# Patient Record
Sex: Male | Born: 1970 | Race: Black or African American | Hispanic: No | Smoking: Current every day smoker
Health system: Southern US, Community
[De-identification: ages and names within clinical notes are randomized; demographics above are authoritative.]

## PROBLEM LIST (undated history)

## (undated) DIAGNOSIS — M542 Cervicalgia: Secondary | ICD-10-CM

## (undated) DIAGNOSIS — I509 Heart failure, unspecified: Secondary | ICD-10-CM

## (undated) DIAGNOSIS — J449 Chronic obstructive pulmonary disease, unspecified: Secondary | ICD-10-CM

## (undated) DIAGNOSIS — F1010 Alcohol abuse, uncomplicated: Secondary | ICD-10-CM

## (undated) DIAGNOSIS — I10 Essential (primary) hypertension: Secondary | ICD-10-CM

## (undated) DIAGNOSIS — E785 Hyperlipidemia, unspecified: Secondary | ICD-10-CM

## (undated) DIAGNOSIS — R569 Unspecified convulsions: Secondary | ICD-10-CM

## (undated) HISTORY — PX: CHOLECYSTECTOMY: SHX55

## (undated) HISTORY — DX: Essential (primary) hypertension: I10

## (undated) HISTORY — PX: TONSILLECTOMY: SUR1361

## (undated) HISTORY — DX: Cervicalgia: M54.2

## (undated) HISTORY — DX: Alcohol abuse, uncomplicated: F10.10

## (undated) HISTORY — DX: Heart failure, unspecified: I50.9

## (undated) HISTORY — DX: Hyperlipidemia, unspecified: E78.5

## (undated) SURGERY — ECHOCARDIOGRAM, TRANSESOPHAGEAL
Anesthesia: Moderate Sedation

---

## 2004-12-05 ENCOUNTER — Emergency Department: Payer: Self-pay | Admitting: Unknown Physician Specialty

## 2006-10-04 ENCOUNTER — Emergency Department: Payer: Self-pay | Admitting: Emergency Medicine

## 2008-08-13 ENCOUNTER — Emergency Department: Payer: Self-pay | Admitting: Emergency Medicine

## 2010-01-22 ENCOUNTER — Emergency Department: Payer: Self-pay | Admitting: Emergency Medicine

## 2010-04-05 ENCOUNTER — Emergency Department: Payer: Self-pay | Admitting: Emergency Medicine

## 2010-06-25 ENCOUNTER — Ambulatory Visit: Payer: Self-pay | Admitting: Neurology

## 2011-05-15 ENCOUNTER — Emergency Department: Payer: Self-pay | Admitting: Unknown Physician Specialty

## 2013-04-08 ENCOUNTER — Ambulatory Visit: Payer: Self-pay

## 2013-04-22 DIAGNOSIS — M542 Cervicalgia: Secondary | ICD-10-CM | POA: Insufficient documentation

## 2015-04-30 DIAGNOSIS — G40909 Epilepsy, unspecified, not intractable, without status epilepticus: Secondary | ICD-10-CM

## 2015-04-30 DIAGNOSIS — E785 Hyperlipidemia, unspecified: Secondary | ICD-10-CM

## 2015-04-30 DIAGNOSIS — M545 Low back pain, unspecified: Secondary | ICD-10-CM | POA: Insufficient documentation

## 2015-04-30 DIAGNOSIS — F1010 Alcohol abuse, uncomplicated: Secondary | ICD-10-CM | POA: Insufficient documentation

## 2015-04-30 DIAGNOSIS — I10 Essential (primary) hypertension: Secondary | ICD-10-CM

## 2015-04-30 DIAGNOSIS — F17200 Nicotine dependence, unspecified, uncomplicated: Secondary | ICD-10-CM

## 2015-04-30 DIAGNOSIS — J449 Chronic obstructive pulmonary disease, unspecified: Secondary | ICD-10-CM | POA: Insufficient documentation

## 2015-04-30 DIAGNOSIS — R569 Unspecified convulsions: Secondary | ICD-10-CM | POA: Insufficient documentation

## 2015-04-30 DIAGNOSIS — G8929 Other chronic pain: Secondary | ICD-10-CM | POA: Insufficient documentation

## 2015-04-30 DIAGNOSIS — E7800 Pure hypercholesterolemia, unspecified: Secondary | ICD-10-CM | POA: Insufficient documentation

## 2015-04-30 DIAGNOSIS — J45909 Unspecified asthma, uncomplicated: Secondary | ICD-10-CM | POA: Insufficient documentation

## 2015-05-05 ENCOUNTER — Ambulatory Visit: Payer: Self-pay | Admitting: Unknown Physician Specialty

## 2015-07-07 ENCOUNTER — Other Ambulatory Visit: Payer: Self-pay | Admitting: Unknown Physician Specialty

## 2015-07-07 ENCOUNTER — Encounter: Payer: Self-pay | Admitting: Unknown Physician Specialty

## 2015-07-07 ENCOUNTER — Ambulatory Visit (INDEPENDENT_AMBULATORY_CARE_PROVIDER_SITE_OTHER): Payer: Managed Care, Other (non HMO) | Admitting: Unknown Physician Specialty

## 2015-07-07 VITALS — BP 137/84 | HR 91 | Temp 98.9°F | Ht 70.2 in | Wt 222.8 lb

## 2015-07-07 DIAGNOSIS — F17200 Nicotine dependence, unspecified, uncomplicated: Secondary | ICD-10-CM | POA: Diagnosis not present

## 2015-07-07 DIAGNOSIS — E785 Hyperlipidemia, unspecified: Secondary | ICD-10-CM

## 2015-07-07 DIAGNOSIS — I10 Essential (primary) hypertension: Secondary | ICD-10-CM | POA: Diagnosis not present

## 2015-07-07 LAB — LIPID PANEL PICCOLO, WAIVED
Chol/HDL Ratio Piccolo,Waive: 4.1 mg/dL
Cholesterol Piccolo, Waived: 233 mg/dL — ABNORMAL HIGH (ref <200)
HDL Chol Piccolo, Waived: 58 mg/dL (ref >59)
LDL Chol Calc Piccolo Waived: 103 mg/dL — ABNORMAL HIGH (ref <100)
Triglycerides Piccolo,Waived: 361 mg/dL — ABNORMAL HIGH (ref <150)
VLDL Chol Calc Piccolo,Waive: 72 mg/dL — ABNORMAL HIGH (ref <30)

## 2015-07-07 MED ORDER — LISINOPRIL-HYDROCHLOROTHIAZIDE 20-12.5 MG PO TABS: 1.0000 | ORAL_TABLET | Freq: Every day | ORAL | Status: DC

## 2015-07-07 MED ORDER — SIMVASTATIN 20 MG PO TABS: 20.0000 mg | ORAL_TABLET | Freq: Every day | ORAL | Status: DC

## 2015-07-07 NOTE — Progress Notes (Signed)
   BP 137/84 mmHg  Pulse 91  Temp(Src) 98.9 F (37.2 C)  Ht 5' 10.2" (1.783 m)  Wt 222 lb 12.8 oz (101.061 kg)  BMI 31.79 kg/m2  SpO2 98%   Subjective:    Patient ID: Jose Dike., male    DOB: 1971/04/15, 44 y.o.   MRN: [id number]  HPI: Jose Seubert. is a 44 y.o. male  Chief Complaint  Patient presents with  . Hyperlipidemia  . Hypertension   Hypertension/Hyperlipidemia: He was started on lisionpril/HCTZ20- 12.5mg  in May after experiencing an episode of dizziness and not being able to have dental work due to high blood pressure. He is compliant with medications. He states he is going to stop smoking today but may need some help. Cholesterol is improved today however he did not start a previous prescription for simvastatin due to no insurance. Will restart today.  Relevant past medical, surgical, family and social history reviewed and updated as indicated. Interim medical history since our last visit reviewed. Allergies and medications reviewed and updated.  Review of Systems  Constitutional: Negative.  Negative for fever, chills and fatigue.  HENT: Negative.   Respiratory: Negative.  Negative for cough, chest tightness, shortness of breath, wheezing and stridor.   Cardiovascular: Negative.  Negative for chest pain, palpitations and leg swelling.  Skin: Negative.  Negative for color change, pallor, rash and wound.  Psychiatric/Behavioral: Negative.  Negative for confusion, sleep disturbance and agitation. The patient is not nervous/anxious.     Per HPI unless specifically indicated above     Objective:    BP 137/84 mmHg  Pulse 91  Temp(Src) 98.9 F (37.2 C)  Ht 5' 10.2" (1.783 m)  Wt 222 lb 12.8 oz (101.061 kg)  BMI 31.79 kg/m2  SpO2 98%  Wt Readings from Last 3 Encounters:  07/07/15 222 lb 12.8 oz (101.061 kg)  04/07/15 222 lb (100.699 kg)    Physical Exam  Constitutional: He is oriented to person, place, and time. He appears well-developed and well-nourished. No  distress.  HENT:  Head: Normocephalic.  Cardiovascular: Normal rate and normal heart sounds.  Exam reveals no gallop and no friction rub.   No murmur heard. Pulmonary/Chest: Breath sounds normal. No respiratory distress. He has no wheezes. He has no rales. He exhibits no tenderness.  Neurological: He is alert and oriented to person, place, and time.  Skin: Skin is warm and dry. No rash noted. He is not diaphoretic. No erythema. No pallor.  Psychiatric: He has a normal mood and affect. His behavior is normal. Judgment and thought content normal.    No results found for this or any previous visit.    Assessment & Plan:   Problem List Items Addressed This Visit      Unprioritized   Hypertension    Controlled on Lisinopril/HCTZ -  20-12.5mg . Medication refill sent to pharmacy.      Relevant Medications   lisinopril-hydrochlorothiazide (PRINZIDE,ZESTORETIC) 20-12.5 MG per tablet   simvastatin (ZOCOR) 20 MG tablet   Tobacco dependence    Pt education on smoking cessation, benefits and resources.        Hyperlipidemia - Primary    Restarted on simvastatin 20mg  per ASCVD guidelines.      Relevant Medications   lisinopril-hydrochlorothiazide (PRINZIDE,ZESTORETIC) 20-12.5 MG per tablet   simvastatin (ZOCOR) 20 MG tablet   Other Relevant Orders   Lipid Panel Piccolo, Waived       Follow up plan: 6 months

## 2015-07-07 NOTE — Assessment & Plan Note (Signed)
Restarted on simvastatin 20mg  per ASCVD guidelines.

## 2015-07-07 NOTE — Assessment & Plan Note (Signed)
Pt education on smoking cessation, benefits and resources.

## 2015-07-07 NOTE — Assessment & Plan Note (Signed)
Controlled on Lisinopril/HCTZ -  20-12.5mg . Medication refill sent to pharmacy.

## 2015-07-08 ENCOUNTER — Other Ambulatory Visit: Payer: Self-pay | Admitting: Unknown Physician Specialty

## 2015-07-08 NOTE — Telephone Encounter (Signed)
Pt called stated he needs refill on Epitol stated the refill was supposed to be sent yesterday. Pharm is Pa[facility] nurse on [address]. Thanks.

## 2015-07-08 NOTE — Telephone Encounter (Signed)
Routing to provider  

## 2015-08-07 ENCOUNTER — Other Ambulatory Visit: Payer: Self-pay | Admitting: Unknown Physician Specialty

## 2016-06-24 ENCOUNTER — Ambulatory Visit: Payer: Managed Care, Other (non HMO) | Admitting: Unknown Physician Specialty

## 2016-06-28 ENCOUNTER — Ambulatory Visit (INDEPENDENT_AMBULATORY_CARE_PROVIDER_SITE_OTHER): Payer: Managed Care, Other (non HMO) | Admitting: Unknown Physician Specialty

## 2016-06-28 ENCOUNTER — Encounter: Payer: Self-pay | Admitting: Unknown Physician Specialty

## 2016-06-28 ENCOUNTER — Encounter: Payer: Self-pay | Admitting: Gastroenterology

## 2016-06-28 VITALS — BP 179/116 | HR 86 | Temp 98.7°F | Ht 70.5 in | Wt 228.2 lb

## 2016-06-28 DIAGNOSIS — K625 Hemorrhage of anus and rectum: Secondary | ICD-10-CM

## 2016-06-28 DIAGNOSIS — E785 Hyperlipidemia, unspecified: Secondary | ICD-10-CM

## 2016-06-28 DIAGNOSIS — I10 Essential (primary) hypertension: Secondary | ICD-10-CM | POA: Diagnosis not present

## 2016-06-28 DIAGNOSIS — Z800 Family history of malignant neoplasm of digestive organs: Secondary | ICD-10-CM | POA: Diagnosis not present

## 2016-06-28 DIAGNOSIS — E7800 Pure hypercholesterolemia, unspecified: Secondary | ICD-10-CM

## 2016-06-28 DIAGNOSIS — G40909 Epilepsy, unspecified, not intractable, without status epilepticus: Secondary | ICD-10-CM | POA: Diagnosis not present

## 2016-06-28 LAB — CBC WITH DIFFERENTIAL/PLATELET
Hematocrit: 35 % — ABNORMAL LOW (ref 37.5–51.0)
Hemoglobin: 11.6 g/dL — ABNORMAL LOW (ref 12.6–17.7)
Lymphocytes Absolute: 2.2 10*3/uL (ref 0.7–3.1)
Lymphs: 33 %
MCH: 27.1 pg (ref 26.6–33.0)
MCHC: 33.1 g/dL (ref 31.5–35.7)
MCV: 82 fL (ref 79–97)
MID (Absolute): 1 10*3/uL (ref 0.1–1.6)
MID: 15 %
Neutrophils Absolute: 3.7 10*3/uL (ref 1.4–7.0)
Neutrophils: 52 %
Platelets: 225 10*3/uL (ref 150–379)
RBC: 4.28 x10E6/uL (ref 4.14–5.80)
RDW: 19.2 % — ABNORMAL HIGH (ref 12.3–15.4)
WBC: 6.9 10*3/uL (ref 3.4–10.8)

## 2016-06-28 LAB — MICROALBUMIN, URINE WAIVED
Creatinine, Urine Waived: 300 mg/dL (ref 10–300)
Microalb, Ur Waived: 150 mg/L — ABNORMAL HIGH (ref 0–19)

## 2016-06-28 MED ORDER — SIMVASTATIN 20 MG PO TABS: 20.0000 mg | ORAL_TABLET | Freq: Every day | ORAL | 1 refills | Status: DC

## 2016-06-28 MED ORDER — LISINOPRIL-HYDROCHLOROTHIAZIDE 20-12.5 MG PO TABS: 1.0000 | ORAL_TABLET | Freq: Every day | ORAL | 1 refills | Status: DC

## 2016-06-28 MED ORDER — CARBAMAZEPINE 200 MG PO TABS: 200.0000 mg | ORAL_TABLET | Freq: Two times a day (BID) | ORAL | 3 refills | Status: DC

## 2016-06-28 NOTE — Assessment & Plan Note (Signed)
Severe at times.  Strong family history of colon cancer.  Refer to GI for further evaluation.  H/H is stable.

## 2016-06-28 NOTE — Assessment & Plan Note (Signed)
Restart medication

## 2016-06-28 NOTE — Assessment & Plan Note (Signed)
Restart Epitol

## 2016-06-28 NOTE — Progress Notes (Signed)
BP (!) 179/116 (BP Location: Left Arm, Cuff Size: Large)   Pulse 86   Temp 98.7 F (37.1 C)   Ht 5' 10.5" (1.791 m)   Wt 228 lb 3.2 oz (103.5 kg)   SpO2 96%   BMI 32.28 kg/m    Subjective:    Patient ID: Jose [name]., male    DOB: [birth year], 45 y.o.   MRN: [id number]  HPI: Revis Botz. is a 45 y.o. male  Chief Complaint  Patient presents with  . Hypertension  . Hyperlipidemia   Hypertension No medication for 2 1/2 weeks.   Average home BPs Not checking   No problems or lightheadedness other than once with bright sunlight No chest pain with exertion or shortness of breath No Edema   Hyperlipidemia Out of medications    No Muscle aches  Diet compliance: Exercise:  Rectal bleeding States he had heavy rectal bleeding 2 1/2 weeks ago.  States he has noticed some blood in stomach.  His mother was diagnosed with colon cancer at 70 and brother and great uncle died of it.  Several cousins also diagnosed.  Seizure disorder  Out of medication for over a year.  No seizures in the last year.  Last one 2 years ago.  Started when 16  Relevant past medical, surgical, family and social history reviewed and updated as indicated. Interim medical history since our last visit reviewed. Allergies and medications reviewed and updated.  Review of Systems  Per HPI unless specifically indicated above     Objective:    BP (!) 179/116 (BP Location: Left Arm, Cuff Size: Large)   Pulse 86   Temp 98.7 F (37.1 C)   Ht 5' 10.5" (1.791 m)   Wt 228 lb 3.2 oz (103.5 kg)   SpO2 96%   BMI 32.28 kg/m   Wt Readings from Last 3 Encounters:  06/28/16 228 lb 3.2 oz (103.5 kg)  07/07/15 222 lb 12.8 oz (101.1 kg)  04/07/15 222 lb (100.7 kg)    Physical Exam  Constitutional: He is oriented to person, place, and time. He appears well-developed and well-nourished. No distress.  HENT:  Head: Normocephalic and atraumatic.  Eyes: Conjunctivae and lids are normal. Right eye exhibits no  discharge. Left eye exhibits no discharge. No scleral icterus.  Neck: Normal range of motion. Neck supple. No JVD present. Carotid bruit is not present.  Cardiovascular: Normal rate, regular rhythm and normal heart sounds.   Pulmonary/Chest: Effort normal and breath sounds normal. No respiratory distress.  Abdominal: Normal appearance. There is no splenomegaly or hepatomegaly.  Musculoskeletal: Normal range of motion.  Neurological: He is alert and oriented to person, place, and time.  Skin: Skin is warm, dry and intact. No rash noted. No pallor.  Psychiatric: He has a normal mood and affect. His behavior is normal. Judgment and thought content normal.    Results for orders placed or performed in visit on 07/07/15  Lipid Panel Piccolo, [employer]  Result Value Ref Range   Cholesterol Piccolo, Waived 233 (H) <200 mg/dL   HDL Chol Piccolo, Waived 58 >59 mg/dL   Triglycerides Piccolo,Waived 361 (H) <150 mg/dL   Chol/HDL Ratio Piccolo,Waive 4.1 mg/dL   LDL Chol Calc Piccolo Waived 103 (H) <100 mg/dL   VLDL Chol Calc Piccolo,Waive 72 (H) <30 mg/dL      Assessment & Plan:   Problem List Items Addressed This Visit      Unprioritized   Epilepsy ([place])    Restart Epitol  Family history of colon cancer   Relevant Orders   Ambulatory referral to Gastroenterology   Hypercholesterolemia    Restart medication      Relevant Medications   lisinopril-hydrochlorothiazide (PRINZIDE,ZESTORETIC) 20-12.5 MG tablet   simvastatin (ZOCOR) 20 MG tablet   Hyperlipidemia    Very poor control today.  Restart medication.        Relevant Medications   lisinopril-hydrochlorothiazide (PRINZIDE,ZESTORETIC) 20-12.5 MG tablet   simvastatin (ZOCOR) 20 MG tablet   Other Relevant Orders   Lipid Panel w/o Chol/HDL Ratio   Microalbumin, Urine Waived   Uric acid   Hypertension - Primary   Relevant Medications   lisinopril-hydrochlorothiazide (PRINZIDE,ZESTORETIC) 20-12.5 MG tablet   simvastatin (ZOCOR)  20 MG tablet   Other Relevant Orders   Comprehensive metabolic panel   Rectal bleeding    Severe at times.  Strong family history of colon cancer.  Refer to GI for further evaluation.  H/H is stable.        Relevant Orders   CBC With Differential/Platelet   Ambulatory referral to Gastroenterology    Other Visit Diagnoses   None.      Follow up plan: Return in about 4 weeks (around 07/26/2016).

## 2016-06-28 NOTE — Assessment & Plan Note (Signed)
Very poor control today.  Restart medication.

## 2016-06-29 ENCOUNTER — Encounter: Payer: Self-pay | Admitting: Unknown Physician Specialty

## 2016-06-29 ENCOUNTER — Telehealth: Payer: Self-pay | Admitting: Gastroenterology

## 2016-06-29 LAB — COMPREHENSIVE METABOLIC PANEL
ALT: 11 IU/L (ref 0–44)
AST: 18 IU/L (ref 0–40)
Albumin/Globulin Ratio: 1.3 (ref 1.2–2.2)
Albumin: 3.9 g/dL (ref 3.5–5.5)
Alkaline Phosphatase: 88 IU/L (ref 39–117)
BUN/Creatinine Ratio: 14 (ref 9–20)
BUN: 11 mg/dL (ref 6–24)
Bilirubin Total: <0.2 mg/dL (ref 0.0–1.2)
CO2: 23 mmol/L (ref 18–29)
Calcium: 8.9 mg/dL (ref 8.7–10.2)
Chloride: 104 mmol/L (ref 96–106)
Creatinine, Ser: 0.8 mg/dL (ref 0.76–1.27)
GFR calc Af Amer: 124 mL/min/{1.73_m2} (ref >59)
GFR calc non Af Amer: 107 mL/min/{1.73_m2} (ref >59)
Globulin, Total: 3.1 g/dL (ref 1.5–4.5)
Glucose: 92 mg/dL (ref 65–99)
Potassium: 3.7 mmol/L (ref 3.5–5.2)
Sodium: 144 mmol/L (ref 134–144)
Total Protein: 7 g/dL (ref 6.0–8.5)

## 2016-06-29 LAB — LIPID PANEL W/O CHOL/HDL RATIO
Cholesterol, Total: 211 mg/dL — ABNORMAL HIGH (ref 100–199)
HDL: 47 mg/dL (ref >39)
LDL Calculated: 139 mg/dL — ABNORMAL HIGH (ref 0–99)
Triglycerides: 124 mg/dL (ref 0–149)
VLDL Cholesterol Cal: 25 mg/dL (ref 5–40)

## 2016-06-29 LAB — URIC ACID: Uric Acid: 7.2 mg/dL (ref 3.7–8.6)

## 2016-07-01 NOTE — Telephone Encounter (Signed)
The pt has been moved to Dr Carlean Purl for 07/18/16, Kristin Bruins will notify the pt of the appt change.

## 2016-07-01 NOTE — Telephone Encounter (Signed)
The pt has a strong family history of colon cancer.  H/H is stable.  Hgb is slightly low at 11.6 he has an appt for 09/05/16 with Dr Ardis Hughs.  [facility] has not other labs to compare to.  He has some rectal bleeding and has a follow up in 2 weeks with PCP for repeat labs.  The pt has been added to the wait list and future labs will be sent to our office for upcoming appt.  Please advise if pt is ok to wait until appt scheduled with Dr Ardis Hughs.

## 2016-07-01 NOTE — Telephone Encounter (Signed)
Please see if there are any sooner available appts with any MD provider before 10/30 and offer to the patient.

## 2016-07-06 ENCOUNTER — Inpatient Hospital Stay: Payer: Managed Care, Other (non HMO) | Admitting: Family Medicine

## 2016-07-13 ENCOUNTER — Ambulatory Visit (INDEPENDENT_AMBULATORY_CARE_PROVIDER_SITE_OTHER): Payer: Managed Care, Other (non HMO) | Admitting: Family Medicine

## 2016-07-13 ENCOUNTER — Encounter: Payer: Self-pay | Admitting: Family Medicine

## 2016-07-13 VITALS — BP 144/92 | HR 82 | Temp 98.8°F | Wt 222.0 lb

## 2016-07-13 DIAGNOSIS — I10 Essential (primary) hypertension: Secondary | ICD-10-CM | POA: Diagnosis not present

## 2016-07-13 DIAGNOSIS — F17200 Nicotine dependence, unspecified, uncomplicated: Secondary | ICD-10-CM

## 2016-07-13 DIAGNOSIS — F1010 Alcohol abuse, uncomplicated: Secondary | ICD-10-CM | POA: Diagnosis not present

## 2016-07-13 DIAGNOSIS — S0990XD Unspecified injury of head, subsequent encounter: Secondary | ICD-10-CM | POA: Diagnosis not present

## 2016-07-13 MED ORDER — AMLODIPINE BESYLATE 5 MG PO TABS: 5.0000 mg | ORAL_TABLET | Freq: Every day | ORAL | 1 refills | Status: DC

## 2016-07-13 NOTE — Assessment & Plan Note (Signed)
Continue lisinopril HCTZ, add amlodipine. Has appt scheduled for a few weeks from now already, can recheck BP at this time.

## 2016-07-13 NOTE — Assessment & Plan Note (Signed)
Working on quitting with help from his personal support system. He knows to let us know if he needs any additional support in this process

## 2016-07-13 NOTE — Patient Instructions (Addendum)
Follow up as scheduled.  

## 2016-07-13 NOTE — Assessment & Plan Note (Signed)
Is planning to try to quit smoking in the near future. Has a quit hotline number that he wants to use for support.

## 2016-07-13 NOTE — Progress Notes (Signed)
BP (!) 144/92   Pulse 82   Temp 98.8 F (37.1 C)   Wt 222 lb (100.7 kg)   SpO2 97%   BMI 31.40 kg/m    Subjective:    Patient ID: Jose Dike., male    DOB: 02/21/1971, 45 y.o.   MRN: [id number]  HPI: Jose Cord. is a 45 y.o. male  Chief Complaint  Patient presents with  . Hospitalization Follow-up    s/p fall down stairs, had a head laceration. eval for staple removal.  . Alcohol Problem   Patient presents for hospital follow up regarding a fall down a flight of stairs with head laceration. He had staples placed on the largest laceration. Wounds are healing well, dressing them with bactroban. Denies dizziness, memory issues, N/V, or visual issues. Still having hearing loss on right side but was told that should come back over time.  BPs have been running high lately, notably in the hospital. Taking lisinopril HCTZ faithfully and without side effects.  Long-standing issue with alcohol and cigarette abuse, patient is at a place now where he is ready for a life change and seeking support from his personal network to get better.   Relevant past medical, surgical, family and social history reviewed and updated as indicated. Interim medical history since our last visit reviewed. Allergies and medications reviewed and updated.  Review of Systems  Constitutional: Negative.   HENT: Positive for hearing loss.   Respiratory: Negative.   Cardiovascular: Negative.   Gastrointestinal: Negative.   Genitourinary: Negative.   Musculoskeletal: Positive for arthralgias and myalgias.  Skin: Positive for wound.  Neurological: Negative.   Psychiatric/Behavioral: Negative.     Per HPI unless specifically indicated above     Objective:    BP (!) 144/92   Pulse 82   Temp 98.8 F (37.1 C)   Wt 222 lb (100.7 kg)   SpO2 97%   BMI 31.40 kg/m   Wt Readings from Last 3 Encounters:  07/13/16 222 lb (100.7 kg)  06/28/16 228 lb 3.2 oz (103.5 kg)  07/07/15 222 lb 12.8 oz (101.1 kg)      Physical Exam  Constitutional: He is oriented to person, place, and time. He appears well-developed and well-nourished. No distress.  HENT:  Head: Atraumatic.  Hearing diminished on right  Eyes: Conjunctivae are normal. No scleral icterus.  Neck: Normal range of motion. Neck supple.  Cardiovascular: Normal rate.   Pulmonary/Chest: Effort normal. No respiratory distress.  Musculoskeletal: Normal range of motion.  Neurological: He is alert and oriented to person, place, and time. No cranial nerve deficit. He exhibits normal muscle tone.  Skin: Skin is warm and dry.  Wounds on head healing well with no purulent drainage or erythema.  Staples on scalp intact, laceration healing well  Psychiatric: He has a normal mood and affect. His behavior is normal.  Nursing note and vitals reviewed.     Assessment & Plan:   Problem List Items Addressed This Visit      Cardiovascular and Mediastinum   Hypertension    Continue lisinopril HCTZ, add amlodipine. Has appt scheduled for a few weeks from now already, can recheck BP at this time.       Relevant Medications   amLODipine (NORVASC) 5 MG tablet     Other   ETOH abuse    Working on quitting with help from his personal support system. He knows to let us know if he needs any additional support in this process  Tobacco dependence    Is planning to try to quit smoking in the near future. Has a quit hotline number that he wants to use for support.        Other Visit Diagnoses    Head injury, subsequent encounter    -  Primary   Recovering well, continue bactroban on wounds. Staples can be removed at [facility] f/u Monday.        Follow up plan: Return for as scheduled, BP check.

## 2016-07-18 ENCOUNTER — Ambulatory Visit: Payer: Self-pay | Admitting: Internal Medicine

## 2016-07-21 ENCOUNTER — Ambulatory Visit: Payer: Managed Care, Other (non HMO) | Admitting: Family Medicine

## 2016-07-21 ENCOUNTER — Encounter: Payer: Self-pay | Admitting: Family Medicine

## 2016-07-21 ENCOUNTER — Ambulatory Visit (INDEPENDENT_AMBULATORY_CARE_PROVIDER_SITE_OTHER): Payer: Managed Care, Other (non HMO) | Admitting: Family Medicine

## 2016-07-21 VITALS — BP 145/96 | HR 88 | Temp 98.6°F | Ht 69.5 in | Wt 222.8 lb

## 2016-07-21 DIAGNOSIS — Z4802 Encounter for removal of sutures: Secondary | ICD-10-CM | POA: Diagnosis not present

## 2016-07-21 DIAGNOSIS — I10 Essential (primary) hypertension: Secondary | ICD-10-CM

## 2016-07-21 NOTE — Progress Notes (Signed)
BP (!) 145/96 (BP Location: Left Arm, Cuff Size: Large)   Pulse 88   Temp 98.6 F (37 C)   Ht 5' 9.5" (1.765 m)   Wt 222 lb 12.8 oz (101.1 kg)   SpO2 96%   BMI 32.43 kg/m    Subjective:    Patient ID: Jose Dike., male    DOB: August 12, 1971, 45 y.o.   MRN: [id number]  HPI: Jose Wildy. is a 45 y.o. male  Chief Complaint  Patient presents with  . Suture / Staple Removal   Patient presents s/p hospitalization for fall down stairs for staple removal, right scalp. Staples placed 8/27 in ER. Patient has been performing appropriate wound care and has not had any issues with the area. No HAs, dizziness, or N/V since accident.   Elevated BP discussed at previous visit, and amlodipine was ordered. He forgot to pick the medicine up and also forgot to take his other BP medication this morning and has been rushing around.   Relevant past medical, surgical, family and social history reviewed and updated as indicated. Interim medical history since our last visit reviewed. Allergies and medications reviewed and updated.  Review of Systems  Constitutional: Negative.   HENT: Positive for hearing loss.   Respiratory: Negative.   Cardiovascular: Negative.   Gastrointestinal: Negative.   Genitourinary: Negative.   Musculoskeletal: Negative.   Skin: Positive for wound.  Neurological: Negative.   Psychiatric/Behavioral: Negative.     Per HPI unless specifically indicated above     Objective:    BP (!) 145/96 (BP Location: Left Arm, Cuff Size: Large)   Pulse 88   Temp 98.6 F (37 C)   Ht 5' 9.5" (1.765 m)   Wt 222 lb 12.8 oz (101.1 kg)   SpO2 96%   BMI 32.43 kg/m   Wt Readings from Last 3 Encounters:  07/21/16 222 lb 12.8 oz (101.1 kg)  07/13/16 222 lb (100.7 kg)  06/28/16 228 lb 3.2 oz (103.5 kg)    Physical Exam  Constitutional: He is oriented to person, place, and time. He appears well-developed and well-nourished.  HENT:  Right Ear: External ear normal.  Left Ear: External  ear normal.  Eyes: Conjunctivae are normal. Pupils are equal, round, and reactive to light. No scleral icterus.  Neck: Normal range of motion. Neck supple.  Cardiovascular: Normal rate and normal heart sounds.   Pulmonary/Chest: Effort normal. No respiratory distress.  Musculoskeletal: Normal range of motion.  Neurological: He is alert and oriented to person, place, and time.  Skin: Skin is warm and dry.  Scalp lac well healed, 5 staples intact  No evidence of cellulitis  Psychiatric: He has a normal mood and affect. His behavior is normal.  Nursing note and vitals reviewed.       Assessment & Plan:   Problem List Items Addressed This Visit      Cardiovascular and Mediastinum   Hypertension    Discussed importance of picking up the amlodipine right away so we have an accurate picture of his response by his follow up in a few weeks. Continue lisinopril HCTZ.        Other Visit Diagnoses    Encounter for staple removal    -  Primary   Staples removed from right scalp with no immediate complications. Procedure was well tolerated. Area was dressed with neosporin and bandage.        Follow up plan: Return for as scheduled for BP recheck.

## 2016-07-21 NOTE — Patient Instructions (Signed)
Follow up as scheduled.  

## 2016-07-21 NOTE — Assessment & Plan Note (Signed)
Discussed importance of picking up the amlodipine right away so we have an accurate picture of his response by his follow up in a few weeks. Continue lisinopril HCTZ.

## 2016-08-01 ENCOUNTER — Ambulatory Visit: Payer: Managed Care, Other (non HMO) | Admitting: Unknown Physician Specialty

## 2016-09-05 ENCOUNTER — Ambulatory Visit: Payer: Self-pay | Admitting: Gastroenterology

## 2016-09-27 ENCOUNTER — Ambulatory Visit: Payer: Self-pay | Admitting: Internal Medicine

## 2016-12-18 ENCOUNTER — Other Ambulatory Visit: Payer: Self-pay | Admitting: Unknown Physician Specialty

## 2016-12-19 ENCOUNTER — Other Ambulatory Visit: Payer: Self-pay | Admitting: Unknown Physician Specialty

## 2016-12-28 ENCOUNTER — Other Ambulatory Visit: Payer: Self-pay | Admitting: Unknown Physician Specialty

## 2016-12-29 NOTE — Telephone Encounter (Signed)
Needs seen

## 2016-12-29 NOTE — Telephone Encounter (Signed)
Letter generated and sent to patient.  

## 2017-01-09 ENCOUNTER — Encounter: Payer: Self-pay | Admitting: Unknown Physician Specialty

## 2017-01-09 ENCOUNTER — Ambulatory Visit (INDEPENDENT_AMBULATORY_CARE_PROVIDER_SITE_OTHER): Payer: Managed Care, Other (non HMO) | Admitting: Unknown Physician Specialty

## 2017-01-09 DIAGNOSIS — E785 Hyperlipidemia, unspecified: Secondary | ICD-10-CM | POA: Diagnosis not present

## 2017-01-09 DIAGNOSIS — I10 Essential (primary) hypertension: Secondary | ICD-10-CM | POA: Diagnosis not present

## 2017-01-09 DIAGNOSIS — F17200 Nicotine dependence, unspecified, uncomplicated: Secondary | ICD-10-CM | POA: Diagnosis not present

## 2017-01-09 MED ORDER — AMLODIPINE BESYLATE 5 MG PO TABS: 5.0000 mg | ORAL_TABLET | Freq: Every day | ORAL | 1 refills | Status: DC

## 2017-01-09 MED ORDER — CARBAMAZEPINE 200 MG PO TABS: 200.0000 mg | ORAL_TABLET | Freq: Two times a day (BID) | ORAL | 1 refills | Status: DC

## 2017-01-09 MED ORDER — SIMVASTATIN 20 MG PO TABS: 20.0000 mg | ORAL_TABLET | Freq: Every day | ORAL | 1 refills | Status: DC

## 2017-01-09 MED ORDER — LISINOPRIL-HYDROCHLOROTHIAZIDE 20-12.5 MG PO TABS: 1.0000 | ORAL_TABLET | Freq: Every day | ORAL | 1 refills | Status: DC

## 2017-01-09 NOTE — Patient Instructions (Signed)
Quit smoking class  [phone] 

## 2017-01-09 NOTE — Progress Notes (Signed)
BP (!) 161/98 (BP Location: Left Arm, Cuff Size: Large)   Pulse 90   Temp 99.2 F (37.3 C)   Ht 5' 10.2" (1.783 m) Comment: pt had boots on  Wt 233 lb 11.2 oz (106 kg) Comment: pt had boots on  SpO2 94%   BMI 33.34 kg/m    Subjective:    Patient ID: Jose Dike., male    DOB: Oct 24, 1971, 46 y.o.   MRN: [id number]  HPI: Jose Machin. is a 46 y.o. male  Chief Complaint  Patient presents with  . Hyperlipidemia  . Hypertension  . Medication Refill    pt states he needs all medications refilled, states he has been out of all of them for 2 days    Hypertension Out of medications for 3 days Average home BPs Not checking   No problems or lightheadedness No chest pain with exertion or shortness of breath No Edema   Hyperlipidemia Using medications without problems No Muscle aches  Diet compliance: lately eating more fruit.  Eats once a day Exercise: Constantly active at work  Smoking New objective is to quit.  Cut back to less than 1/2 a pack a day.   Relevant past medical, surgical, family and social history reviewed and updated as indicated. Interim medical history since our last visit reviewed. Allergies and medications reviewed and updated.  Review of Systems  Per HPI unless specifically indicated above     Objective:    BP (!) 161/98 (BP Location: Left Arm, Cuff Size: Large)   Pulse 90   Temp 99.2 F (37.3 C)   Ht 5' 10.2" (1.783 m) Comment: pt had boots on  Wt 233 lb 11.2 oz (106 kg) Comment: pt had boots on  SpO2 94%   BMI 33.34 kg/m   Wt Readings from Last 3 Encounters:  01/09/17 233 lb 11.2 oz (106 kg)  07/21/16 222 lb 12.8 oz (101.1 kg)  07/13/16 222 lb (100.7 kg)    Physical Exam  Constitutional: He is oriented to person, place, and time. He appears well-developed and well-nourished. No distress.  HENT:  Head: Normocephalic and atraumatic.  Eyes: Conjunctivae and lids are normal. Right eye exhibits no discharge. Left eye exhibits no discharge. No  scleral icterus.  Neck: Normal range of motion. Neck supple. No JVD present. Carotid bruit is not present.  Cardiovascular: Normal rate, regular rhythm and normal heart sounds.   Pulmonary/Chest: Effort normal and breath sounds normal. No respiratory distress.  Abdominal: Normal appearance. There is no splenomegaly or hepatomegaly.  Musculoskeletal: Normal range of motion.  Neurological: He is alert and oriented to person, place, and time.  Skin: Skin is warm, dry and intact. No rash noted. No pallor.  Psychiatric: He has a normal mood and affect. His behavior is normal. Judgment and thought content normal.    Results for orders placed or performed in visit on 06/28/16  Comprehensive metabolic panel  Result Value Ref Range   Glucose 92 65 - 99 mg/dL   BUN 11 6 - 24 mg/dL   Creatinine, Ser 0.80 0.76 - 1.27 mg/dL   GFR calc non Af Amer 107 >59 mL/min/1.73   GFR calc Af Amer 124 >59 mL/min/1.73   BUN/Creatinine Ratio 14 9 - 20   Sodium 144 134 - 144 mmol/L   Potassium 3.7 3.5 - 5.2 mmol/L   Chloride 104 96 - 106 mmol/L   CO2 23 18 - 29 mmol/L   Calcium 8.9 8.7 - 10.2 mg/dL  Total Protein 7.0 6.0 - 8.5 g/dL   Albumin 3.9 3.5 - 5.5 g/dL   Globulin, Total 3.1 1.5 - 4.5 g/dL   Albumin/Globulin Ratio 1.3 1.2 - 2.2   Bilirubin Total <0.2 0.0 - 1.2 mg/dL   Alkaline Phosphatase 88 39 - 117 IU/L   AST 18 0 - 40 IU/L   ALT 11 0 - 44 IU/L  Lipid Panel w/o Chol/HDL Ratio  Result Value Ref Range   Cholesterol, Total 211 (H) 100 - 199 mg/dL   Triglycerides 124 0 - 149 mg/dL   HDL 47 >39 mg/dL   VLDL Cholesterol Cal 25 5 - 40 mg/dL   LDL Calculated 139 (H) 0 - 99 mg/dL  Microalbumin, Urine Waived  Result Value Ref Range   Microalb, Ur Waived 150 (H) 0 - 19 mg/L   Creatinine, Urine Waived 300 10 - 300 mg/dL   Microalb/Creat Ratio 30-300 (H) <30 mg/g  Uric acid  Result Value Ref Range   Uric Acid 7.2 3.7 - 8.6 mg/dL  CBC With Differential/Platelet  Result Value Ref Range   WBC 6.9 3.4  - 10.8 x10E3/uL   RBC 4.28 4.14 - 5.80 x10E6/uL   Hemoglobin 11.6 (L) 12.6 - 17.7 g/dL   Hematocrit 35.0 (L) 37.5 - 51.0 %   MCV 82 79 - 97 fL   MCH 27.1 26.6 - 33.0 pg   MCHC 33.1 31.5 - 35.7 g/dL   RDW 19.2 (H) 12.3 - 15.4 %   Platelets 225 150 - 379 x10E3/uL   Neutrophils 52 %   Lymphs 33 %   MID 15 %   Neutrophils Absolute 3.7 1.4 - 7.0 x10E3/uL   Lymphocytes Absolute 2.2 0.7 - 3.1 x10E3/uL   MID (Absolute) 1.0 0.1 - 1.6 X10E3/uL      Assessment & Plan:   Problem List Items Addressed This Visit      Unprioritized   Hyperlipidemia    Restart meds.  Recheck 1 month      Relevant Medications   amLODipine (NORVASC) 5 MG tablet   simvastatin (ZOCOR) 20 MG tablet   lisinopril-hydrochlorothiazide (PRINZIDE,ZESTORETIC) 20-12.5 MG tablet   Hypertension    Restart meds.  Recheck in 1 month       Relevant Medications   amLODipine (NORVASC) 5 MG tablet   simvastatin (ZOCOR) 20 MG tablet   lisinopril-hydrochlorothiazide (PRINZIDE,ZESTORETIC) 20-12.5 MG tablet   Tobacco dependence    Working on quitting.  Quit smoking class number given.            Follow up plan: Return in about 4 weeks (around 02/06/2017).

## 2017-01-09 NOTE — Assessment & Plan Note (Signed)
Working on quitting.  Quit smoking class number given.

## 2017-01-09 NOTE — Assessment & Plan Note (Addendum)
Restart meds.  Recheck in 1 month

## 2017-01-09 NOTE — Assessment & Plan Note (Signed)
Restart meds.  Recheck 1 month

## 2017-02-07 ENCOUNTER — Ambulatory Visit: Payer: Managed Care, Other (non HMO) | Admitting: Unknown Physician Specialty

## 2017-03-20 ENCOUNTER — Other Ambulatory Visit: Payer: Self-pay | Admitting: Unknown Physician Specialty

## 2017-03-20 MED ORDER — CARBAMAZEPINE 200 MG PO TABS: 200.0000 mg | ORAL_TABLET | Freq: Two times a day (BID) | ORAL | 1 refills | Status: DC

## 2017-03-20 MED ORDER — LISINOPRIL-HYDROCHLOROTHIAZIDE 20-12.5 MG PO TABS: 1.0000 | ORAL_TABLET | Freq: Every day | ORAL | 1 refills | Status: DC

## 2017-03-20 NOTE — Telephone Encounter (Signed)
Patient called in regards to requesting a medication refill for lisinopril and epitol. Patient wanted to know if he needs to make an appointment or if he can just have his medications refilled.   Please Advise.  Thank you.

## 2017-03-20 NOTE — Telephone Encounter (Signed)
Routing to provider  

## 2017-03-20 NOTE — Telephone Encounter (Signed)
According to my note, he was supposed to have a 4 week f/u?Marland Kitchen  But here are his refills

## 2017-03-21 NOTE — Telephone Encounter (Signed)
Letter generated and sent to patient about follow up appointment.

## 2017-06-09 ENCOUNTER — Other Ambulatory Visit: Payer: Self-pay | Admitting: Unknown Physician Specialty

## 2017-06-09 MED ORDER — CARBAMAZEPINE 200 MG PO TABS: 200.0000 mg | ORAL_TABLET | Freq: Two times a day (BID) | ORAL | 1 refills | Status: DC

## 2017-06-09 MED ORDER — LISINOPRIL-HYDROCHLOROTHIAZIDE 20-12.5 MG PO TABS: 1.0000 | ORAL_TABLET | Freq: Every day | ORAL | 1 refills | Status: DC

## 2017-06-09 NOTE — Telephone Encounter (Signed)
Routing to provider. Patient last seen 01/09/17.

## 2017-06-09 NOTE — Telephone Encounter (Signed)
Pt would like a refill for epitol and lisinopril sent to [facility] [place].

## 2017-07-07 ENCOUNTER — Telehealth: Payer: Self-pay | Admitting: Unknown Physician Specialty

## 2017-07-07 NOTE — Telephone Encounter (Signed)
Called pharmacy because according to chart, patient should still have a refill on both of these medications. Pharmacy stated that they do each have a refill and that they will get them ready for the patient. Will call and let patient know.

## 2017-07-07 NOTE — Telephone Encounter (Signed)
Called and let patient know that he still had a refill on his medications and that they were getting them ready for him.

## 2017-12-21 ENCOUNTER — Emergency Department
Admission: EM | Admit: 2017-12-21 | Discharge: 2017-12-21 | Disposition: A | Discharge status: Home / Self Care | Payer: Self-pay | Attending: Emergency Medicine | Admitting: Emergency Medicine

## 2017-12-21 ENCOUNTER — Other Ambulatory Visit: Payer: Self-pay

## 2017-12-21 ENCOUNTER — Encounter: Payer: Self-pay | Admitting: Emergency Medicine

## 2017-12-21 DIAGNOSIS — I10 Essential (primary) hypertension: Secondary | ICD-10-CM | POA: Insufficient documentation

## 2017-12-21 DIAGNOSIS — Z79899 Other long term (current) drug therapy: Secondary | ICD-10-CM | POA: Insufficient documentation

## 2017-12-21 DIAGNOSIS — J45909 Unspecified asthma, uncomplicated: Secondary | ICD-10-CM | POA: Insufficient documentation

## 2017-12-21 DIAGNOSIS — F17210 Nicotine dependence, cigarettes, uncomplicated: Secondary | ICD-10-CM | POA: Insufficient documentation

## 2017-12-21 DIAGNOSIS — K922 Gastrointestinal hemorrhage, unspecified: Secondary | ICD-10-CM | POA: Insufficient documentation

## 2017-12-21 DIAGNOSIS — F10920 Alcohol use, unspecified with intoxication, uncomplicated: Secondary | ICD-10-CM | POA: Insufficient documentation

## 2017-12-21 HISTORY — DX: Unspecified convulsions: R56.9

## 2017-12-21 LAB — CBC
HCT: 32.6 % — ABNORMAL LOW (ref 40.0–52.0)
Hemoglobin: 9.9 g/dL — ABNORMAL LOW (ref 13.0–18.0)
MCH: 20.8 pg — ABNORMAL LOW (ref 26.0–34.0)
MCHC: 30.3 g/dL — ABNORMAL LOW (ref 32.0–36.0)
MCV: 68.8 fL — ABNORMAL LOW (ref 80.0–100.0)
Platelets: 303 10*3/uL (ref 150–440)
RBC: 4.74 MIL/uL (ref 4.40–5.90)
RDW: 22.5 % — ABNORMAL HIGH (ref 11.5–14.5)
WBC: 4.3 10*3/uL (ref 3.8–10.6)

## 2017-12-21 LAB — BASIC METABOLIC PANEL
Anion gap: 14 (ref 5–15)
BUN: 11 mg/dL (ref 6–20)
CO2: 23 mmol/L (ref 22–32)
Calcium: 9.4 mg/dL (ref 8.9–10.3)
Chloride: 104 mmol/L (ref 101–111)
Creatinine, Ser: 0.75 mg/dL (ref 0.61–1.24)
GFR calc Af Amer: >60 mL/min (ref >60)
GFR calc non Af Amer: >60 mL/min (ref >60)
Glucose, Bld: 101 mg/dL — ABNORMAL HIGH (ref 65–99)
Potassium: 3.2 mmol/L — ABNORMAL LOW (ref 3.5–5.1)
Sodium: 141 mmol/L (ref 135–145)

## 2017-12-21 LAB — HEMOGLOBIN AND HEMATOCRIT, BLOOD
HCT: 31.8 % — ABNORMAL LOW (ref 40.0–52.0)
Hemoglobin: 9.8 g/dL — ABNORMAL LOW (ref 13.0–18.0)

## 2017-12-21 LAB — TROPONIN I: Troponin I: <0.03 ng/mL (ref <0.03)

## 2017-12-21 LAB — ETHANOL: Alcohol, Ethyl (B): 118 mg/dL — ABNORMAL HIGH (ref <10)

## 2017-12-21 MED ORDER — AMLODIPINE BESYLATE 5 MG PO TABS: 5.0000 mg | ORAL_TABLET | Freq: Every day | ORAL | 1 refills | Status: DC

## 2017-12-21 MED ORDER — CARBAMAZEPINE 200 MG PO TABS
200.0000 mg | ORAL_TABLET | Freq: Once | ORAL | Status: AC
  Administered 2017-12-21: 200 mg via ORAL
  Filled 2017-12-21: qty 1

## 2017-12-21 MED ORDER — HYDROCHLOROTHIAZIDE 12.5 MG PO CAPS
12.5000 mg | ORAL_CAPSULE | Freq: Every day | ORAL | Status: DC
  Administered 2017-12-21: 12.5 mg via ORAL
  Filled 2017-12-21: qty 1

## 2017-12-21 MED ORDER — HYDROCHLOROTHIAZIDE 12.5 MG PO TABS: 12.5000 mg | ORAL_TABLET | Freq: Every day | ORAL | 1 refills | Status: DC

## 2017-12-21 MED ORDER — SODIUM CHLORIDE 0.9 % IV BOLUS (SEPSIS)
1000.0000 mL | Freq: Once | INTRAVENOUS | Status: AC
  Administered 2017-12-21: 1000 mL via INTRAVENOUS

## 2017-12-21 MED ORDER — LISINOPRIL 10 MG PO TABS
20.0000 mg | ORAL_TABLET | Freq: Once | ORAL | Status: AC
Start: 2017-12-21 — End: 2017-12-21
  Administered 2017-12-21: 20 mg via ORAL
  Filled 2017-12-21: qty 2

## 2017-12-21 MED ORDER — AMLODIPINE BESYLATE 5 MG PO TABS
5.0000 mg | ORAL_TABLET | Freq: Once | ORAL | Status: AC
  Administered 2017-12-21: 5 mg via ORAL
  Filled 2017-12-21: qty 1

## 2017-12-21 MED ORDER — LISINOPRIL 10 MG PO TABS: 20.0000 mg | ORAL_TABLET | Freq: Every day | ORAL | 11 refills | Status: DC

## 2017-12-21 MED ORDER — CARBAMAZEPINE ER 200 MG PO TB12: 200.0000 mg | ORAL_TABLET | Freq: Two times a day (BID) | ORAL | 1 refills | Status: DC

## 2017-12-21 MED ORDER — AMLODIPINE BESYLATE 5 MG PO TABS
ORAL_TABLET | ORAL | Status: AC
  Administered 2017-12-21: 5 mg via ORAL
  Filled 2017-12-21: qty 1

## 2017-12-21 NOTE — Discharge Instructions (Signed)
Return to the emergency room if you develop black stools or bloody stools, if you feel dizzy, you develop chest pain, or if you develop bloody vomit. Otherwise follow-up with your primary care doctor in 1-2 days for reevaluation. Make sure to take your medications as prescribed.

## 2017-12-21 NOTE — ED Triage Notes (Signed)
Pt to ED via EMS from work, pt was found in car unresponsive, per EMS pt was A&Ox4 upon arrival. Pt hx of seizures, HTN. Pt noncompliant with meds x65month. Pt ETOH use q daily. MD at bedside. Pt A&Ox4, VS stable, speech clear

## 2017-12-21 NOTE — ED Provider Notes (Signed)
[facility] Emergency Department Provider Note  ____________________________________________  Time seen: Approximately 8:36 AM  I have reviewed the triage vital signs and the nursing notes.   HISTORY  Chief Complaint Loss of Consciousness  Level 5 caveat:  Portions of the history and physical were unable to be obtained due to confusion   HPI Jose [name]. is a 47 y.o. male with a history of alcohol abuse, hypertension, hyperlipidemia, and seizure disorder who presents after being found unresponsive in his car. Patient reports that he drinks every night and and drank last night. He hasn't been taking his medications for 2 months. Patient lost his wife this past December to sickle cell. His been very sad. He denies suicidal or homicidal ideation or history of depression. He reports that he ran out of his medications 2 months ago and due to a recent change of insurance and his job he still does not have his card and has been unable to afford his medications. This morning he was found in his car. Unclear if patient had lost consciousness or if he was sleeping. Patient was alert and oriented when he started to respond to EMS knocking on his window. Patient denies headache, nausea, vomiting, diarrhea going to display ago of shortness of breath, abdominal pain, fever, chills. Patient remembers driving to work but does not remember why he was in the car.  Past Medical History:  Diagnosis Date  . Alcohol abuse   . Cervicalgia   . Hyperlipidemia   . Hypertension   . Seizures [facility])     Patient Active Problem List   Diagnosis Date Noted  . Family history of colon cancer 06/28/2016  . Rectal bleeding 06/28/2016  . Seizures ([place]) 04/30/2015  . Hypertension 04/30/2015  . ETOH abuse 04/30/2015  . Epilepsy ([place]) 04/30/2015  . Tobacco dependence 04/30/2015  . COPD (chronic obstructive pulmonary disease) ([place]) 04/30/2015  . Asthma 04/30/2015  . Hyperlipidemia 04/30/2015   . Lumbago 04/30/2015  . Hypercholesterolemia 04/30/2015  . Chronic pain 04/30/2015    History reviewed. No pertinent surgical history.  Prior to Admission medications   Medication Sig Start Date End Date Taking? Authorizing Provider  amLODipine (NORVASC) 5 MG tablet Take 1 tablet (5 mg total) by mouth daily. 12/21/17 12/21/18  Rudene Re, MD  carbamazepine (TEGRETOL-XR) 200 MG 12 hr tablet Take 1 tablet (200 mg total) by mouth 2 (two) times daily. 12/21/17 12/21/18  Rudene Re, MD  hydrochlorothiazide (HYDRODIURIL) 12.5 MG tablet Take 1 tablet (12.5 mg total) by mouth daily. 12/21/17   Rudene Re, MD  lisinopril (PRINIVIL,ZESTRIL) 10 MG tablet Take 2 tablets (20 mg total) by mouth daily. 12/21/17 12/21/18  Rudene Re, MD  simvastatin (ZOCOR) 20 MG tablet Take 1 tablet (20 mg total) by mouth daily. Patient not taking: Reported on 12/21/2017 01/09/17   Kathrine Haddock, NP    Allergies Patient has no known allergies.  Family History  Problem Relation Age of Onset  . Diabetes Mother   . Hypertension Father   . Multiple sclerosis Father     Social History Social History   Tobacco Use  . Smoking status: Current Every Day Smoker    Packs/day: 0.50  . Smokeless tobacco: Former Network engineer Use Topics  . Alcohol use: Yes    Alcohol/week: 12.6 oz    Types: 14 Cans of beer, 7 Shots of liquor per week  . Drug use: Yes    Types: Marijuana    Review of Systems  Constitutional:  Negative for fever. + confusion Eyes: Negative for visual changes. ENT: Negative for sore throat. Neck: No neck pain  Cardiovascular: Negative for chest pain. Respiratory: Negative for shortness of breath. Gastrointestinal: Negative for abdominal pain, vomiting or diarrhea. Genitourinary: Negative for dysuria. Musculoskeletal: Negative for back pain. Skin: Negative for rash. Neurological: Negative for headaches, weakness or numbness. Psych: No SI or  HI  ____________________________________________   PHYSICAL EXAM:  VITAL SIGNS: ED Triage Vitals [12/21/17 0820]  Enc Vitals Group     BP (!) 181/110     Pulse Rate 100     Resp 20     Temp      Temp src      SpO2 98 %     Weight      Height      Head Circumference      Peak Flow      Pain Score 0     Pain Loc      Pain Edu?      Excl. in [place]?     Constitutional: Alert and oriented x 3. Well appearing and in no apparent distress. Patient smells of alcohol HEENT:      Head: Normocephalic and atraumatic.         Eyes: Conjunctivae are normal. Sclera is non-icteric.       Mouth/Throat: Mucous membranes are moist.       Neck: Supple with no signs of meningismus. Cardiovascular: Regular rate and rhythm. No murmurs, gallops, or rubs. 2+ symmetrical distal pulses are present in all extremities. No JVD. Respiratory: Normal respiratory effort. Lungs are clear to auscultation bilaterally. No wheezes, crackles, or rhonchi.  Gastrointestinal: Soft, non tender, and non distended with positive bowel sounds. No rebound or guarding. Musculoskeletal: Nontender with normal range of motion in all extremities. No edema, cyanosis, or erythema of extremities. Neurologic: Normal speech and language. Face is symmetric. Moving all extremities. No gross focal neurologic deficits are appreciated. Skin: Skin is warm, dry and intact. No rash noted. Psychiatric: Mood and affect are decreased, patient is teary. Speech and behavior are normal.  ____________________________________________   LABS (all labs ordered are listed, but only abnormal results are displayed)  Labs Reviewed  CBC - Abnormal; Notable for the following components:      Result Value   Hemoglobin 9.9 (*)    HCT 32.6 (*)    MCV 68.8 (*)    MCH 20.8 (*)    MCHC 30.3 (*)    RDW 22.5 (*)    All other components within normal limits  BASIC METABOLIC PANEL - Abnormal; Notable for the following components:   Potassium 3.2 (*)     Glucose, Bld 101 (*)    All other components within normal limits  ETHANOL - Abnormal; Notable for the following components:   Alcohol, Ethyl (B) 118 (*)    All other components within normal limits  HEMOGLOBIN AND HEMATOCRIT, BLOOD - Abnormal; Notable for the following components:   Hemoglobin 9.8 (*)    HCT 31.8 (*)    All other components within normal limits  TROPONIN I  URINE DRUG SCREEN, QUALITATIVE ([facility] ONLY)  URINALYSIS, COMPLETE ([facility]) WITH MICROSCOPIC   ____________________________________________  EKG  ED ECG REPORT I, Rudene Re, the attending physician, personally viewed and interpreted this ECG.  Normal sinus rhythm, rate of 95, normal intervals, normal axis, slight ST depressions on inferior and lateral leads which are present on prior EKG. No ST elevation. ____________________________________________  RADIOLOGY  none  ____________________________________________  PROCEDURES  Procedure(s) performed: None Procedures Critical Care performed:  None ____________________________________________   INITIAL IMPRESSION / ASSESSMENT AND PLAN / ED COURSE   47 y.o. male with a history of alcohol abuse, hypertension, hyperlipidemia, and seizure disorder who presents after being found unresponsive in his car. Patient is A&O x 3, smells of alcohol. Patient has been having hard time coping with recent loss of his wife. No SI or HI. Neuro intact. No signs of trauma. Ddx alcohol intoxication vs seizure. Labs pending. Will restart patient antihypertensive and antiseizure medications.  Clinical Course as of Dec 21 1341  Thu Dec 21, 2017  1307 Patient's hemoglobin is low at 9.9. Patient endorses black stool for the last week with the last episode being this morning. Patient is not on any blood thinners. Rectal exam done here shows  light brown stool guaiac positive. I recommended admission to the hospital for further management however patient is concerned that his  insurance might not be active and is concerned about the cost of admission. He says he will see his primary care doctor tomorrow for GI evaluation as an outpatient. He does have a history of colon cancer in his mother. At this time there is no evidence of melena or upper GI bleeding. I will repeat her H&H since he has been 4 hours from initial to make sure it is not dropping. No BM in the ED, no hematemesis.  [CV]    Clinical Course User Index [CV] Alfred Levins, [place], MD   _________________________ 1:42 PM on 12/21/2017 -----------------------------------------  Hemoglobin remained stable. Patient continues to prefer to go home at this time. Discussed return precautions for melena, hematemesis, coffee-ground emesis, or signs or symptoms of acute blood loss anemia. Recommend close follow-up with primary care doctor. Discussed the patient needs a colonoscopy especially with his family history of colon cancer. Patient can be discharged with his mother who is at the bedside.  As part of my medical decision making, I reviewed the following data within the [facility] notes reviewed and incorporated, Labs reviewed , EKG interpreted , Notes from prior ED visits and [place] Controlled Substance Database    Pertinent labs & imaging results that were available during my care of the patient were reviewed by me and considered in my medical decision making (see chart for details).    ____________________________________________   FINAL CLINICAL IMPRESSION(S) / ED DIAGNOSES  Final diagnoses:  Alcoholic intoxication without complication (HCC)  Lower GI bleed      NEW MEDICATIONS STARTED DURING THIS VISIT:  ED Discharge Orders        Ordered    amLODipine (NORVASC) 5 MG tablet  Daily     12/21/17 1339    carbamazepine (TEGRETOL-XR) 200 MG 12 hr tablet  2 times daily     12/21/17 1339    lisinopril (PRINIVIL,ZESTRIL) 10 MG tablet  Daily     12/21/17 1339     hydrochlorothiazide (HYDRODIURIL) 12.5 MG tablet  Daily     12/21/17 1339       Note:  This document was prepared using Dragon voice recognition software and may include unintentional dictation errors.    Alfred Levins, [place], MD 12/21/17 [phone]

## 2017-12-25 ENCOUNTER — Ambulatory Visit (INDEPENDENT_AMBULATORY_CARE_PROVIDER_SITE_OTHER): Payer: 59 | Admitting: Unknown Physician Specialty

## 2017-12-25 ENCOUNTER — Encounter: Payer: Self-pay | Admitting: Unknown Physician Specialty

## 2017-12-25 ENCOUNTER — Other Ambulatory Visit: Payer: Self-pay | Admitting: Unknown Physician Specialty

## 2017-12-25 VITALS — BP 121/72 | HR 101 | Temp 99.5°F | Ht 69.0 in | Wt 214.0 lb

## 2017-12-25 DIAGNOSIS — R569 Unspecified convulsions: Secondary | ICD-10-CM

## 2017-12-25 DIAGNOSIS — K625 Hemorrhage of anus and rectum: Secondary | ICD-10-CM | POA: Diagnosis not present

## 2017-12-25 DIAGNOSIS — F1010 Alcohol abuse, uncomplicated: Secondary | ICD-10-CM

## 2017-12-25 DIAGNOSIS — I10 Essential (primary) hypertension: Secondary | ICD-10-CM | POA: Diagnosis not present

## 2017-12-25 NOTE — Assessment & Plan Note (Signed)
Planning to start [place] meeting.  Not quite a commitment yet.  Recheck in one month

## 2017-12-25 NOTE — Progress Notes (Signed)
BP 121/72 (BP Location: Left Arm, Cuff Size: Large)   Pulse (!) 101   Temp 99.5 F (37.5 C) (Oral)   Ht 5\' 9"  (1.753 m)   Wt 214 lb (97.1 kg)   SpO2 95%   BMI 31.60 kg/m    Subjective:    Patient ID: Jose Dike., male    DOB: [birth year], 47 y.o.   MRN: [id number]  HPI: Jose Nuttall. is a 47 y.o. male  Chief Complaint  Patient presents with  . Hypertension  . Seizures   Pt is here with his mother who gives some of the history:   Pt presented to the ER on 2/14 after being found in his care intoxicated.  At the time, Jose [name] was found to have a Hgb 9.9 with guiac positive stools.  His mother says she notices bright red bleeding on his clothes.  Jose [name] states this last episode before the ER was clots of black stool  Hypertension High BP in the ER.  Restarted his medication. Not SOB.  No chest pain  Alcohol Pt states Jose [name] drinks one beer and three shots/day.  Discussed this last episode found in the car intoxicated.    Seizure disorder Has been years since last seizure, even when coming off of medication  Relevant past medical, surgical, family and social history reviewed and updated as indicated. Interim medical history since our last visit reviewed. Allergies and medications reviewed and updated.  Review of Systems  Per HPI unless specifically indicated above     Objective:    BP 121/72 (BP Location: Left Arm, Cuff Size: Large)   Pulse (!) 101   Temp 99.5 F (37.5 C) (Oral)   Ht 5\' 9"  (1.753 m)   Wt 214 lb (97.1 kg)   SpO2 95%   BMI 31.60 kg/m   Wt Readings from Last 3 Encounters:  12/25/17 214 lb (97.1 kg)  01/09/17 233 lb 11.2 oz (106 kg)  07/21/16 222 lb 12.8 oz (101.1 kg)    Physical Exam  Constitutional: Jose [name] is oriented to person, place, and time. Jose [name] appears well-developed and well-nourished. No distress.  HENT:  Head: Normocephalic and atraumatic.  Eyes: Conjunctivae and lids are normal. Right eye exhibits no discharge. Left eye exhibits no discharge. No scleral  icterus.  Neck: Normal range of motion. Neck supple. No JVD present. Carotid bruit is not present.  Cardiovascular: Normal rate, regular rhythm and normal heart sounds.  Pulmonary/Chest: Effort normal and breath sounds normal. No respiratory distress.  Abdominal: Soft. Normal appearance. Jose [name] exhibits no distension and no mass. There is no splenomegaly or hepatomegaly. There is tenderness. There is no rebound and no guarding.  Musculoskeletal: Normal range of motion.  Neurological: Jose [name] is alert and oriented to person, place, and time.  Skin: Skin is warm, dry and intact. No rash noted. No pallor.  Psychiatric: Jose [name] has a normal mood and affect. His behavior is normal. Judgment and thought content normal.    Results for orders placed or performed during the hospital encounter of 12/21/17  CBC  Result Value Ref Range   WBC 4.3 3.8 - 10.6 K/uL   RBC 4.74 4.40 - 5.90 MIL/uL   Hemoglobin 9.9 (L) 13.0 - 18.0 g/dL   HCT 32.6 (L) 40.0 - 52.0 %   MCV 68.8 (L) 80.0 - 100.0 fL   MCH 20.8 (L) 26.0 - 34.0 pg   MCHC 30.3 (L) 32.0 - 36.0 g/dL   RDW 22.5 (H) 11.5 - 14.5 %  Platelets 303 150 - 440 K/uL  Basic metabolic panel  Result Value Ref Range   Sodium 141 135 - 145 mmol/L   Potassium 3.2 (L) 3.5 - 5.1 mmol/L   Chloride 104 101 - 111 mmol/L   CO2 23 22 - 32 mmol/L   Glucose, Bld 101 (H) 65 - 99 mg/dL   BUN 11 6 - 20 mg/dL   Creatinine, Ser 0.75 0.61 - 1.24 mg/dL   Calcium 9.4 8.9 - 10.3 mg/dL   GFR calc non Af Amer >60 >60 mL/min   GFR calc Af Amer >60 >60 mL/min   Anion gap 14 5 - 15  Troponin I  Result Value Ref Range   Troponin I <0.03 <0.03 ng/mL  Ethanol  Result Value Ref Range   Alcohol, Ethyl (B) 118 (H) <10 mg/dL  Hemoglobin and hematocrit, blood  Result Value Ref Range   Hemoglobin 9.8 (L) 13.0 - 18.0 g/dL   HCT 31.8 (L) 40.0 - 52.0 %      Assessment & Plan:   Problem List Items Addressed This Visit      Unprioritized   ETOH abuse - Primary    Planning to start AA  meeting.  Not quite a commitment yet.  Recheck in one month      Relevant Orders   Comprehensive metabolic panel   Hypertension    Stable, continue present medications.        Relevant Orders   Comprehensive metabolic panel   Rectal bleeding   Relevant Orders   CBC With Differential/Platelet   Ambulatory referral to Gastroenterology   Seizures [facility])    Currently controlled with no seizures for years          Follow up plan: Return in about 4 weeks (around 01/22/2018).

## 2017-12-25 NOTE — Assessment & Plan Note (Signed)
Stable, continue present medications.   

## 2017-12-25 NOTE — Assessment & Plan Note (Signed)
Currently controlled with no seizures for years

## 2017-12-26 ENCOUNTER — Encounter: Payer: Self-pay | Admitting: Unknown Physician Specialty

## 2017-12-26 LAB — COMPREHENSIVE METABOLIC PANEL
ALT: 26 IU/L (ref 0–44)
AST: 38 IU/L (ref 0–40)
Albumin/Globulin Ratio: 1.4 (ref 1.2–2.2)
Albumin: 4.4 g/dL (ref 3.5–5.5)
Alkaline Phosphatase: 96 IU/L (ref 39–117)
BUN/Creatinine Ratio: 20 (ref 9–20)
BUN: 17 mg/dL (ref 6–24)
Bilirubin Total: 0.3 mg/dL (ref 0.0–1.2)
CO2: 25 mmol/L (ref 20–29)
Calcium: 9.8 mg/dL (ref 8.7–10.2)
Chloride: 95 mmol/L — ABNORMAL LOW (ref 96–106)
Creatinine, Ser: 0.85 mg/dL (ref 0.76–1.27)
GFR calc Af Amer: 119 mL/min/{1.73_m2} (ref >59)
GFR calc non Af Amer: 103 mL/min/{1.73_m2} (ref >59)
Globulin, Total: 3.1 g/dL (ref 1.5–4.5)
Glucose: 93 mg/dL (ref 65–99)
Potassium: 3.5 mmol/L (ref 3.5–5.2)
Sodium: 139 mmol/L (ref 134–144)
Total Protein: 7.5 g/dL (ref 6.0–8.5)

## 2017-12-26 LAB — CBC WITH DIFFERENTIAL/PLATELET
Hematocrit: 34.3 % — ABNORMAL LOW (ref 37.5–51.0)
Hemoglobin: 10.4 g/dL — ABNORMAL LOW (ref 13.0–17.7)
Lymphocytes Absolute: 2 10*3/uL (ref 0.7–3.1)
Lymphs: 23 %
MCH: 21.9 pg — ABNORMAL LOW (ref 26.6–33.0)
MCHC: 30.3 g/dL — ABNORMAL LOW (ref 31.5–35.7)
MCV: 72 fL — ABNORMAL LOW (ref 79–97)
MID (Absolute): 1 10*3/uL (ref 0.1–1.6)
MID: 11 %
Neutrophils Absolute: 5.9 10*3/uL (ref 1.4–7.0)
Neutrophils: 67 %
Platelets: 288 10*3/uL (ref 150–379)
RBC: 4.75 x10E6/uL (ref 4.14–5.80)
RDW: 22.8 % — ABNORMAL HIGH (ref 12.3–15.4)
WBC: 8.9 10*3/uL (ref 3.4–10.8)

## 2017-12-27 ENCOUNTER — Telehealth: Payer: Self-pay | Admitting: Unknown Physician Specialty

## 2017-12-27 NOTE — Telephone Encounter (Signed)
Pt called in to request his lab results.   CB: [phone]

## 2017-12-27 NOTE — Telephone Encounter (Signed)
Letter was sent to patient yesterday. Called and let patient know that he was getting a letter with results and that they were normal per letter.

## 2018-01-02 ENCOUNTER — Ambulatory Visit: Payer: Self-pay

## 2018-01-02 NOTE — Telephone Encounter (Signed)
Pt. Reports swelling started yesterday. Denies difficulty swallowing or breathing. Upper lip "double in size". Pt. Instructed to go to ED. Reason for Disposition . Taking an ACE Inhibitor medication  (e.g., benazepril/LOTENSIN, captopril/CAPOTEN, enalapril/VASOTEC, lisinopril/ZESTRIL)  Answer Assessment - Initial Assessment Questions 1. ONSET: "When did the swelling start?" (e.g., minutes, hours, days)     Started yesterday 2. LOCATION: "What part of the face is swollen?"     Upper lip 3. SEVERITY: "How swollen is it?"     Double in size 4. ITCHING: "Is there any itching?" If so, ask: "How much?"   (Scale 1-10; mild, moderate or severe)     No 5. PAIN: "Is the swelling painful to touch?" If so, ask: "How painful is it?"   (Scale 1-10; mild, moderate or severe)     No 6. FEVER: "Do you have a fever?" If so, ask: "What is it, how was it measured, and when did it start?"      No 7. CAUSE: "What do you think is causing the face swelling?"     Unsure 8. RECURRENT SYMPTOM: "Have you had face swelling before?" If so, ask: "When was the last time?" "What happened that time?"     Yes 9. OTHER SYMPTOMS: "Do you have any other symptoms?" (e.g., toothache, leg swelling)     No 10. PREGNANCY: "Is there any chance you are pregnant?" "When was your last menstrual period?"       No  Protocols used: [facility]

## 2018-01-05 ENCOUNTER — Ambulatory Visit: Payer: Self-pay | Admitting: Gastroenterology

## 2018-01-12 ENCOUNTER — Other Ambulatory Visit
Admission: RE | Admit: 2018-01-12 | Discharge: 2018-01-12 | Disposition: A | Discharge status: Home / Self Care | Payer: 59 | Source: Ambulatory Visit | Attending: Gastroenterology | Admitting: Gastroenterology

## 2018-01-12 ENCOUNTER — Ambulatory Visit (INDEPENDENT_AMBULATORY_CARE_PROVIDER_SITE_OTHER): Payer: 59 | Admitting: Gastroenterology

## 2018-01-12 ENCOUNTER — Encounter: Payer: Self-pay | Admitting: Gastroenterology

## 2018-01-12 ENCOUNTER — Other Ambulatory Visit: Payer: Self-pay

## 2018-01-12 VITALS — BP 190/111 | HR 80 | Ht 69.0 in | Wt 219.4 lb

## 2018-01-12 DIAGNOSIS — R634 Abnormal weight loss: Secondary | ICD-10-CM

## 2018-01-12 DIAGNOSIS — D509 Iron deficiency anemia, unspecified: Secondary | ICD-10-CM | POA: Insufficient documentation

## 2018-01-12 DIAGNOSIS — K625 Hemorrhage of anus and rectum: Secondary | ICD-10-CM | POA: Insufficient documentation

## 2018-01-12 DIAGNOSIS — W108XXA Fall (on) (from) other stairs and steps, initial encounter: Secondary | ICD-10-CM | POA: Insufficient documentation

## 2018-01-12 LAB — CBC
HCT: 30.2 % — ABNORMAL LOW (ref 40.0–52.0)
Hemoglobin: 9.4 g/dL — ABNORMAL LOW (ref 13.0–18.0)
MCH: 21.7 pg — ABNORMAL LOW (ref 26.0–34.0)
MCHC: 31 g/dL — ABNORMAL LOW (ref 32.0–36.0)
MCV: 70.1 fL — ABNORMAL LOW (ref 80.0–100.0)
Platelets: 199 10*3/uL (ref 150–440)
RBC: 4.31 MIL/uL — ABNORMAL LOW (ref 4.40–5.90)
RDW: 23.2 % — ABNORMAL HIGH (ref 11.5–14.5)
WBC: 6 10*3/uL (ref 3.8–10.6)

## 2018-01-12 LAB — URINALYSIS, ROUTINE W REFLEX MICROSCOPIC
Bilirubin Urine: NEGATIVE
Glucose, UA: NEGATIVE mg/dL
Hgb urine dipstick: NEGATIVE
Ketones, ur: NEGATIVE mg/dL
Leukocytes, UA: NEGATIVE
Nitrite: NEGATIVE
Protein, ur: NEGATIVE mg/dL
Specific Gravity, Urine: 1.015 (ref 1.005–1.030)
pH: 6 (ref 5.0–8.0)

## 2018-01-12 LAB — IRON AND TIBC
Iron: 16 ug/dL — ABNORMAL LOW (ref 45–182)
Saturation Ratios: 4 % — ABNORMAL LOW (ref 17.9–39.5)
TIBC: 429 ug/dL (ref 250–450)
UIBC: 413 ug/dL

## 2018-01-12 LAB — FOLATE: Folate: 9.6 ng/mL (ref >5.9)

## 2018-01-12 LAB — FERRITIN: Ferritin: 8 ng/mL — ABNORMAL LOW (ref 24–336)

## 2018-01-12 LAB — VITAMIN B12: Vitamin B-12: 372 pg/mL (ref 180–914)
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
## 2018-01-16 ENCOUNTER — Ambulatory Visit: Payer: 59 | Admitting: Anesthesiology

## 2018-01-16 ENCOUNTER — Ambulatory Visit
Admission: RE | Admit: 2018-01-16 | Discharge: 2018-01-16 | Disposition: A | Discharge status: Home / Self Care | Payer: 59 | Source: Ambulatory Visit | Attending: Gastroenterology | Admitting: Gastroenterology

## 2018-01-16 ENCOUNTER — Encounter
Admission: RE | Disposition: A | Discharge status: Home / Self Care | Payer: Self-pay | Source: Ambulatory Visit | Attending: Gastroenterology

## 2018-01-16 ENCOUNTER — Encounter: Payer: Self-pay | Admitting: *Deleted

## 2018-01-16 DIAGNOSIS — K625 Hemorrhage of anus and rectum: Secondary | ICD-10-CM | POA: Diagnosis present

## 2018-01-16 DIAGNOSIS — Z800 Family history of malignant neoplasm of digestive organs: Secondary | ICD-10-CM | POA: Diagnosis not present

## 2018-01-16 DIAGNOSIS — Z79899 Other long term (current) drug therapy: Secondary | ICD-10-CM | POA: Insufficient documentation

## 2018-01-16 DIAGNOSIS — D509 Iron deficiency anemia, unspecified: Secondary | ICD-10-CM | POA: Diagnosis present

## 2018-01-16 DIAGNOSIS — R569 Unspecified convulsions: Secondary | ICD-10-CM | POA: Diagnosis not present

## 2018-01-16 DIAGNOSIS — K3189 Other diseases of stomach and duodenum: Secondary | ICD-10-CM | POA: Insufficient documentation

## 2018-01-16 DIAGNOSIS — K2950 Unspecified chronic gastritis without bleeding: Secondary | ICD-10-CM | POA: Diagnosis not present

## 2018-01-16 DIAGNOSIS — F17210 Nicotine dependence, cigarettes, uncomplicated: Secondary | ICD-10-CM | POA: Insufficient documentation

## 2018-01-16 DIAGNOSIS — K641 Second degree hemorrhoids: Secondary | ICD-10-CM | POA: Insufficient documentation

## 2018-01-16 DIAGNOSIS — K644 Residual hemorrhoidal skin tags: Secondary | ICD-10-CM | POA: Diagnosis not present

## 2018-01-16 DIAGNOSIS — K228 Other specified diseases of esophagus: Secondary | ICD-10-CM | POA: Diagnosis not present

## 2018-01-16 DIAGNOSIS — D124 Benign neoplasm of descending colon: Secondary | ICD-10-CM | POA: Insufficient documentation

## 2018-01-16 DIAGNOSIS — K621 Rectal polyp: Secondary | ICD-10-CM | POA: Insufficient documentation

## 2018-01-16 DIAGNOSIS — D500 Iron deficiency anemia secondary to blood loss (chronic): Secondary | ICD-10-CM

## 2018-01-16 DIAGNOSIS — E785 Hyperlipidemia, unspecified: Secondary | ICD-10-CM | POA: Insufficient documentation

## 2018-01-16 DIAGNOSIS — K5730 Diverticulosis of large intestine without perforation or abscess without bleeding: Secondary | ICD-10-CM | POA: Insufficient documentation

## 2018-01-16 DIAGNOSIS — I10 Essential (primary) hypertension: Secondary | ICD-10-CM | POA: Diagnosis not present

## 2018-01-16 DIAGNOSIS — R634 Abnormal weight loss: Secondary | ICD-10-CM

## 2018-01-16 DIAGNOSIS — D123 Benign neoplasm of transverse colon: Secondary | ICD-10-CM | POA: Diagnosis not present

## 2018-01-16 HISTORY — PX: ESOPHAGOGASTRODUODENOSCOPY (EGD) WITH PROPOFOL: SHX5813

## 2018-01-16 HISTORY — PX: COLONOSCOPY WITH PROPOFOL: SHX5780

## 2018-01-16 SURGERY — COLONOSCOPY WITH PROPOFOL
Anesthesia: General

## 2018-01-16 MED ORDER — PROPOFOL 500 MG/50ML IV EMUL
INTRAVENOUS | Status: DC | PRN
  Administered 2018-01-16: 160 ug/kg/min via INTRAVENOUS

## 2018-01-16 MED ORDER — MIDAZOLAM HCL 2 MG/2ML IJ SOLN
INTRAMUSCULAR | Status: AC
  Filled 2018-01-16: qty 2

## 2018-01-16 MED ORDER — LIDOCAINE HCL (CARDIAC) 20 MG/ML IV SOLN
INTRAVENOUS | Status: DC | PRN
  Administered 2018-01-16: 60 mg via INTRAVENOUS

## 2018-01-16 MED ORDER — PROPOFOL 10 MG/ML IV BOLUS
INTRAVENOUS | Status: DC | PRN
  Administered 2018-01-16: 20 mg via INTRAVENOUS
  Administered 2018-01-16: 70 mg via INTRAVENOUS
  Administered 2018-01-16: 30 mg via INTRAVENOUS

## 2018-01-16 MED ORDER — SODIUM CHLORIDE 0.9 % IV SOLN
INTRAVENOUS | Status: DC
  Administered 2018-01-16 (×2): via INTRAVENOUS

## 2018-01-16 MED ORDER — PROPOFOL 500 MG/50ML IV EMUL
INTRAVENOUS | Status: AC
  Filled 2018-01-16: qty 50

## 2018-01-16 MED ORDER — FERROUS SULFATE 325 (65 FE) MG PO TBEC: 325.0000 mg | DELAYED_RELEASE_TABLET | Freq: Three times a day (TID) | ORAL | 1 refills | Status: DC

## 2018-01-16 MED ORDER — MIDAZOLAM HCL 2 MG/2ML IJ SOLN
INTRAMUSCULAR | Status: DC | PRN
  Administered 2018-01-16 (×2): 1 mg via INTRAVENOUS

## 2018-01-16 MED ORDER — GLYCOPYRROLATE 0.2 MG/ML IJ SOLN
INTRAMUSCULAR | Status: AC
  Filled 2018-01-16: qty 1

## 2018-01-16 MED ORDER — LIDOCAINE HCL (PF) 2 % IJ SOLN
INTRAMUSCULAR | Status: AC
  Filled 2018-01-16: qty 10

## 2018-01-16 NOTE — Anesthesia Post-op Follow-up Note (Signed)
Anesthesia QCDR form completed.        

## 2018-01-16 NOTE — Anesthesia Postprocedure Evaluation (Signed)
Anesthesia Post Note  Patient: Lisa Blakeman.  Procedure(s) Performed: COLONOSCOPY WITH PROPOFOL (N/A ) ESOPHAGOGASTRODUODENOSCOPY (EGD) WITH PROPOFOL (N/A )  Patient location during evaluation: Endoscopy Anesthesia Type: General Level of consciousness: awake and alert Pain management: pain level controlled Vital Signs Assessment: post-procedure vital signs reviewed and stable Respiratory status: spontaneous breathing, nonlabored ventilation, respiratory function stable and patient connected to nasal cannula oxygen Cardiovascular status: blood pressure returned to baseline and stable Postop Assessment: no apparent nausea or vomiting Anesthetic complications: no     Last Vitals:  Vitals:   01/16/18 1401 01/16/18 1552  BP: (!) 162/105   Pulse: 91   Resp: 18   Temp: (!) 35.8 C 36.8 C  SpO2: 98%     Last Pain:  Vitals:   01/16/18 1552  TempSrc: Tympanic                 THOMAS,MATHAI S

## 2018-01-16 NOTE — Anesthesia Preprocedure Evaluation (Signed)
Anesthesia Evaluation  Patient identified by MRN, date of birth, ID band Patient awake    Reviewed: Allergy & Precautions, H&P , NPO status , Patient's Chart, lab work & pertinent test results  History of Anesthesia Complications Negative for: history of anesthetic complications  Airway Mallampati: III  TM Distance: <3 FB Neck ROM: full    Dental  (+) Chipped   Pulmonary asthma , COPD, Current Smoker,           Cardiovascular Exercise Tolerance: Good hypertension, (-) angina(-) Past MI and (-) DOE      Neuro/Psych Seizures -, Well Controlled,  PSYCHIATRIC DISORDERS    GI/Hepatic negative GI ROS, Neg liver ROS, neg GERD  ,  Endo/Other  negative endocrine ROS  Renal/GU negative Renal ROS  negative genitourinary   Musculoskeletal   Abdominal   Peds  Hematology negative hematology ROS (+)   Anesthesia Other Findings Past Medical History: No date: Alcohol abuse No date: Cervicalgia No date: Hyperlipidemia No date: Hypertension No date: Seizures ([place])  History reviewed. No pertinent surgical history.  BMI    Body Mass Index:  31.60 kg/m      Reproductive/Obstetrics negative OB ROS                             Anesthesia Physical Anesthesia Plan  ASA: III  Anesthesia Plan: General   Post-op Pain Management:    Induction: Intravenous  PONV Risk Score and Plan: Propofol infusion and TIVA  Airway Management Planned: Natural Airway and Nasal Cannula  Additional Equipment:   Intra-op Plan:   Post-operative Plan:   Informed Consent: I have reviewed the patients History and Physical, chart, labs and discussed the procedure including the risks, benefits and alternatives for the proposed anesthesia with the patient or authorized representative who has indicated his/her understanding and acceptance.   Dental Advisory Given  Plan Discussed with: Anesthesiologist, CRNA and  Surgeon  Anesthesia Plan Comments: (Patient consented for risks of anesthesia including but not limited to:  - adverse reactions to medications - risk of intubation if required - damage to teeth, lips or other oral mucosa - sore throat or hoarseness - Damage to heart, brain, lungs or loss of life  Patient voiced understanding.)        Anesthesia Quick Evaluation

## 2018-01-16 NOTE — Transfer of Care (Signed)
Immediate Anesthesia Transfer of Care Note  Patient: Jose [name].  Procedure(s) Performed: COLONOSCOPY WITH PROPOFOL (N/A ) ESOPHAGOGASTRODUODENOSCOPY (EGD) WITH PROPOFOL (N/A )  Patient Location: PACU  Anesthesia Type:General  Level of Consciousness: sedated  Airway & Oxygen Therapy: Patient Spontanous Breathing and Patient connected to nasal cannula oxygen  Post-op Assessment: Report given to RN and Post -op Vital signs reviewed and stable  Post vital signs: Reviewed and stable  Last Vitals:  Vitals:   01/16/18 1401 01/16/18 1552  BP: (!) 162/105   Pulse: 91   Resp: 18   Temp: (!) 35.8 C 36.8 C  SpO2: 98%     Last Pain:  Vitals:   01/16/18 1552  TempSrc: Tympanic         Complications: No apparent anesthesia complications

## 2018-01-16 NOTE — Op Note (Signed)
[facility] Gastroenterology Patient Name: Jose [name] Procedure Date: 01/16/2018 2:57 PM MRN: [id number] Account #: [id number] Date of Birth: [birth year] Admit Type: Outpatient Age: 47 Room: [facility] ENDO ROOM 4 Gender: Male Note Status: Finalized Procedure:            Upper GI endoscopy Indications:          Unexplained iron deficiency anemia Providers:            Lin Landsman MD, MD Referring MD:         Kathrine Haddock (Referring MD) Medicines:            Monitored Anesthesia Care Complications:        No immediate complications. Estimated blood loss:                        Minimal. Procedure:            Pre-Anesthesia Assessment:                       - Prior to the procedure, a History and Physical was                        performed, and patient medications and allergies were                        reviewed. The patient is competent. The risks and                        benefits of the procedure and the sedation options and                        risks were discussed with the patient. All questions                        were answered and informed consent was obtained.                        Patient identification and proposed procedure were                        verified by the physician, the nurse, the                        anesthesiologist, the anesthetist and the technician in                        the pre-procedure area in the procedure room in the                        endoscopy suite. Mental Status Examination: alert and                        oriented. Airway Examination: normal oropharyngeal                        airway and neck mobility. Respiratory Examination:                        clear to auscultation. CV Examination: normal.  Prophylactic Antibiotics: The patient does not require                        prophylactic antibiotics. Prior Anticoagulants: The                        patient has taken no previous anticoagulant  or                        antiplatelet agents. ASA Grade Assessment: III - A                        patient with severe systemic disease. After reviewing                        the risks and benefits, the patient was deemed in                        satisfactory condition to undergo the procedure. The                        anesthesia plan was to use monitored anesthesia care                        (MAC). Immediately prior to administration of                        medications, the patient was re-assessed for adequacy                        to receive sedatives. The heart rate, respiratory rate,                        oxygen saturations, blood pressure, adequacy of                        pulmonary ventilation, and response to care were                        monitored throughout the procedure. The physical status                        of the patient was re-assessed after the procedure.                       After obtaining informed consent, the endoscope was                        passed under direct vision. Throughout the procedure,                        the patient's blood pressure, pulse, and oxygen                        saturations were monitored continuously. The Endoscope                        was introduced through the mouth, and advanced to the  second part of duodenum. The upper GI endoscopy was                        accomplished without difficulty. The patient tolerated                        the procedure well. Findings:      The second portion of the duodenum was normal.      Localized moderate mucosal changes characterized by scarring (post       ulcer) were found in the duodenal bulb.      A few dispersed, diminutive non-bleeding erosions were found in the       gastric antrum. There were no stigmata of recent bleeding.      Diffuse moderate mucosal changes characterized by nodularity were found       in the gastric fundus and in the gastric body.  Biopsies were taken with       a cold forceps for Helicobacter pylori testing.      The Z-line was irregular.      The examined esophagus was normal. Impression:           - Normal second portion of the duodenum.                       - Mucosal changes in the duodenum.                       - Non-bleeding erosive gastropathy.                       - Nodular mucosa in the gastric fundus and gastric                        body. Biopsied.                       - Z-line irregular.                       - Normal esophagus. Recommendation:       - Await pathology results.                       - Use a proton pump inhibitor PO BID.                       - Proceed with colonoscopy as scheduled                       - See colonoscopy report                       - Abstinence from ETOH Procedure Code(s):    --- Professional ---                       [phone], Esophagogastroduodenoscopy, flexible, transoral;                        with biopsy, single or multiple Diagnosis Code(s):    --- Professional ---                       K31.89, Other diseases of stomach and duodenum  K22.8, Other specified diseases of esophagus                       D50.9, Iron deficiency anemia, unspecified CPT copyright 2016 American Medical Association. All rights reserved. The codes documented in this report are preliminary and upon coder review may  be revised to meet current compliance requirements. Dr. Ulyess Mort Lin Landsman MD, MD 01/16/2018 3:15:35 PM This report has been signed electronically. Number of Addenda: 0 Note Initiated On: 01/16/2018 2:57 PM      [facility]

## 2018-01-16 NOTE — Op Note (Signed)
[facility] Gastroenterology Patient Name: Jose [name] Procedure Date: 01/16/2018 2:57 PM MRN: [id number] Account #: [id number] Date of Birth: [birth year] Admit Type: Outpatient Age: 47 Room: [facility] ENDO ROOM 4 Gender: Male Note Status: Finalized Procedure:            Colonoscopy Indications:          Rectal bleeding, Unexplained iron deficiency anemia Providers:            Lin Landsman MD, MD Referring MD:         Kathrine Haddock (Referring MD) Medicines:            Monitored Anesthesia Care Complications:        No immediate complications. Estimated blood loss: None. Procedure:            Pre-Anesthesia Assessment:                       - Prior to the procedure, a History and Physical was                        performed, and patient medications and allergies were                        reviewed. The patient is competent. The risks and                        benefits of the procedure and the sedation options and                        risks were discussed with the patient. All questions                        were answered and informed consent was obtained.                        Patient identification and proposed procedure were                        verified by the physician, the nurse, the                        anesthesiologist, the anesthetist and the technician in                        the pre-procedure area in the procedure room in the                        endoscopy suite. Mental Status Examination: alert and                        oriented. Airway Examination: normal oropharyngeal                        airway and neck mobility. Respiratory Examination:                        clear to auscultation. CV Examination: normal.                        Prophylactic Antibiotics: The patient does not require  prophylactic antibiotics. Prior Anticoagulants: The                        patient has taken no previous anticoagulant or             antiplatelet agents. ASA Grade Assessment: III - A                        patient with severe systemic disease. After reviewing                        the risks and benefits, the patient was deemed in                        satisfactory condition to undergo the procedure. The                        anesthesia plan was to use monitored anesthesia care                        (MAC). Immediately prior to administration of                        medications, the patient was re-assessed for adequacy                        to receive sedatives. The heart rate, respiratory rate,                        oxygen saturations, blood pressure, adequacy of                        pulmonary ventilation, and response to care were                        monitored throughout the procedure. The physical status                        of the patient was re-assessed after the procedure.                       After obtaining informed consent, the colonoscope was                        passed under direct vision. Throughout the procedure,                        the patient's blood pressure, pulse, and oxygen                        saturations were monitored continuously. The                        Colonoscope was introduced through the anus and                        advanced to the the cecum, identified by appendiceal                        orifice and ileocecal valve. The colonoscopy was  performed without difficulty. The patient tolerated the                        procedure well. The quality of the bowel preparation                        was evaluated using the BBPS [facility] Bowel Preparation                        Scale) with scores of: Right Colon = 3, Transverse                        Colon = 3 and Left Colon = 3 (entire mucosa seen well                        with no residual staining, small fragments of stool or                        opaque liquid). The total BBPS score equals  9. Findings:      Three sessile polyps were found in the rectum, descending colon and       transverse colon. The polyps were 6 to 9 mm in size. These polyps were       removed with a hot snare. Resection and retrieval were complete.      External and internal hemorrhoids were found during retroflexion, during       perianal exam and during digital exam. The hemorrhoids were large.      Multiple diverticula were found in the sigmoid colon. There was no       evidence of diverticular bleeding.      The perianal exam findings include non-thrombosed external hemorrhoids       and internal hemorrhoids that prolapse with straining, but spontaneously       regress to the resting position (Grade II). Impression:           - Three 6 to 9 mm polyps in the rectum, in the                        descending colon and in the transverse colon, removed                        with a hot snare. Resected and retrieved.                       - External and internal hemorrhoids.                       - Moderate diverticulosis in the sigmoid colon. There                        was no evidence of diverticular bleeding.                       - Non-thrombosed external hemorrhoids and internal                        hemorrhoids that prolapse with straining, but  spontaneously regress to the resting position (Grade                        II) found on perianal exam. Recommendation:       - Discharge patient to home.                       - Resume previous diet today.                       - Continue present medications.                       - Await pathology results.                       - Repeat colonoscopy in 3 years for surveillance of                        multiple polyps.                       - Return to my office as previously scheduled for                        rubberband ligation of hemorrhoids.                       - Start oral iron 365m TID, take miralax as needed for                         iron induced constipation Procedure Code(s):    --- Professional ---                       [id number] Colonoscopy, flexible; with removal of tumor(s),                        polyp(s), or other lesion(s) by snare technique Diagnosis Code(s):    --- Professional ---                       K62.1, Rectal polyp                       D12.4, Benign neoplasm of descending colon                       D12.3, Benign neoplasm of transverse colon (hepatic                        flexure or splenic flexure)                       K64.1, Second degree hemorrhoids                       K64.4, Residual hemorrhoidal skin tags                       K62.5, Hemorrhage of anus and rectum                       D50.9, Iron deficiency anemia, unspecified  K57.30, Diverticulosis of large intestine without                        perforation or abscess without bleeding CPT copyright 2016 American Medical Association. All rights reserved. The codes documented in this report are preliminary and upon coder review may  be revised to meet current compliance requirements. Dr. Ulyess Mort Lin Landsman MD, MD 01/16/2018 3:49:51 PM This report has been signed electronically. Number of Addenda: 0 Note Initiated On: 01/16/2018 2:57 PM Scope Withdrawal Time: 0 hours 19 minutes 6 seconds  Total Procedure Duration: 0 hours 28 minutes 17 seconds       [facility]

## 2018-01-16 NOTE — H&P (Signed)
  Jose Darby, MD [address]  [place]  [place], [place] [address]  Main: [phone]  Fax: [phone] Pager: [phone]  Primary Care Physician:  Kathrine Haddock, NP Primary Gastroenterologist:  Dr. Cephas [name]  Pre-Procedure History & Physical: HPI:  Jose Pollok. is a 47 y.o. male is here for an endoscopy and colonoscopy.   Past Medical History:  Diagnosis Date  . Alcohol abuse   . Cervicalgia   . Hyperlipidemia   . Hypertension   . Seizures ([place])     No past surgical history on file.  Prior to Admission medications   Medication Sig Start Date End Date Taking? Authorizing Provider  amLODipine (NORVASC) 5 MG tablet Take 1 tablet (5 mg total) by mouth daily. 12/21/17 12/21/18  Rudene Re, MD  carbamazepine (TEGRETOL-XR) 200 MG 12 hr tablet Take 1 tablet (200 mg total) by mouth 2 (two) times daily. 12/21/17 12/21/18  Rudene Re, MD  hydrochlorothiazide (HYDRODIURIL) 12.5 MG tablet Take 1 tablet (12.5 mg total) by mouth daily. 12/21/17   Rudene Re, MD  lisinopril (PRINIVIL,ZESTRIL) 10 MG tablet Take 2 tablets (20 mg total) by mouth daily. 12/21/17 12/21/18  Rudene Re, MD  simvastatin (ZOCOR) 20 MG tablet Take 1 tablet (20 mg total) by mouth daily. 01/09/17   Kathrine Haddock, NP    Allergies as of 01/15/2018  . (No Known Allergies)    Family History  Problem Relation Age of Onset  . Diabetes Mother   . Colon cancer Mother   . Hypertension Father   . Multiple sclerosis Father     Social History   Socioeconomic History  . Marital status: Widowed    Spouse name: Not on file  . Number of children: Not on file  . Years of education: Not on file  . Highest education level: Not on file  Social Needs  . Financial resource strain: Not on file  . Food insecurity - worry: Not on file  . Food insecurity - inability: Not on file  . Transportation needs - medical: Not on file  . Transportation needs - non-medical: Not on file    Occupational History  . Not on file  Tobacco Use  . Smoking status: Current Every Day Smoker    Packs/day: 0.50  . Smokeless tobacco: Former Network engineer and Sexual Activity  . Alcohol use: Yes    Alcohol/week: 16.8 oz    Types: 7 Cans of beer, 21 Shots of liquor per week  . Drug use: No  . Sexual activity: Yes  Other Topics Concern  . Not on file  Social History Narrative  . Not on file    Review of Systems: See HPI, otherwise negative ROS  Physical Exam: There were no vitals taken for this visit. General:   Alert,  pleasant and cooperative in NAD Head:  Normocephalic and atraumatic. Neck:  Supple; no masses or thyromegaly. Lungs:  Clear throughout to auscultation.    Heart:  Regular rate and rhythm. Abdomen:  Soft, nontender and nondistended. Normal bowel sounds, without guarding, and without rebound.   Neurologic:  Alert and  oriented x4;  grossly normal neurologically.  Impression/Plan: Jose Dakins. is here for an endoscopy and colonoscopy to be performed for iron def anemia  Risks, benefits, limitations, and alternatives regarding  endoscopy and colonoscopy have been reviewed with the patient.  Questions have been answered.  All parties agreeable.   Jose Sear, MD  01/16/2018, 1:35 PM

## 2018-01-17 ENCOUNTER — Encounter: Payer: Self-pay | Admitting: Gastroenterology

## 2018-01-18 LAB — SURGICAL PATHOLOGY

## 2018-01-19 ENCOUNTER — Encounter: Payer: Self-pay | Admitting: Gastroenterology

## 2018-01-22 ENCOUNTER — Ambulatory Visit: Payer: 59 | Admitting: Unknown Physician Specialty

## 2018-02-09 ENCOUNTER — Encounter: Payer: Self-pay | Admitting: Gastroenterology

## 2018-02-09 ENCOUNTER — Ambulatory Visit: Payer: 59 | Admitting: Gastroenterology

## 2018-02-12 ENCOUNTER — Telehealth: Payer: Self-pay | Admitting: Gastroenterology

## 2018-02-12 NOTE — Telephone Encounter (Signed)
Pt left vm to reschedule apt he missed  Friday

## 2018-02-12 NOTE — Telephone Encounter (Signed)
Tai, please schedule pt for his follow up appt.  He missed on I think Thursday or Friday.  Thanks [employer]

## 2018-02-12 NOTE — Telephone Encounter (Signed)
Left vm for pt to call office and reschedule his apt

## 2018-06-12 ENCOUNTER — Other Ambulatory Visit
Admission: RE | Admit: 2018-06-12 | Discharge: 2018-06-12 | Disposition: A | Discharge status: Home / Self Care | Payer: 59 | Source: Ambulatory Visit | Attending: Gastroenterology | Admitting: Gastroenterology

## 2018-06-12 ENCOUNTER — Encounter: Payer: Self-pay | Admitting: Gastroenterology

## 2018-06-12 ENCOUNTER — Ambulatory Visit (INDEPENDENT_AMBULATORY_CARE_PROVIDER_SITE_OTHER): Payer: 59 | Admitting: Gastroenterology

## 2018-06-12 VITALS — BP 175/102 | HR 75 | Resp 17 | Ht 69.0 in | Wt 211.4 lb

## 2018-06-12 DIAGNOSIS — K625 Hemorrhage of anus and rectum: Secondary | ICD-10-CM | POA: Diagnosis not present

## 2018-06-12 DIAGNOSIS — D500 Iron deficiency anemia secondary to blood loss (chronic): Secondary | ICD-10-CM

## 2018-06-12 DIAGNOSIS — K641 Second degree hemorrhoids: Secondary | ICD-10-CM | POA: Insufficient documentation

## 2018-06-12 LAB — CBC
HCT: 34.3 % — ABNORMAL LOW (ref 40.0–52.0)
Hemoglobin: 11 g/dL — ABNORMAL LOW (ref 13.0–18.0)
MCH: 24.5 pg — ABNORMAL LOW (ref 26.0–34.0)
MCHC: 32.2 g/dL (ref 32.0–36.0)
MCV: 76.3 fL — ABNORMAL LOW (ref 80.0–100.0)
Platelets: 199 10*3/uL (ref 150–440)
RBC: 4.5 MIL/uL (ref 4.40–5.90)
RDW: 23.8 % — ABNORMAL HIGH (ref 11.5–14.5)
WBC: 4.5 10*3/uL (ref 3.8–10.6)

## 2018-06-12 LAB — IRON AND TIBC
Iron: 96 ug/dL (ref 45–182)
Saturation Ratios: 19 % (ref 17.9–39.5)
TIBC: 512 ug/dL — ABNORMAL HIGH (ref 250–450)
UIBC: 416 ug/dL

## 2018-06-12 LAB — VITAMIN B12: Vitamin B-12: 268 pg/mL (ref 180–914)

## 2018-06-12 LAB — FERRITIN: Ferritin: 15 ng/mL — ABNORMAL LOW (ref 24–336)
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
## 2018-06-13 ENCOUNTER — Ambulatory Visit (INDEPENDENT_AMBULATORY_CARE_PROVIDER_SITE_OTHER): Payer: 59 | Admitting: Gastroenterology

## 2018-06-13 ENCOUNTER — Encounter: Payer: Self-pay | Admitting: Gastroenterology

## 2018-06-13 VITALS — BP 155/102 | HR 71 | Resp 17 | Ht 69.0 in | Wt 208.2 lb

## 2018-06-13 DIAGNOSIS — K641 Second degree hemorrhoids: Secondary | ICD-10-CM | POA: Diagnosis not present

## 2018-06-13 NOTE — Progress Notes (Signed)
155 102  

## 2018-06-13 NOTE — Progress Notes (Signed)
PROCEDURE NOTE: The patient presents with symptomatic grade 2 hemorrhoids, unresponsive to maximal medical therapy, requesting rubber band ligation of his/her hemorrhoidal disease.  All risks, benefits and alternative forms of therapy were described and informed consent was obtained.  The decision was made to band the RA internal hemorrhoid, and the [facility] O'Regan System was used to perform band ligation without complication.  Digital anorectal examination was then performed to assure proper positioning of the band, and to adjust the banded tissue as required.  The patient was discharged home without pain or other issues.  Dietary and behavioral recommendations were given and (if necessary - prescriptions were given), along with follow-up instructions.  The patient will return 2 weeks for follow-up and possible additional banding as required.  No complications were encountered and the patient tolerated the procedure well.  Rohini R Vanga, MD [address]  [address]  [place], [place] [address]  Main: [phone]  Fax: [phone] Pager: [phone]   

## 2018-06-27 ENCOUNTER — Encounter: Payer: Self-pay | Admitting: Gastroenterology

## 2018-06-27 ENCOUNTER — Ambulatory Visit: Payer: 59 | Admitting: Gastroenterology

## 2018-06-27 VITALS — BP 108/72 | HR 88 | Resp 18 | Ht 69.0 in | Wt 204.0 lb

## 2018-06-27 DIAGNOSIS — K641 Second degree hemorrhoids: Secondary | ICD-10-CM | POA: Diagnosis not present

## 2018-06-27 NOTE — Progress Notes (Signed)
PROCEDURE NOTE: The patient presents with symptomatic grade 2 hemorrhoids, unresponsive to maximal medical therapy, requesting rubber band ligation of his/her hemorrhoidal disease.  All risks, benefits and alternative forms of therapy were described and informed consent was obtained.  The decision was made to band the RP internal hemorrhoid, and the [place] was used to perform band ligation without complication.  Digital anorectal examination was then performed to assure proper positioning of the band, and to adjust the banded tissue as required.  The patient was discharged home without pain or other issues.  Dietary and behavioral recommendations were given and (if necessary - prescriptions were given), along with follow-up instructions.  The patient will return 2 weeks for follow-up and possible additional banding as required.  No complications were encountered and the patient tolerated the procedure well.  Cephas Darby, MD [address]  [place]  [place],  [address]  Main: [phone]  Fax: [phone] Pager: [phone]

## 2018-07-13 ENCOUNTER — Encounter: Payer: Self-pay | Admitting: Gastroenterology

## 2018-07-13 ENCOUNTER — Ambulatory Visit (INDEPENDENT_AMBULATORY_CARE_PROVIDER_SITE_OTHER): Payer: 59 | Admitting: Gastroenterology

## 2018-07-13 VITALS — BP 112/73 | HR 85 | Ht 69.0 in | Wt 204.0 lb

## 2018-07-13 DIAGNOSIS — K641 Second degree hemorrhoids: Secondary | ICD-10-CM | POA: Diagnosis not present

## 2018-07-13 NOTE — Progress Notes (Signed)
PROCEDURE NOTE: °The patient presents with symptomatic grade 2 hemorrhoids, unresponsive to maximal medical therapy, requesting rubber band ligation of his/her hemorrhoidal disease.  All risks, benefits and alternative forms of therapy were described and informed consent was obtained. ° °The decision was made to band the LL internal hemorrhoid, and the [facility] O’Regan System was used to perform band ligation without complication.  Digital anorectal examination was then performed to assure proper positioning of the band, and to adjust the banded tissue as required.  The patient was discharged home without pain or other issues.  Dietary and behavioral recommendations were given and (if necessary - prescriptions were given), along with follow-up instructions.  The patient will return as needed for follow-up and possible additional banding as required. ° °No complications were encountered and the patient tolerated the procedure well. ° ° °Rohini R Vanga, MD °[address]  °[address]  °[place], [place] [address]  °Main: [phone]  °Fax: [phone] °Pager: [phone] ° ° °

## 2019-01-28 ENCOUNTER — Other Ambulatory Visit: Payer: Self-pay

## 2019-01-28 ENCOUNTER — Emergency Department: Payer: Self-pay

## 2019-01-28 ENCOUNTER — Encounter: Payer: Self-pay | Admitting: Emergency Medicine

## 2019-01-28 ENCOUNTER — Inpatient Hospital Stay
Admission: EM | Admit: 2019-01-28 | Discharge: 2019-01-30 | DRG: 292 | Disposition: A | Discharge status: Home / Self Care | Payer: Self-pay | Attending: Internal Medicine | Admitting: Internal Medicine

## 2019-01-28 DIAGNOSIS — F17210 Nicotine dependence, cigarettes, uncomplicated: Secondary | ICD-10-CM | POA: Diagnosis present

## 2019-01-28 DIAGNOSIS — Z833 Family history of diabetes mellitus: Secondary | ICD-10-CM

## 2019-01-28 DIAGNOSIS — I110 Hypertensive heart disease with heart failure: Principal | ICD-10-CM | POA: Diagnosis present

## 2019-01-28 DIAGNOSIS — R0902 Hypoxemia: Secondary | ICD-10-CM | POA: Diagnosis present

## 2019-01-28 DIAGNOSIS — R0789 Other chest pain: Secondary | ICD-10-CM | POA: Diagnosis present

## 2019-01-28 DIAGNOSIS — E782 Mixed hyperlipidemia: Secondary | ICD-10-CM | POA: Diagnosis present

## 2019-01-28 DIAGNOSIS — Z8249 Family history of ischemic heart disease and other diseases of the circulatory system: Secondary | ICD-10-CM

## 2019-01-28 DIAGNOSIS — Z800 Family history of malignant neoplasm of digestive organs: Secondary | ICD-10-CM

## 2019-01-28 DIAGNOSIS — I5033 Acute on chronic diastolic (congestive) heart failure: Secondary | ICD-10-CM | POA: Diagnosis present

## 2019-01-28 DIAGNOSIS — I160 Hypertensive urgency: Secondary | ICD-10-CM | POA: Diagnosis present

## 2019-01-28 DIAGNOSIS — Z79899 Other long term (current) drug therapy: Secondary | ICD-10-CM

## 2019-01-28 DIAGNOSIS — N179 Acute kidney failure, unspecified: Secondary | ICD-10-CM | POA: Diagnosis present

## 2019-01-28 DIAGNOSIS — R0602 Shortness of breath: Secondary | ICD-10-CM

## 2019-01-28 DIAGNOSIS — I509 Heart failure, unspecified: Secondary | ICD-10-CM

## 2019-01-28 DIAGNOSIS — Z820 Family history of epilepsy and other diseases of the nervous system: Secondary | ICD-10-CM

## 2019-01-28 DIAGNOSIS — Z9114 Patient's other noncompliance with medication regimen: Secondary | ICD-10-CM

## 2019-01-28 DIAGNOSIS — F1010 Alcohol abuse, uncomplicated: Secondary | ICD-10-CM | POA: Diagnosis present

## 2019-01-28 LAB — TROPONIN I
Troponin I: <0.03 ng/mL (ref <0.03)
Troponin I: <0.03 ng/mL (ref <0.03)
Troponin I: <0.03 ng/mL (ref <0.03)

## 2019-01-28 LAB — BASIC METABOLIC PANEL
Anion gap: 9 (ref 5–15)
BUN: 8 mg/dL (ref 6–20)
CO2: 28 mmol/L (ref 22–32)
Calcium: 9.2 mg/dL (ref 8.9–10.3)
Chloride: 101 mmol/L (ref 98–111)
Creatinine, Ser: 0.64 mg/dL (ref 0.61–1.24)
GFR calc Af Amer: >60 mL/min (ref >60)
GFR calc non Af Amer: >60 mL/min (ref >60)
Glucose, Bld: 122 mg/dL — ABNORMAL HIGH (ref 70–99)
Potassium: 4 mmol/L (ref 3.5–5.1)
Sodium: 138 mmol/L (ref 135–145)

## 2019-01-28 LAB — CBC
HCT: 42.3 % (ref 39.0–52.0)
Hemoglobin: 13.4 g/dL (ref 13.0–17.0)
MCH: 27.1 pg (ref 26.0–34.0)
MCHC: 31.7 g/dL (ref 30.0–36.0)
MCV: 85.6 fL (ref 80.0–100.0)
Platelets: 183 10*3/uL (ref 150–400)
RBC: 4.94 MIL/uL (ref 4.22–5.81)
RDW: 19.7 % — ABNORMAL HIGH (ref 11.5–15.5)
WBC: 5.6 10*3/uL (ref 4.0–10.5)
nRBC: 0 % (ref 0.0–0.2)

## 2019-01-28 MED ORDER — FERROUS SULFATE 325 (65 FE) MG PO TABS
325.0000 mg | ORAL_TABLET | Freq: Three times a day (TID) | ORAL | Status: DC
  Administered 2019-01-29 – 2019-01-30 (×4): 325 mg via ORAL
  Filled 2019-01-28 (×4): qty 1

## 2019-01-28 MED ORDER — FUROSEMIDE 10 MG/ML IJ SOLN
40.0000 mg | Freq: Two times a day (BID) | INTRAMUSCULAR | Status: DC
  Administered 2019-01-28 – 2019-01-29 (×2): 40 mg via INTRAVENOUS
  Filled 2019-01-28 (×2): qty 4

## 2019-01-28 MED ORDER — ALBUTEROL SULFATE HFA 108 (90 BASE) MCG/ACT IN AERS: 2.0000 | INHALATION_SPRAY | RESPIRATORY_TRACT | Status: DC | PRN

## 2019-01-28 MED ORDER — LORAZEPAM 1 MG PO TABS: 1.0000 mg | ORAL_TABLET | Freq: Four times a day (QID) | ORAL | Status: DC | PRN

## 2019-01-28 MED ORDER — HYDRALAZINE HCL 50 MG PO TABS
50.0000 mg | ORAL_TABLET | Freq: Three times a day (TID) | ORAL | Status: DC
  Administered 2019-01-28 – 2019-01-30 (×7): 50 mg via ORAL
  Filled 2019-01-28 (×7): qty 1

## 2019-01-28 MED ORDER — LABETALOL HCL 5 MG/ML IV SOLN
10.0000 mg | Freq: Once | INTRAVENOUS | Status: AC
  Administered 2019-01-28: 10 mg via INTRAVENOUS
  Filled 2019-01-28: qty 4

## 2019-01-28 MED ORDER — CARVEDILOL 25 MG PO TABS
25.0000 mg | ORAL_TABLET | Freq: Two times a day (BID) | ORAL | Status: DC
  Administered 2019-01-28 – 2019-01-30 (×3): 25 mg via ORAL
  Filled 2019-01-28 (×3): qty 1

## 2019-01-28 MED ORDER — FOLIC ACID 1 MG PO TABS
1.0000 mg | ORAL_TABLET | Freq: Every day | ORAL | Status: DC
  Administered 2019-01-29 – 2019-01-30 (×2): 1 mg via ORAL
  Filled 2019-01-28 (×2): qty 1

## 2019-01-28 MED ORDER — ALBUTEROL SULFATE (2.5 MG/3ML) 0.083% IN NEBU
2.5000 mg | INHALATION_SOLUTION | Freq: Four times a day (QID) | RESPIRATORY_TRACT | Status: DC | PRN
  Administered 2019-01-28: 2.5 mg via RESPIRATORY_TRACT
  Filled 2019-01-28: qty 3

## 2019-01-28 MED ORDER — ENOXAPARIN SODIUM 40 MG/0.4ML ~~LOC~~ SOLN
40.0000 mg | SUBCUTANEOUS | Status: DC
  Administered 2019-01-28 – 2019-01-29 (×2): 40 mg via SUBCUTANEOUS
  Filled 2019-01-28 (×2): qty 0.4

## 2019-01-28 MED ORDER — SIMVASTATIN 20 MG PO TABS
20.0000 mg | ORAL_TABLET | Freq: Every day | ORAL | Status: DC
  Administered 2019-01-28 – 2019-01-29 (×2): 20 mg via ORAL
  Filled 2019-01-28 (×2): qty 1

## 2019-01-28 MED ORDER — ISOSORBIDE MONONITRATE ER 30 MG PO TB24
30.0000 mg | ORAL_TABLET | Freq: Every day | ORAL | Status: DC
  Administered 2019-01-28 – 2019-01-30 (×3): 30 mg via ORAL
  Filled 2019-01-28 (×3): qty 1

## 2019-01-28 MED ORDER — HYDRALAZINE HCL 20 MG/ML IJ SOLN
10.0000 mg | Freq: Four times a day (QID) | INTRAMUSCULAR | Status: DC | PRN
  Administered 2019-01-28: 10 mg via INTRAVENOUS
  Filled 2019-01-28: qty 1

## 2019-01-28 MED ORDER — THIAMINE HCL 100 MG/ML IJ SOLN: 100.0000 mg | Freq: Every day | INTRAMUSCULAR | Status: DC

## 2019-01-28 MED ORDER — SODIUM CHLORIDE 0.9% FLUSH
3.0000 mL | INTRAVENOUS | Status: DC | PRN
  Administered 2019-01-28: 3 mL via INTRAVENOUS
  Filled 2019-01-28: qty 3

## 2019-01-28 MED ORDER — SODIUM CHLORIDE 0.9% FLUSH
3.0000 mL | Freq: Two times a day (BID) | INTRAVENOUS | Status: DC
  Administered 2019-01-29 – 2019-01-30 (×3): 3 mL via INTRAVENOUS

## 2019-01-28 MED ORDER — ONDANSETRON HCL 4 MG/2ML IJ SOLN: 4.0000 mg | Freq: Four times a day (QID) | INTRAMUSCULAR | Status: DC | PRN

## 2019-01-28 MED ORDER — NICOTINE 21 MG/24HR TD PT24
21.0000 mg | MEDICATED_PATCH | Freq: Every day | TRANSDERMAL | Status: DC
  Administered 2019-01-28 – 2019-01-30 (×3): 21 mg via TRANSDERMAL
  Filled 2019-01-28 (×3): qty 1

## 2019-01-28 MED ORDER — AMLODIPINE BESYLATE 5 MG PO TABS
5.0000 mg | ORAL_TABLET | Freq: Every day | ORAL | Status: DC
  Administered 2019-01-28 – 2019-01-30 (×3): 5 mg via ORAL
  Filled 2019-01-28 (×4): qty 1

## 2019-01-28 MED ORDER — ACETAMINOPHEN 325 MG PO TABS
650.0000 mg | ORAL_TABLET | ORAL | Status: DC | PRN
  Administered 2019-01-29: 650 mg via ORAL
  Filled 2019-01-28: qty 2

## 2019-01-28 MED ORDER — ADULT MULTIVITAMIN W/MINERALS CH
1.0000 | ORAL_TABLET | Freq: Every day | ORAL | Status: DC
  Administered 2019-01-29 – 2019-01-30 (×2): 1 via ORAL
  Filled 2019-01-28 (×2): qty 1

## 2019-01-28 MED ORDER — SODIUM CHLORIDE 0.9 % IV SOLN: 250.0000 mL | INTRAVENOUS | Status: DC | PRN

## 2019-01-28 MED ORDER — VITAMIN B-1 100 MG PO TABS
100.0000 mg | ORAL_TABLET | Freq: Every day | ORAL | Status: DC
  Administered 2019-01-29 – 2019-01-30 (×2): 100 mg via ORAL
  Filled 2019-01-28 (×2): qty 1

## 2019-01-28 MED ORDER — FUROSEMIDE 10 MG/ML IJ SOLN
40.0000 mg | Freq: Once | INTRAMUSCULAR | Status: AC
  Administered 2019-01-28: 40 mg via INTRAVENOUS
  Filled 2019-01-28: qty 4

## 2019-01-28 MED ORDER — LISINOPRIL 20 MG PO TABS
20.0000 mg | ORAL_TABLET | Freq: Every day | ORAL | Status: DC
  Administered 2019-01-28 – 2019-01-30 (×3): 20 mg via ORAL
  Filled 2019-01-28: qty 1
  Filled 2019-01-28: qty 2
  Filled 2019-01-28 (×2): qty 1

## 2019-01-28 MED ORDER — LORAZEPAM 2 MG/ML IJ SOLN
1.0000 mg | Freq: Four times a day (QID) | INTRAMUSCULAR | Status: DC | PRN
  Administered 2019-01-28: 1 mg via INTRAVENOUS
  Filled 2019-01-28: qty 1

## 2019-01-28 NOTE — ED Notes (Signed)
Pt ambulated in hallway with continuous pulse ox. Sat decresed to 90% with increased WOB. EDP Kinner notified.

## 2019-01-28 NOTE — ED Notes (Signed)
Pt c/o mild wheezing and increased SOB. Given PRN albuterol treatment as ordered. Pt states improvement in SOB. O2 sat 97% on RA. No cough.

## 2019-01-28 NOTE — ED Notes (Signed)
Pt denies SHOB and does not feel like needs neb at this time.

## 2019-01-28 NOTE — ED Notes (Signed)
ED TO INPATIENT HANDOFF REPORT  ED Nurse Name and Phone #:  Elmo Putt #[phone]  S Name/Age/Gender Jose [name]. 48 y.o. male Room/Bed: ED18A/ED18A  Code Status   Code Status: Not on file  Home/SNF/Other Home Patient oriented to: self, place, time and situation Is this baseline? Yes   Triage Complete: Triage complete  Chief Complaint chest pain ems  Triage Note Started with chest pressure this AM. Has been out of blood pressure.  220/138 bp with EMS. Has taken 324 mg ASA by pt. 2 in NTG paste to right chest by EMS. Pressure has relieved with NTG.  Pain also seemed to ease with rest when at work. No hx of same. DOE   Allergies No Known Allergies  Level of Care/Admitting Diagnosis ED Disposition    ED Disposition Condition [name]: [place] [[id number]]  Level of Care: Telemetry [5]  Diagnosis: Acute CHF [facility]) [[id number]]  Admitting Physician: Dustin Flock [[id number]]  Attending Physician: Dustin Flock [[id number]]  Estimated length of stay: past midnight tomorrow  Certification:: I certify this patient will need inpatient services for at least 2 midnights  PT Class (Do Not Modify): Inpatient [101]  PT Acc Code (Do Not Modify): Private [1]       B Medical/Surgery History Past Medical History:  Diagnosis Date  . Alcohol abuse   . Cervicalgia   . Hyperlipidemia   . Hypertension   . Seizures ([place])    Past Surgical History:  Procedure Laterality Date  . COLONOSCOPY WITH PROPOFOL N/A 01/16/2018   Procedure: COLONOSCOPY WITH PROPOFOL;  Surgeon: Lin Landsman, MD;  Location: [facility] ENDOSCOPY;  Service: Gastroenterology;  Laterality: N/A;  . ESOPHAGOGASTRODUODENOSCOPY (EGD) WITH PROPOFOL N/A 01/16/2018   Procedure: ESOPHAGOGASTRODUODENOSCOPY (EGD) WITH PROPOFOL;  Surgeon: Lin Landsman, MD;  Location: [facility] ENDOSCOPY;  Service: Gastroenterology;  Laterality: N/A;     A IV Location/Drains/Wounds Patient Lines/Drains/Airways  Status   Active Line/Drains/Airways    Name:   Placement date:   Placement time:   Site:   Days:   Peripheral IV 01/28/19 Left Antecubital   01/28/19    0953    Antecubital   less than 1          Intake/Output Last 24 hours No intake or output data in the 24 hours ending 01/28/19 1437  Labs/Imaging Results for orders placed or performed during the hospital encounter of 01/28/19 (from the past 48 hour(s))  Basic metabolic panel     Status: Abnormal   Collection Time: 01/28/19  9:49 AM  Result Value Ref Range   Sodium 138 135 - 145 mmol/L   Potassium 4.0 3.5 - 5.1 mmol/L   Chloride 101 98 - 111 mmol/L   CO2 28 22 - 32 mmol/L   Glucose, Bld 122 (H) 70 - 99 mg/dL   BUN 8 6 - 20 mg/dL   Creatinine, Ser 0.64 0.61 - 1.24 mg/dL   Calcium 9.2 8.9 - 10.3 mg/dL   GFR calc non Af Amer >60 >60 mL/min   GFR calc Af Amer >60 >60 mL/min   Anion gap 9 5 - 15    Comment: Performed at [facility], [name] [place], [place] [address]  CBC     Status: Abnormal   Collection Time: 01/28/19  9:49 AM  Result Value Ref Range   WBC 5.6 4.0 - 10.5 K/uL   RBC 4.94 4.22 - 5.81 MIL/uL   Hemoglobin 13.4 13.0 - 17.0 g/dL  HCT 42.3 39.0 - 52.0 %   MCV 85.6 80.0 - 100.0 fL   MCH 27.1 26.0 - 34.0 pg   MCHC 31.7 30.0 - 36.0 g/dL   RDW 19.7 (H) 11.5 - 15.5 %   Platelets 183 150 - 400 K/uL   nRBC 0.0 0.0 - 0.2 %    Comment: Performed at [facility], [name] [place], [name] [address]  Troponin I - ONCE - STAT     Status: None   Collection Time: 01/28/19  9:49 AM  Result Value Ref Range   Troponin I <0.03 <0.03 ng/mL    Comment: Performed at [facility], [name] [place], [place] [address]   Dg Chest Portable 1 View  Result Date: 01/28/2019 CLINICAL DATA:  Chest pressure since the morning, elevated blood pressure, relief with nitroglycerin, history hypertension EXAM: PORTABLE CHEST 1 VIEW COMPARISON:  Portable exam 0951 hours without priors for  comparison FINDINGS: Enlargement of cardiac silhouette with pulmonary vascular congestion. Mediastinal contours normal. Lungs clear. No infiltrate, pleural effusion or pneumothorax. IMPRESSION: Enlargement of cardiac silhouette with pulmonary vascular congestion. No acute infiltrate. Electronically Signed   By: Lavonia Dana M.D.   On: 01/28/2019 10:04    Pending Labs Unresulted Labs (From admission, onward)    Start     Ordered   01/28/19 1435  Troponin I - Now Then Q6H  Now then every 6 hours,   STAT     01/28/19 1434   Signed and Held  HIV antibody (Routine Testing)  Once,   R     Signed and Held   Signed and Held  Basic metabolic panel  Daily,   R     Signed and Held   Signed and Held  CBC  (enoxaparin (LOVENOX)    CrCl >/= 30 ml/min)  Once,   R    Comments:  Baseline for enoxaparin therapy IF NOT ALREADY DRAWN.  Notify MD if PLT < 100 K.    Signed and Held   Signed and Held  Creatinine, serum  (enoxaparin (LOVENOX)    CrCl >/= 30 ml/min)  Once,   R    Comments:  Baseline for enoxaparin therapy IF NOT ALREADY DRAWN.    Signed and Held   Signed and Held  Creatinine, serum  (enoxaparin (LOVENOX)    CrCl >/= 30 ml/min)  Weekly,   R    Comments:  while on enoxaparin therapy    Signed and Held          Vitals/Pain Today's Vitals   01/28/19 1300 01/28/19 1330 01/28/19 1400 01/28/19 1430  BP: (!) 173/127 (!) 170/121 (!) 162/129 (!) 194/126  Pulse: 76 87 83 84  Resp: (!) 23 15 20 15   Temp:      TempSrc:      SpO2: 96% 96% 98% 96%  Weight:      Height:        Isolation Precautions No active isolations  Medications Medications  albuterol (PROVENTIL) (2.5 MG/3ML) 0.083% nebulizer solution 2.5 mg (has no administration in time range)  nicotine (NICODERM CQ - dosed in mg/24 hours) patch 21 mg (has no administration in time range)  labetalol (NORMODYNE,TRANDATE) injection 10 mg (10 mg Intravenous Given 01/28/19 1033)  furosemide (LASIX) injection 40 mg (40 mg Intravenous Given  01/28/19 1033)    Mobility walks Low fall risk   Focused Assessments Cardiac Assessment Handoff:  Cardiac Rhythm: Normal sinus rhythm Lab Results  Component Value Date  TROPONINI <0.03 01/28/2019   No results found for: DDIMER Does the Patient currently have chest pain? No  , Pulmonary Assessment Handoff:  Lung sounds:  diminished bil O2 Device: Room Air        R Recommendations: See Admitting Provider Note  Report given to:   Additional Notes:

## 2019-01-28 NOTE — H&P (Signed)
[place] [facility] [place] NAME: Jose [name]    MR#:  [id number]  DATE OF BIRTH:  [birth year]  DATE OF ADMISSION:  01/28/2019  PRIMARY CARE PHYSICIAN: Guadalupe Maple, MD   REQUESTING/REFERRING PHYSICIAN:   CHIEF COMPLAINT:   Chief Complaint  Patient presents with  . Chest Pain    HISTORY OF PRESENT ILLNESS: Jose [name]  is a 48 y.o. male with a known history of alchol abuse in past, htn, hyperlipdemia and seizures presents to ED with c/o chest pain and sob  Pt has history of HTN and hyperlipdemia as well as seizures he says stop taking his meds abut one month ago. He presents with very high bp and noted to have CXR c/w Chf. Patient reports chest pain.  Patient also complains of cough and congestion.  Patient also complains of feeling sweaty.  Denies any nausea vomiting or diarrhea.    PAST MEDICAL HISTORY:   Past Medical History:  Diagnosis Date  . Alcohol abuse   . Cervicalgia   . Hyperlipidemia   . Hypertension   . Seizures ([place])     PAST SURGICAL HISTORY:  Past Surgical History:  Procedure Laterality Date  . COLONOSCOPY WITH PROPOFOL N/A 01/16/2018   Procedure: COLONOSCOPY WITH PROPOFOL;  Surgeon: Lin Landsman, MD;  Location: [facility] ENDOSCOPY;  Service: Gastroenterology;  Laterality: N/A;  . ESOPHAGOGASTRODUODENOSCOPY (EGD) WITH PROPOFOL N/A 01/16/2018   Procedure: ESOPHAGOGASTRODUODENOSCOPY (EGD) WITH PROPOFOL;  Surgeon: Lin Landsman, MD;  Location: [facility] ENDOSCOPY;  Service: Gastroenterology;  Laterality: N/A;    SOCIAL HISTORY:  Social History   Tobacco Use  . Smoking status: Current Every Day Smoker    Packs/day: 0.50  . Smokeless tobacco: Former Network engineer Use Topics  . Alcohol use: Yes    Alcohol/week: 28.0 standard drinks    Types: 7 Cans of beer, 21 Shots of liquor per week    Comment: half pint liquor per day    FAMILY HISTORY:  Family History  Problem Relation Age of Onset  . Diabetes Mother   . Colon  cancer Mother   . Hypertension Father   . Multiple sclerosis Father     DRUG ALLERGIES: No Known Allergies  REVIEW OF SYSTEMS:   CONSTITUTIONAL: No fever, positive fatigue or positive weakness.  EYES: No blurred or double vision.  EARS, NOSE, AND THROAT: No tinnitus or ear pain.  RESPIRATORY: No cough, positive shortness of breath, wheezing or hemoptysis.  CARDIOVASCULAR: No chest pain, positive orthopnea, edema.  GASTROINTESTINAL: No nausea, vomiting, diarrhea or abdominal pain.  GENITOURINARY: No dysuria, hematuria.  ENDOCRINE: No polyuria, nocturia,  HEMATOLOGY: No anemia, easy bruising or bleeding SKIN: No rash or lesion. MUSCULOSKELETAL: No joint pain or arthritis.   NEUROLOGIC: No tingling, numbness, weakness.  PSYCHIATRY: No anxiety or depression.   MEDICATIONS AT HOME:  Prior to Admission medications   Medication Sig Start Date End Date Taking? Authorizing Provider  amLODipine (NORVASC) 5 MG tablet Take 1 tablet (5 mg total) by mouth daily. 12/21/17 01/28/19 Yes Veronese, [place], MD  carbamazepine (TEGRETOL-XR) 200 MG 12 hr tablet Take 1 tablet (200 mg total) by mouth 2 (two) times daily. 12/21/17 01/28/19 Yes Veronese, [place], MD  hydrochlorothiazide (HYDRODIURIL) 12.5 MG tablet Take 1 tablet (12.5 mg total) by mouth daily. 12/21/17  Yes Alfred Levins, [place], MD  lisinopril (PRINIVIL,ZESTRIL) 10 MG tablet Take 2 tablets (20 mg total) by mouth daily. 12/21/17 01/28/19 Yes Alfred Levins, [place], MD  simvastatin (ZOCOR) 20 MG tablet  Take 1 tablet (20 mg total) by mouth daily. 01/09/17  Yes Kathrine Haddock, NP  ferrous sulfate 325 (65 FE) MG EC tablet Take 1 tablet (325 mg total) by mouth 3 (three) times daily with meals. 01/16/18 04/16/18  Lin Landsman, MD  metroNIDAZOLE (FLAGYL) 500 MG tablet 4 PO X 1 02/22/16   [provider]      PHYSICAL EXAMINATION:   VITAL SIGNS: Blood pressure (!) 170/121, pulse 87, temperature 98.9 F (37.2 C), temperature source Oral, resp.  rate 15, height 5\' 11"  (1.803 m), weight 104.3 kg, SpO2 96 %.  GENERAL:  48 y.o.-year-old patient lying in the bed with no acute distress.  EYES: Pupils equal, round, reactive to light and accommodation. No scleral icterus. Extraocular muscles intact.  HEENT: Head atraumatic, normocephalic. Oropharynx and nasopharynx clear.  NECK:  Supple, no jugular venous distention. No thyroid enlargement, no tenderness.  LUNGS: Bilateral crackles at the bases no accessory muscle usage CARDIOVASCULAR: S1, S2 normal. No murmurs, rubs, or gallops.  ABDOMEN: Soft, nontender, nondistended. Bowel sounds present. No organomegaly or mass.  EXTREMITIES: Trace pedal edema, cyanosis, or clubbing.  NEUROLOGIC: Cranial nerves II through XII are intact. Muscle strength 5/5 in all extremities. Sensation intact. Gait not checked.  PSYCHIATRIC: The patient is alert and oriented x 3.  SKIN: No obvious rash, lesion, or ulcer.   LABORATORY PANEL:   CBC Recent Labs  Lab 01/28/19 0949  WBC 5.6  HGB 13.4  HCT 42.3  PLT 183  MCV 85.6  MCH 27.1  MCHC 31.7  RDW 19.7*   ------------------------------------------------------------------------------------------------------------------  Chemistries  Recent Labs  Lab 01/28/19 0949  NA 138  K 4.0  CL 101  CO2 28  GLUCOSE 122*  BUN 8  CREATININE 0.64  CALCIUM 9.2   ------------------------------------------------------------------------------------------------------------------ estimated creatinine clearance is 137.3 mL/min (by C-G formula based on SCr of 0.64 mg/dL). ------------------------------------------------------------------------------------------------------------------ No results for input(s): TSH, T4TOTAL, T3FREE, THYROIDAB in the last 72 hours.  Invalid input(s): FREET3   Coagulation profile No results for input(s): INR, PROTIME in the last 168  hours. ------------------------------------------------------------------------------------------------------------------- No results for input(s): DDIMER in the last 72 hours. -------------------------------------------------------------------------------------------------------------------  Cardiac Enzymes Recent Labs  Lab 01/28/19 0949  TROPONINI <0.03   ------------------------------------------------------------------------------------------------------------------ Invalid input(s): POCBNP  ---------------------------------------------------------------------------------------------------------------  Urinalysis    Component Value Date/Time   COLORURINE YELLOW (A) 01/12/2018 1118   APPEARANCEUR CLEAR (A) 01/12/2018 1118   LABSPEC 1.015 01/12/2018 1118   PHURINE 6.0 01/12/2018 1118   GLUCOSEU NEGATIVE 01/12/2018 1118   HGBUR NEGATIVE 01/12/2018 1118   BILIRUBINUR NEGATIVE 01/12/2018 1118   KETONESUR NEGATIVE 01/12/2018 1118   PROTEINUR NEGATIVE 01/12/2018 1118   NITRITE NEGATIVE 01/12/2018 1118   LEUKOCYTESUR NEGATIVE 01/12/2018 1118     RADIOLOGY: Dg Chest Portable 1 View  Result Date: 01/28/2019 CLINICAL DATA:  Chest pressure since the morning, elevated blood pressure, relief with nitroglycerin, history hypertension EXAM: PORTABLE CHEST 1 VIEW COMPARISON:  Portable exam 0951 hours without priors for comparison FINDINGS: Enlargement of cardiac silhouette with pulmonary vascular congestion. Mediastinal contours normal. Lungs clear. No infiltrate, pleural effusion or pneumothorax. IMPRESSION: Enlargement of cardiac silhouette with pulmonary vascular congestion. No acute infiltrate. Electronically Signed   By: Lavonia Dana M.D.   On: 01/28/2019 10:04    EKG: Orders placed or performed during the hospital encounter of 01/28/19  . ED EKG  . ED EKG  . EKG 12-Lead  . EKG 12-Lead    IMPRESSION AND PLAN: Patient is 48 year old noncompliant with medications presents with  shortness of breath chest pain  1.  Acute CHF type unknown we will admit to the hospital cardiology consult will be obtained Dr. Nehemiah Massed has been notified We will treat with IV Lasix Patient on ACE inhibitor at home I will restart We will also start patient on Coreg  2.  Chest pain we will do serial cardiac enzymes do a stress test in the morning  3.  Accelerated hypertension patient is started on Imdur continue ACE inhibitor, also start patient on hydralazine  4.  Hyperlipidemia check lipid panel in the a.m.  5.  Nicotine abuse smoking cessation provided 4 minutes spent strongly recommend he stop smoking  6.  Alcohol abuse we will place on CIWA protocol   All the records are reviewed and case discussed with ED provider. Management plans discussed with the patient, family and they are in agreement.  CODE STATUS:full    TOTAL TIME TAKING CARE OF THIS PATIENT: 25minutes.    Dustin Flock M.D on 01/28/2019 at 2:10 PM  Between 7am to 6pm - Pager - [phone]  After 6pm go to [email or web address] - password [email or web address] [facility]  [phone]  CC: Primary care physician; Guadalupe Maple, MD

## 2019-01-28 NOTE — ED Provider Notes (Signed)
[facility] Emergency Department Provider Note   ____________________________________________    I have reviewed the triage vital signs and the nursing notes.   HISTORY  Chief Complaint Chest Pain     HPI Jose Rohl. is a 48 y.o. male who presents with complaints of chest tightness and shortness of breath this morning.  Patient reports he got up at 430 this morning as usual was walking up the steps when he felt markedly short of breath as well as had chest tightness.  He is never had this before.  He does report his history of smoking but notes that he has been decreasing.  Does have a history of high blood pressure but ran out of his lisinopril recently.  Currently reports his chest feels mildly tight.  Denies fevers or chills.  No recent travel.  Has not take anything for this.   Past Medical History:  Diagnosis Date  . Alcohol abuse   . Cervicalgia   . Hyperlipidemia   . Hypertension   . Seizures [facility])     Patient Active Problem List   Diagnosis Date Noted  . Acute CHF ([place]) 01/28/2019  . Iron deficiency anemia due to chronic blood loss   . Fall (on) (from) other stairs and steps, initial encounter 01/12/2018  . Family history of colon cancer 06/28/2016  . Rectal bleeding 06/28/2016  . Seizures ([place]) 04/30/2015  . Hypertension 04/30/2015  . ETOH abuse 04/30/2015  . Epilepsy ([place]) 04/30/2015  . Tobacco dependence 04/30/2015  . COPD (chronic obstructive pulmonary disease) ([place]) 04/30/2015  . Uncomplicated asthma 66/04/3015  . Hyperlipidemia 04/30/2015  . Lumbago 04/30/2015  . Hypercholesterolemia 04/30/2015  . Chronic pain 04/30/2015  . Nicotine dependence, uncomplicated 11/15/3233  . Hyperlipidemia, unspecified 04/30/2015  . Neck pain 04/22/2013    Past Surgical History:  Procedure Laterality Date  . COLONOSCOPY WITH PROPOFOL N/A 01/16/2018   Procedure: COLONOSCOPY WITH PROPOFOL;  Surgeon: Lin Landsman, MD;  Location:  [facility] ENDOSCOPY;  Service: Gastroenterology;  Laterality: N/A;  . ESOPHAGOGASTRODUODENOSCOPY (EGD) WITH PROPOFOL N/A 01/16/2018   Procedure: ESOPHAGOGASTRODUODENOSCOPY (EGD) WITH PROPOFOL;  Surgeon: Lin Landsman, MD;  Location: [facility] ENDOSCOPY;  Service: Gastroenterology;  Laterality: N/A;    Prior to Admission medications   Medication Sig Start Date End Date Taking? Authorizing Provider  amLODipine (NORVASC) 5 MG tablet Take 1 tablet (5 mg total) by mouth daily. 12/21/17 01/28/19 Yes Veronese, [place], MD  carbamazepine (TEGRETOL-XR) 200 MG 12 hr tablet Take 1 tablet (200 mg total) by mouth 2 (two) times daily. 12/21/17 01/28/19 Yes Veronese, [place], MD  hydrochlorothiazide (HYDRODIURIL) 12.5 MG tablet Take 1 tablet (12.5 mg total) by mouth daily. 12/21/17  Yes Alfred Levins, [place], MD  lisinopril (PRINIVIL,ZESTRIL) 10 MG tablet Take 2 tablets (20 mg total) by mouth daily. 12/21/17 01/28/19 Yes Veronese, [place], MD  simvastatin (ZOCOR) 20 MG tablet Take 1 tablet (20 mg total) by mouth daily. 01/09/17  Yes Kathrine Haddock, NP  ferrous sulfate 325 (65 FE) MG EC tablet Take 1 tablet (325 mg total) by mouth 3 (three) times daily with meals. 01/16/18 04/16/18  Lin Landsman, MD  metroNIDAZOLE (FLAGYL) 500 MG tablet 4 PO X 1 02/22/16   [provider]     Allergies Patient has no known allergies.  Family History  Problem Relation Age of Onset  . Diabetes Mother   . Colon cancer Mother   . Hypertension Father   . Multiple sclerosis Father     Social History  Social History   Tobacco Use  . Smoking status: Current Every Day Smoker    Packs/day: 0.50  . Smokeless tobacco: Former Network engineer Use Topics  . Alcohol use: Yes    Alcohol/week: 28.0 standard drinks    Types: 7 Cans of beer, 21 Shots of liquor per week    Comment: half pint liquor per day  . Drug use: No    Types: Marijuana    Review of Systems  Constitutional: No fever/chills Eyes: No visual changes.  ENT:  No sore throat. Cardiovascular: As above Respiratory: As above Gastrointestinal: No abdominal pain.  Genitourinary: Negative for dysuria. Musculoskeletal: Negative for back pain. Skin: Negative for rash. Neurological: Negative for headaches or weakness   ____________________________________________   PHYSICAL EXAM:  VITAL SIGNS: ED Triage Vitals  Enc Vitals Group     BP 01/28/19 0949 (!) 195/126     Pulse Rate 01/28/19 0949 82     Resp 01/28/19 0949 17     Temp 01/28/19 0949 98.9 F (37.2 C)     Temp Source 01/28/19 0949 Oral     SpO2 01/28/19 0949 97 %     Weight 01/28/19 0946 104.3 kg (230 lb)     Height 01/28/19 0946 1.803 m (5\' 11" )     Head Circumference --      Peak Flow --      Pain Score --      Pain Loc --      Pain Edu? --      Excl. in [place]? --     Constitutional: Alert and oriented.  Eyes: Conjunctivae are normal.  Head: Atraumatic.  Mouth/Throat: Mucous membranes are moist.    Cardiovascular: Normal rate, regular rhythm. Grossly normal heart sounds.  Good peripheral circulation. Respiratory: Normal respiratory effort.  No retractions. Lungs CTAB. Gastrointestinal: Soft and nontender. No distention.  No CVA tenderness.  Musculoskeletal: No lower extremity tenderness nor edema.  Warm and well perfused Neurologic:  Normal speech and language. No gross focal neurologic deficits are appreciated.  Skin:  Skin is warm, dry and intact. No rash noted. Psychiatric: Mood and affect are normal. Speech and behavior are normal.  ____________________________________________   LABS (all labs ordered are listed, but only abnormal results are displayed)  Labs Reviewed  BASIC METABOLIC PANEL - Abnormal; Notable for the following components:      Result Value   Glucose, Bld 122 (*)    All other components within normal limits  CBC - Abnormal; Notable for the following components:   RDW 19.7 (*)    All other components within normal limits  TROPONIN I  TROPONIN I   TROPONIN I  TROPONIN I   ____________________________________________  EKG  ED ECG REPORT I, Lavonia Drafts, the attending physician, personally viewed and interpreted this ECG.  Date: 01/28/2019  Rhythm: normal sinus rhythm QRS Axis: normal Intervals: normal ST/T Wave abnormalities: normal Narrative Interpretation: no evidence of acute ischemia  ____________________________________________  RADIOLOGY  Chest x-ray demonstrates pulmonary vascular congestion and cardiomegaly ____________________________________________   PROCEDURES  Procedure(s) performed: No  Procedures   Critical Care performed: No ____________________________________________   INITIAL IMPRESSION / ASSESSMENT AND PLAN / ED COURSE  Pertinent labs & imaging results that were available during my care of the patient were reviewed by me and considered in my medical decision making (see chart for details).  Patient presents with significant hypertension with complaints of shortness of breath, mild chest tightness especially with exertion, reports feeling somewhat better now while  lying in bed.  Denies fevers or chills.  No recent travel.  No calf pain or significant swelling.  Does smoke cigarettes.  Pending labs, EKG.  Differential includes COPD, pneumonia, CHF, less likely ACS.  Will give IV labetalol for elevated blood pressure, does have nitro patch on as well placed there by EMS but no significant improvement in blood pressure  Lab work is overall reassuring however chest x-ray demonstrates pulmonary vascular congestion.  I would give IV Lasix.  We did have the patient ambulate and his oxygen levels dropped significantly to 90% and he became more tachypneic.  He is also occasionally diaphoretic.  Will discuss with hospitalist for admission for likely new onset heart failure    ____________________________________________   FINAL CLINICAL IMPRESSION(S) / ED DIAGNOSES  Final diagnoses:  Acute  congestive heart failure, unspecified heart failure type ([place])  Shortness of breath        Note:  This document was prepared using Dragon voice recognition software and may include unintentional dictation errors.   Lavonia Drafts, MD 01/28/19 [id number]

## 2019-01-28 NOTE — ED Triage Notes (Addendum)
Started with chest pressure this AM. Has been out of blood pressure.  220/138 bp with EMS. Has taken 324 mg ASA by pt. 2 in NTG paste to right chest by EMS. Pressure has relieved with NTG.  Pain also seemed to ease with rest when at work. No hx of same. DOE

## 2019-01-29 ENCOUNTER — Encounter: Payer: Self-pay | Admitting: Radiology

## 2019-01-29 ENCOUNTER — Inpatient Hospital Stay: Payer: Self-pay

## 2019-01-29 ENCOUNTER — Inpatient Hospital Stay
Admit: 2019-01-29 | Discharge: 2019-01-29 | Disposition: A | Discharge status: Home / Self Care | Payer: Self-pay | Attending: Internal Medicine | Admitting: Internal Medicine

## 2019-01-29 LAB — NM MYOCAR MULTI W/SPECT W/WALL MOTION / EF
Estimated workload: 1 METS
Exercise duration (min): 1 min
Exercise duration (sec): 36 s
LV dias vol: 86 mL (ref 62–150)
LV sys vol: 35 mL
Peak HR: 107 {beats}/min
Rest HR: 85 {beats}/min
SDS: 3
SRS: 3
SSS: 4
TID: 0.86

## 2019-01-29 LAB — BASIC METABOLIC PANEL
Anion gap: 10 (ref 5–15)
BUN: 12 mg/dL (ref 6–20)
CO2: 26 mmol/L (ref 22–32)
Calcium: 8.8 mg/dL — ABNORMAL LOW (ref 8.9–10.3)
Chloride: 99 mmol/L (ref 98–111)
Creatinine, Ser: 0.67 mg/dL (ref 0.61–1.24)
GFR calc Af Amer: >60 mL/min (ref >60)
GFR calc non Af Amer: >60 mL/min (ref >60)
Glucose, Bld: 116 mg/dL — ABNORMAL HIGH (ref 70–99)
Potassium: 3.3 mmol/L — ABNORMAL LOW (ref 3.5–5.1)
Sodium: 135 mmol/L (ref 135–145)

## 2019-01-29 LAB — TROPONIN I: Troponin I: <0.03 ng/mL (ref <0.03)

## 2019-01-29 MED ORDER — TECHNETIUM TC 99M TETROFOSMIN IV KIT
10.0000 | PACK | Freq: Once | INTRAVENOUS | Status: AC | PRN
  Administered 2019-01-29: 10.97 via INTRAVENOUS

## 2019-01-29 MED ORDER — REGADENOSON 0.4 MG/5ML IV SOLN
0.4000 mg | Freq: Once | INTRAVENOUS | Status: AC
  Administered 2019-01-29: 0.4 mg via INTRAVENOUS

## 2019-01-29 MED ORDER — CARBAMAZEPINE ER 200 MG PO TB12
200.0000 mg | ORAL_TABLET | Freq: Two times a day (BID) | ORAL | Status: DC
  Administered 2019-01-29 – 2019-01-30 (×3): 200 mg via ORAL
  Filled 2019-01-29 (×4): qty 1

## 2019-01-29 MED ORDER — POTASSIUM CHLORIDE CRYS ER 20 MEQ PO TBCR
40.0000 meq | EXTENDED_RELEASE_TABLET | Freq: Once | ORAL | Status: AC
  Administered 2019-01-29: 40 meq via ORAL
  Filled 2019-01-29: qty 2

## 2019-01-29 MED ORDER — TECHNETIUM TC 99M TETROFOSMIN IV KIT
33.1000 | PACK | Freq: Once | INTRAVENOUS | Status: AC | PRN
  Administered 2019-01-29: 33.1 via INTRAVENOUS

## 2019-01-29 NOTE — Consult Note (Signed)
[facility] Consultation Note  Patient ID: Jose [name]., MRN: [id number], DOB/AGE: 04/01/1971 48 y.o. Admit date: 01/28/2019   Date of Consult: 01/29/2019 Primary Physician: Guadalupe Maple, MD Primary Cardiologist: None  Chief Complaint:  Chief Complaint  Patient presents with  . Chest Pain   Reason for Consult: Shortness of breath  HPI: 48 y.o. male with hypertension hyperlipidemia and apparent alcohol use in the past on appropriate medication management for above issues.  The patient has had new onset of significant shortness of breath with physical activity especially going up the steps and down the steps at work yesterday.  It was to the degree that he could not do the things he wished to do.  There was no evidence of significant chest discomfort but there was some uncomfortable feeling.  The patient did come to the emergency room with an EKG showing normal sinus rhythm and normal troponin without evidence of myocardial infarction.  Chest x-ray shows mild vascular congestion and cardiomegaly.  The patient did respond significantly to furosemide for pulmonary edema and now feels much better at this time.  The patient has been ambulating minimally.  Blood pressure is under better control and he is hemodynamically stable  Past Medical History:  Diagnosis Date  . Alcohol abuse   . Cervicalgia   . Hyperlipidemia   . Hypertension   . Seizures [facility])       Surgical History:  Past Surgical History:  Procedure Laterality Date  . COLONOSCOPY WITH PROPOFOL N/A 01/16/2018   Procedure: COLONOSCOPY WITH PROPOFOL;  Surgeon: Lin Landsman, MD;  [facility];  Service: Gastroenterology;  Laterality: N/A;  . ESOPHAGOGASTRODUODENOSCOPY (EGD) WITH PROPOFOL N/A 01/16/2018   Procedure: ESOPHAGOGASTRODUODENOSCOPY (EGD) WITH PROPOFOL;  Surgeon: Lin Landsman, MD;  Location: [facility] ENDOSCOPY;  Service: Gastroenterology;  Laterality: N/A;     Home Meds: Prior to  Admission medications   Medication Sig Start Date End Date Taking? Authorizing Provider  amLODipine (NORVASC) 5 MG tablet Take 1 tablet (5 mg total) by mouth daily. 12/21/17 01/28/19 Yes Veronese, [place], MD  carbamazepine (TEGRETOL-XR) 200 MG 12 hr tablet Take 1 tablet (200 mg total) by mouth 2 (two) times daily. 12/21/17 01/28/19 Yes Veronese, [place], MD  hydrochlorothiazide (HYDRODIURIL) 12.5 MG tablet Take 1 tablet (12.5 mg total) by mouth daily. 12/21/17  Yes Alfred Levins, [place], MD  lisinopril (PRINIVIL,ZESTRIL) 10 MG tablet Take 2 tablets (20 mg total) by mouth daily. 12/21/17 01/28/19 Yes Veronese, [place], MD  simvastatin (ZOCOR) 20 MG tablet Take 1 tablet (20 mg total) by mouth daily. 01/09/17  Yes Kathrine Haddock, NP  ferrous sulfate 325 (65 FE) MG EC tablet Take 1 tablet (325 mg total) by mouth 3 (three) times daily with meals. 01/16/18 04/16/18  Lin Landsman, MD  metroNIDAZOLE (FLAGYL) 500 MG tablet 4 PO X 1 02/22/16   [provider]    Inpatient Medications:  . amLODipine  5 mg Oral Daily  . carvedilol  25 mg Oral BID WC  . enoxaparin (LOVENOX) injection  40 mg Subcutaneous Q24H  . ferrous sulfate  325 mg Oral TID WC  . folic acid  1 mg Oral Daily  . furosemide  40 mg Intravenous BID  . hydrALAZINE  50 mg Oral Q8H  . isosorbide mononitrate  30 mg Oral Daily  . lisinopril  20 mg Oral Daily  . multivitamin with minerals  1 tablet Oral Daily  . nicotine  21 mg Transdermal Daily  . potassium chloride  40 mEq Oral Once  . simvastatin  20 mg Oral Daily  . sodium chloride flush  3 mL Intravenous Q12H  . thiamine  100 mg Oral Daily   Or  . thiamine  100 mg Intravenous Daily   . sodium chloride      Allergies: No Known Allergies  Social History   Socioeconomic History  . Marital status: Widowed    Spouse name: Not on file  . Number of children: Not on file  . Years of education: Not on file  . Highest education level: Not on file  Occupational History  . Not  on file  Social Needs  . Financial resource strain: Not on file  . Food insecurity:    Worry: Not on file    Inability: Not on file  . Transportation needs:    Medical: Not on file    Non-medical: Not on file  Tobacco Use  . Smoking status: Current Every Day Smoker    Packs/day: 0.50  . Smokeless tobacco: Former Network engineer and Sexual Activity  . Alcohol use: Yes    Alcohol/week: 28.0 standard drinks    Types: 7 Cans of beer, 21 Shots of liquor per week    Comment: half pint liquor per day  . Drug use: No    Types: Marijuana  . Sexual activity: Yes  Lifestyle  . Physical activity:    Days per week: Not on file    Minutes per session: Not on file  . Stress: Not on file  Relationships  . Social connections:    Talks on phone: Not on file    Gets together: Not on file    Attends religious service: Not on file    Active member of club or organization: Not on file    Attends meetings of clubs or organizations: Not on file    Relationship status: Not on file  . Intimate partner violence:    Fear of current or ex partner: Not on file    Emotionally abused: Not on file    Physically abused: Not on file    Forced sexual activity: Not on file  Other Topics Concern  . Not on file  Social History Narrative  . Not on file     Family History  Problem Relation Age of Onset  . Diabetes Mother   . Colon cancer Mother   . Hypertension Father   . Multiple sclerosis Father      Review of Systems Positive for redness of breath Negative for: General:  chills, fever, night sweats or weight changes.  Cardiovascular: PND orthopnea syncope dizziness  Dermatological skin lesions rashes Respiratory: Cough congestion Urologic: Frequent urination urination at night and hematuria Abdominal: negative for nausea, vomiting, diarrhea, bright red blood per rectum, melena, or hematemesis Neurologic: negative for visual changes, and/or hearing changes  All other systems reviewed and are  otherwise negative except as noted above.  Labs: Recent Labs    01/28/19 0949 01/28/19 1721 01/28/19 2036 01/29/19 0227  TROPONINI <0.03 <0.03 <0.03 <0.03   Lab Results  Component Value Date   WBC 5.6 01/28/2019   HGB 13.4 01/28/2019   HCT 42.3 01/28/2019   MCV 85.6 01/28/2019   PLT 183 01/28/2019    Recent Labs  Lab 01/29/19 0227  NA 135  K 3.3*  CL 99  CO2 26  BUN 12  CREATININE 0.67  CALCIUM 8.8*  GLUCOSE 116*   Lab Results  Component Value Date   CHOL 211 (  H) 06/28/2016   HDL 47 06/28/2016   LDLCALC 139 (H) 06/28/2016   TRIG 124 06/28/2016   No results found for: DDIMER  Radiology/Studies:  Dg Chest Portable 1 View  Result Date: 01/28/2019 CLINICAL DATA:  Chest pressure since the morning, elevated blood pressure, relief with nitroglycerin, history hypertension EXAM: PORTABLE CHEST 1 VIEW COMPARISON:  Portable exam 0951 hours without priors for comparison FINDINGS: Enlargement of cardiac silhouette with pulmonary vascular congestion. Mediastinal contours normal. Lungs clear. No infiltrate, pleural effusion or pneumothorax. IMPRESSION: Enlargement of cardiac silhouette with pulmonary vascular congestion. No acute infiltrate. Electronically Signed   By: Lavonia Dana M.D.   On: 01/28/2019 10:04    EKG: Normal sinus rhythm  Weights: Filed Weights   01/28/19 0946 01/28/19 1726 01/29/19 0443  Weight: 104.3 kg 106.1 kg 102.7 kg     Physical Exam: Blood pressure (!) 142/96, pulse 87, temperature 98.8 F (37.1 C), temperature source Oral, resp. rate 18, height 5\' 11"  (1.803 m), weight 102.7 kg, SpO2 97 %. Body mass index is 31.58 kg/m. General: Well developed, well nourished, in no acute distress. Head eyes ears nose throat: Normocephalic, atraumatic, sclera non-icteric, no xanthomas, nares are without discharge. No apparent thyromegaly and/or mass  Lungs: Normal respiratory effort.  Few wheezes, no rales, no rhonchi.  Few basilar crackles Heart: RRR with normal  S1 S2. no murmur gallop, no rub, PMI is normal size and placement, carotid upstroke normal without bruit, jugular venous pressure is normal Abdomen: Soft, non-tender, non-distended with normoactive bowel sounds. No hepatomegaly. No rebound/guarding. No obvious abdominal masses. Abdominal aorta is normal size without bruit Extremities: No edema. no cyanosis, no clubbing, no ulcers  Peripheral : 2+ bilateral upper extremity pulses, 2+ bilateral femoral pulses, 2+ bilateral dorsal pedal pulse Neuro: Alert and oriented. No facial asymmetry. No focal deficit. Moves all extremities spontaneously. Musculoskeletal: Normal muscle tone without kyphosis Psych:  Responds to questions appropriately with a normal affect.    Assessment: 48 year old male with essential hypertension mixed hyperlipidemia and apparent alcohol use with cardiomegaly and congestive heart failure pulmonary edema by chest x-ray and symptoms consistent with progression of congestive heart failure without evidence of myocardial infarction  Plan: 1.  Continue intravenous Lasix for improvements of congestive heart failure and pulmonary edema and hypoxia and shortness of breath 2.  Hypertension control with hydralazine isosorbide carvedilol amlodipine combination with goal systolic blood pressure below 130 mm 3.  Consider echocardiogram for LV systolic dysfunction valvular heart disease as primary cause although if unable would do as outpatient 4.  Begin ambulation for further symptoms and adjustments of medication management as per above 5.  If ambulating well and feeling well would discharge to home with further adjustments of medication management for concerns of LV dysfunction and heart failure 6.  Continue high intensity cholesterol therapy 7.  Patient was advised for discontinuation of tobacco abuse  Signed, Corey Skains M.D. [facility] 01/29/2019, 8:27 AM

## 2019-01-29 NOTE — Progress Notes (Signed)
Patients blood pressure 94/62 and 97/77 ( see flowsheet for exact times). MD notifed, per MD hold the scheduled IV lasix and carvedilol. Will hold and continue to monitor patient.

## 2019-01-29 NOTE — Progress Notes (Signed)
[place] at [place] NAME: Jose [name]    MR#:  [id number]  DATE OF BIRTH:  May 26, 1971  SUBJECTIVE:  CHIEF COMPLAINT:   Chief Complaint  Patient presents with  . Chest Pain   No new complaints.  Patient reports improvement in shortness of breath.  No chest pain this morning. REVIEW OF SYSTEMS:  Review of Systems  Constitutional: Negative for chills and fever.  HENT: Negative for hearing loss and tinnitus.   Eyes: Negative for blurred vision and double vision.  Respiratory: Negative for cough.        Shortness of breath improved  Cardiovascular: Negative for chest pain and palpitations.  Gastrointestinal: Negative for heartburn and nausea.  Genitourinary: Negative for dysuria and urgency.  Musculoskeletal: Negative for myalgias and neck pain.  Skin: Negative for itching and rash.  Neurological: Negative for dizziness and headaches.  Psychiatric/Behavioral: Negative for depression and hallucinations.    DRUG ALLERGIES:  No Known Allergies VITALS:  Blood pressure 94/62, pulse 90, temperature 99.1 F (37.3 C), temperature source Oral, resp. rate 18, height 5\' 11"  (1.803 m), weight 102.7 kg, SpO2 96 %. PHYSICAL EXAMINATION:   Physical Exam  Constitutional: He is oriented to person, place, and time. He appears well-developed.  HENT:  Head: Normocephalic.  Right Ear: External ear normal.  Mouth/Throat: Oropharynx is clear and moist.  Eyes: Pupils are equal, round, and reactive to light. Conjunctivae are normal.  Neck: Normal range of motion. Neck supple. No thyromegaly present.  Cardiovascular: Normal rate, regular rhythm and normal heart sounds.  Respiratory: Effort normal. He has rales.  GI: Soft. Bowel sounds are normal. There is no abdominal tenderness.  Musculoskeletal: Normal range of motion.        General: No edema.  Neurological: He is alert and oriented to person, place, and time. No cranial nerve deficit.  Skin: Skin is  warm. He is not diaphoretic. No erythema.  Psychiatric: He has a normal mood and affect. His behavior is normal.   LABORATORY PANEL:  Male CBC Recent Labs  Lab 01/28/19 0949  WBC 5.6  HGB 13.4  HCT 42.3  PLT 183   ------------------------------------------------------------------------------------------------------------------ Chemistries  Recent Labs  Lab 01/29/19 0227  NA 135  K 3.3*  CL 99  CO2 26  GLUCOSE 116*  BUN 12  CREATININE 0.67  CALCIUM 8.8*   RADIOLOGY:  Nm Myocar Multi W/spect W/wall Motion / Ef  Result Date: 01/29/2019  The study is normal.  This is a low risk study.  The left ventricular ejection fraction is normal (55-65%).  Blood pressure demonstrated a normal response to exercise.  There was no ST segment deviation noted during stress.    ASSESSMENT AND PLAN:  Patient is 48 year old noncompliant with medications presents with shortness of breath chest pain  1.  Acute CHF type unknown 2D echocardiogram done but report still pending.  Follow-up on report when read Being diuresed with IV Lasix.  Continue Coreg and ACE inhibitor .  Seen by cardiologist.  Appreciate input  2.  Chest pain ; resolved.   Acute coronary syndrome ruled out.  Stress test done was normal.  Low risk study.    3.   Hypertensive urgency Blood pressure better controlled this morning.  4.  Hyperlipidemia  check lipid panel in the a.m.  5.  Nicotine abuse smoking cessation provided 4 minutes spent strongly recommend he stop smoking  6.  Alcohol abuse  No evidence of alcohol withdrawal or intoxication  at this time. Place on CIWA protocol  DVT prophylaxis; Lovenox   All the records are reviewed and case discussed with Care Management/Social Worker. Management plans discussed with the patient, family and they are in agreement.  CODE STATUS: Full Code  TOTAL TIME TAKING CARE OF THIS PATIENT: 26 minutes.   More than 50% of the time was spent in  counseling/coordination of care: YES  POSSIBLE D/C IN 1 DAY, DEPENDING ON CLINICAL CONDITION.   Jude Ojie M.D on 01/29/2019 at 3:42 PM  Between 7am to 6pm - Pager - [phone]  After 6pm go to [email or web address] - Proofreader  [facility]  Office  [phone]  CC: Primary care physician; Guadalupe Maple, MD  Note: This dictation was prepared with Dragon dictation along with smaller phrase technology. Any transcriptional errors that result from this process are unintentional.

## 2019-01-29 NOTE — Plan of Care (Signed)
Patient states breathing has improved. Continue to educate about heart failure, patient would benefit from a co-learner.

## 2019-01-29 NOTE — Plan of Care (Signed)
Nutrition Education Note  RD consulted for nutrition education regarding new onset CHF.  48 y.o. male with a known history of alchol abuse in past, htn, hyperlipdemia and seizures presents to ED with c/o chest pain and sob.   Spoke with pt via phone  Reviewed patient's dietary recall. Provided examples on ways to decrease sodium intake in diet. Discouraged intake of processed foods and use of salt shaker. Encouraged fresh fruits and vegetables as well as whole grain sources of carbohydrates to maximize fiber intake.   RD discussed why it is important for patient to adhere to diet recommendations, and emphasized the role of fluids, foods to avoid, and importance of weighing self daily. Teach back method used.  Expect fair compliance.  Body mass index is 31.58 kg/m. Pt meets criteria for obesity based on current BMI.  Current diet order is HH, patient is consuming approximately 50-100% of meals at this time.   Labs and medications reviewed.   No further nutrition interventions warranted at this time. RD contact information provided. If additional nutrition issues arise, please re-consult RD.   Koleen Distance MS, RD, LDN Pager #- [phone] Office#- [phone] After Hours Pager: [phone]

## 2019-01-29 NOTE — Progress Notes (Signed)
*  PRELIMINARY RESULTS* Echocardiogram 2D Echocardiogram has been performed.  Jose [name] 01/29/2019, 10:34 AM

## 2019-01-30 ENCOUNTER — Other Ambulatory Visit: Payer: Self-pay

## 2019-01-30 LAB — MAGNESIUM: Magnesium: 1.4 mg/dL — ABNORMAL LOW (ref 1.7–2.4)

## 2019-01-30 LAB — BASIC METABOLIC PANEL
Anion gap: 10 (ref 5–15)
BUN: 31 mg/dL — ABNORMAL HIGH (ref 6–20)
CO2: 25 mmol/L (ref 22–32)
Calcium: 8.9 mg/dL (ref 8.9–10.3)
Chloride: 101 mmol/L (ref 98–111)
Creatinine, Ser: 1.58 mg/dL — ABNORMAL HIGH (ref 0.61–1.24)
GFR calc Af Amer: 59 mL/min — ABNORMAL LOW (ref >60)
GFR calc non Af Amer: 51 mL/min — ABNORMAL LOW (ref >60)
Glucose, Bld: 109 mg/dL — ABNORMAL HIGH (ref 70–99)
Potassium: 3.6 mmol/L (ref 3.5–5.1)
Sodium: 136 mmol/L (ref 135–145)

## 2019-01-30 LAB — LIPID PANEL
Cholesterol: 225 mg/dL — ABNORMAL HIGH (ref 0–200)
HDL: 40 mg/dL — ABNORMAL LOW (ref >40)
LDL Cholesterol: 146 mg/dL — ABNORMAL HIGH (ref 0–99)
Total CHOL/HDL Ratio: 5.6 RATIO
Triglycerides: 193 mg/dL — ABNORMAL HIGH (ref <150)
VLDL: 39 mg/dL (ref 0–40)

## 2019-01-30 LAB — ECHOCARDIOGRAM COMPLETE
Height: 71 in
Weight: 3622.4 oz

## 2019-01-30 LAB — HIV ANTIBODY (ROUTINE TESTING W REFLEX): HIV Screen 4th Generation wRfx: NONREACTIVE

## 2019-01-30 MED ORDER — MAGNESIUM SULFATE 2 GM/50ML IV SOLN
2.0000 g | Freq: Once | INTRAVENOUS | Status: AC
  Administered 2019-01-30: 2 g via INTRAVENOUS
  Filled 2019-01-30: qty 50

## 2019-01-30 MED ORDER — ISOSORBIDE MONONITRATE ER 30 MG PO TB24: 30.0000 mg | ORAL_TABLET | Freq: Every day | ORAL | 0 refills | Status: DC

## 2019-01-30 MED ORDER — HYDRALAZINE HCL 25 MG PO TABS: 25.0000 mg | ORAL_TABLET | Freq: Three times a day (TID) | ORAL | 0 refills | Status: DC

## 2019-01-30 MED ORDER — FUROSEMIDE 20 MG PO TABS: 20.0000 mg | ORAL_TABLET | Freq: Every day | ORAL | 0 refills | Status: DC

## 2019-01-30 MED ORDER — CARVEDILOL 25 MG PO TABS: 25.0000 mg | ORAL_TABLET | Freq: Two times a day (BID) | ORAL | 0 refills | Status: DC

## 2019-01-30 NOTE — Discharge Summary (Signed)
Jose [name] at [place] NAME: Jose [name]    MR#:  [id number]  DATE OF BIRTH:  27-Oct-1971  DATE OF ADMISSION:  01/28/2019   ADMITTING PHYSICIAN: Dustin Flock, MD  DATE OF DISCHARGE: No discharge date for patient encounter.  PRIMARY CARE PHYSICIAN: Guadalupe Maple, MD   ADMISSION DIAGNOSIS:  Shortness of breath [R06.02] Acute congestive heart failure, unspecified heart failure type ([place]) [I50.9] DISCHARGE DIAGNOSIS:  Active Problems:   Acute CHF ([place])  SECONDARY DIAGNOSIS:   Past Medical History:  Diagnosis Date  . Alcohol abuse   . Cervicalgia   . Hyperlipidemia   . Hypertension   . Seizures [facility])    HOSPITAL COURSE:  Chief complaint; chest pain  HISTORY OF PRESENT ILLNESS: Jose [name]  is a 48 y.o. male with a known history of alchol abuse in past, htn, hyperlipdemia and seizures presents to ED with c/o chest pain and sob  Pt has history of HTN and hyperlipdemia as well as seizures he says stop taking his meds abut one month ago. He presents with very high bp and noted to have CXR c/w Chf. Patient reports chest pain.  Patient also complains of cough and congestion.  Patient also complains of feeling sweaty.  Denies any nausea vomiting or diarrhea.   Hospital course; 1.Acute diastolic CHF exacerbation Patient diuresed with IV Lasix 40 mg every 12 hourly with significant improvement clinically.  Patient ambulating with no shortness of breath.  Oxygen saturation of 98% on room air.  Reports significant improvement and request for discharge home today.  2D echocardiogram done during this admission with ejection fraction of 60 to 65%.  Cavity size is normal.  Patient started on Coreg which was continued on discharge.  Was initially on ACE inhibitor which was discontinued due to recent acute kidney injury.  Seen by cardiologist.  Appreciate input.  Follow-up with primary care physician as outpatient.  2.Chest pain ; resolved.   Acute  coronary syndrome ruled out.  Stress test done was normal.  Low risk study.    3.  Hypertensive urgency Blood pressure better controlled with current regimen.  Will not resume lisinopril due to acute kidney injury.  4.Hyperlipidemia  Lipid panel done with LDL of 146.  Continue lifestyle modifications since patient is not a diabetic and LDL less than 190  5.Nicotine abuse smoking cessation provided 4 minutes spent strongly recommend he stop smoking  6.Alcohol abuse  No evidence of alcohol withdrawal or intoxication at this time. Place on CIWA protocol  7.  Acute kidney injury.  Noted increasing creatinine to 1.58. Most likely related to ongoing diuresis with Lasix.  ACE inhibitor discontinued as well.  Held Lasix this morning and dose decreased to 20 mg daily.  Offered to keep patient till tomorrow and recheck renal function in a.m. but patient absolutely prefers to be discharged home.  Patient wishes to follow-up with primary care physician in a few days to recheck chemistry and adjust dose of Lasix as needed.   DISCHARGE CONDITIONS:  Stable CONSULTS OBTAINED:  Treatment Team:  Corey Skains, MD DRUG ALLERGIES:  No Known Allergies DISCHARGE MEDICATIONS:   Allergies as of 01/30/2019   No Known Allergies     Medication List    STOP taking these medications   hydrochlorothiazide 12.5 MG tablet Commonly known as:  HYDRODIURIL   lisinopril 10 MG tablet Commonly known as:  PRINIVIL,ZESTRIL   metroNIDAZOLE 500 MG tablet Commonly known as:  FLAGYL  TAKE these medications   amLODipine 5 MG tablet Commonly known as:  NORVASC Take 1 tablet (5 mg total) by mouth daily.   carbamazepine 200 MG 12 hr tablet Commonly known as:  TEGretol-XR Take 1 tablet (200 mg total) by mouth 2 (two) times daily.   carvedilol 25 MG tablet Commonly known as:  COREG Take 1 tablet (25 mg total) by mouth 2 (two) times daily with a meal.   ferrous sulfate 325 (65 FE) MG EC tablet  Take 1 tablet (325 mg total) by mouth 3 (three) times daily with meals.   furosemide 20 MG tablet Commonly known as:  Lasix Take 1 tablet (20 mg total) by mouth daily.   hydrALAZINE 25 MG tablet Commonly known as:  APRESOLINE Take 1 tablet (25 mg total) by mouth 3 (three) times daily for 30 days.   isosorbide mononitrate 30 MG 24 hr tablet Commonly known as:  IMDUR Take 1 tablet (30 mg total) by mouth daily. Start taking on:  January 31, 2019   simvastatin 20 MG tablet Commonly known as:  ZOCOR Take 1 tablet (20 mg total) by mouth daily.        DISCHARGE INSTRUCTIONS:   DIET:  Cardiac diet DISCHARGE CONDITION:  Stable ACTIVITY:  Activity as tolerated OXYGEN:  Home Oxygen: No.  Oxygen Delivery: room air DISCHARGE LOCATION:  home   If you experience worsening of your admission symptoms, develop shortness of breath, life threatening emergency, suicidal or homicidal thoughts you must seek medical attention immediately by calling 911 or calling your MD immediately  if symptoms less severe.  You Must read complete instructions/literature along with all the possible adverse reactions/side effects for all the Medicines you take and that have been prescribed to you. Take any new Medicines after you have completely understood and accpet all the possible adverse reactions/side effects.   Please note  You were cared for by a hospitalist during your hospital stay. If you have any questions about your discharge medications or the care you received while you were in the hospital after you are discharged, you can call the unit and asked to speak with the hospitalist on call if the hospitalist that took care of you is not available. Once you are discharged, your primary care physician will handle any further medical issues. Please note that NO REFILLS for any discharge medications will be authorized once you are discharged, as it is imperative that you return to your primary care physician  (or establish a relationship with a primary care physician if you do not have one) for your aftercare needs so that they can reassess your need for medications and monitor your lab values.    On the day of Discharge:  VITAL SIGNS:  Blood pressure 114/80, pulse 88, temperature 98.4 F (36.9 C), temperature source Oral, resp. rate 17, height 5\' 11"  (1.803 m), weight 100.6 kg, SpO2 98 %. PHYSICAL EXAMINATION:  GENERAL:  48 y.o.-year-old patient lying in the bed with no acute distress.  EYES: Pupils equal, round, reactive to light and accommodation. No scleral icterus. Extraocular muscles intact.  HEENT: Head atraumatic, normocephalic. Oropharynx and nasopharynx clear.  NECK:  Supple, no jugular venous distention. No thyroid enlargement, no tenderness.  LUNGS: Normal breath sounds bilaterally, no wheezing, rales,rhonchi or crepitation. No use of accessory muscles of respiration.  CARDIOVASCULAR: S1, S2 normal. No murmurs, rubs, or gallops.  ABDOMEN: Soft, non-tender, non-distended. Bowel sounds present. No organomegaly or mass.  EXTREMITIES: No pedal edema, cyanosis, or clubbing.  NEUROLOGIC: Cranial nerves II through XII are intact. Muscle strength 5/5 in all extremities. Sensation intact. Gait not checked.  PSYCHIATRIC: The patient is alert and oriented x 3.  SKIN: No obvious rash, lesion, or ulcer.  DATA REVIEW:   CBC Recent Labs  Lab 01/28/19 0949  WBC 5.6  HGB 13.4  HCT 42.3  PLT 183    Chemistries  Recent Labs  Lab 01/30/19 0440  NA 136  K 3.6  CL 101  CO2 25  GLUCOSE 109*  BUN 31*  CREATININE 1.58*  CALCIUM 8.9  MG 1.4*     Microbiology Results  No results found for this or any previous visit.  RADIOLOGY:  No results found.   Management plans discussed with the patient, family and they are in agreement.  CODE STATUS: Full Code   TOTAL TIME TAKING CARE OF THIS PATIENT: 38 minutes.    Jude Ojie M.D on 01/30/2019 at 1:17 PM  Between 7am to 6pm - Pager -  [phone]  After 6pm go to [email or web address] - Proofreader  [facility] [place] [facility]  Office  [phone]  CC: Primary care physician; Guadalupe Maple, MD   Note: This dictation was prepared with Dragon dictation along with smaller phrase technology. Any transcriptional errors that result from this process are unintentional.

## 2019-02-01 ENCOUNTER — Telehealth: Payer: Self-pay

## 2019-02-01 NOTE — Telephone Encounter (Signed)
Transition Care Management Follow-up Telephone Call  Date of discharge and from where: 01/30/19 [facility]  How have you been since you were released from the hospital? Pt states he is doing okay, denies pain.  Any questions or concerns? No   Items Reviewed:  Did the pt receive and understand the discharge instructions provided? Yes   Medications obtained and verified? Yes   Any new allergies since your discharge? No   Dietary orders reviewed? Yes  Do you have support at home? Yes   Functional Questionnaire: (I = Independent and D = Dependent) ADLs: I  Bathing/Dressing- I  Meal Prep- I  Eating- I  Maintaining continence- I  Transferring/Ambulation- I  Managing Meds- I  Follow up appointments reviewed:   PCP Hospital f/u appt confirmed? No  pt needs appt scheduled for hosp f/u  [facility] f/u appt confirmed? Yes  Scheduled to see Darylene Price on 02/07/19 for virtual visit.  Are transportation arrangements needed? No   If their condition worsens, is the pt aware to call PCP or go to the Emergency Dept.? Yes  Was the patient provided with contact information for the PCP's office or ED? Yes  Was to pt encouraged to call back with questions or concerns? Yes

## 2019-02-04 NOTE — Telephone Encounter (Signed)
Left message with woman who answered line for pt to return call to the office.

## 2019-02-04 NOTE — Telephone Encounter (Signed)
Call pt He needs a hospital follow-up phone call tomorrow at the latest.  Try to schedule him in the morning please

## 2019-02-04 NOTE — Telephone Encounter (Signed)
Appointment scheduled for tomorrow morning with Dr. Jeananne Rama.

## 2019-02-04 NOTE — Telephone Encounter (Signed)
Tried calling patient to schedule telephone visit with Dr. Jeananne Rama. No answer and no VM set up. Will try to call again later.

## 2019-02-05 ENCOUNTER — Encounter: Payer: Self-pay | Admitting: Family Medicine

## 2019-02-05 ENCOUNTER — Ambulatory Visit (INDEPENDENT_AMBULATORY_CARE_PROVIDER_SITE_OTHER): Payer: Self-pay | Admitting: Family Medicine

## 2019-02-05 DIAGNOSIS — E785 Hyperlipidemia, unspecified: Secondary | ICD-10-CM

## 2019-02-05 DIAGNOSIS — I5031 Acute diastolic (congestive) heart failure: Secondary | ICD-10-CM

## 2019-02-05 DIAGNOSIS — I10 Essential (primary) hypertension: Secondary | ICD-10-CM

## 2019-02-05 DIAGNOSIS — F1010 Alcohol abuse, uncomplicated: Secondary | ICD-10-CM

## 2019-02-05 DIAGNOSIS — G40909 Epilepsy, unspecified, not intractable, without status epilepticus: Secondary | ICD-10-CM

## 2019-02-05 MED ORDER — CARVEDILOL 25 MG PO TABS: 25.0000 mg | ORAL_TABLET | Freq: Two times a day (BID) | ORAL | 3 refills | Status: DC

## 2019-02-05 MED ORDER — FUROSEMIDE 20 MG PO TABS: 20.0000 mg | ORAL_TABLET | Freq: Every day | ORAL | 3 refills | Status: DC

## 2019-02-05 MED ORDER — AMLODIPINE BESYLATE 5 MG PO TABS: 5.0000 mg | ORAL_TABLET | Freq: Every day | ORAL | 3 refills | Status: DC

## 2019-02-05 MED ORDER — CARBAMAZEPINE ER 200 MG PO TB12: 200.0000 mg | ORAL_TABLET | Freq: Two times a day (BID) | ORAL | 3 refills | Status: DC

## 2019-02-05 NOTE — Assessment & Plan Note (Signed)
Discussed alcohol cessation

## 2019-02-05 NOTE — Assessment & Plan Note (Signed)
Not taking medications patient will get back on Tegretol

## 2019-02-05 NOTE — Assessment & Plan Note (Signed)
Reviewed heart failure and medications with patient patient to start taking his Lasix again because of 17 pound weight gain discussed by doing this he may avoid going back to the hospital.  Also discussed if he starts urinating red again will need to see urology.

## 2019-02-05 NOTE — Assessment & Plan Note (Signed)
We will not start cholesterol medicine at this time

## 2019-02-05 NOTE — Progress Notes (Signed)
There were no vitals taken for this visit.   Subjective:    Patient ID: Jose Dakins., male    DOB: [birth year], 48 y.o.   MRN: [id number]  HPI: Jose Krotz. is a 48 y.o. male Telemedicine using audio and telecommunications for a synchronous communication visit. Today's visit due to COVID-19 isolation precautions I connected with Jose [name] and verified that I am speaking with the correct person using two identifiers.   I discussed the limitations, risks, security and privacy concerns of performing an evaluation and management service by telephone and the availability of in person appointments. I also discussed with the patient that there may be a patient responsible charge related to this service. The patient expressed understanding and agreed to proceed. The patient's location is home I am at home. Hospital F/u BP check CHF check Reviewed medications with patient and with a great deal of confusion Patient had stopped his furosemide because of redness of his urine which is presumed to be blood.  Since that time the patient has gained 17 pounds. Continues to take isosorbide is taking carvedilol 25 twice a day hydralazine once a day. Is not taking carbamazepine amlodipine or simvastatin States he is only drinking a little alcohol. Not sure if he is ever had seizures and not taking Tegretol On chart review patient's had diagnosis of seizures Patient is back at work  Transition of [facility] Follow up.   Hospital/Facility:[facility] D/C Physician: Jude Ojie D/C Date: 01-30-2019  Records Requested: na Records Received: na Records Reviewed:today   Diagnoses on Discharge: CHF Htn Seizures  Date of interactive Contact within 48 hours of discharge:  Contact was through: phone  Date of 7 day or 14 day face-to-face visit:    7 days 02-05-2019  Outpatient Encounter Medications as of 02/05/2019  Medication Sig  . amLODipine (NORVASC) 5 MG tablet Take 1 tablet (5 mg total) by mouth  daily.  . carbamazepine (TEGRETOL-XR) 200 MG 12 hr tablet Take 1 tablet (200 mg total) by mouth 2 (two) times daily.  . carvedilol (COREG) 25 MG tablet Take 1 tablet (25 mg total) by mouth 2 (two) times daily with a meal.  . furosemide (LASIX) 20 MG tablet Take 1 tablet (20 mg total) by mouth daily.  . hydrALAZINE (APRESOLINE) 25 MG tablet Take 1 tablet (25 mg total) by mouth 3 (three) times daily for 30 days.  . isosorbide mononitrate (IMDUR) 30 MG 24 hr tablet Take 1 tablet (30 mg total) by mouth daily.  . simvastatin (ZOCOR) 20 MG tablet Take 1 tablet (20 mg total) by mouth daily.  . [DISCONTINUED] amLODipine (NORVASC) 5 MG tablet Take 1 tablet (5 mg total) by mouth daily.  . [DISCONTINUED] carbamazepine (TEGRETOL-XR) 200 MG 12 hr tablet Take 1 tablet (200 mg total) by mouth 2 (two) times daily.  . [DISCONTINUED] carvedilol (COREG) 25 MG tablet Take 1 tablet (25 mg total) by mouth 2 (two) times daily with a meal.  . [DISCONTINUED] ferrous sulfate 325 (65 FE) MG EC tablet Take 1 tablet (325 mg total) by mouth 3 (three) times daily with meals.  . [DISCONTINUED] furosemide (LASIX) 20 MG tablet Take 1 tablet (20 mg total) by mouth daily.   No facility-administered encounter medications on file as of 02/05/2019.     Diagnostic Tests Reviewed/Disposition: yes  Consults:reviewed  Discharge Instructions reviewed with pt  Disease/illness Education:done  Home Health/Community Services Discussions/Referrals:na  Establishment or re-establishment of referral orders for community resources:na  Discussion  with other health care providers:na  Assessment and Support of treatment regimen adherence: done  Appointments Coordinated with: na  Education for self-management, independent living, and ADLs: done Relevant past medical, surgical, family and social history reviewed and updated as indicated. Interim medical history since our last visit reviewed. Allergies and medications reviewed and updated.   Review of Systems  Constitutional: Negative.   Respiratory: Negative.   Cardiovascular: Negative.     Per HPI unless specifically indicated above     Objective:    There were no vitals taken for this visit.  Wt Readings from Last 3 Encounters:  01/30/19 221 lb 11.2 oz (100.6 kg)  07/13/18 204 lb (92.5 kg)  06/27/18 204 lb (92.5 kg)    Physical Exam None Results for orders placed or performed during the hospital encounter of 01/28/19  NM Myocar Multi W/Spect W/Wall Motion / EF  Result Value Ref Range   Rest HR 85 bpm   Rest BP 151/97 mmHg   Exercise duration (sec) 36 sec   Exercise duration (min) 1 min   Estimated workload 1.0 METS   Peak HR 107 bpm   Peak BP 168/85 mmHg   SSS 4    SRS 3    SDS 3    TID 0.86    LV sys vol 35 mL   LV dias vol 86 62 - 150 mL  Basic metabolic panel  Result Value Ref Range   Sodium 138 135 - 145 mmol/L   Potassium 4.0 3.5 - 5.1 mmol/L   Chloride 101 98 - 111 mmol/L   CO2 28 22 - 32 mmol/L   Glucose, Bld 122 (H) 70 - 99 mg/dL   BUN 8 6 - 20 mg/dL   Creatinine, Ser 0.64 0.61 - 1.24 mg/dL   Calcium 9.2 8.9 - 10.3 mg/dL   GFR calc non Af Amer >60 >60 mL/min   GFR calc Af Amer >60 >60 mL/min   Anion gap 9 5 - 15  CBC  Result Value Ref Range   WBC 5.6 4.0 - 10.5 K/uL   RBC 4.94 4.22 - 5.81 MIL/uL   Hemoglobin 13.4 13.0 - 17.0 g/dL   HCT 42.3 39.0 - 52.0 %   MCV 85.6 80.0 - 100.0 fL   MCH 27.1 26.0 - 34.0 pg   MCHC 31.7 30.0 - 36.0 g/dL   RDW 19.7 (H) 11.5 - 15.5 %   Platelets 183 150 - 400 K/uL   nRBC 0.0 0.0 - 0.2 %  Troponin I - ONCE - STAT  Result Value Ref Range   Troponin I <0.03 <0.03 ng/mL  Troponin I - Now Then Q6H  Result Value Ref Range   Troponin I <0.03 <0.03 ng/mL  Troponin I - Now Then Q6H  Result Value Ref Range   Troponin I <0.03 <0.03 ng/mL  Troponin I - Now Then Q6H  Result Value Ref Range   Troponin I <0.03 <0.03 ng/mL  HIV antibody (Routine Testing)  Result Value Ref Range   HIV Screen 4th  Generation wRfx Non Reactive Non Reactive  Basic metabolic panel  Result Value Ref Range   Sodium 135 135 - 145 mmol/L   Potassium 3.3 (L) 3.5 - 5.1 mmol/L   Chloride 99 98 - 111 mmol/L   CO2 26 22 - 32 mmol/L   Glucose, Bld 116 (H) 70 - 99 mg/dL   BUN 12 6 - 20 mg/dL   Creatinine, Ser 0.67 0.61 - 1.24 mg/dL   Calcium 8.8 (L)  8.9 - 10.3 mg/dL   GFR calc non Af Amer >60 >60 mL/min   GFR calc Af Amer >60 >60 mL/min   Anion gap 10 5 - 15  Basic metabolic panel  Result Value Ref Range   Sodium 136 135 - 145 mmol/L   Potassium 3.6 3.5 - 5.1 mmol/L   Chloride 101 98 - 111 mmol/L   CO2 25 22 - 32 mmol/L   Glucose, Bld 109 (H) 70 - 99 mg/dL   BUN 31 (H) 6 - 20 mg/dL   Creatinine, Ser 1.58 (H) 0.61 - 1.24 mg/dL   Calcium 8.9 8.9 - 10.3 mg/dL   GFR calc non Af Amer 51 (L) >60 mL/min   GFR calc Af Amer 59 (L) >60 mL/min   Anion gap 10 5 - 15  Magnesium  Result Value Ref Range   Magnesium 1.4 (L) 1.7 - 2.4 mg/dL  Lipid panel  Result Value Ref Range   Cholesterol 225 (H) 0 - 200 mg/dL   Triglycerides 193 (H) <150 mg/dL   HDL 40 (L) >40 mg/dL   Total CHOL/HDL Ratio 5.6 RATIO   VLDL 39 0 - 40 mg/dL   LDL Cholesterol 146 (H) 0 - 99 mg/dL  ECHOCARDIOGRAM COMPLETE  Result Value Ref Range   Weight 3,622.4 oz   Height 71 in   BP 142/96 mmHg      Assessment & Plan:   Problem List Items Addressed This Visit      Cardiovascular and Mediastinum   Hypertension    Poor control and unknown control especially with not taking his medications will restart amlodipine carvedilol using Lasix on a as needed basis      Relevant Medications   amLODipine (NORVASC) 5 MG tablet   carvedilol (COREG) 25 MG tablet   furosemide (LASIX) 20 MG tablet   Other Relevant Orders   Basic metabolic panel   CBC with Differential/Platelet   Acute CHF ([place])    Reviewed heart failure and medications with patient patient to start taking his Lasix again because of 17 pound weight gain discussed by doing this he  may avoid going back to the hospital.  Also discussed if he starts urinating red again will need to see urology.      Relevant Medications   amLODipine (NORVASC) 5 MG tablet   carvedilol (COREG) 25 MG tablet   furosemide (LASIX) 20 MG tablet   Other Relevant Orders   Basic metabolic panel     Nervous and Auditory   Epilepsy ([place])    Not taking medications patient will get back on Tegretol      Relevant Medications   carbamazepine (TEGRETOL-XR) 200 MG 12 hr tablet   Other Relevant Orders   CBC with Differential/Platelet   Urinalysis, Routine w reflex microscopic     Other   Chronic alcohol abuse    Discussed alcohol cessation      Relevant Orders   CBC with Differential/Platelet   Urinalysis, Routine w reflex microscopic   Hyperlipidemia    We will not start cholesterol medicine at this time      Relevant Medications   amLODipine (NORVASC) 5 MG tablet   carvedilol (COREG) 25 MG tablet   furosemide (LASIX) 20 MG tablet     I discussed the assessment and treatment plan with the patient. The patient was provided an opportunity to ask questions and all were answered. The patient agreed with the plan and demonstrated an understanding of the instructions.   The patient was  advised to call back or seek an in-person evaluation if the symptoms worsen or if the condition fails to improve as anticipated.   I provided 21+ minutes of time during this encounter. Discussed limitations with coronavirus and drawing blood work urine specimen to evaluate hematuria renal function and possibility of anemia. Follow up plan: Return for Will need to check blood pressure, medication compliance.

## 2019-02-05 NOTE — Assessment & Plan Note (Signed)
Poor control and unknown control especially with not taking his medications will restart amlodipine carvedilol using Lasix on a as needed basis

## 2019-02-06 NOTE — Addendum Note (Signed)
Addended by: Golden Pop A on: 02/06/2019 10:09 AM   Modules accepted: Orders

## 2019-02-07 ENCOUNTER — Other Ambulatory Visit: Payer: Self-pay

## 2019-02-07 ENCOUNTER — Ambulatory Visit: Payer: Self-pay | Attending: Family | Admitting: Family

## 2019-02-07 ENCOUNTER — Telehealth: Payer: Self-pay

## 2019-02-07 ENCOUNTER — Encounter: Payer: Self-pay | Admitting: Family

## 2019-02-07 VITALS — Wt 227.0 lb

## 2019-02-07 DIAGNOSIS — F17200 Nicotine dependence, unspecified, uncomplicated: Secondary | ICD-10-CM

## 2019-02-07 DIAGNOSIS — I10 Essential (primary) hypertension: Secondary | ICD-10-CM

## 2019-02-07 DIAGNOSIS — I5032 Chronic diastolic (congestive) heart failure: Secondary | ICD-10-CM

## 2019-02-07 NOTE — Telephone Encounter (Signed)
TELEPHONE CALL NOTE  Jose [name]. has been deemed a candidate for a follow-up tele-health visit to limit community exposure during the Covid-19 pandemic. I spoke with the patient via phone to ensure availability of phone/video source, confirm preferred email & phone number, discuss instructions and expectations, and review consent.   I reminded Jose [name]. to be prepared with any vital sign and/or heart rhythm information that could potentially be obtained via home monitoring, at the time of his visit.  Finally, I reminded Jose [name]. to expect an e-mail containing a link for their video-based visit approximately 15 minutes before his visit, or alternatively, a phone call at the time of his visit if his visit is planned to be a phone encounter.  Did the patient verbally consent to treatment as below? YES   Jose [name], CMA 02/07/2019 8:53 AM  CONSENT FOR TELE-HEALTH VISIT - PLEASE REVIEW  I hereby voluntarily request, consent and authorize [facility] and its employed or contracted physicians, physician assistants, nurse practitioners or other licensed health care professionals (the Practitioner), to provide me with telemedicine health care services (the "Services") as deemed necessary by the treating Practitioner. I acknowledge and consent to receive the Services by the Practitioner via telemedicine. I understand that the telemedicine visit will involve communicating with the Practitioner through telephonic communication technology and the disclosure of certain medical information by electronic transmission. I acknowledge that I have been given the opportunity to request an in-person assessment or other available alternative prior to the telemedicine visit and am voluntarily participating in the telemedicine visit.  I understand that I have the right to withhold or withdraw my consent to the use of telemedicine in the course of my care at any time, without affecting my  right to future care or treatment, and that the Practitioner or I may terminate the telemedicine visit at any time. I understand that I have the right to inspect all information obtained and/or recorded in the course of the telemedicine visit and may receive copies of available information for a reasonable fee.  I understand that some of the potential risks of receiving the Services via telemedicine include:  Marland Kitchen Delay or interruption in medical evaluation due to technological equipment failure or disruption; . Information transmitted may not be sufficient (e.g. poor resolution of images) to allow for appropriate medical decision making by the Practitioner; and/or  . In rare instances, security protocols could fail, causing a breach of personal health information.  Furthermore, I acknowledge that it is my responsibility to provide information about my medical history, conditions and care that is complete and accurate to the best of my ability. I acknowledge that Practitioner's advice, recommendations, and/or decision may be based on factors not within their control, such as incomplete or inaccurate data provided by me or lack of visual representation. I understand that the practice of medicine is not an exact science and that Practitioner makes no warranties or guarantees regarding treatment outcomes. I acknowledge that I will receive a copy of this consent concurrently upon execution via email to the email address I last provided but may also request a printed copy by calling the office of [facility].    I understand that my insurance may be billed for this visit.   I have read or had this consent read to me. . I understand the contents of this consent, which adequately explains the benefits and risks of the Services being provided  via telemedicine.  . I have been provided ample opportunity to ask questions regarding this consent and the Services and have had my questions answered to my  satisfaction. . I give my informed consent for the services to be provided through the use of telemedicine in my medical care  By participating in this telemedicine visit I agree to the above.

## 2019-02-07 NOTE — Patient Instructions (Signed)
Begin weighing daily and call for an overnight weight gain of > 2 pounds or a weekly weight gain of >5 pounds.   Low-Sodium Eating Plan Sodium, which is an element that makes up salt, helps you maintain a healthy balance of fluids in your body. Too much sodium can increase your blood pressure and cause fluid and waste to be held in your body. Your health care provider or dietitian may recommend following this plan if you have high blood pressure (hypertension), kidney disease, liver disease, or heart failure. Eating less sodium can help lower your blood pressure, reduce swelling, and protect your heart, liver, and kidneys. What are tips for following this plan? General guidelines  Most people on this plan should limit their sodium intake to 2,000 mg (milligrams) of sodium each day. Reading food labels   The Nutrition Facts label lists the amount of sodium in one serving of the food. If you eat more than one serving, you must multiply the listed amount of sodium by the number of servings.  Choose foods with less than 140 mg of sodium per serving.  Avoid foods with 300 mg of sodium or more per serving. Shopping  Look for lower-sodium products, often labeled as "low-sodium" or "no salt added."  Always check the sodium content even if foods are labeled as "unsalted" or "no salt added".  Buy fresh foods. ? Avoid canned foods and premade or frozen meals. ? Avoid canned, cured, or processed meats  Buy breads that have less than 80 mg of sodium per slice. Cooking  Eat more home-cooked food and less restaurant, buffet, and fast food.  Avoid adding salt when cooking. Use salt-free seasonings or herbs instead of table salt or sea salt. Check with your health care provider or pharmacist before using salt substitutes.  Cook with plant-based oils, such as canola, sunflower, or olive oil. Meal planning  When eating at a restaurant, ask that your food be prepared with less salt or no salt, if  possible.  Avoid foods that contain MSG (monosodium glutamate). MSG is sometimes added to Chinese food, bouillon, and some canned foods. What foods are recommended? The items listed may not be a complete list. Talk with your dietitian about what dietary choices are best for you. Grains Low-sodium cereals, including oats, puffed wheat and rice, and shredded wheat. Low-sodium crackers. Unsalted rice. Unsalted pasta. Low-sodium bread. Whole-grain breads and whole-grain pasta. Vegetables Fresh or frozen vegetables. "No salt added" canned vegetables. "No salt added" tomato sauce and paste. Low-sodium or reduced-sodium tomato and vegetable juice. Fruits Fresh, frozen, or canned fruit. Fruit juice. Meats and other protein foods Fresh or frozen (no salt added) meat, poultry, seafood, and fish. Low-sodium canned tuna and salmon. Unsalted nuts. Dried peas, beans, and lentils without added salt. Unsalted canned beans. Eggs. Unsalted nut butters. Dairy Milk. Soy milk. Cheese that is naturally low in sodium, such as ricotta cheese, fresh mozzarella, or Swiss cheese Low-sodium or reduced-sodium cheese. Cream cheese. Yogurt. Fats and oils Unsalted butter. Unsalted margarine with no trans fat. Vegetable oils such as canola or olive oils. Seasonings and other foods Fresh and dried herbs and spices. Salt-free seasonings. Low-sodium mustard and ketchup. Sodium-free salad dressing. Sodium-free light mayonnaise. Fresh or refrigerated horseradish. Lemon juice. Vinegar. Homemade, reduced-sodium, or low-sodium soups. Unsalted popcorn and pretzels. Low-salt or salt-free chips. What foods are not recommended? The items listed may not be a complete list. Talk with your dietitian about what dietary choices are best for you. Grains Instant hot   cereals. Bread stuffing, pancake, and biscuit mixes. Croutons. Seasoned rice or pasta mixes. Noodle soup cups. Boxed or frozen macaroni and cheese. Regular salted crackers.  Self-rising flour. Vegetables Sauerkraut, pickled vegetables, and relishes. Olives. French fries. Onion rings. Regular canned vegetables (not low-sodium or reduced-sodium). Regular canned tomato sauce and paste (not low-sodium or reduced-sodium). Regular tomato and vegetable juice (not low-sodium or reduced-sodium). Frozen vegetables in sauces. Meats and other protein foods Meat or fish that is salted, canned, smoked, spiced, or pickled. Bacon, ham, sausage, hotdogs, corned beef, chipped beef, packaged lunch meats, salt pork, jerky, pickled herring, anchovies, regular canned tuna, sardines, salted nuts. Dairy Processed cheese and cheese spreads. Cheese curds. Blue cheese. Feta cheese. String cheese. Regular cottage cheese. Buttermilk. Canned milk. Fats and oils Salted butter. Regular margarine. Ghee. Bacon fat. Seasonings and other foods Onion salt, garlic salt, seasoned salt, table salt, and sea salt. Canned and packaged gravies. Worcestershire sauce. Tartar sauce. Barbecue sauce. Teriyaki sauce. Soy sauce, including reduced-sodium. Steak sauce. Fish sauce. Oyster sauce. Cocktail sauce. Horseradish that you find on the shelf. Regular ketchup and mustard. Meat flavorings and tenderizers. Bouillon cubes. Hot sauce and Tabasco sauce. Premade or packaged marinades. Premade or packaged taco seasonings. Relishes. Regular salad dressings. Salsa. Potato and tortilla chips. Corn chips and puffs. Salted popcorn and pretzels. Canned or dried soups. Pizza. Frozen entrees and pot pies. Summary  Eating less sodium can help lower your blood pressure, reduce swelling, and protect your heart, liver, and kidneys.  Most people on this plan should limit their sodium intake to 1,500-2,000 mg (milligrams) of sodium each day.  Canned, boxed, and frozen foods are high in sodium. Restaurant foods, fast foods, and pizza are also very high in sodium. You also get sodium by adding salt to food.  Try to cook at home, eat  more fresh fruits and vegetables, and eat less fast food, canned, processed, or prepared foods. This information is not intended to replace advice given to you by your health care provider. Make sure you discuss any questions you have with your health care provider. Document Released: 04/15/2002 Document Revised: 10/17/2016 Document Reviewed: 10/17/2016 Elsevier Interactive Patient Education  2019 [employer]  

## 2019-02-07 NOTE — Progress Notes (Signed)
Virtual Visit via Telephone Note     Evaluation Performed:  Initial visit  This visit type was conducted due to national recommendations for restrictions regarding the COVID-19 Pandemic (e.g. social distancing).  This format is felt to be most appropriate for this patient at this time.  All issues noted in this document were discussed and addressed.  No physical exam was performed (except for noted visual exam findings with Video Visits).  Please refer to the patient's chart (MyChart message for video visits and phone note for telephone visits) for the patient's consent to telehealth for [facility]  Date:  02/07/2019   ID:  Standley [name]., DOB 1971-03-13, MRN [id number]  Patient Location: [place] [address]   Provider location:   [facility] [place] [address], [place] [address]  PCP:  Guadalupe Maple, MD  Cardiologist:  None Electrophysiologist:  None   Chief Complaint:  Fatigue  History of Present Illness:    Jose [name]. is a 48 y.o. male who presents via audio/video conferencing for a telehealth visit today.  Patient confirmed his DOB and address.   The patient does not have symptoms concerning for COVID-19 infection (fever, chills, cough, or new SHORTNESS OF BREATH).   Patient reports having a mild amount of fatigue upon exertion. He says that this has been present for a few weeks but that today is the best he's felt. He has associated rhinorrhea along with this. He denies any weight gain, difficulty sleeping, sore throat, cough, shortness of breath, chest pain, palpitations, pedal edema, abdominal distention or dizziness. Currently at work on his break and is unsure of what medications he's taking. Says that he's only taking 4 medications although we have 7 listed.   Prior CV studies:   The following studies were reviewed today:  Echo report from 01/29/2019 reviewed and showed an EF of 60-65% with impaired relaxation.    Past Medical History:  Diagnosis Date  . Alcohol abuse   . Cervicalgia   . Hyperlipidemia   . Hypertension   . Seizures ([place])    Past Surgical History:  Procedure Laterality Date  . COLONOSCOPY WITH PROPOFOL N/A 01/16/2018   Procedure: COLONOSCOPY WITH PROPOFOL;  Surgeon: Lin Landsman, MD;  Location: [facility] ENDOSCOPY;  Service: Gastroenterology;  Laterality: N/A;  . ESOPHAGOGASTRODUODENOSCOPY (EGD) WITH PROPOFOL N/A 01/16/2018   Procedure: ESOPHAGOGASTRODUODENOSCOPY (EGD) WITH PROPOFOL;  Surgeon: Lin Landsman, MD;  Location: [facility] ENDOSCOPY;  Service: Gastroenterology;  Laterality: N/A;     Current Meds  Medication Sig  . amLODipine (NORVASC) 5 MG tablet Take 1 tablet (5 mg total) by mouth daily.  . carbamazepine (TEGRETOL-XR) 200 MG 12 hr tablet Take 1 tablet (200 mg total) by mouth 2 (two) times daily.  . carvedilol (COREG) 25 MG tablet Take 1 tablet (25 mg total) by mouth 2 (two) times daily with a meal.  . furosemide (LASIX) 20 MG tablet Take 1 tablet (20 mg total) by mouth daily.     Allergies:   Patient has no known allergies.   Social History   Tobacco Use  . Smoking status: Current Every Day Smoker    Packs/day: 0.50  . Smokeless tobacco: Former Network engineer Use Topics  . Alcohol use: Yes    Alcohol/week: 28.0 standard drinks    Types: 7 Cans of beer, 21 Shots of liquor per week    Comment: half pint liquor per day  . Drug  use: No    Types: Marijuana     Family Hx: The patient's family history includes Colon cancer in his mother; Diabetes in his mother; Hypertension in his father; Multiple sclerosis in his father.  ROS:   Please see the history of present illness.     All other systems reviewed and are negative.   Labs/Other Tests and Data Reviewed:    Recent Labs: 01/28/2019: Hemoglobin 13.4; Platelets 183 01/30/2019: BUN 31; Creatinine, Ser 1.58; Magnesium 1.4; Potassium 3.6; Sodium 136   Recent Lipid Panel Lab Results  Component Value  Date/Time   CHOL 225 (H) 01/30/2019 04:40 AM   CHOL 211 (H) 06/28/2016 09:44 AM   CHOL 233 (H) 07/07/2015 03:53 PM   TRIG 193 (H) 01/30/2019 04:40 AM   TRIG 361 (H) 07/07/2015 03:53 PM   HDL 40 (L) 01/30/2019 04:40 AM   HDL 47 06/28/2016 09:44 AM   CHOLHDL 5.6 01/30/2019 04:40 AM   LDLCALC 146 (H) 01/30/2019 04:40 AM   LDLCALC 139 (H) 06/28/2016 09:44 AM    Wt Readings from Last 3 Encounters:  02/07/19 227 lb (103 kg)  01/30/19 221 lb 11.2 oz (100.6 kg)  07/13/18 204 lb (92.5 kg)     Exam:    Vital Signs:  Wt 227 lb (103 kg) Comment: self-reported  BMI 31.66 kg/m    Well nourished, well developed male in no  acute distress.   ASSESSMENT & PLAN:    1. Chronic heart failure with preserved ejection fraction- - NYHA class II - euvolemic per patient's description - hasn't been weighing daily and he was instructed to weigh daily and call for an overnight weight gain of >2 pounds or a weekly weight gain of >5 pounds - "rarely" adds salt and has been trying to read food labels. Reviewed the importance of closely following a low sodium diet and written dietary information will be sent with his AVS - asked patient to call Jose [name] back once he returns home from work to leave a message regarding what medications he is actually taking   2: HTN- - patient says that his BP has been "good" and thinks the last time he checked it it was 116/85 - saw PCP (Crissman) 02/05/2019 - BMP 01/30/2019 reviewed and showed sodium 136, potassium 3.6, creatinine 1.58 and GFR 59  3: Tobacco use- - says that he's now smoking 2-3 small cigars daily - complete cessation discussed for 2 minutes with him  COVID-19 Education: The signs and symptoms of COVID-19 were discussed with the patient and how to seek care for testing (follow up with PCP or arrange E-visit).  The importance of social distancing was discussed today.  Patient Risk:   After full review of this patients clinical status, I feel that they are at  least moderate risk at this time.  Time:   Today, I have spent 9 minutes with the patient with telehealth technology discussing health status, medications, daily weight and symptoms to call about.     Medication Adjustments/Labs and Tests Ordered: Current medicines are reviewed at length with the patient today.  Concerns regarding medicines are outlined above.   Tests Ordered: No orders of the defined types were placed in this encounter.  Medication Changes: No orders of the defined types were placed in this encounter.   Disposition: Follow-up in 1 month or sooner for any questions/problems before then.   Signed, Alisa Graff, FNP  02/07/2019 10:11 AM    [facility]

## 2019-02-07 NOTE — Telephone Encounter (Signed)
   TELEPHONE CALL NOTE  This patient has been deemed a candidate for follow-up tele-health visit to limit community exposure during the Covid-19 pandemic. I spoke with the patient via phone to discuss instructions. The patient was advised to review the section on consent for treatment as well. The patient will receive a phone call 2-3 days prior to their E-Visit at which time consent will be verbally confirmed. A Virtual Office Visit appointment type has been scheduled for 02/07/2019 with Darylene Price FNP.  Vonda Antigua L, CMA 02/07/2019 8:52 AM

## 2019-02-08 ENCOUNTER — Ambulatory Visit: Payer: Self-pay | Admitting: Pharmacist

## 2019-02-08 ENCOUNTER — Telehealth: Payer: Self-pay

## 2019-02-08 ENCOUNTER — Other Ambulatory Visit: Payer: Self-pay

## 2019-02-08 DIAGNOSIS — I10 Essential (primary) hypertension: Secondary | ICD-10-CM

## 2019-02-08 DIAGNOSIS — I5031 Acute diastolic (congestive) heart failure: Secondary | ICD-10-CM

## 2019-02-08 DIAGNOSIS — F1010 Alcohol abuse, uncomplicated: Secondary | ICD-10-CM

## 2019-02-08 DIAGNOSIS — G40909 Epilepsy, unspecified, not intractable, without status epilepticus: Secondary | ICD-10-CM

## 2019-02-08 LAB — URINALYSIS, ROUTINE W REFLEX MICROSCOPIC
Bilirubin, UA: NEGATIVE
Glucose, UA: NEGATIVE
Ketones, UA: NEGATIVE
Leukocytes,UA: NEGATIVE
Nitrite, UA: NEGATIVE
Protein,UA: NEGATIVE
RBC, UA: NEGATIVE
Specific Gravity, UA: <1.005 — ABNORMAL LOW (ref 1.005–1.030)
Urobilinogen, Ur: 0.2 mg/dL (ref 0.2–1.0)
pH, UA: 5 (ref 5.0–7.5)

## 2019-02-08 NOTE — Chronic Care Management (AMB) (Signed)
  Chronic Care Management   Note  02/08/2019 Name: Jose [name]. MRN: [id number] DOB: 10-12-1971  Patient is a 48 year old male followed by Golden Pop, MD for primary care, referred to chronic care management for support with managing HFpEF, HTN, tobacco use, hyperlipidemia, COPD, specifically for concerns with medication adherence.   Contacted patient, left HIPAA compliant message for him to return my call at his convenience.   Follow up plan: - If I have not heard back, will follow up within 2-3 business days  Catie Darnelle Maffucci, PharmD Clinical Pharmacist [place] [phone]

## 2019-02-09 LAB — CBC WITH DIFFERENTIAL/PLATELET
Basophils Absolute: 0.1 10*3/uL (ref 0.0–0.2)
Basos: 1 %
EOS (ABSOLUTE): 0.1 10*3/uL (ref 0.0–0.4)
Eos: 2 %
Hematocrit: 38.8 % (ref 37.5–51.0)
Hemoglobin: 13 g/dL (ref 13.0–17.7)
Immature Grans (Abs): 0 10*3/uL (ref 0.0–0.1)
Immature Granulocytes: 0 %
Lymphocytes Absolute: 2 10*3/uL (ref 0.7–3.1)
Lymphs: 32 %
MCH: 27.4 pg (ref 26.6–33.0)
MCHC: 33.5 g/dL (ref 31.5–35.7)
MCV: 82 fL (ref 79–97)
Monocytes Absolute: 0.8 10*3/uL (ref 0.1–0.9)
Monocytes: 13 %
Neutrophils Absolute: 3.3 10*3/uL (ref 1.4–7.0)
Neutrophils: 52 %
Platelets: 238 10*3/uL (ref 150–450)
RBC: 4.74 x10E6/uL (ref 4.14–5.80)
RDW: 15.8 % — ABNORMAL HIGH (ref 11.6–15.4)
WBC: 6.4 10*3/uL (ref 3.4–10.8)

## 2019-02-09 LAB — BASIC METABOLIC PANEL
BUN/Creatinine Ratio: 23 — ABNORMAL HIGH (ref 9–20)
BUN: 19 mg/dL (ref 6–24)
CO2: 24 mmol/L (ref 20–29)
Calcium: 10.3 mg/dL — ABNORMAL HIGH (ref 8.7–10.2)
Chloride: 99 mmol/L (ref 96–106)
Creatinine, Ser: 0.83 mg/dL (ref 0.76–1.27)
GFR calc Af Amer: 119 mL/min/{1.73_m2} (ref >59)
GFR calc non Af Amer: 103 mL/min/{1.73_m2} (ref >59)
Glucose: 105 mg/dL — ABNORMAL HIGH (ref 65–99)
Potassium: 4.8 mmol/L (ref 3.5–5.2)
Sodium: 139 mmol/L (ref 134–144)

## 2019-02-12 ENCOUNTER — Ambulatory Visit: Payer: Self-pay | Admitting: Pharmacist

## 2019-02-12 DIAGNOSIS — I5031 Acute diastolic (congestive) heart failure: Secondary | ICD-10-CM

## 2019-02-12 DIAGNOSIS — R569 Unspecified convulsions: Secondary | ICD-10-CM

## 2019-02-12 NOTE — Chronic Care Management (AMB) (Addendum)
**Note Jose-Identified via Obfuscation** Chronic Care Management   Note  02/12/2019 Name: Jose [name]. MRN: [id number] DOB: 1971/03/07   Subjective:  Patient is a 48 year old male followed by Golden Pop, MD for primary care, referred to chronic care management for support in managing heart failure, hypertension, tobacco abuse, hx seizures.    Jose [name] was given information about Chronic Care Management services today including:  1. CCM service includes personalized support from designated clinical staff supervised by his physician, including individualized plan of care and coordination with other care providers 2. 24/7 contact phone numbers for assistance for urgent and routine care needs. 3. Service will only be billed when office clinical staff spend 20 minutes or more in a month to coordinate care. 4. Only one practitioner may furnish and bill the service in a calendar month. 5. The patient may stop CCM services at any time (effective at the end of the month) by phone call to the office staff. 6. The patient will be responsible for cost sharing (co-pay) of up to 20% of the service fee (after annual deductible is met).  Patient agreed to services and verbal consent obtained.   Review of patient status, including review of consultants reports, laboratory and other test data, was performed as part of comprehensive evaluation and provision of chronic care management services.   Objective: Lab Results  Component Value Date   CREATININE 0.83 02/08/2019   CREATININE 1.58 (H) 01/30/2019   CREATININE 0.67 01/29/2019   Lipid Panel     Component Value Date/Time   CHOL 225 (H) 01/30/2019 0440   CHOL 211 (H) 06/28/2016 0944   CHOL 233 (H) 07/07/2015 1553   TRIG 193 (H) 01/30/2019 0440   TRIG 361 (H) 07/07/2015 1553   HDL 40 (L) 01/30/2019 0440   HDL 47 06/28/2016 0944   CHOLHDL 5.6 01/30/2019 0440   VLDL 39 01/30/2019 0440   VLDL 72 (H) 07/07/2015 1553   LDLCALC 146 (H) 01/30/2019 0440   LDLCALC 139 (H) 06/28/2016 0944    BP Readings from Last 3 Encounters:  01/30/19 109/74  07/13/18 112/73  06/27/18 108/72   No Known Allergies  Medications Reviewed Today    Reviewed by Jose [name], [facility] (Pharmacist) on 02/12/19 at 1413  Med List Status: <None>  Medication Order Taking? Sig Documenting Provider Last Dose Status Informant  amLODipine (NORVASC) 5 MG tablet [id number] No Take 1 tablet (5 mg total) by mouth daily.  Patient not taking:  Reported on 02/12/2019   Guadalupe Maple, MD Not Taking Active   carbamazepine (TEGRETOL-XR) 200 MG 12 hr tablet [id number] No Take 1 tablet (200 mg total) by mouth 2 (two) times daily.  Patient not taking:  Reported on 02/12/2019   Guadalupe Maple, MD Not Taking Active   carvedilol (COREG) 25 MG tablet [id number] Yes Take 1 tablet (25 mg total) by mouth 2 (two) times daily with a meal. Crissman, Jeannette How, MD Taking Active   furosemide (LASIX) 20 MG tablet [id number] Yes Take 1 tablet (20 mg total) by mouth daily. Guadalupe Maple, MD Taking Active   hydrALAZINE (APRESOLINE) 25 MG tablet [id number] Yes Take 1 tablet (25 mg total) by mouth 3 (three) times daily for 30 days. Stark Jock Jude, MD Taking Active   isosorbide mononitrate (IMDUR) 30 MG 24 hr tablet [id number] Yes Take 1 tablet (30 mg total) by mouth daily. Stark Jock Jude, MD Taking Active   simvastatin (ZOCOR) 20 MG tablet [id number] No Take 1 tablet (20 mg total) by  mouth daily.  Patient not taking:  Reported on 02/12/2019   Kathrine Haddock, NP Not Taking Active Mother         Assessment:   Goals Addressed            This Visit's Progress     Patient Stated   . "I can't afford my medications" (pt-stated)       Current Barriers:  . Non Adherence to prescribed medication regimen due to financial barriers - patient reports he is only taking furosemide, carvedilol, hydralazine, and isosorbide mononitrate. He notes that he could not afford to pick up carbamazepine or amlodipine at [facility], but may be able to afford next week  o HFpEF, most recent EF 60-65% o Hx seizures, has not been adherent to carbamazepine . Notes that he does NOT have a scale or a BP cuff at home  Pharmacist Clinical Goal(s):  Marland Kitchen Over the next 30 days, patient will work with PharmD, PCP, and cardiology to address needs related to medication access and adherence;   Interventions: . Contacted [facility] pharmacist; asked to outreach patient to begin financial screening to determine if patient will qualify to receive medications from the clinic o Will collaborate with PCP to send all [facility] to [facility] . Comprehensive medication review performed. Though patient is NOT taking carbamazepine at this time, the follow drug drug interactions should be anticipated: o Increased clearance of carvedilol, therefore less benefit. Will communicate this with PCP/CHF team when patient restarts carbamazepine o Increased clearance, and potentially NO benefit, from amlodipine. Will communicate this with PCP/CHF team o Increased clearance of isosorbide, therefore less benefit. Will communicate this with PCP/CHF team  Collaborate with CCM RN and SW to determine ways for patient to obtain a scale and/or BP cuff. Above DDIs will require BP monitoring when carbamazepine is initiated to determine full impact  Patient Self Care Activities:  . Patient will continue to take medications as prescribed as he receives them  Initial goal documentation     . "I'm trying to quit smoking" (pt-stated)       Current Barriers:  . Tobacco abuse: patient reports he previously smoked 1 ppd, now reports smoking 2 small cigars (black and milds) daily. Endorses motivation to quit smoking for his health. Notes that he did not have cravings to smoke when he was admitted and was wearing a nicotine patch, but has never used NRT or any other pharmacotherapy for tobacco cessation  Pharmacist Clinical Goal(s):  Marland Kitchen Over the next 30 days, patient will  work with community resources to address needs related to tobacco cessation  Interventions: . Advised patient to contact [place]; provided with phone number. Provided motivational interviewing to encourage patient to contact this resource for support and encouragement, and access to NRT therapy  Patient Self Care Activities:  . Patient will contact [place] [facility]uit [facility] in the next 2 weeks  Initial goal documentation        Plan: - PharmD will collaborate with CCM RN and SW to connect patient with resources and determine how to best obtain a scale and BP cuff - PharmD will outreach PCP and Darylene Price, NP (cardiology) to alert to DDIs with carbamazepine and amlodipine/carvedilol.  - PharmD will outreach patient next week to check in   Catie Darnelle Maffucci, PharmD Clinical Pharmacist [place] [phone]

## 2019-02-12 NOTE — Patient Instructions (Addendum)
Visit Information  Goals Addressed            This Visit's Progress     Patient Stated   . "I can't afford my medications" (pt-stated)       Current Barriers:  . Non Adherence to prescribed medication regimen due to financial barriers - patient reports he is only taking furosemide, carvedilol, hydralazine, and isosorbide mononitrate. He notes that he could not afford to pick up carbamazepine or amlodipine at [facility], but may be able to afford next week o HFpEF, most recent EF 60-65% o Hx seizures, has not been adherent to carbamazepine . Notes that he does NOT have a scale or a BP cuff at home  Pharmacist Clinical Goal(s):  Marland Kitchen Over the next 30 days, patient will work with PharmD, PCP, and cardiology to address needs related to medication access and adherence;   Interventions: . Contacted [facility] pharmacist; asked to outreach patient to begin financial screening to determine if patient will qualify to receive medications from the clinic o Will collaborate with PCP to send all [facility] to [facility] . Comprehensive medication review performed. Though patient is NOT taking carbamazepine at this time, the follow drug drug interactions should be anticipated: o Increased clearance of carvedilol, therefore less benefit. Will communicate this with PCP/CHF team when patient restarts carbamazepine o Increased clearance, and potentially NO benefit, from amlodipine. Will communicate this with PCP/CHF team o Increased clearance of isosorbide, therefore less benefit. Will communicate this with PCP/CHF team  Collaborate with CCM RN and SW to determine ways for patient to obtain a scale and/or BP cuff. Above DDIs will require BP monitoring when carbamazepine is initiated to determine full impact  Patient Self Care Activities:  . Patient will continue to take medications as prescribed as he receives them  Initial goal documentation     . "I'm trying to  quit smoking" (pt-stated)       Current Barriers:  . Tobacco abuse: patient reports he previously smoked 1 ppd, now reports smoking 2 small cigars (black and milds) daily. Endorses motivation to quit smoking for his health. Notes that he did not have cravings to smoke when he was admitted and was wearing a nicotine patch, but has never used NRT or any other pharmacotherapy for tobacco cessation  Pharmacist Clinical Goal(s):  Marland Kitchen Over the next 30 days, patient will work with community resources to address needs related to tobacco cessation  Interventions: . Advised patient to contact [place]; provided with phone number. Provided motivational interviewing to encourage patient to contact this resource for support and encouragement, and access to NRT therapy  Patient Self Care Activities:  . Patient will contact [place] [facility]uit [facility] in the next 2 weeks  Initial goal documentation        Jose [name] was given information about Chronic Care Management services today including:  1. CCM service includes personalized support from designated clinical staff supervised by his physician, including individualized plan of care and coordination with other care providers 2. 24/7 contact phone numbers for assistance for urgent and routine care needs. 3. Service will only be billed when office clinical staff spend 20 minutes or more in a month to coordinate care. 4. Only one practitioner may furnish and bill the service in a calendar month. 5. The patient may stop CCM services at any time (effective at the end of the month) by phone call to the office staff. 6. The patient will be responsible for cost sharing (  co-pay) of up to 20% of the service fee (after annual deductible is met).  Patient agreed to services and verbal consent obtained.   The patient verbalized understanding of instructions provided today and declined a print copy of patient instruction materials.   Plan: - PharmD will collaborate with CCM RN  and SW to connect patient with resources and determine how to best obtain a scale and BP cuff - PharmD will outreach PCP and Darylene Price, NP (cardiology) to alert to DDIs with carbamazepine and amlodipine/carvedilol.  - PharmD will outreach patient next week to check in   Catie Darnelle Maffucci, PharmD Clinical Pharmacist [place] [phone]

## 2019-02-13 ENCOUNTER — Telehealth: Payer: Self-pay | Admitting: Family Medicine

## 2019-02-13 MED ORDER — FUROSEMIDE 20 MG PO TABS: 20.0000 mg | ORAL_TABLET | Freq: Every day | ORAL | 1 refills | Status: DC

## 2019-02-13 MED ORDER — AMLODIPINE BESYLATE 5 MG PO TABS: 5.0000 mg | ORAL_TABLET | Freq: Every day | ORAL | 1 refills | Status: DC

## 2019-02-13 MED ORDER — CARBAMAZEPINE ER 200 MG PO TB12: 200.0000 mg | ORAL_TABLET | Freq: Two times a day (BID) | ORAL | 1 refills | Status: DC

## 2019-02-13 MED ORDER — HYDRALAZINE HCL 25 MG PO TABS: 25.0000 mg | ORAL_TABLET | Freq: Three times a day (TID) | ORAL | 1 refills | Status: DC

## 2019-02-13 MED ORDER — ISOSORBIDE MONONITRATE ER 30 MG PO TB24: 30.0000 mg | ORAL_TABLET | Freq: Every day | ORAL | 1 refills | Status: DC

## 2019-02-13 MED ORDER — CARVEDILOL 25 MG PO TABS: 25.0000 mg | ORAL_TABLET | Freq: Two times a day (BID) | ORAL | 1 refills | Status: DC

## 2019-02-13 MED ORDER — SIMVASTATIN 20 MG PO TABS: 20.0000 mg | ORAL_TABLET | Freq: Every day | ORAL | 1 refills | Status: DC

## 2019-02-13 NOTE — Telephone Encounter (Signed)
-----   Message from De Hollingshead, [facility] sent at 02/13/2019  2:34 PM EDT ----- Regarding: RE: Carbamazepine DDI Sounds good! Could you e-scribe his prescriptions to [facility]? I've added it to his preferred pharmacies.   Like I told Jackelyn Hoehn, we'll work on the BP cuff/scale for monitoring.   Catie ----- Message ----- From: Guadalupe Maple, MD Sent: 02/13/2019  11:08 AM EDT To: De Hollingshead, RPH Subject: RE: Carbamazepine DDI                          Lets just monitor for now, and thanks for your assistance. ----- Message ----- From: De Hollingshead, [facility] Sent: 02/12/2019   2:33 PM EDT To: Guadalupe Maple, MD, Alisa Graff, FNP Subject: Carbamazepine DDI                              Dr. Jeananne Rama and Ms Jackelyn Hoehn,   In reviewing medications with this patient today, he noted that he could only afford to pick up (therefore is only taking) isosorbide, carvedilol, furosemide, and hydralazine. I'm working on connecting him to [facility] at [facility] to see if he can receive medications for free through them, however, I wanted you both to be aware of anticipated drug drug interactions when he is able to afford, therefore restarts, carbamazepine:  - May increase clearance of carvedilol and isosorbide; may require dose adjustments moving forward (Of note, I'll be working with the [facility] team to see if we can get the patient a home BP cuff) - Will likely increase clearance of amlodipine, and he may not receive any antihypertensive benefit from amlodipine. Would you like him to restart amlodipine at all at this time, given the concurrent HF, or restart and monitor BP/LEE?  Thank you,   Catie Darnelle Maffucci, PharmD Clinical Pharmacist [place] [phone]

## 2019-02-14 ENCOUNTER — Telehealth: Payer: Self-pay

## 2019-02-18 ENCOUNTER — Other Ambulatory Visit: Payer: Self-pay

## 2019-02-18 ENCOUNTER — Ambulatory Visit: Payer: Self-pay | Admitting: Licensed Clinical Social Worker

## 2019-02-18 DIAGNOSIS — F1010 Alcohol abuse, uncomplicated: Secondary | ICD-10-CM

## 2019-02-18 NOTE — Patient Instructions (Signed)
Licensed Clinical Social Worker Visit Information  Patient agreed to services and verbal consent obtained.   The patient verbalized understanding of instructions provided today and declined a print copy of patient instruction materials.   Follow up plan: Client will contact LCSW if he changes his mind in regards to CCM social work support or if any social work needs arise that he wants assistance/help with  Eula Fried, [place], MSW, [email or web address] Phone: [phone]

## 2019-02-18 NOTE — Chronic Care Management (AMB) (Signed)
  Chronic Care Management    Clinical Social Work General Note  02/18/2019 Name: Jose [name]. MRN: [id number] DOB: 1971/09/18  Jose Dakins. is a 48 y.o. year old male who is a primary care patient of Crissman, Jeannette How, MD. The CCM was consulted to assist the patient with Community Resources-related to [place] and mental health support. Patient is already active with CCM program. LCSW spoke with patient and received HIPPA verifications successfully. Patient reports that he does not have any goals that he wishes to actively work on in terms of social work at this time stating that he is managing his drinking habits and mental health needs appropriately. Patient denies needing information/resources on available mental health and AA resources that are still providing services during [place]. LCSW will update CCM Pharmacist who is currently involved in patent's care.   Review of patient status, including review of consultants reports, relevant laboratory and other test results, and collaboration with appropriate care team members and the patient's provider was performed as part of comprehensive patient evaluation and provision of chronic care management services.    Follow Up Plan: Client will contact LCSW if any social work needs arise. Direct contact number provided to family.      Eula Fried, BSW, MSW, [facility] Care Management [email or web address] Phone: [phone]

## 2019-02-19 ENCOUNTER — Ambulatory Visit: Payer: Self-pay | Admitting: Pharmacist

## 2019-02-19 DIAGNOSIS — I10 Essential (primary) hypertension: Secondary | ICD-10-CM

## 2019-02-19 DIAGNOSIS — I5031 Acute diastolic (congestive) heart failure: Secondary | ICD-10-CM

## 2019-02-19 DIAGNOSIS — R569 Unspecified convulsions: Secondary | ICD-10-CM

## 2019-02-19 NOTE — Chronic Care Management (AMB) (Signed)
  Chronic Care Management   Follow Up Note   02/19/2019 Name: Jose [name]. MRN: [id number] DOB: 1971-04-23  Referred by: Jose Maple, MD Reason for referral : No chief complaint on file.   Jose [name]. is a 48 y.o. year old male who is a primary care patient of Crissman, Jeannette How, MD. The CCM team was consulted for assistance with chronic disease management and care coordination needs.    Contacted patient to see if he had been connected with [facility] yet.   Review of patient status, including review of consultants reports, relevant laboratory and other test results, and collaboration with appropriate care team members and the patient's provider was performed as part of comprehensive patient evaluation and provision of chronic care management services.    Goals Addressed            This Visit's Progress     Patient Stated   . "I can't afford my medications" (pt-stated)       Current Barriers:  . Non Adherence to prescribed medication regimen due to financial barriers . [facility] last week. Patient notes that he had a missed call from [facility], but he has not returned their call yet to start the eligibility screening process  Pharmacist Clinical Goal(s):  Marland Kitchen Over the next 30 days, patient will work with PharmD, PCP, and cardiology to address needs related to medication access and adherence;   Interventions:   Encouraged patient to return the phone call to [facility]. Explained that they typically can provided a month supply for free while working through eligibility with the patient. Provided patient with the contact information. He noted that he would call them back as soon as he completed the call with me  Patient Self Care Activities:  . Patient will continue to take medications as prescribed as he receives them  Please see past updates related to this goal by clicking on the "Past Updates" button in the selected goal           Plan: - CCM Pharmacist will outreach patient within 1 week to follow up  Dorris, PharmD Clinical Pharmacist [place] [phone]

## 2019-02-19 NOTE — Patient Instructions (Signed)
Visit Information  Goals Addressed            This Visit's Progress     Patient Stated   . "I can't afford my medications" (pt-stated)       Current Barriers:  . Non Adherence to prescribed medication regimen due to financial barriers . [facility] last week. Patient notes that he had a missed call from [facility], but he has not returned their call yet to start the eligibility screening process  Pharmacist Clinical Goal(s):  Marland Kitchen Over the next 30 days, patient will work with PharmD, PCP, and cardiology to address needs related to medication access and adherence;   Interventions:   Encouraged patient to return the phone call to [facility]. Explained that they typically can provided a month supply for free while working through eligibility with the patient. Provided patient with the contact information. He noted that he would call them back as soon as he completed the call with me  Patient Self Care Activities:  . Patient will continue to take medications as prescribed as he receives them  Please see past updates related to this goal by clicking on the "Past Updates" button in the selected goal         The patient verbalized understanding of instructions provided today and declined a print copy of patient instruction materials.   Plan: - CCM Pharmacist will outreach patient within 1 week to follow up  [place], PharmD Clinical Pharmacist [place] [phone]

## 2019-02-20 ENCOUNTER — Ambulatory Visit: Payer: Self-pay | Admitting: *Deleted

## 2019-02-20 DIAGNOSIS — I10 Essential (primary) hypertension: Secondary | ICD-10-CM

## 2019-02-20 DIAGNOSIS — I5031 Acute diastolic (congestive) heart failure: Secondary | ICD-10-CM

## 2019-02-20 NOTE — Chronic Care Management (AMB) (Signed)
  Chronic Care Management   Follow Up Note   02/20/2019 Name: Jose [name]. MRN: [id number] DOB: 01-09-1971  Referred by: Guadalupe Maple, MD Reason for referral : Chronic Care Management (f/u CHF)   Jose [name]. is a 48 y.o. year old male who is a primary care patient of Crissman, Jeannette How, MD. The CCM team was consulted for assistance with chronic disease management and care coordination needs.    Patient not home at this time, was able to speak with the patient's mother. Patient's mother confirmed patient did not have a home scale. She stated she does the grocery shopping and cooking and would like more information of low salt diet.   Review of patient status, including review of consultants reports, relevant laboratory and other test results, and collaboration with appropriate care team members and the patient's provider was performed as part of comprehensive patient evaluation and provision of chronic care management services.    Goals Addressed            This Visit's Progress   . heart failure (pt-stated)       Current Barriers:  . Patient does not have readable scale  Case Manager Clinical Goal(s):  Marland Kitchen Over the next 60 days, patient will weigh daily and record (notifying MD of 3 lb weight gain over night or 5 lb in a week)  Interventions: (Spoke with mother/DPR, she does the cooking and grocery shopping) . Provided verbal education on low sodium diet . Provided written education on low sodium diet-plan to mail . Assessed need for readable accurate scales in home . Advised patient's mother of the need for the patient to weigh each morning after emptying bladder . Discussed importance of daily weight and advised patient's mother to encourage the patient to weigh and record daily  Patient Self Care Activities:  . Does not weigh daily and record related to no home scale at this time  Initial goal documentation        The CM team will reach out to the patient again over  the next 7 days.    Merlene Morse  RN, BSN Nurse Case Editor, commissioning Family Practice/[facility] Care Management  [phone]) Business Mobile

## 2019-02-22 NOTE — Patient Instructions (Signed)
Thank you allowing the Chronic Care Management Team to be a part of your care! It was a pleasure speaking with you today!   CCM (Chronic Care Management) Team   Janci Minor RN, BSN Nurse Care Coordinator  [phone]  Catie [facility] PharmD  Clinical Pharmacist  [phone]  Eula Fried LCSW Clinical Social Worker [phone]  Goals Addressed            This Visit's Progress   . heart failure (pt-stated)       Current Barriers:  . Patient does not have readable scale  Case Manager Clinical Goal(s):  Marland Kitchen Over the next 60 days, patient will weigh daily and record (notifying MD of 3 lb weight gain over night or 5 lb in a week)  Interventions: (Spoke with mother/DPR, she does the cooking and grocery shopping) . Provided verbal education on low sodium diet . Provided written education on low sodium diet-plan to mail . Assessed need for readable accurate scales in home . Advised patient's mother of the need for the patient to weigh each morning after emptying bladder . Discussed importance of daily weight and advised patient's mother to encourage the patient to weigh and record daily  Patient Self Care Activities:  . Does not weigh daily and record related to no home scale at this time  Initial goal documentation       The patient verbalized understanding of instructions provided today and declined a print copy of patient instruction materials.   The CM team will reach out to the patient again over the next 7 days.

## 2019-02-28 ENCOUNTER — Telehealth: Payer: Self-pay

## 2019-03-01 ENCOUNTER — Ambulatory Visit: Payer: Self-pay | Admitting: *Deleted

## 2019-03-01 DIAGNOSIS — I10 Essential (primary) hypertension: Secondary | ICD-10-CM

## 2019-03-01 DIAGNOSIS — I5031 Acute diastolic (congestive) heart failure: Secondary | ICD-10-CM

## 2019-03-01 NOTE — Chronic Care Management (AMB) (Signed)
  Chronic Care Management   Follow Up Note   03/01/2019 Name: Jose [name]. MRN: [id number] DOB: 1971/07/07  Referred by: Jose Maple, MD Reason for referral : Chronic Care Management (HF)   Jose Dakins. is a 48 y.o. year old male who is a primary care patient of Jose [name], Jose How, MD. The CCM team was consulted for assistance with chronic disease management and care coordination needs.  Spoke with Mr. Jose [name] by phone today. He has obtained a scale and is weighing every day but not recording. Reported weight today 224lbs. Patient stated he has obtained a low salt cook book and his mother who does most of the cooking has been preparing healthier options.Patient stated he is being aware of increased swelling and he hasn't seen any. Denies SOB, being able to lie flat or decreased appetite. Voiced understanding of the need to continue weighing daily and recording. Reminded him of HF-Clinic Virtual appointment on 5/11.    Review of patient status, including review of consultants reports, relevant laboratory and other test results, and collaboration with appropriate care team members and the patient's provider was performed as part of comprehensive patient evaluation and provision of chronic care management services.    Goals Addressed            This Visit's Progress   . heart failure (pt-stated)       Current Barriers:  Marland Kitchen Knowledge deficit related to basic heart failure pathophysiology and self care management . Patient does not have readable scale- Patient now has a scale . Patient does not have a b/p cuff . Financial strain  Case Manager Clinical Goal(s):  Marland Kitchen Over the next 60 days, patient will verbalize understanding of Heart Failure Action Plan and when to call doctor . Over the next 90 days, patient will take all Heart Failure mediations as prescribed . Over the next 60 days, patient will weigh daily and record (notifying MD of 3 lb weight gain over night or 5 lb in a week)   Interventions: (Spoke with mother/DPR, she does the cooking and grocery shopping) . Provided verbal education on low sodium diet . Provided written education on low sodium diet-plan to mail-Patients mother has received low salt cook book and feels confident she can make more heart healthy meals . Assessed need for readable accurate scales in home- Since last conversation patient has obtained a scale and is weighing each morning. . Advised patient's mother of the need for the patient to weigh each morning after emptying bladder . Discussed importance of daily weight and advised patient's mother to encourage the patient to weigh and record daily-Mailing patient [name] Calendar with weight log and b/p log . Discussed the importance of checking b/p daily and recording . Encouraged patient to obtain b/p monitor from [name]  Patient Self Care Activities:  . Patient NOT recording weights daily . Patient does not check b/p at home . Patient is hoping to return to work soon  Please see past updates related to this goal by clicking on the "Past Updates" button in the selected goal         The CM team will reach out to the patient again over the next 14 days.   Jose [name] Minor RN, BSN Nurse Case Editor, commissioning Family Practice/[facility] Care Management  [phone]) Business Mobile

## 2019-03-01 NOTE — Patient Instructions (Signed)
Thank you allowing the Chronic Care Management Team to be a part of your care! It was a pleasure speaking with you today!  1. Weigh Daily and record 2. Obtain a b/p cuff from [employer] 3. Report weight gain of 3 pounds in 1 day or 5 pounds in 1 week   CCM (Chronic Care Management) Team   Rutherford Limerick RN, BSN Nurse Care Coordinator  [phone]  Catie [facility] PharmD  Clinical Pharmacist  [phone]  Eula Fried LCSW Clinical Social Worker [phone]  Goals Addressed            This Visit's Progress   . heart failure (pt-stated)       Current Barriers:  Marland Kitchen Knowledge deficit related to basic heart failure pathophysiology and self care management . Patient does not have readable scale- Patient now has a scale . Patient does not have a b/p cuff . Financial strain  Case Manager Clinical Goal(s):  Marland Kitchen Over the next 60 days, patient will verbalize understanding of Heart Failure Action Plan and when to call doctor . Over the next 90 days, patient will take all Heart Failure mediations as prescribed . Over the next 60 days, patient will weigh daily and record (notifying MD of 3 lb weight gain over night or 5 lb in a week)  Interventions: (Spoke with mother/DPR, she does the cooking and grocery shopping) . Provided verbal education on low sodium diet . Provided written education on low sodium diet-plan to mail-Patients mother has received low salt cook book and feels confident she can make more heart healthy meals . Assessed need for readable accurate scales in home- Since last conversation patient has obtained a scale and is weighing each morning. . Advised patient's mother of the need for the patient to weigh each morning after emptying bladder . Discussed importance of daily weight and advised patient's mother to encourage the patient to weigh and record daily-Mailing patient [facility] Calendar with weight log and b/p log . Discussed the importance of checking b/p daily and  recording . Encouraged patient to obtain b/p monitor from [facility]  Patient Self Care Activities:  . Patient NOT recording weights daily . Patient does not check b/p at home . Patient is hoping to return to work soon  Please see past updates related to this goal by clicking on the "Past Updates" button in the selected goal        The patient verbalized understanding of instructions provided today and declined a print copy of patient instruction materials.   The CM team will reach out to the patient again over the next 14 days.

## 2019-03-14 ENCOUNTER — Telehealth: Payer: Self-pay

## 2019-03-18 ENCOUNTER — Telehealth: Payer: Self-pay | Admitting: Family

## 2019-03-18 ENCOUNTER — Ambulatory Visit: Payer: Self-pay | Attending: Family | Admitting: Family

## 2019-03-18 ENCOUNTER — Other Ambulatory Visit: Payer: Self-pay

## 2019-03-18 ENCOUNTER — Telehealth: Payer: Self-pay

## 2019-03-18 NOTE — Telephone Encounter (Signed)
Patient did not answer his phone for his telemedicine HF Clinic visit on 03/18/2019. Message left on his voicemail for him to call back so the visit can be completed or to be rescheduled.

## 2019-03-18 NOTE — Telephone Encounter (Signed)
   TELEPHONE CALL NOTE  This patient has been deemed a candidate for follow-up tele-health visit to limit community exposure during the Covid-19 pandemic. I spoke with the patient via phone to discuss instructions. The patient was advised to review the section on consent for treatment as well. The patient will receive a phone call 2-3 days prior to their E-Visit at which time consent will be verbally confirmed. A Virtual Office Visit appointment type has been scheduled for 03/18/2019 with Darylene Price FNP.  ALLRED, AMANDA L, CMA 03/18/2019 8:21 AM

## 2019-03-18 NOTE — Telephone Encounter (Signed)
TELEPHONE CALL NOTE  Jose [name]. has been deemed a candidate for a follow-up tele-health visit to limit community exposure during the Covid-19 pandemic. I spoke with the patient via phone to ensure availability of phone/video source, confirm preferred email & phone number, discuss instructions and expectations, and review consent.   I reminded Jose [name]. to be prepared with any vital sign and/or heart rhythm information that could potentially be obtained via home monitoring, at the time of his visit.  Finally, I reminded Jose [name]. to expect an e-mail containing a link for their video-based visit approximately 15 minutes before his visit, or alternatively, a phone call at the time of his visit if his visit is planned to be a phone encounter.  Did the patient verbally consent to treatment as below? YES  ALLRED, AMANDA L, CMA 03/18/2019 8:21 AM  CONSENT FOR TELE-HEALTH VISIT - PLEASE REVIEW  I hereby voluntarily request, consent and authorize [facility] and its employed or contracted physicians, physician assistants, nurse practitioners or other licensed health care professionals (the Practitioner), to provide me with telemedicine health care services (the "Services") as deemed necessary by the treating Practitioner. I acknowledge and consent to receive the Services by the Practitioner via telemedicine. I understand that the telemedicine visit will involve communicating with the Practitioner through telephonic communication technology and the disclosure of certain medical information by electronic transmission. I acknowledge that I have been given the opportunity to request an in-person assessment or other available alternative prior to the telemedicine visit and am voluntarily participating in the telemedicine visit.  I understand that I have the right to withhold or withdraw my consent to the use of telemedicine in the course of my care at any time, without affecting my  right to future care or treatment, and that the Practitioner or I may terminate the telemedicine visit at any time. I understand that I have the right to inspect all information obtained and/or recorded in the course of the telemedicine visit and may receive copies of available information for a reasonable fee.  I understand that some of the potential risks of receiving the Services via telemedicine include:  Marland Kitchen Delay or interruption in medical evaluation due to technological equipment failure or disruption; . Information transmitted may not be sufficient (e.g. poor resolution of images) to allow for appropriate medical decision making by the Practitioner; and/or  . In rare instances, security protocols could fail, causing a breach of personal health information.  Furthermore, I acknowledge that it is my responsibility to provide information about my medical history, conditions and care that is complete and accurate to the best of my ability. I acknowledge that Practitioner's advice, recommendations, and/or decision may be based on factors not within their control, such as incomplete or inaccurate data provided by me or lack of visual representation. I understand that the practice of medicine is not an exact science and that Practitioner makes no warranties or guarantees regarding treatment outcomes. I acknowledge that I will receive a copy of this consent concurrently upon execution via email to the email address I last provided but may also request a printed copy by calling the office of [facility].    I understand that my insurance may be billed for this visit.   I have read or had this consent read to me. . I understand the contents of this consent, which adequately explains the benefits and risks of the Services being provided via  telemedicine.  . I have been provided ample opportunity to ask questions regarding this consent and the Services and have had my questions answered to my  satisfaction. . I give my informed consent for the services to be provided through the use of telemedicine in my medical care  By participating in this telemedicine visit I agree to the above.

## 2019-03-21 ENCOUNTER — Telehealth: Payer: Self-pay

## 2019-03-25 ENCOUNTER — Ambulatory Visit: Payer: Self-pay | Attending: Family | Admitting: Family

## 2019-03-25 ENCOUNTER — Encounter: Payer: Self-pay | Admitting: Family

## 2019-03-25 ENCOUNTER — Telehealth: Payer: Self-pay

## 2019-03-25 ENCOUNTER — Other Ambulatory Visit: Payer: Self-pay

## 2019-03-25 VITALS — Wt 230.0 lb

## 2019-03-25 DIAGNOSIS — F17200 Nicotine dependence, unspecified, uncomplicated: Secondary | ICD-10-CM

## 2019-03-25 DIAGNOSIS — I5032 Chronic diastolic (congestive) heart failure: Secondary | ICD-10-CM

## 2019-03-25 DIAGNOSIS — I10 Essential (primary) hypertension: Secondary | ICD-10-CM

## 2019-03-25 MED ORDER — FUROSEMIDE 20 MG PO TABS: 20.0000 mg | ORAL_TABLET | Freq: Every day | ORAL | 3 refills | Status: DC

## 2019-03-25 MED ORDER — ISOSORBIDE MONONITRATE ER 30 MG PO TB24: 30.0000 mg | ORAL_TABLET | Freq: Every day | ORAL | 3 refills | Status: DC

## 2019-03-25 MED ORDER — AMLODIPINE BESYLATE 5 MG PO TABS: 5.0000 mg | ORAL_TABLET | Freq: Every day | ORAL | 3 refills | Status: DC

## 2019-03-25 MED ORDER — CARVEDILOL 25 MG PO TABS: 25.0000 mg | ORAL_TABLET | Freq: Two times a day (BID) | ORAL | 3 refills | Status: DC

## 2019-03-25 MED ORDER — HYDRALAZINE HCL 25 MG PO TABS: 25.0000 mg | ORAL_TABLET | Freq: Three times a day (TID) | ORAL | 3 refills | Status: DC

## 2019-03-25 MED ORDER — SIMVASTATIN 20 MG PO TABS: 20.0000 mg | ORAL_TABLET | Freq: Every day | ORAL | 3 refills | Status: DC

## 2019-03-25 NOTE — Patient Instructions (Signed)
Continue weighing daily and call for an overnight weight gain of > 2 pounds or a weekly weight gain of >5 pounds. 

## 2019-03-25 NOTE — Telephone Encounter (Signed)
TELEPHONE CALL NOTE  Jose [name]. has been deemed a candidate for a follow-up tele-health visit to limit community exposure during the Covid-19 pandemic. I spoke with the patient via phone to ensure availability of phone/video source, confirm preferred email & phone number, discuss instructions and expectations, and review consent.   I reminded Jose Velazco. to be prepared with any vital sign and/or heart rhythm information that could potentially be obtained via home monitoring, at the time of his visit.  Finally, I reminded Jose Domanski. to expect an e-mail containing a link for their video-based visit approximately 15 minutes before his visit, or alternatively, a phone call at the time of his visit if his visit is planned to be a phone encounter.  Did the patient verbally consent to treatment as below? YES  Jose [name], Jose [name], CMA 03/25/2019 11:08 AM  CONSENT FOR TELE-HEALTH VISIT - PLEASE REVIEW  I hereby voluntarily request, consent and authorize [facility] and its employed or contracted physicians, physician assistants, nurse practitioners or other licensed health care professionals (the Practitioner), to provide me with telemedicine health care services (the "Services") as deemed necessary by the treating Practitioner. I acknowledge and consent to receive the Services by the Practitioner via telemedicine. I understand that the telemedicine visit will involve communicating with the Practitioner through telephonic communication technology and the disclosure of certain medical information by electronic transmission. I acknowledge that I have been given the opportunity to request an in-person assessment or other available alternative prior to the telemedicine visit and am voluntarily participating in the telemedicine visit.  I understand that I have the right to withhold or withdraw my consent to the use of telemedicine in the course of my care at any time, without affecting my  right to future care or treatment, and that the Practitioner or I may terminate the telemedicine visit at any time. I understand that I have the right to inspect all information obtained and/or recorded in the course of the telemedicine visit and may receive copies of available information for a reasonable fee.  I understand that some of the potential risks of receiving the Services via telemedicine include:  Marland Kitchen Delay or interruption in medical evaluation due to technological equipment failure or disruption; . Information transmitted may not be sufficient (e.g. poor resolution of images) to allow for appropriate medical decision making by the Practitioner; and/or  . In rare instances, security protocols could fail, causing a breach of personal health information.  Furthermore, I acknowledge that it is my responsibility to provide information about my medical history, conditions and care that is complete and accurate to the best of my ability. I acknowledge that Practitioner's advice, recommendations, and/or decision may be based on factors not within their control, such as incomplete or inaccurate data provided by me or lack of visual representation. I understand that the practice of medicine is not an exact science and that Practitioner makes no warranties or guarantees regarding treatment outcomes. I acknowledge that I will receive a copy of this consent concurrently upon execution via email to the email address I last provided but may also request a printed copy by calling the office of [facility].    I understand that my insurance may be billed for this visit.   I have read or had this consent read to me. . I understand the contents of this consent, which adequately explains the benefits and risks of the Services being provided via  telemedicine.  . I have been provided ample opportunity to ask questions regarding this consent and the Services and have had my questions answered to my  satisfaction. . I give my informed consent for the services to be provided through the use of telemedicine in my medical care  By participating in this telemedicine visit I agree to the above.

## 2019-03-25 NOTE — Progress Notes (Signed)
Virtual Visit via Telephone Note    Evaluation Performed:  Follow-up visit  This visit type was conducted due to national recommendations for restrictions regarding the COVID-19 Pandemic (e.g. social distancing).  This format is felt to be most appropriate for this patient at this time.  All issues noted in this document were discussed and addressed.  No physical exam was performed (except for noted visual exam findings with Video Visits).  Please refer to the patient's chart (MyChart message for video visits and phone note for telephone visits) for the patient's consent to telehealth for Jose [name]  Date:  03/25/2019   ID:  Jose Dakins., DOB Mar 21, 1971, MRN [id number]  Patient Location:  His work location  Provider location:   [name], [place] [address]  PCP:  Jose Maple, MD  Cardiologist:  No primary care provider on file.  Electrophysiologist:  None   Chief Complaint:  Shortness of breath  History of Present Illness:    Jose Al. is [name] 48 y.o. male who presents via audio/video conferencing for [name] telehealth visit today.  Patient verified DOB and address.  The patient does not have symptoms concerning for COVID-19 infection (fever, chills, cough, or new SHORTNESS OF BREATH).   Patient reports minimal shortness of breath upon moderate exertion. He describes this as chronic in nature having been present for several years. He has associated fatigue along with this. He denies any dizziness, swelling in his legs/ abdomen, palpitations, chest pain, difficulty sleeping or weight gain. Continues to work full-time.   Prior CV studies:   The following studies were reviewed today:  Echo report from 01/29/2019 reviewed and showed an EF of 60-65%.  Past Medical History:  Diagnosis Date  . Alcohol abuse   . Cervicalgia   . Hyperlipidemia   . Hypertension   . Seizures ([place])    Past Surgical History:  Procedure  Laterality Date  . COLONOSCOPY WITH PROPOFOL N/[name] 01/16/2018   Procedure: COLONOSCOPY WITH PROPOFOL;  Surgeon: Jose Landsman, MD;  Location: [facility] ENDOSCOPY;  Service: Gastroenterology;  Laterality: N/[name];  . ESOPHAGOGASTRODUODENOSCOPY (EGD) WITH PROPOFOL N/[name] 01/16/2018   Procedure: ESOPHAGOGASTRODUODENOSCOPY (EGD) WITH PROPOFOL;  Surgeon: Jose Landsman, MD;  Location: [facility] ENDOSCOPY;  Service: Gastroenterology;  Laterality: N/[name];     Prior to Admission medications   Medication Sig Start Date End Date Taking? Authorizing Provider  amLODipine (NORVASC) 5 MG tablet Take 1 tablet (5 mg total) by mouth daily. 03/25/19  Yes Jose [name], Jose [name] A, FNP  carbamazepine (TEGRETOL-XR) 200 MG 12 hr tablet Take 1 tablet (200 mg total) by mouth 2 (two) times daily. 02/13/19  Yes Jose Maple, MD  carvedilol (COREG) 25 MG tablet Take 1 tablet (25 mg total) by mouth 2 (two) times daily with [name] meal. 03/25/19  Yes Jose [name], Tina A, FNP  furosemide (LASIX) 20 MG tablet Take 1 tablet (20 mg total) by mouth daily. 03/25/19  Yes Jose [name] A, FNP  hydrALAZINE (APRESOLINE) 25 MG tablet Take 1 tablet (25 mg total) by mouth 3 (three) times daily. 03/25/19  Yes Jose [name] A, FNP  isosorbide mononitrate (IMDUR) 30 MG 24 hr tablet Take 1 tablet (30 mg total) by mouth daily. 03/25/19  Yes Jose [name], Jose [name] A, FNP  simvastatin (ZOCOR) 20 MG tablet Take 1 tablet (20 mg total) by mouth daily. 03/25/19  Yes Jose Graff, FNP      Allergies:   Patient has no  known allergies.   Social History   Tobacco Use  . Smoking status: Current Every Day Smoker    Packs/day: 0.50  . Smokeless tobacco: Former Network engineer Use Topics  . Alcohol use: Yes    Alcohol/week: 28.0 standard drinks    Types: 7 Cans of beer, 21 Shots of liquor per week    Comment: half pint liquor per day  . Drug use: No    Types: Marijuana     Family Hx: The patient's family history includes Colon cancer in his mother; Diabetes in his mother;  Hypertension in his father; Multiple sclerosis in his father.  ROS:   Please see the history of present illness.     All other systems reviewed and are negative.   Labs/Other Tests and Data Reviewed:    Recent Labs: 01/30/2019: Magnesium 1.4 02/08/2019: BUN 19; Creatinine, Ser 0.83; Hemoglobin 13.0; Platelets 238; Potassium 4.8; Sodium 139   Recent Lipid Panel Lab Results  Component Value Date/Time   CHOL 225 (H) 01/30/2019 04:40 AM   CHOL 211 (H) 06/28/2016 09:44 AM   CHOL 233 (H) 07/07/2015 03:53 PM   TRIG 193 (H) 01/30/2019 04:40 AM   TRIG 361 (H) 07/07/2015 03:53 PM   HDL 40 (L) 01/30/2019 04:40 AM   HDL 47 06/28/2016 09:44 AM   CHOLHDL 5.6 01/30/2019 04:40 AM   LDLCALC 146 (H) 01/30/2019 04:40 AM   LDLCALC 139 (H) 06/28/2016 09:44 AM    Wt Readings from Last 3 Encounters:  03/25/19 230 lb (104.3 kg)  02/07/19 227 lb (103 kg)  01/30/19 221 lb 11.2 oz (100.6 kg)     Exam:    Vital Signs:  Wt 230 lb (104.3 kg) Comment: self-reported  BMI 32.08 kg/m    Well nourished, well developed male in no  acute distress.   ASSESSMENT & PLAN:    1. Chronic heart failure with preserved ejection fraction- - NYHA class II - euvolemic based on patient's description of symptoms - weighing daily and says that his weight has been stable; reminded him to call for an overnight weight gain of >2 pounds or [name] weekly weight gain of > 5 pounds - not adding salt and is trying to follow [name] low sodium diet  2: HTN- - not checking his BP at home - saw PCP (Jose [name]) 02/05/2019 - BMP from 02/08/2019 reviewed and showed sodium 139, potassium 4.8, creatinine 0.83 and GFR 119  3: Tobacco use-  - says that he's now smoking ~ 2 cigars daily instead of cigarettes - complete cessation discussed for 2 minutes with him and encouraged him to follow-up with PCP for smoking cessation assistance  COVID-19 Education: The signs and symptoms of COVID-19 were discussed with the patient and how to seek care  for testing (follow up with PCP or arrange E-visit).  The importance of social distancing was discussed today.  Patient Risk:   After full review of this patients clinical status, I feel that they are at least moderate risk at this time.  Time:   Today, I have spent 7 minutes with the patient with telehealth technology discussing medications, weight and symptoms to report.    Medication Adjustments/Labs and Tests Ordered: Current medicines are reviewed at length with the patient today.  Concerns regarding medicines are outlined above.   Tests Ordered: No orders of the defined types were placed in this encounter.  Medication Changes: Meds ordered this encounter  Medications  . amLODipine (NORVASC) 5 MG tablet    Sig:  Take 1 tablet (5 mg total) by mouth daily.    Dispense:  90 tablet    Refill:  3  . carvedilol (COREG) 25 MG tablet    Sig: Take 1 tablet (25 mg total) by mouth 2 (two) times daily with [name] meal.    Dispense:  180 tablet    Refill:  3  . furosemide (LASIX) 20 MG tablet    Sig: Take 1 tablet (20 mg total) by mouth daily.    Dispense:  90 tablet    Refill:  3  . hydrALAZINE (APRESOLINE) 25 MG tablet    Sig: Take 1 tablet (25 mg total) by mouth 3 (three) times daily.    Dispense:  270 tablet    Refill:  3  . isosorbide mononitrate (IMDUR) 30 MG 24 hr tablet    Sig: Take 1 tablet (30 mg total) by mouth daily.    Dispense:  90 tablet    Refill:  3  . simvastatin (ZOCOR) 20 MG tablet    Sig: Take 1 tablet (20 mg total) by mouth daily.    Dispense:  90 tablet    Refill:  3    Disposition:  Follow-up in 2 months or sooner for any questions/problems before then  Signed, Jose Graff, FNP  03/25/2019 10:43 AM    [facility] Heart Failure [name]

## 2019-03-27 ENCOUNTER — Telehealth: Payer: Self-pay

## 2019-04-02 ENCOUNTER — Ambulatory Visit: Payer: Self-pay | Admitting: Pharmacist

## 2019-04-02 ENCOUNTER — Telehealth: Payer: Self-pay

## 2019-04-02 NOTE — Chronic Care Management (AMB) (Signed)
  Chronic Care Management   Note  04/02/2019 Name: Jose [name]. MRN: [id number] DOB: May 06, 1971  Jose [name]. is a 48 y.o. year old male who is a primary care patient of Crissman, Jeannette How, MD. The CCM team was consulted for assistance with chronic disease management and care coordination needs.    Contacted patient telephonically to follow up on obtaining medications from [facility]. Left HIPAA compliant message for patient to return my call at his convenience.   Follow up plan: - If I do not hear back, will follow up with patient in the next 7-10 days  Catie Darnelle Maffucci, PharmD Clinical Pharmacist [place] [phone]

## 2019-04-05 ENCOUNTER — Ambulatory Visit: Payer: Self-pay | Admitting: Pharmacist

## 2019-04-05 ENCOUNTER — Telehealth: Payer: Self-pay

## 2019-04-05 NOTE — Chronic Care Management (AMB) (Signed)
  Chronic Care Management   Note  04/05/2019 Name: Jose [name]. MRN: [id number] DOB: 07-Oct-1971  Jose [name]. is a 48 y.o. year old male who is a primary care patient of Crissman, Jeannette How, MD. The CCM team was consulted for assistance with chronic disease management and care coordination needs.    Unsuccessful outreach attempt #2 to follow up on medication management. Left HIPAA compliant message for patient to return my call at his convenience. .  Follow up plan: - If I do not hear back, will follow up within the next 30 days  Catie Darnelle Maffucci, PharmD Clinical Pharmacist [place] [phone]

## 2019-04-09 ENCOUNTER — Ambulatory Visit: Payer: Self-pay | Admitting: Pharmacist

## 2019-04-09 ENCOUNTER — Ambulatory Visit: Payer: Self-pay | Admitting: *Deleted

## 2019-04-09 DIAGNOSIS — I5031 Acute diastolic (congestive) heart failure: Secondary | ICD-10-CM

## 2019-04-09 NOTE — Chronic Care Management (AMB) (Signed)
  Chronic Care Management   Note  04/09/2019 Name: Jose [name]. MRN: [id number] DOB: 08-Apr-1971  Jose Dakins. is a 48 y.o. year old male who is a primary care patient of Crissman, Jeannette How, MD. The CCM team was consulted for assistance with chronic disease management and care coordination needs.    Attempted to contact patient in a joint phone call with Janci Minor, RN. Left HIPAA compliant message on home and cell phones for patient to return my call at his convenience.   Follow up plan: - If I do not hear back, will follow up in the next 30 days  Catie Darnelle Maffucci, PharmD Clinical Pharmacist [place] [phone]

## 2019-04-12 ENCOUNTER — Telehealth: Payer: Self-pay

## 2019-04-15 NOTE — Chronic Care Management (AMB) (Signed)
Chronic Care Management   Follow Up Note   04/15/2019 Name: Jose [name]. MRN: [id number] DOB: 11-Aug-1971  Referred by: Jose Maple, MD Reason for referral : Chronic Care Management (HF)   Jose Dakins. is a 48 y.o. year old male who is a primary care patient of [name], Jose How, MD. The CCM team was consulted for assistance with chronic disease management and care coordination needs.    Review of patient status, including review of consultants reports, relevant laboratory and other test results, and collaboration with appropriate care team members and the patient's provider was performed as part of comprehensive patient evaluation and provision of chronic care management services.    Spoke to patient's mother briefly about how patient was doing. She stated he was doing fairly well. She reported they had improved their diet. Mother stated she was getting ready to leave to pick up the patient from work and for me to please call back at 1630 to speak with the patient. Attempted to call back at that time but patient nor mother answered.   Goals Addressed            This Visit's Progress   . heart failure (pt-stated)       Current Barriers:  Marland Kitchen Knowledge deficit related to basic heart failure pathophysiology and self care management . Patient does not have readable scale- Patient now has a scale . Patient does not have a b/p cuff . Financial strain  Case Manager Clinical Goal(s):  Marland Kitchen Over the next 60 days, patient will verbalize understanding of Heart Failure Action Plan and when to call doctor . Over the next 90 days, patient will take all Heart Failure mediations as prescribed . Over the next 60 days, patient will weigh daily and record (notifying MD of 3 lb weight gain over night or 5 lb in a week)  Interventions: (Spoke with mother/DPR, she does the cooking and grocery shopping) . Provided verbal education on low sodium diet . Provided written education on low sodium diet-plan  to mail-Patients mother has received low salt cook book and feels confident she can make more heart healthy meals . Assessed need for readable accurate scales in home- Since last conversation patient has obtained a scale and is weighing each morning. . Advised patient's mother of the need for the patient to weigh each morning after emptying bladder . Discussed importance of daily weight and advised patient's mother to encourage the patient to weigh and record daily-Mailing patient [facility] Calendar with weight log and b/p log . Discussed the importance of checking b/p daily and recording . Encouraged patient to obtain b/p monitor from [facility] . Talked with mother Jose [name](DPR) who stated patient was doing fairly well. She stated she had started fixing healthier low salt meals for him. She stated he would be home at 1630 and for me to call back at that time.  . Attempted a return call but patient nor mother answered the phone.   Patient Self Care Activities:  . Patient NOT recording weights daily . Patient does not check b/p at home . Patient is hoping to return to work soon  Please see past updates related to this goal by clicking on the "Past Updates" button in the selected goal         A HIPPA compliant phone message was left for the patient providing contact information and requesting a return call.  The care management team will reach out to the patient again  over the next 14 days.  The patient has been provided with contact information for the care management team and has been advised to call with any health related questions or concerns.    Jose Morse Minor RN, BSN Nurse Case Editor, commissioning [facility] Care Management  [phone]) Business Mobile

## 2019-04-15 NOTE — Patient Instructions (Signed)
Thank you allowing the Chronic Care Management Team to be a part of your care! It was a pleasure speaking with you today!   CCM (Chronic Care Management) Team   Janci Minor RN, BSN Nurse Care Coordinator  [phone]  Catie [facility] PharmD  Clinical Pharmacist  [phone]  Eula Fried LCSW Clinical Social Worker [phone]  Goals Addressed            This Visit's Progress   . heart failure (pt-stated)       Current Barriers:  Marland Kitchen Knowledge deficit related to basic heart failure pathophysiology and self care management . Patient does not have readable scale- Patient now has a scale . Patient does not have a b/p cuff . Financial strain  Case Manager Clinical Goal(s):  Marland Kitchen Over the next 60 days, patient will verbalize understanding of Heart Failure Action Plan and when to call doctor . Over the next 90 days, patient will take all Heart Failure mediations as prescribed . Over the next 60 days, patient will weigh daily and record (notifying MD of 3 lb weight gain over night or 5 lb in a week)  Interventions: (Spoke with mother/DPR, she does the cooking and grocery shopping) . Provided verbal education on low sodium diet . Provided written education on low sodium diet-plan to mail-Patients mother has received low salt cook book and feels confident she can make more heart healthy meals . Assessed need for readable accurate scales in home- Since last conversation patient has obtained a scale and is weighing each morning. . Advised patient's mother of the need for the patient to weigh each morning after emptying bladder . Discussed importance of daily weight and advised patient's mother to encourage the patient to weigh and record daily-Mailing patient [facility] Calendar with weight log and b/p log . Discussed the importance of checking b/p daily and recording . Encouraged patient to obtain b/p monitor from [facility] . Talked with mother Ms. Hellinger(DPR) who stated patient was doing fairly  well. She stated she had started fixing healthier low salt meals for him. She stated he would be home at 1630 and for me to call back at that time.  . Attempted a return call but patient nor mother answered the phone.   Patient Self Care Activities:  . Patient NOT recording weights daily . Patient does not check b/p at home . Patient is hoping to return to work soon  Please see past updates related to this goal by clicking on the "Past Updates" button in the selected goal        The patient verbalized understanding of instructions provided today and declined a print copy of patient instruction materials.   The patient has been provided with contact information for the care management team and has been advised to call with any health related questions or concerns.

## 2019-04-24 ENCOUNTER — Telehealth: Payer: Self-pay

## 2019-04-25 ENCOUNTER — Telehealth: Payer: Self-pay | Admitting: Pharmacy Technician

## 2019-04-25 NOTE — Telephone Encounter (Signed)
Patient failed to provide requested financial documentation.  Financial documentation is required in order to determine patient's eligibility for [facility] program.  No additional medication assistance will be provided until patient provides requested financial documentation.  Patient notified by letter.  Velda Shell, CPhT Recertification Specialist

## 2019-04-26 ENCOUNTER — Telehealth: Payer: Self-pay

## 2019-04-26 ENCOUNTER — Ambulatory Visit: Payer: Self-pay | Admitting: Pharmacist

## 2019-04-26 NOTE — Chronic Care Management (AMB) (Signed)
  Chronic Care Management   Note  04/26/2019 Name: Jose [name]. MRN: [id number] DOB: 07/04/1971  Jose [name]. is a 48 y.o. year old male who is a primary care patient of Crissman, Jeannette How, MD. The CCM team was consulted for assistance with chronic disease management and care coordination needs.    Unsuccessful outreach attempt #4 to discuss medication access, BP and weight monitoring, and medication management with patient. I will be happy to outreach the patient again in the future if new needs are identified.   Follow up plan: - Patient has my contact information for any future questions or concerns.   Catie Darnelle Maffucci, PharmD Clinical Pharmacist [place] [phone]

## 2019-05-07 ENCOUNTER — Telehealth: Payer: Self-pay

## 2019-05-17 ENCOUNTER — Telehealth: Payer: Self-pay

## 2019-05-23 ENCOUNTER — Telehealth: Payer: Self-pay

## 2019-05-24 NOTE — Progress Notes (Deleted)
   Patient ID: Jose Dakins., male    DOB: 1971/09/22, 48 y.o.   MRN: [id number]  HPI  Jose [name] is a 48 y/o male with a history of  Echo report from 01/29/2019 reviewed and showed an EF of 60-65%  Admitted 01/28/2019 due to acute HF exacerbation. Cardiology consult obtained. Initially given IV lasix and the transitioned to oral medications. Had acute kidney injury so ACE-I was stopped and furosemide was decreased. Discharged after 2 days.   He presents today for a follow-up visit with a chief complaint of   Review of Systems    Physical Exam    Assessment & Plan:  1. Chronic heart failure with preserved ejection fraction- - NYHA class II - euvolemic based on patient's description of symptoms - weighing daily and says that his weight has been stable; reminded him to call for an overnight weight gain of >2 pounds or a weekly weight gain of > 5 pounds - not adding salt and is trying to follow a low sodium diet  2: HTN- - not checking his BP at home - saw PCP (Crissman) 02/05/2019 - BMP from 02/08/2019 reviewed and showed sodium 139, potassium 4.8, creatinine 0.83 and GFR 119  3: Tobacco use-  - says that he's now smoking ~ 2 cigars daily instead of cigarettes - complete cessation discussed for 2 minutes with him and encouraged him to follow-up with PCP for smoking cessation assistance

## 2019-05-27 ENCOUNTER — Ambulatory Visit: Payer: Self-pay | Admitting: Family

## 2019-06-18 ENCOUNTER — Telehealth: Payer: Self-pay

## 2019-06-29 NOTE — Progress Notes (Signed)
Patient ID: Jose Dakins., male    DOB: [birth year], 48 y.o.   MRN: [id number]  HPI  Jose [name] is a 48 y/o male with a history of hyperlipidemia, HTN, current tobacco/ alcohol use and chronic heart failure.   Echo report from 01/29/2019 reviewed and showed an EF of 60-65%,  Admitted 01/28/2019 due to acute on chronic HF. Cardiology consult obtained. Initially given IV lasix and then transitioned to oral diuretics. Discharged after 2 days.   He presents today for a follow-up visit with a chief complaint of moderate shortness of breath upon minimal exertion. Endorses being short of breath even while talking at times. Has associated intermittent chest tightness/ pain, abdominal distention and weight gain along with this. He denies any cough, pedal edema, palpitations, fatigue, dizziness or difficulty sleeping.   Hasn't been weighing daily but did weigh this morning and says that he's gained 6 pounds overnight.   Past Medical History:  Diagnosis Date  . Alcohol abuse   . Cervicalgia   . CHF (congestive heart failure) ([place])   . Hyperlipidemia   . Hypertension   . Seizures ([place])    Past Surgical History:  Procedure Laterality Date  . COLONOSCOPY WITH PROPOFOL N/A 01/16/2018   Procedure: COLONOSCOPY WITH PROPOFOL;  Surgeon: Lin Landsman, MD;  Location: [facility] ENDOSCOPY;  Service: Gastroenterology;  Laterality: N/A;  . ESOPHAGOGASTRODUODENOSCOPY (EGD) WITH PROPOFOL N/A 01/16/2018   Procedure: ESOPHAGOGASTRODUODENOSCOPY (EGD) WITH PROPOFOL;  Surgeon: Lin Landsman, MD;  Location: [facility] ENDOSCOPY;  Service: Gastroenterology;  Laterality: N/A;   Family History  Problem Relation Age of Onset  . Diabetes Mother   . Colon cancer Mother   . Hypertension Father   . Multiple sclerosis Father    Social History   Tobacco Use  . Smoking status: Current Every Day Smoker    Packs/day: 0.50  . Smokeless tobacco: Former Network engineer Use Topics  . Alcohol use: Yes    Alcohol/week: 28.0  standard drinks    Types: 7 Cans of beer, 21 Shots of liquor per week    Comment: half pint liquor per day   No Known Allergies Prior to Admission medications   Medication Sig Start Date End Date Taking? Authorizing Provider  amLODipine (NORVASC) 5 MG tablet Take 1 tablet (5 mg total) by mouth daily. 03/25/19  Yes Hackney, Otila Kluver A, FNP  carbamazepine (TEGRETOL-XR) 200 MG 12 hr tablet Take 1 tablet (200 mg total) by mouth 2 (two) times daily. 02/13/19  Yes Guadalupe Maple, MD  carvedilol (COREG) 25 MG tablet Take 1 tablet (25 mg total) by mouth 2 (two) times daily with a meal. 03/25/19  Yes Hackney, Tina A, FNP  furosemide (LASIX) 20 MG tablet Take 1 tablet (20 mg total) by mouth daily. 03/25/19  Yes Darylene Price A, FNP  hydrALAZINE (APRESOLINE) 25 MG tablet Take 1 tablet (25 mg total) by mouth 3 (three) times daily. 03/25/19  Yes Darylene Price A, FNP  isosorbide mononitrate (IMDUR) 30 MG 24 hr tablet Take 1 tablet (30 mg total) by mouth daily. 03/25/19  Yes Hackney, Otila Kluver A, FNP  simvastatin (ZOCOR) 20 MG tablet Take 1 tablet (20 mg total) by mouth daily. 03/25/19  Yes Alisa Graff, FNP    Review of Systems  Constitutional: Negative for appetite change and fatigue.  HENT: Positive for hearing loss. Negative for congestion and rhinorrhea.   Eyes: Negative.   Respiratory: Positive for chest tightness (comes and goes) and shortness of breath (with minimal  exertion). Negative for cough.   Cardiovascular: Positive for chest pain (with exertion). Negative for leg swelling.  Gastrointestinal: Positive for abdominal distention. Negative for abdominal pain, constipation, diarrhea and nausea.  Endocrine: Negative.   Genitourinary: Positive for frequency.  Musculoskeletal: Negative.   Skin: Negative.   Neurological: Negative for dizziness and light-headedness.  Hematological: Negative.   Psychiatric/Behavioral: Negative.    Vitals:   07/01/19 1405  BP: (!) 134/97  Pulse: 79  Resp: 20  Temp: 98.3  F (36.8 C)  TempSrc: Oral  SpO2: 94%  Weight: 238 lb 6.4 oz (108.1 kg)  Height: 5\' 10"  (1.778 m)   Wt Readings from Last 3 Encounters:  07/01/19 238 lb 6.4 oz (108.1 kg)  03/25/19 230 lb (104.3 kg)  02/07/19 227 lb (103 kg)   Lab Results  Component Value Date   CREATININE 0.83 02/08/2019   CREATININE 1.58 (H) 01/30/2019   CREATININE 0.67 01/29/2019    Physical Exam Vitals signs and nursing note reviewed.  Constitutional:      Appearance: Normal appearance.  HENT:     Head: Normocephalic and atraumatic.     Right Ear: Decreased hearing noted.     Left Ear: Decreased hearing noted.  Neck:     Musculoskeletal: Normal range of motion and neck supple.     Vascular: JVD present.  Cardiovascular:     Rate and Rhythm: Normal rate and regular rhythm.  Pulmonary:     Effort: Pulmonary effort is normal.     Breath sounds: Examination of the right-upper field reveals rales. Examination of the left-upper field reveals rales. Examination of the right-lower field reveals rhonchi and rales. Examination of the left-lower field reveals rhonchi and rales. Rhonchi and rales present.  Abdominal:     General: There is distension.     Tenderness: There is no abdominal tenderness.  Musculoskeletal:        General: No swelling or tenderness.  Skin:    General: Skin is warm and dry.  Neurological:     General: No focal deficit present.     Mental Status: He is alert and oriented to person, place, and time.  Psychiatric:        Mood and Affect: Mood normal.        Behavior: Behavior normal.     Assessment & Plan:  1. Acute on Chronic heart failure with preserved ejection fraction- - NYHA class III/IV - moderately fluid overloaded today with self-reported weight gain, abdominal distention and worsening shortness of breath - not weighing daily but did weigh this morning and says that he gained 6 pounds overnight; encouraged him to resume daily weighing and call for an overnight weight gain  of >2 pounds or a weekly weight gain of > 5 pounds - will send patient for 80mg  IV lasix along with 58meq potassium - will get BNP/BMP today as well - not adding salt and is trying to follow a low sodium diet  2: HTN- - BP mildly elevated today - saw PCP (Crissman) 02/05/2019 - BMP from 02/08/2019 reviewed and showed sodium 139, potassium 4.8, creatinine 0.83 and GFR 119  3: Tobacco use-  - says that he's now smoking ~ 1-2 cigars daily  - complete cessation discussed for 2 minutes with him  Patient did not bring her medications nor a list. Each medication was verbally reviewed with the patient and she was encouraged to bring the bottles to every visit to confirm accuracy of list.  Return tomorrow. Did advise patient that  should his symptoms worsen overnight that he should present to the ED. Explained that due to severity of symptoms, he may need multiple doses of IV lasix.

## 2019-07-01 ENCOUNTER — Ambulatory Visit: Payer: Managed Care, Other (non HMO) | Admitting: Family

## 2019-07-01 ENCOUNTER — Encounter: Payer: Self-pay | Admitting: Family

## 2019-07-01 ENCOUNTER — Ambulatory Visit
Admission: RE | Admit: 2019-07-01 | Discharge: 2019-07-01 | Disposition: A | Discharge status: Home / Self Care | Payer: Managed Care, Other (non HMO) | Source: Ambulatory Visit | Attending: Family | Admitting: Family

## 2019-07-01 ENCOUNTER — Other Ambulatory Visit: Payer: Self-pay | Admitting: Family

## 2019-07-01 ENCOUNTER — Telehealth: Payer: Self-pay | Admitting: Family

## 2019-07-01 ENCOUNTER — Other Ambulatory Visit: Payer: Self-pay

## 2019-07-01 VITALS — BP 134/97 | HR 79 | Temp 98.3°F | Resp 20 | Ht 70.0 in | Wt 238.4 lb

## 2019-07-01 DIAGNOSIS — R569 Unspecified convulsions: Secondary | ICD-10-CM | POA: Insufficient documentation

## 2019-07-01 DIAGNOSIS — Z800 Family history of malignant neoplasm of digestive organs: Secondary | ICD-10-CM | POA: Insufficient documentation

## 2019-07-01 DIAGNOSIS — F17200 Nicotine dependence, unspecified, uncomplicated: Secondary | ICD-10-CM

## 2019-07-01 DIAGNOSIS — Z79899 Other long term (current) drug therapy: Secondary | ICD-10-CM | POA: Insufficient documentation

## 2019-07-01 DIAGNOSIS — I5033 Acute on chronic diastolic (congestive) heart failure: Secondary | ICD-10-CM

## 2019-07-01 DIAGNOSIS — F1011 Alcohol abuse, in remission: Secondary | ICD-10-CM | POA: Insufficient documentation

## 2019-07-01 DIAGNOSIS — Z833 Family history of diabetes mellitus: Secondary | ICD-10-CM | POA: Diagnosis not present

## 2019-07-01 DIAGNOSIS — E785 Hyperlipidemia, unspecified: Secondary | ICD-10-CM | POA: Diagnosis not present

## 2019-07-01 DIAGNOSIS — Z8249 Family history of ischemic heart disease and other diseases of the circulatory system: Secondary | ICD-10-CM | POA: Insufficient documentation

## 2019-07-01 DIAGNOSIS — I110 Hypertensive heart disease with heart failure: Secondary | ICD-10-CM | POA: Diagnosis not present

## 2019-07-01 DIAGNOSIS — F17210 Nicotine dependence, cigarettes, uncomplicated: Secondary | ICD-10-CM | POA: Diagnosis not present

## 2019-07-01 DIAGNOSIS — I10 Essential (primary) hypertension: Secondary | ICD-10-CM

## 2019-07-01 LAB — BASIC METABOLIC PANEL
Anion gap: 9 (ref 5–15)
BUN: 12 mg/dL (ref 6–20)
CO2: 26 mmol/L (ref 22–32)
Calcium: 8.5 mg/dL — ABNORMAL LOW (ref 8.9–10.3)
Chloride: 106 mmol/L (ref 98–111)
Creatinine, Ser: 0.53 mg/dL — ABNORMAL LOW (ref 0.61–1.24)
GFR calc Af Amer: >60 mL/min (ref >60)
GFR calc non Af Amer: >60 mL/min (ref >60)
Glucose, Bld: 100 mg/dL — ABNORMAL HIGH (ref 70–99)
Potassium: 3.2 mmol/L — ABNORMAL LOW (ref 3.5–5.1)
Sodium: 141 mmol/L (ref 135–145)

## 2019-07-01 LAB — BRAIN NATRIURETIC PEPTIDE: B Natriuretic Peptide: 29 pg/mL (ref 0.0–100.0)

## 2019-07-01 MED ORDER — POTASSIUM CHLORIDE CRYS ER 20 MEQ PO TBCR
EXTENDED_RELEASE_TABLET | ORAL | Status: AC
  Filled 2019-07-01: qty 2

## 2019-07-01 MED ORDER — POTASSIUM CHLORIDE CRYS ER 20 MEQ PO TBCR: 40.0000 meq | EXTENDED_RELEASE_TABLET | Freq: Once | ORAL | Status: DC

## 2019-07-01 MED ORDER — POTASSIUM CHLORIDE CRYS ER 20 MEQ PO TBCR
40.0000 meq | EXTENDED_RELEASE_TABLET | Freq: Once | ORAL | Status: DC
  Administered 2019-07-01: 40 meq via ORAL

## 2019-07-01 MED ORDER — POTASSIUM CHLORIDE CRYS ER 20 MEQ PO TBCR: 80.0000 meq | EXTENDED_RELEASE_TABLET | Freq: Once | ORAL | Status: DC

## 2019-07-01 MED ORDER — FUROSEMIDE 10 MG/ML IJ SOLN
80.0000 mg | Freq: Once | INTRAMUSCULAR | Status: AC
  Administered 2019-07-01: 80 mg via INTRAVENOUS

## 2019-07-01 MED ORDER — POTASSIUM CHLORIDE CRYS ER 20 MEQ PO TBCR: 40.0000 meq | EXTENDED_RELEASE_TABLET | Freq: Once | ORAL | 0 refills | Status: DC

## 2019-07-01 NOTE — Telephone Encounter (Signed)
Received call from [place] from PAT. She called to report a potassium level of 3.2 and he's getting ready to receive 80mg  IV lasix. Riccardo Dubin to give him the 72meq PO potassium now and have called in another 78meq PO potassium for him to take 4 hours later. Will see patient back in clinic tomorrow.

## 2019-07-01 NOTE — OR Nursing (Signed)
Notified T Hackney FNP  of lab results, give meds as ordered and patient to pick up prescription for potassium on his way  home.  Patient instructed to take medication at 8:00 this pm (per [facility] FNP)  patient verbalized understanding.  Patient has appointment tomorrow at 2:00 with [facility] FNP.

## 2019-07-01 NOTE — Patient Instructions (Signed)
Continue weighing daily and call for an overnight weight gain of > 2 pounds or a weekly weight gain of >5 pounds. 

## 2019-07-02 ENCOUNTER — Encounter: Payer: Self-pay | Admitting: Family

## 2019-07-02 ENCOUNTER — Other Ambulatory Visit: Payer: Self-pay

## 2019-07-02 ENCOUNTER — Ambulatory Visit
Admission: RE | Admit: 2019-07-02 | Discharge: 2019-07-02 | Disposition: A | Discharge status: Home / Self Care | Payer: Managed Care, Other (non HMO) | Source: Ambulatory Visit | Attending: Family | Admitting: Family

## 2019-07-02 ENCOUNTER — Ambulatory Visit: Payer: Managed Care, Other (non HMO) | Admitting: Family

## 2019-07-02 ENCOUNTER — Other Ambulatory Visit: Payer: Self-pay | Admitting: Family

## 2019-07-02 VITALS — BP 160/98 | HR 78 | Temp 98.9°F | Resp 18 | Ht 70.0 in | Wt 236.4 lb

## 2019-07-02 DIAGNOSIS — Z833 Family history of diabetes mellitus: Secondary | ICD-10-CM | POA: Diagnosis not present

## 2019-07-02 DIAGNOSIS — E785 Hyperlipidemia, unspecified: Secondary | ICD-10-CM | POA: Diagnosis not present

## 2019-07-02 DIAGNOSIS — F17200 Nicotine dependence, unspecified, uncomplicated: Secondary | ICD-10-CM

## 2019-07-02 DIAGNOSIS — I5033 Acute on chronic diastolic (congestive) heart failure: Secondary | ICD-10-CM

## 2019-07-02 DIAGNOSIS — Z800 Family history of malignant neoplasm of digestive organs: Secondary | ICD-10-CM | POA: Insufficient documentation

## 2019-07-02 DIAGNOSIS — I110 Hypertensive heart disease with heart failure: Secondary | ICD-10-CM | POA: Diagnosis not present

## 2019-07-02 DIAGNOSIS — Z8249 Family history of ischemic heart disease and other diseases of the circulatory system: Secondary | ICD-10-CM | POA: Diagnosis not present

## 2019-07-02 DIAGNOSIS — F17290 Nicotine dependence, other tobacco product, uncomplicated: Secondary | ICD-10-CM | POA: Insufficient documentation

## 2019-07-02 DIAGNOSIS — I509 Heart failure, unspecified: Secondary | ICD-10-CM | POA: Diagnosis present

## 2019-07-02 DIAGNOSIS — Z820 Family history of epilepsy and other diseases of the nervous system: Secondary | ICD-10-CM | POA: Diagnosis not present

## 2019-07-02 DIAGNOSIS — Z79899 Other long term (current) drug therapy: Secondary | ICD-10-CM | POA: Insufficient documentation

## 2019-07-02 DIAGNOSIS — I10 Essential (primary) hypertension: Secondary | ICD-10-CM

## 2019-07-02 LAB — BASIC METABOLIC PANEL
Anion gap: 11 (ref 5–15)
BUN: 12 mg/dL (ref 6–20)
CO2: 24 mmol/L (ref 22–32)
Calcium: 8.9 mg/dL (ref 8.9–10.3)
Chloride: 107 mmol/L (ref 98–111)
Creatinine, Ser: 0.58 mg/dL — ABNORMAL LOW (ref 0.61–1.24)
GFR calc Af Amer: >60 mL/min (ref >60)
GFR calc non Af Amer: >60 mL/min (ref >60)
Glucose, Bld: 95 mg/dL (ref 70–99)
Potassium: 3.4 mmol/L — ABNORMAL LOW (ref 3.5–5.1)
Sodium: 142 mmol/L (ref 135–145)

## 2019-07-02 LAB — BRAIN NATRIURETIC PEPTIDE: B Natriuretic Peptide: 32 pg/mL (ref 0.0–100.0)

## 2019-07-02 MED ORDER — FUROSEMIDE 10 MG/ML IJ SOLN
80.0000 mg | Freq: Once | INTRAMUSCULAR | Status: AC
  Administered 2019-07-02: 80 mg via INTRAVENOUS

## 2019-07-02 MED ORDER — POTASSIUM CHLORIDE CRYS ER 20 MEQ PO TBCR
40.0000 meq | EXTENDED_RELEASE_TABLET | Freq: Once | ORAL | Status: AC
  Administered 2019-07-02: 40 meq via ORAL

## 2019-07-02 NOTE — Progress Notes (Signed)
Patient ID: Jose Dakins., male    DOB: 03-Sep-1971, 48 y.o.   MRN: [id number]  HPI  Jose [name] is a 48 y/o male with a history of hyperlipidemia, HTN, current tobacco/ alcohol use and chronic heart failure.   Echo report from 01/29/2019 reviewed and showed an EF of 60-65%,  Admitted 01/28/2019 due to acute on chronic HF. Cardiology consult obtained. Initially given IV lasix and then transitioned to oral diuretics. Discharged after 2 days.   He presents today for a follow-up visit with a chief complaint of minimal shortness of breath with little exertion. He does feel like his breathing is "much better" from yesterday and significantly better from 2 days ago. He has associated abdominal distention although says this is "much better" as well. He denies any difficulty sleeping, palpitations, pedal edema, chest pain, cough, dizziness, fatigue or weight gain. Was able to walk to the office today from the [facility] and was less short of breath than yesterday.   He received 80mg  IV lasix/ 64meq PO potassium yesterday.    Past Medical History:  Diagnosis Date  . Alcohol abuse   . Cervicalgia   . CHF (congestive heart failure) ([place])   . Hyperlipidemia   . Hypertension   . Seizures ([place])    Past Surgical History:  Procedure Laterality Date  . COLONOSCOPY WITH PROPOFOL N/A 01/16/2018   Procedure: COLONOSCOPY WITH PROPOFOL;  Surgeon: Lin Landsman, MD;  Location: [facility] ENDOSCOPY;  Service: Gastroenterology;  Laterality: N/A;  . ESOPHAGOGASTRODUODENOSCOPY (EGD) WITH PROPOFOL N/A 01/16/2018   Procedure: ESOPHAGOGASTRODUODENOSCOPY (EGD) WITH PROPOFOL;  Surgeon: Lin Landsman, MD;  Location: [facility] ENDOSCOPY;  Service: Gastroenterology;  Laterality: N/A;   Family History  Problem Relation Age of Onset  . Diabetes Mother   . Colon cancer Mother   . Hypertension Father   . Multiple sclerosis Father    Social History   Tobacco Use  . Smoking status: Current Every Day Smoker    Packs/day:  0.50  . Smokeless tobacco: Former Network engineer Use Topics  . Alcohol use: Yes    Alcohol/week: 28.0 standard drinks    Types: 7 Cans of beer, 21 Shots of liquor per week    Comment: half pint liquor per day   No Known Allergies  Prior to Admission medications   Medication Sig Start Date End Date Taking? Authorizing Provider  amLODipine (NORVASC) 5 MG tablet Take 1 tablet (5 mg total) by mouth daily. 03/25/19  Yes Hackney, Otila Kluver A, FNP  carbamazepine (TEGRETOL-XR) 200 MG 12 hr tablet Take 1 tablet (200 mg total) by mouth 2 (two) times daily. 02/13/19  Yes Guadalupe Maple, MD  carvedilol (COREG) 25 MG tablet Take 1 tablet (25 mg total) by mouth 2 (two) times daily with a meal. 03/25/19  Yes Hackney, Tina A, FNP  furosemide (LASIX) 20 MG tablet Take 1 tablet (20 mg total) by mouth daily. 03/25/19  Yes Darylene Price A, FNP  hydrALAZINE (APRESOLINE) 25 MG tablet Take 1 tablet (25 mg total) by mouth 3 (three) times daily. 03/25/19  Yes Hackney, Otila Kluver A, FNP  simvastatin (ZOCOR) 20 MG tablet Take 1 tablet (20 mg total) by mouth daily. 03/25/19  Yes Darylene Price A, FNP  isosorbide mononitrate (IMDUR) 30 MG 24 hr tablet Take 1 tablet (30 mg total) by mouth daily. 03/25/19   Alisa Graff, FNP  potassium chloride SA (K-DUR) 20 MEQ tablet Take 2 tablets (40 mEq total) by mouth once for 1 dose.  07/01/19 07/01/19  Alisa Graff, FNP    Review of Systems  Constitutional: Negative for appetite change and fatigue.  HENT: Positive for hearing loss. Negative for congestion and rhinorrhea.   Eyes: Negative.   Respiratory: Positive for shortness of breath (with minimal exertion "a whole lot better"). Negative for cough and chest tightness.   Cardiovascular: Negative for chest pain, palpitations and leg swelling.  Gastrointestinal: Positive for abdominal distention ("improving"). Negative for abdominal pain, constipation, diarrhea and nausea.  Endocrine: Negative.   Genitourinary: Negative.   Musculoskeletal:  Negative.   Skin: Negative.   Neurological: Negative for dizziness and light-headedness.  Hematological: Negative.   Psychiatric/Behavioral: Negative for dysphoric mood and sleep disturbance. The patient is not nervous/anxious.    Vitals:   07/03/19 1431  BP: (!) 173/103  Pulse: 74  Resp: 20  Temp: 98.4 F (36.9 C)  TempSrc: Oral  SpO2: 95%  Weight: 233 lb 6.4 oz (105.9 kg)  Height: 5\' 10"  (1.778 m)   Wt Readings from Last 3 Encounters:  07/03/19 233 lb 6.4 oz (105.9 kg)  07/02/19 236 lb 6.4 oz (107.2 kg)  07/01/19 238 lb 6.4 oz (108.1 kg)    Physical Exam Vitals signs and nursing note reviewed.  Constitutional:      Appearance: Normal appearance.  HENT:     Head: Normocephalic and atraumatic.     Right Ear: Decreased hearing noted.     Left Ear: Decreased hearing noted.  Neck:     Musculoskeletal: Normal range of motion and neck supple.     Vascular: JVD present.  Cardiovascular:     Rate and Rhythm: Normal rate and regular rhythm.  Pulmonary:     Effort: Pulmonary effort is normal.     Breath sounds: Examination of the right-lower field reveals wheezing. Examination of the left-lower field reveals wheezing. Wheezing present. No rhonchi or rales.  Abdominal:     General: There is distension.     Tenderness: There is no abdominal tenderness.     Comments: Much softer  Musculoskeletal:        General: No swelling or tenderness.  Skin:    General: Skin is warm and dry.  Neurological:     General: No focal deficit present.     Mental Status: He is alert and oriented to person, place, and time.  Psychiatric:        Mood and Affect: Mood normal.        Behavior: Behavior normal.     Assessment & Plan:  1. Chronic heart failure with preserved ejection fraction- - NYHA class II/III - mildly fluid overloaded today but better than yesterday -  encouraged him to resume daily weighing and call for an overnight weight gain of >2 pounds or a weekly weight gain of > 5  pounds - weight down 3 pounds from last visit here yesterday - received 80mg  IV lasix/ 73meq PO potassium yesterday - will increase his furosemide to 40mg  daily along with 72meq potassium daily as well - will check BMP next week - patient walked to the office today and was short of breath but less so than yesterday - not adding salt and is trying to follow a low sodium diet - BNP 07/02/2019 was 32.0  2: HTN- - BP elevated even upon recheck with manual cuff - increasing diuretic per above; may need to add lisinopril - saw PCP (Crissman) 02/05/2019 - BMP from 07/02/2019 reviewed and showed sodium 142, potassium 3.4, creatinine 0.58 and GFR >60  3: Tobacco use-  - says that he's smoking ~ 1-2 cigars daily  - complete cessation discussed for 2 minutes with him  Patient did not bring his medications nor a list. Each medication was verbally reviewed with the patient and he was encouraged to bring the bottles to every visit to confirm accuracy of list.  Return in 1 week or sooner for any questions/problems before then.

## 2019-07-02 NOTE — Progress Notes (Signed)
Patient ID: Jose Dakins., male    DOB: 1971-03-26, 48 y.o.   MRN: [id number]  HPI  Jose [name] is a 48 y/o male with a history of hyperlipidemia, HTN, current tobacco/ alcohol use and chronic heart failure.   Echo report from 01/29/2019 reviewed and showed an EF of 60-65%,  Admitted 01/28/2019 due to acute on chronic HF. Cardiology consult obtained. Initially given IV lasix and then transitioned to oral diuretics. Discharged after 2 days.   He presents today for a follow-up visit with a chief complaint of moderate shortness of breath upon minimal exertion. He describes this as being slightly better than yesterday. He has associated chest tightness and abdominal distention (improving) along with this. He denies any difficulty sleeping, palpitations, pedal edema, chest pain, cough, dizziness or fatigue. He didn't weigh himself this morning.   He received 80mg  IV lasix/ 94meq PO potassium total yesterday.     Past Medical History:  Diagnosis Date  . Alcohol abuse   . Cervicalgia   . CHF (congestive heart failure) ([place])   . Hyperlipidemia   . Hypertension   . Seizures ([place])    Past Surgical History:  Procedure Laterality Date  . COLONOSCOPY WITH PROPOFOL N/A 01/16/2018   Procedure: COLONOSCOPY WITH PROPOFOL;  Surgeon: Lin Landsman, MD;  Location: [facility] ENDOSCOPY;  Service: Gastroenterology;  Laterality: N/A;  . ESOPHAGOGASTRODUODENOSCOPY (EGD) WITH PROPOFOL N/A 01/16/2018   Procedure: ESOPHAGOGASTRODUODENOSCOPY (EGD) WITH PROPOFOL;  Surgeon: Lin Landsman, MD;  Location: [facility] ENDOSCOPY;  Service: Gastroenterology;  Laterality: N/A;   Family History  Problem Relation Age of Onset  . Diabetes Mother   . Colon cancer Mother   . Hypertension Father   . Multiple sclerosis Father    Social History   Tobacco Use  . Smoking status: Current Every Day Smoker    Packs/day: 0.50  . Smokeless tobacco: Former Network engineer Use Topics  . Alcohol use: Yes    Alcohol/week: 28.0  standard drinks    Types: 7 Cans of beer, 21 Shots of liquor per week    Comment: half pint liquor per day   No Known Allergies   Review of Systems  Constitutional: Negative for appetite change and fatigue.  HENT: Positive for hearing loss. Negative for congestion and rhinorrhea.   Eyes: Negative.   Respiratory: Positive for chest tightness (comes and goes) and shortness of breath (with minimal exertion). Negative for cough.   Cardiovascular: Negative for chest pain, palpitations and leg swelling.  Gastrointestinal: Positive for abdominal distention ("improving"). Negative for abdominal pain, constipation, diarrhea and nausea.  Endocrine: Negative.   Genitourinary: Negative.   Musculoskeletal: Negative.   Skin: Negative.   Neurological: Negative for dizziness and light-headedness.  Hematological: Negative.   Psychiatric/Behavioral: Negative for dysphoric mood and sleep disturbance. The patient is not nervous/anxious.     Vitals:   07/02/19 1400 07/02/19 1426  BP: (!) 168/110 (!) 160/98  Pulse: 78   Resp: 18   Temp: 98.9 F (37.2 C)   TempSrc: Oral   SpO2: 95%   Weight: 236 lb 6.4 oz (107.2 kg)   Height: 5\' 10"  (1.778 m)    Wt Readings from Last 3 Encounters:  07/02/19 236 lb 6.4 oz (107.2 kg)  07/01/19 238 lb 6.4 oz (108.1 kg)  03/25/19 230 lb (104.3 kg)    Lab Results  Component Value Date   CREATININE 0.53 (L) 07/01/2019   CREATININE 0.83 02/08/2019   CREATININE 1.58 (H) 01/30/2019    Physical  Exam Vitals signs and nursing note reviewed.  Constitutional:      Appearance: Normal appearance.  HENT:     Head: Normocephalic and atraumatic.     Right Ear: Decreased hearing noted.     Left Ear: Decreased hearing noted.  Neck:     Musculoskeletal: Normal range of motion and neck supple.     Vascular: JVD present.  Cardiovascular:     Rate and Rhythm: Normal rate and regular rhythm.  Pulmonary:     Effort: Pulmonary effort is normal.     Breath sounds:  Examination of the left-upper field reveals wheezing. Examination of the right-lower field reveals wheezing. Examination of the left-lower field reveals wheezing. Wheezing present. No rhonchi or rales.  Abdominal:     General: There is distension.     Tenderness: There is no abdominal tenderness.  Musculoskeletal:        General: No swelling or tenderness.  Skin:    General: Skin is warm and dry.  Neurological:     General: No focal deficit present.     Mental Status: He is alert and oriented to person, place, and time.  Psychiatric:        Mood and Affect: Mood normal.        Behavior: Behavior normal.     Assessment & Plan:  1. Acute on Chronic heart failure with preserved ejection fraction- - NYHA class III - continues to be fluid overloaded today although better from yesterday - weight down 2 pounds from last visit here yesterday although he has steel toed boots on today and he didn't have those on yesterday; he guesses his boots weigh 3-4 pounds -  encouraged him to resume daily weighing and call for an overnight weight gain of >2 pounds or a weekly weight gain of > 5 pounds - received 80mg  IV lasix/ 77meq PO potassium yesterday - will send patient back for additional 80mg  IV lasix/ 53meq PO potassium today - BMP/ BNP drawn today - anticipate increasing daily diuretic dose and will probably need daily potassium as well.  - patient walked to the office today although was quite short of breath once he got here; he also worked today - not adding salt and is trying to follow a low sodium diet - BNP 07/01/2019 was 29.0  2: HTN- - BP elevated initially and remained slightly elevated upon recheck with manual cuff - saw PCP (Jose [name]) 02/05/2019 - BMP from 07/01/2019 reviewed and showed sodium 141, potassium 3.2, creatinine 0.53 and GFR >60  3: Tobacco use-  - says that he's smoking ~ 1-2 cigars daily  - complete cessation discussed for 2 minutes with him  Patient did not bring his  medications nor a list. Each medication was verbally reviewed with the patient and he was encouraged to bring the bottles to every visit to confirm accuracy of list.  Return tomorrow for recheck of symptoms.

## 2019-07-02 NOTE — Patient Instructions (Signed)
Continue weighing daily and call for an overnight weight gain of > 2 pounds or a weekly weight gain of >5 pounds. 

## 2019-07-03 ENCOUNTER — Other Ambulatory Visit: Payer: Self-pay

## 2019-07-03 ENCOUNTER — Ambulatory Visit: Payer: Managed Care, Other (non HMO) | Attending: Family | Admitting: Family

## 2019-07-03 ENCOUNTER — Encounter: Payer: Self-pay | Admitting: Family

## 2019-07-03 VITALS — BP 173/103 | HR 74 | Temp 98.4°F | Resp 20 | Ht 70.0 in | Wt 233.4 lb

## 2019-07-03 DIAGNOSIS — E785 Hyperlipidemia, unspecified: Secondary | ICD-10-CM | POA: Insufficient documentation

## 2019-07-03 DIAGNOSIS — I10 Essential (primary) hypertension: Secondary | ICD-10-CM

## 2019-07-03 DIAGNOSIS — I5032 Chronic diastolic (congestive) heart failure: Secondary | ICD-10-CM | POA: Insufficient documentation

## 2019-07-03 DIAGNOSIS — I110 Hypertensive heart disease with heart failure: Secondary | ICD-10-CM | POA: Diagnosis not present

## 2019-07-03 DIAGNOSIS — Z79899 Other long term (current) drug therapy: Secondary | ICD-10-CM | POA: Insufficient documentation

## 2019-07-03 DIAGNOSIS — R140 Abdominal distension (gaseous): Secondary | ICD-10-CM | POA: Diagnosis not present

## 2019-07-03 DIAGNOSIS — F17290 Nicotine dependence, other tobacco product, uncomplicated: Secondary | ICD-10-CM | POA: Insufficient documentation

## 2019-07-03 DIAGNOSIS — F17200 Nicotine dependence, unspecified, uncomplicated: Secondary | ICD-10-CM

## 2019-07-03 DIAGNOSIS — Z8249 Family history of ischemic heart disease and other diseases of the circulatory system: Secondary | ICD-10-CM | POA: Diagnosis not present

## 2019-07-03 MED ORDER — HYDRALAZINE HCL 25 MG PO TABS: 25.0000 mg | ORAL_TABLET | Freq: Three times a day (TID) | ORAL | 3 refills | Status: DC

## 2019-07-03 MED ORDER — SIMVASTATIN 20 MG PO TABS: 20.0000 mg | ORAL_TABLET | Freq: Every day | ORAL | 3 refills | Status: DC

## 2019-07-03 MED ORDER — AMLODIPINE BESYLATE 5 MG PO TABS: 5.0000 mg | ORAL_TABLET | Freq: Every day | ORAL | 3 refills | Status: DC

## 2019-07-03 MED ORDER — CARVEDILOL 25 MG PO TABS: 25.0000 mg | ORAL_TABLET | Freq: Two times a day (BID) | ORAL | 3 refills | Status: DC

## 2019-07-03 MED ORDER — POTASSIUM CHLORIDE CRYS ER 20 MEQ PO TBCR: 20.0000 meq | EXTENDED_RELEASE_TABLET | Freq: Every day | ORAL | 3 refills | Status: DC

## 2019-07-03 MED ORDER — FUROSEMIDE 40 MG PO TABS: 40.0000 mg | ORAL_TABLET | Freq: Every day | ORAL | 3 refills | Status: DC

## 2019-07-03 MED ORDER — ISOSORBIDE MONONITRATE ER 30 MG PO TB24: 30.0000 mg | ORAL_TABLET | Freq: Every day | ORAL | 3 refills | Status: DC

## 2019-07-03 NOTE — Patient Instructions (Signed)
Continue weighing daily and call for an overnight weight gain of > 2 pounds or a weekly weight gain of >5 pounds.  Increase lasix to 40mg  daily. You may take 2 pills of 20mg  until you finish the bottle.   Start Potassium 20 mEq daily.

## 2019-07-08 NOTE — Progress Notes (Signed)
Patient ID: Jose Dakins., male    DOB: 09-28-1971, 48 y.o.   MRN: [id number]  HPI  Jose [name] is a 48 y/o male with a history of hyperlipidemia, HTN, current tobacco/ alcohol use and chronic heart failure.   Echo report from 01/29/2019 reviewed and showed an EF of 60-65%,  Admitted 01/28/2019 due to acute on chronic HF. Cardiology consult obtained. Initially given IV lasix and then transitioned to oral diuretics. Discharged after 2 days.   He presents today for a follow-up visit with a chief complaint of minimal shortness of breath upon moderate exertion. He states his abdomen has improved greatly. He feels like a "different person" from last week. Denies leg edema, dizziness, lightheadedness, fatigue.   He received 80mg  IV lasix/ 55meq PO potassium x2 days last week.    Past Medical History:  Diagnosis Date  . Alcohol abuse   . Cervicalgia   . CHF (congestive heart failure) ([place])   . Hyperlipidemia   . Hypertension   . Seizures ([place])    Past Surgical History:  Procedure Laterality Date  . COLONOSCOPY WITH PROPOFOL N/A 01/16/2018   Procedure: COLONOSCOPY WITH PROPOFOL;  Surgeon: Lin Landsman, MD;  Location: [facility] ENDOSCOPY;  Service: Gastroenterology;  Laterality: N/A;  . ESOPHAGOGASTRODUODENOSCOPY (EGD) WITH PROPOFOL N/A 01/16/2018   Procedure: ESOPHAGOGASTRODUODENOSCOPY (EGD) WITH PROPOFOL;  Surgeon: Lin Landsman, MD;  Location: [facility] ENDOSCOPY;  Service: Gastroenterology;  Laterality: N/A;   Family History  Problem Relation Age of Onset  . Diabetes Mother   . Colon cancer Mother   . Hypertension Father   . Multiple sclerosis Father    Social History   Tobacco Use  . Smoking status: Current Every Day Smoker    Packs/day: 0.50  . Smokeless tobacco: Former Network engineer Use Topics  . Alcohol use: Yes    Alcohol/week: 28.0 standard drinks    Types: 7 Cans of beer, 21 Shots of liquor per week    Comment: half pint liquor per day   No Known Allergies  Prior to  Admission medications   Medication Sig Start Date End Date Taking? Authorizing Provider  amLODipine (NORVASC) 5 MG tablet Take 1 tablet (5 mg total) by mouth daily. 07/03/19  Yes Darylene Price A, FNP  carbamazepine (TEGRETOL-XR) 200 MG 12 hr tablet Take 1 tablet (200 mg total) by mouth 2 (two) times daily. 02/13/19  Yes Guadalupe Maple, MD  carvedilol (COREG) 25 MG tablet Take 1 tablet (25 mg total) by mouth 2 (two) times daily with a meal. 07/03/19  Yes Hackney, Tina A, FNP  furosemide (LASIX) 40 MG tablet Take 1 tablet (40 mg total) by mouth daily. 07/03/19 10/01/19 Yes Darylene Price A, FNP  hydrALAZINE (APRESOLINE) 25 MG tablet Take 1 tablet (25 mg total) by mouth 3 (three) times daily. 07/03/19  Yes Darylene Price A, FNP  isosorbide mononitrate (IMDUR) 30 MG 24 hr tablet Take 1 tablet (30 mg total) by mouth daily. 07/03/19  Yes Darylene Price A, FNP  potassium chloride SA (K-DUR) 20 MEQ tablet Take 1 tablet (20 mEq total) by mouth daily. 07/03/19  Yes Darylene Price A, FNP  simvastatin (ZOCOR) 20 MG tablet Take 1 tablet (20 mg total) by mouth daily. 07/03/19  Yes Alisa Graff, FNP            Review of Systems  Constitutional: Negative for appetite change and fatigue.  HENT: Positive for hearing loss. Negative for congestion and rhinorrhea.   Eyes: Negative.  Respiratory: Positive for shortness of breath (minimal with moderate exertion). Negative for cough and chest tightness.   Cardiovascular: Negative for chest pain, palpitations and leg swelling.  Gastrointestinal: Positive for abdominal distention ("improving"). Negative for abdominal pain, constipation, diarrhea and nausea.  Endocrine: Negative.   Genitourinary: Negative.   Musculoskeletal: Negative.   Skin: Negative.   Neurological: Negative for dizziness and light-headedness.  Hematological: Negative.   Psychiatric/Behavioral: Negative for dysphoric mood and sleep disturbance. The patient is not nervous/anxious.    Vitals:   07/10/19  1423  BP: (!) 156/83  Pulse: 80  Resp: 18  SpO2: 98%   Filed Weights   07/10/19 1423  Weight: 239 lb 8 oz (108.6 kg)   Lab Results  Component Value Date   CREATININE 0.58 (L) 07/02/2019   CREATININE 0.53 (L) 07/01/2019   CREATININE 0.83 02/08/2019   Physical Exam Vitals signs and nursing note reviewed.  Constitutional:      Appearance: Normal appearance.  HENT:     Head: Normocephalic and atraumatic.     Right Ear: Decreased hearing noted.     Left Ear: Decreased hearing noted.  Neck:     Musculoskeletal: Normal range of motion and neck supple.     Vascular: No JVD.  Cardiovascular:     Rate and Rhythm: Normal rate and regular rhythm.  Pulmonary:     Effort: Pulmonary effort is normal.     Breath sounds: Normal breath sounds. No wheezing, rhonchi or rales.  Abdominal:     General: There is distension.     Tenderness: There is no abdominal tenderness.     Comments: Much softer  Musculoskeletal:        General: No swelling or tenderness.  Skin:    General: Skin is warm and dry.  Neurological:     General: No focal deficit present.     Mental Status: He is alert and oriented to person, place, and time.  Psychiatric:        Mood and Affect: Mood normal.        Behavior: Behavior normal.     Assessment & Plan:  1. Chronic heart failure with preserved ejection fraction- - NYHA class II - euvolemic today -  encouraged him to resume daily weighing and call for an overnight weight gain of >2 pounds or a weekly weight gain of > 5 pounds - weight up 5 pounds from last visit one week ago - received 80mg  IV lasix/ 26meq PO potassium twice last week - will check BMP today - patient walked to the office today and stated not short of breath - not adding salt and is trying to follow a low sodium diet - BNP 07/02/2019 was 32.0  2: HTN- - BP elevated 142/86 manual - start lisinopril 5mg  daily - saw PCP (Crissman) 02/05/2019 - BMP from 07/02/2019 reviewed and showed sodium  142, potassium 3.4, creatinine 0.58 and GFR >60  3: Tobacco use-  - says that he's smoking ~ 1-2 cigars daily  - complete cessation discussed for 2 minutes with him  Patient did not bring his medications nor a list. Each medication was verbally reviewed with the patient and he was encouraged to bring the bottles to every visit to confirm accuracy of list.  Return in 4 weeks or sooner for any questions/problems before then.

## 2019-07-09 ENCOUNTER — Telehealth: Payer: Self-pay

## 2019-07-10 ENCOUNTER — Ambulatory Visit: Payer: Managed Care, Other (non HMO) | Attending: Family | Admitting: Family

## 2019-07-10 ENCOUNTER — Other Ambulatory Visit: Payer: Self-pay

## 2019-07-10 ENCOUNTER — Encounter: Payer: Self-pay | Admitting: Family

## 2019-07-10 VITALS — BP 156/83 | HR 80 | Resp 18 | Ht 70.0 in | Wt 239.5 lb

## 2019-07-10 DIAGNOSIS — E785 Hyperlipidemia, unspecified: Secondary | ICD-10-CM | POA: Diagnosis not present

## 2019-07-10 DIAGNOSIS — Z8249 Family history of ischemic heart disease and other diseases of the circulatory system: Secondary | ICD-10-CM | POA: Insufficient documentation

## 2019-07-10 DIAGNOSIS — F1011 Alcohol abuse, in remission: Secondary | ICD-10-CM | POA: Diagnosis not present

## 2019-07-10 DIAGNOSIS — I10 Essential (primary) hypertension: Secondary | ICD-10-CM

## 2019-07-10 DIAGNOSIS — I110 Hypertensive heart disease with heart failure: Secondary | ICD-10-CM | POA: Insufficient documentation

## 2019-07-10 DIAGNOSIS — Z800 Family history of malignant neoplasm of digestive organs: Secondary | ICD-10-CM | POA: Diagnosis not present

## 2019-07-10 DIAGNOSIS — F17210 Nicotine dependence, cigarettes, uncomplicated: Secondary | ICD-10-CM | POA: Diagnosis not present

## 2019-07-10 DIAGNOSIS — I5032 Chronic diastolic (congestive) heart failure: Secondary | ICD-10-CM

## 2019-07-10 DIAGNOSIS — R569 Unspecified convulsions: Secondary | ICD-10-CM | POA: Insufficient documentation

## 2019-07-10 DIAGNOSIS — Z79899 Other long term (current) drug therapy: Secondary | ICD-10-CM | POA: Insufficient documentation

## 2019-07-10 DIAGNOSIS — Z833 Family history of diabetes mellitus: Secondary | ICD-10-CM | POA: Insufficient documentation

## 2019-07-10 DIAGNOSIS — F17200 Nicotine dependence, unspecified, uncomplicated: Secondary | ICD-10-CM

## 2019-07-10 LAB — BASIC METABOLIC PANEL
Anion gap: 9 (ref 5–15)
BUN: 14 mg/dL (ref 6–20)
CO2: 25 mmol/L (ref 22–32)
Calcium: 9.1 mg/dL (ref 8.9–10.3)
Chloride: 107 mmol/L (ref 98–111)
Creatinine, Ser: 0.69 mg/dL (ref 0.61–1.24)
GFR calc Af Amer: >60 mL/min (ref >60)
GFR calc non Af Amer: >60 mL/min (ref >60)
Glucose, Bld: 95 mg/dL (ref 70–99)
Potassium: 3.8 mmol/L (ref 3.5–5.1)
Sodium: 141 mmol/L (ref 135–145)

## 2019-07-10 MED ORDER — LISINOPRIL 5 MG PO TABS: 5.0000 mg | ORAL_TABLET | Freq: Every day | ORAL | 3 refills | Status: DC

## 2019-07-10 NOTE — Patient Instructions (Addendum)
Continue weighing daily and call for an overnight weight gain of >2 pounds or a weekly weight gain of >5 pounds.  Start lisinopril 5mg  daily (1 tablet)

## 2019-08-07 ENCOUNTER — Other Ambulatory Visit: Payer: Self-pay

## 2019-08-07 ENCOUNTER — Ambulatory Visit: Payer: Managed Care, Other (non HMO) | Attending: Family | Admitting: Family

## 2019-08-07 ENCOUNTER — Encounter: Payer: Self-pay | Admitting: Family

## 2019-08-07 VITALS — BP 182/126 | HR 85 | Resp 18 | Ht 70.0 in | Wt 238.5 lb

## 2019-08-07 DIAGNOSIS — R569 Unspecified convulsions: Secondary | ICD-10-CM | POA: Diagnosis not present

## 2019-08-07 DIAGNOSIS — I5032 Chronic diastolic (congestive) heart failure: Secondary | ICD-10-CM | POA: Diagnosis not present

## 2019-08-07 DIAGNOSIS — F17210 Nicotine dependence, cigarettes, uncomplicated: Secondary | ICD-10-CM | POA: Diagnosis not present

## 2019-08-07 DIAGNOSIS — I10 Essential (primary) hypertension: Secondary | ICD-10-CM

## 2019-08-07 DIAGNOSIS — F17200 Nicotine dependence, unspecified, uncomplicated: Secondary | ICD-10-CM

## 2019-08-07 DIAGNOSIS — Z79899 Other long term (current) drug therapy: Secondary | ICD-10-CM | POA: Insufficient documentation

## 2019-08-07 DIAGNOSIS — E785 Hyperlipidemia, unspecified: Secondary | ICD-10-CM | POA: Insufficient documentation

## 2019-08-07 DIAGNOSIS — I110 Hypertensive heart disease with heart failure: Secondary | ICD-10-CM | POA: Diagnosis not present

## 2019-08-07 DIAGNOSIS — Z8249 Family history of ischemic heart disease and other diseases of the circulatory system: Secondary | ICD-10-CM | POA: Insufficient documentation

## 2019-08-07 NOTE — Progress Notes (Signed)
Patient ID: Jose Dakins., male    DOB: Jan 13, 1971, 48 y.o.   MRN: [id number]  Jose [name] is a 48 y/o male with a history of hyperlipidemia, HTN, current tobacco/ alcohol use and chronic heart failure.   Echo report from 01/29/2019 reviewed and showed an EF of 60-65%,  Admitted 01/28/2019 due to acute on chronic HF. Cardiology consult obtained. Initially given IV lasix and then transitioned to oral diuretics. Discharged after 2 days.   He presents today for a follow-up visit with a chief complaint of shortness of breath with moderate exertion. He states this has been chronic in nature. He denies fatigue, chest pain, leg swelling, palpitations, abdominal distention, dizziness, light-headedness and trouble sleeping. He weighs himself every day and fluctuates between 1-2 pounds. He states he ran out of medicine on Monday and has not taken any medications since then. He also said that he forgot to pick up his lisinopril from the pharmacy that was started last visit.    Past Medical History:  Diagnosis Date  . Alcohol abuse   . Cervicalgia   . CHF (congestive heart failure) ([place])   . Hyperlipidemia   . Hypertension   . Seizures ([place])    Past Surgical History:  Procedure Laterality Date  . COLONOSCOPY WITH PROPOFOL N/A 01/16/2018   Procedure: COLONOSCOPY WITH PROPOFOL;  Surgeon: Lin Landsman, MD;  Location: [facility] ENDOSCOPY;  Service: Gastroenterology;  Laterality: N/A;  . ESOPHAGOGASTRODUODENOSCOPY (EGD) WITH PROPOFOL N/A 01/16/2018   Procedure: ESOPHAGOGASTRODUODENOSCOPY (EGD) WITH PROPOFOL;  Surgeon: Lin Landsman, MD;  Location: [facility] ENDOSCOPY;  Service: Gastroenterology;  Laterality: N/A;   Family History  Problem Relation Age of Onset  . Diabetes Mother   . Colon cancer Mother   . Hypertension Father   . Multiple sclerosis Father    Social History   Tobacco Use  . Smoking status: Current Every Day Smoker    Packs/day: 0.50    Types: Cigars  . Smokeless tobacco: Former Systems developer   . Tobacco comment: 2 cigars a day  Substance Use Topics  . Alcohol use: Yes    Alcohol/week: 28.0 standard drinks    Types: 7 Cans of beer, 21 Shots of liquor per week    Comment: half pint liquor per day   No Known Allergies  Prior to Admission medications   Medication Sig Start Date End Date Taking? Authorizing Provider  amLODipine (NORVASC) 5 MG tablet Take 1 tablet (5 mg total) by mouth daily. Patient not taking: Reported on 08/07/2019 07/03/19   Alisa Graff, FNP  carbamazepine (TEGRETOL-XR) 200 MG 12 hr tablet Take 1 tablet (200 mg total) by mouth 2 (two) times daily. Patient not taking: Reported on 08/07/2019 02/13/19   Guadalupe Maple, MD  carvedilol (COREG) 25 MG tablet Take 1 tablet (25 mg total) by mouth 2 (two) times daily with a meal. Patient not taking: Reported on 08/07/2019 07/03/19   Alisa Graff, FNP  furosemide (LASIX) 40 MG tablet Take 1 tablet (40 mg total) by mouth daily. Patient not taking: Reported on 08/07/2019 07/03/19 10/01/19  Alisa Graff, FNP  hydrALAZINE (APRESOLINE) 25 MG tablet Take 1 tablet (25 mg total) by mouth 3 (three) times daily. Patient not taking: Reported on 08/07/2019 07/03/19   Darylene Price A, FNP  isosorbide mononitrate (IMDUR) 30 MG 24 hr tablet Take 1 tablet (30 mg total) by mouth daily. Patient not taking: Reported on 08/07/2019 07/03/19   Darylene Price A, FNP  lisinopril (ZESTRIL) 5 MG  tablet Take 1 tablet (5 mg total) by mouth daily. Patient not taking: Reported on 08/07/2019 07/10/19 10/08/19  Darylene Price A, FNP  potassium chloride SA (K-DUR) 20 MEQ tablet Take 1 tablet (20 mEq total) by mouth daily. Patient not taking: Reported on 08/07/2019 07/03/19   Alisa Graff, FNP  simvastatin (ZOCOR) 20 MG tablet Take 1 tablet (20 mg total) by mouth daily. Patient not taking: Reported on 08/07/2019 07/03/19   Alisa Graff, FNP    Review of Systems  Constitutional: Negative for appetite change and fatigue.  HENT: Positive for hearing loss.  Negative for congestion and rhinorrhea.   Eyes: Negative.   Respiratory: Positive for shortness of breath (with moderate exertion). Negative for cough and chest tightness.   Cardiovascular: Negative for chest pain, palpitations and leg swelling.  Gastrointestinal: Negative for abdominal distention, abdominal pain, constipation, diarrhea and nausea.  Endocrine: Negative.   Genitourinary: Negative.   Musculoskeletal: Negative.   Skin: Negative.   Neurological: Negative for dizziness and light-headedness.  Hematological: Negative.   Psychiatric/Behavioral: Negative for dysphoric mood and sleep disturbance. The patient is not nervous/anxious.    Vitals:   08/07/19 1427  BP: (!) 182/126  Pulse: 85  Resp: 18  SpO2: 97%   Filed Weights   08/07/19 1427  Weight: 238 lb 8 oz (108.2 kg)   Lab Results  Component Value Date   CREATININE 0.69 07/10/2019   CREATININE 0.58 (L) 07/02/2019   CREATININE 0.53 (L) 07/01/2019   Physical Exam Vitals signs and nursing note reviewed.  Constitutional:      Appearance: Normal appearance.  HENT:     Head: Normocephalic and atraumatic.     Right Ear: Decreased hearing noted.     Left Ear: Decreased hearing noted.  Neck:     Musculoskeletal: Normal range of motion and neck supple.     Vascular: No JVD.  Cardiovascular:     Rate and Rhythm: Normal rate and regular rhythm.  Pulmonary:     Effort: Pulmonary effort is normal.     Breath sounds: Normal breath sounds. No wheezing, rhonchi or rales.  Abdominal:     General: There is distension (mild).     Tenderness: There is no abdominal tenderness.     Comments: Much softer  Musculoskeletal:        General: No swelling or tenderness.  Skin:    General: Skin is warm and dry.  Neurological:     General: No focal deficit present.     Mental Status: He is alert and oriented to person, place, and time.  Psychiatric:        Mood and Affect: Mood normal.        Behavior: Behavior normal.      Assessment & Plan:  1. Chronic heart failure with preserved ejection fraction- - NYHA class II - euvolemic today -  encouraged him to resume daily weighing and call for an overnight weight gain of >2 pounds or a weekly weight gain of > 5 pounds - weight stable from last visit 1 month ago - patient walked to the office today and stated not short of breath - not adding salt and is trying to follow a low sodium diet - BNP 07/02/2019 was 32.0  2: HTN- - BP elevated 178/110 manual - Patient had medications with 3 refills sent last visit 1 month ago. Educated patient that he needs to call the pharmacy when he needs refills on his prescriptions. He stated he will pick them  up today. - will check BMP next visit since he is starting lisinopril today - saw PCP (Crissman) 02/05/2019 - BMP from 07/10/2019 reviewed and showed sodium 141, potassium 3.8, creatinine 0.69 and GFR >60  3: Tobacco use-  - says that he's smoking ~ 1-2 cigars daily  - complete cessation discussed for 2 minutes with him  Patient did not bring his medications nor a list. Each medication was verbally reviewed with the patient and he was encouraged to bring the bottles to every visit to confirm accuracy of list.  Return in 4 weeks or sooner for any questions/problems before then.

## 2019-08-07 NOTE — Patient Instructions (Addendum)
Continue weighing daily and call for an overnight weight gain of >2 pounds or a weekly weight gain of >5 pounds.  Mrs. Dash for seasoning foods.  Call or stop by the pharmacy to pick up medications.

## 2019-08-09 ENCOUNTER — Telehealth: Payer: Self-pay

## 2019-08-21 ENCOUNTER — Telehealth: Payer: Self-pay | Admitting: Pharmacy Technician

## 2019-08-21 NOTE — Telephone Encounter (Signed)
Patient's mother came into [facility] and stated that patient had requested that she notify [facility] that he currently has prescription drug coverage through his employer.  Asked that his prescriptions be transferred to [facility] on [address], [place].  Zoila Shutter to send prescriptions to [facility].  [facility]

## 2019-08-28 ENCOUNTER — Telehealth: Payer: Self-pay

## 2019-08-28 NOTE — Progress Notes (Signed)
Patient ID: Jose Dakins., male    DOB: Jul 21, 1971, 48 y.o.   MRN: [id number]  Jose [name] is a 48 y/o male with a history of hyperlipidemia, HTN, current tobacco/ alcohol use and chronic heart failure.   Echo report from 01/29/2019 reviewed and showed an EF of 60-65%,  Admitted 01/28/2019 due to acute on chronic HF. Cardiology consult obtained. Initially given IV lasix and then transitioned to oral diuretics. Discharged after 2 days.   He presents today for a follow-up visit with a chief complaint of shortness of breath with moderate exertion and intermittent fatigue. He denies chest pain, leg swelling, palpitations, abdominal distention, dizziness, and trouble sleeping. He has been weight himself every day and has cut back on his salt. He is looking at the nutrition labels for sodium content. He has started his lisinopril and not noted any side effects with this medication.   Past Medical History:  Diagnosis Date  . Alcohol abuse   . Cervicalgia   . CHF (congestive heart failure) ([place])   . Hyperlipidemia   . Hypertension   . Seizures ([place])    Past Surgical History:  Procedure Laterality Date  . COLONOSCOPY WITH PROPOFOL N/A 01/16/2018   Procedure: COLONOSCOPY WITH PROPOFOL;  Surgeon: Lin Landsman, MD;  Location: [facility] ENDOSCOPY;  Service: Gastroenterology;  Laterality: N/A;  . ESOPHAGOGASTRODUODENOSCOPY (EGD) WITH PROPOFOL N/A 01/16/2018   Procedure: ESOPHAGOGASTRODUODENOSCOPY (EGD) WITH PROPOFOL;  Surgeon: Lin Landsman, MD;  Location: [facility] ENDOSCOPY;  Service: Gastroenterology;  Laterality: N/A;   Family History  Problem Relation Age of Onset  . Diabetes Mother   . Colon cancer Mother   . Hypertension Father   . Multiple sclerosis Father    Social History   Tobacco Use  . Smoking status: Current Every Day Smoker    Packs/day: 0.50    Types: Cigars  . Smokeless tobacco: Former Systems developer  . Tobacco comment: 2 cigars a day  Substance Use Topics  . Alcohol use: Yes     Alcohol/week: 28.0 standard drinks    Types: 7 Cans of beer, 21 Shots of liquor per week    Comment: half pint liquor per day   No Known Allergies  Prior to Admission medications   Medication Sig Start Date End Date Taking? Authorizing Provider  amLODipine (NORVASC) 5 MG tablet Take 1 tablet (5 mg total) by mouth daily. 07/03/19  Yes Darylene Price A, FNP  carbamazepine (TEGRETOL-XR) 200 MG 12 hr tablet Take 1 tablet (200 mg total) by mouth 2 (two) times daily. 02/13/19  Yes Guadalupe Maple, MD  carvedilol (COREG) 25 MG tablet Take 1 tablet (25 mg total) by mouth 2 (two) times daily with a meal. 07/03/19  Yes Hackney, Tina A, FNP  furosemide (LASIX) 40 MG tablet Take 1 tablet (40 mg total) by mouth daily. 07/03/19 10/01/19 Yes Darylene Price A, FNP  hydrALAZINE (APRESOLINE) 25 MG tablet Take 1 tablet (25 mg total) by mouth 3 (three) times daily. 07/03/19  Yes Darylene Price A, FNP  isosorbide mononitrate (IMDUR) 30 MG 24 hr tablet Take 1 tablet (30 mg total) by mouth daily. 07/03/19  Yes Hackney, Otila Kluver A, FNP  lisinopril (ZESTRIL) 5 MG tablet Take 1 tablet (5 mg total) by mouth daily. 07/10/19 10/08/19 Yes Hackney, Otila Kluver A, FNP  potassium chloride SA (K-DUR) 20 MEQ tablet Take 1 tablet (20 mEq total) by mouth daily. 07/03/19  Yes Darylene Price A, FNP  simvastatin (ZOCOR) 20 MG tablet Take 1 tablet (20 mg  total) by mouth daily. 07/03/19  Yes Alisa Graff, FNP    Review of Systems  Constitutional: Positive for fatigue (intermittent). Negative for appetite change.  HENT: Positive for hearing loss. Negative for congestion and rhinorrhea.   Eyes: Negative.   Respiratory: Positive for shortness of breath (with moderate exertion). Negative for cough and chest tightness.   Cardiovascular: Negative for chest pain, palpitations and leg swelling.  Gastrointestinal: Negative for abdominal distention, abdominal pain, constipation, diarrhea and nausea.  Endocrine: Negative.   Genitourinary: Negative.    Musculoskeletal: Negative.   Skin: Negative.   Neurological: Negative for dizziness and light-headedness.  Hematological: Negative.   Psychiatric/Behavioral: Negative for dysphoric mood and sleep disturbance. The patient is not nervous/anxious.    Vitals:   09/02/19 0854  BP: (!) 154/98  Pulse: 83  Resp: (!) 22  SpO2: 97%   Filed Weights   09/02/19 0854  Weight: 242 lb 3.2 oz (109.9 kg)   Lab Results  Component Value Date   CREATININE 0.61 09/02/2019   CREATININE 0.69 07/10/2019   CREATININE 0.58 (L) 07/02/2019    Physical Exam Vitals signs and nursing note reviewed.  Constitutional:      Appearance: Normal appearance.  HENT:     Head: Normocephalic and atraumatic.     Right Ear: Decreased hearing noted.     Left Ear: Decreased hearing noted.  Neck:     Musculoskeletal: Normal range of motion and neck supple.     Vascular: No JVD.  Cardiovascular:     Rate and Rhythm: Normal rate and regular rhythm.  Pulmonary:     Effort: Pulmonary effort is normal.     Breath sounds: Normal breath sounds. No wheezing, rhonchi or rales.  Abdominal:     General: There is distension (mild).     Tenderness: There is no abdominal tenderness.     Comments: Much softer  Musculoskeletal:        General: No swelling or tenderness.  Skin:    General: Skin is warm and dry.  Neurological:     General: No focal deficit present.     Mental Status: He is alert and oriented to person, place, and time.  Psychiatric:        Mood and Affect: Mood normal.        Behavior: Behavior normal.     Assessment & Plan:  1. Chronic heart failure with preserved ejection fraction- - NYHA class II - euvolemic today -  Weighing daily and reminded to call for an overnight weight gain of >2 pounds or a weekly weight gain of > 5 pounds - weight up 4 pounds from last visit 1 month ago - patient walked to the office today and stated not short of breath - not adding salt and is trying to follow a low  sodium diet - BNP 07/02/2019 was 32.0 - received his flu vaccine this season  2: HTN- - BP still slightly elevated, although much better than last visit - Increase lisinopril to 10mg  daily. He may take 2 tablets of his current bottle daily and he will call the office when he is running low for a refill. With the new prescription he will go back to taking 1 pill daily.  - will check BMP today since starting lisinopril - saw PCP (Crissman) 02/05/2019 - BMP from 07/10/2019 reviewed and showed sodium 141, potassium 3.8, creatinine 0.69 and GFR >60  3: Tobacco use-  - says that he's smoking ~ 1-2 cigars daily  -  complete cessation discussed for 2 minutes with him  Patient did not bring his medications nor a list. Each medication was verbally reviewed with the patient and he was encouraged to bring the bottles to every visit to confirm accuracy of list.  Return in 4 weeks or sooner for any questions/problems before then.

## 2019-09-02 ENCOUNTER — Encounter: Payer: Self-pay | Admitting: Family

## 2019-09-02 ENCOUNTER — Other Ambulatory Visit: Payer: Self-pay

## 2019-09-02 ENCOUNTER — Ambulatory Visit: Payer: 59 | Attending: Family | Admitting: Family

## 2019-09-02 VITALS — BP 154/98 | HR 83 | Resp 22 | Ht 70.0 in | Wt 242.2 lb

## 2019-09-02 DIAGNOSIS — Z79899 Other long term (current) drug therapy: Secondary | ICD-10-CM | POA: Diagnosis not present

## 2019-09-02 DIAGNOSIS — F17210 Nicotine dependence, cigarettes, uncomplicated: Secondary | ICD-10-CM | POA: Diagnosis not present

## 2019-09-02 DIAGNOSIS — Z8249 Family history of ischemic heart disease and other diseases of the circulatory system: Secondary | ICD-10-CM | POA: Insufficient documentation

## 2019-09-02 DIAGNOSIS — I5032 Chronic diastolic (congestive) heart failure: Secondary | ICD-10-CM | POA: Insufficient documentation

## 2019-09-02 DIAGNOSIS — Z7901 Long term (current) use of anticoagulants: Secondary | ICD-10-CM | POA: Diagnosis not present

## 2019-09-02 DIAGNOSIS — I110 Hypertensive heart disease with heart failure: Secondary | ICD-10-CM | POA: Diagnosis not present

## 2019-09-02 DIAGNOSIS — E785 Hyperlipidemia, unspecified: Secondary | ICD-10-CM | POA: Diagnosis not present

## 2019-09-02 DIAGNOSIS — R0602 Shortness of breath: Secondary | ICD-10-CM | POA: Diagnosis present

## 2019-09-02 DIAGNOSIS — F17200 Nicotine dependence, unspecified, uncomplicated: Secondary | ICD-10-CM

## 2019-09-02 DIAGNOSIS — I10 Essential (primary) hypertension: Secondary | ICD-10-CM

## 2019-09-02 LAB — BASIC METABOLIC PANEL
Anion gap: 9 (ref 5–15)
BUN: 16 mg/dL (ref 6–20)
CO2: 27 mmol/L (ref 22–32)
Calcium: 8.6 mg/dL — ABNORMAL LOW (ref 8.9–10.3)
Chloride: 105 mmol/L (ref 98–111)
Creatinine, Ser: 0.61 mg/dL (ref 0.61–1.24)
GFR calc Af Amer: >60 mL/min (ref >60)
GFR calc non Af Amer: >60 mL/min (ref >60)
Glucose, Bld: 90 mg/dL (ref 70–99)
Potassium: 3.9 mmol/L (ref 3.5–5.1)
Sodium: 141 mmol/L (ref 135–145)

## 2019-09-02 NOTE — Patient Instructions (Addendum)
Continue weighing daily and call for an overnight weight gain of >2 pounds or a weekly weight gain of >5 pounds.  Increase lisinopril to 10mg  daily. You may take 2 pills once a day of your current bottle until you run out. Call our office when you are almost out of your pills. With your new bottle take 1 pill daily.

## 2019-09-04 ENCOUNTER — Ambulatory Visit: Payer: Managed Care, Other (non HMO) | Admitting: Family

## 2019-09-27 ENCOUNTER — Encounter: Payer: Self-pay | Admitting: Family

## 2019-09-27 ENCOUNTER — Telehealth: Payer: Self-pay | Admitting: Family

## 2019-09-27 ENCOUNTER — Other Ambulatory Visit: Payer: Self-pay

## 2019-09-27 ENCOUNTER — Ambulatory Visit: Payer: 59 | Attending: Family | Admitting: Family

## 2019-09-27 VITALS — BP 160/100 | HR 76 | Resp 18 | Ht 70.0 in | Wt 239.4 lb

## 2019-09-27 DIAGNOSIS — I110 Hypertensive heart disease with heart failure: Secondary | ICD-10-CM | POA: Insufficient documentation

## 2019-09-27 DIAGNOSIS — Z79899 Other long term (current) drug therapy: Secondary | ICD-10-CM | POA: Insufficient documentation

## 2019-09-27 DIAGNOSIS — Z833 Family history of diabetes mellitus: Secondary | ICD-10-CM | POA: Diagnosis not present

## 2019-09-27 DIAGNOSIS — Z800 Family history of malignant neoplasm of digestive organs: Secondary | ICD-10-CM | POA: Diagnosis not present

## 2019-09-27 DIAGNOSIS — F17290 Nicotine dependence, other tobacco product, uncomplicated: Secondary | ICD-10-CM | POA: Diagnosis not present

## 2019-09-27 DIAGNOSIS — I10 Essential (primary) hypertension: Secondary | ICD-10-CM

## 2019-09-27 DIAGNOSIS — E785 Hyperlipidemia, unspecified: Secondary | ICD-10-CM | POA: Insufficient documentation

## 2019-09-27 DIAGNOSIS — I5032 Chronic diastolic (congestive) heart failure: Secondary | ICD-10-CM | POA: Insufficient documentation

## 2019-09-27 DIAGNOSIS — I509 Heart failure, unspecified: Secondary | ICD-10-CM | POA: Diagnosis present

## 2019-09-27 DIAGNOSIS — Z8249 Family history of ischemic heart disease and other diseases of the circulatory system: Secondary | ICD-10-CM | POA: Diagnosis not present

## 2019-09-27 DIAGNOSIS — F17200 Nicotine dependence, unspecified, uncomplicated: Secondary | ICD-10-CM

## 2019-09-27 LAB — BASIC METABOLIC PANEL
Anion gap: 9 (ref 5–15)
BUN: 15 mg/dL (ref 6–20)
CO2: 28 mmol/L (ref 22–32)
Calcium: 8.8 mg/dL — ABNORMAL LOW (ref 8.9–10.3)
Chloride: 104 mmol/L (ref 98–111)
Creatinine, Ser: 0.56 mg/dL — ABNORMAL LOW (ref 0.61–1.24)
GFR calc Af Amer: >60 mL/min (ref >60)
GFR calc non Af Amer: >60 mL/min (ref >60)
Glucose, Bld: 92 mg/dL (ref 70–99)
Potassium: 4.4 mmol/L (ref 3.5–5.1)
Sodium: 141 mmol/L (ref 135–145)

## 2019-09-27 MED ORDER — LISINOPRIL 10 MG PO TABS: 10.0000 mg | ORAL_TABLET | Freq: Every day | ORAL | 5 refills | Status: DC

## 2019-09-27 NOTE — Progress Notes (Signed)
Patient ID: Jose Dakins., male    DOB: Nov 10, 1970, 48 y.o.   MRN: [id number]  Jose [name] is a 48 y/o male with a history of hyperlipidemia, HTN, current tobacco/ alcohol use and chronic heart failure.   Echo report from 01/29/2019 reviewed and showed an EF of 60-65%,  Admitted 01/28/2019 due to acute on chronic HF. Cardiology consult obtained. Initially given IV lasix and then transitioned to oral diuretics. Discharged after 2 days.   He presents today for a follow-up visit with a chief complaint of minimal shortness of breath upon moderate exertion. He describes this as chronic in nature having been present for several years. He has associated fatigue along with this. He denies any difficulty sleeping, dizziness, abdominal distention, palpitations, pedal edema, chest pain, cough or weight gain.   Did eat "2 big bowls of homemade chili" last night. Didn't bring medications and isn't entirely sure of what he's taking. Says that he may be taking his lisinopril incorrectly but he can't remember.   Past Medical History:  Diagnosis Date  . Alcohol abuse   . Cervicalgia   . CHF (congestive heart failure) ([place])   . Hyperlipidemia   . Hypertension   . Seizures ([place])    Past Surgical History:  Procedure Laterality Date  . COLONOSCOPY WITH PROPOFOL N/A 01/16/2018   Procedure: COLONOSCOPY WITH PROPOFOL;  Surgeon: Lin Landsman, MD;  Location: [facility] ENDOSCOPY;  Service: Gastroenterology;  Laterality: N/A;  . ESOPHAGOGASTRODUODENOSCOPY (EGD) WITH PROPOFOL N/A 01/16/2018   Procedure: ESOPHAGOGASTRODUODENOSCOPY (EGD) WITH PROPOFOL;  Surgeon: Lin Landsman, MD;  Location: [facility] ENDOSCOPY;  Service: Gastroenterology;  Laterality: N/A;   Family History  Problem Relation Age of Onset  . Diabetes Mother   . Colon cancer Mother   . Hypertension Father   . Multiple sclerosis Father    Social History   Tobacco Use  . Smoking status: Current Every Day Smoker    Packs/day: 0.50    Types: Cigars   . Smokeless tobacco: Former Systems developer  . Tobacco comment: 2 cigars a day  Substance Use Topics  . Alcohol use: Yes    Alcohol/week: 28.0 standard drinks    Types: 7 Cans of beer, 21 Shots of liquor per week    Comment: half pint liquor per day   No Known Allergies  Prior to Admission medications   Medication Sig Start Date End Date Taking? Authorizing Provider  amLODipine (NORVASC) 5 MG tablet Take 1 tablet (5 mg total) by mouth daily. 07/03/19  Yes Darylene Price A, FNP  carbamazepine (TEGRETOL-XR) 200 MG 12 hr tablet Take 1 tablet (200 mg total) by mouth 2 (two) times daily. 02/13/19  Yes Guadalupe Maple, MD  carvedilol (COREG) 25 MG tablet Take 1 tablet (25 mg total) by mouth 2 (two) times daily with a meal. 07/03/19  Yes Hackney, Tina A, FNP  furosemide (LASIX) 40 MG tablet Take 1 tablet (40 mg total) by mouth daily. 07/03/19 10/01/19 Yes Darylene Price A, FNP  hydrALAZINE (APRESOLINE) 25 MG tablet Take 1 tablet (25 mg total) by mouth 3 (three) times daily. 07/03/19  Yes Darylene Price A, FNP  isosorbide mononitrate (IMDUR) 30 MG 24 hr tablet Take 1 tablet (30 mg total) by mouth daily. 07/03/19  Yes Hackney, Otila Kluver A, FNP  lisinopril (ZESTRIL) 5 MG tablet Take 1 tablet (5 mg total) by mouth daily. Patient taking differently: Take 10 mg by mouth daily.  07/10/19 10/08/19 Yes Darylene Price A, FNP  potassium chloride SA (K-DUR)  20 MEQ tablet Take 1 tablet (20 mEq total) by mouth daily. 07/03/19  Yes Darylene Price A, FNP  simvastatin (ZOCOR) 20 MG tablet Take 1 tablet (20 mg total) by mouth daily. 07/03/19  Yes Alisa Graff, FNP     Review of Systems  Constitutional: Positive for fatigue (intermittent). Negative for appetite change.  HENT: Positive for hearing loss. Negative for congestion and rhinorrhea.   Eyes: Negative.   Respiratory: Positive for shortness of breath (with moderate exertion). Negative for cough and chest tightness.   Cardiovascular: Negative for chest pain, palpitations and leg  swelling.  Gastrointestinal: Negative for abdominal distention and abdominal pain.  Endocrine: Negative.   Genitourinary: Negative.   Musculoskeletal: Negative.   Skin: Negative.   Neurological: Negative for dizziness and light-headedness.  Hematological: Negative.   Psychiatric/Behavioral: Negative for dysphoric mood and sleep disturbance. The patient is not nervous/anxious.    Vitals:   09/27/19 0827 09/27/19 0828  BP: (!) 166/111 (!) 160/100  Pulse: 76   Resp: 18   SpO2: 97%   Weight: 239 lb 6.4 oz (108.6 kg)   Height: 5\' 10"  (1.778 m)    Wt Readings from Last 3 Encounters:  09/27/19 239 lb 6.4 oz (108.6 kg)  09/02/19 242 lb 3.2 oz (109.9 kg)  08/07/19 238 lb 8 oz (108.2 kg)   Lab Results  Component Value Date   CREATININE 0.61 09/02/2019   CREATININE 0.69 07/10/2019   CREATININE 0.58 (L) 07/02/2019     Physical Exam Vitals signs and nursing note reviewed.  Constitutional:      Appearance: Normal appearance.  HENT:     Head: Normocephalic and atraumatic.     Right Ear: Decreased hearing noted.     Left Ear: Decreased hearing noted.  Neck:     Musculoskeletal: Normal range of motion and neck supple.     Vascular: No JVD.  Cardiovascular:     Rate and Rhythm: Normal rate and regular rhythm.  Pulmonary:     Effort: Pulmonary effort is normal.     Breath sounds: Rhonchi (RLL & RUL that cleared when coughed) present. No wheezing or rales.  Abdominal:     General: There is no distension.     Palpations: Abdomen is soft.     Tenderness: There is no abdominal tenderness.  Musculoskeletal:        General: No swelling or tenderness.  Skin:    General: Skin is warm and dry.  Neurological:     General: No focal deficit present.     Mental Status: He is alert and oriented to person, place, and time.  Psychiatric:        Mood and Affect: Mood normal.        Behavior: Behavior normal.     Assessment & Plan:  1. Chronic heart failure with preserved ejection  fraction- - NYHA class II - euvolemic today -  Weighing daily and reminded to call for an overnight weight gain of >2 pounds or a weekly weight gain of > 5 pounds - weight down 3 pounds from last visit ~1 month ago - patient walked to the office today and was a little short of breath because he said that he was rushing trying to get here on time - not adding salt and is trying to follow a low sodium diet - BNP 07/02/2019 was 32.0 - received his flu vaccine this season  2: HTN- - BP initially elevated (166/111) and was still elevated upon recheck with a  manual cuff (160/100) - lisinopril increased to 10mg  daily at last visit although patient is unclear if he's been taking it at that dose or not - instructed patient to call us back today once he returns home to go over all his medication bottles; emphasized the importance of bringing his bottles to every visit - will need to increase something but until I know for sure how he's taking everything, will hold off until he calls back - will check BMP today  - saw PCP (Crissman) 02/05/2019 & he says that he has to make an appointment - BMP from 09/02/2019 reviewed and showed sodium 141, potassium 3.9, creatinine 0.61 and GFR >60  3: Tobacco use-  - says that he's smoking ~ 1-2 cigars daily  - complete cessation discussed for 2 minutes with him  Patient did not bring his medications nor a list. Each medication was verbally reviewed with the patient and he was encouraged to bring the bottles to every visit to confirm accuracy of list.  Return in 1 month or sooner for any questions/problems before then.

## 2019-09-27 NOTE — Telephone Encounter (Signed)
Patient called to update his medication list as he didn't bring his medication bottles to his appointment this morning. Everything that was listed was correct except that he isn't taking any potassium and he's only taking lisinopril 5mg  daily (this was supposed to have been increased at his last visit).  Advised him that we will increase his lisinopril to 10mg  daily. He can take 2 of his current dose until they are gone but I will send in a new RX for the 10mg  dose and when he picks it up, he will take 1 tablet daily. Patient verbalized understanding.   Depending on lab results, he may need potassium supplementation.

## 2019-09-27 NOTE — Patient Instructions (Addendum)
Continue weighing daily and call for an overnight weight gain of > 2 pounds or a weekly weight gain of >5 pounds.  Call Dr. Rance Muir office to schedule an appointment.  Call back when you get home to go over all your medications and bring your bottles to every visit.

## 2019-10-18 ENCOUNTER — Ambulatory Visit: Payer: 59 | Admitting: Family Medicine

## 2019-10-18 ENCOUNTER — Other Ambulatory Visit: Payer: Self-pay

## 2019-10-18 ENCOUNTER — Encounter: Payer: Self-pay | Admitting: Family Medicine

## 2019-10-18 VITALS — BP 121/77 | HR 72 | Temp 98.4°F | Ht 70.0 in | Wt 232.0 lb

## 2019-10-18 DIAGNOSIS — E782 Mixed hyperlipidemia: Secondary | ICD-10-CM | POA: Diagnosis not present

## 2019-10-18 DIAGNOSIS — G40909 Epilepsy, unspecified, not intractable, without status epilepticus: Secondary | ICD-10-CM

## 2019-10-18 DIAGNOSIS — I10 Essential (primary) hypertension: Secondary | ICD-10-CM

## 2019-10-18 DIAGNOSIS — J45909 Unspecified asthma, uncomplicated: Secondary | ICD-10-CM | POA: Diagnosis not present

## 2019-10-18 MED ORDER — POTASSIUM CHLORIDE CRYS ER 20 MEQ PO TBCR: 20.0000 meq | EXTENDED_RELEASE_TABLET | Freq: Every day | ORAL | 1 refills | Status: DC

## 2019-10-18 MED ORDER — CARBAMAZEPINE ER 200 MG PO TB12: 200.0000 mg | ORAL_TABLET | Freq: Two times a day (BID) | ORAL | 1 refills | Status: DC

## 2019-10-18 NOTE — Progress Notes (Signed)
BP 121/77   Pulse 72   Temp 98.4 F (36.9 C) (Oral)   Ht 5\' 10"  (1.778 m)   Wt 232 lb (105.2 kg)   SpO2 94%   BMI 33.29 kg/m    Subjective:    Patient ID: Jose [name]., male    DOB: 05-Jan-1971, 48 y.o.   MRN: [id number]  HPI: Jarard Matteson. is a 48 y.o. male  Chief Complaint  Patient presents with  . Follow-up    pt states that his heast failure dr.asked him to f/u with PCP   Patient presenting today for chronic condition f/u.   Followed closely by HF Clinic for his diastolic HF. Denies SOB, CP, leg edema. Taking his medications faithfully. BPs have been stable when he's checked, which is infrequently. Typically 120-130s/70s when checked.   Still having episodes of seizure like activity every few months, thinks maybe it's days he's forgetting his tegretol but unsure exactly. When asked, states he's not seen a Neurologist in many years and has been on this dose for as long.   HLD - On simvastatin. Denies claudication, myalgias.   Asthma - stable and under good control without inhalers. No wheezing, SOB, CP, cough.   Relevant past medical, surgical, family and social history reviewed and updated as indicated. Interim medical history since our last visit reviewed. Allergies and medications reviewed and updated.  Review of Systems  Per HPI unless specifically indicated above     Objective:    BP 121/77   Pulse 72   Temp 98.4 F (36.9 C) (Oral)   Ht 5\' 10"  (1.778 m)   Wt 232 lb (105.2 kg)   SpO2 94%   BMI 33.29 kg/m   Wt Readings from Last 3 Encounters:  10/18/19 232 lb (105.2 kg)  09/27/19 239 lb 6.4 oz (108.6 kg)  09/02/19 242 lb 3.2 oz (109.9 kg)    Physical Exam Vitals and nursing note reviewed.  Constitutional:      Appearance: Normal appearance.  HENT:     Head: Atraumatic.  Eyes:     Extraocular Movements: Extraocular movements intact.     Conjunctiva/sclera: Conjunctivae normal.  Cardiovascular:     Rate and Rhythm: Normal rate and regular  rhythm.  Pulmonary:     Effort: Pulmonary effort is normal.     Breath sounds: Normal breath sounds.  Musculoskeletal:        General: Normal range of motion.     Cervical back: Normal range of motion and neck supple.  Skin:    General: Skin is warm and dry.  Neurological:     General: No focal deficit present.     Mental Status: He is oriented to person, place, and time.  Psychiatric:        Mood and Affect: Mood normal.        Thought Content: Thought content normal.        Judgment: Judgment normal.     Results for orders placed or performed in visit on 10/18/19  Lipid Panel w/o Chol/HDL Ratio out  Result Value Ref Range   Cholesterol, Total 157 100 - 199 mg/dL   Triglycerides 78 0 - 149 mg/dL   HDL 46 >39 mg/dL   VLDL Cholesterol Cal 15 5 - 40 mg/dL   LDL Chol Calc (NIH) 96 0 - 99 mg/dL  Carbamazepine Level (Tegretol), total  Result Value Ref Range   Carbamazepine (Tegretol), S 6.3 4.0 - 12.0 ug/mL  Assessment & Plan:   Problem List Items Addressed This Visit      Cardiovascular and Mediastinum   Hypertension - Primary    BPs stable and WNL. Also followed by HF clinic regularly. Continue current regimen        Respiratory   Uncomplicated asthma    Breathing stable without inhalers. F/u if starting to have sxs        Nervous and Auditory   Epilepsy [facility])    Per patient has not seen Neurology in many years, and having some sort of "spells" periodically every few months on current regimen. Referral placed to Neurology for further management and evaluation      Relevant Medications   carbamazepine (TEGRETOL-XR) 200 MG 12 hr tablet   Other Relevant Orders   Ambulatory referral to Neurology   Carbamazepine Level (Tegretol), total (Completed)     Other   Hyperlipidemia, unspecified    Recheck lipids, adjust as needed. Continue working on lifestyle modifications      Relevant Orders   Lipid Panel w/o Chol/HDL Ratio out (Completed)       Follow up  plan: Return in about 6 months (around 04/17/2020) for CPE.

## 2019-10-19 LAB — LIPID PANEL W/O CHOL/HDL RATIO
Cholesterol, Total: 157 mg/dL (ref 100–199)
HDL: 46 mg/dL (ref >39)
LDL Chol Calc (NIH): 96 mg/dL (ref 0–99)
Triglycerides: 78 mg/dL (ref 0–149)
VLDL Cholesterol Cal: 15 mg/dL (ref 5–40)

## 2019-10-19 LAB — CARBAMAZEPINE LEVEL, TOTAL: Carbamazepine (Tegretol), S: 6.3 ug/mL (ref 4.0–12.0)

## 2019-10-21 ENCOUNTER — Encounter: Payer: Self-pay | Admitting: Family Medicine

## 2019-10-21 NOTE — Assessment & Plan Note (Signed)
Breathing stable without inhalers. F/u if starting to have sxs

## 2019-10-21 NOTE — Assessment & Plan Note (Signed)
Recheck lipids, adjust as needed. Continue working on lifestyle modifications 

## 2019-10-21 NOTE — Assessment & Plan Note (Signed)
BPs stable and WNL. Also followed by HF clinic regularly. Continue current regimen

## 2019-10-21 NOTE — Assessment & Plan Note (Signed)
Per patient has not seen Neurology in many years, and having some sort of "spells" periodically every few months on current regimen. Referral placed to Neurology for further management and evaluation

## 2019-10-24 ENCOUNTER — Telehealth: Payer: Self-pay | Admitting: Family Medicine

## 2019-10-24 NOTE — Telephone Encounter (Signed)
Looks like you just saw him °

## 2019-10-24 NOTE — Telephone Encounter (Signed)
Routing to provider  

## 2019-10-24 NOTE — Telephone Encounter (Signed)
Does not come in a capsule to my knowledge. Does he mean standard release instead of XR?

## 2019-10-24 NOTE — Telephone Encounter (Signed)
carbamazepine (TEGRETOL-XR) 200 MG 12 hr tablet     Patient would like to switch back to capsules to save money, if possible.

## 2019-10-24 NOTE — Progress Notes (Signed)
Patient ID: Jose Dakins., male    DOB: 02-Jan-1971, 48 y.o.   MRN: [id number]  Jose [name] is a 48 y/o male with a history of hyperlipidemia, HTN, current tobacco/ alcohol use and chronic heart failure.   Echo report from 01/29/2019 reviewed and showed an EF of 60-65%,  Has not been admitted or been in the ED in the last 6 months.   He presents today for a follow-up visit with a chief complaint of minimal shortness of breath upon moderate exertion. He describes this as chronic in nature having been present for several years. He has associated fatigue and chest tightness along with this. He denies any difficulty sleeping, dizziness, abdominal distention, palpitations, pedal edema, chest pain, cough or weight gain.   He is asking if there are any medications that he can stop at some point.   Past Medical History:  Diagnosis Date  . Alcohol abuse   . Cervicalgia   . CHF (congestive heart failure) ([place])   . Hyperlipidemia   . Hypertension   . Seizures ([place])    Past Surgical History:  Procedure Laterality Date  . COLONOSCOPY WITH PROPOFOL N/A 01/16/2018   Procedure: COLONOSCOPY WITH PROPOFOL;  Surgeon: Lin Landsman, MD;  Location: [facility] ENDOSCOPY;  Service: Gastroenterology;  Laterality: N/A;  . ESOPHAGOGASTRODUODENOSCOPY (EGD) WITH PROPOFOL N/A 01/16/2018   Procedure: ESOPHAGOGASTRODUODENOSCOPY (EGD) WITH PROPOFOL;  Surgeon: Lin Landsman, MD;  Location: [facility] ENDOSCOPY;  Service: Gastroenterology;  Laterality: N/A;   Family History  Problem Relation Age of Onset  . Diabetes Mother   . Colon cancer Mother   . Hypertension Father   . Multiple sclerosis Father    Social History   Tobacco Use  . Smoking status: Current Every Day Smoker    Packs/day: 0.50    Types: Cigars  . Smokeless tobacco: Former Systems developer  . Tobacco comment: 2 cigars a day  Substance Use Topics  . Alcohol use: Yes    Alcohol/week: 28.0 standard drinks    Types: 7 Cans of beer, 21 Shots of liquor per week   Comment: half pint liquor per day   No Known Allergies  Prior to Admission medications   Medication Sig Start Date End Date Taking? Authorizing Provider  amLODipine (NORVASC) 5 MG tablet Take 1 tablet (5 mg total) by mouth daily. 07/03/19  Yes Darylene Price A, FNP  carbamazepine (TEGRETOL-XR) 200 MG 12 hr tablet Take 1 tablet (200 mg total) by mouth 2 (two) times daily. 10/18/19  Yes Volney American, PA-C  carvedilol (COREG) 25 MG tablet Take 1 tablet (25 mg total) by mouth 2 (two) times daily with a meal. 07/03/19  Yes Darylene Price A, FNP  furosemide (LASIX) 40 MG tablet Take 1 tablet (40 mg total) by mouth daily. Patient taking differently: Take 20 mg by mouth daily.  07/03/19 10/25/19 Yes Darylene Price A, FNP  hydrALAZINE (APRESOLINE) 25 MG tablet Take 1 tablet (25 mg total) by mouth 3 (three) times daily. 07/03/19  Yes Darylene Price A, FNP  isosorbide mononitrate (IMDUR) 30 MG 24 hr tablet Take 1 tablet (30 mg total) by mouth daily. 07/03/19  Yes Cambridge Deleo, Otila Kluver A, FNP  lisinopril (ZESTRIL) 10 MG tablet Take 1 tablet (10 mg total) by mouth daily. 09/27/19 12/26/19 Yes Lillianah Swartzentruber, Otila Kluver A, FNP  potassium chloride SA (KLOR-CON) 20 MEQ tablet Take 1 tablet (20 mEq total) by mouth daily. 10/18/19  Yes Volney American, PA-C  simvastatin (ZOCOR) 20 MG tablet Take 1 tablet (20  mg total) by mouth daily. 07/03/19  Yes Alisa Graff, FNP    Review of Systems  Constitutional: Positive for fatigue (intermittent). Negative for appetite change.  HENT: Positive for hearing loss. Negative for congestion and rhinorrhea.   Eyes: Negative.   Respiratory: Positive for chest tightness (at times) and shortness of breath (with moderate exertion). Negative for cough.   Cardiovascular: Negative for chest pain, palpitations and leg swelling.  Gastrointestinal: Negative for abdominal distention and abdominal pain.  Endocrine: Negative.   Genitourinary: Negative.   Musculoskeletal: Negative.   Skin:  Negative.   Allergic/Immunologic: Negative.   Neurological: Negative for dizziness and light-headedness.  Hematological: Negative.   Psychiatric/Behavioral: Negative for dysphoric mood and sleep disturbance. The patient is not nervous/anxious.    Vitals:   10/25/19 0839  BP: (!) 136/91  Pulse: 77  Resp: 20  SpO2: 96%  Weight: 236 lb 6.4 oz (107.2 kg)  Height: 5\' 10"  (1.778 m)   Wt Readings from Last 3 Encounters:  10/25/19 236 lb 6.4 oz (107.2 kg)  10/18/19 232 lb (105.2 kg)  09/27/19 239 lb 6.4 oz (108.6 kg)   Lab Results  Component Value Date   CREATININE 0.56 (L) 09/27/2019   CREATININE 0.61 09/02/2019   CREATININE 0.69 07/10/2019     Physical Exam Vitals and nursing note reviewed.  Constitutional:      Appearance: Normal appearance.  HENT:     Head: Normocephalic and atraumatic.     Right Ear: Decreased hearing noted.     Left Ear: Decreased hearing noted.  Neck:     Vascular: No JVD.  Cardiovascular:     Rate and Rhythm: Normal rate and regular rhythm.  Pulmonary:     Effort: Pulmonary effort is normal.     Breath sounds: No wheezing, rhonchi or rales.  Abdominal:     General: There is no distension.     Palpations: Abdomen is soft.     Tenderness: There is no abdominal tenderness.  Musculoskeletal:        General: No swelling or tenderness.     Cervical back: Normal range of motion and neck supple.  Skin:    General: Skin is warm and dry.  Neurological:     General: No focal deficit present.     Mental Status: He is alert and oriented to person, place, and time.  Psychiatric:        Mood and Affect: Mood normal.        Behavior: Behavior normal.     Assessment & Plan:  1. Chronic heart failure with preserved ejection fraction- - NYHA class II - euvolemic today -  Weighing daily and reminded to call for an overnight weight gain of >2 pounds or a weekly weight gain of > 5 pounds - weight down 3 pounds from last visit ~1 month ago - patient walked  to the office today and was a little short of breath  - not adding salt and is trying to follow a low sodium diet - BNP 07/02/2019 was 32.0 - received his flu vaccine this season  2: HTN- - BP looks ok today - lisinopril increased at last visit to 10mg  daily - will check BMP today  - will increase lisinopril to 20mg  daily; advised him that he could take 2 tablets of his current dose daily until gone and then when he picks up the 20mg  dose, he will take 1 tablet daily - will get BMP at his next visit since increasing his  lisinopril today - saw PCP Orene Desanctis) 10/18/2019 - BMP from 09/27/2019 reviewed and showed sodium 141, potassium 4.4, creatinine 0.56 and GFR >60  3: Tobacco use-  - says that he's smoking ~ 1-2 cigars daily  - complete cessation discussed for 2 minutes with him  Medication bottles were reviewed. Discussed that if his BP gets controlled well with titration up of lisinopril, we could try stopping his hydralazine or amlodipine  Return in 3 weeks or sooner for any questions/problems before then.

## 2019-10-24 NOTE — Telephone Encounter (Signed)
LVM for patient to call us back

## 2019-10-25 ENCOUNTER — Other Ambulatory Visit: Payer: Self-pay

## 2019-10-25 ENCOUNTER — Encounter: Payer: Self-pay | Admitting: Family

## 2019-10-25 ENCOUNTER — Ambulatory Visit: Payer: 59 | Attending: Family | Admitting: Family

## 2019-10-25 VITALS — BP 136/91 | HR 77 | Resp 20 | Ht 70.0 in | Wt 236.4 lb

## 2019-10-25 DIAGNOSIS — Z833 Family history of diabetes mellitus: Secondary | ICD-10-CM | POA: Insufficient documentation

## 2019-10-25 DIAGNOSIS — I5032 Chronic diastolic (congestive) heart failure: Secondary | ICD-10-CM | POA: Diagnosis not present

## 2019-10-25 DIAGNOSIS — R569 Unspecified convulsions: Secondary | ICD-10-CM | POA: Diagnosis not present

## 2019-10-25 DIAGNOSIS — Z79899 Other long term (current) drug therapy: Secondary | ICD-10-CM | POA: Diagnosis not present

## 2019-10-25 DIAGNOSIS — Z8249 Family history of ischemic heart disease and other diseases of the circulatory system: Secondary | ICD-10-CM | POA: Diagnosis not present

## 2019-10-25 DIAGNOSIS — I110 Hypertensive heart disease with heart failure: Secondary | ICD-10-CM | POA: Diagnosis present

## 2019-10-25 DIAGNOSIS — Z800 Family history of malignant neoplasm of digestive organs: Secondary | ICD-10-CM | POA: Diagnosis not present

## 2019-10-25 DIAGNOSIS — E785 Hyperlipidemia, unspecified: Secondary | ICD-10-CM | POA: Diagnosis not present

## 2019-10-25 DIAGNOSIS — F17200 Nicotine dependence, unspecified, uncomplicated: Secondary | ICD-10-CM

## 2019-10-25 DIAGNOSIS — F1011 Alcohol abuse, in remission: Secondary | ICD-10-CM | POA: Diagnosis not present

## 2019-10-25 DIAGNOSIS — F17290 Nicotine dependence, other tobacco product, uncomplicated: Secondary | ICD-10-CM | POA: Insufficient documentation

## 2019-10-25 DIAGNOSIS — I10 Essential (primary) hypertension: Secondary | ICD-10-CM

## 2019-10-25 LAB — BASIC METABOLIC PANEL
Anion gap: 12 (ref 5–15)
BUN: 11 mg/dL (ref 6–20)
CO2: 24 mmol/L (ref 22–32)
Calcium: 8.8 mg/dL — ABNORMAL LOW (ref 8.9–10.3)
Chloride: 102 mmol/L (ref 98–111)
Creatinine, Ser: 0.62 mg/dL (ref 0.61–1.24)
GFR calc Af Amer: >60 mL/min (ref >60)
GFR calc non Af Amer: >60 mL/min (ref >60)
Glucose, Bld: 100 mg/dL — ABNORMAL HIGH (ref 70–99)
Potassium: 3.9 mmol/L (ref 3.5–5.1)
Sodium: 138 mmol/L (ref 135–145)

## 2019-10-25 MED ORDER — CARBAMAZEPINE 200 MG PO TABS: 200.0000 mg | ORAL_TABLET | Freq: Two times a day (BID) | ORAL | 2 refills | Status: DC

## 2019-10-25 MED ORDER — LISINOPRIL 20 MG PO TABS: 20.0000 mg | ORAL_TABLET | Freq: Every day | ORAL | 3 refills | Status: DC

## 2019-10-25 NOTE — Telephone Encounter (Signed)
Called the patient to clarify. He states that the pharmacy told him that if we change the RX, he could get it for $25 instead of $75. So I called the pharmacy. The medication is $75 for the XR but the regular is only $36. Can the patient be changed back to the regular to save money?

## 2019-10-25 NOTE — Patient Instructions (Addendum)
Continue weighing daily and call for an overnight weight gain of > 2 pounds or a weekly weight gain of >5 pounds.  Increase lisinopril to 20mg  daily. You can finish your current bottle by taking 2 tablets daily until gone and then when you pick up your new bottle of 20mg  tablets, you will resume taking 1 tablet daily.

## 2019-10-25 NOTE — Telephone Encounter (Signed)
Patient notified

## 2019-10-25 NOTE — Telephone Encounter (Signed)
Perfect, that's what I assumed was meant from the message. Will send over the regular release now

## 2019-11-15 ENCOUNTER — Ambulatory Visit: Payer: 59 | Admitting: Family

## 2019-11-29 ENCOUNTER — Encounter: Payer: Self-pay | Admitting: Family

## 2019-11-29 ENCOUNTER — Ambulatory Visit: Payer: 59 | Attending: Family | Admitting: Family

## 2019-11-29 ENCOUNTER — Other Ambulatory Visit: Payer: Self-pay

## 2019-11-29 VITALS — BP 158/100 | HR 76 | Resp 20 | Ht 70.0 in | Wt 236.0 lb

## 2019-11-29 DIAGNOSIS — R0602 Shortness of breath: Secondary | ICD-10-CM | POA: Diagnosis present

## 2019-11-29 DIAGNOSIS — F17210 Nicotine dependence, cigarettes, uncomplicated: Secondary | ICD-10-CM | POA: Insufficient documentation

## 2019-11-29 DIAGNOSIS — Z8249 Family history of ischemic heart disease and other diseases of the circulatory system: Secondary | ICD-10-CM | POA: Insufficient documentation

## 2019-11-29 DIAGNOSIS — Z7901 Long term (current) use of anticoagulants: Secondary | ICD-10-CM | POA: Insufficient documentation

## 2019-11-29 DIAGNOSIS — Z79899 Other long term (current) drug therapy: Secondary | ICD-10-CM | POA: Insufficient documentation

## 2019-11-29 DIAGNOSIS — I110 Hypertensive heart disease with heart failure: Secondary | ICD-10-CM | POA: Insufficient documentation

## 2019-11-29 DIAGNOSIS — E785 Hyperlipidemia, unspecified: Secondary | ICD-10-CM | POA: Diagnosis not present

## 2019-11-29 DIAGNOSIS — I5032 Chronic diastolic (congestive) heart failure: Secondary | ICD-10-CM | POA: Diagnosis not present

## 2019-11-29 DIAGNOSIS — R05 Cough: Secondary | ICD-10-CM | POA: Diagnosis not present

## 2019-11-29 DIAGNOSIS — F17200 Nicotine dependence, unspecified, uncomplicated: Secondary | ICD-10-CM

## 2019-11-29 DIAGNOSIS — I10 Essential (primary) hypertension: Secondary | ICD-10-CM

## 2019-11-29 MED ORDER — LISINOPRIL 20 MG PO TABS: 20.0000 mg | ORAL_TABLET | Freq: Every day | ORAL | 3 refills | Status: DC

## 2019-11-29 NOTE — Patient Instructions (Addendum)
Continue weighing daily and call for an overnight weight gain of > 2 pounds or a weekly weight gain of >5 pounds.  Call us back when you return home to review medications.   Bring medications to every visit

## 2019-11-29 NOTE — Progress Notes (Signed)
Patient ID: Jose [name]., male    DOB: [birth year], 49 y.o.   MRN: [id number]  [name] is a 49 y/o male with a history of hyperlipidemia, HTN, current tobacco/ alcohol use and chronic heart failure.   Echo report from 01/29/2019 reviewed and showed an EF of 60-65%,  Has not been admitted or been in the ED in the last 6 months.   He presents today for a follow-up visit with a chief complaint of minimal shortness of breath upon moderate exertion. He describes this as chronic in nature having been present for several years. He has associated fatigue and cough along with this. He denies any difficulty sleeping, abdominal distention, palpitations, pedal edema, chest pain, dizziness or weight gain.   He doesn't have any idea of what medications he's taken or whether he's taking lisinopril 10mg  or 20mg . Says that he took his medications today but that he's irritated because he got called into work today.   Past Medical History:  Diagnosis Date  . Alcohol abuse   . Cervicalgia   . CHF (congestive heart failure) ([place])   . Hyperlipidemia   . Hypertension   . Seizures ([place])    Past Surgical History:  Procedure Laterality Date  . COLONOSCOPY WITH PROPOFOL N/A 01/16/2018   Procedure: COLONOSCOPY WITH PROPOFOL;  Surgeon: Lin Landsman, MD;  Location: [facility] ENDOSCOPY;  Service: Gastroenterology;  Laterality: N/A;  . ESOPHAGOGASTRODUODENOSCOPY (EGD) WITH PROPOFOL N/A 01/16/2018   Procedure: ESOPHAGOGASTRODUODENOSCOPY (EGD) WITH PROPOFOL;  Surgeon: Lin Landsman, MD;  Location: [facility] ENDOSCOPY;  Service: Gastroenterology;  Laterality: N/A;   Family History  Problem Relation Age of Onset  . Diabetes Mother   . Colon cancer Mother   . Hypertension Father   . Multiple sclerosis Father    Social History   Tobacco Use  . Smoking status: Current Every Day Smoker    Packs/day: 0.50    Types: Cigars  . Smokeless tobacco: Former Systems developer  . Tobacco comment: 2 cigars a day  Substance Use Topics   . Alcohol use: Yes    Alcohol/week: 28.0 standard drinks    Types: 7 Cans of beer, 21 Shots of liquor per week    Comment: half pint liquor per day   No Known Allergies  Prior to Admission medications   Medication Sig Start Date End Date Taking? Authorizing Provider  amLODipine (NORVASC) 5 MG tablet Take 1 tablet (5 mg total) by mouth daily. 07/03/19  Yes Darylene Price A, FNP  carbamazepine (TEGRETOL) 200 MG tablet Take 1 tablet (200 mg total) by mouth 2 (two) times daily. 10/25/19  Yes Volney American, PA-C  carvedilol (COREG) 25 MG tablet Take 1 tablet (25 mg total) by mouth 2 (two) times daily with a meal. 07/03/19  Yes Darylene Price A, FNP  furosemide (LASIX) 40 MG tablet Take 1 tablet (40 mg total) by mouth daily. Patient taking differently: Take 20 mg by mouth daily.  07/03/19 11/29/19 Yes Darylene Price A, FNP  hydrALAZINE (APRESOLINE) 25 MG tablet Take 1 tablet (25 mg total) by mouth 3 (three) times daily. 07/03/19  Yes Darylene Price A, FNP  isosorbide mononitrate (IMDUR) 30 MG 24 hr tablet Take 1 tablet (30 mg total) by mouth daily. 07/03/19  Yes Eman Rynders, Otila Kluver A, FNP  lisinopril (ZESTRIL) 20 MG tablet Take 1 tablet (20 mg total) by mouth daily. 10/25/19 01/23/20 Yes Quanetta Truss, Otila Kluver A, FNP  potassium chloride SA (KLOR-CON) 20 MEQ tablet Take 1 tablet (20 mEq total) by  mouth daily. 10/18/19  Yes Volney American, PA-C  simvastatin (ZOCOR) 20 MG tablet Take 1 tablet (20 mg total) by mouth daily. 07/03/19  Yes Alisa Graff, FNP     Review of Systems  Constitutional: Positive for fatigue (intermittent). Negative for appetite change.  HENT: Positive for hearing loss. Negative for congestion and rhinorrhea.   Eyes: Negative.   Respiratory: Positive for cough and shortness of breath (with moderate exertion). Negative for chest tightness.   Cardiovascular: Negative for chest pain, palpitations and leg swelling.  Gastrointestinal: Negative for abdominal distention and abdominal  pain.  Endocrine: Negative.   Genitourinary: Negative.   Musculoskeletal: Negative.   Skin: Negative.   Allergic/Immunologic: Negative.   Neurological: Negative for dizziness and light-headedness.  Hematological: Negative.   Psychiatric/Behavioral: Negative for dysphoric mood and sleep disturbance. The patient is not nervous/anxious.    Vitals:   11/29/19 1158 11/29/19 1209  BP: (!) 158/103 (!) 158/100  Pulse: 76   Resp: 20   SpO2: 97%   Weight: 236 lb (107 kg)   Height: 5\' 10"  (1.778 m)    Wt Readings from Last 3 Encounters:  11/29/19 236 lb (107 kg)  10/25/19 236 lb 6.4 oz (107.2 kg)  10/18/19 232 lb (105.2 kg)   Lab Results  Component Value Date   CREATININE 0.62 10/25/2019   CREATININE 0.56 (L) 09/27/2019   CREATININE 0.61 09/02/2019    Physical Exam Vitals and nursing note reviewed.  Constitutional:      Appearance: Normal appearance.  HENT:     Head: Normocephalic and atraumatic.     Right Ear: Decreased hearing noted.     Left Ear: Decreased hearing noted.  Neck:     Vascular: No JVD.  Cardiovascular:     Rate and Rhythm: Normal rate and regular rhythm.  Pulmonary:     Effort: Pulmonary effort is normal.     Breath sounds: Rhonchi (RLL, improves with cough) present. No wheezing or rales.  Abdominal:     General: There is no distension.     Palpations: Abdomen is soft.     Tenderness: There is no abdominal tenderness.  Musculoskeletal:        General: No swelling or tenderness.     Cervical back: Normal range of motion and neck supple.  Skin:    General: Skin is warm and dry.  Neurological:     General: No focal deficit present.     Mental Status: He is alert and oriented to person, place, and time.  Psychiatric:        Mood and Affect: Mood normal.        Behavior: Behavior normal.     Assessment & Plan:  1. Chronic heart failure with preserved ejection fraction- - NYHA class II - euvolemic today - Weighing daily and reminded to call for an  overnight weight gain of >2 pounds or a weekly weight gain of > 5 pounds - weight unchanged from last visit ~1 month ago - patient walked to the office today and was a little short of breath  - not adding salt and is trying to follow a low sodium diet - BNP 07/02/2019 was 32.0 - received his flu vaccine this season  2: HTN- - BP elevated even upon recheck; patient unsure of what medications he's taking - lisinopril increased at last visit to 20 mg daily although he thinks he may only be taking 10mg  - will not check BMP today as I'm not sure what his  lisinopril dose is and whether he actually increased it or not - saw PCP Orene Desanctis) 10/18/2019 - BMP from 10/25/2019 reviewed and showed sodium 138, potassium 3.9, creatinine 0.62 and GFR >60  3: Tobacco use-  - says that he's smoking ~ 1-2 cigars daily  - complete cessation discussed for 2 minutes with him  Patient did not bring his medications with him but "thinks" he's still only taking 10mg  lisinopril daily and didn't increase it per instructions at his last visit. Patient to go home and call us back after comparing his bottles to our medication list. Emphasized the importance of bringing his bottles to every visit every time.   Patient called back prior to finishing this note and confirms that he's only taking lisinopril 10mg  once daily. Instructed him to take 2 of his tablets every day until gone and then get new RX that was sent in on 10/25/2019 for the 20mg  dose. Again emphasized that he needs to bring his bottles to every visit so that we can make sure his list is accurate.   Return in 1 month or sooner for any questions/problems before then.

## 2019-11-30 ENCOUNTER — Encounter: Payer: Self-pay | Admitting: Family

## 2019-12-27 ENCOUNTER — Ambulatory Visit: Payer: 59 | Admitting: Family

## 2020-01-02 NOTE — Progress Notes (Signed)
Patient ID: Jose Dakins., male    DOB: 1971-08-04, 49 y.o.   MRN: [id number]  Jose [name] is a 49 y/o male with a history of hyperlipidemia, HTN, current tobacco/ alcohol use and chronic heart failure.   Echo report from 01/29/2019 reviewed and showed an EF of 60-65%,  Has not been admitted or been in the ED in the last 6 months.   He presents today for a follow-up visit with a chief complaint of minimal shortness of breath upon moderate exertion. He describes this as chronic in nature having been present for many years. He has associated cough and fatigue along with this. He denies any difficulty sleeping, dizziness, abdominal distention, palpitations, pedal edema, chest pain or weight gain.   Brought his medication bottles in for review.   Past Medical History:  Diagnosis Date  . Alcohol abuse   . Cervicalgia   . CHF (congestive heart failure) ([place])   . Hyperlipidemia   . Hypertension   . Seizures ([place])    Past Surgical History:  Procedure Laterality Date  . COLONOSCOPY WITH PROPOFOL N/A 01/16/2018   Procedure: COLONOSCOPY WITH PROPOFOL;  Surgeon: Lin Landsman, MD;  Location: [facility] ENDOSCOPY;  Service: Gastroenterology;  Laterality: N/A;  . ESOPHAGOGASTRODUODENOSCOPY (EGD) WITH PROPOFOL N/A 01/16/2018   Procedure: ESOPHAGOGASTRODUODENOSCOPY (EGD) WITH PROPOFOL;  Surgeon: Lin Landsman, MD;  Location: [facility] ENDOSCOPY;  Service: Gastroenterology;  Laterality: N/A;   Family History  Problem Relation Age of Onset  . Diabetes Mother   . Colon cancer Mother   . Hypertension Father   . Multiple sclerosis Father    Social History   Tobacco Use  . Smoking status: Current Every Day Smoker    Packs/day: 0.50    Types: Cigars  . Smokeless tobacco: Former Systems developer  . Tobacco comment: 2 cigars a day  Substance Use Topics  . Alcohol use: Yes    Alcohol/week: 28.0 standard drinks    Types: 7 Cans of beer, 21 Shots of liquor per week    Comment: half pint liquor per day   No Known  Allergies  Prior to Admission medications   Medication Sig Start Date End Date Taking? Authorizing Provider  amLODipine (NORVASC) 5 MG tablet Take 1 tablet (5 mg total) by mouth daily. 07/03/19  Yes Darylene Price A, FNP  carbamazepine (TEGRETOL) 200 MG tablet Take 1 tablet (200 mg total) by mouth 2 (two) times daily. 10/25/19  Yes Volney American, PA-C  carvedilol (COREG) 25 MG tablet Take 1 tablet (25 mg total) by mouth 2 (two) times daily with a meal. 07/03/19  Yes Darylene Price A, FNP  furosemide (LASIX) 40 MG tablet Take 1 tablet (40 mg total) by mouth daily. Patient taking differently: Take 20 mg by mouth daily.  07/03/19 01/03/20 Yes Darylene Price A, FNP  hydrALAZINE (APRESOLINE) 25 MG tablet Take 1 tablet (25 mg total) by mouth 3 (three) times daily. 07/03/19  Yes Darylene Price A, FNP  isosorbide mononitrate (IMDUR) 30 MG 24 hr tablet Take 1 tablet (30 mg total) by mouth daily. 07/03/19  Yes Chandria Rookstool, Otila Kluver A, FNP  lisinopril (ZESTRIL) 20 MG tablet Take 1 tablet (20 mg total) by mouth daily. 11/29/19 02/27/20 Yes Princeton Nabor, Otila Kluver A, FNP  potassium chloride SA (KLOR-CON) 20 MEQ tablet Take 1 tablet (20 mEq total) by mouth daily. 10/18/19  Yes Volney American, PA-C  simvastatin (ZOCOR) 20 MG tablet Take 1 tablet (20 mg total) by mouth daily. 07/03/19  Yes Darylene Price  A, FNP     Review of Systems  Constitutional: Positive for fatigue (intermittent). Negative for appetite change.  HENT: Positive for hearing loss. Negative for congestion and rhinorrhea.   Eyes: Negative.   Respiratory: Positive for cough and shortness of breath (with moderate exertion). Negative for chest tightness.   Cardiovascular: Negative for chest pain, palpitations and leg swelling.  Gastrointestinal: Negative for abdominal distention and abdominal pain.  Endocrine: Negative.   Genitourinary: Negative.   Musculoskeletal: Negative.   Skin: Negative.   Allergic/Immunologic: Negative.   Neurological: Negative for  dizziness and light-headedness.  Hematological: Negative.   Psychiatric/Behavioral: Negative for dysphoric mood and sleep disturbance. The patient is not nervous/anxious.    Vitals:   01/03/20 0856  BP: (!) 151/85  Pulse: 72  Resp: 18  SpO2: 97%  Weight: 230 lb 3.2 oz (104.4 kg)  Height: 5\' 10"  (1.778 m)   Wt Readings from Last 3 Encounters:  01/03/20 230 lb 3.2 oz (104.4 kg)  11/29/19 236 lb (107 kg)  10/25/19 236 lb 6.4 oz (107.2 kg)   Lab Results  Component Value Date   CREATININE 0.62 10/25/2019   CREATININE 0.56 (L) 09/27/2019   CREATININE 0.61 09/02/2019     Physical Exam Vitals and nursing note reviewed.  Constitutional:      Appearance: Normal appearance.  HENT:     Head: Normocephalic and atraumatic.     Right Ear: Decreased hearing noted.     Left Ear: Decreased hearing noted.  Neck:     Vascular: No JVD.  Cardiovascular:     Rate and Rhythm: Normal rate and regular rhythm.  Pulmonary:     Effort: Pulmonary effort is normal.     Breath sounds: No wheezing, rhonchi or rales.  Abdominal:     General: There is no distension.     Palpations: Abdomen is soft.     Tenderness: There is no abdominal tenderness.  Musculoskeletal:        General: No swelling or tenderness.     Cervical back: Normal range of motion and neck supple.  Skin:    General: Skin is warm and dry.  Neurological:     General: No focal deficit present.     Mental Status: He is alert and oriented to person, place, and time.  Psychiatric:        Mood and Affect: Mood normal.        Behavior: Behavior normal.     Assessment & Plan:  1. Chronic heart failure with preserved ejection fraction- - NYHA class II - euvolemic today - Weighing daily and reminded to call for an overnight weight gain of >2 pounds or a weekly weight gain of > 5 pounds - weight down 6 pounds from last visit 1 month ago - lisinopril increased to 20mg  at last visit - will get BMP today - patient walked to the  office today and was a little short of breath  - not adding salt and is trying to follow a low sodium diet - BNP 07/02/2019 was 32.0 - received his flu vaccine this season  2: HTN- - BP mildly elevated although improved; will increase lisinopril to 40mg  daily - advised patient that he could finish his current bottle by taking 2 tablets daily until gone and then begin the 40mg  dose as 1 tablet daily - will get BMP at his next visit since increasing lisinopril today - saw PCP Orene Desanctis) 10/18/2019 - BMP from 10/25/2019 reviewed and showed sodium 138, potassium 3.9,  creatinine 0.62 and GFR >60  3: Tobacco use-  - says that he's smoking ~ 1-2 cigars daily  - complete cessation discussed for 2 minutes with him  Medication bottles reviewed.   Return in 1 month or sooner for any questions/problems before then.

## 2020-01-03 ENCOUNTER — Ambulatory Visit: Payer: 59 | Attending: Family | Admitting: Family

## 2020-01-03 ENCOUNTER — Encounter: Payer: Self-pay | Admitting: Family

## 2020-01-03 ENCOUNTER — Other Ambulatory Visit: Payer: Self-pay

## 2020-01-03 VITALS — BP 151/85 | HR 72 | Resp 18 | Ht 70.0 in | Wt 230.2 lb

## 2020-01-03 DIAGNOSIS — R0602 Shortness of breath: Secondary | ICD-10-CM | POA: Insufficient documentation

## 2020-01-03 DIAGNOSIS — I5032 Chronic diastolic (congestive) heart failure: Secondary | ICD-10-CM | POA: Diagnosis not present

## 2020-01-03 DIAGNOSIS — Z8249 Family history of ischemic heart disease and other diseases of the circulatory system: Secondary | ICD-10-CM | POA: Insufficient documentation

## 2020-01-03 DIAGNOSIS — F17210 Nicotine dependence, cigarettes, uncomplicated: Secondary | ICD-10-CM | POA: Insufficient documentation

## 2020-01-03 DIAGNOSIS — I110 Hypertensive heart disease with heart failure: Secondary | ICD-10-CM | POA: Diagnosis not present

## 2020-01-03 DIAGNOSIS — F17200 Nicotine dependence, unspecified, uncomplicated: Secondary | ICD-10-CM

## 2020-01-03 DIAGNOSIS — Z79899 Other long term (current) drug therapy: Secondary | ICD-10-CM | POA: Diagnosis not present

## 2020-01-03 DIAGNOSIS — E785 Hyperlipidemia, unspecified: Secondary | ICD-10-CM | POA: Diagnosis not present

## 2020-01-03 DIAGNOSIS — I10 Essential (primary) hypertension: Secondary | ICD-10-CM

## 2020-01-03 LAB — BASIC METABOLIC PANEL
Anion gap: 6 (ref 5–15)
BUN: 12 mg/dL (ref 6–20)
CO2: 31 mmol/L (ref 22–32)
Calcium: 8.8 mg/dL — ABNORMAL LOW (ref 8.9–10.3)
Chloride: 103 mmol/L (ref 98–111)
Creatinine, Ser: 0.71 mg/dL (ref 0.61–1.24)
GFR calc Af Amer: >60 mL/min (ref >60)
GFR calc non Af Amer: >60 mL/min (ref >60)
Glucose, Bld: 100 mg/dL — ABNORMAL HIGH (ref 70–99)
Potassium: 3.9 mmol/L (ref 3.5–5.1)
Sodium: 140 mmol/L (ref 135–145)

## 2020-01-03 MED ORDER — LISINOPRIL 40 MG PO TABS: 40.0000 mg | ORAL_TABLET | Freq: Every day | ORAL | 3 refills | Status: DC

## 2020-01-03 NOTE — Patient Instructions (Addendum)
Continue weighing daily and call for an overnight weight gain of > 2 pounds or a weekly weight gain of >5 pounds.  Finish current lisinopril by taking 2 tablets every morning until gone. When you pick up the new prescription of 40mg , you will then resume taking 1 tablet daily.

## 2020-01-30 NOTE — Progress Notes (Deleted)
Patient ID: Jose [name]., male    DOB: February 13, 1971, 49 y.o.   MRN: [id number]  [name] is a 49 y/o male with a history of hyperlipidemia, HTN, current tobacco/ alcohol use and chronic heart failure.   Echo report from 01/29/2019 reviewed and showed an EF of 60-65%,  Has not been admitted or been in the ED in the last 6 months.   He presents today for a follow-up visit with a chief complaint of   Past Medical History:  Diagnosis Date  . Alcohol abuse   . Cervicalgia   . CHF (congestive heart failure) ([place])   . Hyperlipidemia   . Hypertension   . Seizures ([place])    Past Surgical History:  Procedure Laterality Date  . COLONOSCOPY WITH PROPOFOL N/A 01/16/2018   Procedure: COLONOSCOPY WITH PROPOFOL;  Surgeon: Lin Landsman, MD;  Location: [facility] ENDOSCOPY;  Service: Gastroenterology;  Laterality: N/A;  . ESOPHAGOGASTRODUODENOSCOPY (EGD) WITH PROPOFOL N/A 01/16/2018   Procedure: ESOPHAGOGASTRODUODENOSCOPY (EGD) WITH PROPOFOL;  Surgeon: Lin Landsman, MD;  Location: [facility] ENDOSCOPY;  Service: Gastroenterology;  Laterality: N/A;   Family History  Problem Relation Age of Onset  . Diabetes Mother   . Colon cancer Mother   . Hypertension Father   . Multiple sclerosis Father    Social History   Tobacco Use  . Smoking status: Current Every Day Smoker    Packs/day: 0.50    Types: Cigars  . Smokeless tobacco: Former Systems developer  . Tobacco comment: 2 cigars a day  Substance Use Topics  . Alcohol use: Yes    Alcohol/week: 28.0 standard drinks    Types: 7 Cans of beer, 21 Shots of liquor per week    Comment: half pint liquor per day   No Known Allergies     Review of Systems  Constitutional: Positive for fatigue (intermittent). Negative for appetite change.  HENT: Positive for hearing loss. Negative for congestion and rhinorrhea.   Eyes: Negative.   Respiratory: Positive for cough and shortness of breath (with moderate exertion). Negative for chest tightness.   Cardiovascular:  Negative for chest pain, palpitations and leg swelling.  Gastrointestinal: Negative for abdominal distention and abdominal pain.  Endocrine: Negative.   Genitourinary: Negative.   Musculoskeletal: Negative.   Skin: Negative.   Allergic/Immunologic: Negative.   Neurological: Negative for dizziness and light-headedness.  Hematological: Negative.   Psychiatric/Behavioral: Negative for dysphoric mood and sleep disturbance. The patient is not nervous/anxious.       Physical Exam Vitals and nursing note reviewed.  Constitutional:      Appearance: Normal appearance.  HENT:     Head: Normocephalic and atraumatic.     Right Ear: Decreased hearing noted.     Left Ear: Decreased hearing noted.  Neck:     Vascular: No JVD.  Cardiovascular:     Rate and Rhythm: Normal rate and regular rhythm.  Pulmonary:     Effort: Pulmonary effort is normal.     Breath sounds: No wheezing, rhonchi or rales.  Abdominal:     General: There is no distension.     Palpations: Abdomen is soft.     Tenderness: There is no abdominal tenderness.  Musculoskeletal:        General: No swelling or tenderness.     Cervical back: Normal range of motion and neck supple.  Skin:    General: Skin is warm and dry.  Neurological:     General: No focal deficit present.     Mental  Status: He is alert and oriented to person, place, and time.  Psychiatric:        Mood and Affect: Mood normal.        Behavior: Behavior normal.     Assessment & Plan:  1. Chronic heart failure with preserved ejection fraction- - NYHA class II - euvolemic today - Weighing daily and reminded to call for an overnight weight gain of >2 pounds or a weekly weight gain of > 5 pounds - weight 230.2 pounds from last visit 1 month ago - lisinopril increased to 40mg  at last visit - will get BMP today - patient walked to the office today and was a little short of breath  - not adding salt and is trying to follow a low sodium diet - BNP  07/02/2019 was 32.0 - received his flu vaccine this season  2: HTN- - BP  - saw PCP Orene Desanctis) 10/18/2019 - BMP from 01/03/20 reviewed and showed sodium 140, potassium 3.9, creatinine 0.71 and GFR >60  3: Tobacco use-  - says that he's smoking ~ 1-2 cigars daily  - complete cessation discussed for 2 minutes with him   Medication bottles reviewed.

## 2020-01-31 ENCOUNTER — Ambulatory Visit: Payer: 59 | Admitting: Family

## 2020-02-05 NOTE — Progress Notes (Signed)
Patient ID: Jose Dakins., male    DOB: 09-17-1971, 49 y.o.   MRN: [id number]  Jose [name] is a 49 y/o male with a history of hyperlipidemia, HTN, current tobacco/ alcohol use and chronic heart failure.   Echo report from 01/29/2019 reviewed and showed an EF of 60-65%,  Has not been admitted or been in the ED in the last 6 months.   He presents today for a follow-up visit with a chief complaint of minimal shortness of breath upon moderate exertion. He describes this as chronic in nature having been present for several years. He has associated fatigue and cough along with this. He denies any difficulty sleeping, dizziness, abdominal distention, palpitations, pedal edema, chest pain or weight gain.   Past Medical History:  Diagnosis Date  . Alcohol abuse   . Cervicalgia   . CHF (congestive heart failure) ([place])   . Hyperlipidemia   . Hypertension   . Seizures ([place])    Past Surgical History:  Procedure Laterality Date  . COLONOSCOPY WITH PROPOFOL N/A 01/16/2018   Procedure: COLONOSCOPY WITH PROPOFOL;  Surgeon: Lin Landsman, MD;  Location: [facility] ENDOSCOPY;  Service: Gastroenterology;  Laterality: N/A;  . ESOPHAGOGASTRODUODENOSCOPY (EGD) WITH PROPOFOL N/A 01/16/2018   Procedure: ESOPHAGOGASTRODUODENOSCOPY (EGD) WITH PROPOFOL;  Surgeon: Lin Landsman, MD;  Location: [facility] ENDOSCOPY;  Service: Gastroenterology;  Laterality: N/A;   Family History  Problem Relation Age of Onset  . Diabetes Mother   . Colon cancer Mother   . Hypertension Father   . Multiple sclerosis Father    Social History   Tobacco Use  . Smoking status: Current Every Day Smoker    Packs/day: 0.50    Types: Cigars  . Smokeless tobacco: Former Systems developer  . Tobacco comment: 2 cigars a day  Substance Use Topics  . Alcohol use: Yes    Alcohol/week: 28.0 standard drinks    Types: 7 Cans of beer, 21 Shots of liquor per week    Comment: half pint liquor per day   No Known Allergies  Prior to Admission medications    Medication Sig Start Date End Date Taking? Authorizing Provider  amLODipine (NORVASC) 5 MG tablet Take 1 tablet (5 mg total) by mouth daily. 07/03/19  Yes Darylene Price A, FNP  carbamazepine (TEGRETOL) 200 MG tablet Take 1 tablet (200 mg total) by mouth 2 (two) times daily. 10/25/19  Yes Volney American, PA-C  carvedilol (COREG) 25 MG tablet Take 1 tablet (25 mg total) by mouth 2 (two) times daily with a meal. 07/03/19  Yes Darylene Price A, FNP  furosemide (LASIX) 40 MG tablet Take 1 tablet (40 mg total) by mouth daily. Patient taking differently: Take 20 mg by mouth daily.  07/03/19 02/06/20 Yes Bernadetta Roell, Otila Kluver A, FNP  hydrALAZINE (APRESOLINE) 25 MG tablet Take 1 tablet (25 mg total) by mouth 3 (three) times daily. 07/03/19  Yes Darylene Price A, FNP  isosorbide mononitrate (IMDUR) 30 MG 24 hr tablet Take 1 tablet (30 mg total) by mouth daily. 07/03/19  Yes Tanairy Payeur, Otila Kluver A, FNP  lisinopril (ZESTRIL) 40 MG tablet Take 1 tablet (40 mg total) by mouth daily. 01/03/20 04/02/20 Yes Vlasta Baskin, Otila Kluver A, FNP  potassium chloride SA (KLOR-CON) 20 MEQ tablet Take 1 tablet (20 mEq total) by mouth daily. 10/18/19  Yes Volney American, PA-C  simvastatin (ZOCOR) 20 MG tablet Take 1 tablet (20 mg total) by mouth daily. 07/03/19  Yes Alisa Graff, FNP    Review of Systems  Constitutional: Positive for fatigue (intermittent). Negative for appetite change.  HENT: Positive for hearing loss. Negative for congestion and rhinorrhea.   Eyes: Negative.   Respiratory: Positive for cough and shortness of breath (with moderate exertion). Negative for chest tightness.   Cardiovascular: Negative for chest pain, palpitations and leg swelling.  Gastrointestinal: Negative for abdominal distention and abdominal pain.  Endocrine: Negative.   Genitourinary: Negative.   Musculoskeletal: Negative.   Skin: Negative.   Allergic/Immunologic: Negative.   Neurological: Negative for dizziness and light-headedness.  Hematological:  Negative.   Psychiatric/Behavioral: Negative for dysphoric mood and sleep disturbance. The patient is not nervous/anxious.    Vitals:   02/06/20 0910  BP: 128/90  Pulse: 80  Resp: 20  SpO2: 97%  Weight: 232 lb 8 oz (105.5 kg)  Height: 5\' 10"  (1.778 m)   Wt Readings from Last 3 Encounters:  02/06/20 232 lb 8 oz (105.5 kg)  01/03/20 230 lb 3.2 oz (104.4 kg)  11/29/19 236 lb (107 kg)   Lab Results  Component Value Date   CREATININE 0.71 01/03/2020   CREATININE 0.62 10/25/2019   CREATININE 0.56 (L) 09/27/2019    Physical Exam Vitals and nursing note reviewed.  Constitutional:      Appearance: Normal appearance.  HENT:     Head: Normocephalic and atraumatic.     Right Ear: Decreased hearing noted.     Left Ear: Decreased hearing noted.  Neck:     Vascular: No JVD.  Cardiovascular:     Rate and Rhythm: Normal rate and regular rhythm.  Pulmonary:     Effort: Pulmonary effort is normal.     Breath sounds: No wheezing, rhonchi or rales.  Abdominal:     General: There is no distension.     Palpations: Abdomen is soft.     Tenderness: There is no abdominal tenderness.  Musculoskeletal:        General: No swelling or tenderness.     Cervical back: Normal range of motion and neck supple.  Skin:    General: Skin is warm and dry.  Neurological:     General: No focal deficit present.     Mental Status: He is alert and oriented to person, place, and time.  Psychiatric:        Mood and Affect: Mood normal.        Behavior: Behavior normal.     Assessment & Plan:  1. Chronic heart failure with preserved ejection fraction- - NYHA class II - euvolemic today - Weighing daily and reminded to call for an overnight weight gain of >2 pounds or a weekly weight gain of > 5 pounds - weight up 2 pounds from last visit 1 month ago - lisinopril increased to 40mg  at last visit - will get BMP today - patient walked to the office today and was a little short of breath but then quickly  recovered after sitting - not adding salt and is trying to follow a low sodium diet - BNP 07/02/2019 was 32.0 - received his flu vaccine this season  2: HTN- - BP was initially elevated after walking to the office (150/102) but had improved greatly after sitting for a few minutes (128/90) - consider increasing amlodipine and possibly stopping his hydralazine to simplify his regimen - saw PCP Orene Desanctis) 10/18/2019 & returns in June for a physical  - BMP from 01/03/20 reviewed and showed sodium 140, potassium 3.9, creatinine 0.71 and GFR >60  3: Tobacco use-  - says that he's smoking ~  1-2 cigars daily  - has 1 shot of liquor and maybe 1/2 a beer daily - complete cessation discussed for 2 minutes with him   Medication bottles reviewed.   Return in 3 months or sooner for any questions/problems before then.

## 2020-02-06 ENCOUNTER — Encounter: Payer: Self-pay | Admitting: Family

## 2020-02-06 ENCOUNTER — Other Ambulatory Visit: Payer: Self-pay

## 2020-02-06 ENCOUNTER — Ambulatory Visit: Payer: 59 | Attending: Family | Admitting: Family

## 2020-02-06 VITALS — BP 128/90 | HR 80 | Resp 20 | Ht 70.0 in | Wt 232.5 lb

## 2020-02-06 DIAGNOSIS — I110 Hypertensive heart disease with heart failure: Secondary | ICD-10-CM | POA: Insufficient documentation

## 2020-02-06 DIAGNOSIS — Z8249 Family history of ischemic heart disease and other diseases of the circulatory system: Secondary | ICD-10-CM | POA: Diagnosis not present

## 2020-02-06 DIAGNOSIS — Z79899 Other long term (current) drug therapy: Secondary | ICD-10-CM | POA: Diagnosis not present

## 2020-02-06 DIAGNOSIS — I5032 Chronic diastolic (congestive) heart failure: Secondary | ICD-10-CM | POA: Insufficient documentation

## 2020-02-06 DIAGNOSIS — E785 Hyperlipidemia, unspecified: Secondary | ICD-10-CM | POA: Diagnosis not present

## 2020-02-06 DIAGNOSIS — F17290 Nicotine dependence, other tobacco product, uncomplicated: Secondary | ICD-10-CM | POA: Insufficient documentation

## 2020-02-06 DIAGNOSIS — I10 Essential (primary) hypertension: Secondary | ICD-10-CM

## 2020-02-06 DIAGNOSIS — F17200 Nicotine dependence, unspecified, uncomplicated: Secondary | ICD-10-CM

## 2020-02-06 LAB — BASIC METABOLIC PANEL
Anion gap: 10 (ref 5–15)
BUN: 12 mg/dL (ref 6–20)
CO2: 28 mmol/L (ref 22–32)
Calcium: 8.1 mg/dL — ABNORMAL LOW (ref 8.9–10.3)
Chloride: 102 mmol/L (ref 98–111)
Creatinine, Ser: 0.69 mg/dL (ref 0.61–1.24)
GFR calc Af Amer: >60 mL/min (ref >60)
GFR calc non Af Amer: >60 mL/min (ref >60)
Glucose, Bld: 94 mg/dL (ref 70–99)
Potassium: 3.7 mmol/L (ref 3.5–5.1)
Sodium: 140 mmol/L (ref 135–145)

## 2020-02-06 NOTE — Patient Instructions (Signed)
Continue weighing daily and call for an overnight weight gain of > 2 pounds or a weekly weight gain of >5 pounds. 

## 2020-04-17 ENCOUNTER — Encounter: Payer: 59 | Admitting: Family Medicine

## 2020-05-12 ENCOUNTER — Encounter: Payer: Self-pay | Admitting: Family Medicine

## 2020-05-12 ENCOUNTER — Ambulatory Visit (INDEPENDENT_AMBULATORY_CARE_PROVIDER_SITE_OTHER): Payer: 59 | Admitting: Family Medicine

## 2020-05-12 ENCOUNTER — Other Ambulatory Visit: Payer: Self-pay

## 2020-05-12 VITALS — BP 180/110 | HR 68 | Temp 98.8°F | Ht 68.9 in | Wt 221.4 lb

## 2020-05-12 DIAGNOSIS — I10 Essential (primary) hypertension: Secondary | ICD-10-CM | POA: Diagnosis not present

## 2020-05-12 DIAGNOSIS — R569 Unspecified convulsions: Secondary | ICD-10-CM

## 2020-05-12 DIAGNOSIS — Z125 Encounter for screening for malignant neoplasm of prostate: Secondary | ICD-10-CM

## 2020-05-12 DIAGNOSIS — J449 Chronic obstructive pulmonary disease, unspecified: Secondary | ICD-10-CM

## 2020-05-12 DIAGNOSIS — Z0000 Encounter for general adult medical examination without abnormal findings: Secondary | ICD-10-CM

## 2020-05-12 DIAGNOSIS — E785 Hyperlipidemia, unspecified: Secondary | ICD-10-CM | POA: Diagnosis not present

## 2020-05-12 LAB — UA/M W/RFLX CULTURE, ROUTINE
Bilirubin, UA: NEGATIVE
Glucose, UA: NEGATIVE
Ketones, UA: NEGATIVE
Leukocytes,UA: NEGATIVE
Nitrite, UA: NEGATIVE
Specific Gravity, UA: >1.03 — ABNORMAL HIGH (ref 1.005–1.030)
Urobilinogen, Ur: 0.2 mg/dL (ref 0.2–1.0)
pH, UA: 5.5 (ref 5.0–7.5)

## 2020-05-12 LAB — MICROSCOPIC EXAMINATION
Bacteria, UA: NONE SEEN
WBC, UA: NONE SEEN /hpf (ref 0–5)

## 2020-05-12 MED ORDER — POTASSIUM CHLORIDE CRYS ER 20 MEQ PO TBCR: 20.0000 meq | EXTENDED_RELEASE_TABLET | Freq: Every day | ORAL | 1 refills | Status: DC

## 2020-05-12 MED ORDER — CARBAMAZEPINE 200 MG PO TABS: 200.0000 mg | ORAL_TABLET | Freq: Two times a day (BID) | ORAL | 1 refills | Status: DC

## 2020-05-12 NOTE — Assessment & Plan Note (Signed)
Recheck lipids, adjust as needed. Continue current regimen 

## 2020-05-12 NOTE — Progress Notes (Signed)
BP (!) 180/110   Pulse 68   Temp 98.8 F (37.1 C)   Ht 5' 8.9" (1.75 m)   Wt 221 lb 6 oz (100.4 kg)   SpO2 96%   BMI 32.79 kg/m    Subjective:    Patient ID: Jose [name]., male    DOB: 07-01-1971, 49 y.o.   MRN: [id number]  HPI: Jose Ermis. is a 49 y.o. male presenting on 05/12/2020 for comprehensive medical examination. Current medical complaints include:see below  HTN, CHF - does not have a monitor at home to be checking but planning to get one soon. States he was out of town for the holiday weekend and forgot his medications so has not been taking them for several days. Followed by Cardiology for these issues as well fairly closely and per most recent notes things have been stable overall. Denies CP, SOB, HAs, dizziness, leg swelling.   COPD - no recent exacerbations, not on any inhalers.  Seizures - stable on tegretol, no side effects, taking faithfully. Awaiting getting established with Neurology.   HLD - on simvastatin, tolerating well without side effects. Denies myalgias, claudication, CP, SOB.   He currently lives with: Interim Problems from his last visit: no  Depression Screen done today and results listed below:  Depression screen [facility] 2/9 05/12/2020 12/25/2017  Decreased Interest 0 0  Down, Depressed, Hopeless 0 1  PHQ - 2 Score 0 1    The patient does not have a history of falls. I did complete a risk assessment for falls. A plan of care for falls was documented.   Past Medical History:  Past Medical History:  Diagnosis Date  . Alcohol abuse   . Cervicalgia   . CHF (congestive heart failure) ([place])   . Hyperlipidemia   . Hypertension   . Seizures [facility])     Surgical History:  Past Surgical History:  Procedure Laterality Date  . COLONOSCOPY WITH PROPOFOL N/A 01/16/2018   Procedure: COLONOSCOPY WITH PROPOFOL;  Surgeon: Lin Landsman, MD;  Location: [facility] ENDOSCOPY;  Service: Gastroenterology;  Laterality: N/A;  . ESOPHAGOGASTRODUODENOSCOPY (EGD)  WITH PROPOFOL N/A 01/16/2018   Procedure: ESOPHAGOGASTRODUODENOSCOPY (EGD) WITH PROPOFOL;  Surgeon: Lin Landsman, MD;  Location: [facility] ENDOSCOPY;  Service: Gastroenterology;  Laterality: N/A;    Medications:  Current Outpatient Medications on File Prior to Visit  Medication Sig  . amLODipine (NORVASC) 5 MG tablet Take 1 tablet (5 mg total) by mouth daily.  . carvedilol (COREG) 25 MG tablet Take 1 tablet (25 mg total) by mouth 2 (two) times daily with a meal.  . furosemide (LASIX) 40 MG tablet Take 1 tablet (40 mg total) by mouth daily. (Patient taking differently: Take 20 mg by mouth daily. )  . hydrALAZINE (APRESOLINE) 25 MG tablet Take 1 tablet (25 mg total) by mouth 3 (three) times daily.  . isosorbide mononitrate (IMDUR) 30 MG 24 hr tablet Take 1 tablet (30 mg total) by mouth daily.  Marland Kitchen lisinopril (ZESTRIL) 40 MG tablet Take 1 tablet (40 mg total) by mouth daily.  . simvastatin (ZOCOR) 20 MG tablet Take 1 tablet (20 mg total) by mouth daily.   No current facility-administered medications on file prior to visit.    Allergies:  No Known Allergies  Social History:  Social History   Socioeconomic History  . Marital status: Widowed    Spouse name: Not on file  . Number of children: Not on file  . Years of education: Not on file  .  Highest education level: Not on file  Occupational History  . Not on file  Tobacco Use  . Smoking status: Current Every Day Smoker    Packs/day: 0.50    Types: Cigars  . Smokeless tobacco: Former Systems developer  . Tobacco comment: 2 cigars a day  Vaping Use  . Vaping Use: Never used  Substance and Sexual Activity  . Alcohol use: Yes    Alcohol/week: 28.0 standard drinks    Types: 7 Cans of beer, 21 Shots of liquor per week    Comment: half pint liquor per day  . Drug use: No    Types: Marijuana    Comment: quit back in the late 90's  . Sexual activity: Yes  Other Topics Concern  . Not on file  Social History Narrative  . Not on file   Social  Determinants of Health   Financial Resource Strain: Low Risk   . Difficulty of Paying Living Expenses: Not hard at all  Food Insecurity: No Food Insecurity  . Worried About Charity fundraiser in the Last Year: Never true  . Ran Out of Food in the Last Year: Never true  Transportation Needs: No Transportation Needs  . Lack of Transportation (Medical): No  . Lack of Transportation (Non-Medical): No  Physical Activity: Sufficiently Active  . Days of Exercise per Week: 3 days  . Minutes of Exercise per Session: 50 min  Stress: No Stress Concern Present  . Feeling of Stress : Not at all  Social Connections: Moderately Isolated  . Frequency of Communication with Friends and Family: Three times a week  . Frequency of Social Gatherings with Friends and Family: Three times a week  . Attends Religious Services: 1 to 4 times per year  . Active Member of Clubs or Organizations: No  . Attends Archivist Meetings: Never  . Marital Status: Widowed  Intimate Partner Violence: Not At Risk  . Fear of Current or Ex-Partner: No  . Emotionally Abused: No  . Physically Abused: No  . Sexually Abused: No   Social History   Tobacco Use  Smoking Status Current Every Day Smoker  . Packs/day: 0.50  . Types: Cigars  Smokeless Tobacco Former Systems developer  Tobacco Comment   2 cigars a day   Social History   Substance and Sexual Activity  Alcohol Use Yes  . Alcohol/week: 28.0 standard drinks  . Types: 7 Cans of beer, 21 Shots of liquor per week   Comment: half pint liquor per day    Family History:  Family History  Problem Relation Age of Onset  . Diabetes Mother   . Colon cancer Mother   . Hypertension Father   . Multiple sclerosis Father     Past medical history, surgical history, medications, allergies, family history and social history reviewed with patient today and changes made to appropriate areas of the chart.   Review of Systems - General ROS: negative Psychological ROS:  negative Ophthalmic ROS: negative ENT ROS: negative Allergy and Immunology ROS: negative Hematological and Lymphatic ROS: negative Endocrine ROS: negative Breast ROS: negative for breast lumps Respiratory ROS: no cough, shortness of breath, or wheezing Cardiovascular ROS: no chest pain or dyspnea on exertion Gastrointestinal ROS: no abdominal pain, change in bowel habits, or black or bloody stools Genito-Urinary ROS: no dysuria, trouble voiding, or hematuria Musculoskeletal ROS: negative Neurological ROS: no TIA or stroke symptoms Dermatological ROS: negative All other ROS negative except what is listed above and in the HPI.  Objective:    BP (!) 180/110   Pulse 68   Temp 98.8 F (37.1 C)   Ht 5' 8.9" (1.75 m)   Wt 221 lb 6 oz (100.4 kg)   SpO2 96%   BMI 32.79 kg/m   Wt Readings from Last 3 Encounters:  05/12/20 221 lb 6 oz (100.4 kg)  02/06/20 232 lb 8 oz (105.5 kg)  01/03/20 230 lb 3.2 oz (104.4 kg)    Physical Exam Vitals and nursing note reviewed.  Constitutional:      General: He is not in acute distress.    Appearance: He is well-developed.  HENT:     Head: Atraumatic.     Right Ear: Tympanic membrane and external ear normal.     Left Ear: Tympanic membrane and external ear normal.     Nose: Nose normal.     Mouth/Throat:     Mouth: Mucous membranes are moist.     Pharynx: Oropharynx is clear.  Eyes:     General: No scleral icterus.    Conjunctiva/sclera: Conjunctivae normal.     Pupils: Pupils are equal, round, and reactive to light.  Cardiovascular:     Rate and Rhythm: Normal rate and regular rhythm.     Heart sounds: Normal heart sounds. No murmur heard.   Pulmonary:     Effort: Pulmonary effort is normal. No respiratory distress.     Breath sounds: Normal breath sounds.  Abdominal:     General: Bowel sounds are normal. There is no distension.     Palpations: Abdomen is soft. There is no mass.     Tenderness: There is no abdominal tenderness.  There is no guarding.  Genitourinary:    Comments: GU exam declined Musculoskeletal:        General: No swelling or tenderness. Normal range of motion.     Cervical back: Normal range of motion and neck supple.  Skin:    General: Skin is warm and dry.     Findings: No rash.  Neurological:     General: No focal deficit present.     Mental Status: He is alert and oriented to person, place, and time.     Deep Tendon Reflexes: Reflexes are normal and symmetric.  Psychiatric:        Mood and Affect: Mood normal.        Behavior: Behavior normal.        Thought Content: Thought content normal.        Judgment: Judgment normal.     Results for orders placed or performed in visit on 05/12/20  Microscopic Examination   Urine  Result Value Ref Range   WBC, UA None seen 0 - 5 /hpf   RBC 0-2 0 - 2 /hpf   Epithelial Cells (non renal) 0-10 0 - 10 /hpf   Casts Present None seen /lpf   Cast Type Hyaline casts N/A   Mucus, UA Present Not Estab.   Bacteria, UA None seen None seen/Few  UA/M w/rflx Culture, Routine   Specimen: Urine   Urine  Result Value Ref Range   Specific Gravity, UA >1.030 (H) 1.005 - 1.030   pH, UA 5.5 5.0 - 7.5   Color, UA Yellow Yellow   Appearance Ur Clear Clear   Leukocytes,UA Negative Negative   Protein,UA 3+ (A) Negative/Trace   Glucose, UA Negative Negative   Ketones, UA Negative Negative   RBC, UA Trace (A) Negative   Bilirubin, UA Negative Negative   Urobilinogen, Ur  0.2 0.2 - 1.0 mg/dL   Nitrite, UA Negative Negative   Microscopic Examination See below:       Assessment & Plan:   Problem List Items Addressed This Visit      Cardiovascular and Mediastinum   Hypertension - Primary    BP significantly elevated, but has been off medications for several days. Pt planning to get BP monitor this week, will check readings at home and call if readings remaining elevated despite getting back on medications. DASH diet, exercise reviewed      Relevant  Orders   CBC with Differential/Platelet   Comprehensive metabolic panel   UA/M w/rflx Culture, Routine (Completed)   TSH     Respiratory   COPD (chronic obstructive pulmonary disease) (HCC)    Stable off inhalers, continue to monitor. Smoking cessation recommended        Other   Seizures ([place])    Awaiting establishing with Neurology, continue tegretol in meantime      Relevant Medications   carbamazepine (TEGRETOL) 200 MG tablet   Hyperlipidemia    Recheck lipids, adjust as needed. Continue current regimen      Relevant Orders   Lipid Panel w/o Chol/HDL Ratio    Other Visit Diagnoses    Annual physical exam       Screening for prostate cancer       Relevant Orders   PSA       Discussed aspirin prophylaxis for myocardial infarction prevention and decision was made to continue ASA  LABORATORY TESTING:  Health maintenance labs ordered today as discussed above.   The natural history of prostate cancer and ongoing controversy regarding screening and potential treatment outcomes of prostate cancer has been discussed with the patient. The meaning of a false positive PSA and a false negative PSA has been discussed. He indicates understanding of the limitations of this screening test and wishes to proceed with screening PSA testing.   IMMUNIZATIONS:   - Tdap: Tetanus vaccination status reviewed: declined. - Influenza: Up to date  SCREENING: - Colonoscopy: Up to date  Discussed with patient purpose of the colonoscopy is to detect colon cancer at curable precancerous or early stages   PATIENT COUNSELING:    Sexuality: Discussed sexually transmitted diseases, partner selection, use of condoms, avoidance of unintended pregnancy  and contraceptive alternatives.   Advised to avoid cigarette smoking.  I discussed with the patient that most people either abstain from alcohol or drink within safe limits (<=14/week and <=4 drinks/occasion for males, <=7/weeks and <= 3  drinks/occasion for females) and that the risk for alcohol disorders and other health effects rises proportionally with the number of drinks per week and how often a drinker exceeds daily limits.  Discussed cessation/primary prevention of drug use and availability of treatment for abuse.   Diet: Encouraged to adjust caloric intake to maintain  or achieve ideal body weight, to reduce intake of dietary saturated fat and total fat, to limit sodium intake by avoiding high sodium foods and not adding table salt, and to maintain adequate dietary potassium and calcium preferably from fresh fruits, vegetables, and low-fat dairy products.    stressed the importance of regular exercise  Injury prevention: Discussed safety belts, safety helmets, smoke detector, smoking near bedding or upholstery.   Dental health: Discussed importance of regular tooth brushing, flossing, and dental visits.   Follow up plan: NEXT PREVENTATIVE PHYSICAL DUE IN 1 YEAR. Return in about 6 months (around 11/12/2020) for 6 month f/u.

## 2020-05-12 NOTE — Assessment & Plan Note (Signed)
Awaiting establishing with Neurology, continue tegretol in meantime

## 2020-05-12 NOTE — Assessment & Plan Note (Signed)
Stable off inhalers, continue to monitor. Smoking cessation recommended

## 2020-05-12 NOTE — Assessment & Plan Note (Signed)
BP significantly elevated, but has been off medications for several days. Pt planning to get BP monitor this week, will check readings at home and call if readings remaining elevated despite getting back on medications. DASH diet, exercise reviewed

## 2020-05-13 LAB — CBC WITH DIFFERENTIAL/PLATELET
Basophils Absolute: 0.1 x10E3/uL (ref 0.0–0.2)
Basos: 2 %
EOS (ABSOLUTE): 0.1 x10E3/uL (ref 0.0–0.4)
Eos: 3 %
Hematocrit: 43.6 % (ref 37.5–51.0)
Hemoglobin: 14.6 g/dL (ref 13.0–17.7)
Immature Grans (Abs): 0 x10E3/uL (ref 0.0–0.1)
Immature Granulocytes: 0 %
Lymphocytes Absolute: 1.6 x10E3/uL (ref 0.7–3.1)
Lymphs: 32 %
MCH: 31 pg (ref 26.6–33.0)
MCHC: 33.5 g/dL (ref 31.5–35.7)
MCV: 93 fL (ref 79–97)
Monocytes Absolute: 0.6 x10E3/uL (ref 0.1–0.9)
Monocytes: 12 %
Neutrophils Absolute: 2.5 x10E3/uL (ref 1.4–7.0)
Neutrophils: 51 %
Platelets: 172 x10E3/uL (ref 150–450)
RBC: 4.71 x10E6/uL (ref 4.14–5.80)
RDW: 13.4 % (ref 11.6–15.4)
WBC: 4.9 x10E3/uL (ref 3.4–10.8)

## 2020-05-13 LAB — COMPREHENSIVE METABOLIC PANEL
ALT: 26 IU/L (ref 0–44)
AST: 46 IU/L — ABNORMAL HIGH (ref 0–40)
Albumin/Globulin Ratio: 1.6 (ref 1.2–2.2)
Albumin: 4.1 g/dL (ref 4.0–5.0)
Alkaline Phosphatase: 99 IU/L (ref 48–121)
BUN/Creatinine Ratio: 18 (ref 9–20)
BUN: 12 mg/dL (ref 6–24)
Bilirubin Total: 0.4 mg/dL (ref 0.0–1.2)
CO2: 24 mmol/L (ref 20–29)
Calcium: 9.2 mg/dL (ref 8.7–10.2)
Chloride: 99 mmol/L (ref 96–106)
Creatinine, Ser: 0.68 mg/dL — ABNORMAL LOW (ref 0.76–1.27)
GFR calc Af Amer: 129 mL/min/{1.73_m2} (ref >59)
GFR calc non Af Amer: 111 mL/min/{1.73_m2} (ref >59)
Globulin, Total: 2.6 g/dL (ref 1.5–4.5)
Glucose: 99 mg/dL (ref 65–99)
Potassium: 4.3 mmol/L (ref 3.5–5.2)
Sodium: 140 mmol/L (ref 134–144)
Total Protein: 6.7 g/dL (ref 6.0–8.5)

## 2020-05-13 LAB — PSA: Prostate Specific Ag, Serum: 0.8 ng/mL (ref 0.0–4.0)

## 2020-05-13 LAB — LIPID PANEL W/O CHOL/HDL RATIO
Cholesterol, Total: 246 mg/dL — ABNORMAL HIGH (ref 100–199)
HDL: 72 mg/dL (ref >39)
LDL Chol Calc (NIH): 163 mg/dL — ABNORMAL HIGH (ref 0–99)
Triglycerides: 64 mg/dL (ref 0–149)
VLDL Cholesterol Cal: 11 mg/dL (ref 5–40)

## 2020-05-13 LAB — TSH: TSH: 1.4 u[IU]/mL (ref 0.450–4.500)

## 2020-05-14 ENCOUNTER — Other Ambulatory Visit: Payer: Self-pay | Admitting: Family Medicine

## 2020-05-14 MED ORDER — ROSUVASTATIN CALCIUM 20 MG PO TABS
20.0000 mg | ORAL_TABLET | Freq: Every day | ORAL | 1 refills | Status: DC
Start: 2020-05-14 — End: 2020-09-08

## 2020-05-14 NOTE — Progress Notes (Signed)
Patient ID: Jose Dakins., male    DOB: 02-13-1971, 49 y.o.   MRN: [id number]  Jose [name] is a 49 y/o male with a history of hyperlipidemia, HTN, current tobacco/ alcohol use and chronic heart failure.   Echo report from 01/29/2019 reviewed and showed an EF of 60-65%,  Has not been admitted or been in the ED in the last 6 months.   He presents today for a follow-up visit with a chief complaint of minimal shortness of breath upon moderate exertion. He describes this as chronic in nature having been present for several years although does feel like it has improved since he's lost weight. He has associated fatigue and hearing loss along with this. He denies any difficulty sleeping, dizziness, abdominal distention, palpitations, pedal edema, chest pain, cough or weight gain.   Says that his work environment is quite hot but feels like he's staying hydrated well.   Past Medical History:  Diagnosis Date  . Alcohol abuse   . Cervicalgia   . CHF (congestive heart failure) ([place])   . Hyperlipidemia   . Hypertension   . Seizures ([place])    Past Surgical History:  Procedure Laterality Date  . COLONOSCOPY WITH PROPOFOL N/A 01/16/2018   Procedure: COLONOSCOPY WITH PROPOFOL;  Surgeon: Lin Landsman, MD;  Location: [facility] ENDOSCOPY;  Service: Gastroenterology;  Laterality: N/A;  . ESOPHAGOGASTRODUODENOSCOPY (EGD) WITH PROPOFOL N/A 01/16/2018   Procedure: ESOPHAGOGASTRODUODENOSCOPY (EGD) WITH PROPOFOL;  Surgeon: Lin Landsman, MD;  Location: [facility] ENDOSCOPY;  Service: Gastroenterology;  Laterality: N/A;   Family History  Problem Relation Age of Onset  . Diabetes Mother   . Colon cancer Mother   . Hypertension Father   . Multiple sclerosis Father    Social History   Tobacco Use  . Smoking status: Current Every Day Smoker    Packs/day: 0.50    Types: Cigars  . Smokeless tobacco: Former Systems developer  . Tobacco comment: 2 cigars a day  Substance Use Topics  . Alcohol use: Yes    Alcohol/week: 28.0  standard drinks    Types: 7 Cans of beer, 21 Shots of liquor per week    Comment: half pint liquor per day   No Known Allergies  Prior to Admission medications   Medication Sig Start Date End Date Taking? Authorizing Provider  amLODipine (NORVASC) 5 MG tablet Take 1 tablet (5 mg total) by mouth daily. 07/03/19  Yes Darylene Price A, FNP  carbamazepine (TEGRETOL) 200 MG tablet Take 1 tablet (200 mg total) by mouth 2 (two) times daily. 05/12/20  Yes Volney American, PA-C  carvedilol (COREG) 25 MG tablet Take 1 tablet (25 mg total) by mouth 2 (two) times daily with a meal. 07/03/19  Yes Darylene Price A, FNP  furosemide (LASIX) 40 MG tablet Take 1 tablet (40 mg total) by mouth daily. Patient taking differently: Take 20 mg by mouth daily.  07/03/19 05/15/20 Yes Merri Dimaano, Otila Kluver A, FNP  hydrALAZINE (APRESOLINE) 25 MG tablet Take 1 tablet (25 mg total) by mouth 3 (three) times daily. 07/03/19  Yes Darylene Price A, FNP  isosorbide mononitrate (IMDUR) 30 MG 24 hr tablet Take 1 tablet (30 mg total) by mouth daily. 07/03/19  Yes Natonya Finstad, Otila Kluver A, FNP  lisinopril (ZESTRIL) 40 MG tablet Take 1 tablet (40 mg total) by mouth daily. 01/03/20 05/15/20 Yes Ishaq Maffei, Otila Kluver A, FNP  potassium chloride SA (KLOR-CON) 20 MEQ tablet Take 1 tablet (20 mEq total) by mouth daily. 05/12/20  Yes Volney American,  PA-C  rosuvastatin (CRESTOR) 20 MG tablet Take 1 tablet (20 mg total) by mouth daily. 05/14/20  Yes Volney American, PA-C     Review of Systems  Constitutional: Positive for fatigue (intermittent). Negative for appetite change.  HENT: Positive for hearing loss. Negative for congestion and rhinorrhea.   Eyes: Negative.   Respiratory: Positive for shortness of breath (with moderate exertion). Negative for cough and chest tightness.   Cardiovascular: Negative for chest pain, palpitations and leg swelling.  Gastrointestinal: Negative for abdominal distention and abdominal pain.  Endocrine: Negative.    Genitourinary: Negative.   Musculoskeletal: Negative.   Skin: Negative.   Allergic/Immunologic: Negative.   Neurological: Negative for dizziness and light-headedness.  Hematological: Negative for adenopathy. Does not bruise/bleed easily.  Psychiatric/Behavioral: Negative for dysphoric mood and sleep disturbance. The patient is not nervous/anxious.    Vitals:   05/15/20 0831  BP: (!) 134/97  Pulse: 81  Resp: 16  SpO2: 98%  Weight: 218 lb 2 oz (98.9 kg)  Height: 5\' 10"  (1.778 m)   Wt Readings from Last 3 Encounters:  05/15/20 218 lb 2 oz (98.9 kg)  05/12/20 221 lb 6 oz (100.4 kg)  02/06/20 232 lb 8 oz (105.5 kg)   Lab Results  Component Value Date   CREATININE 0.68 (L) 05/12/2020   CREATININE 0.69 02/06/2020   CREATININE 0.71 01/03/2020     Physical Exam Vitals and nursing note reviewed.  Constitutional:      Appearance: Normal appearance.  HENT:     Head: Normocephalic and atraumatic.     Right Ear: Decreased hearing noted.     Left Ear: Decreased hearing noted.  Neck:     Vascular: No JVD.  Cardiovascular:     Rate and Rhythm: Normal rate and regular rhythm.  Pulmonary:     Effort: Pulmonary effort is normal.     Breath sounds: No wheezing, rhonchi or rales.  Abdominal:     General: There is no distension.     Palpations: Abdomen is soft.     Tenderness: There is no abdominal tenderness.  Musculoskeletal:        General: No swelling or tenderness.     Cervical back: Normal range of motion and neck supple.  Skin:    General: Skin is warm and dry.  Neurological:     General: No focal deficit present.     Mental Status: He is alert and oriented to person, place, and time.  Psychiatric:        Mood and Affect: Mood normal.        Behavior: Behavior normal.     Assessment & Plan:  1. Chronic heart failure with preserved ejection fraction- - NYHA class II - euvolemic today - Weighing daily and reminded to call for an overnight weight gain of >2 pounds or  a weekly weight gain of > 5 pounds - weight down 14.6 pounds from last visit 3 months ago - says that he's quite active at work  - not adding salt and is trying to follow a low sodium diet - BNP 07/02/2019 was 32.0 - has received both COVID vaccines  2: HTN- - BP looks good today - saw PCP Orene Desanctis) 05/12/20  - BMP from 05/12/20 reviewed and showed sodium 140, potassium 4.3, creatinine 0.68 and GFR 129  3: Tobacco use-  - says that he's smoking ~ 1-2 cigars daily  - has 1 shot of liquor and maybe 1/2 a beer daily - complete cessation discussed for  2 minutes with him   Patient did not bring his medications nor a list. Each medication was verbally reviewed with the patient and he was encouraged to bring the bottles to every visit to confirm accuracy of list.  Due to HF stability, will not make a return appointment for patient at this time. Advised patient that he could call back at anytime to make another appointment and he was comfortable with this plan.

## 2020-05-15 ENCOUNTER — Telehealth: Payer: Self-pay | Admitting: Family Medicine

## 2020-05-15 ENCOUNTER — Encounter: Payer: Self-pay | Admitting: Family

## 2020-05-15 ENCOUNTER — Ambulatory Visit: Payer: 59 | Attending: Family | Admitting: Family

## 2020-05-15 ENCOUNTER — Other Ambulatory Visit: Payer: Self-pay

## 2020-05-15 VITALS — BP 134/97 | HR 81 | Resp 16 | Ht 70.0 in | Wt 218.1 lb

## 2020-05-15 DIAGNOSIS — I10 Essential (primary) hypertension: Secondary | ICD-10-CM

## 2020-05-15 DIAGNOSIS — I5032 Chronic diastolic (congestive) heart failure: Secondary | ICD-10-CM

## 2020-05-15 DIAGNOSIS — Z79899 Other long term (current) drug therapy: Secondary | ICD-10-CM | POA: Diagnosis not present

## 2020-05-15 DIAGNOSIS — I110 Hypertensive heart disease with heart failure: Secondary | ICD-10-CM | POA: Diagnosis not present

## 2020-05-15 DIAGNOSIS — Z23 Encounter for immunization: Secondary | ICD-10-CM

## 2020-05-15 DIAGNOSIS — F17210 Nicotine dependence, cigarettes, uncomplicated: Secondary | ICD-10-CM | POA: Diagnosis not present

## 2020-05-15 DIAGNOSIS — E785 Hyperlipidemia, unspecified: Secondary | ICD-10-CM | POA: Diagnosis not present

## 2020-05-15 DIAGNOSIS — R0602 Shortness of breath: Secondary | ICD-10-CM | POA: Diagnosis present

## 2020-05-15 DIAGNOSIS — Z7901 Long term (current) use of anticoagulants: Secondary | ICD-10-CM | POA: Insufficient documentation

## 2020-05-15 DIAGNOSIS — Z8249 Family history of ischemic heart disease and other diseases of the circulatory system: Secondary | ICD-10-CM | POA: Diagnosis not present

## 2020-05-15 DIAGNOSIS — F17200 Nicotine dependence, unspecified, uncomplicated: Secondary | ICD-10-CM

## 2020-05-15 NOTE — Patient Instructions (Addendum)
Continue weighing daily and call for an overnight weight gain of > 2 pounds or a weekly weight gain of >5 pounds.   Call us in the future if you'd like to schedule another appointment 

## 2020-05-15 NOTE — Addendum Note (Signed)
Addended by: Merrie Roof E on: 05/15/2020 04:09 PM   Modules accepted: Orders

## 2020-05-15 NOTE — Telephone Encounter (Signed)
Called and left a message letting patient know that the order has been placed and that he can stop by and get it whenever he would like.

## 2020-05-15 NOTE — Telephone Encounter (Signed)
Will place future order and he can stop by and get it anytime

## 2020-05-15 NOTE — Telephone Encounter (Signed)
Patient returned call and was read lab note by Merrie Roof PA  written 05/14/20.  He verbalized understanding of all information. He would like to now have his tetanus vaccine that was offered last visit.

## 2020-05-16 ENCOUNTER — Other Ambulatory Visit: Payer: Self-pay

## 2020-05-16 ENCOUNTER — Emergency Department
Admission: EM | Admit: 2020-05-16 | Discharge: 2020-05-17 | Disposition: A | Discharge status: Home / Self Care | Payer: 59 | Attending: Emergency Medicine | Admitting: Emergency Medicine

## 2020-05-16 ENCOUNTER — Emergency Department: Payer: 59

## 2020-05-16 DIAGNOSIS — J441 Chronic obstructive pulmonary disease with (acute) exacerbation: Secondary | ICD-10-CM | POA: Diagnosis not present

## 2020-05-16 DIAGNOSIS — R079 Chest pain, unspecified: Secondary | ICD-10-CM

## 2020-05-16 DIAGNOSIS — F17290 Nicotine dependence, other tobacco product, uncomplicated: Secondary | ICD-10-CM | POA: Diagnosis not present

## 2020-05-16 DIAGNOSIS — I509 Heart failure, unspecified: Secondary | ICD-10-CM | POA: Insufficient documentation

## 2020-05-16 DIAGNOSIS — F10129 Alcohol abuse with intoxication, unspecified: Secondary | ICD-10-CM | POA: Diagnosis not present

## 2020-05-16 DIAGNOSIS — F10920 Alcohol use, unspecified with intoxication, uncomplicated: Secondary | ICD-10-CM

## 2020-05-16 DIAGNOSIS — I110 Hypertensive heart disease with heart failure: Secondary | ICD-10-CM | POA: Insufficient documentation

## 2020-05-16 DIAGNOSIS — Z79899 Other long term (current) drug therapy: Secondary | ICD-10-CM | POA: Insufficient documentation

## 2020-05-16 LAB — COMPREHENSIVE METABOLIC PANEL
ALT: 24 U/L (ref 0–44)
AST: 34 U/L (ref 15–41)
Albumin: 3.4 g/dL — ABNORMAL LOW (ref 3.5–5.0)
Alkaline Phosphatase: 66 U/L (ref 38–126)
Anion gap: 10 (ref 5–15)
BUN: 18 mg/dL (ref 6–20)
CO2: 22 mmol/L (ref 22–32)
Calcium: 8.3 mg/dL — ABNORMAL LOW (ref 8.9–10.3)
Chloride: 109 mmol/L (ref 98–111)
Creatinine, Ser: 1.05 mg/dL (ref 0.61–1.24)
GFR calc Af Amer: >60 mL/min (ref >60)
GFR calc non Af Amer: >60 mL/min (ref >60)
Glucose, Bld: 97 mg/dL (ref 70–99)
Potassium: 3.8 mmol/L (ref 3.5–5.1)
Sodium: 141 mmol/L (ref 135–145)
Total Bilirubin: 0.5 mg/dL (ref 0.3–1.2)
Total Protein: 6.5 g/dL (ref 6.5–8.1)

## 2020-05-16 LAB — CBC
HCT: 40.4 % (ref 39.0–52.0)
Hemoglobin: 13.6 g/dL (ref 13.0–17.0)
MCH: 31.4 pg (ref 26.0–34.0)
MCHC: 33.7 g/dL (ref 30.0–36.0)
MCV: 93.3 fL (ref 80.0–100.0)
Platelets: 159 10*3/uL (ref 150–400)
RBC: 4.33 MIL/uL (ref 4.22–5.81)
RDW: 14.9 % (ref 11.5–15.5)
WBC: 7.3 10*3/uL (ref 4.0–10.5)
nRBC: 1 % — ABNORMAL HIGH (ref 0.0–0.2)

## 2020-05-16 LAB — BRAIN NATRIURETIC PEPTIDE: B Natriuretic Peptide: 50.9 pg/mL (ref 0.0–100.0)

## 2020-05-16 LAB — TROPONIN I (HIGH SENSITIVITY): Troponin I (High Sensitivity): 15 ng/L (ref <18)

## 2020-05-16 MED ORDER — ASPIRIN 81 MG PO CHEW
324.0000 mg | CHEWABLE_TABLET | Freq: Once | ORAL | Status: AC
  Administered 2020-05-16: 324 mg via ORAL
  Filled 2020-05-16: qty 4

## 2020-05-16 NOTE — ED Provider Notes (Signed)
[facility] Emergency Department Provider Note  ____________________________________________   First MD Initiated Contact with Patient 05/16/20 2303     (approximate)  I have reviewed the triage vital signs and the nursing notes.   HISTORY  Chief Complaint Chest Pain and Shortness of Breath    HPI Jose Bean. is a 49 y.o. male with past medical history of hyperlipidemia hypertension seizures and alcohol abuse presents to the emergency department via EMS secondary to dyspnea and chest pain which patient states began earlier tonight.  On my arrival to the room the patient states "I am doing just fine and I want to leave".  Patient noted to be in bigeminy and trigeminy for EMS and continues to do the same at present on a cardiac monitor.  Patient states that his chest discomfort and dyspnea have resolved.  Patient does admit to EtOH ingestion tonight.       Past Medical History:  Diagnosis Date  . Alcohol abuse   . Cervicalgia   . CHF (congestive heart failure) ([place])   . Hyperlipidemia   . Hypertension   . Seizures [facility])     Patient Active Problem List   Diagnosis Date Noted  . Iron deficiency anemia due to chronic blood loss   . Fall (on) (from) other stairs and steps, initial encounter 01/12/2018  . Family history of colon cancer 06/28/2016  . Seizures ([place]) 04/30/2015  . Hypertension 04/30/2015  . Chronic alcohol abuse 04/30/2015  . Epilepsy ([place]) 04/30/2015  . Tobacco dependence 04/30/2015  . COPD (chronic obstructive pulmonary disease) ([place]) 04/30/2015  . Uncomplicated asthma 26/94/8546  . Hyperlipidemia 04/30/2015  . Lumbago 04/30/2015  . Hypercholesterolemia 04/30/2015  . Chronic pain 04/30/2015  . Nicotine dependence, uncomplicated 27/01/5008  . Hyperlipidemia, unspecified 04/30/2015  . Neck pain 04/22/2013    Past Surgical History:  Procedure Laterality Date  . COLONOSCOPY WITH PROPOFOL N/A 01/16/2018   Procedure: COLONOSCOPY  WITH PROPOFOL;  Surgeon: Lin Landsman, MD;  Location: [facility] ENDOSCOPY;  Service: Gastroenterology;  Laterality: N/A;  . ESOPHAGOGASTRODUODENOSCOPY (EGD) WITH PROPOFOL N/A 01/16/2018   Procedure: ESOPHAGOGASTRODUODENOSCOPY (EGD) WITH PROPOFOL;  Surgeon: Lin Landsman, MD;  Location: [facility] ENDOSCOPY;  Service: Gastroenterology;  Laterality: N/A;    Prior to Admission medications   Medication Sig Start Date End Date Taking? Authorizing Provider  amLODipine (NORVASC) 5 MG tablet Take 1 tablet (5 mg total) by mouth daily. 07/03/19   Alisa Graff, FNP  carbamazepine (TEGRETOL) 200 MG tablet Take 1 tablet (200 mg total) by mouth 2 (two) times daily. 05/12/20   Volney American, PA-C  carvedilol (COREG) 25 MG tablet Take 1 tablet (25 mg total) by mouth 2 (two) times daily with a meal. 07/03/19   Alisa Graff, FNP  furosemide (LASIX) 40 MG tablet Take 1 tablet (40 mg total) by mouth daily. Patient taking differently: Take 20 mg by mouth daily.  07/03/19 05/15/20  Alisa Graff, FNP  hydrALAZINE (APRESOLINE) 25 MG tablet Take 1 tablet (25 mg total) by mouth 3 (three) times daily. 07/03/19   Alisa Graff, FNP  isosorbide mononitrate (IMDUR) 30 MG 24 hr tablet Take 1 tablet (30 mg total) by mouth daily. 07/03/19   Alisa Graff, FNP  lisinopril (ZESTRIL) 40 MG tablet Take 1 tablet (40 mg total) by mouth daily. 01/03/20 05/15/20  Alisa Graff, FNP  potassium chloride SA (KLOR-CON) 20 MEQ tablet Take 1 tablet (20 mEq total) by mouth daily. 05/12/20  Volney American, PA-C  rosuvastatin (CRESTOR) 20 MG tablet Take 1 tablet (20 mg total) by mouth daily. 05/14/20   Volney American, PA-C    Allergies Patient has no known allergies.  Family History  Problem Relation Age of Onset  . Diabetes Mother   . Colon cancer Mother   . Hypertension Father   . Multiple sclerosis Father     Social History Social History   Tobacco Use  . Smoking status: Current Every Day Smoker     Packs/day: 0.50    Types: Cigars  . Smokeless tobacco: Former Systems developer  . Tobacco comment: 2 cigars a day  Vaping Use  . Vaping Use: Never used  Substance Use Topics  . Alcohol use: Yes    Alcohol/week: 28.0 standard drinks    Types: 7 Cans of beer, 21 Shots of liquor per week    Comment: half pint liquor per day  . Drug use: No    Types: Marijuana    Comment: quit back in the late 90's    Review of Systems Constitutional: No fever/chills Eyes: No visual changes. ENT: No sore throat. Cardiovascular: Positive for chest pain Respiratory: Positive for shortness of breath. Gastrointestinal: No abdominal pain.  No nausea, no vomiting.  No diarrhea.  No constipation. Genitourinary: Negative for dysuria. Musculoskeletal: Negative for neck pain.  Negative for back pain. Integumentary: Negative for rash. Neurological: Negative for headaches, focal weakness or numbness. Psychiatric:   ____________________________________________   PHYSICAL EXAM:  VITAL SIGNS: ED Triage Vitals  Enc Vitals Group     BP 05/16/20 2255 106/60     Pulse Rate 05/16/20 2255 92     Resp 05/16/20 2255 16     Temp 05/16/20 2255 97.8 F (36.6 C)     Temp Source 05/16/20 2255 Oral     SpO2 05/16/20 2255 96 %     Weight 05/16/20 2252 99.8 kg (220 lb)     Height 05/16/20 2252 1.778 m (5\' 10" )     Head Circumference --      Peak Flow --      Pain Score 05/16/20 2251 0     Pain Loc --      Pain Edu? --      Excl. in [place]? --     Constitutional: Alert and oriented.  Appears intoxicated Eyes: Conjunctivae are normal.  Head: Atraumatic. Mouth/Throat: Patient is wearing a mask. Neck: No stridor.  No meningeal signs.   Cardiovascular: Normal rate, regular rhythm. Good peripheral circulation. Grossly normal heart sounds. Respiratory: Normal respiratory effort.  No retractions.  Gastrointestinal: Soft and nontender. No distention.  Musculoskeletal: No lower extremity tenderness nor edema. No gross deformities  of extremities. Neurologic:  Normal speech and language. No gross focal neurologic deficits are appreciated.  Skin:  Skin is warm, dry and intact. Psychiatric: Mood and affect are normal. Speech and behavior are normal.  ____________________________________________   LABS (all labs ordered are listed, but only abnormal results are displayed)  Labs Reviewed  CBC - Abnormal; Notable for the following components:      Result Value   nRBC 1.0 (*)    All other components within normal limits  COMPREHENSIVE METABOLIC PANEL - Abnormal; Notable for the following components:   Calcium 8.3 (*)    Albumin 3.4 (*)    All other components within normal limits  ETHANOL - Abnormal; Notable for the following components:   Alcohol, Ethyl (B) 232 (*)    All other components within normal  limits  MAGNESIUM - Abnormal; Notable for the following components:   Magnesium 1.4 (*)    All other components within normal limits  BRAIN NATRIURETIC PEPTIDE  TROPONIN I (HIGH SENSITIVITY)  TROPONIN I (HIGH SENSITIVITY)   ____________________________________________  EKG  ED ECG REPORT I, [place] N Miliani Deike, the attending physician, personally viewed and interpreted this ECG.   Date: 05/16/2020  EKG Time: 10:55 PM  Rate: 93  Rhythm: Sinus rhythm, ventricular bigeminy, LVH  Axis: Leftward axis  Intervals:  ST&T Change: None  ____________________________________________  RADIOLOGY I, Neibert N Vayda Dungee, personally viewed and evaluated these images (plain radiographs) as part of my medical decision making, as well as reviewing the written report by the radiologist.  ED MD interpretation: No acute osseous noted on chest x-ray per radiologist.  Official radiology report(s): DG Chest Port 1 View  Result Date: 05/16/2020 CLINICAL DATA:  Dyspnea, chest pain, tobacco abuse EXAM: PORTABLE CHEST 1 VIEW COMPARISON:  01/28/2019 FINDINGS: Single frontal view of the chest demonstrates a stable cardiac silhouette.  There is chronic interstitial prominence consistent with tobacco abuse. No airspace disease, effusion, or pneumothorax. No acute bony abnormalities. IMPRESSION: 1. Stable exam, no acute process. Electronically Signed   By: Randa Ngo M.D.   On: 05/16/2020 23:29      Procedures   ____________________________________________   INITIAL IMPRESSION / MDM / ASSESSMENT AND PLAN / ED COURSE  As part of my medical decision making, I reviewed the following data within the electronic MEDICAL RECORD NUMBER   49 year old male presented with above-stated history and physical exam a differential diagnosis including but not limited to COPD exacerbation, CHF exacerbation, ACS, alcohol intoxication.  Laboratory data notable for EtOH level of 232.  BNP normal high-sensitivity troponin negative x2.  Chest x-ray revealed no acute process.  Patient was given 2 duo nebs and 125 mg of Solu-Medrol.  Patient without any complaints at present on every reevaluation the patient was requesting to be discharged home.  Patient mother is currently present in the emergency department and he will be discharged in her care with recommendation to follow-up with cardiology on Monday.  ____________________________________________  FINAL CLINICAL IMPRESSION(S) / ED DIAGNOSES  Final diagnoses:  COPD exacerbation ([place])  Chest pain, unspecified type  Alcoholic intoxication without complication ([place])     MEDICATIONS GIVEN DURING THIS VISIT:  Medications  aspirin chewable tablet 324 mg (324 mg Oral Given 05/16/20 2319)  magnesium sulfate IVPB 2 g 50 mL (0 g Intravenous Stopped 05/17/20 0147)  ipratropium-albuterol (DUONEB) 0.5-2.5 (3) MG/3ML nebulizer solution 3 mL (3 mLs Nebulization Given 05/17/20 0129)  ipratropium-albuterol (DUONEB) 0.5-2.5 (3) MG/3ML nebulizer solution 3 mL (3 mLs Nebulization Given 05/17/20 0129)  methylPREDNISolone sodium succinate (SOLU-MEDROL) 125 mg/2 mL injection 125 mg (125 mg Intravenous Given 05/17/20  0126)     ED Discharge Orders    None      *Please note:  Jose Carne. was evaluated in Emergency Department on 05/17/2020 for the symptoms described in the history of present illness. He was evaluated in the context of the global COVID-19 pandemic, which necessitated consideration that the patient might be at risk for infection with the SARS-CoV-2 virus that causes COVID-19. Institutional protocols and algorithms that pertain to the evaluation of patients at risk for COVID-19 are in a state of rapid change based on information released by regulatory bodies including the CDC and federal and state organizations. These policies and algorithms were followed during the patient's care in the ED.  Some ED  evaluations and interventions may be delayed as a result of limited staffing during and after the pandemic.*  Note:  This document was prepared using Dragon voice recognition software and may include unintentional dictation errors.   Gregor Hams, MD 05/17/20 [phone]

## 2020-05-16 NOTE — ED Triage Notes (Signed)
Patient to EMS from home.  Per EMS patient with shortness of breath and chest pain.  Per EMS patient visually having difficulty breathing and having bigemny on heart monitor.

## 2020-05-17 LAB — TROPONIN I (HIGH SENSITIVITY): Troponin I (High Sensitivity): 11 ng/L (ref <18)

## 2020-05-17 LAB — MAGNESIUM: Magnesium: 1.4 mg/dL — ABNORMAL LOW (ref 1.7–2.4)

## 2020-05-17 LAB — ETHANOL: Alcohol, Ethyl (B): 232 mg/dL — ABNORMAL HIGH (ref <10)

## 2020-05-17 MED ORDER — IPRATROPIUM-ALBUTEROL 0.5-2.5 (3) MG/3ML IN SOLN
3.0000 mL | Freq: Once | RESPIRATORY_TRACT | Status: AC
  Administered 2020-05-17: 3 mL via RESPIRATORY_TRACT
  Filled 2020-05-17: qty 3

## 2020-05-17 MED ORDER — MAGNESIUM SULFATE 2 GM/50ML IV SOLN
2.0000 g | Freq: Once | INTRAVENOUS | Status: AC
  Administered 2020-05-17: 2 g via INTRAVENOUS
  Filled 2020-05-17: qty 50

## 2020-05-17 MED ORDER — METHYLPREDNISOLONE SODIUM SUCC 125 MG IJ SOLR
125.0000 mg | Freq: Once | INTRAMUSCULAR | Status: AC
  Administered 2020-05-17: 125 mg via INTRAVENOUS
  Filled 2020-05-17: qty 2

## 2020-05-17 NOTE — ED Notes (Signed)
Patient oxygen saturation 86% on RA while sleeping. Patient placed on 2L [place]. RN will continue to monitor.

## 2020-05-17 NOTE — ED Notes (Signed)
Reviewed discharge instructions, follow-up care with patient. Patient verbalized understanding of all information reviewed. Patient stable, with no distress noted at this time.    

## 2020-05-18 ENCOUNTER — Telehealth: Payer: Self-pay | Admitting: Family Medicine

## 2020-05-18 ENCOUNTER — Other Ambulatory Visit: Payer: Self-pay

## 2020-05-18 MED ORDER — FUROSEMIDE 20 MG PO TABS: 20.0000 mg | ORAL_TABLET | Freq: Every day | ORAL | 1 refills | Status: DC

## 2020-05-18 NOTE — Telephone Encounter (Signed)
Received message from Darylene Price at [facility] regarding patient's recent ED visit  "This mutual patient was just in the ED with chest pain and they would like him to f/u with cardiology. I only see him for his HF and actually just saw him last week and released him from [place] becuase his HF is under control. Can you get him set uup with general cardiology as a follow-up?"   **Please see if pt is ok with me referring him to a general Cardiologist for further workup of his chest pain sxs

## 2020-05-18 NOTE — Telephone Encounter (Signed)
Patient last seen 05/12/20

## 2020-05-19 NOTE — Telephone Encounter (Signed)
Called pt LVM requesting call back

## 2020-05-25 ENCOUNTER — Other Ambulatory Visit: Payer: Self-pay

## 2020-05-25 NOTE — Telephone Encounter (Signed)
Rx refill request. [place]

## 2020-05-26 MED ORDER — FUROSEMIDE 20 MG PO TABS: 20.0000 mg | ORAL_TABLET | Freq: Every day | ORAL | 1 refills | Status: DC

## 2020-07-13 NOTE — Progress Notes (Deleted)
There were no vitals taken for this visit.   Subjective:    Patient ID: Jose Dakins., male    DOB: 1971-09-22, 49 y.o.   MRN: [id number]  HPI: Jose Bring. is a 49 y.o. male presenting for follow up of chronic disease.  No chief complaint on file.  HYPERTENSION Hypertension status: {Blank single:19197::"controlled","uncontrolled","better","worse","exacerbated","stable"}  Satisfied with current treatment? {Blank single:19197::"yes","no"} Duration of hypertension: {Blank single:19197::"chronic","months","years"} BP monitoring frequency:  {Blank single:19197::"not checking","rarely","daily","weekly","monthly","a few times a day","a few times a week","a few times a month"} BP range:  BP medication side effects:  {Blank single:19197::"yes","no"} Medication compliance: {Blank single:19197::"excellent compliance","good compliance","fair compliance","poor compliance"} Previous BP meds:{Blank HALPFXTK:24097::"DZHG","DJMEQASTMH","DQQIWLNLGX/QJJHERDEYC","XKGYJEHU","DJSHFWYOVZ","CHYIFOYDXA/JOIN","OMVEHMCNOB (bystolic)","carvedilol","chlorthalidone","clonidine","diltiazem","exforge HCT","HCTZ","irbesartan (avapro)","labetalol","lisinopril","lisinopril-HCTZ","losartan (cozaar)","methyldopa","nifedipine","olmesartan (benicar)","olmesartan-HCTZ","quinapril","ramipril","spironalactone","tekturna","valsartan","valsartan-HCTZ","verapamil"} Aspirin: {Blank single:19197::"yes","no"} Recurrent headaches: {Blank single:19197::"yes","no"} Visual changes: {Blank single:19197::"yes","no"} Palpitations: {Blank single:19197::"yes","no"} Dyspnea: {Blank single:19197::"yes","no"} Chest pain: {Blank single:19197::"yes","no"} Lower extremity edema: {Blank single:19197::"yes","no"} Dizzy/lightheaded: {Blank single:19197::"yes","no"}  HEART FAILURE Recently discharged from heart failure clinic for stable disease.  Last echocardiogram was 01/2019 - left ventricle has normal systolic function, with an  ejection  fraction of 60-65%, mildly increased left ventricular wall thickness. Duration:  Onset: {Blank single:19197::"sudden","gradual"} Description of breathing discomfort:  Severity: {Blank single:19197::"mild","moderate","severe"} Episode duration:  Frequency: Related to exertion: {Blank single:19197::"yes","no"} Cough: {Blank multiple:19196::"yes","yes productive","yes non-productive","no"} Chest tightness: {Blank single:19197::"yes","no"} Wheezing: {Blank single:19197::"yes","no"} Fevers: {Blank single:19197::"yes","no"} Chest pain: {Blank single:19197::"yes","no"} Palpitations: {Blank single:19197::"yes","no"}  Nausea: {Blank single:19197::"yes","no"} Diaphoresis: {Blank single:19197::"yes","no"} Deconditioning: {Blank single:19197::"yes","no"} Status: {Blank multiple:19196::"better","worse","stable","fluctuating"} Aggravating factors:  Alleviating factors:  Treatments attempted:   COPD COPD status: {Blank single:19197::"controlled","uncontrolled","better","worse","exacerbated","stable"} Satisfied with current treatment?: {Blank single:19197::"yes","no"} Oxygen use: {Blank single:19197::"yes","no"} Dyspnea frequency:  Cough frequency:  Rescue inhaler frequency:   Limitation of activity: {Blank single:19197::"yes","no"} Productive cough:  Last Spirometry:  Pneumovax: {Blank single:19197::"Up to Date","Not up to Date","unknown"} Influenza: {Blank single:19197::"Up to Date","Not up to Date","unknown"}   Seizures - neuro  Alcohol Use  No Known Allergies  Outpatient Encounter Medications as of 07/14/2020  Medication Sig  . amLODipine (NORVASC) 5 MG tablet Take 1 tablet (5 mg total) by mouth daily.  . carbamazepine (TEGRETOL) 200 MG tablet Take 1 tablet (200 mg total) by mouth 2 (two) times daily.  . carvedilol (COREG) 25 MG tablet Take 1 tablet (25 mg total) by mouth 2 (two) times daily with a meal.  . furosemide (LASIX) 20 MG tablet Take 1 tablet (20 mg total) by mouth daily.    . furosemide (LASIX) 40 MG tablet Take 1 tablet (40 mg total) by mouth daily. (Patient taking differently: Take 20 mg by mouth daily. )  . hydrALAZINE (APRESOLINE) 25 MG tablet Take 1 tablet (25 mg total) by mouth 3 (three) times daily.  . isosorbide mononitrate (IMDUR) 30 MG 24 hr tablet Take 1 tablet (30 mg total) by mouth daily.  [place] Kitchen lisinopril (ZESTRIL) 40 MG tablet Take 1 tablet (40 mg total) by mouth daily.  . potassium chloride SA (KLOR-CON) 20 MEQ tablet Take 1 tablet (20 mEq total) by mouth daily.  . rosuvastatin (CRESTOR) 20 MG tablet Take 1 tablet (20 mg total) by mouth daily.   No facility-administered encounter medications on file as of 07/14/2020.   Patient Active Problem List   Diagnosis Date Noted  . Iron deficiency anemia due to chronic blood loss   . Fall (on) (from) other stairs and steps, initial encounter 01/12/2018  . Family history of colon cancer 06/28/2016  . Seizures ([place]) 04/30/2015  . Hypertension 04/30/2015  . Chronic alcohol abuse 04/30/2015  . Epilepsy ([place]) 04/30/2015  . Tobacco dependence 04/30/2015  . COPD (  chronic obstructive pulmonary disease) ([place]) 04/30/2015  . Uncomplicated asthma 41/96/2229  . Hyperlipidemia 04/30/2015  . Lumbago 04/30/2015  . Hypercholesterolemia 04/30/2015  . Chronic pain 04/30/2015  . Nicotine dependence, uncomplicated 79/89/2119  . Hyperlipidemia, unspecified 04/30/2015  . Neck pain 04/22/2013   Past Medical History:  Diagnosis Date  . Alcohol abuse   . Cervicalgia   . CHF (congestive heart failure) ([place])   . Hyperlipidemia   . Hypertension   . Seizures ([place])    Relevant past medical, surgical, family and social history reviewed and updated as indicated. Interim medical history since our last visit reviewed. Allergies and medications reviewed and updated.  Review of Systems  Per HPI unless specifically indicated above     Objective:    There were no vitals taken for this visit.  Wt Readings from Last 3  Encounters:  05/16/20 220 lb (99.8 kg)  05/15/20 218 lb 2 oz (98.9 kg)  05/12/20 221 lb 6 oz (100.4 kg)    Physical Exam  Results for orders placed or performed during the hospital encounter of 05/16/20  CBC  Result Value Ref Range   WBC 7.3 4.0 - 10.5 K/uL   RBC 4.33 4.22 - 5.81 MIL/uL   Hemoglobin 13.6 13.0 - 17.0 g/dL   HCT 40.4 39 - 52 %   MCV 93.3 80.0 - 100.0 fL   MCH 31.4 26.0 - 34.0 pg   MCHC 33.7 30.0 - 36.0 g/dL   RDW 14.9 11.5 - 15.5 %   Platelets 159 150 - 400 K/uL   nRBC 1.0 (H) 0.0 - 0.2 %  Comprehensive metabolic panel  Result Value Ref Range   Sodium 141 135 - 145 mmol/L   Potassium 3.8 3.5 - 5.1 mmol/L   Chloride 109 98 - 111 mmol/L   CO2 22 22 - 32 mmol/L   Glucose, Bld 97 70 - 99 mg/dL   BUN 18 6 - 20 mg/dL   Creatinine, Ser 1.05 0.61 - 1.24 mg/dL   Calcium 8.3 (L) 8.9 - 10.3 mg/dL   Total Protein 6.5 6.5 - 8.1 g/dL   Albumin 3.4 (L) 3.5 - 5.0 g/dL   AST 34 15 - 41 U/L   ALT 24 0 - 44 U/L   Alkaline Phosphatase 66 38 - 126 U/L   Total Bilirubin 0.5 0.3 - 1.2 mg/dL   GFR calc non Af Amer >60 >60 mL/min   GFR calc Af Amer >60 >60 mL/min   Anion gap 10 5 - 15  Brain natriuretic peptide  Result Value Ref Range   B Natriuretic Peptide 50.9 0.0 - 100.0 pg/mL  Ethanol  Result Value Ref Range   Alcohol, Ethyl (B) 232 (H) <10 mg/dL  Magnesium  Result Value Ref Range   Magnesium 1.4 (L) 1.7 - 2.4 mg/dL  Troponin I (High Sensitivity)  Result Value Ref Range   Troponin I (High Sensitivity) 15 <18 ng/L  Troponin I (High Sensitivity)  Result Value Ref Range   Troponin I (High Sensitivity) 11 <18 ng/L      Assessment & Plan:   Problem List Items Addressed This Visit    None       Follow up plan: No follow-ups on file.

## 2020-07-14 ENCOUNTER — Ambulatory Visit: Payer: 59 | Admitting: Nurse Practitioner

## 2020-07-14 ENCOUNTER — Ambulatory Visit: Payer: 59 | Admitting: Family Medicine

## 2020-09-01 ENCOUNTER — Ambulatory Visit: Payer: 59 | Admitting: Nurse Practitioner

## 2020-09-07 ENCOUNTER — Ambulatory Visit (INDEPENDENT_AMBULATORY_CARE_PROVIDER_SITE_OTHER): Payer: 59 | Admitting: Nurse Practitioner

## 2020-09-07 ENCOUNTER — Other Ambulatory Visit: Payer: Self-pay

## 2020-09-07 ENCOUNTER — Encounter: Payer: Self-pay | Admitting: Nurse Practitioner

## 2020-09-07 VITALS — BP 183/83 | HR 84 | Temp 99.1°F | Ht 70.5 in | Wt 212.8 lb

## 2020-09-07 DIAGNOSIS — F1010 Alcohol abuse, uncomplicated: Secondary | ICD-10-CM | POA: Diagnosis not present

## 2020-09-07 DIAGNOSIS — I459 Conduction disorder, unspecified: Secondary | ICD-10-CM

## 2020-09-07 DIAGNOSIS — G40909 Epilepsy, unspecified, not intractable, without status epilepticus: Secondary | ICD-10-CM

## 2020-09-07 DIAGNOSIS — I10 Essential (primary) hypertension: Secondary | ICD-10-CM

## 2020-09-07 DIAGNOSIS — I5032 Chronic diastolic (congestive) heart failure: Secondary | ICD-10-CM | POA: Diagnosis not present

## 2020-09-07 NOTE — Progress Notes (Signed)
BP (!) 183/83   Pulse 84   Temp 99.1 F (37.3 C) (Oral)   Ht 5' 10.5" (1.791 m)   Wt 212 lb 12.8 oz (96.5 kg)   SpO2 96%   BMI 30.10 kg/m    Subjective:    Patient ID: Jose Dakins., male    DOB: [birth year], 50 y.o.   MRN: [id number]  HPI: Jose Wirick. is a 49 y.o. male presenting for medication refill.  Chief Complaint  Patient presents with  . Medication Refill    Patient states that he has been out of his BP medicaiton for 4 days.  . Medication review   HYPERTENSION Patient was taking amlodipine 5 mg and lisinopril 40 mg daily prior to running out about 5 days ago.  Has been followed by the CHF clinic in the past, however appears he was discharged in July due to good control of heart failure symptoms. Hypertension status: uncontrolled  Satisfied with current treatment? yes Duration of hypertension: chronic BP monitoring frequency:  not checking BP range:  BP medication side effects:  no Medication compliance: fair compliance Previous BP meds: lisinopril, amlodipine Aspirin: no Recurrent headaches: no Visual changes: no Palpitations: no Dyspnea: no Chest pain: no Lower extremity edema: no Dizzy/lightheaded: no   EPILEPSY Has not had a seizure since he can remember.  Has also run out of carbamazepine.  Was not able to establish care with a Neurologist but plans to in near future.  ALCOHOL ABUSE Has been drinking 1 pint per day and 1/2 beer per day for more than 20 years.  Interested in cutting back on drinking.    No Known Allergies  Outpatient Encounter Medications as of 09/07/2020  Medication Sig  . amLODipine (NORVASC) 5 MG tablet Take 1 tablet (5 mg total) by mouth daily.  . carbamazepine (TEGRETOL) 200 MG tablet Take 1 tablet (200 mg total) by mouth 2 (two) times daily.  . carvedilol (COREG) 25 MG tablet Take 1 tablet (25 mg total) by mouth 2 (two) times daily with a meal.  . furosemide (LASIX) 20 MG tablet Take 1 tablet (20 mg total) by mouth daily.   . hydrALAZINE (APRESOLINE) 25 MG tablet Take 1 tablet (25 mg total) by mouth 3 (three) times daily.  . isosorbide mononitrate (IMDUR) 30 MG 24 hr tablet Take 1 tablet (30 mg total) by mouth daily.  . potassium chloride SA (KLOR-CON) 20 MEQ tablet Take 1 tablet (20 mEq total) by mouth daily.  . rosuvastatin (CRESTOR) 20 MG tablet Take 1 tablet (20 mg total) by mouth daily.  . furosemide (LASIX) 40 MG tablet Take 1 tablet (40 mg total) by mouth daily. (Patient taking differently: Take 20 mg by mouth daily. )  . lisinopril (ZESTRIL) 40 MG tablet Take 1 tablet (40 mg total) by mouth daily.   No facility-administered encounter medications on file as of 09/07/2020.   Patient Active Problem List   Diagnosis Date Noted  . Chronic diastolic congestive heart failure ([place]) 09/08/2020  . Iron deficiency anemia due to chronic blood loss   . Family history of colon cancer 06/28/2016  . Seizures ([place]) 04/30/2015  . Hypertension 04/30/2015  . Chronic alcohol abuse 04/30/2015  . Epilepsy ([place]) 04/30/2015  . Tobacco dependence 04/30/2015  . COPD (chronic obstructive pulmonary disease) ([place]) 04/30/2015  . Uncomplicated asthma 89/38/1017  . Lumbago 04/30/2015  . Hypercholesterolemia 04/30/2015  . Chronic pain 04/30/2015  . Neck pain 04/22/2013   Past Medical History:  Diagnosis Date  .  Alcohol abuse   . Cervicalgia   . CHF (congestive heart failure) ([place])   . Hyperlipidemia   . Hypertension   . Seizures ([place])    Relevant past medical, surgical, family and social history reviewed and updated as indicated. Interim medical history since our last visit reviewed. Allergies and medications reviewed and updated.  Review of Systems  Constitutional: Negative.  Negative for chills and diaphoresis.  Eyes: Negative.  Negative for visual disturbance.  Respiratory: Negative.  Negative for chest tightness, shortness of breath and wheezing.   Cardiovascular: Negative.  Negative for chest pain, palpitations  and leg swelling.  Gastrointestinal: Negative.  Negative for nausea and vomiting.  Skin: Negative.  Negative for color change.  Neurological: Negative.  Negative for dizziness, light-headedness and headaches.  Psychiatric/Behavioral: Negative.  Negative for confusion and sleep disturbance.    Per HPI unless specifically indicated above     Objective:    BP (!) 183/83   Pulse 84   Temp 99.1 F (37.3 C) (Oral)   Ht 5' 10.5" (1.791 m)   Wt 212 lb 12.8 oz (96.5 kg)   SpO2 96%   BMI 30.10 kg/m   Wt Readings from Last 3 Encounters:  09/07/20 212 lb 12.8 oz (96.5 kg)  05/16/20 220 lb (99.8 kg)  05/15/20 218 lb 2 oz (98.9 kg)    Physical Exam Vitals and nursing note reviewed.  Constitutional:      General: He is not in acute distress.    Appearance: Normal appearance. He is not toxic-appearing.  Eyes:     General: No scleral icterus.    Extraocular Movements: Extraocular movements intact.  Cardiovascular:     Rate and Rhythm: Normal rate. Rhythm regularly irregular.     Heart sounds: Normal heart sounds.     Comments: Heart sounds skip a beat every 5-6th beat Pulmonary:     Effort: Pulmonary effort is normal. No respiratory distress.     Breath sounds: Normal breath sounds. No wheezing, rhonchi or rales.  Abdominal:     General: Abdomen is flat. Bowel sounds are normal. There is no distension.     Palpations: Abdomen is soft.     Tenderness: There is no abdominal tenderness.  Musculoskeletal:        General: Normal range of motion.     Cervical back: Normal range of motion.     Right lower leg: No edema.     Left lower leg: No edema.  Skin:    General: Skin is warm and dry.     Capillary Refill: Capillary refill takes less than 2 seconds.     Coloration: Skin is not jaundiced or pale.     Findings: No erythema.  Neurological:     Mental Status: He is alert and oriented to person, place, and time.     Motor: No weakness.     Gait: Gait normal.  Psychiatric:         Mood and Affect: Mood normal.        Behavior: Behavior normal.        Thought Content: Thought content normal.        Judgment: Judgment normal.       Assessment & Plan:   Problem List Items Addressed This Visit      Cardiovascular and Mediastinum   Hypertension    Chronic, uncontrolled. Blood pressure quite above goal today in clinic-due to being off medications for several days. Will restart lisinopril, hydrochlorothiazide, hydralazine and follow-up in about  3 weeks for recheck with labs. With any chest pain, shortness of breath, or dizziness in the meantime, go to ER.      Chronic diastolic congestive heart failure (HCC) - Primary    Chronic, ongoing. Last echo in 01/2019 showed ejection fraction 60-65%. Was discharged from her failure clinic due to stability of disease in July of this year. Will give refills of carvedilol, Lasix, Imdur, and potassium. Encouraged to measure weight daily and follow low-salt diet. If weight gain of greater than 2 pounds in 1 day or 5 pounds in 1 week, return to clinic. Follow-up in 3 weeks.        Nervous and Auditory   Epilepsy (HCC)    Chronic, ongoing. Was previously taking carbamazepine 200 mg twice daily for same. Has not [place] with neurology-will place referral today. Discussed importance of establishing with a neurologist for ongoing evaluation and management of seizure disorder.        Other   Chronic alcohol abuse    Chronic, ongoing. Discussed cutting back on alcohol use. For him, probably not safe to quit cold [place] due to history of epilepsy. Encourage patient to cut back by half per week. Offered resources such as counseling and AA, patient declines today.       Other Visit Diagnoses    Skipped heart beats        Incidentally heard on examination today. Previous EKG in July 2021 showed ventricular bigeminy-this is most likely the cause. EKG today encouraged, however patient declined due to cost. We'll follow up in 3 weeks and try  to obtain EKG then. With any chest pain or shortness of breath, go to ER.  Follow up plan: Return in about 3 weeks (around 09/28/2020) for BP, cholesterol f/u.

## 2020-09-07 NOTE — Patient Instructions (Signed)
DASH Eating Plan DASH stands for "Dietary Approaches to Stop Hypertension." The DASH eating plan is a healthy eating plan that has been shown to reduce high blood pressure (hypertension). It may also reduce your risk for type 2 diabetes, heart disease, and stroke. The DASH eating plan may also help with weight loss. What are tips for following this plan?  General guidelines  Avoid eating more than 2,300 mg (milligrams) of salt (sodium) a day. If you have hypertension, you may need to reduce your sodium intake to 1,500 mg a day.  Limit alcohol intake to no more than 1 drink a day for nonpregnant women and 2 drinks a day for men. One drink equals 12 oz of beer, 5 oz of wine, or 1 oz of hard liquor.  Work with your health care provider to maintain a healthy body weight or to lose weight. Ask what an ideal weight is for you.  Get at least 30 minutes of exercise that causes your heart to beat faster (aerobic exercise) most days of the week. Activities may include walking, swimming, or biking.  Work with your health care provider or diet and nutrition specialist (dietitian) to adjust your eating plan to your individual calorie needs. Reading food labels   Check food labels for the amount of sodium per serving. Choose foods with less than 5 percent of the Daily Value of sodium. Generally, foods with less than 300 mg of sodium per serving fit into this eating plan.  To find whole grains, look for the word "whole" as the first word in the ingredient list. Shopping  Buy products labeled as "low-sodium" or "no salt added."  Buy fresh foods. Avoid canned foods and premade or frozen meals. Cooking  Avoid adding salt when cooking. Use salt-free seasonings or herbs instead of table salt or sea salt. Check with your health care provider or pharmacist before using salt substitutes.  Do not fry foods. Cook foods using healthy methods such as baking, boiling, grilling, and broiling instead.  Cook with  heart-healthy oils, such as olive, canola, soybean, or sunflower oil. Meal planning  Eat a balanced diet that includes: ? 5 or more servings of fruits and vegetables each day. At each meal, try to fill half of your plate with fruits and vegetables. ? Up to 6-8 servings of whole grains each day. ? Less than 6 oz of lean meat, poultry, or fish each day. A 3-oz serving of meat is about the same size as a deck of cards. One egg equals 1 oz. ? 2 servings of low-fat dairy each day. ? A serving of nuts, seeds, or beans 5 times each week. ? Heart-healthy fats. Healthy fats called Omega-3 fatty acids are found in foods such as flaxseeds and coldwater fish, like sardines, salmon, and mackerel.  Limit how much you eat of the following: ? Canned or prepackaged foods. ? Food that is high in trans fat, such as fried foods. ? Food that is high in saturated fat, such as fatty meat. ? Sweets, desserts, sugary drinks, and other foods with added sugar. ? Full-fat dairy products.  Do not salt foods before eating.  Try to eat at least 2 vegetarian meals each week.  Eat more home-cooked food and less restaurant, buffet, and fast food.  When eating at a restaurant, ask that your food be prepared with less salt or no salt, if possible. What foods are recommended? The items listed may not be a complete list. Talk with your dietitian about   what dietary choices are best for you. Grains Whole-grain or whole-wheat bread. Whole-grain or whole-wheat pasta. Brown rice. Oatmeal. Quinoa. Bulgur. Whole-grain and low-sodium cereals. Pita bread. Low-fat, low-sodium crackers. Whole-wheat flour tortillas. Vegetables Fresh or frozen vegetables (raw, steamed, roasted, or grilled). Low-sodium or reduced-sodium tomato and vegetable juice. Low-sodium or reduced-sodium tomato sauce and tomato paste. Low-sodium or reduced-sodium canned vegetables. Fruits All fresh, dried, or frozen fruit. Canned fruit in natural juice (without  added sugar). Meat and other protein foods Skinless chicken or turkey. Ground chicken or turkey. Pork with fat trimmed off. Fish and seafood. Egg whites. Dried beans, peas, or lentils. Unsalted nuts, nut butters, and seeds. Unsalted canned beans. Lean cuts of beef with fat trimmed off. Low-sodium, lean deli meat. Dairy Low-fat (1%) or fat-free (skim) milk. Fat-free, low-fat, or reduced-fat cheeses. Nonfat, low-sodium ricotta or cottage cheese. Low-fat or nonfat yogurt. Low-fat, low-sodium cheese. Fats and oils Soft margarine without trans fats. Vegetable oil. Low-fat, reduced-fat, or light mayonnaise and salad dressings (reduced-sodium). Canola, safflower, olive, soybean, and sunflower oils. Avocado. Seasoning and other foods Herbs. Spices. Seasoning mixes without salt. Unsalted popcorn and pretzels. Fat-free sweets. What foods are not recommended? The items listed may not be a complete list. Talk with your dietitian about what dietary choices are best for you. Grains Baked goods made with fat, such as croissants, muffins, or some breads. Dry pasta or rice meal packs. Vegetables Creamed or fried vegetables. Vegetables in a cheese sauce. Regular canned vegetables (not low-sodium or reduced-sodium). Regular canned tomato sauce and paste (not low-sodium or reduced-sodium). Regular tomato and vegetable juice (not low-sodium or reduced-sodium). Pickles. Olives. Fruits Canned fruit in a light or heavy syrup. Fried fruit. Fruit in cream or butter sauce. Meat and other protein foods Fatty cuts of meat. Ribs. Fried meat. Bacon. Sausage. Bologna and other processed lunch meats. Salami. Fatback. Hotdogs. Bratwurst. Salted nuts and seeds. Canned beans with added salt. Canned or smoked fish. Whole eggs or egg yolks. Chicken or turkey with skin. Dairy Whole or 2% milk, cream, and half-and-half. Whole or full-fat cream cheese. Whole-fat or sweetened yogurt. Full-fat cheese. Nondairy creamers. Whipped toppings.  Processed cheese and cheese spreads. Fats and oils Butter. Stick margarine. Lard. Shortening. Ghee. Bacon fat. Tropical oils, such as coconut, palm kernel, or palm oil. Seasoning and other foods Salted popcorn and pretzels. Onion salt, garlic salt, seasoned salt, table salt, and sea salt. Worcestershire sauce. Tartar sauce. Barbecue sauce. Teriyaki sauce. Soy sauce, including reduced-sodium. Steak sauce. Canned and packaged gravies. Fish sauce. Oyster sauce. Cocktail sauce. Horseradish that you find on the shelf. Ketchup. Mustard. Meat flavorings and tenderizers. Bouillon cubes. Hot sauce and Tabasco sauce. Premade or packaged marinades. Premade or packaged taco seasonings. Relishes. Regular salad dressings. Where to find more information:  National Heart, Lung, and Blood Institute: www.nhlbi.nih.gov  American Heart Association: www.heart.org Summary  The DASH eating plan is a healthy eating plan that has been shown to reduce high blood pressure (hypertension). It may also reduce your risk for type 2 diabetes, heart disease, and stroke.  With the DASH eating plan, you should limit salt (sodium) intake to 2,300 mg a day. If you have hypertension, you may need to reduce your sodium intake to 1,500 mg a day.  When on the DASH eating plan, aim to eat more fresh fruits and vegetables, whole grains, lean proteins, low-fat dairy, and heart-healthy fats.  Work with your health care provider or diet and nutrition specialist (dietitian) to adjust your eating plan to your   individual calorie needs. This information is not intended to replace advice given to you by your health care provider. Make sure you discuss any questions you have with your health care provider. Document Revised: 10/06/2017 Document Reviewed: 10/17/2016 Elsevier Patient Education  2020 Elsevier Inc.  

## 2020-09-08 DIAGNOSIS — I5032 Chronic diastolic (congestive) heart failure: Secondary | ICD-10-CM | POA: Insufficient documentation

## 2020-09-08 MED ORDER — ROSUVASTATIN CALCIUM 20 MG PO TABS: 20.0000 mg | ORAL_TABLET | Freq: Every day | ORAL | 0 refills | Status: DC

## 2020-09-08 MED ORDER — POTASSIUM CHLORIDE CRYS ER 20 MEQ PO TBCR: 20.0000 meq | EXTENDED_RELEASE_TABLET | Freq: Every day | ORAL | 0 refills | Status: DC

## 2020-09-08 MED ORDER — CARVEDILOL 25 MG PO TABS: 25.0000 mg | ORAL_TABLET | Freq: Two times a day (BID) | ORAL | 0 refills | Status: DC

## 2020-09-08 MED ORDER — AMLODIPINE BESYLATE 5 MG PO TABS: 5.0000 mg | ORAL_TABLET | Freq: Every day | ORAL | 0 refills | Status: DC

## 2020-09-08 MED ORDER — FUROSEMIDE 20 MG PO TABS: 20.0000 mg | ORAL_TABLET | Freq: Every day | ORAL | 0 refills | Status: DC

## 2020-09-08 MED ORDER — ISOSORBIDE MONONITRATE ER 30 MG PO TB24: 30.0000 mg | ORAL_TABLET | Freq: Every day | ORAL | 0 refills | Status: DC

## 2020-09-08 MED ORDER — LISINOPRIL 40 MG PO TABS: 40.0000 mg | ORAL_TABLET | Freq: Every day | ORAL | 0 refills | Status: DC

## 2020-09-08 MED ORDER — HYDRALAZINE HCL 25 MG PO TABS: 25.0000 mg | ORAL_TABLET | Freq: Three times a day (TID) | ORAL | 0 refills | Status: DC

## 2020-09-08 MED ORDER — CARBAMAZEPINE 200 MG PO TABS: 200.0000 mg | ORAL_TABLET | Freq: Two times a day (BID) | ORAL | 0 refills | Status: DC

## 2020-09-08 NOTE — Assessment & Plan Note (Signed)
Chronic, ongoing. Discussed cutting back on alcohol use. For him, probably not safe to quit cold [place] due to history of epilepsy. Encourage patient to cut back by half per week. Offered resources such as counseling and AA, patient declines today.

## 2020-09-08 NOTE — Assessment & Plan Note (Signed)
Chronic, ongoing. Was previously taking carbamazepine 200 mg twice daily for same. Has not [place] with neurology-will place referral today. Discussed importance of establishing with a neurologist for ongoing evaluation and management of seizure disorder.

## 2020-09-08 NOTE — Assessment & Plan Note (Signed)
Chronic, ongoing. Last echo in 01/2019 showed ejection fraction 60-65%. Was discharged from her failure clinic due to stability of disease in July of this year. Will give refills of carvedilol, Lasix, Imdur, and potassium. Encouraged to measure weight daily and follow low-salt diet. If weight gain of greater than 2 pounds in 1 day or 5 pounds in 1 week, return to clinic. Follow-up in 3 weeks.

## 2020-09-08 NOTE — Assessment & Plan Note (Signed)
Chronic, uncontrolled. Blood pressure quite above goal today in clinic-due to being off medications for several days. Will restart lisinopril, hydrochlorothiazide, hydralazine and follow-up in about 3 weeks for recheck with labs. With any chest pain, shortness of breath, or dizziness in the meantime, go to ER.

## 2020-09-29 ENCOUNTER — Ambulatory Visit: Payer: 59 | Admitting: Nurse Practitioner

## 2020-10-06 ENCOUNTER — Ambulatory Visit: Payer: 59 | Admitting: Nurse Practitioner

## 2020-10-19 ENCOUNTER — Ambulatory Visit: Payer: 59 | Admitting: Unknown Physician Specialty

## 2021-01-07 ENCOUNTER — Ambulatory Visit: Payer: 59 | Admitting: Gastroenterology

## 2021-01-07 ENCOUNTER — Encounter: Payer: Self-pay | Admitting: Gastroenterology

## 2021-02-05 ENCOUNTER — Other Ambulatory Visit: Payer: Self-pay | Admitting: Nurse Practitioner

## 2021-02-05 MED ORDER — HYDRALAZINE HCL 25 MG PO TABS: 25.0000 mg | ORAL_TABLET | Freq: Three times a day (TID) | ORAL | 0 refills | Status: DC

## 2021-02-05 MED ORDER — FUROSEMIDE 20 MG PO TABS: 20.0000 mg | ORAL_TABLET | Freq: Every day | ORAL | 0 refills | Status: DC

## 2021-02-05 MED ORDER — ROSUVASTATIN CALCIUM 20 MG PO TABS: 20.0000 mg | ORAL_TABLET | Freq: Every day | ORAL | 0 refills | Status: DC

## 2021-02-05 MED ORDER — CARBAMAZEPINE 200 MG PO TABS: 200.0000 mg | ORAL_TABLET | Freq: Two times a day (BID) | ORAL | 0 refills | Status: DC

## 2021-02-05 MED ORDER — CARVEDILOL 25 MG PO TABS: 25.0000 mg | ORAL_TABLET | Freq: Two times a day (BID) | ORAL | 0 refills | Status: DC

## 2021-02-05 MED ORDER — ISOSORBIDE MONONITRATE ER 30 MG PO TB24: 30.0000 mg | ORAL_TABLET | Freq: Every day | ORAL | 0 refills | Status: DC

## 2021-02-05 MED ORDER — LISINOPRIL 40 MG PO TABS: 40.0000 mg | ORAL_TABLET | Freq: Every day | ORAL | 0 refills | Status: DC

## 2021-02-05 MED ORDER — AMLODIPINE BESYLATE 5 MG PO TABS: 5.0000 mg | ORAL_TABLET | Freq: Every day | ORAL | 0 refills | Status: DC

## 2021-02-05 MED ORDER — POTASSIUM CHLORIDE CRYS ER 20 MEQ PO TBCR: 20.0000 meq | EXTENDED_RELEASE_TABLET | Freq: Every day | ORAL | 0 refills | Status: DC

## 2021-02-05 NOTE — Telephone Encounter (Signed)
Requested medication (s) are due for refill today: Carbamazepine, yes  Requested medication (s) are on the active medication list: yes  Last refill: 09/08/20  Future visit scheduled: yes  Notes to clinic: not delegated  Requested medication (s) are due for refill today: Lisinopril,  yes  Requested medication (s) are on the active medication list: yes  Last refill:  09/08/20  Future visit scheduled: yes  Notes to clinic:  RX expired        Requested Prescriptions  Pending Prescriptions Disp Refills   carbamazepine (TEGRETOL) 200 MG tablet 180 tablet 0    Sig: Take 1 tablet (200 mg total) by mouth 2 (two) times daily.      Not Delegated - Neurology:  Anticonvulsants - carbamazepine Failed - 02/05/2021  8:32 AM      Failed - This refill cannot be delegated      Failed - AST in normal range and within 90 days    AST  Date Value Ref Range Status  05/16/2020 34 15 - 41 U/L Final          Failed - ALT in normal range and within 90 days    ALT  Date Value Ref Range Status  05/16/2020 24 0 - 44 U/L Final          Failed - Carbamazepine (serum) in normal range and within 360 days    Carbamazepine (Tegretol), S  Date Value Ref Range Status  10/18/2019 6.3 4.0 - 12.0 ug/mL Final    Comment:             In conjunction with other antiepileptic drugs                                Therapeutic  4.0 -  8.0                                Toxicity     9.0 - 12.0                                    Carbamazepine alone                                Therapeutic  8.0 - 12.0                                 Detection Limit =  2.0                           <2.0 indicated None Detected           Failed - WBC in normal range and within 90 days    WBC  Date Value Ref Range Status  05/16/2020 7.3 4.0 - 10.5 K/uL Final          Failed - PLT in normal range and within 90 days    Platelets  Date Value Ref Range Status  05/16/2020 159 150 - 400 K/uL Final  05/12/2020 172 150 -  450 x10E3/uL Final          Failed - HGB in normal range and within 90 days  Hemoglobin  Date Value Ref Range Status  05/16/2020 13.6 13.0 - 17.0 g/dL Final  05/12/2020 14.6 13.0 - 17.7 g/dL Final          Failed - Na in normal range and within 90 days    Sodium  Date Value Ref Range Status  05/16/2020 141 135 - 145 mmol/L Final  05/12/2020 140 134 - 144 mmol/L Final          Failed - HCT in normal range and within 90 days    HCT  Date Value Ref Range Status  05/16/2020 40.4 39.0 - 52.0 % Final   Hematocrit  Date Value Ref Range Status  05/12/2020 43.6 37.5 - 51.0 % Final          Passed - Valid encounter within last 12 months    Recent Outpatient Visits           5 months ago Chronic diastolic congestive heart failure ([place])   [facility] Eulogio Bear, NP   8 months ago Essential hypertension   [facility] Volney American, [place]   1 year ago Essential hypertension   [place], [place], [place]   2 years ago Acute diastolic congestive heart failure [facility])   [place] Crissman, Jeannette How, MD   3 years ago ETOH abuse   [facility] Kathrine Haddock, NP       Future Appointments             In 1 week Jon Billings, NP [facility]               lisinopril (ZESTRIL) 40 MG tablet 90 tablet 0    Sig: Take 1 tablet (40 mg total) by mouth daily.      Cardiovascular:  ACE Inhibitors Failed - 02/05/2021  8:32 AM      Failed - Cr in normal range and within 180 days    Creatinine, Ser  Date Value Ref Range Status  05/16/2020 1.05 0.61 - 1.24 mg/dL Final          Failed - K in normal range and within 180 days    Potassium  Date Value Ref Range Status  05/16/2020 3.8 3.5 - 5.1 mmol/L Final          Failed - Last BP in normal range    BP Readings from Last 1 Encounters:  09/07/20 (!) 183/83          Passed - Patient is not pregnant       Passed - Valid encounter within last 6 months    Recent Outpatient Visits           5 months ago Chronic diastolic congestive heart failure ([place])   [facility] Eulogio Bear, NP   8 months ago Essential hypertension   [facility] Vol[place], [place]   1 year ago Essential hypertension   [facility] Vol[place] American, [place]   2 years ago Acute diastolic congestive heart failure [facility])   [place] Crissman, Jeannette How, MD   3 years ago ETOH abuse   [facility] Kathrine Haddock, NP       Future Appointments             In 1 week Jon Billings, NP [facility]              Signed Prescriptions Disp Refills   potassium  chloride SA (KLOR-CON) 20 MEQ tablet 90 tablet 0    Sig: Take 1 tablet (20 mEq total) by mouth daily.      Endocrinology:  Minerals - Potassium Supplementation Passed - 02/05/2021  8:32 AM      Passed - K in normal range and within 360 days    Potassium  Date Value Ref Range Status  05/16/2020 3.8 3.5 - 5.1 mmol/L Final          Passed - Cr in normal range and within 360 days    Creatinine, Ser  Date Value Ref Range Status  05/16/2020 1.05 0.61 - 1.24 mg/dL Final          Passed - Valid encounter within last 12 months    Recent Outpatient Visits           5 months ago Chronic diastolic congestive heart failure ([place])   [facility] Eulogio Bear, NP   8 months ago Essential hypertension   [facility] Volney American, [place]   1 year ago Essential hypertension   [facility] Vol[facility] [place], [place]   2 years ago Acute diastolic congestive heart failure [facility])   [place] Crissman, Jeannette How, MD   3 years ago ETOH abuse   [facility] Kathrine Haddock, NP       Future Appointments             In 1 week Jon Billings, NP [facility]                rosuvastatin (CRESTOR) 20 MG tablet 90 tablet 0    Sig: Take 1 tablet (20 mg total) by mouth daily.      Cardiovascular:  Antilipid - Statins Failed - 02/05/2021  8:32 AM      Failed - Total Cholesterol in normal range and within 360 days    Cholesterol, Total  Date Value Ref Range Status  05/12/2020 246 (H) 100 - 199 mg/dL Final   Cholesterol Piccolo, Waived  Date Value Ref Range Status  07/07/2015 233 (H) <200 mg/dL Final    Comment:                            Desirable                <200                         Borderline High      200- 239                         High                     >239           Failed - LDL in normal range and within 360 days    LDL Chol Calc (NIH)  Date Value Ref Range Status  05/12/2020 163 (H) 0 - 99 mg/dL Final          Passed - HDL in normal range and within 360 days    HDL  Date Value Ref Range Status  05/12/2020 72 >39 mg/dL Final          Passed - Triglycerides in normal range and within 360 days    Triglycerides  Date Value Ref Range Status  05/12/2020 64  0 - 149 mg/dL Final   Triglycerides Piccolo,Waived  Date Value Ref Range Status  07/07/2015 361 (H) <150 mg/dL Final    Comment:                            Normal                   <150                         Borderline High     150 - 199                         High                200 - 499                         Very High                >499           Passed - Patient is not pregnant      Passed - Valid encounter within last 12 months    Recent Outpatient Visits           5 months ago Chronic diastolic congestive heart failure ([place])   [place] Eulogio Bear, NP   8 months ago Essential hypertension   [facility] [place], [place]   1 year ago Essential hypertension   [facility], [place]   2 years ago Acute diastolic congestive heart failure [facility])   [place]  Crissman, Jeannette How, MD   3 years ago ETOH abuse   [facility] Kathrine Haddock, NP       Future Appointments             In 1 week Jon Billings, NP [facility]               isosorbide mononitrate (IMDUR) 30 MG 24 hr tablet 90 tablet 0    Sig: Take 1 tablet (30 mg total) by mouth daily.      Cardiovascular:  Nitrates Failed - 02/05/2021  8:32 AM      Failed - Last BP in normal range    BP Readings from Last 1 Encounters:  09/07/20 (!) 183/83          Passed - Last Heart Rate in normal range    Pulse Readings from Last 1 Encounters:  09/07/20 84          Passed - Valid encounter within last 12 months    Recent Outpatient Visits           5 months ago Chronic diastolic congestive heart failure ([place])   [facility] Eulogio Bear, NP   8 months ago Essential hypertension   [facility] [place], [place]   1 year ago Essential hypertension   [facility], [place]   2 years ago Acute diastolic congestive heart failure [facility])   [facility], Jeannette How, MD   3 years ago ETOH abuse   [facility] Kathrine Haddock, NP       Future Appointments             In 1 week [place],  Santiago Glad, NP [facility]               hydrALAZINE (APRESOLINE) 25 MG tablet 270 tablet 0    Sig: Take 1 tablet (25 mg total) by mouth 3 (three) times daily.      Cardiovascular:  Vasodilators Failed - 02/05/2021  8:32 AM      Failed - Last BP in normal range    BP Readings from Last 1 Encounters:  09/07/20 (!) 183/83          Passed - HCT in normal range and within 360 days    HCT  Date Value Ref Range Status  05/16/2020 40.4 39.0 - 52.0 % Final   Hematocrit  Date Value Ref Range Status  05/12/2020 43.6 37.5 - 51.0 % Final          Passed - HGB in normal range and within 360 days    Hemoglobin  Date Value Ref Range Status   05/16/2020 13.6 13.0 - 17.0 g/dL Final  05/12/2020 14.6 13.0 - 17.7 g/dL Final          Passed - RBC in normal range and within 360 days    RBC  Date Value Ref Range Status  05/16/2020 4.33 4.22 - 5.81 MIL/uL Final          Passed - WBC in normal range and within 360 days    WBC  Date Value Ref Range Status  05/16/2020 7.3 4.0 - 10.5 K/uL Final          Passed - PLT in normal range and within 360 days    Platelets  Date Value Ref Range Status  05/16/2020 159 150 - 400 K/uL Final  05/12/2020 172 150 - 450 x10E3/uL Final          Passed - Valid encounter within last 12 months    Recent Outpatient Visits           5 months ago Chronic diastolic congestive heart failure ([place])   [place], Jessica A, NP   8 months ago Essential hypertension   [facility] [place], [place]   1 year ago Essential hypertension   [facility], [place]   2 years ago Acute diastolic congestive heart failure [facility])   [place] Crissman, Jeannette How, MD   3 years ago ETOH abuse   [facility] Kathrine Haddock, NP       Future Appointments             In 1 week Jon Billings, NP [facility]               furosemide (LASIX) 20 MG tablet 90 tablet 0    Sig: Take 1 tablet (20 mg total) by mouth daily.      Cardiovascular:  Diuretics - Loop Failed - 02/05/2021  8:32 AM      Failed - Ca in normal range and within 360 days    Calcium  Date Value Ref Range Status  05/16/2020 8.3 (L) 8.9 - 10.3 mg/dL Final          Failed - Last BP in normal range    BP Readings from Last 1 Encounters:  09/07/20 (!) 183/83          Passed - K in normal range and within 360 days    Potassium  Date Value Ref Range Status  05/16/2020 3.8 3.5 - 5.1 mmol/L  Final          Passed - Na in normal range and within 360 days    Sodium  Date Value Ref Range Status  05/16/2020 141 135  - 145 mmol/L Final  05/12/2020 140 134 - 144 mmol/L Final          Passed - Cr in normal range and within 360 days    Creatinine, Ser  Date Value Ref Range Status  05/16/2020 1.05 0.61 - 1.24 mg/dL Final          Passed - Valid encounter within last 6 months    Recent Outpatient Visits           5 months ago Chronic diastolic congestive heart failure ([place])   [place], Jessica A, NP   8 months ago Essential hypertension   [facility] [place], [place]   1 year ago Essential hypertension   [facility] [place], [place]   2 years ago Acute diastolic congestive heart failure [facility])   [place], Jeannette How, MD   3 years ago ETOH abuse   [facility] Kathrine Haddock, NP       Future Appointments             In 1 week Jon Billings, NP [facility]               carvedilol (COREG) 25 MG tablet 180 tablet 0    Sig: Take 1 tablet (25 mg total) by mouth 2 (two) times daily with a meal.      Cardiovascular:  Beta Blockers Failed - 02/05/2021  8:32 AM      Failed - Last BP in normal range    BP Readings from Last 1 Encounters:  09/07/20 (!) 183/83          Passed - Last Heart Rate in normal range    Pulse Readings from Last 1 Encounters:  09/07/20 84          Passed - Valid encounter within last 6 months    Recent Outpatient Visits           5 months ago Chronic diastolic congestive heart failure ([place])   [facility] Eulogio Bear, NP   8 months ago Essential hypertension   [facility] [place], [place]   1 year ago Essential hypertension   [facility] [place], [place]   2 years ago Acute diastolic congestive heart failure [facility])   [place] Crissman, Jeannette How, MD   3 years ago ETOH abuse   [facility] Kathrine Haddock, NP       Future  Appointments             In 1 week Jon Billings, NP [facility]               amLODipine (NORVASC) 5 MG tablet 90 tablet 0    Sig: Take 1 tablet (5 mg total) by mouth daily.      Cardiovascular:  Calcium Channel Blockers Failed - 02/05/2021  8:32 AM      Failed - Last BP in normal range    BP Readings from Last 1 Encounters:  09/07/20 (!) 183/83          Passed - Valid encounter within last 6 months    Recent Outpatient Visits  5 months ago Chronic diastolic congestive heart failure [facility])   [facility] Eulogio Bear, NP   8 months ago Essential hypertension   [facility] Merrie Roof [place], [place]   1 year ago Essential hypertension   [place], [place]   2 years ago Acute diastolic congestive heart failure [facility])   [facility], Jeannette How, MD   3 years ago ETOH abuse   [facility] Kathrine Haddock, NP       Future Appointments             In 1 week Jon Billings, NP [facility], [place]

## 2021-02-05 NOTE — Telephone Encounter (Signed)
Patient is over due for appointment.  30 day supply sent for patient to make appointment.

## 2021-02-05 NOTE — Telephone Encounter (Signed)
Requested Prescriptions  Pending Prescriptions Disp Refills  . potassium chloride SA (KLOR-CON) 20 MEQ tablet 90 tablet 0    Sig: Take 1 tablet (20 mEq total) by mouth daily.     Endocrinology:  Minerals - Potassium Supplementation Passed - 02/05/2021  8:32 AM      Passed - K in normal range and within 360 days    Potassium  Date Value Ref Range Status  05/16/2020 3.8 3.5 - 5.1 mmol/L Final         Passed - Cr in normal range and within 360 days    Creatinine, Ser  Date Value Ref Range Status  05/16/2020 1.05 0.61 - 1.24 mg/dL Final         Passed - Valid encounter within last 12 months    Recent Outpatient Visits          5 months ago Chronic diastolic congestive heart failure [facility])   [facility] Eulogio Bear, NP   8 months ago Essential hypertension   [facility], [place]   1 year ago Essential hypertension   [facility] [place], [place]   2 years ago Acute diastolic congestive heart failure [facility])   [place] [facility], Jeannette How, MD   3 years ago ETOH abuse   [facility] Kathrine Haddock, NP      Future Appointments            In 1 week Jon Billings, NP [facility], [place]           . rosuvastatin (CRESTOR) 20 MG tablet 90 tablet 0    Sig: Take 1 tablet (20 mg total) by mouth daily.     Cardiovascular:  Antilipid - Statins Failed - 02/05/2021  8:32 AM      Failed - Total Cholesterol in normal range and within 360 days    Cholesterol, Total  Date Value Ref Range Status  05/12/2020 246 (H) 100 - 199 mg/dL Final   Cholesterol Piccolo, Waived  Date Value Ref Range Status  07/07/2015 233 (H) <200 mg/dL Final    Comment:                            Desirable                <200                         Borderline High      200- 239                         High                     >239          Failed - LDL in normal range and within 360 days    LDL  Chol Calc (NIH)  Date Value Ref Range Status  05/12/2020 163 (H) 0 - 99 mg/dL Final         Passed - HDL in normal range and within 360 days    HDL  Date Value Ref Range Status  05/12/2020 72 >39 mg/dL Final         Passed - Triglycerides in normal range and within 360 days    Triglycerides  Date Value Ref Range Status  05/12/2020 64 0 - 149  mg/dL Final   Triglycerides Piccolo,Waived  Date Value Ref Range Status  07/07/2015 361 (H) <150 mg/dL Final    Comment:                            Normal                   <150                         Borderline High     150 - 199                         High                200 - 499                         Very High                >499          Passed - Patient is not pregnant      Passed - Valid encounter within last 12 months    Recent Outpatient Visits          5 months ago Chronic diastolic congestive heart failure ([place])   [place] Eulogio Bear, NP   8 months ago Essential hypertension   [facility] [place], [place]   1 year ago Essential hypertension   [facility] [place], [place]   2 years ago Acute diastolic congestive heart failure [facility])   [place] Crissman, Jeannette How, MD   3 years ago ETOH abuse   [facility] Kathrine Haddock, NP      Future Appointments            In 1 week Jon Billings, NP [facility]           . isosorbide mononitrate (IMDUR) 30 MG 24 hr tablet 90 tablet 0    Sig: Take 1 tablet (30 mg total) by mouth daily.     Cardiovascular:  Nitrates Failed - 02/05/2021  8:32 AM      Failed - Last BP in normal range    BP Readings from Last 1 Encounters:  09/07/20 (!) 183/83         Passed - Last Heart Rate in normal range    Pulse Readings from Last 1 Encounters:  09/07/20 84         Passed - Valid encounter within last 12 months    Recent Outpatient Visits          5 months  ago Chronic diastolic congestive heart failure ([place])   [facility] Eulogio Bear, NP   8 months ago Essential hypertension   [facility] [place], [place]   1 year ago Essential hypertension   [facility], [place]   2 years ago Acute diastolic congestive heart failure [facility])   [place] Crissman, Jeannette How, MD   3 years ago ETOH abuse   [facility] Kathrine Haddock, NP      Future Appointments            In 1 week Jon Billings, NP [facility], [place]           .  hydrALAZINE (APRESOLINE) 25 MG tablet 270 tablet 0    Sig: Take 1 tablet (25 mg total) by mouth 3 (three) times daily.     Cardiovascular:  Vasodilators Failed - 02/05/2021  8:32 AM      Failed - Last BP in normal range    BP Readings from Last 1 Encounters:  09/07/20 (!) 183/83         Passed - HCT in normal range and within 360 days    HCT  Date Value Ref Range Status  05/16/2020 40.4 39.0 - 52.0 % Final   Hematocrit  Date Value Ref Range Status  05/12/2020 43.6 37.5 - 51.0 % Final         Passed - HGB in normal range and within 360 days    Hemoglobin  Date Value Ref Range Status  05/16/2020 13.6 13.0 - 17.0 g/dL Final  05/12/2020 14.6 13.0 - 17.7 g/dL Final         Passed - RBC in normal range and within 360 days    RBC  Date Value Ref Range Status  05/16/2020 4.33 4.22 - 5.81 MIL/uL Final         Passed - WBC in normal range and within 360 days    WBC  Date Value Ref Range Status  05/16/2020 7.3 4.0 - 10.5 K/uL Final         Passed - PLT in normal range and within 360 days    Platelets  Date Value Ref Range Status  05/16/2020 159 150 - 400 K/uL Final  05/12/2020 172 150 - 450 x10E3/uL Final         Passed - Valid encounter within last 12 months    Recent Outpatient Visits          5 months ago Chronic diastolic congestive heart failure ([place])   [place], Jessica A, NP   8 months ago Essential hypertension   [facility], [place]   1 year ago Essential hypertension   [facility] [place], [place]   2 years ago Acute diastolic congestive heart failure [facility])   [place] Crissman, Jeannette How, MD   3 years ago ETOH abuse   [facility] Kathrine Haddock, NP      Future Appointments            In 1 week Jon Billings, NP [facility]           . furosemide (LASIX) 20 MG tablet 90 tablet 0    Sig: Take 1 tablet (20 mg total) by mouth daily.     Cardiovascular:  Diuretics - Loop Failed - 02/05/2021  8:32 AM      Failed - Ca in normal range and within 360 days    Calcium  Date Value Ref Range Status  05/16/2020 8.3 (L) 8.9 - 10.3 mg/dL Final         Failed - Last BP in normal range    BP Readings from Last 1 Encounters:  09/07/20 (!) 183/83         Passed - K in normal range and within 360 days    Potassium  Date Value Ref Range Status  05/16/2020 3.8 3.5 - 5.1 mmol/L Final         Passed - Na in normal range and within 360 days    Sodium  Date Value Ref Range Status  05/16/2020 141 135 - 145 mmol/L  Final  05/12/2020 140 134 - 144 mmol/L Final         Passed - Cr in normal range and within 360 days    Creatinine, Ser  Date Value Ref Range Status  05/16/2020 1.05 0.61 - 1.24 mg/dL Final         Passed - Valid encounter within last 6 months    Recent Outpatient Visits          5 months ago Chronic diastolic congestive heart failure ([place])   [facility] Eulogio Bear, NP   8 months ago Essential hypertension   [facility] [place], [place]   1 year ago Essential hypertension   [facility] [facility], [place]   2 years ago Acute diastolic congestive heart failure [facility])   [place] Crissman, Jeannette How, MD   3 years ago ETOH abuse    [facility] Kathrine Haddock, NP      Future Appointments            In 1 week Jon Billings, NP [facility]           . carbamazepine (TEGRETOL) 200 MG tablet 180 tablet 0    Sig: Take 1 tablet (200 mg total) by mouth 2 (two) times daily.     Not Delegated - Neurology:  Anticonvulsants - carbamazepine Failed - 02/05/2021  8:32 AM      Failed - This refill cannot be delegated      Failed - AST in normal range and within 90 days    AST  Date Value Ref Range Status  05/16/2020 34 15 - 41 U/L Final         Failed - ALT in normal range and within 90 days    ALT  Date Value Ref Range Status  05/16/2020 24 0 - 44 U/L Final         Failed - Carbamazepine (serum) in normal range and within 360 days    Carbamazepine (Tegretol), S  Date Value Ref Range Status  10/18/2019 6.3 4.0 - 12.0 ug/mL Final    Comment:             In conjunction with other antiepileptic drugs                                Therapeutic  4.0 -  8.0                                Toxicity     9.0 - 12.0                                    Carbamazepine alone                                Therapeutic  8.0 - 12.0                                 Detection Limit =  2.0                           <2.0 indicated  None Detected          Failed - WBC in normal range and within 90 days    WBC  Date Value Ref Range Status  05/16/2020 7.3 4.0 - 10.5 K/uL Final         Failed - PLT in normal range and within 90 days    Platelets  Date Value Ref Range Status  05/16/2020 159 150 - 400 K/uL Final  05/12/2020 172 150 - 450 x10E3/uL Final         Failed - HGB in normal range and within 90 days    Hemoglobin  Date Value Ref Range Status  05/16/2020 13.6 13.0 - 17.0 g/dL Final  05/12/2020 14.6 13.0 - 17.7 g/dL Final         Failed - Na in normal range and within 90 days    Sodium  Date Value Ref Range Status  05/16/2020 141 135 - 145 mmol/L Final  05/12/2020 140 134 - 144  mmol/L Final         Failed - HCT in normal range and within 90 days    HCT  Date Value Ref Range Status  05/16/2020 40.4 39.0 - 52.0 % Final   Hematocrit  Date Value Ref Range Status  05/12/2020 43.6 37.5 - 51.0 % Final         Passed - Valid encounter within last 12 months    Recent Outpatient Visits          5 months ago Chronic diastolic congestive heart failure ([place])   [place], Jessica A, NP   8 months ago Essential hypertension   [facility], [place]   1 year ago Essential hypertension   [place]   2 years ago Acute diastolic congestive heart failure [facility])   [place], Jeannette How, MD   3 years ago ETOH abuse   [facility] Kathrine Haddock, NP      Future Appointments            In 1 week Jon Billings, NP [facility]           . carvedilol (COREG) 25 MG tablet 180 tablet 0    Sig: Take 1 tablet (25 mg total) by mouth 2 (two) times daily with a meal.     Cardiovascular:  Beta Blockers Failed - 02/05/2021  8:32 AM      Failed - Last BP in normal range    BP Readings from Last 1 Encounters:  09/07/20 (!) 183/83         Passed - Last Heart Rate in normal range    Pulse Readings from Last 1 Encounters:  09/07/20 84         Passed - Valid encounter within last 6 months    Recent Outpatient Visits          5 months ago Chronic diastolic congestive heart failure ([place])   [facility] Eulogio Bear, NP   8 months ago Essential hypertension   [facility], [place]   1 year ago Essential hypertension   [facility], [place]   2 years ago Acute diastolic congestive heart failure [facility])   [facility], Jeannette How, MD   3 years ago ETOH abuse   [facility] Kathrine Haddock, NP      Future Appointments  In 1 week Jon Billings, NP [facility]           . lisinopril (ZESTRIL) 40 MG tablet 90 tablet 0    Sig: Take 1 tablet (40 mg total) by mouth daily.     Cardiovascular:  ACE Inhibitors Failed - 02/05/2021  8:32 AM      Failed - Cr in normal range and within 180 days    Creatinine, Ser  Date Value Ref Range Status  05/16/2020 1.05 0.61 - 1.24 mg/dL Final         Failed - K in normal range and within 180 days    Potassium  Date Value Ref Range Status  05/16/2020 3.8 3.5 - 5.1 mmol/L Final         Failed - Last BP in normal range    BP Readings from Last 1 Encounters:  09/07/20 (!) 183/83         Passed - Patient is not pregnant      Passed - Valid encounter within last 6 months    Recent Outpatient Visits          5 months ago Chronic diastolic congestive heart failure ([place])   [place], Jessica A, NP   8 months ago Essential hypertension   [facility], [place]   1 year ago Essential hypertension   [facility], [place]   2 years ago Acute diastolic congestive heart failure [facility])   [place] Crissman, Jeannette How, MD   3 years ago ETOH abuse   [facility] Kathrine Haddock, NP      Future Appointments            In 1 week Jon Billings, NP [facility]           . amLODipine (NORVASC) 5 MG tablet 90 tablet 0    Sig: Take 1 tablet (5 mg total) by mouth daily.     Cardiovascular:  Calcium Channel Blockers Failed - 02/05/2021  8:32 AM      Failed - Last BP in normal range    BP Readings from Last 1 Encounters:  09/07/20 (!) 183/83         Passed - Valid encounter within last 6 months    Recent Outpatient Visits          5 months ago Chronic diastolic congestive heart failure ([place])   [facility] Eulogio Bear, NP   8 months ago Essential hypertension   [facility]  [place], [place]   1 year ago Essential hypertension   [facility] [place] [facility], [place]   2 years ago Acute diastolic congestive heart failure [facility])   [place] Cr[place]man, Jeannette How, MD   3 years ago ETOH abuse   [facility] Kathrine Haddock, NP      Future Appointments            In 1 week Jon Billings, NP [facility], [place]

## 2021-02-05 NOTE — Telephone Encounter (Signed)
Routing to provider  

## 2021-02-05 NOTE — Telephone Encounter (Signed)
Pt is scheduled for 4/14

## 2021-02-05 NOTE — Telephone Encounter (Signed)
Please get patient scheduled within 30 days

## 2021-02-05 NOTE — Telephone Encounter (Signed)
Medication Refill - Medication: Pt is mainly concerned about refilling his lisinopril (ZESTRIL) 40 MG tablet   But pt also needs refill for all of his other meds  amLODipine (NORVASC) 5 MG tablet  carbamazepine (TEGRETOL) 200 MG tablet  carvedilol (COREG) 25 MG tablet  furosemide (LASIX) 20 MG tablet  hydrALAZINE (APRESOLINE) 25 MG tablet   isosorbide mononitrate (IMDUR) 30 MG 24 hr tablet   potassium chloride SA (KLOR-CON) 20 MEQ tablet   rosuvastatin (CRESTOR) 20 MG tablet  Has the patient contacted their pharmacy? Yes.   (Agent: If no, request that the patient contact the pharmacy for the refill.) (Agent: If yes, when and what did the pharmacy advise?)call pcp   Preferred Pharmacy (with phone number or street name): [facility] [place] [address]  Phone:  [phone] Fax:  [phone]   Agent: Please be advised that RX refills may take up to 3 business days. We ask that you follow-up with your pharmacy.

## 2021-02-18 ENCOUNTER — Other Ambulatory Visit: Payer: Self-pay

## 2021-02-18 ENCOUNTER — Encounter: Payer: Self-pay | Admitting: Nurse Practitioner

## 2021-02-18 ENCOUNTER — Ambulatory Visit: Payer: 59 | Admitting: Nurse Practitioner

## 2021-02-18 VITALS — BP 196/98 | HR 49 | Temp 98.8°F | Wt 222.0 lb

## 2021-02-18 DIAGNOSIS — I10 Essential (primary) hypertension: Secondary | ICD-10-CM | POA: Diagnosis not present

## 2021-02-18 DIAGNOSIS — I5032 Chronic diastolic (congestive) heart failure: Secondary | ICD-10-CM | POA: Diagnosis not present

## 2021-02-18 DIAGNOSIS — E7800 Pure hypercholesterolemia, unspecified: Secondary | ICD-10-CM | POA: Diagnosis not present

## 2021-02-18 DIAGNOSIS — G40909 Epilepsy, unspecified, not intractable, without status epilepticus: Secondary | ICD-10-CM

## 2021-02-18 DIAGNOSIS — F1010 Alcohol abuse, uncomplicated: Secondary | ICD-10-CM

## 2021-02-18 DIAGNOSIS — R569 Unspecified convulsions: Secondary | ICD-10-CM

## 2021-02-18 DIAGNOSIS — F17200 Nicotine dependence, unspecified, uncomplicated: Secondary | ICD-10-CM

## 2021-02-18 DIAGNOSIS — J449 Chronic obstructive pulmonary disease, unspecified: Secondary | ICD-10-CM

## 2021-02-18 MED ORDER — LISINOPRIL 40 MG PO TABS: 40.0000 mg | ORAL_TABLET | Freq: Every day | ORAL | 0 refills | Status: DC

## 2021-02-18 MED ORDER — FUROSEMIDE 20 MG PO TABS: 20.0000 mg | ORAL_TABLET | Freq: Every day | ORAL | 0 refills | Status: DC

## 2021-02-18 MED ORDER — CARVEDILOL 25 MG PO TABS: 25.0000 mg | ORAL_TABLET | Freq: Two times a day (BID) | ORAL | 0 refills | Status: DC

## 2021-02-18 MED ORDER — HYDRALAZINE HCL 25 MG PO TABS: 25.0000 mg | ORAL_TABLET | Freq: Three times a day (TID) | ORAL | 0 refills | Status: DC

## 2021-02-18 MED ORDER — ISOSORBIDE MONONITRATE ER 30 MG PO TB24: 30.0000 mg | ORAL_TABLET | Freq: Every day | ORAL | 0 refills | Status: DC

## 2021-02-18 MED ORDER — ALBUTEROL SULFATE HFA 108 (90 BASE) MCG/ACT IN AERS: 2.0000 | INHALATION_SPRAY | Freq: Four times a day (QID) | RESPIRATORY_TRACT | 0 refills | Status: DC | PRN

## 2021-02-18 MED ORDER — POTASSIUM CHLORIDE CRYS ER 20 MEQ PO TBCR: 20.0000 meq | EXTENDED_RELEASE_TABLET | Freq: Every day | ORAL | 0 refills | Status: DC

## 2021-02-18 MED ORDER — CARBAMAZEPINE 200 MG PO TABS: 200.0000 mg | ORAL_TABLET | Freq: Two times a day (BID) | ORAL | 0 refills | Status: DC

## 2021-02-18 MED ORDER — AMLODIPINE BESYLATE 5 MG PO TABS: 5.0000 mg | ORAL_TABLET | Freq: Every day | ORAL | 0 refills | Status: DC

## 2021-02-18 MED ORDER — ROSUVASTATIN CALCIUM 20 MG PO TABS: 20.0000 mg | ORAL_TABLET | Freq: Every day | ORAL | 0 refills | Status: DC

## 2021-02-18 NOTE — Progress Notes (Addendum)
BP (!) 196/98   Pulse (!) 49   Temp 98.8 F (37.1 C) (Oral)   Wt 222 lb (100.7 kg)   SpO2 95%   BMI 31.40 kg/m    Subjective:    Patient ID: Jose Dakins., male    DOB: 01-Feb-1971, 50 y.o.   MRN: [id number]  HPI: Jose Flynt. is a 50 y.o. male  Chief Complaint  Patient presents with  . Hyperlipidemia  . Hypertension  . Congestive Heart Failure   Patient here to follow up today on chronic conditions.  Patient has been out of medication for about 1 month.    Patient states he is feeling a little sluggish.  Has not followed up with the HF clinic since July.    Patient is currently drinking about 1/2 pint per day which is down from 1 pint.   Patient denies concerns at visit today.   Denies HA, CP, SOB, dizziness, palpitations, visual changes, and lower extremity swelling.   Relevant past medical, surgical, family and social history reviewed and updated as indicated. Interim medical history since our last visit reviewed. Allergies and medications reviewed and updated.  Review of Systems  Eyes: Negative for visual disturbance.  Respiratory: Negative for shortness of breath.   Cardiovascular: Negative for chest pain and leg swelling.  Neurological: Negative for light-headedness and headaches.    Per HPI unless specifically indicated above     Objective:    BP (!) 196/98   Pulse (!) 49   Temp 98.8 F (37.1 C) (Oral)   Wt 222 lb (100.7 kg)   SpO2 95%   BMI 31.40 kg/m   Wt Readings from Last 3 Encounters:  02/18/21 222 lb (100.7 kg)  09/07/20 212 lb 12.8 oz (96.5 kg)  05/16/20 220 lb (99.8 kg)    Physical Exam Vitals and nursing note reviewed.  Constitutional:      General: He is not in acute distress.    Appearance: Normal appearance. He is diaphoretic. He is not ill-appearing or toxic-appearing.  HENT:     Head: Normocephalic.     Right Ear: External ear normal.     Left Ear: External ear normal.     Nose: Nose normal. No congestion or rhinorrhea.      Mouth/Throat:     Mouth: Mucous membranes are moist.  Eyes:     General: Scleral icterus present.        Right eye: No discharge.        Left eye: No discharge.     Extraocular Movements: Extraocular movements intact.     Conjunctiva/sclera: Conjunctivae normal.     Pupils: Pupils are equal, round, and reactive to light.  Cardiovascular:     Rate and Rhythm: Normal rate and regular rhythm.     Heart sounds: Murmur heard.    Pulmonary:     Effort: Pulmonary effort is normal. No respiratory distress.     Breath sounds: Normal breath sounds. No wheezing, rhonchi or rales.  Abdominal:     General: Abdomen is flat. Bowel sounds are normal.  Musculoskeletal:     Cervical back: Normal range of motion and neck supple.  Skin:    General: Skin is warm.     Capillary Refill: Capillary refill takes less than 2 seconds.  Neurological:     General: No focal deficit present.     Mental Status: He is alert and oriented to person, place, and time.  Psychiatric:  Mood and Affect: Mood normal.        Behavior: Behavior normal.        Thought Content: Thought content normal.        Judgment: Judgment normal.     Results for orders placed or performed during the hospital encounter of 05/16/20  CBC  Result Value Ref Range   WBC 7.3 4.0 - 10.5 K/uL   RBC 4.33 4.22 - 5.81 MIL/uL   Hemoglobin 13.6 13.0 - 17.0 g/dL   HCT 40.4 39.0 - 52.0 %   MCV 93.3 80.0 - 100.0 fL   MCH 31.4 26.0 - 34.0 pg   MCHC 33.7 30.0 - 36.0 g/dL   RDW 14.9 11.5 - 15.5 %   Platelets 159 150 - 400 K/uL   nRBC 1.0 (H) 0.0 - 0.2 %  Comprehensive metabolic panel  Result Value Ref Range   Sodium 141 135 - 145 mmol/L   Potassium 3.8 3.5 - 5.1 mmol/L   Chloride 109 98 - 111 mmol/L   CO2 22 22 - 32 mmol/L   Glucose, Bld 97 70 - 99 mg/dL   BUN 18 6 - 20 mg/dL   Creatinine, Ser 1.05 0.61 - 1.24 mg/dL   Calcium 8.3 (L) 8.9 - 10.3 mg/dL   Total Protein 6.5 6.5 - 8.1 g/dL   Albumin 3.4 (L) 3.5 - 5.0 g/dL   AST 34 15  - 41 U/L   ALT 24 0 - 44 U/L   Alkaline Phosphatase 66 38 - 126 U/L   Total Bilirubin 0.5 0.3 - 1.2 mg/dL   GFR calc non Af Amer >60 >60 mL/min   GFR calc Af Amer >60 >60 mL/min   Anion gap 10 5 - 15  Brain natriuretic peptide  Result Value Ref Range   B Natriuretic Peptide 50.9 0.0 - 100.0 pg/mL  Ethanol  Result Value Ref Range   Alcohol, Ethyl (B) 232 (H) <10 mg/dL  Magnesium  Result Value Ref Range   Magnesium 1.4 (L) 1.7 - 2.4 mg/dL  Troponin I (High Sensitivity)  Result Value Ref Range   Troponin I (High Sensitivity) 15 <18 ng/L  Troponin I (High Sensitivity)  Result Value Ref Range   Troponin I (High Sensitivity) 11 <18 ng/L      Assessment & Plan:   Problem List Items Addressed This Visit      Cardiovascular and Mediastinum   Hypertension    Chronic.  Uncontrolled. Patient has not been taking medications for one month due to being busy and not being able to get in for an appointment. Patient has not followed up in HF clinic since July 2021.  Recommend patient restart medications, follow up in HF clinic and return in 2 weeks for reevaluation.       Relevant Medications   amLODipine (NORVASC) 5 MG tablet   carvedilol (COREG) 25 MG tablet   furosemide (LASIX) 20 MG tablet   hydrALAZINE (APRESOLINE) 25 MG tablet   isosorbide mononitrate (IMDUR) 30 MG 24 hr tablet   lisinopril (ZESTRIL) 40 MG tablet   rosuvastatin (CRESTOR) 20 MG tablet   Chronic diastolic congestive heart failure (HCC) - Primary    Chronic.  Uncontrolled. Patient has not been taking medications for one month due to being busy and not being able to get in for an appointment. Patient has not followed up in HF clinic since July 2021.  Recommend patient restart medications, follow up in HF clinic and return in 2 weeks for reevaluation.  Reached out  to HF clinic to see if they are able to assist patient in getting an appointment. Reviewed signs and symptoms of when to seek higher level of care with patient.        Relevant Medications   amLODipine (NORVASC) 5 MG tablet   carvedilol (COREG) 25 MG tablet   furosemide (LASIX) 20 MG tablet   hydrALAZINE (APRESOLINE) 25 MG tablet   isosorbide mononitrate (IMDUR) 30 MG 24 hr tablet   lisinopril (ZESTRIL) 40 MG tablet   rosuvastatin (CRESTOR) 20 MG tablet     Nervous and Auditory   Epilepsy (HCC)   Relevant Medications   carbamazepine (TEGRETOL) 200 MG tablet     Other   Seizures (HCC)    Chronic.  Patient has stopped medications. Denies having any seizure activity since stopping medications. Resume taking medications.  Will likely refer to neurology at next visit for assistance in management.       Relevant Medications   carbamazepine (TEGRETOL) 200 MG tablet   Chronic alcohol abuse    Chronic.  Ongoing.  Patient has cut down to 1/2 Pint of liquor daily.  Praised for decreasing amount of alcohol he is consuming daily.  Recommend he continue to decrease amount of liquor.  Discussed with patient that we can recommend community resources for patient when he is ready to stop drinking.       Hypercholesterolemia    Chronic.  Resume taking medications.  Follow up in 1 month.       Relevant Medications   amLODipine (NORVASC) 5 MG tablet   carvedilol (COREG) 25 MG tablet   furosemide (LASIX) 20 MG tablet   hydrALAZINE (APRESOLINE) 25 MG tablet   isosorbide mononitrate (IMDUR) 30 MG 24 hr tablet   lisinopril (ZESTRIL) 40 MG tablet   rosuvastatin (CRESTOR) 20 MG tablet       Follow up plan: Return in about 1 month (around 03/20/2021) for HTN, HLD, DM2 FU, BP Check.

## 2021-02-22 ENCOUNTER — Encounter: Payer: Self-pay | Admitting: Nurse Practitioner

## 2021-02-22 ENCOUNTER — Telehealth: Payer: Self-pay | Admitting: Family

## 2021-02-22 ENCOUNTER — Telehealth: Payer: Self-pay | Admitting: Nurse Practitioner

## 2021-02-22 DIAGNOSIS — F10920 Alcohol use, unspecified with intoxication, uncomplicated: Secondary | ICD-10-CM | POA: Insufficient documentation

## 2021-02-22 NOTE — Telephone Encounter (Signed)
Can we please call patient and move his appointment up to see me in 2 weeks vs 1 month due to his significantly elevated blood pressure at visit.

## 2021-02-22 NOTE — Assessment & Plan Note (Signed)
Chronic.  Ongoing.  Patient has cut down to 1/2 Pint of liquor daily.  Praised for decreasing amount of alcohol he is consuming daily.  Recommend he continue to decrease amount of liquor.  Discussed with patient that we can recommend community resources for patient when he is ready to stop drinking.

## 2021-02-22 NOTE — Telephone Encounter (Signed)
Called pt was at work spoke with wife deborah she states that she will call back. Santiago Glad wants him seen 5/2 or after

## 2021-02-22 NOTE — Assessment & Plan Note (Signed)
Chronic.  Patient has stopped medications. Denies having any seizure activity since stopping medications. Resume taking medications.  Will likely refer to neurology at next visit for assistance in management.

## 2021-02-22 NOTE — Assessment & Plan Note (Signed)
Chronic.  Resume taking medications.  Follow up in 1 month.

## 2021-02-22 NOTE — Assessment & Plan Note (Addendum)
Chronic.  Uncontrolled. Patient has not been taking medications for one month due to being busy and not being able to get in for an appointment. Patient has not followed up in HF clinic since July 2021.  Recommend patient restart medications, follow up in HF clinic and return in 2 weeks for reevaluation.  Reached out to HF clinic to see if they are able to assist patient in getting an appointment. Reviewed signs and symptoms of when to seek higher level of care with patient.

## 2021-02-22 NOTE — Telephone Encounter (Signed)
Per Patients PCP Provider and Darylene Price, I scheduled patient with a follow up appointment for the first week of may and lvm with patient to let him know.   Avel Ogawa, NT

## 2021-02-22 NOTE — Assessment & Plan Note (Addendum)
Chronic.  Uncontrolled. Patient has not been taking medications for one month due to being busy and not being able to get in for an appointment. Patient has not followed up in HF clinic since July 2021.  Recommend patient restart medications, follow up in HF clinic and return in 2 weeks for reevaluation.

## 2021-03-08 NOTE — Progress Notes (Deleted)
Patient ID: [name], male    DOB: January 11, 1971, 50 y.o.   MRN: [id number]  Mr Stettler is a 50 y/o male with a history of hyperlipidemia, HTN, current tobacco/ alcohol use and chronic heart failure.   Echo report from 01/29/2019 reviewed and showed an EF of 60-65%,  Has not been admitted or been in the ED in the last 6 months.   He presents today for a follow-up visit with a chief complaint of    Past Medical History:  Diagnosis Date  . Alcohol abuse   . Cervicalgia   . CHF (congestive heart failure) ([place])   . Hyperlipidemia   . Hypertension   . Seizures ([place])    Past Surgical History:  Procedure Laterality Date  . COLONOSCOPY WITH PROPOFOL N/A 01/16/2018   Procedure: COLONOSCOPY WITH PROPOFOL;  Surgeon: Lin Landsman, MD;  Location: [facility] ENDOSCOPY;  Service: Gastroenterology;  Laterality: N/A;  . ESOPHAGOGASTRODUODENOSCOPY (EGD) WITH PROPOFOL N/A 01/16/2018   Procedure: ESOPHAGOGASTRODUODENOSCOPY (EGD) WITH PROPOFOL;  Surgeon: Lin Landsman, MD;  Location: [facility] ENDOSCOPY;  Service: Gastroenterology;  Laterality: N/A;   Family History  Problem Relation Age of Onset  . Diabetes Mother   . Colon cancer Mother   . Hypertension Father   . Multiple sclerosis Father    Social History   Tobacco Use  . Smoking status: Current Every Day Smoker    Packs/day: 0.00    Types: Cigars  . Smokeless tobacco: Former Systems developer  . Tobacco comment: 2 cigars a day  Substance Use Topics  . Alcohol use: Yes    Alcohol/week: 28.0 standard drinks    Types: 7 Cans of beer, 21 Shots of liquor per week    Comment: half pint liquor per day   No Known Allergies     Review of Systems  Constitutional: Positive for fatigue (intermittent). Negative for appetite change.  HENT: Positive for hearing loss. Negative for congestion and rhinorrhea.   Eyes: Negative.   Respiratory: Positive for shortness of breath (with moderate exertion). Negative for cough and chest tightness.   Cardiovascular:  Negative for chest pain, palpitations and leg swelling.  Gastrointestinal: Negative for abdominal distention and abdominal pain.  Endocrine: Negative.   Genitourinary: Negative.   Musculoskeletal: Negative.   Skin: Negative.   Allergic/Immunologic: Negative.   Neurological: Negative for dizziness and light-headedness.  Hematological: Negative for adenopathy. Does not bruise/bleed easily.  Psychiatric/Behavioral: Negative for dysphoric mood and sleep disturbance. The patient is not nervous/anxious.       Physical Exam Vitals and nursing note reviewed.  Constitutional:      Appearance: Normal appearance.  HENT:     Head: Normocephalic and atraumatic.     Right Ear: Decreased hearing noted.     Left Ear: Decreased hearing noted.  Neck:     Vascular: No JVD.  Cardiovascular:     Rate and Rhythm: Normal rate and regular rhythm.  Pulmonary:     Effort: Pulmonary effort is normal.     Breath sounds: No wheezing, rhonchi or rales.  Abdominal:     General: There is no distension.     Palpations: Abdomen is soft.     Tenderness: There is no abdominal tenderness.  Musculoskeletal:        General: No swelling or tenderness.     Cervical back: Normal range of motion and neck supple.  Skin:    General: Skin is warm and dry.  Neurological:     General: No focal deficit  present.     Mental Status: He is alert and oriented to person, place, and time.  Psychiatric:        Mood and Affect: Mood normal.        Behavior: Behavior normal.     Assessment & Plan:  1. Chronic heart failure with preserved ejection fraction- - NYHA class II - euvolemic today - Weighing daily and reminded to call for an overnight weight gain of >2 pounds or a weekly weight gain of > 5 pounds - weight 218.2 pounds from last visit 10 months ago - says that he's quite active at work  - not adding salt and is trying to follow a low sodium diet - BNP 07/02/2019 was 32.0 - has received both COVID vaccines  2:  HTN- - BP  - saw PCP Mathis Dad) 02/18/21 - BMP from 05/16/20 reviewed and showed sodium 141, potassium 3.8, creatinine 1.05 and GFR >60  3: Tobacco use-  - says that he's smoking ~ 1-2 cigars daily  - has 1 shot of liquor and maybe 1/2 a beer daily - complete cessation discussed for 2 minutes with him   Patient did not bring his medications nor a list. Each medication was verbally reviewed with the patient and he was encouraged to bring the bottles to every visit to confirm accuracy of list.

## 2021-03-09 ENCOUNTER — Ambulatory Visit: Payer: 59 | Admitting: Family

## 2021-03-09 ENCOUNTER — Telehealth: Payer: Self-pay | Admitting: Family

## 2021-03-09 NOTE — Telephone Encounter (Signed)
Patient did not show for his Heart Failure Clinic appointment on 03/09/21. Will attempt to reschedule.

## 2021-03-22 ENCOUNTER — Ambulatory Visit (INDEPENDENT_AMBULATORY_CARE_PROVIDER_SITE_OTHER): Payer: 59 | Admitting: Nurse Practitioner

## 2021-03-22 ENCOUNTER — Encounter: Payer: Self-pay | Admitting: Nurse Practitioner

## 2021-03-22 ENCOUNTER — Other Ambulatory Visit: Payer: Self-pay

## 2021-03-22 VITALS — BP 214/116 | HR 44 | Temp 98.9°F | Ht 74.02 in | Wt 218.2 lb

## 2021-03-22 DIAGNOSIS — I10 Essential (primary) hypertension: Secondary | ICD-10-CM

## 2021-03-22 DIAGNOSIS — I5032 Chronic diastolic (congestive) heart failure: Secondary | ICD-10-CM | POA: Diagnosis not present

## 2021-03-22 DIAGNOSIS — G40909 Epilepsy, unspecified, not intractable, without status epilepticus: Secondary | ICD-10-CM

## 2021-03-22 NOTE — Assessment & Plan Note (Signed)
Chronic.  Uncontrolled. Patient has not followed up in HF clinic since July 2021. Patient missed appointment for earlier this month. Recommend patient restart medications, follow up in HF clinic ASAP.  Will reach out to HF clinic to get patient reschedule.  Reviewed signs and symptoms of when to seek higher level of care with patient.  Return to clinic in 1 week. 

## 2021-03-22 NOTE — Progress Notes (Signed)
BP (!) 214/116   Pulse (!) 44   Temp 98.9 F (37.2 C) (Oral)   Ht 6' 2.02" (1.88 m)   Wt 218 lb 3.2 oz (99 kg)   SpO2 97%   BMI 28.00 kg/m    Subjective:    Patient ID: Jose Dakins., male    DOB: July 06, 1971, 50 y.o.   MRN: [id number]  HPI: Jose Liska. is a 50 y.o. male  Chief Complaint  Patient presents with  . Hyperlipidemia  . Hypertension  . Diabetes   Patient states he forgot his medications at home when he was out of town this weekend.  States he had been taking them other than the last 3 days.  He missed his appointment with the HF clinic on 03/12/2021.  Patient states he otherwise feels well.  Does not check blood pressures at home.    Patient denies concerns at visit today.   Denies HA, CP, SOB, dizziness, palpitations, visual changes, and lower extremity swelling.   Relevant past medical, surgical, family and social history reviewed and updated as indicated. Interim medical history since our last visit reviewed. Allergies and medications reviewed and updated.  Review of Systems  Eyes: Negative for visual disturbance.  Respiratory: Negative for shortness of breath.   Cardiovascular: Negative for chest pain and leg swelling.  Neurological: Negative for light-headedness and headaches.    Per HPI unless specifically indicated above     Objective:    BP (!) 214/116   Pulse (!) 44   Temp 98.9 F (37.2 C) (Oral)   Ht 6' 2.02" (1.88 m)   Wt 218 lb 3.2 oz (99 kg)   SpO2 97%   BMI 28.00 kg/m   Wt Readings from Last 3 Encounters:  03/22/21 218 lb 3.2 oz (99 kg)  02/18/21 222 lb (100.7 kg)  09/07/20 212 lb 12.8 oz (96.5 kg)    Physical Exam Vitals and nursing note reviewed.  Constitutional:      General: He is not in acute distress.    Appearance: Normal appearance. He is not ill-appearing, toxic-appearing or diaphoretic.  HENT:     Head: Normocephalic.     Right Ear: External ear normal.     Left Ear: External ear normal.     Nose: Nose normal. No  congestion or rhinorrhea.     Mouth/Throat:     Mouth: Mucous membranes are moist.  Eyes:     General:        Right eye: No discharge.        Left eye: No discharge.     Extraocular Movements: Extraocular movements intact.     Conjunctiva/sclera: Conjunctivae normal.     Pupils: Pupils are equal, round, and reactive to light.  Cardiovascular:     Rate and Rhythm: Normal rate and regular rhythm.     Heart sounds: Murmur heard.    Pulmonary:     Effort: Accessory muscle usage present.     Breath sounds: Normal breath sounds. No wheezing, rhonchi or rales.  Abdominal:     General: Abdomen is flat. Bowel sounds are normal.  Musculoskeletal:     Cervical back: Normal range of motion and neck supple.  Skin:    General: Skin is warm.     Capillary Refill: Capillary refill takes less than 2 seconds.  Neurological:     General: No focal deficit present.     Mental Status: He is alert and oriented to person, place, and time.  Psychiatric:        Mood and Affect: Mood normal.        Behavior: Behavior normal.        Thought Content: Thought content normal.        Judgment: Judgment normal.     Results for orders placed or performed during the hospital encounter of 05/16/20  CBC  Result Value Ref Range   WBC 7.3 4.0 - 10.5 K/uL   RBC 4.33 4.22 - 5.81 MIL/uL   Hemoglobin 13.6 13.0 - 17.0 g/dL   HCT 40.4 39.0 - 52.0 %   MCV 93.3 80.0 - 100.0 fL   MCH 31.4 26.0 - 34.0 pg   MCHC 33.7 30.0 - 36.0 g/dL   RDW 14.9 11.5 - 15.5 %   Platelets 159 150 - 400 K/uL   nRBC 1.0 (H) 0.0 - 0.2 %  Comprehensive metabolic panel  Result Value Ref Range   Sodium 141 135 - 145 mmol/L   Potassium 3.8 3.5 - 5.1 mmol/L   Chloride 109 98 - 111 mmol/L   CO2 22 22 - 32 mmol/L   Glucose, Bld 97 70 - 99 mg/dL   BUN 18 6 - 20 mg/dL   Creatinine, Ser 1.05 0.61 - 1.24 mg/dL   Calcium 8.3 (L) 8.9 - 10.3 mg/dL   Total Protein 6.5 6.5 - 8.1 g/dL   Albumin 3.4 (L) 3.5 - 5.0 g/dL   AST 34 15 - 41 U/L    ALT 24 0 - 44 U/L   Alkaline Phosphatase 66 38 - 126 U/L   Total Bilirubin 0.5 0.3 - 1.2 mg/dL   GFR calc non Af Amer >60 >60 mL/min   GFR calc Af Amer >60 >60 mL/min   Anion gap 10 5 - 15  Brain natriuretic peptide  Result Value Ref Range   B Natriuretic Peptide 50.9 0.0 - 100.0 pg/mL  Ethanol  Result Value Ref Range   Alcohol, Ethyl (B) 232 (H) <10 mg/dL  Magnesium  Result Value Ref Range   Magnesium 1.4 (L) 1.7 - 2.4 mg/dL  Troponin I (High Sensitivity)  Result Value Ref Range   Troponin I (High Sensitivity) 15 <18 ng/L  Troponin I (High Sensitivity)  Result Value Ref Range   Troponin I (High Sensitivity) 11 <18 ng/L      Assessment & Plan:   Problem List Items Addressed This Visit      Cardiovascular and Mediastinum   Hypertension    Chronic.  Uncontrolled. Patient has not followed up in HF clinic since July 2021. Patient missed appointment for earlier this month. Recommend patient restart medications, follow up in HF clinic ASAP.  Will reach out to HF clinic to get patient reschedule.  Reviewed signs and symptoms of when to seek higher level of care with patient.  Return to clinic in 1 week.      Relevant Orders   Comp Met (CMET)   CBC w/Diff   Lipid Profile   Chronic diastolic congestive heart failure ([place]) - Primary    Chronic.  Uncontrolled. Patient has not followed up in HF clinic since July 2021. Patient missed appointment for earlier this month. Recommend patient restart medications, follow up in HF clinic ASAP.  Will reach out to HF clinic to get patient reschedule.  Reviewed signs and symptoms of when to seek higher level of care with patient.  Return to clinic in 1 week.      Relevant Orders   Comp Met (CMET)   CBC  w/Diff   Lipid Profile     Nervous and Auditory   Epilepsy ([facility])    Chronic, ongoing. Has not followed up with Neurology.       Relevant Orders   CBC w/Diff       Follow up plan: Return in about 1 week (around 03/29/2021) for BP  Check.   A total of 30 minutes were spent on this encounter today.  When total time is documented, this includes both the face-to-face and non-face-to-face time personally spent before, during and after the visit on the date of the encounter.

## 2021-03-22 NOTE — Assessment & Plan Note (Signed)
Chronic, ongoing. Has not followed up with Neurology.

## 2021-03-22 NOTE — Assessment & Plan Note (Signed)
Chronic.  Uncontrolled. Patient has not followed up in HF clinic since July 2021. Patient missed appointment for earlier this month. Recommend patient restart medications, follow up in HF clinic ASAP.  Will reach out to HF clinic to get patient reschedule.  Reviewed signs and symptoms of when to seek higher level of care with patient.  Return to clinic in 1 week.

## 2021-03-23 ENCOUNTER — Telehealth: Payer: Self-pay | Admitting: Family

## 2021-03-23 LAB — CBC WITH DIFFERENTIAL/PLATELET
Basophils Absolute: 0.1 10*3/uL (ref 0.0–0.2)
Basos: 1 %
EOS (ABSOLUTE): 0.1 10*3/uL (ref 0.0–0.4)
Eos: 2 %
Hematocrit: 39.7 % (ref 37.5–51.0)
Hemoglobin: 13.9 g/dL (ref 13.0–17.7)
Immature Grans (Abs): 0 10*3/uL (ref 0.0–0.1)
Immature Granulocytes: 1 %
Lymphocytes Absolute: 2 10*3/uL (ref 0.7–3.1)
Lymphs: 33 %
MCH: 30.8 pg (ref 26.6–33.0)
MCHC: 35 g/dL (ref 31.5–35.7)
MCV: 88 fL (ref 79–97)
Monocytes Absolute: 0.6 10*3/uL (ref 0.1–0.9)
Monocytes: 10 %
Neutrophils Absolute: 3.3 10*3/uL (ref 1.4–7.0)
Neutrophils: 53 %
Platelets: 152 10*3/uL (ref 150–450)
RBC: 4.51 x10E6/uL (ref 4.14–5.80)
RDW: 12.8 % (ref 11.6–15.4)
WBC: 6.2 10*3/uL (ref 3.4–10.8)

## 2021-03-23 LAB — COMPREHENSIVE METABOLIC PANEL
ALT: 12 IU/L (ref 0–44)
AST: 19 IU/L (ref 0–40)
Albumin/Globulin Ratio: 1.4 (ref 1.2–2.2)
Albumin: 4.2 g/dL (ref 3.8–4.9)
Alkaline Phosphatase: 92 IU/L (ref 44–121)
BUN/Creatinine Ratio: 28 — ABNORMAL HIGH (ref 9–20)
BUN: 16 mg/dL (ref 6–24)
Bilirubin Total: <0.2 mg/dL (ref 0.0–1.2)
CO2: 23 mmol/L (ref 20–29)
Calcium: 9.4 mg/dL (ref 8.7–10.2)
Chloride: 100 mmol/L (ref 96–106)
Creatinine, Ser: 0.57 mg/dL — ABNORMAL LOW (ref 0.76–1.27)
Globulin, Total: 3 g/dL (ref 1.5–4.5)
Glucose: 77 mg/dL (ref 65–99)
Potassium: 4.1 mmol/L (ref 3.5–5.2)
Sodium: 140 mmol/L (ref 134–144)
Total Protein: 7.2 g/dL (ref 6.0–8.5)
eGFR: 119 mL/min/{1.73_m2} (ref >59)

## 2021-03-23 LAB — LIPID PANEL
Chol/HDL Ratio: 2.7 ratio (ref 0.0–5.0)
Cholesterol, Total: 220 mg/dL — ABNORMAL HIGH (ref 100–199)
HDL: 81 mg/dL (ref >39)
LDL Chol Calc (NIH): 108 mg/dL — ABNORMAL HIGH (ref 0–99)
Triglycerides: 182 mg/dL — ABNORMAL HIGH (ref 0–149)
VLDL Cholesterol Cal: 31 mg/dL (ref 5–40)

## 2021-03-23 NOTE — Progress Notes (Signed)
Please let the patient know that his liver, kidneys and electrolytes look good. His white blood cell count and red blood cell counts look good. However, his cholesterol is still elevated.  Taking his medication consistently should help with this.  Also, recommend following a low fat diet. Please remind patient he needs to follow up in the heart failure clinic as soon as possible.

## 2021-03-23 NOTE — Telephone Encounter (Signed)
LVM with patient in attempt to schedule a CHF follow up per patiens PCP.   Jose [name], NT

## 2021-04-01 ENCOUNTER — Ambulatory Visit: Payer: 59 | Admitting: Nurse Practitioner

## 2021-04-01 NOTE — Progress Notes (Deleted)
There were no vitals taken for this visit.   Subjective:    Patient ID: Jose [name]., male    DOB: 1971-09-13, 50 y.o.   MRN: [id number]  HPI: Jose [name]. is a 50 y.o. male  No chief complaint on file.  HYPERTENSION Hypertension status: {Blank single:19197::"controlled","uncontrolled","better","worse","exacerbated","stable"}  Satisfied with current treatment? {Blank single:19197::"yes","no"} Duration of hypertension: {Blank single:19197::"chronic","months","years"} BP monitoring frequency:  {Blank single:19197::"not checking","rarely","daily","weekly","monthly","a few times a day","a few times a week","a few times a month"} BP range:  BP medication side effects:  {Blank single:19197::"yes","no"} Medication compliance: {Blank single:19197::"excellent compliance","good compliance","fair compliance","poor compliance"} Previous BP meds:{Blank [id number]::"QPRF","FMBWGYKZLD","JTTSVXBLTJ/QZESPQZRAQ","TMAUQJFH","LKTGYBWLSL","HTDSKAJGOT/LXBW","IOMBTDHRCB (bystolic)","carvedilol","chlorthalidone","clonidine","diltiazem","exforge HCT","HCTZ","irbesartan (avapro)","labetalol","lisinopril","lisinopril-HCTZ","losartan (cozaar)","methyldopa","nifedipine","olmesartan (benicar)","olmesartan-HCTZ","quinapril","ramipril","spironalactone","tekturna","valsartan","valsartan-HCTZ","verapamil"} Aspirin: {Blank single:19197::"yes","no"} Recurrent headaches: {Blank single:19197::"yes","no"} Visual changes: {Blank single:19197::"yes","no"} Palpitations: {Blank single:19197::"yes","no"} Dyspnea: {Blank single:19197::"yes","no"} Chest pain: {Blank single:19197::"yes","no"} Lower extremity edema: {Blank single:19197::"yes","no"} Dizzy/lightheaded: {Blank single:19197::"yes","no"}   Relevant past medical, surgical, family and social history reviewed and updated as indicated. Interim medical history since our last visit reviewed. Allergies and medications reviewed and updated.  Review of Systems  Per  HPI unless specifically indicated above     Objective:    There were no vitals taken for this visit.  Wt Readings from Last 3 Encounters:  03/22/21 218 lb 3.2 oz (99 kg)  02/18/21 222 lb (100.7 kg)  09/07/20 212 lb 12.8 oz (96.5 kg)    Physical Exam  Results for orders placed or performed in visit on 03/22/21  Comp Met (CMET)  Result Value Ref Range   Glucose 77 65 - 99 mg/dL   BUN 16 6 - 24 mg/dL   Creatinine, Ser 0.57 (L) 0.76 - 1.27 mg/dL   eGFR 119 >59 mL/min/1.73   BUN/Creatinine Ratio 28 (H) 9 - 20   Sodium 140 134 - 144 mmol/L   Potassium 4.1 3.5 - 5.2 mmol/L   Chloride 100 96 - 106 mmol/L   CO2 23 20 - 29 mmol/L   Calcium 9.4 8.7 - 10.2 mg/dL   Total Protein 7.2 6.0 - 8.5 g/dL   Albumin 4.2 3.8 - 4.9 g/dL   Globulin, Total 3.0 1.5 - 4.5 g/dL   Albumin/Globulin Ratio 1.4 1.2 - 2.2   Bilirubin Total <0.2 0.0 - 1.2 mg/dL   Alkaline Phosphatase 92 44 - 121 IU/L   AST 19 0 - 40 IU/L   ALT 12 0 - 44 IU/L  CBC w/Diff  Result Value Ref Range   WBC 6.2 3.4 - 10.8 x10E3/uL   RBC 4.51 4.14 - 5.80 x10E6/uL   Hemoglobin 13.9 13.0 - 17.7 g/dL   Hematocrit 39.7 37.5 - 51.0 %   MCV 88 79 - 97 fL   MCH 30.8 26.6 - 33.0 pg   MCHC 35.0 31.5 - 35.7 g/dL   RDW 12.8 11.6 - 15.4 %   Platelets 152 150 - 450 x10E3/uL   Neutrophils 53 Not Estab. %   Lymphs 33 Not Estab. %   Monocytes 10 Not Estab. %   Eos 2 Not Estab. %   Basos 1 Not Estab. %   Neutrophils Absolute 3.3 1.4 - 7.0 x10E3/uL   Lymphocytes Absolute 2.0 0.7 - 3.1 x10E3/uL   Monocytes Absolute 0.6 0.1 - 0.9 x10E3/uL   EOS (ABSOLUTE) 0.1 0.0 - 0.4 x10E3/uL   Basophils Absolute 0.1 0.0 - 0.2 x10E3/uL   Immature Granulocytes 1 Not Estab. %   Immature Grans (Abs) 0.0 0.0 - 0.1 x10E3/uL  Lipid Profile  Result Value Ref Range   Cholesterol, Total 220 (H) 100 - 199 mg/dL  Triglycerides 182 (H) 0 - 149 mg/dL   HDL 81 >39 mg/dL   VLDL Cholesterol Cal 31 5 - 40 mg/dL   LDL Chol Calc (NIH) 108 (H) 0 - 99 mg/dL   Chol/HDL  Ratio 2.7 0.0 - 5.0 ratio      Assessment & Plan:   Problem List Items Addressed This Visit   None      Follow up plan: No follow-ups on file.

## 2021-04-19 ENCOUNTER — Ambulatory Visit: Payer: 59 | Admitting: Nurse Practitioner

## 2021-04-23 ENCOUNTER — Other Ambulatory Visit: Payer: Self-pay

## 2021-04-23 ENCOUNTER — Ambulatory Visit: Payer: 59 | Admitting: Nurse Practitioner

## 2021-04-23 ENCOUNTER — Encounter: Payer: Self-pay | Admitting: Nurse Practitioner

## 2021-04-23 VITALS — BP 146/89 | HR 83 | Temp 98.7°F | Ht 74.02 in | Wt 218.2 lb

## 2021-04-23 DIAGNOSIS — I10 Essential (primary) hypertension: Secondary | ICD-10-CM | POA: Diagnosis not present

## 2021-04-23 MED ORDER — AMLODIPINE BESYLATE 10 MG PO TABS: 10.0000 mg | ORAL_TABLET | Freq: Every day | ORAL | 0 refills | Status: DC

## 2021-04-23 NOTE — Progress Notes (Signed)
BP (!) 146/89   Pulse 83   Temp 98.7 F (37.1 C)   Ht 6' 2.02" (1.88 m)   Wt 218 lb 3.2 oz (99 kg)   SpO2 95%   BMI 28.00 kg/m    Subjective:    Patient ID: Jose [name]., male    DOB: May 17, 1971, 50 y.o.   MRN: [id number]  HPI: Jose [name]. is a 50 y.o. male  Chief Complaint  Patient presents with   Hypertension    HYPERTENSION Hypertension status: uncontrolled  Satisfied with current treatment? yes Duration of hypertension: years BP monitoring frequency:  weekly BP range: 140/80 BP medication side effects:  no Medication compliance: poor compliance Previous BP meds: lasix, Imdur, amlodipine, and carvedilol Aspirin: no Recurrent headaches: no Visual changes: no Palpitations: no Dyspnea: no Chest pain: no Lower extremity edema: no Dizzy/lightheaded: no   Relevant past medical, surgical, family and social history reviewed and updated as indicated. Interim medical history since our last visit reviewed. Allergies and medications reviewed and updated.  Review of Systems  Eyes:  Negative for visual disturbance.  Respiratory:  Negative for shortness of breath.   Cardiovascular:  Negative for chest pain and leg swelling.  Neurological:  Negative for light-headedness and headaches.   Per HPI unless specifically indicated above     Objective:    BP (!) 146/89   Pulse 83   Temp 98.7 F (37.1 C)   Ht 6' 2.02" (1.88 m)   Wt 218 lb 3.2 oz (99 kg)   SpO2 95%   BMI 28.00 kg/m   Wt Readings from Last 3 Encounters:  04/23/21 218 lb 3.2 oz (99 kg)  03/22/21 218 lb 3.2 oz (99 kg)  02/18/21 222 lb (100.7 kg)    Physical Exam Vitals and nursing note reviewed.  Constitutional:      General: He is not in acute distress.    Appearance: Normal appearance. He is not ill-appearing, toxic-appearing or diaphoretic.  HENT:     Head: Normocephalic.     Right Ear: External ear normal.     Left Ear: External ear normal.     Nose: Nose normal. No congestion or  rhinorrhea.     Mouth/Throat:     Mouth: Mucous membranes are moist.  Eyes:     General:        Right eye: No discharge.        Left eye: No discharge.     Extraocular Movements: Extraocular movements intact.     Conjunctiva/sclera: Conjunctivae normal.     Pupils: Pupils are equal, round, and reactive to light.  Cardiovascular:     Rate and Rhythm: Normal rate and regular rhythm.     Heart sounds: No murmur heard. Pulmonary:     Effort: Pulmonary effort is normal. No respiratory distress.     Breath sounds: Normal breath sounds. No wheezing, rhonchi or rales.  Abdominal:     General: Abdomen is flat. Bowel sounds are normal.  Musculoskeletal:     Cervical back: Normal range of motion and neck supple.  Skin:    General: Skin is warm and dry.     Capillary Refill: Capillary refill takes less than 2 seconds.  Neurological:     General: No focal deficit present.     Mental Status: He is alert and oriented to person, place, and time.  Psychiatric:        Mood and Affect: Mood normal.  Behavior: Behavior normal.        Thought Content: Thought content normal.        Judgment: Judgment normal.    Results for orders placed or performed in visit on 03/22/21  Comp Met (CMET)  Result Value Ref Range   Glucose 77 65 - 99 mg/dL   BUN 16 6 - 24 mg/dL   Creatinine, Ser 0.57 (L) 0.76 - 1.27 mg/dL   eGFR 119 >59 mL/min/1.73   BUN/Creatinine Ratio 28 (H) 9 - 20   Sodium 140 134 - 144 mmol/L   Potassium 4.1 3.5 - 5.2 mmol/L   Chloride 100 96 - 106 mmol/L   CO2 23 20 - 29 mmol/L   Calcium 9.4 8.7 - 10.2 mg/dL   Total Protein 7.2 6.0 - 8.5 g/dL   Albumin 4.2 3.8 - 4.9 g/dL   Globulin, Total 3.0 1.5 - 4.5 g/dL   Albumin/Globulin Ratio 1.4 1.2 - 2.2   Bilirubin Total <0.2 0.0 - 1.2 mg/dL   Alkaline Phosphatase 92 44 - 121 IU/L   AST 19 0 - 40 IU/L   ALT 12 0 - 44 IU/L  CBC w/Diff  Result Value Ref Range   WBC 6.2 3.4 - 10.8 x10E3/uL   RBC 4.51 4.14 - 5.80 x10E6/uL    Hemoglobin 13.9 13.0 - 17.7 g/dL   Hematocrit 39.7 37.5 - 51.0 %   MCV 88 79 - 97 fL   MCH 30.8 26.6 - 33.0 pg   MCHC 35.0 31.5 - 35.7 g/dL   RDW 12.8 11.6 - 15.4 %   Platelets 152 150 - 450 x10E3/uL   Neutrophils 53 Not Estab. %   Lymphs 33 Not Estab. %   Monocytes 10 Not Estab. %   Eos 2 Not Estab. %   Basos 1 Not Estab. %   Neutrophils Absolute 3.3 1.4 - 7.0 x10E3/uL   Lymphocytes Absolute 2.0 0.7 - 3.1 x10E3/uL   Monocytes Absolute 0.6 0.1 - 0.9 x10E3/uL   EOS (ABSOLUTE) 0.1 0.0 - 0.4 x10E3/uL   Basophils Absolute 0.1 0.0 - 0.2 x10E3/uL   Immature Granulocytes 1 Not Estab. %   Immature Grans (Abs) 0.0 0.0 - 0.1 x10E3/uL  Lipid Profile  Result Value Ref Range   Cholesterol, Total 220 (H) 100 - 199 mg/dL   Triglycerides 182 (H) 0 - 149 mg/dL   HDL 81 >39 mg/dL   VLDL Cholesterol Cal 31 5 - 40 mg/dL   LDL Chol Calc (NIH) 108 (H) 0 - 99 mg/dL   Chol/HDL Ratio 2.7 0.0 - 5.0 ratio      Assessment & Plan:   Problem List Items Addressed This Visit       Cardiovascular and Mediastinum   Hypertension - Primary    Chronic.  Uncontrolled. Improved from last visit. Patient has been taking his medications daily. Will increase Amlodipine to 77m daily. Continue to check blood pressures at home. Discussed the importance of following up with HF clinic.  Return in 2 months for reevaluation.       Relevant Medications   amLODipine (NORVASC) 10 MG tablet     Follow up plan: Return in about 2 months (around 06/23/2021) for BP Check.   A total of 30 minutes were spent on this encounter today.  When total time is documented, this includes both the face-to-face and non-face-to-face time personally spent before, during and after the visit on the date of the encounter.

## 2021-04-23 NOTE — Assessment & Plan Note (Signed)
Chronic.  Uncontrolled. Improved from last visit. Patient has been taking his medications daily. Will increase Amlodipine to 10mg  daily. Continue to check blood pressures at home. Discussed the importance of following up with HF clinic.  Return in 2 months for reevaluation.

## 2021-06-28 ENCOUNTER — Encounter: Payer: Self-pay | Admitting: Nurse Practitioner

## 2021-06-28 ENCOUNTER — Other Ambulatory Visit: Payer: Self-pay

## 2021-06-28 ENCOUNTER — Ambulatory Visit: Payer: 59 | Admitting: Nurse Practitioner

## 2021-06-28 VITALS — BP 212/112 | HR 78 | Temp 98.9°F | Ht 68.62 in | Wt 216.1 lb

## 2021-06-28 DIAGNOSIS — Z1211 Encounter for screening for malignant neoplasm of colon: Secondary | ICD-10-CM

## 2021-06-28 DIAGNOSIS — I10 Essential (primary) hypertension: Secondary | ICD-10-CM | POA: Diagnosis not present

## 2021-06-28 MED ORDER — CARVEDILOL 25 MG PO TABS: 25.0000 mg | ORAL_TABLET | Freq: Two times a day (BID) | ORAL | 0 refills | Status: DC

## 2021-06-28 MED ORDER — AMLODIPINE BESYLATE 10 MG PO TABS: 10.0000 mg | ORAL_TABLET | Freq: Every day | ORAL | 0 refills | Status: DC

## 2021-06-28 MED ORDER — ROSUVASTATIN CALCIUM 20 MG PO TABS: 20.0000 mg | ORAL_TABLET | Freq: Every day | ORAL | 0 refills | Status: DC

## 2021-06-28 MED ORDER — HYDRALAZINE HCL 50 MG PO TABS: 50.0000 mg | ORAL_TABLET | Freq: Three times a day (TID) | ORAL | 1 refills | Status: DC

## 2021-06-28 MED ORDER — LISINOPRIL 40 MG PO TABS: 40.0000 mg | ORAL_TABLET | Freq: Every day | ORAL | 0 refills | Status: DC

## 2021-06-28 MED ORDER — FUROSEMIDE 20 MG PO TABS: 20.0000 mg | ORAL_TABLET | Freq: Every day | ORAL | 0 refills | Status: DC

## 2021-06-28 MED ORDER — POTASSIUM CHLORIDE CRYS ER 20 MEQ PO TBCR: 20.0000 meq | EXTENDED_RELEASE_TABLET | Freq: Every day | ORAL | 0 refills | Status: DC

## 2021-06-28 MED ORDER — ISOSORBIDE MONONITRATE ER 30 MG PO TB24: 30.0000 mg | ORAL_TABLET | Freq: Every day | ORAL | 0 refills | Status: DC

## 2021-06-28 NOTE — Progress Notes (Signed)
BP (!) 212/112   Pulse 78   Temp 98.9 F (37.2 C)   Ht 5' 8.62" (1.743 m)   Wt 216 lb 2 oz (98 kg)   SpO2 94%   BMI 32.27 kg/m    Subjective:    Patient ID: Jose [name]., male    DOB: 1971/04/26, 50 y.o.   MRN: [id number]  HPI: Jose [name]. is a 50 y.o. male  Chief Complaint  Patient presents with   Hypertension    Patient needs a refill on all of his medications, he has been out for about 4-5 days   Knee Pain    Right knee, injured about a 1.5 years ago, dog chain wrapped around his knee and flipped him, pain comes and goes now   HYPERTENSION Hypertension status: controlled  Satisfied with current treatment? yes Duration of hypertension: years BP monitoring frequency:  not checking BP range:  BP medication side effects:  no Medication compliance: excellent compliance Previous BP meds: hydralazine, amlodipine, carvedilol, and lisinopril Aspirin: no Recurrent headaches: no Visual changes: no Palpitations: no Dyspnea: no Chest pain: no Lower extremity edema: no Dizzy/lightheaded: no   Relevant past medical, surgical, family and social history reviewed and updated as indicated. Interim medical history since our last visit reviewed. Allergies and medications reviewed and updated.  Review of Systems  Eyes:  Negative for visual disturbance.  Respiratory:  Negative for shortness of breath.   Cardiovascular:  Negative for chest pain and leg swelling.  Neurological:  Negative for light-headedness and headaches.   Per HPI unless specifically indicated above     Objective:    BP (!) 212/112   Pulse 78   Temp 98.9 F (37.2 C)   Ht 5' 8.62" (1.743 m)   Wt 216 lb 2 oz (98 kg)   SpO2 94%   BMI 32.27 kg/m   Wt Readings from Last 3 Encounters:  06/28/21 216 lb 2 oz (98 kg)  04/23/21 218 lb 3.2 oz (99 kg)  03/22/21 218 lb 3.2 oz (99 kg)    Physical Exam Vitals and nursing note reviewed.  Constitutional:      General: He is not in acute distress.     Appearance: Normal appearance. He is not ill-appearing, toxic-appearing or diaphoretic.  HENT:     Head: Normocephalic.     Right Ear: External ear normal.     Left Ear: External ear normal.     Nose: Nose normal. No congestion or rhinorrhea.     Mouth/Throat:     Mouth: Mucous membranes are moist.  Eyes:     General:        Right eye: No discharge.        Left eye: No discharge.     Extraocular Movements: Extraocular movements intact.     Conjunctiva/sclera: Conjunctivae normal.     Pupils: Pupils are equal, round, and reactive to light.  Cardiovascular:     Rate and Rhythm: Normal rate and regular rhythm.     Heart sounds: No murmur heard. Pulmonary:     Effort: Pulmonary effort is normal. No respiratory distress.     Breath sounds: Normal breath sounds. No wheezing, rhonchi or rales.  Abdominal:     General: Abdomen is flat. Bowel sounds are normal.  Musculoskeletal:     Cervical back: Normal range of motion and neck supple.  Skin:    General: Skin is warm and dry.     Capillary Refill: Capillary refill takes less  than 2 seconds.  Neurological:     General: No focal deficit present.     Mental Status: He is alert and oriented to person, place, and time.  Psychiatric:        Mood and Affect: Mood normal.        Behavior: Behavior normal.        Thought Content: Thought content normal.        Judgment: Judgment normal.    Results for orders placed or performed in visit on 03/22/21  Comp Met (CMET)  Result Value Ref Range   Glucose 77 65 - 99 mg/dL   BUN 16 6 - 24 mg/dL   Creatinine, Ser 0.57 (L) 0.76 - 1.27 mg/dL   eGFR 119 >59 mL/min/1.73   BUN/Creatinine Ratio 28 (H) 9 - 20   Sodium 140 134 - 144 mmol/L   Potassium 4.1 3.5 - 5.2 mmol/L   Chloride 100 96 - 106 mmol/L   CO2 23 20 - 29 mmol/L   Calcium 9.4 8.7 - 10.2 mg/dL   Total Protein 7.2 6.0 - 8.5 g/dL   Albumin 4.2 3.8 - 4.9 g/dL   Globulin, Total 3.0 1.5 - 4.5 g/dL   Albumin/Globulin Ratio 1.4 1.2 - 2.2    Bilirubin Total <0.2 0.0 - 1.2 mg/dL   Alkaline Phosphatase 92 44 - 121 IU/L   AST 19 0 - 40 IU/L   ALT 12 0 - 44 IU/L  CBC w/Diff  Result Value Ref Range   WBC 6.2 3.4 - 10.8 x10E3/uL   RBC 4.51 4.14 - 5.80 x10E6/uL   Hemoglobin 13.9 13.0 - 17.7 g/dL   Hematocrit 39.7 37.5 - 51.0 %   MCV 88 79 - 97 fL   MCH 30.8 26.6 - 33.0 pg   MCHC 35.0 31.5 - 35.7 g/dL   RDW 12.8 11.6 - 15.4 %   Platelets 152 150 - 450 x10E3/uL   Neutrophils 53 Not Estab. %   Lymphs 33 Not Estab. %   Monocytes 10 Not Estab. %   Eos 2 Not Estab. %   Basos 1 Not Estab. %   Neutrophils Absolute 3.3 1.4 - 7.0 x10E3/uL   Lymphocytes Absolute 2.0 0.7 - 3.1 x10E3/uL   Monocytes Absolute 0.6 0.1 - 0.9 x10E3/uL   EOS (ABSOLUTE) 0.1 0.0 - 0.4 x10E3/uL   Basophils Absolute 0.1 0.0 - 0.2 x10E3/uL   Immature Granulocytes 1 Not Estab. %   Immature Grans (Abs) 0.0 0.0 - 0.1 x10E3/uL  Lipid Profile  Result Value Ref Range   Cholesterol, Total 220 (H) 100 - 199 mg/dL   Triglycerides 182 (H) 0 - 149 mg/dL   HDL 81 >39 mg/dL   VLDL Cholesterol Cal 31 5 - 40 mg/dL   LDL Chol Calc (NIH) 108 (H) 0 - 99 mg/dL   Chol/HDL Ratio 2.7 0.0 - 5.0 ratio      Assessment & Plan:   Problem List Items Addressed This Visit       Cardiovascular and Mediastinum   Hypertension - Primary    Chronic. Uncontrolled.  Patient has been out of medication for a couple of days. Refilled all blood pressure medications.  Will increase hydralazine from 83m TID to 573mTID. Follow up in 1 month for reevaluation.       Relevant Medications   hydrALAZINE (APRESOLINE) 50 MG tablet   amLODipine (NORVASC) 10 MG tablet   carvedilol (COREG) 25 MG tablet   furosemide (LASIX) 20 MG tablet   isosorbide mononitrate (IMDUR)  30 MG 24 hr tablet   lisinopril (ZESTRIL) 40 MG tablet   rosuvastatin (CRESTOR) 20 MG tablet   Other Visit Diagnoses     Screening for colon cancer       Relevant Orders   Ambulatory referral to Gastroenterology         Follow up plan: Return in about 1 month (around 07/29/2021) for BP Check.

## 2021-06-28 NOTE — Assessment & Plan Note (Signed)
Chronic. Uncontrolled.  Patient has been out of medication for a couple of days. Refilled all blood pressure medications.  Will increase hydralazine from '25mg'$  TID to '50mg'$  TID. Follow up in 1 month for reevaluation.

## 2021-07-05 ENCOUNTER — Telehealth: Payer: Self-pay | Admitting: Nurse Practitioner

## 2021-07-05 DIAGNOSIS — M25561 Pain in right knee: Secondary | ICD-10-CM

## 2021-07-05 MED ORDER — PREDNISONE 10 MG PO TABS: 10.0000 mg | ORAL_TABLET | Freq: Every day | ORAL | 0 refills | Status: DC

## 2021-07-05 NOTE — Telephone Encounter (Signed)
Called patient to discuss which knee was bothering him, no answer, left a voicemail for patient to return my call.

## 2021-07-05 NOTE — Telephone Encounter (Signed)
Pt called back and I asked him which knee/ he stated the RIGHT knee/ I was able to reach office / he asked that Jonelle Sidle return his call during his break at 2pm to advise about the Xray and steroids/ please advise

## 2021-07-05 NOTE — Telephone Encounter (Signed)
Pt seen 8/22.  Pt instructed to call back if he was not better concerning his knee pain. Pt states he is not better, still in a lot of pain. Pt would like to know what Santiago Glad have him do now? Cb [phone]  Or home [phone]

## 2021-07-05 NOTE — Telephone Encounter (Signed)
I will order an xray for patient and steroids.  Can you verify which knee it is so I can order the xray?

## 2021-07-05 NOTE — Telephone Encounter (Signed)
Xray ordered. Prednisone sent to the pharmacy.  I will let patient know what results say when we get them back.

## 2021-07-05 NOTE — Telephone Encounter (Signed)
Routing to provider to advise patient.

## 2021-07-06 NOTE — Telephone Encounter (Signed)
Pt aware xray has been ordered.  Also advised prednisone Rx at the pharmacy. Pt verbalized understanding.  Nothing further needed.

## 2021-07-08 NOTE — Telephone Encounter (Signed)
Patient called in to inform Jose [name] that he called [facility] and they told him they have not received orders for his X ray. Asking for this to be done please at Ph# [phone] or  [phone]

## 2021-07-09 ENCOUNTER — Ambulatory Visit
Admission: RE | Admit: 2021-07-09 | Discharge: 2021-07-09 | Disposition: A | Discharge status: Home / Self Care | Payer: 59 | Source: Ambulatory Visit | Attending: Nurse Practitioner | Admitting: Nurse Practitioner

## 2021-07-09 ENCOUNTER — Ambulatory Visit
Admission: RE | Admit: 2021-07-09 | Discharge: 2021-07-09 | Disposition: A | Discharge status: Home / Self Care | Payer: 59 | Attending: Nurse Practitioner | Admitting: Nurse Practitioner

## 2021-07-09 DIAGNOSIS — M25561 Pain in right knee: Secondary | ICD-10-CM | POA: Insufficient documentation

## 2021-07-09 NOTE — Telephone Encounter (Signed)
Called over to the outpatient imaging center and confirmed that x-ray order is in.   Called and LVM asking for patient to please return my call.

## 2021-07-09 NOTE — Telephone Encounter (Signed)
Pt aware [place] called [facility] and confirmed the order for his knee xray is in. [facility] advised the pt if he can not get there before 5 pm when they close, he can go to the hospital to have it done. That is why he called the [facility].  The hospital told him he would have to have an appt, but they did not see an order.  Pt is going to get off work early and go to the outpt imaging center Nothing further needed

## 2021-07-13 ENCOUNTER — Ambulatory Visit: Payer: Self-pay

## 2021-07-13 ENCOUNTER — Telehealth: Payer: Self-pay | Admitting: Nurse Practitioner

## 2021-07-13 DIAGNOSIS — M25561 Pain in right knee: Secondary | ICD-10-CM

## 2021-07-13 NOTE — Addendum Note (Signed)
Addended by: Jon Billings on: 07/13/2021 04:07 PM   Modules accepted: Orders

## 2021-07-13 NOTE — Progress Notes (Signed)
Please let patient know that his knee xray way normal.  If the pain is still present, I recommend he see Orthopedics.  Please let me know if he has any questions.

## 2021-07-13 NOTE — Telephone Encounter (Signed)
Routing to provider and providers in office to advise.

## 2021-07-13 NOTE — Telephone Encounter (Signed)
Pt is calling for x-ray results- Pt is calling for the results of right knee x-ray.  Pt reports he is extreme pain. And is wanting advice what he should do.  Please advise [phone]

## 2021-07-13 NOTE — Telephone Encounter (Signed)
  Pt c/o right knee severe pain and swelling. Pain radiates up to the right groin.Pt also c/o calf pain and warmth to the area of pain. Pt describes a sharp pain. Onset was 3 weeks ago. Pt stated that he wants to wait until Wednesday to be evaluated. Advised pt of the complications of a potential DVT.         Reason for Disposition  [1] Thigh or calf pain AND [2] only 1 side AND [3] present > 1 hour  Answer Assessment - Initial Assessment Questions 1. LOCATION and RADIATION: "Where is the pain located?"      Whole knee to the groin 2. QUALITY: "What does the pain feel like?"  (e.g., sharp, dull, aching, burning)     Severe- sharp 3. SEVERITY: "How bad is the pain?" "What does it keep you from doing?"   (Scale 1-10; or mild, moderate, severe)   -  MILD (1-3): doesn't interfere with normal activities    -  MODERATE (4-7): interferes with normal activities (e.g., work or school) or awakens from sleep, limping    -  SEVERE (8-10): excruciating pain, unable to do any normal activities, unable to walk     severe 4. ONSET: "When did the pain start?" "Does it come and go, or is it there all the time?"     3 weeks ago-constant 5. RECURRENT: "Have you had this pain before?" If Yes, ask: "When, and what happened then?"     no 6. 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 AGGRAVATING FACTORS: "What makes the knee pain worse?" (e.g., walking, climbing stairs, running)     Always present 8. ASSOCIATED SYMPTOMS: "Is there any swelling or redness of the knee?"     Swelling not able bear weight 9. OTHER SYMPTOMS: "Do you have any other symptoms?" (e.g., chest pain, difficulty breathing, fever, calf pain)     Calf pain 10. PREGNANCY: "Is there any chance you are pregnant?" "When was your last menstrual period?"       N/a  Protocols used: Knee Pain-A-AH

## 2021-07-13 NOTE — Telephone Encounter (Signed)
Called and LVM asking for patient to please return my call.  

## 2021-07-13 NOTE — Telephone Encounter (Signed)
Called and LVM asking for patient to please return my call. See result note for further documentation.

## 2021-07-13 NOTE — Telephone Encounter (Signed)
Results provided to pt of right knee xray pt would like ortho. referral

## 2021-07-13 NOTE — Telephone Encounter (Signed)
Called and left VM's on both numbers listed in the patient chart. Will try to call again.

## 2021-07-14 ENCOUNTER — Telehealth: Payer: Self-pay

## 2021-07-14 NOTE — Telephone Encounter (Signed)
Copied from [place] [id number]. Topic: Quick Communication - See Telephone Encounter >> Jul 14, 2021 11:41 AM Loma [place] wrote: CRM for notification. See Telephone encounter for: 07/14/21. Pt had called yesterday re; referral for otho... looks as if order placed but pt wants to know who it was sent to and if he can just call as he is in a lot of pain. [phone] pls leave him a detailed message   See other telephone encounter. Patient was given referral information.

## 2021-07-14 NOTE — Telephone Encounter (Signed)
Called and discussed with patient. He states that he was not able to make it to UC yesterday. Also advised patient that his referral was sent to [facility], demographics for their location provided as well. Also let patient know about their walk in clinic as well.

## 2021-07-28 ENCOUNTER — Ambulatory Visit: Payer: 59 | Admitting: Nurse Practitioner

## 2021-08-04 ENCOUNTER — Ambulatory Visit: Payer: 59 | Admitting: Nurse Practitioner

## 2021-08-04 ENCOUNTER — Other Ambulatory Visit: Payer: Self-pay

## 2021-08-04 ENCOUNTER — Encounter: Payer: Self-pay | Admitting: Nurse Practitioner

## 2021-08-04 VITALS — BP 140/80 | HR 81 | Ht 68.0 in | Wt 216.0 lb

## 2021-08-04 DIAGNOSIS — R102 Pelvic and perineal pain: Secondary | ICD-10-CM | POA: Diagnosis not present

## 2021-08-04 DIAGNOSIS — I10 Essential (primary) hypertension: Secondary | ICD-10-CM | POA: Diagnosis not present

## 2021-08-04 MED ORDER — CARVEDILOL 25 MG PO TABS: 25.0000 mg | ORAL_TABLET | Freq: Two times a day (BID) | ORAL | 1 refills | Status: DC

## 2021-08-04 MED ORDER — CARBAMAZEPINE 200 MG PO TABS: 200.0000 mg | ORAL_TABLET | Freq: Two times a day (BID) | ORAL | 1 refills | Status: DC

## 2021-08-04 MED ORDER — FUROSEMIDE 20 MG PO TABS: 20.0000 mg | ORAL_TABLET | Freq: Every day | ORAL | 1 refills | Status: DC

## 2021-08-04 MED ORDER — LISINOPRIL 40 MG PO TABS: 40.0000 mg | ORAL_TABLET | Freq: Every day | ORAL | 1 refills | Status: DC

## 2021-08-04 MED ORDER — AMLODIPINE BESYLATE 10 MG PO TABS: 10.0000 mg | ORAL_TABLET | Freq: Every day | ORAL | 1 refills | Status: DC

## 2021-08-04 MED ORDER — ISOSORBIDE MONONITRATE ER 30 MG PO TB24: 30.0000 mg | ORAL_TABLET | Freq: Every day | ORAL | 1 refills | Status: DC

## 2021-08-04 MED ORDER — ROSUVASTATIN CALCIUM 20 MG PO TABS: 20.0000 mg | ORAL_TABLET | Freq: Every day | ORAL | 1 refills | Status: DC

## 2021-08-04 MED ORDER — POTASSIUM CHLORIDE CRYS ER 20 MEQ PO TBCR: 20.0000 meq | EXTENDED_RELEASE_TABLET | Freq: Every day | ORAL | 1 refills | Status: DC

## 2021-08-04 NOTE — Assessment & Plan Note (Signed)
Chronic.  Controlled.  Continue with current medication regimen.  Refills sent today.  Labs ordered today.  Likely multifactorial due to CHF and alcohol use.  Discussion had with patient to decrease alcohol use.  Recommend he follow up with HF clinic.  Return to clinic in 3 months for reevaluation.  Call sooner if concerns arise.

## 2021-08-04 NOTE — Progress Notes (Signed)
BP 140/80   Pulse 81   Ht '5\' 8"'  (1.727 m)   Wt 216 lb (98 kg)   BMI 32.84 kg/m    Subjective:    Patient ID: Jose [name]., male    DOB: 27-Mar-1971, 50 y.o.   MRN: [id number]  HPI: Hoyt Leanos. is a 50 y.o. male  Chief Complaint  Patient presents with   Hypertension   HYPERTENSION Hypertension status: controlled  Satisfied with current treatment? yes Duration of hypertension: years BP monitoring frequency:  not checking BP range:  BP medication side effects:  no Medication compliance: excellent compliance Previous BP meds: hydralazine, amlodipine, carvedilol, and lisinopril Aspirin: no Recurrent headaches: no Visual changes: no Palpitations: no Dyspnea: no Chest pain: no Lower extremity edema: no Dizzy/lightheaded: no  Patient states he has pain in his in his right lower groin area. States it only hurts at night. Hurts worse when his knee is hurting him at night.    Relevant past medical, surgical, family and social history reviewed and updated as indicated. Interim medical history since our last visit reviewed. Allergies and medications reviewed and updated.  Review of Systems  Eyes:  Negative for visual disturbance.  Respiratory:  Negative for shortness of breath.   Cardiovascular:  Negative for chest pain and leg swelling.  Neurological:  Negative for light-headedness and headaches.   Per HPI unless specifically indicated above     Objective:    BP 140/80   Pulse 81   Ht '5\' 8"'  (1.727 m)   Wt 216 lb (98 kg)   BMI 32.84 kg/m   Wt Readings from Last 3 Encounters:  08/04/21 216 lb (98 kg)  06/28/21 216 lb 2 oz (98 kg)  04/23/21 218 lb 3.2 oz (99 kg)    Physical Exam Vitals and nursing note reviewed.  Constitutional:      General: He is not in acute distress.    Appearance: Normal appearance. He is not ill-appearing, toxic-appearing or diaphoretic.  HENT:     Head: Normocephalic.     Right Ear: External ear normal.     Left Ear: External ear  normal.     Nose: Nose normal. No congestion or rhinorrhea.     Mouth/Throat:     Mouth: Mucous membranes are moist.  Eyes:     General:        Right eye: No discharge.        Left eye: No discharge.     Extraocular Movements: Extraocular movements intact.     Conjunctiva/sclera: Conjunctivae normal.     Pupils: Pupils are equal, round, and reactive to light.  Cardiovascular:     Rate and Rhythm: Normal rate and regular rhythm.     Heart sounds: No murmur heard. Pulmonary:     Effort: Pulmonary effort is normal. No respiratory distress.     Breath sounds: Normal breath sounds. No wheezing, rhonchi or rales.  Abdominal:     General: Abdomen is flat. Bowel sounds are normal.  Musculoskeletal:     Cervical back: Normal range of motion and neck supple.  Skin:    General: Skin is warm and dry.     Capillary Refill: Capillary refill takes less than 2 seconds.  Neurological:     General: No focal deficit present.     Mental Status: He is alert and oriented to person, place, and time.  Psychiatric:        Mood and Affect: Mood normal.  Behavior: Behavior normal.        Thought Content: Thought content normal.        Judgment: Judgment normal.    Results for orders placed or performed in visit on 03/22/21  Comp Met (CMET)  Result Value Ref Range   Glucose 77 65 - 99 mg/dL   BUN 16 6 - 24 mg/dL   Creatinine, Ser 0.57 (L) 0.76 - 1.27 mg/dL   eGFR 119 >59 mL/min/1.73   BUN/Creatinine Ratio 28 (H) 9 - 20   Sodium 140 134 - 144 mmol/L   Potassium 4.1 3.5 - 5.2 mmol/L   Chloride 100 96 - 106 mmol/L   CO2 23 20 - 29 mmol/L   Calcium 9.4 8.7 - 10.2 mg/dL   Total Protein 7.2 6.0 - 8.5 g/dL   Albumin 4.2 3.8 - 4.9 g/dL   Globulin, Total 3.0 1.5 - 4.5 g/dL   Albumin/Globulin Ratio 1.4 1.2 - 2.2   Bilirubin Total <0.2 0.0 - 1.2 mg/dL   Alkaline Phosphatase 92 44 - 121 IU/L   AST 19 0 - 40 IU/L   ALT 12 0 - 44 IU/L  CBC w/Diff  Result Value Ref Range   WBC 6.2 3.4 - 10.8  x10E3/uL   RBC 4.51 4.14 - 5.80 x10E6/uL   Hemoglobin 13.9 13.0 - 17.7 g/dL   Hematocrit 39.7 37.5 - 51.0 %   MCV 88 79 - 97 fL   MCH 30.8 26.6 - 33.0 pg   MCHC 35.0 31.5 - 35.7 g/dL   RDW 12.8 11.6 - 15.4 %   Platelets 152 150 - 450 x10E3/uL   Neutrophils 53 Not Estab. %   Lymphs 33 Not Estab. %   Monocytes 10 Not Estab. %   Eos 2 Not Estab. %   Basos 1 Not Estab. %   Neutrophils Absolute 3.3 1.4 - 7.0 x10E3/uL   Lymphocytes Absolute 2.0 0.7 - 3.1 x10E3/uL   Monocytes Absolute 0.6 0.1 - 0.9 x10E3/uL   EOS (ABSOLUTE) 0.1 0.0 - 0.4 x10E3/uL   Basophils Absolute 0.1 0.0 - 0.2 x10E3/uL   Immature Granulocytes 1 Not Estab. %   Immature Grans (Abs) 0.0 0.0 - 0.1 x10E3/uL  Lipid Profile  Result Value Ref Range   Cholesterol, Total 220 (H) 100 - 199 mg/dL   Triglycerides 182 (H) 0 - 149 mg/dL   HDL 81 >39 mg/dL   VLDL Cholesterol Cal 31 5 - 40 mg/dL   LDL Chol Calc (NIH) 108 (H) 0 - 99 mg/dL   Chol/HDL Ratio 2.7 0.0 - 5.0 ratio      Assessment & Plan:   Problem List Items Addressed This Visit       Cardiovascular and Mediastinum   Hypertension - Primary    Chronic.  Controlled.  Continue with current medication regimen.  Refills sent today.  Labs ordered today.  Likely multifactorial due to CHF and alcohol use.  Discussion had with patient to decrease alcohol use.  Recommend he follow up with HF clinic.  Return to clinic in 3 months for reevaluation.  Call sooner if concerns arise.        Relevant Medications   furosemide (LASIX) 20 MG tablet   amLODipine (NORVASC) 10 MG tablet   carvedilol (COREG) 25 MG tablet   isosorbide mononitrate (IMDUR) 30 MG 24 hr tablet   lisinopril (ZESTRIL) 40 MG tablet   rosuvastatin (CRESTOR) 20 MG tablet   Other Visit Diagnoses     Pelvic pain  Will order xray. If negative, will order [place] for evaluation.    Relevant Orders   XR Pelvis 1-2 Views        Follow up plan: Return in about 3 months (around 11/03/2021) for HTN, HLD, DM2  FU.   A total of 30 minutes were spent on this encounter today.  When total time is documented, this includes both the face-to-face and non-face-to-face time personally spent before, during and after the visit on the date of the encounter discussing the importance of seeing HF clinic and pelvic pain plan of care.

## 2021-08-17 DIAGNOSIS — M25561 Pain in right knee: Secondary | ICD-10-CM | POA: Diagnosis not present

## 2021-08-31 NOTE — Progress Notes (Signed)
Patient ID: [name], male    DOB: June 14, 1971, 50 y.o.   MRN: [id number]  Mr Oien is a 50 y/o male with a history of hyperlipidemia, HTN, current tobacco/ alcohol use and chronic heart failure.   Echo report from 01/29/2019 reviewed and showed an EF of 60-65%,  Has not been admitted or been in the ED in the last 6 months.   He presents today for a follow-up visit although hasn't been seen since July 2021. He presents with a chief complaint of minimal fatigue upon moderate exertion. He describes this as chronic in nature having been present for several years. He has right knee pain/ swelling along with this. He denies any difficulty sleeping, dizziness, pedal edema, abdominal distention, palpitations, chest pain, shortness of breath, cough or weight gain.   He says that he's been out of his hydralazine for ~3 weeks and needs a new prescription for this.   His biggest complaint is of right knee pain/ swelling that's been present for a few weeks. Has recently seen ortho who suggested getting his heart checked in case his knee issue was related to his heart. He has to call them back tomorrow to get an MRI scheduled.   Past Medical History:  Diagnosis Date   Alcohol abuse    Cervicalgia    CHF (congestive heart failure) (HCC)    Hyperlipidemia    Hypertension    Seizures ([place])    Past Surgical History:  Procedure Laterality Date   COLONOSCOPY WITH PROPOFOL N/A 01/16/2018   Procedure: COLONOSCOPY WITH PROPOFOL;  Surgeon: Lin Landsman, MD;  Location: [facility] ENDOSCOPY;  Service: Gastroenterology;  Laterality: N/A;   ESOPHAGOGASTRODUODENOSCOPY (EGD) WITH PROPOFOL N/A 01/16/2018   Procedure: ESOPHAGOGASTRODUODENOSCOPY (EGD) WITH PROPOFOL;  Surgeon: Lin Landsman, MD;  Location: [facility] ENDOSCOPY;  Service: Gastroenterology;  Laterality: N/A;   Family History  Problem Relation Age of Onset   Diabetes Mother    Colon cancer Mother    Hypertension Father    Multiple sclerosis Father     Social History   Tobacco Use   Smoking status: Every Day    Packs/day: 0.00    Types: Cigars, Cigarettes   Smokeless tobacco: Former   Tobacco comments:    2 cigars a day  Substance Use Topics   Alcohol use: Yes    Alcohol/week: 28.0 standard drinks    Types: 7 Cans of beer, 21 Shots of liquor per week    Comment: half pint liquor per day   No Known Allergies  Prior to Admission medications   Medication Sig Start Date End Date Taking? Authorizing Provider  amLODipine (NORVASC) 10 MG tablet Take 1 tablet (10 mg total) by mouth daily. 08/04/21  Yes Jon Billings, NP  carbamazepine (TEGRETOL) 200 MG tablet Take 1 tablet (200 mg total) by mouth 2 (two) times daily. 08/04/21  Yes Jon Billings, NP  carvedilol (COREG) 25 MG tablet Take 1 tablet (25 mg total) by mouth 2 (two) times daily with a meal. 08/04/21  Yes Jon Billings, NP  furosemide (LASIX) 20 MG tablet Take 1 tablet (20 mg total) by mouth daily. 08/04/21  Yes Jon Billings, NP  isosorbide mononitrate (IMDUR) 30 MG 24 hr tablet Take 1 tablet (30 mg total) by mouth daily. 08/04/21  Yes Jon Billings, NP  lisinopril (ZESTRIL) 40 MG tablet Take 1 tablet (40 mg total) by mouth daily. 08/04/21 11/02/21 Yes Jon Billings, NP  potassium chloride SA (KLOR-CON) 20 MEQ tablet Take 1 tablet (  20 mEq total) by mouth daily. 08/04/21  Yes Jon Billings, NP  albuterol (VENTOLIN HFA) 108 (90 Base) MCG/ACT inhaler Inhale 2 puffs into the lungs every 6 (six) hours as needed for wheezing or shortness of breath. 02/18/21   Jon Billings, NP  hydrALAZINE (APRESOLINE) 50 MG tablet Take 1 tablet (50 mg total) by mouth 3 (three) times daily. Patient not taking: Reported on 09/01/2021 06/28/21   Jon Billings, NP  rosuvastatin (CRESTOR) 20 MG tablet Take 1 tablet (20 mg total) by mouth daily. 08/04/21   Jon Billings, NP    Review of Systems  Constitutional:  Positive for fatigue (intermittent). Negative for appetite  change.  HENT:  Positive for hearing loss. Negative for congestion and rhinorrhea.   Eyes: Negative.   Respiratory:  Negative for cough, chest tightness and shortness of breath.   Cardiovascular:  Negative for chest pain, palpitations and leg swelling.  Gastrointestinal:  Negative for abdominal distention and abdominal pain.  Endocrine: Negative.   Genitourinary: Negative.   Musculoskeletal:  Positive for arthralgias (right knee). Negative for back pain.  Skin: Negative.   Allergic/Immunologic: Negative.   Neurological:  Negative for dizziness and light-headedness.  Hematological:  Negative for adenopathy. Does not bruise/bleed easily.  Psychiatric/Behavioral:  Negative for dysphoric mood and sleep disturbance. The patient is not nervous/anxious.    Vitals:   09/01/21 1355  BP: (!) 169/107  Pulse: 78  Resp: 18  SpO2: 96%  Weight: 212 lb 8 oz (96.4 kg)  Height: 5\' 10"  (1.778 m)   Wt Readings from Last 3 Encounters:  09/01/21 212 lb 8 oz (96.4 kg)  08/04/21 216 lb (98 kg)  06/28/21 216 lb 2 oz (98 kg)   Lab Results  Component Value Date   CREATININE 0.57 (L) 03/22/2021   CREATININE 1.05 05/16/2020   CREATININE 0.68 (L) 05/12/2020    Physical Exam Vitals and nursing note reviewed.  Constitutional:      Appearance: Normal appearance.  HENT:     Head: Normocephalic and atraumatic.     Right Ear: Decreased hearing noted.     Left Ear: Decreased hearing noted.  Neck:     Vascular: No JVD.  Cardiovascular:     Rate and Rhythm: Normal rate and regular rhythm.  Pulmonary:     Effort: Pulmonary effort is normal.     Breath sounds: No wheezing, rhonchi or rales.  Abdominal:     General: There is no distension.     Palpations: Abdomen is soft.     Tenderness: There is no abdominal tenderness.  Musculoskeletal:        General: Swelling (right knee) present. No tenderness.     Cervical back: Normal range of motion and neck supple.     Right lower leg: No edema.     Left  lower leg: No edema.  Skin:    General: Skin is warm and dry.  Neurological:     General: No focal deficit present.     Mental Status: He is alert and oriented to person, place, and time.  Psychiatric:        Mood and Affect: Mood normal.        Behavior: Behavior normal.    Assessment & Plan:  1. 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 Each medication was verbally reviewed with the patient and he was encouraged to bring the bottles to every visit to confirm accuracy of list.  Return in 6 weeks or sooner for any questions/problems before then.

## 2021-09-01 ENCOUNTER — Ambulatory Visit: Payer: BC Managed Care – PPO | Attending: Family | Admitting: Family

## 2021-09-01 ENCOUNTER — Encounter: Payer: Self-pay | Admitting: Family

## 2021-09-01 ENCOUNTER — Other Ambulatory Visit: Payer: Self-pay

## 2021-09-01 VITALS — BP 169/107 | HR 78 | Resp 18 | Ht 70.0 in | Wt 212.5 lb

## 2021-09-01 DIAGNOSIS — Z8249 Family history of ischemic heart disease and other diseases of the circulatory system: Secondary | ICD-10-CM | POA: Diagnosis not present

## 2021-09-01 DIAGNOSIS — M25561 Pain in right knee: Secondary | ICD-10-CM | POA: Diagnosis not present

## 2021-09-01 DIAGNOSIS — Z79899 Other long term (current) drug therapy: Secondary | ICD-10-CM | POA: Insufficient documentation

## 2021-09-01 DIAGNOSIS — F17290 Nicotine dependence, other tobacco product, uncomplicated: Secondary | ICD-10-CM | POA: Diagnosis not present

## 2021-09-01 DIAGNOSIS — G8929 Other chronic pain: Secondary | ICD-10-CM

## 2021-09-01 DIAGNOSIS — I5032 Chronic diastolic (congestive) heart failure: Secondary | ICD-10-CM

## 2021-09-01 DIAGNOSIS — I110 Hypertensive heart disease with heart failure: Secondary | ICD-10-CM | POA: Diagnosis not present

## 2021-09-01 DIAGNOSIS — F17210 Nicotine dependence, cigarettes, uncomplicated: Secondary | ICD-10-CM | POA: Insufficient documentation

## 2021-09-01 DIAGNOSIS — I10 Essential (primary) hypertension: Secondary | ICD-10-CM

## 2021-09-01 DIAGNOSIS — E785 Hyperlipidemia, unspecified: Secondary | ICD-10-CM | POA: Diagnosis not present

## 2021-09-01 DIAGNOSIS — F1090 Alcohol use, unspecified, uncomplicated: Secondary | ICD-10-CM | POA: Diagnosis not present

## 2021-09-01 DIAGNOSIS — F17200 Nicotine dependence, unspecified, uncomplicated: Secondary | ICD-10-CM

## 2021-09-01 MED ORDER — HYDRALAZINE HCL 50 MG PO TABS: 50.0000 mg | ORAL_TABLET | Freq: Three times a day (TID) | ORAL | 5 refills | Status: DC

## 2021-09-01 NOTE — Patient Instructions (Addendum)
Continue weighing daily and call for an overnight weight gain of > 2 pounds or a weekly weight gain of >5 pounds.  Call us once you know when you will be off work during the holiday season

## 2021-09-02 ENCOUNTER — Other Ambulatory Visit: Payer: Self-pay | Admitting: Family

## 2021-09-02 NOTE — Addendum Note (Signed)
Addended by: Darylene Price A on: 09/02/2021 09:25 AM   Modules accepted: Orders

## 2021-10-12 NOTE — Progress Notes (Signed)
Patient ID: Jose [name]., male    DOB: 04-Nov-1971, 50 y.o.   MRN: [id number]  Jose [name] is a 50 y/o male with a history of hyperlipidemia, HTN, current tobacco/ alcohol use and chronic heart failure.   Echo report from 01/29/2019 reviewed and showed an EF of 60-65%,  Has not been admitted or been in the ED in the last 6 months.   He presents today for a follow-up visit with a chief complaint of minimal fatigue upon moderate exertion. He describes this as chronic in nature having been present for several years. He has associated right knee pain along with this. He denies any dizziness, difficulty sleeping, abdominal distention, palpitations, pedal edema, chest pain, shortness of breath, cough or weight gain.   Says that he's not weighing daily but does have working scales. Also admits to not taking his medications consistently. 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 Numerous medication labels are so faded making it difficult to see some of them. Upon reviewing medication adherence and fill dates, many bottles that should only last a month are lasting 2 months before he fills them again.   Emphasized that he is at risk of a stroke with his continued HTN and asked if there was anything that we could do to help him with his medication and he says that there isn't, he just needs to remember.   Past Medical History:  Diagnosis Date   Alcohol abuse    Cervicalgia    CHF (congestive heart failure) (HCC)    Hyperlipidemia    Hypertension    Seizures ([place])    Past Surgical History:  Procedure Laterality Date   COLONOSCOPY WITH PROPOFOL N/A 01/16/2018   Procedure: COLONOSCOPY WITH PROPOFOL;  Surgeon: Lin Landsman, MD;  Location: [facility] ENDOSCOPY;  Service: Gastroenterology;  Laterality: N/A;   ESOPHAGOGASTRODUODENOSCOPY (EGD) WITH PROPOFOL N/A 01/16/2018   Procedure:  ESOPHAGOGASTRODUODENOSCOPY (EGD) WITH PROPOFOL;  Surgeon: Lin Landsman, MD;  Location: [facility] ENDOSCOPY;  Service: Gastroenterology;  Laterality: N/A;   Family History  Problem Relation Age of Onset   Diabetes Mother    Colon cancer Mother    Hypertension Father    Multiple sclerosis Father    Social History   Tobacco Use   Smoking status: Every Day    Packs/day: 0.00    Types: Cigars, Cigarettes   Smokeless tobacco: Former   Tobacco comments:    2 cigars a day  Substance Use Topics   Alcohol use: Yes    Alcohol/week: 28.0 standard drinks    Types: 7 Cans of beer, 21 Shots of liquor per week    Comment: half pint liquor per day   No Known Allergies  Prior to Admission medications   Medication Sig Start Date End Date Taking? Authorizing Provider  albuterol (VENTOLIN HFA) 108 (90 Base) MCG/ACT inhaler Inhale 2 puffs into the lungs every 6 (six) hours as needed for wheezing or shortness of breath. 02/18/21  Yes Jon Billings, NP  amLODipine (NORVASC) 10 MG tablet Take 1 tablet (10 mg total) by mouth daily. 08/04/21  Yes Jon Billings, NP  carbamazepine (TEGRETOL) 200 MG tablet Take 1 tablet (200 mg total) by mouth 2 (two) times daily. 08/04/21  Yes Jon Billings, NP  carvedilol (COREG) 25 MG tablet Take 1 tablet (25 mg total) by mouth 2 (two) times daily with a meal.  08/04/21  Yes Jon Billings, NP  furosemide (LASIX) 20 MG tablet Take 1 tablet (20 mg total) by mouth daily. 08/04/21  Yes Jon Billings, NP  hydrALAZINE (APRESOLINE) 50 MG tablet Take 1 tablet (50 mg total) by mouth 3 (three) times daily. 09/01/21  Yes Darylene Price A, FNP  isosorbide mononitrate (IMDUR) 30 MG 24 hr tablet Take 1 tablet (30 mg total) by mouth daily. 08/04/21  Yes Jon Billings, NP  lisinopril (ZESTRIL) 40 MG tablet Take 1 tablet (40 mg total) by mouth daily. 08/04/21 11/02/21 Yes Jon Billings, NP  potassium chloride SA (KLOR-CON) 20 MEQ tablet Take 1 tablet (20 mEq total) by  mouth daily. 08/04/21  Yes Jon Billings, NP  rosuvastatin (CRESTOR) 20 MG tablet Take 1 tablet (20 mg total) by mouth daily. 08/04/21  Yes Jon Billings, NP   Review of Systems  Constitutional:  Positive for fatigue (intermittent). Negative for appetite change.  HENT:  Positive for hearing loss. Negative for congestion and rhinorrhea.   Eyes: Negative.   Respiratory:  Negative for cough, chest tightness and shortness of breath.   Cardiovascular:  Negative for chest pain, palpitations and leg swelling.  Gastrointestinal:  Negative for abdominal distention and abdominal pain.  Endocrine: Negative.   Genitourinary: Negative.   Musculoskeletal:  Positive for arthralgias (right knee). Negative for back pain.  Skin: Negative.   Allergic/Immunologic: Negative.   Neurological:  Negative for dizziness and light-headedness.  Hematological:  Negative for adenopathy. Does not bruise/bleed easily.  Psychiatric/Behavioral:  Negative for dysphoric mood and sleep disturbance. The patient is not nervous/anxious.    Vitals:   10/13/21 1521  BP: (!) 176/127  Pulse: 96  Resp: 20  SpO2: 98%  Weight: 212 lb 8 oz (96.4 kg)  Height: 5\' 10"  (1.778 m)   Wt Readings from Last 3 Encounters:  10/13/21 212 lb 8 oz (96.4 kg)  09/01/21 212 lb 8 oz (96.4 kg)  08/04/21 216 lb (98 kg)   Lab Results  Component Value Date   CREATININE 0.57 (L) 03/22/2021   CREATININE 1.05 05/16/2020   CREATININE 0.68 (L) 05/12/2020   Physical Exam Vitals and nursing note reviewed.  Constitutional:      Appearance: Normal appearance.  HENT:     Head: Normocephalic and atraumatic.     Right Ear: Decreased hearing noted.     Left Ear: Decreased hearing noted.  Neck:     Vascular: No JVD.  Cardiovascular:     Rate and Rhythm: Normal rate and regular rhythm.  Pulmonary:     Effort: Pulmonary effort is normal.     Breath sounds: Wheezing present. No rhonchi or rales.  Abdominal:     General: There is no  distension.     Palpations: Abdomen is soft.     Tenderness: There is no abdominal tenderness.  Musculoskeletal:        General: Swelling (right knee) present. No tenderness.     Cervical back: Normal range of motion and neck supple.     Right lower leg: No edema.     Left lower leg: No edema.  Skin:    General: Skin is warm and dry.  Neurological:     General: No focal deficit present.     Mental Status: He is alert and oriented to person, place, and time.  Psychiatric:        Mood and Affect: Mood normal.        Behavior: Behavior normal.    Assessment & Plan:  1.  Chronic heart failure with preserved ejection fraction- - NYHA class II - euvolemic today - not weighing daily but does have working scales; emphasized to weigh daily so that he can call for an overnight weight gain of >2 pounds or a weekly weight gain of > 5 pounds - weight unchanged from last visit here 6 weeks ago - says that he's quite active at work  - not adding salt and is trying to follow a low sodium diet - have scheduled his echo for 11/05/21 when he's off work for the holidays - may need cardiology referral depending on those results - BNP 07/02/2019 was 32.0  2: HTN- - BP elevated (176/127) but he hasn't taken any of his medications today and it's 3:30pm - lengthy discussion had about the risks of uncontrolled HTN (stroke, MI, kidney damage etc) and the importance of taking his medications as prescribed - now using a pill box but he still forgot; may benefit from alert/alarm on his phone to remind him - saw PCP Mathis Dad) 08/04/21 - BMP from 03/22/21 reviewed and showed sodium 140, potassium 4.1, creatinine 0.57 and GFR 119  3: Tobacco use-  - says that he's smoking ~ 1-2 cigars daily  - has 1 shot of liquor and "some beer" daily - complete cessation discussed for 2 minutes with him  4: Right knee pain- - has seen ortho and has MRI pending   Medication bottles reviewed but, again, many of the  labels are faded and worn.   Return in 3 weeks or sooner for any questions/problems before then.

## 2021-10-13 ENCOUNTER — Other Ambulatory Visit: Payer: Self-pay

## 2021-10-13 ENCOUNTER — Ambulatory Visit: Payer: BC Managed Care – PPO | Attending: Family | Admitting: Family

## 2021-10-13 ENCOUNTER — Encounter: Payer: Self-pay | Admitting: Family

## 2021-10-13 VITALS — BP 176/127 | HR 96 | Resp 20 | Ht 70.0 in | Wt 212.5 lb

## 2021-10-13 DIAGNOSIS — M25561 Pain in right knee: Secondary | ICD-10-CM | POA: Diagnosis not present

## 2021-10-13 DIAGNOSIS — I110 Hypertensive heart disease with heart failure: Secondary | ICD-10-CM | POA: Diagnosis not present

## 2021-10-13 DIAGNOSIS — I5032 Chronic diastolic (congestive) heart failure: Secondary | ICD-10-CM

## 2021-10-13 DIAGNOSIS — F17200 Nicotine dependence, unspecified, uncomplicated: Secondary | ICD-10-CM | POA: Diagnosis not present

## 2021-10-13 DIAGNOSIS — E785 Hyperlipidemia, unspecified: Secondary | ICD-10-CM | POA: Diagnosis not present

## 2021-10-13 DIAGNOSIS — I10 Essential (primary) hypertension: Secondary | ICD-10-CM

## 2021-10-13 DIAGNOSIS — F17210 Nicotine dependence, cigarettes, uncomplicated: Secondary | ICD-10-CM | POA: Diagnosis not present

## 2021-10-13 DIAGNOSIS — G8929 Other chronic pain: Secondary | ICD-10-CM

## 2021-10-13 NOTE — Patient Instructions (Addendum)
Resume weighing daily and call for an overnight weight gain of 3 pounds or more or a weekly weight gain of more than 5 pounds.   Call in refills for you amlodipine and lisinopril

## 2021-11-03 ENCOUNTER — Ambulatory Visit: Payer: BC Managed Care – PPO | Admitting: Nurse Practitioner

## 2021-11-03 NOTE — Progress Notes (Deleted)
There were no vitals taken for this visit.   Subjective:    Patient ID: Jose [name]., male    DOB: 10/13/1971, 50 y.o.   MRN: [id number]  HPI: Jose [name]. is a 50 y.o. male  No chief complaint on file.  HYPERTENSION Hypertension status: controlled  Satisfied with current treatment? yes Duration of hypertension: years BP monitoring frequency:  not checking BP range:  BP medication side effects:  no Medication compliance: excellent compliance Previous BP meds: hydralazine, amlodipine, carvedilol, and lisinopril Aspirin: no Recurrent headaches: no Visual changes: no Palpitations: no Dyspnea: no Chest pain: no Lower extremity edema: no Dizzy/lightheaded: no  HEART FAILURE  ALCOHOL USE  Relevant past medical, surgical, family and social history reviewed and updated as indicated. Interim medical history since our last visit reviewed. Allergies and medications reviewed and updated.  Review of Systems  Eyes:  Negative for visual disturbance.  Respiratory:  Negative for shortness of breath.   Cardiovascular:  Negative for chest pain and leg swelling.  Neurological:  Negative for light-headedness and headaches.   Per HPI unless specifically indicated above     Objective:    There were no vitals taken for this visit.  Wt Readings from Last 3 Encounters:  10/13/21 212 lb 8 oz (96.4 kg)  09/01/21 212 lb 8 oz (96.4 kg)  08/04/21 216 lb (98 kg)    Physical Exam Vitals and nursing note reviewed.  Constitutional:      General: He is not in acute distress.    Appearance: Normal appearance. He is not ill-appearing, toxic-appearing or diaphoretic.  HENT:     Head: Normocephalic.     Right Ear: External ear normal.     Left Ear: External ear normal.     Nose: Nose normal. No congestion or rhinorrhea.     Mouth/Throat:     Mouth: Mucous membranes are moist.  Eyes:     General:        Right eye: No discharge.        Left eye: No discharge.     Extraocular  Movements: Extraocular movements intact.     Conjunctiva/sclera: Conjunctivae normal.     Pupils: Pupils are equal, round, and reactive to light.  Cardiovascular:     Rate and Rhythm: Normal rate and regular rhythm.     Heart sounds: No murmur heard. Pulmonary:     Effort: Pulmonary effort is normal. No respiratory distress.     Breath sounds: Normal breath sounds. No wheezing, rhonchi or rales.  Abdominal:     General: Abdomen is flat. Bowel sounds are normal.  Musculoskeletal:     Cervical back: Normal range of motion and neck supple.  Skin:    General: Skin is warm and dry.     Capillary Refill: Capillary refill takes less than 2 seconds.  Neurological:     General: No focal deficit present.     Mental Status: He is alert and oriented to person, place, and time.  Psychiatric:        Mood and Affect: Mood normal.        Behavior: Behavior normal.        Thought Content: Thought content normal.        Judgment: Judgment normal.    Results for orders placed or performed in visit on 03/22/21  Comp Met (CMET)  Result Value Ref Range   Glucose 77 65 - 99 mg/dL   BUN 16 6 - 24 mg/dL  Creatinine, Ser 0.57 (L) 0.76 - 1.27 mg/dL   eGFR 119 >59 mL/min/1.73   BUN/Creatinine Ratio 28 (H) 9 - 20   Sodium 140 134 - 144 mmol/L   Potassium 4.1 3.5 - 5.2 mmol/L   Chloride 100 96 - 106 mmol/L   CO2 23 20 - 29 mmol/L   Calcium 9.4 8.7 - 10.2 mg/dL   Total Protein 7.2 6.0 - 8.5 g/dL   Albumin 4.2 3.8 - 4.9 g/dL   Globulin, Total 3.0 1.5 - 4.5 g/dL   Albumin/Globulin Ratio 1.4 1.2 - 2.2   Bilirubin Total <0.2 0.0 - 1.2 mg/dL   Alkaline Phosphatase 92 44 - 121 IU/L   AST 19 0 - 40 IU/L   ALT 12 0 - 44 IU/L  CBC w/Diff  Result Value Ref Range   WBC 6.2 3.4 - 10.8 x10E3/uL   RBC 4.51 4.14 - 5.80 x10E6/uL   Hemoglobin 13.9 13.0 - 17.7 g/dL   Hematocrit 39.7 37.5 - 51.0 %   MCV 88 79 - 97 fL   MCH 30.8 26.6 - 33.0 pg   MCHC 35.0 31.5 - 35.7 g/dL   RDW 12.8 11.6 - 15.4 %   Platelets  152 150 - 450 x10E3/uL   Neutrophils 53 Not Estab. %   Lymphs 33 Not Estab. %   Monocytes 10 Not Estab. %   Eos 2 Not Estab. %   Basos 1 Not Estab. %   Neutrophils Absolute 3.3 1.4 - 7.0 x10E3/uL   Lymphocytes Absolute 2.0 0.7 - 3.1 x10E3/uL   Monocytes Absolute 0.6 0.1 - 0.9 x10E3/uL   EOS (ABSOLUTE) 0.1 0.0 - 0.4 x10E3/uL   Basophils Absolute 0.1 0.0 - 0.2 x10E3/uL   Immature Granulocytes 1 Not Estab. %   Immature Grans (Abs) 0.0 0.0 - 0.1 x10E3/uL  Lipid Profile  Result Value Ref Range   Cholesterol, Total 220 (H) 100 - 199 mg/dL   Triglycerides 182 (H) 0 - 149 mg/dL   HDL 81 >39 mg/dL   VLDL Cholesterol Cal 31 5 - 40 mg/dL   LDL Chol Calc (NIH) 108 (H) 0 - 99 mg/dL   Chol/HDL Ratio 2.7 0.0 - 5.0 ratio      Assessment & Plan:   Problem List Items Addressed This Visit       Cardiovascular and Mediastinum   Hypertension - Primary   Chronic diastolic congestive heart failure (HCC)     Respiratory   COPD (chronic obstructive pulmonary disease) (HCC)     Other   Seizures (HCC)   Hypercholesterolemia   Iron deficiency anemia due to chronic blood loss   Alcoholic intoxication without complication (HCC)     Follow up plan: No follow-ups on file.   A total of 30 minutes were spent on this encounter today.  When total time is documented, this includes both the face-to-face and non-face-to-face time personally spent before, during and after the visit on the date of the encounter discussing the importance of seeing HF clinic and pelvic pain plan of care.

## 2021-11-04 ENCOUNTER — Emergency Department: Payer: BC Managed Care – PPO

## 2021-11-04 ENCOUNTER — Other Ambulatory Visit: Payer: Self-pay

## 2021-11-04 ENCOUNTER — Encounter: Payer: Self-pay | Admitting: Emergency Medicine

## 2021-11-04 ENCOUNTER — Emergency Department
Admission: EM | Admit: 2021-11-04 | Discharge: 2021-11-04 | Disposition: A | Discharge status: Home / Self Care | Payer: BC Managed Care – PPO | Attending: Emergency Medicine | Admitting: Emergency Medicine

## 2021-11-04 DIAGNOSIS — F17210 Nicotine dependence, cigarettes, uncomplicated: Secondary | ICD-10-CM | POA: Diagnosis not present

## 2021-11-04 DIAGNOSIS — D696 Thrombocytopenia, unspecified: Secondary | ICD-10-CM | POA: Diagnosis not present

## 2021-11-04 DIAGNOSIS — I10 Essential (primary) hypertension: Secondary | ICD-10-CM

## 2021-11-04 DIAGNOSIS — F1010 Alcohol abuse, uncomplicated: Secondary | ICD-10-CM | POA: Insufficient documentation

## 2021-11-04 DIAGNOSIS — Z79899 Other long term (current) drug therapy: Secondary | ICD-10-CM | POA: Insufficient documentation

## 2021-11-04 DIAGNOSIS — R404 Transient alteration of awareness: Secondary | ICD-10-CM | POA: Diagnosis not present

## 2021-11-04 DIAGNOSIS — G40909 Epilepsy, unspecified, not intractable, without status epilepticus: Secondary | ICD-10-CM | POA: Diagnosis not present

## 2021-11-04 DIAGNOSIS — R0902 Hypoxemia: Secondary | ICD-10-CM | POA: Diagnosis not present

## 2021-11-04 DIAGNOSIS — M19012 Primary osteoarthritis, left shoulder: Secondary | ICD-10-CM | POA: Diagnosis not present

## 2021-11-04 DIAGNOSIS — I5032 Chronic diastolic (congestive) heart failure: Secondary | ICD-10-CM | POA: Diagnosis not present

## 2021-11-04 DIAGNOSIS — W19XXXA Unspecified fall, initial encounter: Secondary | ICD-10-CM | POA: Diagnosis not present

## 2021-11-04 DIAGNOSIS — R569 Unspecified convulsions: Secondary | ICD-10-CM | POA: Diagnosis not present

## 2021-11-04 DIAGNOSIS — J449 Chronic obstructive pulmonary disease, unspecified: Secondary | ICD-10-CM | POA: Insufficient documentation

## 2021-11-04 DIAGNOSIS — I110 Hypertensive heart disease with heart failure: Secondary | ICD-10-CM | POA: Diagnosis not present

## 2021-11-04 DIAGNOSIS — S40012A Contusion of left shoulder, initial encounter: Secondary | ICD-10-CM | POA: Insufficient documentation

## 2021-11-04 DIAGNOSIS — J45909 Unspecified asthma, uncomplicated: Secondary | ICD-10-CM | POA: Diagnosis not present

## 2021-11-04 DIAGNOSIS — R0689 Other abnormalities of breathing: Secondary | ICD-10-CM | POA: Diagnosis not present

## 2021-11-04 LAB — COMPREHENSIVE METABOLIC PANEL
ALT: 45 U/L — ABNORMAL HIGH (ref 0–44)
AST: 69 U/L — ABNORMAL HIGH (ref 15–41)
Albumin: 4 g/dL (ref 3.5–5.0)
Alkaline Phosphatase: 77 U/L (ref 38–126)
Anion gap: 12 (ref 5–15)
BUN: 17 mg/dL (ref 6–20)
CO2: 26 mmol/L (ref 22–32)
Calcium: 9.3 mg/dL (ref 8.9–10.3)
Chloride: 96 mmol/L — ABNORMAL LOW (ref 98–111)
Creatinine, Ser: 0.62 mg/dL (ref 0.61–1.24)
GFR, Estimated: >60 mL/min (ref >60)
Glucose, Bld: 142 mg/dL — ABNORMAL HIGH (ref 70–99)
Potassium: 4 mmol/L (ref 3.5–5.1)
Sodium: 134 mmol/L — ABNORMAL LOW (ref 135–145)
Total Bilirubin: 0.8 mg/dL (ref 0.3–1.2)
Total Protein: 7.5 g/dL (ref 6.5–8.1)

## 2021-11-04 LAB — CBC WITH DIFFERENTIAL/PLATELET
Abs Immature Granulocytes: 0.03 10*3/uL (ref 0.00–0.07)
Basophils Absolute: 0.1 10*3/uL (ref 0.0–0.1)
Basophils Relative: 1 %
Eosinophils Absolute: 0 10*3/uL (ref 0.0–0.5)
Eosinophils Relative: 0 %
HCT: 41.3 % (ref 39.0–52.0)
Hemoglobin: 14.1 g/dL (ref 13.0–17.0)
Immature Granulocytes: 0 %
Lymphocytes Relative: 12 %
Lymphs Abs: 0.8 10*3/uL (ref 0.7–4.0)
MCH: 31.3 pg (ref 26.0–34.0)
MCHC: 34.1 g/dL (ref 30.0–36.0)
MCV: 91.6 fL (ref 80.0–100.0)
Monocytes Absolute: 0.7 10*3/uL (ref 0.1–1.0)
Monocytes Relative: 10 %
Neutro Abs: 5.3 10*3/uL (ref 1.7–7.7)
Neutrophils Relative %: 77 %
Platelets: 107 10*3/uL — ABNORMAL LOW (ref 150–400)
RBC: 4.51 MIL/uL (ref 4.22–5.81)
RDW: 14.5 % (ref 11.5–15.5)
WBC: 6.8 10*3/uL (ref 4.0–10.5)
nRBC: 0.3 % — ABNORMAL HIGH (ref 0.0–0.2)

## 2021-11-04 MED ORDER — LORAZEPAM 2 MG/ML IJ SOLN: 1.0000 mg | INTRAMUSCULAR | Status: DC | PRN

## 2021-11-04 MED ORDER — ADULT MULTIVITAMIN W/MINERALS CH
1.0000 | ORAL_TABLET | Freq: Every day | ORAL | Status: DC
  Administered 2021-11-04: 15:00:00 1 via ORAL
  Filled 2021-11-04: qty 1

## 2021-11-04 MED ORDER — THIAMINE HCL 100 MG PO TABS
100.0000 mg | ORAL_TABLET | Freq: Every day | ORAL | Status: DC
  Administered 2021-11-04: 15:00:00 100 mg via ORAL
  Filled 2021-11-04: qty 1

## 2021-11-04 MED ORDER — CARBAMAZEPINE 200 MG PO TABS
200.0000 mg | ORAL_TABLET | ORAL | Status: AC
  Administered 2021-11-04: 15:00:00 200 mg via ORAL
  Filled 2021-11-04: qty 1

## 2021-11-04 MED ORDER — THIAMINE HCL 100 MG/ML IJ SOLN: 100.0000 mg | Freq: Every day | INTRAMUSCULAR | Status: DC

## 2021-11-04 MED ORDER — LORAZEPAM 1 MG PO TABS: 1.0000 mg | ORAL_TABLET | ORAL | Status: DC | PRN

## 2021-11-04 MED ORDER — LORAZEPAM 2 MG/ML IJ SOLN: 1.0000 mg | Freq: Once | INTRAMUSCULAR | Status: DC

## 2021-11-04 MED ORDER — LORAZEPAM 2 MG/ML IJ SOLN
2.0000 mg | Freq: Once | INTRAMUSCULAR | Status: AC
  Administered 2021-11-04: 15:00:00 2 mg via INTRAVENOUS
  Filled 2021-11-04: qty 1

## 2021-11-04 MED ORDER — FOLIC ACID 1 MG PO TABS
1.0000 mg | ORAL_TABLET | Freq: Every day | ORAL | Status: DC
  Administered 2021-11-04: 15:00:00 1 mg via ORAL
  Filled 2021-11-04: qty 1

## 2021-11-04 NOTE — ED Triage Notes (Signed)
Pt is coming from friend's home via [facility]. Per EMS the friend states that the pt got up to go to the restroom and heard a thud sound and saw pt seizing in restroom. When EMS got there pt was post-ictal for 15 minutes and was A&Ox2.   EMS got a hold of pt's mother (whom he lives with.) Pt does have a hx of epilepsy.   Pt also states he drinks 1/2 pint of liquor and beer daily and last had something to drink was yesterday.

## 2021-11-04 NOTE — ED Provider Notes (Signed)
----------------------------------------- °  3:02 PM on 11/04/2021 -----------------------------------------  Blood pressure (!) 186/118, pulse (!) 105, temperature 99 F (37.2 C), temperature source Oral, resp. rate (!) 21, height 5\' 10"  (1.778 m), weight 96.6 kg, SpO2 95 %.  Assuming care from Dr. Tamala Julian.  In short, Jose [name]. is a 50 y.o. male with a chief complaint of Seizures .  Refer to the original H&P for additional details.  The current plan of care is to follow-up CT head and observe for recurrent seizure, if remains stable would appropriate for discharge home.  ----------------------------------------- 5:14 PM on 11/04/2021 ----------------------------------------- CT head is negative for acute process, on reassessment patient is awake and alert, states he feels tired but states this is normal for him after a seizure.  He does admit that he has been off his seizure medication for the past 3 days due to traveling from out of town, does now have his medication here locally.  He continues to have signs of alcohol withdrawal, was given 2 mg of IV Ativan with partial improvement.  He was offered admission for further management of seizures in the setting of alcohol withdrawal, but declines.  He states he is not interested in quitting drinking and prefers to be discharged home.  He was counseled to gradually cut back on alcohol if he does desire to quit and to return to the ED for any recurrent seizures or other worsening symptoms.  Patient provided with referral to neurology, patient agrees with plan.    Blake Divine, MD 11/04/21 [phone]

## 2021-11-04 NOTE — ED Notes (Signed)
Patient transported to X-ray 

## 2021-11-04 NOTE — ED Provider Notes (Signed)
[facility] Emergency Department Provider Note  ____________________________________________   Event Date/Time   First MD Initiated Contact with Patient 11/04/21 1402     (approximate)  I have reviewed the triage vital signs and the nursing notes.   HISTORY  Chief Complaint Seizures   HPI Jose [name]. is a 50 y.o. male with below noted past medical history including alcohol abuse drinking about 1/2 pint of liquor per day most recently yesterday who presents for assessment after witnessed seizure that occurred earlier today.  Patient was reportedly found by family who heard a thud from the neighboring room and found patient seizing.  Is unclear how long this lasted but on EMS arrival patient appeared slightly postictal.  Patient states she does not remember exactly what happened next he may have fallen he is primarily sore around his left shoulder.  He states he thinks he has seizure yesterday as well.  States he has been out of seizure medicines and other chronic medications for couple days because he is visiting family.  He denies any recent chest pain, cough, fevers, chills, vomiting, diarrhea, dysuria, rash or other recent injuries.  He denies other illicit drug use and states he only had 1 beer today.  No other acute concerns at this time.         Past Medical History:  Diagnosis Date   Alcohol abuse    Cervicalgia    CHF (congestive heart failure) (HCC)    Hyperlipidemia    Hypertension    Seizures ([place])     Patient Active Problem List   Diagnosis Date Noted   Alcoholic intoxication without complication ([place]) 81/27/5170   Chronic diastolic congestive heart failure ([place]) 09/08/2020   Iron deficiency anemia due to chronic blood loss    Family history of colon cancer 06/28/2016   Seizures ([place]) 04/30/2015   Hypertension 04/30/2015   Chronic alcohol abuse 04/30/2015   Epilepsy ([place]) 04/30/2015   Tobacco dependence 04/30/2015   COPD (chronic  obstructive pulmonary disease) ([place]) 01/74/9449   Uncomplicated asthma 67/59/1638   Lumbago 04/30/2015   Hypercholesterolemia 04/30/2015   Chronic pain 04/30/2015   Neck pain 04/22/2013    Past Surgical History:  Procedure Laterality Date   COLONOSCOPY WITH PROPOFOL N/A 01/16/2018   Procedure: COLONOSCOPY WITH PROPOFOL;  Surgeon: Lin Landsman, MD;  Location: [facility] ENDOSCOPY;  Service: Gastroenterology;  Laterality: N/A;   ESOPHAGOGASTRODUODENOSCOPY (EGD) WITH PROPOFOL N/A 01/16/2018   Procedure: ESOPHAGOGASTRODUODENOSCOPY (EGD) WITH PROPOFOL;  Surgeon: Lin Landsman, MD;  Location: [facility] ENDOSCOPY;  Service: Gastroenterology;  Laterality: N/A;    Prior to Admission medications   Medication Sig Start Date End Date Taking? Authorizing Provider  albuterol (VENTOLIN HFA) 108 (90 Base) MCG/ACT inhaler Inhale 2 puffs into the lungs every 6 (six) hours as needed for wheezing or shortness of breath. 02/18/21   Jon Billings, NP  amLODipine (NORVASC) 10 MG tablet Take 1 tablet (10 mg total) by mouth daily. 08/04/21   Jon Billings, NP  carbamazepine (TEGRETOL) 200 MG tablet Take 1 tablet (200 mg total) by mouth 2 (two) times daily. 08/04/21   Jon Billings, NP  carvedilol (COREG) 25 MG tablet Take 1 tablet (25 mg total) by mouth 2 (two) times daily with a meal. 08/04/21   Jon Billings, NP  furosemide (LASIX) 20 MG tablet Take 1 tablet (20 mg total) by mouth daily. 08/04/21   Jon Billings, NP  hydrALAZINE (APRESOLINE) 50 MG tablet Take 1 tablet (50 mg total) by mouth  3 (three) times daily. 09/01/21   Alisa Graff, FNP  isosorbide mononitrate (IMDUR) 30 MG 24 hr tablet Take 1 tablet (30 mg total) by mouth daily. 08/04/21   Jon Billings, NP  lisinopril (ZESTRIL) 40 MG tablet Take 1 tablet (40 mg total) by mouth daily. 08/04/21 11/02/21  Jon Billings, NP  potassium chloride SA (KLOR-CON) 20 MEQ tablet Take 1 tablet (20 mEq total) by mouth daily. 08/04/21   Jon Billings, NP  rosuvastatin (CRESTOR) 20 MG tablet Take 1 tablet (20 mg total) by mouth daily. 08/04/21   Jon Billings, NP    Allergies Patient has no known allergies.  Family History  Problem Relation Age of Onset   Diabetes Mother    Colon cancer Mother    Hypertension Father    Multiple sclerosis Father     Social History Social History   Tobacco Use   Smoking status: Every Day    Packs/day: 0.00    Types: Cigars, Cigarettes   Smokeless tobacco: Former   Tobacco comments:    2 cigars a day  Vaping Use   Vaping Use: Never used  Substance Use Topics   Alcohol use: Yes    Alcohol/week: 28.0 standard drinks    Types: 7 Cans of beer, 21 Shots of liquor per week    Comment: half pint liquor per day   Drug use: No    Types: Marijuana    Comment: quit back in the late 90's    Review of Systems  Review of Systems  Constitutional:  Negative for chills and fever.  HENT:  Negative for sore throat.   Eyes:  Negative for pain.  Respiratory:  Negative for cough and stridor.   Cardiovascular:  Negative for chest pain.  Gastrointestinal:  Negative for vomiting.  Genitourinary:  Negative for dysuria.  Musculoskeletal:  Positive for joint pain (L shoulder).  Skin:  Negative for rash.  Neurological:  Positive for seizures and loss of consciousness. Negative for headaches.  Psychiatric/Behavioral:  Positive for memory loss and substance abuse (1/2 pint of liquor/day). Negative for suicidal ideas.   All other systems reviewed and are negative.    ____________________________________________   PHYSICAL EXAM:  VITAL SIGNS: ED Triage Vitals  Enc Vitals Group     BP      Pulse      Resp      Temp      Temp src      SpO2      Weight      Height      Head Circumference      Peak Flow      Pain Score      Pain Loc      Pain Edu?      Excl. in [place]?    Vitals:   11/04/21 1500 11/04/21 1503  BP: (!) 184/111 (!) 184/11  Pulse: 98 (!) 105  Resp: (!) 27   Temp:     SpO2: 94%    Physical Exam Vitals and nursing note reviewed.  Constitutional:      General: He is not in acute distress.    Appearance: He is well-developed.  HENT:     Head: Normocephalic and atraumatic.     Right Ear: External ear normal.     Left Ear: External ear normal.     Nose: Nose normal.  Eyes:     Conjunctiva/sclera: Conjunctivae normal.  Cardiovascular:     Rate and Rhythm: Normal rate and regular rhythm.  Heart sounds: No murmur heard. Pulmonary:     Effort: Pulmonary effort is normal. No respiratory distress.     Breath sounds: Normal breath sounds.  Abdominal:     Palpations: Abdomen is soft.     Tenderness: There is no abdominal tenderness.  Musculoskeletal:        General: No swelling.     Cervical back: Neck supple.  Skin:    General: Skin is warm and dry.     Capillary Refill: Capillary refill takes less than 2 seconds.  Neurological:     Mental Status: He is alert and oriented to person, place, and time.  Psychiatric:        Mood and Affect: Mood normal.    Patient has full symmetric strength throughout the bilateral upper and lower extremities.  There are some tenderness surrounding the left shoulder but there is no obvious deformity effusion or overlying skin changes.  2+ radial pulses.  There is no tenderness over the C/T/L-spine.  Cranial nerves II through XII are grossly intact. ____________________________________________   LABS (all labs ordered are listed, but only abnormal results are displayed)  Labs Reviewed  CBC WITH DIFFERENTIAL/PLATELET - Abnormal; Notable for the following components:      Result Value   Platelets 107 (*)    nRBC 0.3 (*)    All other components within normal limits  COMPREHENSIVE METABOLIC PANEL - Abnormal; Notable for the following components:   Sodium 134 (*)    Chloride 96 (*)    Glucose, Bld 142 (*)    AST 69 (*)    ALT 45 (*)    All other components within normal limits  CBG MONITORING, ED    ____________________________________________  EKG  ECG shows sinus tachycardia with a ventricular rate of 106, normal axis, left ventricular hypertrophy and evidence of a prior MI with Q waves in V2 and V3 without other evidence of acute ischemia or significant arrhythmia  ____________________________________________  RADIOLOGY  ED MD interpretation: Plain film of the left shoulder shows some degenerative changes without evidence of acute fracture or dislocation.  Official radiology report(s): DG Shoulder Left  Result Date: 11/04/2021 CLINICAL DATA:  Seizure.  Fall.  Left shoulder pain. EXAM: LEFT SHOULDER - 2+ VIEW COMPARISON:  One-view chest x-ray 05/16/2020 FINDINGS: The left shoulder is located. Advanced degenerative changes are noted at the [facility] joint and coracoclavicular ligament suggesting previous trauma. No acute trauma is present. Visualized hemithorax is clear. IMPRESSION: 1. No acute abnormality. 2. Advanced degenerative changes of the left AC joint and coracoclavicular ligament suggesting previous trauma. Electronically Signed   By: San Morelle M.D.   On: 11/04/2021 15:06    ____________________________________________   PROCEDURES  Procedure(s) performed (including Critical Care):  .1-3 Lead EKG Interpretation Performed by: Lucrezia Starch, MD Authorized by: Lucrezia Starch, MD     Interpretation: normal     ECG rate assessment: normal     Rhythm: sinus rhythm     Ectopy: none     Conduction: normal     ____________________________________________   INITIAL IMPRESSION / ASSESSMENT AND PLAN / ED COURSE      patient presents with above-stated history exam after witnessed seizure today.  It seems he had a seizure yesterday as well and thinking about another seizure in the last week.  On arrival, patient is slightly hypertensive and tachypneic with otherwise stable vital signs on room air.  He is awake and alert with a nonfocal neurological exam  elicits mild tenderness  on the left shoulder.  I suspect breakthrough seizure in the setting of some mild alcohol withdrawal not taking his seizure medicines.  Given degree of alcohol abuse and additional seizures over the last week will obtain a CT head to assess for possible occult injury or hemorrhage.  He is neurovascular tact throughout the left shoulder and plain film shows no acute fracture dislocation.  I suspect likely contusion.  CBC shows no leukocytosis or acute anemia.  Patient is thrombocytopenic with platelets of 107 compared to 157 months ago.  CMP shows mild transaminitis which is factors related to alcohol use following up significant electrolyte or metabolic derangements.  EKG not suggestive of significant arrhythmia or cardiac ischemia as etiology of the patient's presentation today.  Care patient signed over to assuming provider approximately 3 PM.  Plan is to follow-up CT head and reassess.  Patient will likely be able to safely discharged home.  We will give a dose of his home Tegretol here.  Discussed importance of adherence to this medication and dangers of alcohol abuse and recommendation for close outpatient PCP follow-up for more resources to safely reduce his alcohol intake.  Patient is amenable this plan.   Patient placed on CIWA protocol pending CT head.   ____________________________________________   FINAL CLINICAL IMPRESSION(S) / ED DIAGNOSES  Final diagnoses:  Seizure ([place])  Alcohol abuse  Thrombocytopenia (HCC)  Contusion of left shoulder, initial encounter  Hypertension, unspecified type    Medications  LORazepam (ATIVAN) tablet 1-4 mg (has no administration in time range)    Or  LORazepam (ATIVAN) injection 1-4 mg (has no administration in time range)  thiamine tablet 100 mg (100 mg Oral Given 11/04/21 1526)    Or  thiamine (B-1) injection 100 mg ( Intravenous See Alternative 24/23/53 6144)  folic acid (FOLVITE) tablet 1 mg (1 mg Oral Given  11/04/21 1526)  multivitamin with minerals tablet 1 tablet (1 tablet Oral Given 11/04/21 1526)  carbamazepine (TEGRETOL) tablet 200 mg (200 mg Oral Given 11/04/21 1526)  LORazepam (ATIVAN) injection 2 mg (2 mg Intravenous Given 11/04/21 1528)     ED Discharge Orders     None        Note:  This document was prepared using Dragon voice recognition software and may include unintentional dictation errors.    Lucrezia Starch, MD 11/04/21 1537

## 2021-11-05 ENCOUNTER — Encounter: Payer: Self-pay | Admitting: Family

## 2021-11-05 ENCOUNTER — Ambulatory Visit
Admission: RE | Admit: 2021-11-05 | Discharge: 2021-11-05 | Disposition: A | Discharge status: Home / Self Care | Payer: BC Managed Care – PPO | Source: Ambulatory Visit | Attending: Family | Admitting: Family

## 2021-11-05 ENCOUNTER — Ambulatory Visit (HOSPITAL_BASED_OUTPATIENT_CLINIC_OR_DEPARTMENT_OTHER): Payer: BC Managed Care – PPO | Admitting: Family

## 2021-11-05 VITALS — BP 176/116 | HR 114 | Resp 20 | Ht 70.0 in | Wt 208.1 lb

## 2021-11-05 DIAGNOSIS — I5032 Chronic diastolic (congestive) heart failure: Secondary | ICD-10-CM | POA: Diagnosis not present

## 2021-11-05 DIAGNOSIS — E785 Hyperlipidemia, unspecified: Secondary | ICD-10-CM | POA: Diagnosis not present

## 2021-11-05 DIAGNOSIS — R569 Unspecified convulsions: Secondary | ICD-10-CM

## 2021-11-05 DIAGNOSIS — I110 Hypertensive heart disease with heart failure: Secondary | ICD-10-CM | POA: Diagnosis not present

## 2021-11-05 DIAGNOSIS — F17200 Nicotine dependence, unspecified, uncomplicated: Secondary | ICD-10-CM | POA: Diagnosis not present

## 2021-11-05 DIAGNOSIS — I10 Essential (primary) hypertension: Secondary | ICD-10-CM

## 2021-11-05 DIAGNOSIS — F1010 Alcohol abuse, uncomplicated: Secondary | ICD-10-CM | POA: Diagnosis not present

## 2021-11-05 DIAGNOSIS — I509 Heart failure, unspecified: Secondary | ICD-10-CM | POA: Diagnosis not present

## 2021-11-05 LAB — ECHOCARDIOGRAM COMPLETE
AR max vel: 3.22 cm2
AV Area VTI: 3.93 cm2
AV Area mean vel: 2.84 cm2
AV Mean grad: 3 mmHg
AV Peak grad: 5 mmHg
Ao pk vel: 1.12 m/s
Area-P 1/2: 7.9 cm2
Height: 70 in
MV VTI: 3.37 cm2
S' Lateral: 2.4 cm
Weight: 3330 oz

## 2021-11-05 NOTE — Progress Notes (Signed)
*  PRELIMINARY RESULTS* Echocardiogram 2D Echocardiogram has been performed.  Jose [name] 11/05/2021, 10:10 AM

## 2021-11-05 NOTE — Patient Instructions (Addendum)
Continue weighing daily and call for an overnight weight gain of 3 pounds or more or a weekly weight gain of more than 5 pounds.    Call your primary care doctor and neurologist to schedule follow-up appointments.    Take your medication when you get home and make sure you have your seizure medication.

## 2021-11-05 NOTE — Progress Notes (Signed)
Patient ID: Jose Dakins., male    DOB: [birth year], 50 y.o.   MRN: [id number]  Jose [name] is a 50 y/o male with a history of hyperlipidemia, HTN, current tobacco/ alcohol use and chronic heart failure.   Echo report from 01/29/2019 reviewed and showed an EF of 60-65%,  Was in the ED 11/04/21 due to seizures due to alcohol use and being out of seizure medication for a couple of days while visiting family. Head CT negative for acute process. Given IV ativan for signs of alcohol withdrawal and admission offered. Patient declined admission and says that he's not interested in stopping drinking. Released with neurology referral.   He presents today for a follow-up visit with a chief complaint of moderate fatigue since his seizure yesterday. He says that he has associated shortness of breath, light-headedness & tremors along with this. He denies difficulty sleeping, abdominal distention, palpitations, pedal edema, chest pain, cough or weight gain.   Has not taken any of his medications yet today and doesn't have his seizure medication with him and seems unclear if he has any at home. Is very vague when asked about his alcohol use. Says that it's "not much" and when he went to visit family, he was going "house to house". Says that he's looking forward to this weekend as his bday is 11/08/[birth year].   Past Medical History:  Diagnosis Date   Alcohol abuse    Cervicalgia    CHF (congestive heart failure) (HCC)    Hyperlipidemia    Hypertension    Seizures ([place])    Past Surgical History:  Procedure Laterality Date   COLONOSCOPY WITH PROPOFOL N/A 01/16/2018   Procedure: COLONOSCOPY WITH PROPOFOL;  Surgeon: Lin Landsman, MD;  Location: [facility] ENDOSCOPY;  Service: Gastroenterology;  Laterality: N/A;   ESOPHAGOGASTRODUODENOSCOPY (EGD) WITH PROPOFOL N/A 01/16/2018   Procedure: ESOPHAGOGASTRODUODENOSCOPY (EGD) WITH PROPOFOL;  Surgeon: Lin Landsman, MD;  Location: [facility] ENDOSCOPY;  Service: Gastroenterology;   Laterality: N/A;   Family History  Problem Relation Age of Onset   Diabetes Mother    Colon cancer Mother    Hypertension Father    Multiple sclerosis Father    Social History   Tobacco Use   Smoking status: Every Day    Packs/day: 0.00    Types: Cigars, Cigarettes   Smokeless tobacco: Former   Tobacco comments:    2 cigars a day  Substance Use Topics   Alcohol use: Yes    Alcohol/week: 28.0 standard drinks    Types: 7 Cans of beer, 21 Shots of liquor per week    Comment: half pint liquor per day   No Known Allergies  Prior to Admission medications   Medication Sig Start Date End Date Taking? Authorizing Provider  albuterol (VENTOLIN HFA) 108 (90 Base) MCG/ACT inhaler Inhale 2 puffs into the lungs every 6 (six) hours as needed for wheezing or shortness of breath. 02/18/21  Yes Jon Billings, NP  amLODipine (NORVASC) 10 MG tablet Take 1 tablet (10 mg total) by mouth daily. 08/04/21  Yes Jon Billings, NP  carvedilol (COREG) 25 MG tablet Take 1 tablet (25 mg total) by mouth 2 (two) times daily with a meal. 08/04/21  Yes Jon Billings, NP  celecoxib (CELEBREX) 200 MG capsule Take 200 mg by mouth 2 (two) times daily.   Yes [provider]  furosemide (LASIX) 20 MG tablet Take 1 tablet (20 mg total) by mouth daily. 08/04/21  Yes Jon Billings, NP  hydrALAZINE (APRESOLINE) 50  MG tablet Take 1 tablet (50 mg total) by mouth 3 (three) times daily. 09/01/21  Yes Darylene Price A, FNP  isosorbide mononitrate (IMDUR) 30 MG 24 hr tablet Take 1 tablet (30 mg total) by mouth daily. 08/04/21  Yes Jon Billings, NP  lisinopril (ZESTRIL) 40 MG tablet Take 1 tablet (40 mg total) by mouth daily. 08/04/21  Yes Jon Billings, NP  potassium chloride SA (KLOR-CON) 20 MEQ tablet Take 1 tablet (20 mEq total) by mouth daily. 08/04/21  Yes Jon Billings, NP  rosuvastatin (CRESTOR) 20 MG tablet Take 1 tablet (20 mg total) by mouth daily. 08/04/21  Yes Jon Billings, NP   carbamazepine (TEGRETOL) 200 MG tablet Take 1 tablet (200 mg total) by mouth 2 (two) times daily. Patient not taking: Reported on 11/05/2021 08/04/21   Jon Billings, NP    Review of Systems  Constitutional:  Positive for fatigue (worse since seizure yesterday). Negative for appetite change.  HENT:  Positive for hearing loss. Negative for congestion and rhinorrhea.   Eyes: Negative.   Respiratory:  Positive for shortness of breath (minimal). Negative for cough and chest tightness.   Cardiovascular:  Negative for chest pain, palpitations and leg swelling.  Gastrointestinal:  Negative for abdominal distention and abdominal pain.  Endocrine: Negative.   Genitourinary: Negative.   Musculoskeletal:  Positive for arthralgias (right knee). Negative for back pain.  Skin: Negative.   Allergic/Immunologic: Negative.   Neurological:  Positive for tremors and light-headedness (yesterday). Negative for dizziness.  Hematological:  Negative for adenopathy. Does not bruise/bleed easily.  Psychiatric/Behavioral:  Negative for dysphoric mood and sleep disturbance. The patient is not nervous/anxious.    Vitals:   11/05/21 0901  BP: (!) 176/116  Pulse: (!) 114  Resp: 20  SpO2: 98%  Weight: 208 lb 2 oz (94.4 kg)  Height: 5\' 10"  (1.778 m)   Wt Readings from Last 3 Encounters:  11/05/21 208 lb 2 oz (94.4 kg)  11/04/21 213 lb (96.6 kg)  10/13/21 212 lb 8 oz (96.4 kg)   Lab Results  Component Value Date   CREATININE 0.62 11/04/2021   CREATININE 0.57 (L) 03/22/2021   CREATININE 1.05 05/16/2020   Physical Exam Vitals and nursing note reviewed.  Constitutional:      Appearance: Normal appearance.  HENT:     Head: Normocephalic and atraumatic.     Right Ear: Decreased hearing noted.     Left Ear: Decreased hearing noted.  Neck:     Vascular: No JVD.  Cardiovascular:     Rate and Rhythm: Regular rhythm. Tachycardia present.  Pulmonary:     Effort: Pulmonary effort is normal.     Breath  sounds: No wheezing, rhonchi or rales.  Abdominal:     General: There is no distension.     Palpations: Abdomen is soft.     Tenderness: There is no abdominal tenderness.  Musculoskeletal:        General: Swelling (right knee) present. No tenderness.     Cervical back: Normal range of motion and neck supple.     Right lower leg: No edema.     Left lower leg: No edema.  Skin:    General: Skin is warm and dry.  Neurological:     General: No focal deficit present.     Mental Status: He is alert and oriented to person, place, and time.     Motor: Tremor (fine tremors in hands) present.  Psychiatric:        Mood and Affect: Mood  normal.        Behavior: Behavior normal.    Assessment & Plan:  1. Chronic heart failure with preserved ejection fraction- - NYHA class II - euvolemic today - not weighing daily but does have working scales; emphasized to weigh daily so that he can call for an overnight weight gain of >2 pounds or a weekly weight gain of > 5 pounds - weight down 4 pounds from last visit here 3 weeks ago - not adding salt and is trying to follow a low sodium diet - has echo later today - may need cardiology referral depending on those results - BNP 07/02/2019 was 32.0  2: HTN- - BP elevated (176/116) but he hasn't taken any of his medications today but does have them; unclear if he has been out of them while out of town visiting family - he will take them once he gets home after his echo - saw PCP Jose [name]) 08/04/21; wrote on AVS that he needed to call to schedule f/u appt - BMP from 11/04/21 reviewed and showed sodium 134, potassium 4.0, creatinine 0.62 and GFR >60  3: Tobacco use-  - says that he's smoking ~ 1-2 cigars daily  - complete cessation discussed for 2 minutes with him  4: Chronic alcohol use- - has 1 shot of liquor and "some beer" daily although is very vague when asking about specific amount - says it's "not much" and during his recent visit with family  he went "house to house" - mentions looking forward to this weekend to celebrate NYE and his birthday - fine hand tremors noted; emphasized that he really didn't need to be drinking any alcohol but he's not interested in stopping  5: Seizure- - was out of tegretol for a "few days" while visiting family although then says it was only a day he was without - has medications with him but tegretol is not in there and he's unsure if it's at home; emphasized that he look for it when he gets home and if he doesn't have it to call his PCP to get it refilled - wrote on his AVS for him to call neurology to get f/u appointment scheduled as he didn't realize that he needed to do that   Medication bottles reviewed  Return in 1 month or sooner for any questions/problems before then

## 2021-11-09 ENCOUNTER — Telehealth: Payer: Self-pay

## 2021-11-09 NOTE — Telephone Encounter (Addendum)
Left message for patient on home phone to review below results.  Unable to reach/full voicemail box on mobile number. Georg Ruddle, RN ----- Message from Alisa Graff, [place] sent at 11/08/2021  2:52 PM EST ----- Heart is squeezing great! Left lower part of the heart if moderately enlarged which is worse from 2 years ago.  Must not miss taking of your medications as your BP has to get under control. Will discuss medication changes at next visit on 11/30/21.

## 2021-11-10 NOTE — Telephone Encounter (Addendum)
Left voicemail for patient to attempt to reach him with the below echo results. Reminded patient of upcoming appointment on 11/30/21 and call back number.  Georg Ruddle, RN ----- Message from Alisa Graff, [place] sent at 11/08/2021  2:52 PM EST ----- Heart is squeezing great! Left lower part of the heart if moderately enlarged which is worse from 2 years ago.  Must not miss taking of your medications as your BP has to get under control. Will discuss medication changes at next visit on 11/30/21.

## 2021-11-29 NOTE — Progress Notes (Signed)
Patient ID: Jose Dakins., male    DOB: 1971/02/23, 51 y.o.   MRN: [id number]  Jose [name] is a 51 y/o male with a history of hyperlipidemia, HTN, current tobacco/ alcohol use and chronic heart failure.   Echo report from 11/05/21 reviewed and showed an EF of 60-65% along with moderate LVH. Echo report from 01/29/2019 reviewed and showed an EF of 60-65%  Was in the ED earlier today due to fatigue, weakness, chills and body aches. Workup unremarkable and he was released. Was in the ED 11/04/21 due to seizures due to alcohol use and being out of seizure medication for a couple of days while visiting family. Head CT negative for acute process. Given IV ativan for signs of alcohol withdrawal and admission offered. Patient declined admission and says that he's not interested in stopping drinking. Released with neurology referral.   He presents today for a follow-up visit with a chief complaint of moderate shortness of breath with minimal exertion. He describes this as chronic in nature having been present for several years although has worsened over the last several day. He has associated fatigue, cough, palpitations (intermittent), light-headedness, tremors (worse today) and slight weight gain along with this. He denies any difficulty sleeping, abdominal distention, pedal edema or chest pain.  Says that he took his medications earlier today but then says that he's "out of most of them". Says that he fills a pill box but then says, he needs "all his prescriptions" and he can't clarify if he's actually been out of them or for how long. When asked again, he says that he's been out of "everything for awhile". Says that he continues to smoke 2 cigars daily and drinks 1/2-1 pint of liquor daily along with some beer.   Was given RX from the ED for zofran and pepcid and he's unsure of why he was given these. Denies vomiting, heartburn, indigestion or reflux. Said that he had some nausea earlier today but it since  resolved.   Past Medical History:  Diagnosis Date   Alcohol abuse    Cervicalgia    CHF (congestive heart failure) (HCC)    Hyperlipidemia    Hypertension    Seizures (Early)    Past Surgical History:  Procedure Laterality Date   COLONOSCOPY WITH PROPOFOL N/A 01/16/2018   Procedure: COLONOSCOPY WITH PROPOFOL;  Surgeon: Lin Landsman, MD;  Location: [facility] ENDOSCOPY;  Service: Gastroenterology;  Laterality: N/A;   ESOPHAGOGASTRODUODENOSCOPY (EGD) WITH PROPOFOL N/A 01/16/2018   Procedure: ESOPHAGOGASTRODUODENOSCOPY (EGD) WITH PROPOFOL;  Surgeon: Lin Landsman, MD;  Location: [facility] ENDOSCOPY;  Service: Gastroenterology;  Laterality: N/A;   Family History  Problem Relation Age of Onset   Diabetes Mother    Colon cancer Mother    Hypertension Father    Multiple sclerosis Father    Social History   Tobacco Use   Smoking status: Every Day    Packs/day: 0.00    Types: Cigars, Cigarettes   Smokeless tobacco: Former   Tobacco comments:    2 cigars a day  Substance Use Topics   Alcohol use: Yes    Alcohol/week: 28.0 standard drinks    Types: 7 Cans of beer, 21 Shots of liquor per week    Comment: half pint liquor per day   No Known Allergies  Prior to Admission medications   Medication Sig Start Date End Date Taking? Authorizing Provider  albuterol (VENTOLIN HFA) 108 (90 Base) MCG/ACT inhaler Inhale 2 puffs into the lungs every 6 (  six) hours as needed for wheezing or shortness of breath. 02/18/21   Jon Billings, NP  amLODipine (NORVASC) 10 MG tablet Take 1 tablet (10 mg total) by mouth daily. Patient not taking: Reported on 11/30/2021 08/04/21   Jon Billings, NP  carbamazepine (TEGRETOL) 200 MG tablet Take 1 tablet (200 mg total) by mouth 2 (two) times daily. Patient not taking: Reported on 11/05/2021 08/04/21   Jon Billings, NP  carvedilol (COREG) 25 MG tablet Take 1 tablet (25 mg total) by mouth 2 (two) times daily with a meal. Patient not taking: Reported on  11/30/2021 08/04/21   Jon Billings, NP  celecoxib (CELEBREX) 200 MG capsule Take 200 mg by mouth 2 (two) times daily.    [provider]  famotidine (PEPCID) 20 MG tablet Take 1 tablet (20 mg total) by mouth 2 (two) times daily. 11/30/21   Carrie Mew, MD  furosemide (LASIX) 20 MG tablet Take 1 tablet (20 mg total) by mouth daily. Patient not taking: Reported on 11/30/2021 08/04/21   Jon Billings, NP  hydrALAZINE (APRESOLINE) 50 MG tablet Take 1 tablet (50 mg total) by mouth 3 (three) times daily. Patient not taking: Reported on 11/30/2021 09/01/21   Darylene Price A, FNP  isosorbide mononitrate (IMDUR) 30 MG 24 hr tablet Take 1 tablet (30 mg total) by mouth daily. 08/04/21   Jon Billings, NP  lisinopril (ZESTRIL) 40 MG tablet Take 1 tablet (40 mg total) by mouth daily. Patient not taking: Reported on 11/30/2021 08/04/21   Jon Billings, NP  ondansetron (ZOFRAN-ODT) 4 MG disintegrating tablet Take 1 tablet (4 mg total) by mouth every 8 (eight) hours as needed for nausea or vomiting. 11/30/21   Carrie Mew, MD  potassium chloride SA (KLOR-CON) 20 MEQ tablet Take 1 tablet (20 mEq total) by mouth daily. 08/04/21   Jon Billings, NP  rosuvastatin (CRESTOR) 20 MG tablet Take 1 tablet (20 mg total) by mouth daily. 08/04/21   Jon Billings, NP   Review of Systems  Constitutional:  Positive for fatigue (worse since seizure yesterday). Negative for appetite change.  HENT:  Positive for hearing loss. Negative for congestion and rhinorrhea.   Eyes: Negative.   Respiratory:  Positive for cough (very little) and shortness of breath (easily). Negative for chest tightness.   Cardiovascular:  Positive for palpitations. Negative for chest pain and leg swelling.  Gastrointestinal:  Negative for abdominal distention and abdominal pain.  Endocrine: Negative.   Genitourinary: Negative.   Musculoskeletal:  Positive for arthralgias (right knee). Negative for back pain.  Skin:  Negative.   Allergic/Immunologic: Negative.   Neurological:  Positive for tremors (worse today) and light-headedness (little bit). Negative for dizziness.  Hematological:  Negative for adenopathy. Does not bruise/bleed easily.  Psychiatric/Behavioral:  Negative for dysphoric mood and sleep disturbance. The patient is not nervous/anxious.    Vitals:   11/30/21 1149  BP: (!) 173/99  Pulse: 85  Resp: 18  SpO2: 96%  Weight: 216 lb (98 kg)  Height: 5\' 10"  (1.778 m)   Wt Readings from Last 3 Encounters:  11/30/21 216 lb (98 kg)  11/30/21 221 lb (100.2 kg)  11/05/21 208 lb 2 oz (94.4 kg)   Lab Results  Component Value Date   CREATININE 0.76 11/30/2021   CREATININE 0.62 11/04/2021   CREATININE 0.57 (L) 03/22/2021    Physical Exam Vitals and nursing note reviewed.  Constitutional:      Appearance: Normal appearance.  HENT:     Head: Normocephalic and atraumatic.  Right Ear: Decreased hearing noted.     Left Ear: Decreased hearing noted.  Eyes:     Comments: Eyelids puffy  Neck:     Vascular: No JVD.  Cardiovascular:     Rate and Rhythm: Normal rate and regular rhythm.  Pulmonary:     Effort: Pulmonary effort is normal.     Breath sounds: No wheezing, rhonchi or rales.  Abdominal:     General: There is distension.     Palpations: Abdomen is soft.     Tenderness: There is no abdominal tenderness.  Musculoskeletal:        General: Swelling (right knee) present. No tenderness.     Cervical back: Normal range of motion and neck supple.     Right lower leg: No edema.     Left lower leg: No edema.  Skin:    General: Skin is warm and dry.  Neurological:     General: No focal deficit present.     Mental Status: He is alert and oriented to person, place, and time.     Motor: Tremor (fine tremors in hands/ lower arms) present.  Psychiatric:        Mood and Affect: Mood normal.        Behavior: Behavior normal.    Assessment & Plan:  1. Chronic heart failure with  preserved ejection fraction with structural changes (LVH)- - NYHA class III - mildly fluid overloaded today with weight gain, swelling and shortness of breath (has been out of medications, unknown which ones, for an unknown amount of time - not weighing daily but does have working scales; emphasized to weigh daily so that he can call for an overnight weight gain of >2 pounds or a weekly weight gain of > 5 pounds - weight up 8 pounds from last visit here 3 weeks ago - not adding salt and is trying to follow a low sodium diet - recent echo showed moderate LVH; discuss med changes after he gets back on everything consistently - mentioned IV lasix but he wants to go home and get some rest; will call him back later today and readdress this - BNP 05/16/20 was 50.9  2: HTN- - BP elevated but, again, he's been out of unknown number of medications for unknown period of time - saw PCP Mathis Dad) 08/04/21; returns 12/07/21 - BMP from 11/30/21 reviewed and showed sodium 137, potassium 4.3, creatinine 0.76 and GFR >60  3: Tobacco use-  - says that he's smoking ~ 1-2 cigars daily  - complete cessation discussed for 2 minutes with him  4: Chronic alcohol use- - drinks 1/2-1 pint of vodka daily in addition to beer - fine hand tremors noted & worse from last time; emphasized that he really didn't need to be drinking any alcohol but he's not interested in stopping  5: Seizure- - thinks he's out of tegretol but admits that he's really not sure - emphasized that he not run out of this medication and that he needs to call his PCP for refill   Patient did not bring his medications nor a list. Each medication was verbally reviewed with the patient and he was encouraged to bring the bottles to every visit to confirm accuracy of list. Says that he's been out of "everything" but unknown for how long.   Medications sent to pharmacy today. Offered to order as 90 day prescriptions to make it easier on him but he  declines and asks that they all be 30 day supply.

## 2021-11-30 ENCOUNTER — Emergency Department
Admission: EM | Admit: 2021-11-30 | Discharge: 2021-11-30 | Disposition: A | Discharge status: Home / Self Care | Payer: BC Managed Care – PPO | Attending: Emergency Medicine | Admitting: Emergency Medicine

## 2021-11-30 ENCOUNTER — Telehealth: Payer: Self-pay

## 2021-11-30 ENCOUNTER — Ambulatory Visit (HOSPITAL_BASED_OUTPATIENT_CLINIC_OR_DEPARTMENT_OTHER): Payer: BC Managed Care – PPO | Admitting: Family

## 2021-11-30 ENCOUNTER — Encounter: Payer: Self-pay | Admitting: Family

## 2021-11-30 ENCOUNTER — Other Ambulatory Visit: Payer: Self-pay

## 2021-11-30 ENCOUNTER — Emergency Department: Payer: BC Managed Care – PPO

## 2021-11-30 ENCOUNTER — Other Ambulatory Visit: Payer: Self-pay | Admitting: Family

## 2021-11-30 VITALS — BP 173/99 | HR 85 | Resp 18 | Ht 70.0 in | Wt 216.0 lb

## 2021-11-30 DIAGNOSIS — R569 Unspecified convulsions: Secondary | ICD-10-CM

## 2021-11-30 DIAGNOSIS — M791 Myalgia, unspecified site: Secondary | ICD-10-CM | POA: Diagnosis not present

## 2021-11-30 DIAGNOSIS — I5032 Chronic diastolic (congestive) heart failure: Secondary | ICD-10-CM

## 2021-11-30 DIAGNOSIS — Z20822 Contact with and (suspected) exposure to covid-19: Secondary | ICD-10-CM | POA: Insufficient documentation

## 2021-11-30 DIAGNOSIS — F17200 Nicotine dependence, unspecified, uncomplicated: Secondary | ICD-10-CM

## 2021-11-30 DIAGNOSIS — R5383 Other fatigue: Secondary | ICD-10-CM | POA: Insufficient documentation

## 2021-11-30 DIAGNOSIS — R6883 Chills (without fever): Secondary | ICD-10-CM | POA: Insufficient documentation

## 2021-11-30 DIAGNOSIS — I10 Essential (primary) hypertension: Secondary | ICD-10-CM

## 2021-11-30 DIAGNOSIS — I272 Pulmonary hypertension, unspecified: Secondary | ICD-10-CM | POA: Diagnosis not present

## 2021-11-30 DIAGNOSIS — I509 Heart failure, unspecified: Secondary | ICD-10-CM | POA: Diagnosis not present

## 2021-11-30 DIAGNOSIS — R5381 Other malaise: Secondary | ICD-10-CM | POA: Insufficient documentation

## 2021-11-30 DIAGNOSIS — R531 Weakness: Secondary | ICD-10-CM | POA: Diagnosis not present

## 2021-11-30 DIAGNOSIS — I110 Hypertensive heart disease with heart failure: Secondary | ICD-10-CM | POA: Insufficient documentation

## 2021-11-30 DIAGNOSIS — J449 Chronic obstructive pulmonary disease, unspecified: Secondary | ICD-10-CM | POA: Insufficient documentation

## 2021-11-30 DIAGNOSIS — G459 Transient cerebral ischemic attack, unspecified: Secondary | ICD-10-CM | POA: Diagnosis not present

## 2021-11-30 DIAGNOSIS — F1010 Alcohol abuse, uncomplicated: Secondary | ICD-10-CM | POA: Diagnosis not present

## 2021-11-30 DIAGNOSIS — I5033 Acute on chronic diastolic (congestive) heart failure: Secondary | ICD-10-CM

## 2021-11-30 LAB — CBC
HCT: 34.6 % — ABNORMAL LOW (ref 39.0–52.0)
Hemoglobin: 11.6 g/dL — ABNORMAL LOW (ref 13.0–17.0)
MCH: 31.8 pg (ref 26.0–34.0)
MCHC: 33.5 g/dL (ref 30.0–36.0)
MCV: 94.8 fL (ref 80.0–100.0)
Platelets: 114 10*3/uL — ABNORMAL LOW (ref 150–400)
RBC: 3.65 MIL/uL — ABNORMAL LOW (ref 4.22–5.81)
RDW: 14.9 % (ref 11.5–15.5)
WBC: 5.5 10*3/uL (ref 4.0–10.5)
nRBC: 0 % (ref 0.0–0.2)

## 2021-11-30 LAB — BASIC METABOLIC PANEL
Anion gap: 8 (ref 5–15)
BUN: 15 mg/dL (ref 6–20)
CO2: 23 mmol/L (ref 22–32)
Calcium: 8.5 mg/dL — ABNORMAL LOW (ref 8.9–10.3)
Chloride: 106 mmol/L (ref 98–111)
Creatinine, Ser: 0.76 mg/dL (ref 0.61–1.24)
GFR, Estimated: >60 mL/min (ref >60)
Glucose, Bld: 100 mg/dL — ABNORMAL HIGH (ref 70–99)
Potassium: 4.3 mmol/L (ref 3.5–5.1)
Sodium: 137 mmol/L (ref 135–145)

## 2021-11-30 LAB — RESP PANEL BY RT-PCR (FLU A&B, COVID) ARPGX2
Influenza A by PCR: NEGATIVE
Influenza B by PCR: NEGATIVE
SARS Coronavirus 2 by RT PCR: NEGATIVE

## 2021-11-30 MED ORDER — AMLODIPINE BESYLATE 10 MG PO TABS: 10.0000 mg | ORAL_TABLET | Freq: Every day | ORAL | 5 refills | Status: DC

## 2021-11-30 MED ORDER — HYDRALAZINE HCL 50 MG PO TABS: 50.0000 mg | ORAL_TABLET | Freq: Three times a day (TID) | ORAL | 5 refills | Status: DC

## 2021-11-30 MED ORDER — FAMOTIDINE 20 MG PO TABS: 20.0000 mg | ORAL_TABLET | Freq: Two times a day (BID) | ORAL | 0 refills | Status: DC

## 2021-11-30 MED ORDER — ONDANSETRON 4 MG PO TBDP: 4.0000 mg | ORAL_TABLET | Freq: Three times a day (TID) | ORAL | 0 refills | Status: DC | PRN

## 2021-11-30 MED ORDER — FUROSEMIDE 20 MG PO TABS: 20.0000 mg | ORAL_TABLET | Freq: Every day | ORAL | 5 refills | Status: DC

## 2021-11-30 MED ORDER — CARVEDILOL 25 MG PO TABS: 25.0000 mg | ORAL_TABLET | Freq: Two times a day (BID) | ORAL | 5 refills | Status: DC

## 2021-11-30 MED ORDER — LISINOPRIL 40 MG PO TABS: 40.0000 mg | ORAL_TABLET | Freq: Every day | ORAL | 5 refills | Status: DC

## 2021-11-30 NOTE — ED Notes (Addendum)
Pt presents to ED by EMS with c/o of weakness. Pt states this happened while he was at work this morning. Pt states he felt weak this morning but states the weakness got worse throughout the morning. EMS states HX of seizures and stroke. Pt states runny nose for 1 month, pt denies any known sick contacts recently.   Pt has equal grips bilaterally in upper extrem and BLE strengths to BLE. Pt is A&OX4. NAD noted. VSS.   Pt is also tremulous at this time and states "I'm cold". Pt given warm blankets.

## 2021-11-30 NOTE — Telephone Encounter (Signed)
Called patient to ask if he could return today or tomorrow morning for a dose of IV lasix, for patient symptoms and 8 pound weight gain from three weeks ago, per Darylene Price, [place].  Patient stated they could come tomorrow morning. Same Day has made patient an appt for 10:30 AM on 12/01/21. Patient instructed on the time and place and he verbalized understanding. Instructed patient to call us the next day, 1/26 to update on weight and symptoms. Georg Ruddle, RN

## 2021-11-30 NOTE — ED Notes (Signed)
D/C and new reasons to return to ED discussed with pt. Pt educated to go straight to heart failure clinic for his apt today. Pt ambulatory with steady gait on D/C. NAD noted.

## 2021-11-30 NOTE — ED Provider Notes (Signed)
[facility] Provider Note    Event Date/Time   First MD Initiated Contact with Patient 11/30/21 [phone]     (approximate)   History   Weakness   HPI  Jose [name]. is a 51 y.o. male with a history of seizures hypertension alcohol abuse COPD CHF who comes ED complaining of fatigue and generalized weakness for the last 3 days.  No chest pain or shortness of breath.  He also has some chills and body aches.  No fever.  No focal pain complaints.  No vomiting or diarrhea.  He has been compliant with his medications.  No orthopnea, no peripheral edema.       Physical Exam   Triage Vital Signs: ED Triage Vitals  Enc Vitals Group     BP 11/30/21 0854 (!) 152/95     Pulse Rate 11/30/21 0854 80     Resp 11/30/21 0854 (!) 21     Temp 11/30/21 0854 98.9 F (37.2 C)     Temp Source 11/30/21 0854 Oral     SpO2 11/30/21 0854 98 %     Weight 11/30/21 0906 221 lb (100.2 kg)     Height --      Head Circumference --      Peak Flow --      Pain Score 11/30/21 0859 0     Pain Loc --      Pain Edu? --      Excl. in [place]? --     Most recent vital signs: Vitals:   11/30/21 0854 11/30/21 1000  BP: (!) 152/95 (!) 149/105  Pulse: 80 72  Resp: (!) 21 20  Temp: 98.9 F (37.2 C)   SpO2: 98% 93%     General: Awake, no distress.  CV:  Good peripheral perfusion.  Normal pulses.  Regular rate and rhythm Resp:  Normal effort.  Unlabored breathing.  Clear to auscultation bilaterally Abd:  No distention.  Soft, nontender Other:  No lower extremity edema.  Negative Homans' sign.  Symmetric calf circumference.  Moist oral mucosa   ED Results / Procedures / Treatments   Labs (all labs ordered are listed, but only abnormal results are displayed) Labs Reviewed  BASIC METABOLIC PANEL - Abnormal; Notable for the following components:      Result Value   Glucose, Bld 100 (*)    Calcium 8.5 (*)    All other components within normal limits  CBC - Abnormal; Notable for  the following components:   RBC 3.65 (*)    Hemoglobin 11.6 (*)    HCT 34.6 (*)    Platelets 114 (*)    All other components within normal limits  RESP PANEL BY RT-PCR (FLU A&B, COVID) ARPGX2  URINALYSIS, ROUTINE W REFLEX MICROSCOPIC  CBG MONITORING, ED     EKG  Interpreted by me Normal sinus rhythm rate of 76.  Normal axis and intervals.  Normal QRS ST segments and T waves.  1 PVC on the strip   RADIOLOGY Chest x-ray viewed and interpreted by me, appears unremarkable without edema or effusion.  No pneumothorax.  Radiology report reviewed    PROCEDURES:  Critical Care performed: No  Procedures   MEDICATIONS ORDERED IN ED: Medications - No data to display   IMPRESSION / MDM / [place] / ED COURSE  I reviewed the triage vital signs and the nursing notes.  Differential diagnosis includes, but is not limited to, viral illness, fatigue, dehydration, electrolyte abnormality, pulmonary edema, pleural effusion     Patient presents with generalized fatigue, no focal pain or shortness of breath.  He has been compliant with his medications considering the patient's symptoms, medical history, and physical examination today, I have low suspicion for ACS, PE, TAD, pneumothorax, carditis, mediastinitis, pneumonia, CHF, or sepsis.  Work-up is unremarkable including electrolytes, kidney function, chest x-ray.  He has an appointment at 4:00 PM today at the heart failure clinic already.  I think he is stable for discharge from the ED to follow-up.     FINAL CLINICAL IMPRESSION(S) / ED DIAGNOSES   Final diagnoses:  Malaise and fatigue  Chronic congestive heart failure, unspecified heart failure type ([place])     Rx / DC Orders   ED Discharge Orders          Ordered    ondansetron (ZOFRAN-ODT) 4 MG disintegrating tablet  Every 8 hours PRN        11/30/21 1030    famotidine (PEPCID) 20 MG tablet  2 times daily        11/30/21 1030              Note:  This document was prepared using Dragon voice recognition software and may include unintentional dictation errors.   Carrie Mew, MD 11/30/21 [phone]

## 2021-11-30 NOTE — Progress Notes (Signed)
Called patient to discuss IV lasix. He says that he will return tomorrow for this. Order placed for 80mg  IV lasix/ 44meq PO potassium at 10:30am tomorrow. He verbalized understanding.

## 2021-11-30 NOTE — Discharge Instructions (Signed)
Your chest x-ray and lab test today are okay.  I suspect your symptoms are due to a developing viral illness.  Your COVID test and flu test today are negative.  Please keep your appointment with the heart failure clinic this afternoon.

## 2021-11-30 NOTE — Patient Instructions (Signed)
Continue weighing daily and call for an overnight weight gain of 3 pounds or more or a weekly weight gain of more than 5 pounds.  ° °The Heart Failure Clinic will be moving around the corner to [id number] mid-February. Our phone number will remain the same ° °

## 2021-12-01 ENCOUNTER — Ambulatory Visit
Admission: RE | Admit: 2021-12-01 | Discharge: 2021-12-01 | Disposition: A | Discharge status: Home / Self Care | Payer: BC Managed Care – PPO | Source: Ambulatory Visit | Attending: Family | Admitting: Family

## 2021-12-01 DIAGNOSIS — I5033 Acute on chronic diastolic (congestive) heart failure: Secondary | ICD-10-CM | POA: Diagnosis not present

## 2021-12-01 LAB — BASIC METABOLIC PANEL
Anion gap: 10 (ref 5–15)
BUN: 11 mg/dL (ref 6–20)
CO2: 27 mmol/L (ref 22–32)
Calcium: 9.5 mg/dL (ref 8.9–10.3)
Chloride: 98 mmol/L (ref 98–111)
Creatinine, Ser: 0.38 mg/dL — ABNORMAL LOW (ref 0.61–1.24)
GFR, Estimated: >60 mL/min (ref >60)
Glucose, Bld: 106 mg/dL — ABNORMAL HIGH (ref 70–99)
Potassium: 3.8 mmol/L (ref 3.5–5.1)
Sodium: 135 mmol/L (ref 135–145)

## 2021-12-01 LAB — BRAIN NATRIURETIC PEPTIDE: B Natriuretic Peptide: 61.2 pg/mL (ref 0.0–100.0)

## 2021-12-01 MED ORDER — POTASSIUM CHLORIDE CRYS ER 20 MEQ PO TBCR: 40.0000 meq | EXTENDED_RELEASE_TABLET | Freq: Once | ORAL | Status: AC

## 2021-12-01 MED ORDER — POTASSIUM CHLORIDE CRYS ER 20 MEQ PO TBCR
EXTENDED_RELEASE_TABLET | ORAL | Status: AC
  Administered 2021-12-01: 14:00:00 40 meq via ORAL
  Filled 2021-12-01: qty 2

## 2021-12-01 MED ORDER — FUROSEMIDE 10 MG/ML IJ SOLN: 80.0000 mg | Freq: Once | INTRAMUSCULAR | Status: AC

## 2021-12-01 MED ORDER — FUROSEMIDE 10 MG/ML IJ SOLN
INTRAMUSCULAR | Status: AC
  Administered 2021-12-01: 14:00:00 80 mg via INTRAVENOUS
  Filled 2021-12-01: qty 8

## 2021-12-01 NOTE — Discharge Instructions (Signed)
Keep appointment with the heart failure clinic as scheduled.  If you have worsening symptoms contact the Heart Failure Clinic or your cardiologist.  Go to the ED is shortness of breath or chest pain.

## 2021-12-08 ENCOUNTER — Other Ambulatory Visit: Payer: Self-pay

## 2021-12-08 ENCOUNTER — Telehealth: Payer: Self-pay

## 2021-12-08 ENCOUNTER — Encounter: Payer: Self-pay | Admitting: Nurse Practitioner

## 2021-12-08 ENCOUNTER — Ambulatory Visit: Payer: BC Managed Care – PPO | Admitting: Nurse Practitioner

## 2021-12-08 VITALS — BP 134/84 | HR 73 | Temp 98.1°F | Wt 213.0 lb

## 2021-12-08 DIAGNOSIS — E538 Deficiency of other specified B group vitamins: Secondary | ICD-10-CM

## 2021-12-08 DIAGNOSIS — Z1211 Encounter for screening for malignant neoplasm of colon: Secondary | ICD-10-CM

## 2021-12-08 DIAGNOSIS — Z23 Encounter for immunization: Secondary | ICD-10-CM

## 2021-12-08 DIAGNOSIS — J449 Chronic obstructive pulmonary disease, unspecified: Secondary | ICD-10-CM

## 2021-12-08 DIAGNOSIS — G40909 Epilepsy, unspecified, not intractable, without status epilepticus: Secondary | ICD-10-CM | POA: Diagnosis not present

## 2021-12-08 DIAGNOSIS — I10 Essential (primary) hypertension: Secondary | ICD-10-CM

## 2021-12-08 DIAGNOSIS — I5032 Chronic diastolic (congestive) heart failure: Secondary | ICD-10-CM | POA: Diagnosis not present

## 2021-12-08 DIAGNOSIS — R569 Unspecified convulsions: Secondary | ICD-10-CM

## 2021-12-08 DIAGNOSIS — F10920 Alcohol use, unspecified with intoxication, uncomplicated: Secondary | ICD-10-CM

## 2021-12-08 MED ORDER — ALBUTEROL SULFATE HFA 108 (90 BASE) MCG/ACT IN AERS: 2.0000 | INHALATION_SPRAY | Freq: Four times a day (QID) | RESPIRATORY_TRACT | 1 refills | Status: DC | PRN

## 2021-12-08 MED ORDER — ISOSORBIDE MONONITRATE ER 30 MG PO TB24: 30.0000 mg | ORAL_TABLET | Freq: Every day | ORAL | 1 refills | Status: DC

## 2021-12-08 MED ORDER — ROSUVASTATIN CALCIUM 20 MG PO TABS: 20.0000 mg | ORAL_TABLET | Freq: Every day | ORAL | 1 refills | Status: DC

## 2021-12-08 MED ORDER — THIAMINE HCL 100 MG PO TABS: 100.0000 mg | ORAL_TABLET | Freq: Every day | ORAL | 1 refills | Status: DC

## 2021-12-08 NOTE — Progress Notes (Signed)
BP 134/84 (BP Location: Left Arm, Cuff Size: Large)    Pulse 73    Temp 98.1 F (36.7 C) (Oral)    Wt 213 lb (96.6 kg)    SpO2 95%    BMI 30.56 kg/m    Subjective:    Patient ID: Jose Dakins., male    DOB: 10-Oct-1971, 51 y.o.   MRN: [id number]  HPI: Jose Kretschmer. is a 51 y.o. male  Chief Complaint  Patient presents with   Congestive Heart Failure   Hyperlipidemia   Hypertension   HYPERTENSION Hypertension status: controlled  Satisfied with current treatment? yes Duration of hypertension: years BP monitoring frequency:  not checking BP range:  BP medication side effects:  no Medication compliance: excellent compliance Previous BP meds: hydralazine, amlodipine, carvedilol, and lisinopril Aspirin: no Recurrent headaches: no Visual changes: no Palpitations: no Dyspnea: no Chest pain: no Lower extremity edema: no Dizzy/lightheaded: no  COPD COPD status: stable Satisfied with current treatment?: yes Oxygen use: no Dyspnea frequency: yes Cough frequency:  Rescue inhaler frequency:   Limitation of activity: yes Productive cough:  Last Spirometry:  Pneumovax: Up to Date Influenza: Up to Date  CHF Patient is followed by Jose [name]. He saw her recently on 1/24.  Patient states he weighs himself at work.    Relevant past medical, surgical, family and social history reviewed and updated as indicated. Interim medical history since our last visit reviewed. Allergies and medications reviewed and updated.  Review of Systems  Eyes:  Negative for visual disturbance.  Respiratory:  Negative for shortness of breath.   Cardiovascular:  Negative for chest pain and leg swelling.  Neurological:  Negative for light-headedness and headaches.   Per HPI unless specifically indicated above     Objective:    BP 134/84 (BP Location: Left Arm, Cuff Size: Large)    Pulse 73    Temp 98.1 F (36.7 C) (Oral)    Wt 213 lb (96.6 kg)    SpO2 95%    BMI 30.56 kg/m   Wt Readings from  Last 3 Encounters:  12/08/21 213 lb (96.6 kg)  11/30/21 216 lb (98 kg)  11/30/21 221 lb (100.2 kg)    Physical Exam Vitals and nursing note reviewed.  Constitutional:      General: He is not in acute distress.    Appearance: Normal appearance. He is not ill-appearing, toxic-appearing or diaphoretic.  HENT:     Head: Normocephalic.     Right Ear: External ear normal.     Left Ear: External ear normal.     Nose: Nose normal. No congestion or rhinorrhea.     Mouth/Throat:     Mouth: Mucous membranes are moist.  Eyes:     General:        Right eye: No discharge.        Left eye: No discharge.     Extraocular Movements: Extraocular movements intact.     Conjunctiva/sclera: Conjunctivae normal.     Pupils: Pupils are equal, round, and reactive to light.  Cardiovascular:     Rate and Rhythm: Normal rate and regular rhythm.     Heart sounds: No murmur heard. Pulmonary:     Effort: Pulmonary effort is normal. No respiratory distress.     Breath sounds: Normal breath sounds. No wheezing, rhonchi or rales.  Abdominal:     General: Abdomen is flat. Bowel sounds are normal.  Musculoskeletal:     Cervical back: Normal range of motion  and neck supple.  Skin:    General: Skin is warm and dry.     Capillary Refill: Capillary refill takes less than 2 seconds.  Neurological:     General: No focal deficit present.     Mental Status: He is alert and oriented to person, place, and time.  Psychiatric:        Mood and Affect: Mood normal.        Behavior: Behavior normal.        Thought Content: Thought content normal.        Judgment: Judgment normal.    Results for orders placed or performed during the hospital encounter of 11/30/21  Resp Panel by RT-PCR (Flu A&B, Covid) Nasopharyngeal Swab   Specimen: Nasopharyngeal Swab; Nasopharyngeal(NP) swabs in vial transport medium  Result Value Ref Range   SARS Coronavirus 2 by RT PCR NEGATIVE NEGATIVE   Influenza A by PCR NEGATIVE NEGATIVE    Influenza B by PCR NEGATIVE NEGATIVE  Basic metabolic panel  Result Value Ref Range   Sodium 137 135 - 145 mmol/L   Potassium 4.3 3.5 - 5.1 mmol/L   Chloride 106 98 - 111 mmol/L   CO2 23 22 - 32 mmol/L   Glucose, Bld 100 (H) 70 - 99 mg/dL   BUN 15 6 - 20 mg/dL   Creatinine, Ser 0.76 0.61 - 1.24 mg/dL   Calcium 8.5 (L) 8.9 - 10.3 mg/dL   GFR, Estimated >60 >60 mL/min   Anion gap 8 5 - 15  CBC  Result Value Ref Range   WBC 5.5 4.0 - 10.5 K/uL   RBC 3.65 (L) 4.22 - 5.81 MIL/uL   Hemoglobin 11.6 (L) 13.0 - 17.0 g/dL   HCT 34.6 (L) 39.0 - 52.0 %   MCV 94.8 80.0 - 100.0 fL   MCH 31.8 26.0 - 34.0 pg   MCHC 33.5 30.0 - 36.0 g/dL   RDW 14.9 11.5 - 15.5 %   Platelets 114 (L) 150 - 400 K/uL   nRBC 0.0 0.0 - 0.2 %      Assessment & Plan:   Problem List Items Addressed This Visit       Cardiovascular and Mediastinum   Hypertension    Chronic.  Controlled.  Continue with current medication regimen.  Labs ordered today.  Return to clinic in 3 months for reevaluation.  Call sooner if concerns arise.        Relevant Medications   rosuvastatin (CRESTOR) 20 MG tablet   isosorbide mononitrate (IMDUR) 30 MG 24 hr tablet   Other Relevant Orders   Comp Met (CMET)   Chronic diastolic congestive heart failure (HCC) - Primary    Chronic.  Controlled. Patient followed up with Heart Failure clinic last week. Patient is not taking Imdur.  Will restart for patient during office visit.  Continue with follow up with HF clinic.  Follow up in 3 months. Call sooner if concerns arise.      Relevant Medications   rosuvastatin (CRESTOR) 20 MG tablet   isosorbide mononitrate (IMDUR) 30 MG 24 hr tablet   Other Relevant Orders   Comp Met (CMET)   Lipid Profile     Respiratory   COPD (chronic obstructive pulmonary disease) (HCC)    Stable off inhalers, continue to monitor. Smoking cessation recommended.  Refilled Albuterol.  Rarely uses.       Relevant Medications   albuterol (VENTOLIN HFA) 108 (90  Base) MCG/ACT inhaler   Other Relevant Orders   CBC w/Diff  Nervous and Auditory   Epilepsy ([place])    Chronic. Has not followed up with Neurology.  Has been off Tegretol.  Discussed with patient that he needs to follow up with Neurology. Patient unsure of who to call.  Information given to patient today for Dr. Lannie Fields office for him to call and make an appt.      Relevant Orders   CBC w/Diff     Other   Seizures (HCC)    Chronic. Has not followed up with Neurology.  Has been off Tegretol.  Discussed with patient that he needs to follow up with Neurology. Patient unsure of who to call.  Information given to patient today for Dr. Lannie Fields office for him to call and make an appt.      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 Drinks about a Pint of liquor daily. Started thiamine for patient during visit today.       Other Visit Diagnoses     Screening for colon cancer       Relevant Orders   Ambulatory referral to Gastroenterology   Need for influenza vaccination       Relevant Orders   Flu Vaccine QUAD 64moIM (Fluarix, Fluzone & Alfiuria Quad PF)   B12 deficiency       Labs ordered today. Patient having numbness and tingling of last two fingers on right hand since december. Will make recommendations based on results.   Relevant Orders   B12        Follow up plan: Return in about 3 months (around 03/07/2022) for HTN, HLD, DM2 FU.

## 2021-12-08 NOTE — Assessment & Plan Note (Signed)
Chronic.  Controlled.  Continue with current medication regimen.  Labs ordered today.  Return to clinic in 3 months for reevaluation.  Call sooner if concerns arise.   

## 2021-12-08 NOTE — Assessment & Plan Note (Signed)
Ongoing. Drinks about a Pint of liquor daily. Started thiamine for patient during visit today.

## 2021-12-08 NOTE — Patient Instructions (Signed)
Dr. Melrose Nakayama (neurologist) call to make an appt Address: [address] Jose [name] [name], [place] [address] Phone: [phone]

## 2021-12-08 NOTE — Assessment & Plan Note (Signed)
Chronic. Has not followed up with Neurology.  Has been off Tegretol.  Discussed with patient that he needs to follow up with Neurology. Patient unsure of who to call.  Information given to patient today for Dr. Lannie Fields office for him to call and make an appt.

## 2021-12-08 NOTE — Assessment & Plan Note (Signed)
Stable off inhalers, continue to monitor. Smoking cessation recommended.  Refilled Albuterol.  Rarely uses.

## 2021-12-08 NOTE — Telephone Encounter (Signed)
CALLED PATIENT NO ANSWER LEFT VOICEMAIL FOR A CALL BACK ? ?

## 2021-12-08 NOTE — Assessment & Plan Note (Signed)
Chronic.  Controlled. Patient followed up with Heart Failure clinic last week. Patient is not taking Imdur.  Will restart for patient during office visit.  Continue with follow up with HF clinic.  Follow up in 3 months. Call sooner if concerns arise.

## 2021-12-09 ENCOUNTER — Ambulatory Visit (INDEPENDENT_AMBULATORY_CARE_PROVIDER_SITE_OTHER): Payer: Self-pay | Admitting: Gastroenterology

## 2021-12-09 DIAGNOSIS — Z86010 Personal history of colonic polyps: Secondary | ICD-10-CM

## 2021-12-09 LAB — COMPREHENSIVE METABOLIC PANEL
ALT: 21 IU/L (ref 0–44)
AST: 25 IU/L (ref 0–40)
Albumin/Globulin Ratio: 1.5 (ref 1.2–2.2)
Albumin: 4.3 g/dL (ref 3.8–4.9)
Alkaline Phosphatase: 78 IU/L (ref 44–121)
BUN/Creatinine Ratio: 34 — ABNORMAL HIGH (ref 9–20)
BUN: 27 mg/dL — ABNORMAL HIGH (ref 6–24)
Bilirubin Total: 0.4 mg/dL (ref 0.0–1.2)
CO2: 21 mmol/L (ref 20–29)
Calcium: 9.7 mg/dL (ref 8.7–10.2)
Chloride: 104 mmol/L (ref 96–106)
Creatinine, Ser: 0.8 mg/dL (ref 0.76–1.27)
Globulin, Total: 2.8 g/dL (ref 1.5–4.5)
Glucose: 97 mg/dL (ref 70–99)
Potassium: 4.9 mmol/L (ref 3.5–5.2)
Sodium: 139 mmol/L (ref 134–144)
Total Protein: 7.1 g/dL (ref 6.0–8.5)
eGFR: 106 mL/min/{1.73_m2} (ref >59)

## 2021-12-09 LAB — CBC WITH DIFFERENTIAL/PLATELET
Basophils Absolute: 0.1 10*3/uL (ref 0.0–0.2)
Basos: 1 %
EOS (ABSOLUTE): 0.2 10*3/uL (ref 0.0–0.4)
Eos: 4 %
Hematocrit: 37.1 % — ABNORMAL LOW (ref 37.5–51.0)
Hemoglobin: 12.7 g/dL — ABNORMAL LOW (ref 13.0–17.7)
Immature Grans (Abs): 0 10*3/uL (ref 0.0–0.1)
Immature Granulocytes: 0 %
Lymphocytes Absolute: 1.8 10*3/uL (ref 0.7–3.1)
Lymphs: 36 %
MCH: 31.4 pg (ref 26.6–33.0)
MCHC: 34.2 g/dL (ref 31.5–35.7)
MCV: 92 fL (ref 79–97)
Monocytes Absolute: 0.7 10*3/uL (ref 0.1–0.9)
Monocytes: 15 %
Neutrophils Absolute: 2.2 10*3/uL (ref 1.4–7.0)
Neutrophils: 44 %
Platelets: 143 10*3/uL — ABNORMAL LOW (ref 150–450)
RBC: 4.05 x10E6/uL — ABNORMAL LOW (ref 4.14–5.80)
RDW: 13.1 % (ref 11.6–15.4)
WBC: 4.9 10*3/uL (ref 3.4–10.8)

## 2021-12-09 LAB — LIPID PANEL
Chol/HDL Ratio: 3.1 ratio (ref 0.0–5.0)
Cholesterol, Total: 192 mg/dL (ref 100–199)
HDL: 62 mg/dL (ref >39)
LDL Chol Calc (NIH): 102 mg/dL — ABNORMAL HIGH (ref 0–99)
Triglycerides: 160 mg/dL — ABNORMAL HIGH (ref 0–149)
VLDL Cholesterol Cal: 28 mg/dL (ref 5–40)

## 2021-12-09 LAB — VITAMIN B12: Vitamin B-12: 326 pg/mL (ref 232–1245)

## 2021-12-09 NOTE — Patient Instructions (Signed)
We received a referral from your PCP office about making a appointment with our office. We have tried to call you several times and have not been able to reach you. Please give our office a call at [phone] at your earliest convenience.   [facility].

## 2021-12-09 NOTE — Progress Notes (Signed)
Please let patient know that overall his lab work looks good. Recommend decreasing processed food and refined sugar intake.  Please remind him to make an appt with neurology to follow up in his seizures.

## 2021-12-10 NOTE — Progress Notes (Signed)
Didn't answer phone to schedule

## 2021-12-19 NOTE — Progress Notes (Unsigned)
Patient ID: Jose [name]., male    DOB: September 12, 1971, 51 y.o.   MRN: [id number]  Mr Meskill is a 51 y/o male with a history of hyperlipidemia, HTN, current tobacco/ alcohol use and chronic heart failure.   Echo report from 11/05/21 reviewed and showed an EF of 60-65% along with moderate LVH. Echo report from 01/29/2019 reviewed and showed an EF of 60-65%  Was in the ED 11/30/21 due to fatigue, weakness, chills and body aches. Workup unremarkable and he was released. Was in the ED 11/04/21 due to seizures due to alcohol use and being out of seizure medication for a couple of days while visiting family. Head CT negative for acute process. Given IV ativan for signs of alcohol withdrawal and admission offered. Patient declined admission and says that he's not interested in stopping drinking. Released with neurology referral.   He presents today for a follow-up visit with a chief complaint of     Past Medical History:  Diagnosis Date   Alcohol abuse    Cervicalgia    CHF (congestive heart failure) ([place])    Hyperlipidemia    Hypertension    Seizures ([place])    Past Surgical History:  Procedure Laterality Date   COLONOSCOPY WITH PROPOFOL N/A 01/16/2018   Procedure: COLONOSCOPY WITH PROPOFOL;  Surgeon: Lin Landsman, MD;  Location: [facility] ENDOSCOPY;  Service: Gastroenterology;  Laterality: N/A;   ESOPHAGOGASTRODUODENOSCOPY (EGD) WITH PROPOFOL N/A 01/16/2018   Procedure: ESOPHAGOGASTRODUODENOSCOPY (EGD) WITH PROPOFOL;  Surgeon: Lin Landsman, MD;  Location: [facility] ENDOSCOPY;  Service: Gastroenterology;  Laterality: N/A;   Family History  Problem Relation Age of Onset   Diabetes Mother    Colon cancer Mother    Hypertension Father    Multiple sclerosis Father    Social History   Tobacco Use   Smoking status: Every Day    Packs/day: 0.00    Types: Cigars, Cigarettes   Smokeless tobacco: Former   Tobacco comments:    2 cigars a day  Substance Use Topics   Alcohol use: Yes     Alcohol/week: 28.0 standard drinks    Types: 7 Cans of beer, 21 Shots of liquor per week    Comment: half pint liquor per day   No Known Allergies   Review of Systems  Constitutional:  Positive for fatigue (worse since seizure yesterday). Negative for appetite change.  HENT:  Positive for hearing loss. Negative for congestion and rhinorrhea.   Eyes: Negative.   Respiratory:  Positive for cough (very little) and shortness of breath (easily). Negative for chest tightness.   Cardiovascular:  Positive for palpitations. Negative for chest pain and leg swelling.  Gastrointestinal:  Negative for abdominal distention and abdominal pain.  Endocrine: Negative.   Genitourinary: Negative.   Musculoskeletal:  Positive for arthralgias (right knee). Negative for back pain.  Skin: Negative.   Allergic/Immunologic: Negative.   Neurological:  Positive for tremors (worse today) and light-headedness (little bit). Negative for dizziness.  Hematological:  Negative for adenopathy. Does not bruise/bleed easily.  Psychiatric/Behavioral:  Negative for dysphoric mood and sleep disturbance. The patient is not nervous/anxious.       Physical Exam Vitals and nursing note reviewed.  Constitutional:      Appearance: Normal appearance.  HENT:     Head: Normocephalic and atraumatic.     Right Ear: Decreased hearing noted.     Left Ear: Decreased hearing noted.  Eyes:     Comments: Eyelids puffy  Neck:  Vascular: No JVD.  Cardiovascular:     Rate and Rhythm: Normal rate and regular rhythm.  Pulmonary:     Effort: Pulmonary effort is normal.     Breath sounds: No wheezing, rhonchi or rales.  Abdominal:     General: There is distension.     Palpations: Abdomen is soft.     Tenderness: There is no abdominal tenderness.  Musculoskeletal:        General: Swelling (right knee) present. No tenderness.     Cervical back: Normal range of motion and neck supple.     Right lower leg: No edema.     Left lower  leg: No edema.  Skin:    General: Skin is warm and dry.  Neurological:     General: No focal deficit present.     Mental Status: He is alert and oriented to person, place, and time.     Motor: Tremor (fine tremors in hands/ lower arms) present.  Psychiatric:        Mood and Affect: Mood normal.        Behavior: Behavior normal.    Assessment & Plan:  1. 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 (LVH)- - NYHA class III - mildly fluid overloaded today with weight gain, swelling and shortness of breath (has been out of medications, unknown which ones, for an unknown amount of time - not weighing daily but does have working scales; emphasized to weigh daily so that he can call for an overnight weight gain of >2 pounds or a weekly weight gain of > 5 pounds - weight 216 pounds from last visit here 3 weeks ago - not adding salt and is trying to follow a low sodium diet - BNP 05/16/20 was 50.9  2: HTN- - BP  - saw PCP Mathis Dad) 12/08/21 - BMP from 12/08/21 reviewed and showed sodium 139, potassium 4.9, creatinine 0.8 and GFR 106  3: Tobacco use-  - says that he's smoking ~ 1-2 cigars daily  - complete cessation discussed for 2 minutes with him  4: Chronic alcohol use- - drinks 1/2-1 pint of vodka daily in addition to beer - fine hand tremors noted & worse from last time; emphasized that he really didn't need to be drinking any alcohol but he's not interested in stopping  5: Seizure- - thinks he's out of tegretol but admits that he's really not sure - emphasized that he not run out of this medication and that he needs to call his PCP for refill   Patient did not bring his medications nor a list. Each medication was verbally reviewed with the patient and he was encouraged to bring the bottles to every visit to confirm accuracy of list. Says that he's been out of "everything" but unknown for how long.

## 2021-12-21 ENCOUNTER — Ambulatory Visit: Payer: BC Managed Care – PPO | Admitting: Family

## 2021-12-21 ENCOUNTER — Encounter: Payer: Self-pay | Admitting: Family

## 2021-12-21 ENCOUNTER — Emergency Department: Payer: BC Managed Care – PPO

## 2021-12-21 ENCOUNTER — Other Ambulatory Visit: Payer: Self-pay

## 2021-12-21 ENCOUNTER — Ambulatory Visit (HOSPITAL_BASED_OUTPATIENT_CLINIC_OR_DEPARTMENT_OTHER): Payer: BC Managed Care – PPO | Admitting: Family

## 2021-12-21 ENCOUNTER — Inpatient Hospital Stay
Admission: EM | Admit: 2021-12-21 | Discharge: 2021-12-25 | DRG: 291 | Disposition: A | Discharge status: Home / Self Care | Payer: BC Managed Care – PPO | Source: Ambulatory Visit | Attending: Hospitalist | Admitting: Hospitalist

## 2021-12-21 VITALS — BP 147/117 | HR 107 | Ht 70.0 in | Wt 204.0 lb

## 2021-12-21 DIAGNOSIS — Z833 Family history of diabetes mellitus: Secondary | ICD-10-CM

## 2021-12-21 DIAGNOSIS — J209 Acute bronchitis, unspecified: Secondary | ICD-10-CM | POA: Diagnosis not present

## 2021-12-21 DIAGNOSIS — I5033 Acute on chronic diastolic (congestive) heart failure: Secondary | ICD-10-CM

## 2021-12-21 DIAGNOSIS — E785 Hyperlipidemia, unspecified: Secondary | ICD-10-CM | POA: Diagnosis not present

## 2021-12-21 DIAGNOSIS — Z800 Family history of malignant neoplasm of digestive organs: Secondary | ICD-10-CM

## 2021-12-21 DIAGNOSIS — Z8249 Family history of ischemic heart disease and other diseases of the circulatory system: Secondary | ICD-10-CM | POA: Diagnosis not present

## 2021-12-21 DIAGNOSIS — B349 Viral infection, unspecified: Secondary | ICD-10-CM

## 2021-12-21 DIAGNOSIS — R0602 Shortness of breath: Secondary | ICD-10-CM | POA: Insufficient documentation

## 2021-12-21 DIAGNOSIS — I10 Essential (primary) hypertension: Secondary | ICD-10-CM

## 2021-12-21 DIAGNOSIS — J441 Chronic obstructive pulmonary disease with (acute) exacerbation: Secondary | ICD-10-CM

## 2021-12-21 DIAGNOSIS — R251 Tremor, unspecified: Secondary | ICD-10-CM | POA: Insufficient documentation

## 2021-12-21 DIAGNOSIS — R059 Cough, unspecified: Secondary | ICD-10-CM | POA: Insufficient documentation

## 2021-12-21 DIAGNOSIS — J440 Chronic obstructive pulmonary disease with acute lower respiratory infection: Secondary | ICD-10-CM | POA: Diagnosis not present

## 2021-12-21 DIAGNOSIS — F17290 Nicotine dependence, other tobacco product, uncomplicated: Secondary | ICD-10-CM | POA: Diagnosis present

## 2021-12-21 DIAGNOSIS — F1010 Alcohol abuse, uncomplicated: Secondary | ICD-10-CM

## 2021-12-21 DIAGNOSIS — Z20822 Contact with and (suspected) exposure to covid-19: Secondary | ICD-10-CM | POA: Diagnosis present

## 2021-12-21 DIAGNOSIS — K219 Gastro-esophageal reflux disease without esophagitis: Secondary | ICD-10-CM | POA: Diagnosis present

## 2021-12-21 DIAGNOSIS — I110 Hypertensive heart disease with heart failure: Secondary | ICD-10-CM | POA: Insufficient documentation

## 2021-12-21 DIAGNOSIS — R0902 Hypoxemia: Secondary | ICD-10-CM | POA: Insufficient documentation

## 2021-12-21 DIAGNOSIS — I509 Heart failure, unspecified: Secondary | ICD-10-CM | POA: Diagnosis not present

## 2021-12-21 DIAGNOSIS — F17210 Nicotine dependence, cigarettes, uncomplicated: Secondary | ICD-10-CM | POA: Insufficient documentation

## 2021-12-21 DIAGNOSIS — Z820 Family history of epilepsy and other diseases of the nervous system: Secondary | ICD-10-CM | POA: Diagnosis not present

## 2021-12-21 DIAGNOSIS — R5383 Other fatigue: Secondary | ICD-10-CM | POA: Insufficient documentation

## 2021-12-21 DIAGNOSIS — J9601 Acute respiratory failure with hypoxia: Secondary | ICD-10-CM

## 2021-12-21 DIAGNOSIS — F17200 Nicotine dependence, unspecified, uncomplicated: Secondary | ICD-10-CM | POA: Diagnosis not present

## 2021-12-21 DIAGNOSIS — G40909 Epilepsy, unspecified, not intractable, without status epilepticus: Secondary | ICD-10-CM | POA: Diagnosis not present

## 2021-12-21 LAB — CBC WITH DIFFERENTIAL/PLATELET
Abs Immature Granulocytes: 0.06 10*3/uL (ref 0.00–0.07)
Basophils Absolute: 0.1 10*3/uL (ref 0.0–0.1)
Basophils Relative: 1 %
Eosinophils Absolute: 0.2 10*3/uL (ref 0.0–0.5)
Eosinophils Relative: 1 %
HCT: 37 % — ABNORMAL LOW (ref 39.0–52.0)
Hemoglobin: 12 g/dL — ABNORMAL LOW (ref 13.0–17.0)
Immature Granulocytes: 1 %
Lymphocytes Relative: 8 %
Lymphs Abs: 1 10*3/uL (ref 0.7–4.0)
MCH: 30.6 pg (ref 26.0–34.0)
MCHC: 32.4 g/dL (ref 30.0–36.0)
MCV: 94.4 fL (ref 80.0–100.0)
Monocytes Absolute: 2.3 10*3/uL — ABNORMAL HIGH (ref 0.1–1.0)
Monocytes Relative: 18 %
Neutro Abs: 9.1 10*3/uL — ABNORMAL HIGH (ref 1.7–7.7)
Neutrophils Relative %: 71 %
Platelets: 172 10*3/uL (ref 150–400)
RBC: 3.92 MIL/uL — ABNORMAL LOW (ref 4.22–5.81)
RDW: 14.6 % (ref 11.5–15.5)
WBC: 12.7 10*3/uL — ABNORMAL HIGH (ref 4.0–10.5)
nRBC: 0.6 % — ABNORMAL HIGH (ref 0.0–0.2)

## 2021-12-21 LAB — RESP PANEL BY RT-PCR (FLU A&B, COVID) ARPGX2
Influenza A by PCR: NEGATIVE
Influenza B by PCR: NEGATIVE
SARS Coronavirus 2 by RT PCR: NEGATIVE

## 2021-12-21 LAB — TROPONIN I (HIGH SENSITIVITY)
Troponin I (High Sensitivity): 14 ng/L (ref <18)
Troponin I (High Sensitivity): 16 ng/L (ref <18)

## 2021-12-21 LAB — PROCALCITONIN: Procalcitonin: 0.23 ng/mL

## 2021-12-21 LAB — BRAIN NATRIURETIC PEPTIDE: B Natriuretic Peptide: 209.2 pg/mL — ABNORMAL HIGH (ref 0.0–100.0)

## 2021-12-21 LAB — LACTIC ACID, PLASMA: Lactic Acid, Venous: 1.3 mmol/L (ref 0.5–1.9)

## 2021-12-21 MED ORDER — FUROSEMIDE 10 MG/ML IJ SOLN
40.0000 mg | Freq: Two times a day (BID) | INTRAMUSCULAR | Status: DC
  Administered 2021-12-22: 40 mg via INTRAVENOUS
  Filled 2021-12-21: qty 4

## 2021-12-21 MED ORDER — METHYLPREDNISOLONE SODIUM SUCC 40 MG IJ SOLR
40.0000 mg | Freq: Two times a day (BID) | INTRAMUSCULAR | Status: AC
  Administered 2021-12-21 – 2021-12-22 (×2): 40 mg via INTRAVENOUS
  Filled 2021-12-21 (×2): qty 1

## 2021-12-21 MED ORDER — LISINOPRIL 20 MG PO TABS
40.0000 mg | ORAL_TABLET | Freq: Every day | ORAL | Status: DC
  Administered 2021-12-22 – 2021-12-25 (×4): 40 mg via ORAL
  Filled 2021-12-21 (×4): qty 2

## 2021-12-21 MED ORDER — FOLIC ACID 1 MG PO TABS
1.0000 mg | ORAL_TABLET | Freq: Every day | ORAL | Status: DC
  Administered 2021-12-22 – 2021-12-25 (×4): 1 mg via ORAL
  Filled 2021-12-21 (×4): qty 1

## 2021-12-21 MED ORDER — FUROSEMIDE 10 MG/ML IJ SOLN
40.0000 mg | Freq: Once | INTRAMUSCULAR | Status: AC
  Administered 2021-12-21: 40 mg via INTRAVENOUS
  Filled 2021-12-21: qty 4

## 2021-12-21 MED ORDER — SODIUM CHLORIDE 0.9 % IV SOLN
2.0000 g | INTRAVENOUS | Status: DC
  Administered 2021-12-21: 2 g via INTRAVENOUS
  Filled 2021-12-21 (×2): qty 20

## 2021-12-21 MED ORDER — ENOXAPARIN SODIUM 60 MG/0.6ML IJ SOSY
0.5000 mg/kg | PREFILLED_SYRINGE | INTRAMUSCULAR | Status: DC
  Administered 2021-12-21 – 2021-12-24 (×4): 47.5 mg via SUBCUTANEOUS
  Filled 2021-12-21 (×4): qty 0.6

## 2021-12-21 MED ORDER — CARVEDILOL 25 MG PO TABS
25.0000 mg | ORAL_TABLET | Freq: Two times a day (BID) | ORAL | Status: DC
  Administered 2021-12-22 – 2021-12-25 (×7): 25 mg via ORAL
  Filled 2021-12-21 (×7): qty 1

## 2021-12-21 MED ORDER — ACETAMINOPHEN 325 MG PO TABS: 650.0000 mg | ORAL_TABLET | Freq: Four times a day (QID) | ORAL | Status: DC | PRN

## 2021-12-21 MED ORDER — PREDNISONE 20 MG PO TABS
40.0000 mg | ORAL_TABLET | Freq: Every day | ORAL | Status: DC
  Administered 2021-12-23 – 2021-12-25 (×3): 40 mg via ORAL
  Filled 2021-12-21 (×3): qty 2

## 2021-12-21 MED ORDER — MAGNESIUM HYDROXIDE 400 MG/5ML PO SUSP: 30.0000 mL | Freq: Every day | ORAL | Status: DC | PRN

## 2021-12-21 MED ORDER — ROSUVASTATIN CALCIUM 10 MG PO TABS
20.0000 mg | ORAL_TABLET | Freq: Every day | ORAL | Status: DC
  Administered 2021-12-22 – 2021-12-25 (×4): 20 mg via ORAL
  Filled 2021-12-21 (×4): qty 2

## 2021-12-21 MED ORDER — ALBUTEROL SULFATE (2.5 MG/3ML) 0.083% IN NEBU: 3.0000 mL | INHALATION_SOLUTION | Freq: Four times a day (QID) | RESPIRATORY_TRACT | Status: DC | PRN

## 2021-12-21 MED ORDER — ONDANSETRON HCL 4 MG PO TABS: 4.0000 mg | ORAL_TABLET | Freq: Four times a day (QID) | ORAL | Status: DC | PRN

## 2021-12-21 MED ORDER — FUROSEMIDE 10 MG/ML IJ SOLN: 60.0000 mg | Freq: Once | INTRAMUSCULAR | Status: DC

## 2021-12-21 MED ORDER — THIAMINE HCL 100 MG PO TABS
100.0000 mg | ORAL_TABLET | Freq: Every day | ORAL | Status: DC
  Administered 2021-12-22 – 2021-12-25 (×4): 100 mg via ORAL
  Filled 2021-12-21 (×4): qty 1

## 2021-12-21 MED ORDER — SODIUM CHLORIDE 0.9 % IV SOLN
500.0000 mg | Freq: Once | INTRAVENOUS | Status: AC
  Administered 2021-12-21: 500 mg via INTRAVENOUS
  Filled 2021-12-21: qty 5

## 2021-12-21 MED ORDER — TAB-A-VITE/IRON PO TABS
1.0000 | ORAL_TABLET | Freq: Every day | ORAL | Status: DC
  Administered 2021-12-22 – 2021-12-25 (×4): 1 via ORAL
  Filled 2021-12-21 (×4): qty 1

## 2021-12-21 MED ORDER — LORAZEPAM 2 MG/ML IJ SOLN: 1.0000 mg | INTRAMUSCULAR | Status: DC | PRN

## 2021-12-21 MED ORDER — ONDANSETRON HCL 4 MG/2ML IJ SOLN: 4.0000 mg | Freq: Four times a day (QID) | INTRAMUSCULAR | Status: DC | PRN

## 2021-12-21 MED ORDER — HYDRALAZINE HCL 50 MG PO TABS
50.0000 mg | ORAL_TABLET | Freq: Three times a day (TID) | ORAL | Status: DC
  Administered 2021-12-21 – 2021-12-25 (×11): 50 mg via ORAL
  Filled 2021-12-21 (×11): qty 1

## 2021-12-21 MED ORDER — AMLODIPINE BESYLATE 10 MG PO TABS
10.0000 mg | ORAL_TABLET | Freq: Every day | ORAL | Status: DC
  Administered 2021-12-22 – 2021-12-25 (×4): 10 mg via ORAL
  Filled 2021-12-21 (×4): qty 1

## 2021-12-21 MED ORDER — ACETAMINOPHEN 650 MG RE SUPP: 650.0000 mg | Freq: Four times a day (QID) | RECTAL | Status: DC | PRN

## 2021-12-21 MED ORDER — TRAZODONE HCL 50 MG PO TABS
25.0000 mg | ORAL_TABLET | Freq: Every evening | ORAL | Status: DC | PRN
  Administered 2021-12-22: 25 mg via ORAL
  Filled 2021-12-21: qty 1

## 2021-12-21 MED ORDER — ISOSORBIDE MONONITRATE ER 30 MG PO TB24
30.0000 mg | ORAL_TABLET | Freq: Every day | ORAL | Status: DC
  Administered 2021-12-22 – 2021-12-25 (×4): 30 mg via ORAL
  Filled 2021-12-21 (×4): qty 1

## 2021-12-21 NOTE — ED Provider Triage Note (Signed)
Emergency Medicine Provider Triage Evaluation Note  Jose [name]. , a 51 y.o. male  was evaluated in triage.  Patient has a history of chronic diastolic CHF, hypertension, hyperlipidemia, seizure disorder and COPD presents to the emergency department with new onset shortness of breath.  Patient has diffuse crackles throughout.  He has noticed some mild leg swelling.  Patient was referred from heart clinic for O2 sats in the 70s.  Review of Systems  Positive: Patient has cough, shortness of breath and crackles. Negative: No nausea, vomiting or abdominal pain.  Physical Exam  There were no vitals taken for this visit. Gen:   Alert Resp:  Patient tachypneic.  Patient has diffuse crackles to auscultation. MSK:   Moves extremities without difficulty  Other:    Medical Decision Making  Medically screening exam initiated at 4:18 PM.  Appropriate orders placed.  Jose [name]. was informed that the remainder of the evaluation will be completed by another provider, this initial triage assessment does not replace that evaluation, and the importance of remaining in the ED until their evaluation is complete.     Jose [name], [place] 12/21/21 1620

## 2021-12-21 NOTE — ED Notes (Signed)
EKG to EDP Quale in person.  ?

## 2021-12-21 NOTE — ED Notes (Signed)
Pt reports COPD history. O2 inc to 4L and pt sat 91%.

## 2021-12-21 NOTE — ED Notes (Signed)
Report received from [place], [place]

## 2021-12-21 NOTE — ED Provider Notes (Signed)
[facility] Provider Note    Event Date/Time   First MD Initiated Contact with Patient 12/21/21 1655     (approximate)   History   Shortness of Breath   HPI  Jose [name]. is a 51 y.o. male history of hypertension, chronic heart failure with preserved EF  Patient reports her getting short of breath some yesterday worsening today.  Feeling quite short of breath.  Also has had some nasal congestion and slight cough and runny nose feels like he is "getting a cold"  Also feels slightly swollen.  No chest pain.  Reports difficulty breathing feeling short of breath  He is a smoker but denies wheezing.  Reports "does not feel like bronchitis"     Physical Exam   Triage Vital Signs: ED Triage Vitals  Enc Vitals Group     BP 12/21/21 1616 (!) 156/95     Pulse Rate 12/21/21 1616 (!) 106     Resp 12/21/21 1616 (!) 25     Temp 12/21/21 1616 99.7 F (37.6 C)     Temp Source 12/21/21 1616 Oral     SpO2 12/21/21 1616 (!) 87 %     Weight 12/21/21 1620 213 lb (96.6 kg)     Height 12/21/21 1620 5\' 10"  (1.778 m)     Head Circumference --      Peak Flow --      Pain Score 12/21/21 1620 7     Pain Loc --      Pain Edu? --      Excl. in [place]? --     Most recent vital signs: Vitals:   12/21/21 1800 12/21/21 1900  BP: (!) 144/100 (!) 152/96  Pulse: 94 (!) 105  Resp: (!) 31 (!) 35  Temp:    SpO2: 97% 96%     General: Awake, moderate increased work of breathing mild hypoxia placed on 5 L nasal cannula saturation 92% CV:  Good peripheral perfusion.  Mild tachycardia, regular.  No murmurs rubs or gallops Resp:  Moderate increased work of breathing moderate accessory muscle use, notable rales left lower base slightly greater than right.  No wheezing. Abd:  Slight distention and no pain Other:  No notable peripheral edema   ED Results / Procedures / Treatments   Labs (all labs ordered are listed, but only abnormal results are displayed) Labs Reviewed   BRAIN NATRIURETIC PEPTIDE - Abnormal; Notable for the following components:      Result Value   B Natriuretic Peptide 209.2 (*)    All other components within normal limits  CBC WITH DIFFERENTIAL/PLATELET - Abnormal; Notable for the following components:   WBC 12.7 (*)    RBC 3.92 (*)    Hemoglobin 12.0 (*)    HCT 37.0 (*)    nRBC 0.6 (*)    Neutro Abs 9.1 (*)    Monocytes Absolute 2.3 (*)    All other components within normal limits  RESP PANEL BY RT-PCR (FLU A&B, COVID) ARPGX2  CULTURE, BLOOD (ROUTINE X 2)  CULTURE, BLOOD (ROUTINE X 2)  PROCALCITONIN  LACTIC ACID, PLASMA  TROPONIN I (HIGH SENSITIVITY)  TROPONIN I (HIGH SENSITIVITY)     EKG  Reviewed interval IV at 1625 Heart rate 100 QRS 89 QTc 440 Sinus tachycardia, mild baseline abnormality, slight nonspecific T wave abnormality.  No frank evidence of acute ischemia or STEMI   RADIOLOGY   Personally reviewed and interpreted the patient's chest x-ray, suspect interstitial edema or possibly  atypical pneumonia seeming less likely     PROCEDURES:  Critical Care performed: Yes, see critical care procedure note(s)  Procedures   MEDICATIONS ORDERED IN ED: Medications  azithromycin (ZITHROMAX) 500 mg in sodium chloride 0.9 % 250 mL IVPB (has no administration in time range)  furosemide (LASIX) injection 40 mg (40 mg Intravenous Given 12/21/21 1746)     CRITICAL CARE Performed by: Delman Kitten   Total critical care time: 25 minutes  Critical care time was exclusive of separately billable procedures and treating other patients.  Critical care was necessary to treat or prevent imminent or life-threatening deterioration.  Critical care was time spent personally by me on the following activities: development of treatment plan with patient and/or surrogate as well as nursing, discussions with consultants, evaluation of patient's response to treatment, examination of patient, obtaining history from patient or  surrogate, ordering and performing treatments and interventions, ordering and review of laboratory studies, ordering and review of radiographic studies, pulse oximetry and re-evaluation of patient's condition.     IMPRESSION / MDM / ASSESSMENT AND PLAN / ED COURSE  I reviewed the triage vital signs and the nursing notes.                              Differential diagnosis includes, but is not limited to, CHF exacerbation, pneumonia, acute viral etiology, ACS though this seems unlikely given lack of chest pain and reviewed EKG, also patient has a low-grade temperature cough runny nose congestion.  Seen at the heart failure clinic today, reviewed the patient's note from [facility] who advises concern for exacerbation of CHF, referred to the ED for further work-up     Patient maintained on cardiac monitor Labs and imaging are personally viewed interpreted by me including moderate leukocytosis negative COVID and viral testing  I have placed a consult to the hospitalist, Dr. Sidney Ace, and given the patient's condition and concern for acute pathology and hypoxic respiratory failure patient will be admitted for further care and management  Clinical Course as of 12/21/21 1922  Tue Dec 21, 2021  1653 Please note some delay in initially seeing and evaluating the patient on my part due to resuscitation of a critically ill patient  [MQ]    Clinical Course User Index [MQ] Delman Kitten, MD   ----------------------------------------- 7:22 PM on 12/21/2021 ----------------------------------------- Patient continuing to tolerate BiPAP well, he is resting comfortably much improved with regard to work of breathing.  He is understanding agreeable with admission  FINAL CLINICAL IMPRESSION(S) / ED DIAGNOSES   Final diagnoses:  Acute hypoxemic respiratory failure (HCC)  Acute on chronic congestive heart failure, unspecified heart failure type ([place])  Acute viral syndrome     Rx / DC Orders   ED  Discharge Orders     None        Note:  This document was prepared using Dragon voice recognition software and may include unintentional dictation errors.   Delman Kitten, MD 12/21/21 [place]

## 2021-12-21 NOTE — Progress Notes (Signed)
PHARMACIST - PHYSICIAN COMMUNICATION  CONCERNING:  Enoxaparin (Lovenox) for DVT Prophylaxis    RECOMMENDATION: Patient was prescribed enoxaparin 40mg  q24 hours for VTE prophylaxis.   Filed Weights   12/21/21 1620  Weight: 96.6 kg (213 lb)    Body mass index is 30.56 kg/m.  Estimated Creatinine Clearance: 125.9 mL/min (by C-G formula based on SCr of 0.8 mg/dL).  Based on [facility] patient is candidate for enoxaparin 0.5mg /kg TBW SQ every 24 hours based on BMI being >30.  DESCRIPTION: Pharmacy has adjusted enoxaparin dose per [facility] policy.  Patient is now receiving enoxaparin 47.5 mg every 24 hours   Benita Gutter 12/21/2021 7:44 PM

## 2021-12-21 NOTE — ED Notes (Signed)
Pt set to admit and verbally requesting covid swab.

## 2021-12-21 NOTE — H&P (Addendum)
[place]   PATIENT NAME: Jose [name]    MR#:  [id number]  DATE OF BIRTH:  1970/12/12  DATE OF ADMISSION:  12/21/2021  PRIMARY CARE PHYSICIAN: Jon Billings, NP   Patient is coming from: Home  REQUESTING/REFERRING PHYSICIAN: Delman Kitten, MD  CHIEF COMPLAINT:   Chief Complaint  Patient presents with   Shortness of Breath    HISTORY OF PRESENT ILLNESS:  Jose [name]. is a 51 y.o. African-American male with medical history significant for HFpEF, hypertension, dyslipidemia alcohol abuse and seizures, who presented to the ER with acute onset of worsening dyspnea over the last couple of days with associated dry cough without wheezing.  He admits to rhinorrhea and nasal congestion.  The patient admits to 3 pillow orthopnea, dyspnea on exertion, as well as paroxysmal nocturnal dyspnea.  He denies any lower extremity edema.  No fever or chills but he has been diaphoretic.  No chest pain or palpitations.  He smokes cigar and quit smoking cigarettes 3 years ago.  No nausea or vomiting or abdominal pain.  No dysuria, oliguria or hematuria or flank pain.   ED Course: When she came to the ER, temperature was 99.7, BP was 155/109 with respiratory rate of 25 and was up to 35 and heart rate was up to 105 and pulse oximetry was 87% on 3 L, 90% on 6 L of O2 by nasal cannula after she was placed on BiPAP with improvement.  Labs revealed a BNP was 209.2 compared to 61.2 a week ago.  High-sensitivity troponin was 14 and later 16.  Procalcitonin was 0.23 and lactic acid 1.3.  CBC showed leukocytosis of 12.7 with neutrophilia. EKG as reviewed by me : EKG showed sinus tachycardia with a rate of 101 with possible left atrial enlargement and Q waves anteroseptally. Imaging: 2 view chest x-ray showed interstitial pulmonary edema.  The patient was given 40 mg of IV Lasix and 500 mg of IV Zithromax.  He will be admitted to a PCU bed for further evaluation and management PAST MEDICAL HISTORY:   Past  Medical History:  Diagnosis Date   Alcohol abuse    Cervicalgia    CHF (congestive heart failure) (HCC)    Hyperlipidemia    Hypertension    Seizures ([place])     PAST SURGICAL HISTORY:   Past Surgical History:  Procedure Laterality Date   COLONOSCOPY WITH PROPOFOL N/A 01/16/2018   Procedure: COLONOSCOPY WITH PROPOFOL;  Surgeon: Lin Landsman, MD;  Location: [facility] ENDOSCOPY;  Service: Gastroenterology;  Laterality: N/A;   ESOPHAGOGASTRODUODENOSCOPY (EGD) WITH PROPOFOL N/A 01/16/2018   Procedure: ESOPHAGOGASTRODUODENOSCOPY (EGD) WITH PROPOFOL;  Surgeon: Lin Landsman, MD;  Location: [facility] ENDOSCOPY;  Service: Gastroenterology;  Laterality: N/A;    SOCIAL HISTORY:   Social History   Tobacco Use   Smoking status: Every Day    Packs/day: 0.00    Types: Cigars, Cigarettes   Smokeless tobacco: Former   Tobacco comments:    2 cigars a day  Substance Use Topics   Alcohol use: Yes    Alcohol/week: 28.0 standard drinks    Types: 7 Cans of beer, 21 Shots of liquor per week    Comment: half pint liquor per day    FAMILY HISTORY:   Family History  Problem Relation Age of Onset   Diabetes Mother    Colon cancer Mother    Hypertension Father    Multiple sclerosis Father     DRUG ALLERGIES:  No Known  Allergies  REVIEW OF SYSTEMS:   ROS As per history of present illness. All pertinent systems were reviewed above. Constitutional, HEENT, cardiovascular, respiratory, GI, GU, musculoskeletal, neuro, psychiatric, endocrine, integumentary and hematologic systems were reviewed and are otherwise negative/unremarkable except for positive findings mentioned above in the HPI.   MEDICATIONS AT HOME:   Prior to Admission medications   Medication Sig Start Date End Date Taking? Authorizing Provider  amLODipine (NORVASC) 10 MG tablet Take 1 tablet (10 mg total) by mouth daily. 11/30/21  Yes Alisa Graff, FNP  carvedilol (COREG) 25 MG tablet Take 1 tablet (25 mg  total) by mouth 2 (two) times daily with a meal. 11/30/21  Yes Darylene Price A, FNP  furosemide (LASIX) 20 MG tablet Take 1 tablet (20 mg total) by mouth daily. 11/30/21  Yes Darylene Price A, FNP  hydrALAZINE (APRESOLINE) 50 MG tablet Take 1 tablet (50 mg total) by mouth 3 (three) times daily. 11/30/21  Yes Darylene Price A, FNP  isosorbide mononitrate (IMDUR) 30 MG 24 hr tablet Take 1 tablet (30 mg total) by mouth daily. 12/08/21  Yes Jon Billings, NP  lisinopril (ZESTRIL) 40 MG tablet Take 1 tablet (40 mg total) by mouth daily. 11/30/21  Yes Darylene Price A, FNP  rosuvastatin (CRESTOR) 20 MG tablet Take 1 tablet (20 mg total) by mouth daily. 12/08/21  Yes Jon Billings, NP  thiamine 100 MG tablet Take 1 tablet (100 mg total) by mouth daily. 12/08/21  Yes Jon Billings, NP  albuterol (VENTOLIN HFA) 108 (90 Base) MCG/ACT inhaler Inhale 2 puffs into the lungs every 6 (six) hours as needed for wheezing or shortness of breath. 12/08/21   Jon Billings, NP  carbamazepine (TEGRETOL) 200 MG tablet Take 1 tablet (200 mg total) by mouth 2 (two) times daily. Patient not taking: Reported on 11/05/2021 08/04/21   Jon Billings, NP  celecoxib (CELEBREX) 200 MG capsule Take 200 mg by mouth 2 (two) times daily. Patient not taking: Reported on 12/08/2021    [provider]  famotidine (PEPCID) 20 MG tablet Take 1 tablet (20 mg total) by mouth 2 (two) times daily. Patient not taking: Reported on 12/08/2021 11/30/21   Carrie Mew, MD  ondansetron (ZOFRAN-ODT) 4 MG disintegrating tablet Take 1 tablet (4 mg total) by mouth every 8 (eight) hours as needed for nausea or vomiting. Patient not taking: Reported on 12/08/2021 11/30/21   Carrie Mew, MD  potassium chloride SA (KLOR-CON) 20 MEQ tablet Take 1 tablet (20 mEq total) by mouth daily. Patient not taking: Reported on 12/08/2021 08/04/21   Jon Billings, NP      VITAL SIGNS:  Blood pressure 136/78, pulse 99, temperature 99.9 F (37.7 C),  temperature source Oral, resp. rate (!) 35, height 5\' 10"  (1.778 m), weight 96.6 kg, SpO2 100 %.  PHYSICAL EXAMINATION:  Physical Exam  GENERAL:  51 y.o.-year-old patient lying in the bed in mild respiratory distress on BiPAP with conversational dyspnea  EYES: Pupils equal, round, reactive to light and accommodation. No scleral icterus. Extraocular muscles intact.  HEENT: Head atraumatic, normocephalic. Oropharynx and nasopharynx clear.  NECK:  Supple, no jugular venous distention. No thyroid enlargement, no tenderness.  LUNGS: Diminished bibasilar sound with bibasal rales.  Diffuse expiratory wheezes with diminished expiratory airflow and harsh vesicular breathing. CARDIOVASCULAR: Regular rate and rhythm, S1, S2 normal. No murmurs, rubs, or gallops.  ABDOMEN: Soft, nondistended, nontender. Bowel sounds present. No organomegaly or mass.  EXTREMITIES: No pedal edema, cyanosis, or clubbing.  NEUROLOGIC: Cranial nerves II  through XII are intact. Muscle strength 5/5 in all extremities. Sensation intact. Gait not checked.  PSYCHIATRIC: The patient is alert and oriented x 3.  Normal affect and good eye contact. SKIN: No obvious rash, lesion, or ulcer.   LABORATORY PANEL:   CBC Recent Labs  Lab 12/21/21 1639  WBC 12.7*  HGB 12.0*  HCT 37.0*  PLT 172   ------------------------------------------------------------------------------------------------------------------  Chemistries  No results for input(s): NA, K, CL, CO2, GLUCOSE, BUN, CREATININE, CALCIUM, MG, AST, ALT, ALKPHOS, BILITOT in the last 168 hours.  Invalid input(s): GFRCGP ------------------------------------------------------------------------------------------------------------------  Cardiac Enzymes No results for input(s): TROPONINI in the last 168 hours. ------------------------------------------------------------------------------------------------------------------  RADIOLOGY:  DG Chest 2 View  Result Date:  12/21/2021 CLINICAL DATA:  Shortness of breath, CHF EXAM: CHEST - 2 VIEW COMPARISON:  11/30/2021 FINDINGS: Frontal and lateral views of the chest demonstrate an unremarkable cardiac silhouette. There is persistent central vascular congestion, with diffuse increased interstitial prominence compatible with interstitial edema. No effusion or pneumothorax. No acute bony abnormalities. IMPRESSION: 1. Diffuse interstitial edema. Electronically Signed   By: Randa Ngo M.D.   On: 12/21/2021 17:05      IMPRESSION AND PLAN:  Principal Problem:   Acute CHF (congestive heart failure) ([place])  1.  Acute on chronic HFpEF with subsequent acute hypoxic respiratory failure requiring BiPAP.Marland Kitchen  The patient last 2D echo on 11/05/2021 revealed an EF of 60 to 65% with grade 1 diastolic dysfunction, mild dilatation of the right atrium. - The patient was admitted to a PCU bed. - We will continue diuresis with IV Lasix. - We will follow serial troponins. - We will continue Coreg and Imdur as well as Zestril and hydralazine. -Cardiology consult to be obtained. .-I notified Dr. Nehemiah Massed about the patient.  2.  COPD acute exacerbation likely secondary to acute bronchitis, contributing to his acute hypoxic respiratory failure.. - We will continue him on p.o. Zithromax and add IV Rocephin given severity of COPD exacerbation. - We will continue with DuoNebs 4 times daily and every 4 hours as needed. - Steroid therapy will be added with IV Solu-Medrol. - Mucolytic therapy will be provided.  3.  Essential hypertension. - We will continue Coreg, amlodipine, Zestril, hydralazine and Imdur.  4.  Alcohol abuse. - She will be monitored for alcohol withdrawal. - He will be placed on thiamine, folic acid and multivitamins. - We will place on as needed IV Ativan for alcohol withdrawal.  5.  GERD. - Continue H2 blocker therapy.  6.  Seizure disorder. - We will continue Tegretol.  7.  Dyslipidemia. - We will continue  statin therapy.  DVT prophylaxis: Lovenox. Advanced Care Planning:  Code Status: full code. Family Communication:  The plan of care was discussed in details with the patient (and family). I answered all questions. The patient agreed to proceed with the above mentioned plan. Further management will depend upon hospital course. Disposition Plan: Back to previous home environment Consults called: Cardiology. All the records are reviewed and case discussed with ED provider.  Status is: Inpatient  At the time of the admission, it appears that the appropriate admission status for this patient is inpatient.  This is judged to be reasonable and necessary in order to provide the required intensity of service to ensure the patient's safety given the presenting symptoms, physical exam findings and initial radiographic and laboratory data in the context of comorbid conditions.  The patient requires inpatient status due to high intensity of service, high risk of further deterioration and  high frequency of surveillance required.  I certify that at the time of admission, it is my clinical judgment that the patient will require inpatient hospital care extending more than 2 midnights.                            Dispo: The patient is from: Home              Anticipated d/c is to: Home              Patient currently is not medically stable to d/c.              Difficult to place patient: No  Authorized and performed by: Eugenie Norrie, MD Total critical care time: Approximately  55     minutes. Due to a high probability of clinically significant, life-threatening deterioration, the patient required my highest level of preparedness to intervene emergently and I personally spent this critical care time directly and personally managing the patient.  This critical care time included obtaining a history, examining the patient, pulse oximetry, ordering and review of studies, arranging urgent treatment with development of  management plan, evaluation of patient's response to treatment, frequent reassessment, and discussions with other providers. This critical care time was performed to assess and manage the high probability of imminent, life-threatening deterioration that could result in multiorgan failure.  It was exclusive of separately billable procedures and treating other patients and teaching time.    Christel Mormon M.D on 12/21/2021 at 7:43 PM  [facility]   From 7 PM-7 AM, contact night-coverage [email or web address]  CC: Primary care physician; Jon Billings, NP

## 2021-12-21 NOTE — Progress Notes (Signed)
Patient ID: Jose Dakins., male    DOB: October 30, 1971, 51 y.o.   MRN: [id number]  Jose [name] is a 50 y/o male with a history of hyperlipidemia, HTN, current tobacco/ alcohol use and chronic heart failure.   Echo report from 11/05/21 reviewed and showed an EF of 60-65% along with moderate LVH. Echo report from 01/29/2019 reviewed and showed an EF of 60-65%  Was in the ED 11/30/21 due to fatigue, weakness, chills and body aches. Workup unremarkable and he was released. Was in the ED 11/04/21 due to seizures due to alcohol use and being out of seizure medication for a couple of days while visiting family. Head CT negative for acute process. Given IV ativan for signs of alcohol withdrawal and admission offered. Patient declined admission and says that he's not interested in stopping drinking. Released with neurology referral.   He presents today for an urgent visit saying that he feels like he has "just a cold". Says that he's been short of breath for 2 days but then says it got "really bad" early this morning. Was going to take some mucinex but his mom said he needed to be seen. Denies missing any of his medications and says that he took all his morning medications earlier today. Says that he took a covid test yesterday and it was negative.   Denies any change in his diet. He has associated fatigue, productive cough, shortness of breath, palpitations, light-headedness, tremors and difficulty sleeping due to his SOB. He denies any chest pain or swelling. Unsure of current weight.   Feels like he's having difficulty in talking due to his shortness of breath.   Past Medical History:  Diagnosis Date   Alcohol abuse    Cervicalgia    CHF (congestive heart failure) (HCC)    Hyperlipidemia    Hypertension    Seizures ([place])    Past Surgical History:  Procedure Laterality Date   COLONOSCOPY WITH PROPOFOL N/A 01/16/2018   Procedure: COLONOSCOPY WITH PROPOFOL;  Surgeon: Lin Landsman, MD;  [facility]  ENDOSCOPY;  Service: Gastroenterology;  Laterality: N/A;   ESOPHAGOGASTRODUODENOSCOPY (EGD) WITH PROPOFOL N/A 01/16/2018   Procedure: ESOPHAGOGASTRODUODENOSCOPY (EGD) WITH PROPOFOL;  Surgeon: Lin Landsman, MD;  Location: [facility] ENDOSCOPY;  Service: Gastroenterology;  Laterality: N/A;   Family History  Problem Relation Age of Onset   Diabetes Mother    Colon cancer Mother    Hypertension Father    Multiple sclerosis Father    Social History   Tobacco Use   Smoking status: Every Day    Packs/day: 0.00    Types: Cigars, Cigarettes   Smokeless tobacco: Former   Tobacco comments:    2 cigars a day  Substance Use Topics   Alcohol use: Yes    Alcohol/week: 28.0 standard drinks    Types: 7 Cans of beer, 21 Shots of liquor per week    Comment: half pint liquor per day   No Known Allergies  Prior to Admission medications   Medication Sig Start Date End Date Taking? Authorizing Provider  albuterol (VENTOLIN HFA) 108 (90 Base) MCG/ACT inhaler Inhale 2 puffs into the lungs every 6 (six) hours as needed for wheezing or shortness of breath. 12/08/21   Jon Billings, NP  amLODipine (NORVASC) 10 MG tablet Take 1 tablet (10 mg total) by mouth daily. 11/30/21   Alisa Graff, FNP  carbamazepine (TEGRETOL) 200 MG tablet Take 1 tablet (200 mg total) by mouth 2 (two) times daily. Patient not  taking: Reported on 11/05/2021 08/04/21   Jon Billings, NP  carvedilol (COREG) 25 MG tablet Take 1 tablet (25 mg total) by mouth 2 (two) times daily with a meal. 11/30/21   Alisa Graff, FNP  celecoxib (CELEBREX) 200 MG capsule Take 200 mg by mouth 2 (two) times daily. Patient not taking: Reported on 12/08/2021    [provider]  famotidine (PEPCID) 20 MG tablet Take 1 tablet (20 mg total) by mouth 2 (two) times daily. Patient not taking: Reported on 12/08/2021 11/30/21   Carrie Mew, MD  furosemide (LASIX) 20 MG tablet Take 1 tablet (20 mg total) by mouth daily. 11/30/21   Alisa Graff, FNP  hydrALAZINE (APRESOLINE) 50 MG tablet Take 1 tablet (50 mg total) by mouth 3 (three) times daily. 11/30/21   Alisa Graff, FNP  isosorbide mononitrate (IMDUR) 30 MG 24 hr tablet Take 1 tablet (30 mg total) by mouth daily. 12/08/21   Jon Billings, NP  lisinopril (ZESTRIL) 40 MG tablet Take 1 tablet (40 mg total) by mouth daily. 11/30/21   Alisa Graff, FNP  ondansetron (ZOFRAN-ODT) 4 MG disintegrating tablet Take 1 tablet (4 mg total) by mouth every 8 (eight) hours as needed for nausea or vomiting. Patient not taking: Reported on 12/08/2021 11/30/21   Carrie Mew, MD  potassium chloride SA (KLOR-CON) 20 MEQ tablet Take 1 tablet (20 mEq total) by mouth daily. Patient not taking: Reported on 12/08/2021 08/04/21   Jon Billings, NP  rosuvastatin (CRESTOR) 20 MG tablet Take 1 tablet (20 mg total) by mouth daily. 12/08/21   Jon Billings, NP  thiamine 100 MG tablet Take 1 tablet (100 mg total) by mouth daily. 12/08/21   Jon Billings, NP   Review of Systems  Constitutional:  Positive for fatigue (easily). Negative for appetite change.  HENT:  Positive for hearing loss. Negative for congestion and rhinorrhea.   Eyes: Negative.   Respiratory:  Positive for cough (productive) and shortness of breath (easily). Negative for chest tightness.   Cardiovascular:  Positive for palpitations. Negative for chest pain and leg swelling.  Gastrointestinal:  Negative for abdominal distention and abdominal pain.  Endocrine: Negative.   Genitourinary: Negative.   Musculoskeletal:  Positive for arthralgias (right knee). Negative for back pain.  Skin: Negative.   Allergic/Immunologic: Negative.   Neurological:  Positive for tremors (worse today) and light-headedness (little bit). Negative for dizziness.  Hematological:  Negative for adenopathy. Does not bruise/bleed easily.  Psychiatric/Behavioral:  Positive for sleep disturbance (sleeping on 3 pillows). Negative for dysphoric mood. The patient  is not nervous/anxious.    Vitals:   12/21/21 1602  BP: (!) 147/117  Pulse: (!) 107  SpO2: (!) 83%  Weight: 204 lb (92.5 kg)  Height: 5\' 10"  (1.778 m)   Wt Readings from Last 3 Encounters:  12/21/21 213 lb (96.6 kg)  12/21/21 204 lb (92.5 kg)  12/08/21 213 lb (96.6 kg)   Lab Results  Component Value Date   CREATININE 0.80 12/08/2021   CREATININE 0.38 (L) 12/01/2021   CREATININE 0.76 11/30/2021   Physical Exam Vitals and nursing note reviewed.  Constitutional:      Appearance: Normal appearance.  HENT:     Head: Normocephalic and atraumatic.     Right Ear: Decreased hearing noted.     Left Ear: Decreased hearing noted.  Eyes:     Comments: Eyelids puffy  Neck:     Vascular: No JVD.  Cardiovascular:     Rate and Rhythm: Regular  rhythm. Tachycardia present.  Pulmonary:     Effort: Pulmonary effort is normal.     Breath sounds: Examination of the right-upper field reveals rales. Examination of the left-upper field reveals rales. Examination of the right-lower field reveals rales. Examination of the left-lower field reveals rales. Rales present. No wheezing or rhonchi.  Abdominal:     General: There is distension.     Palpations: Abdomen is soft.     Tenderness: There is no abdominal tenderness.  Musculoskeletal:        General: Swelling (right knee) present. No tenderness.     Cervical back: Normal range of motion and neck supple.     Right lower leg: No edema.     Left lower leg: No edema.  Skin:    General: Skin is warm and dry.  Neurological:     General: No focal deficit present.     Mental Status: He is alert and oriented to person, place, and time.     Motor: Tremor (fine tremors in hands/ lower arms) present.  Psychiatric:        Mood and Affect: Mood normal.        Behavior: Behavior normal.    Assessment & Plan:  1. Acute on 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 (LVH)- - NYHA class IV - fluid overloaded today  with worsening SOB, rales, HTN and hypoxia on room air - initially hypoxic on room air in the low 70's; placed on 3L and sat rose to 83% with increased WOB where he is visibly SOB even talking in short sentences - Jose [name], NT took patient directly to the ED with the oxygen still on; I called ED triage to explain patient symptoms - BNP 05/16/20 was 50.9  2: HTN- - BP elevated (147/117)  - saw PCP Jose [name]) 12/08/21 - BMP from 12/08/21 reviewed and showed sodium 139, potassium 4.9, creatinine 0.8 and GFR 106  3: Tobacco use-  - says that he's smoking ~ 1-2 cigars daily  - complete cessation discussed for 2 minutes with him  4: Chronic alcohol use- - drinks 1/2-1 pint of vodka daily in addition to beer - fine hand tremors noted & worse from last time; did not spend much time addressing alcohol use due to severity of SOB   Patient taken directly to the ED from the HF Clinic. HF Clinic f/u pending disposition

## 2021-12-21 NOTE — ED Triage Notes (Signed)
Pt in from heart clinic due to low sat while there. 70% RA per staff from clinic. Pt denies being on home O2 but is currently on 3L via [place]. Pt reports CHF history. Pt coughing and SOB.

## 2021-12-22 DIAGNOSIS — I509 Heart failure, unspecified: Secondary | ICD-10-CM

## 2021-12-22 LAB — RESPIRATORY PANEL BY PCR

## 2021-12-22 LAB — BASIC METABOLIC PANEL
Anion gap: 11 (ref 5–15)
BUN: 33 mg/dL — ABNORMAL HIGH (ref 6–20)
CO2: 27 mmol/L (ref 22–32)
Calcium: 8.9 mg/dL (ref 8.9–10.3)
Chloride: 99 mmol/L (ref 98–111)
Creatinine, Ser: 0.67 mg/dL (ref 0.61–1.24)
GFR, Estimated: >60 mL/min (ref >60)
Glucose, Bld: 145 mg/dL — ABNORMAL HIGH (ref 70–99)
Potassium: 4.3 mmol/L (ref 3.5–5.1)
Sodium: 137 mmol/L (ref 135–145)

## 2021-12-22 LAB — CBC
HCT: 38.1 % — ABNORMAL LOW (ref 39.0–52.0)
Hemoglobin: 12.1 g/dL — ABNORMAL LOW (ref 13.0–17.0)
MCH: 29.9 pg (ref 26.0–34.0)
MCHC: 31.8 g/dL (ref 30.0–36.0)
MCV: 94.1 fL (ref 80.0–100.0)
Platelets: 184 10*3/uL (ref 150–400)
RBC: 4.05 MIL/uL — ABNORMAL LOW (ref 4.22–5.81)
RDW: 14.2 % (ref 11.5–15.5)
WBC: 10 10*3/uL (ref 4.0–10.5)
nRBC: 0.5 % — ABNORMAL HIGH (ref 0.0–0.2)

## 2021-12-22 LAB — HIV ANTIBODY (ROUTINE TESTING W REFLEX): HIV Screen 4th Generation wRfx: NONREACTIVE

## 2021-12-22 MED ORDER — AZITHROMYCIN 250 MG PO TABS
500.0000 mg | ORAL_TABLET | Freq: Every day | ORAL | Status: DC
  Administered 2021-12-22 – 2021-12-25 (×4): 500 mg via ORAL
  Filled 2021-12-22 (×4): qty 2

## 2021-12-22 MED ORDER — IPRATROPIUM-ALBUTEROL 0.5-2.5 (3) MG/3ML IN SOLN
3.0000 mL | Freq: Four times a day (QID) | RESPIRATORY_TRACT | Status: DC
  Administered 2021-12-22 – 2021-12-24 (×6): 3 mL via RESPIRATORY_TRACT
  Filled 2021-12-22 (×6): qty 3

## 2021-12-22 MED ORDER — FUROSEMIDE 10 MG/ML IJ SOLN
40.0000 mg | Freq: Two times a day (BID) | INTRAMUSCULAR | Status: DC
  Administered 2021-12-22 – 2021-12-25 (×6): 40 mg via INTRAVENOUS
  Filled 2021-12-22 (×7): qty 4

## 2021-12-22 MED ORDER — CARBAMAZEPINE 200 MG PO TABS
200.0000 mg | ORAL_TABLET | Freq: Two times a day (BID) | ORAL | Status: DC
  Administered 2021-12-22 – 2021-12-25 (×7): 200 mg via ORAL
  Filled 2021-12-22 (×8): qty 1

## 2021-12-22 MED ORDER — CARBAMAZEPINE 200 MG PO TABS
200.0000 mg | ORAL_TABLET | Freq: Two times a day (BID) | ORAL | Status: DC
  Filled 2021-12-22: qty 1

## 2021-12-22 NOTE — Evaluation (Signed)
Physical Therapy Evaluation Patient Details Name: Jose [name]. MRN: [id number] DOB: 01/08/1971 Today's Date: 12/22/2021  History of Present Illness  51 y.o. African-American male with medical history significant for HFpEF, hypertension, dyslipidemia alcohol abuse and seizures, who presented to the ER with acute onset of worsening dyspnea over the last couple of days with associated dry cough without wheezing.  He admits to rhinorrhea and nasal congestion.  The patient admits to 3 pillow orthopnea, dyspnea on exertion, as well as paroxysmal nocturnal dyspnea.  He denies any lower extremity edema.  No fever or chills but he has been diaphoretic.  Clinical Impression  Pt is a pleasant 51 year old male who was admitted for acute CHF. Pt performs bed mobility, transfers, and ambulation with independence. All mobility performed on O2 ranging from 2-3L. Encouraged continued mobility efforts with nursing staff to prevent deconditioning. Pt does not require any further PT needs at this time. Pt will be dc in house and does not require follow up. RN aware. Will dc current orders.  SaO2 on room air at rest = 85% SaO2 on room air while ambulating = 86% SaO2 on 3 liters of O2 while ambulating = 92%       Recommendations for follow up therapy are one component of a multi-disciplinary discharge planning process, led by the attending physician.  Recommendations may be updated based on patient status, additional functional criteria and insurance authorization.  Follow Up Recommendations No PT follow up    Assistance Recommended at Discharge None  Patient can return home with the following       Equipment Recommendations None recommended by PT  Recommendations for Other Services       Functional Status Assessment Patient has not had a recent decline in their functional status     Precautions / Restrictions Precautions Precautions: Fall Restrictions Weight Bearing Restrictions: No       Mobility  Bed Mobility Overal bed mobility: Independent             General bed mobility comments: safe technique    Transfers Overall transfer level: Independent Equipment used: None Transfers: Sit to/from Stand Sit to Stand: Independent           General transfer comment: ease of transfer. Indep    Ambulation/Gait Ambulation/Gait assistance: Independent Gait Distance (Feet): 200 Feet Assistive device: None Gait Pattern/deviations: WFL(Within Functional Limits)       General Gait Details: ambulated in hallway with no AD. No SOB present, however does fatigue with distance. All mobility performed on 2-3L of O2.  Stairs            Wheelchair Mobility    Modified Rankin (Stroke Patients Only)       Balance Overall balance assessment: No apparent balance deficits (not formally assessed)                                           Pertinent Vitals/Pain Pain Assessment Pain Assessment: No/denies pain    Home Living Family/patient expects to be discharged to:: Private residence Living Arrangements: Parent (mother) Available Help at Discharge: Family;Available PRN/intermittently Type of Home: House Home Access: Stairs to enter Entrance Stairs-Rails: Right;Left Entrance Stairs-Number of Steps: 3 Alternate Level Stairs-Number of Steps: He has an apartment in the basement with full bath. 13 steps down to basement or can reach from outside. He reports he can also stay  on main floor of home. Home Layout: Two level;Able to live on main level with bedroom/bathroom Home Equipment: Rolling Walker (2 wheels);Cane - single point Additional Comments: unsure of location of equipment as he does not use    Prior Function Prior Level of Function : Independent/Modified Independent             Mobility Comments: reports he has no falls. Prefers to sleep in basement       Hand Dominance        Extremity/Trunk Assessment   Upper  Extremity Assessment Upper Extremity Assessment: Overall WFL for tasks assessed    Lower Extremity Assessment Lower Extremity Assessment: Overall WFL for tasks assessed       Communication   Communication: No difficulties  Cognition Arousal/Alertness: Awake/alert Behavior During Therapy: WFL for tasks assessed/performed Overall Cognitive Status: Within Functional Limits for tasks assessed                                          General Comments      Exercises Other Exercises Other Exercises: ambulated to bathroom. Able to toilet and perform self hygiene with independence   Assessment/Plan    PT Assessment Patient does not need any further PT services  PT Problem List         PT Treatment Interventions      PT Goals (Current goals can be found in the Care Plan section)  Acute Rehab PT Goals Patient Stated Goal: to go home PT Goal Formulation: All assessment and education complete, DC therapy Time For Goal Achievement: 12/22/21 Potential to Achieve Goals: Good    Frequency       Co-evaluation               AM-PAC PT "6 Clicks" Mobility  Outcome Measure Help needed turning from your back to your side while in a flat bed without using bedrails?: None Help needed moving from lying on your back to sitting on the side of a flat bed without using bedrails?: None Help needed moving to and from a bed to a chair (including a wheelchair)?: None Help needed standing up from a chair using your arms (e.g., wheelchair or bedside chair)?: None Help needed to walk in hospital room?: None Help needed climbing 3-5 steps with a railing? : A Little 6 Click Score: 23    End of Session Equipment Utilized During Treatment: Oxygen Activity Tolerance: Patient tolerated treatment well Patient left: in bed (with RN admin meds) Nurse Communication: Mobility status PT Visit Diagnosis: Difficulty in walking, not elsewhere classified (R26.2)    Time:  1400-1415 PT Time Calculation (min) (ACUTE ONLY): 15 min   Charges:   PT Evaluation $PT Eval Low Complexity: 1 Low PT Treatments $Gait Training: 8-22 mins        Greggory Stallion, PT, DPT, GCS [phone]   Torah Pinnock 12/22/2021, 3:16 PM

## 2021-12-22 NOTE — Plan of Care (Signed)

## 2021-12-22 NOTE — Progress Notes (Addendum)
PROGRESS NOTE    Jose [name].  ZJI:[id number] DOB: 1971-08-26 DOA: 12/21/2021 PCP: Jon Billings, NP  [phone]   Assessment & Plan:   Principal Problem:   Acute CHF (congestive heart failure) (HCC)   Jose [name]. is a 51 y.o. African-American male with medical history significant for HFpEF, hypertension, alcohol abuse and seizures, who presented to the ER with worsening dyspnea over the last couple of days with associated dry cough.  He admits to rhinorrhea and nasal congestion.  The patient admits to 3 pillow orthopnea, dyspnea on exertion, as well as paroxysmal nocturnal dyspnea.   Acute hypoxemic respiratory failure --requiring BiPAP.  weaned down to 2L this morning. --started tx for both CHF and COPD exacerbation on admission. --Continue supplemental O2 to keep sats >=92%, wean as tolerated  1.  Acute on chronic HFpEF with subsequent  The patient last 2D echo on 11/05/2021 revealed an EF of 60 to 65% with grade 1 diastolic dysfunction --cont IV lasix 40 mg BID  2.  COPD acute exacerbation likely secondary to acute bronchitis --started on IV solumedrol, ceftriaxone and azithromycin Plan: --cont steroid --cont azithromycin --d/c ceftriaxone --start DuoNeb QID  3.  Essential hypertension. -on home Coreg, amlodipine, Zestril, hydralazine and Imdur. --cont home regimen --cont IV diuresis  4.  Alcohol abuse. --CIWA --cont folic acid and thiamine  6.  Seizure disorder. - We will continue Tegretol.  7.  Dyslipidemia. - We will continue statin therapy.   DVT prophylaxis: Lovenox SQ Code Status: Full code  Family Communication: aunt updated on speaker phone  Level of care: Progressive Dispo:   The patient is from: home Anticipated d/c is to: home Anticipated d/c date is: 1-2 days Patient currently is not medically ready to d/c due to: new hypoxia, on IV lasix   Subjective and Interval History:  Pt continued to have frequent cough, though denied  sputum production.  O2 requirement decreased but still on 2L.   Objective: Vitals:   12/22/21 0439 12/22/21 0500 12/22/21 0802 12/22/21 1124  BP: 132/84  (!) 139/93 114/77  Pulse: 90  84 84  Resp: 20  19 18   Temp: 99.4 F (37.4 C)  98.4 F (36.9 C) 97.9 F (36.6 C)  TempSrc:      SpO2: 98%  98% 93%  Weight:  91.1 kg    Height:        Intake/Output Summary (Last 24 hours) at 12/22/2021 1504 Last data filed at 12/22/2021 1408 Gross per 24 hour  Intake 340 ml  Output 1000 ml  Net -660 ml   Filed Weights   12/21/21 1620 12/22/21 0500  Weight: 96.6 kg 91.1 kg    Examination:   Constitutional: NAD, AAOx3 HEENT: conjunctivae and lids normal, EOMI CV: No cyanosis.   RESP: normal respiratory effort, on 2L Extremities: No effusions, edema in BLE SKIN: warm, dry Neuro: II - XII grossly intact.   Psych: Normal mood and affect.  Appropriate judgement and reason   Data Reviewed: I have personally reviewed following labs and imaging studies  CBC: Recent Labs  Lab 12/21/21 1639 12/22/21 0628  WBC 12.7* 10.0  NEUTROABS 9.1*  --   HGB 12.0* 12.1*  HCT 37.0* 38.1*  MCV 94.4 94.1  PLT 172 778   Basic Metabolic Panel: Recent Labs  Lab 12/22/21 0628  NA 137  K 4.3  CL 99  CO2 27  GLUCOSE 145*  BUN 33*  CREATININE 0.67  CALCIUM 8.9   GFR: Estimated Creatinine Clearance:  122.5 mL/min (by C-G formula based on SCr of 0.67 mg/dL). Liver Function Tests: No results for input(s): AST, ALT, ALKPHOS, BILITOT, PROT, ALBUMIN in the last 168 hours. No results for input(s): LIPASE, AMYLASE in the last 168 hours. No results for input(s): AMMONIA in the last 168 hours. Coagulation Profile: No results for input(s): INR, PROTIME in the last 168 hours. Cardiac Enzymes: No results for input(s): CKTOTAL, CKMB, CKMBINDEX, TROPONINI in the last 168 hours. BNP (last 3 results) No results for input(s): PROBNP in the last 8760 hours. HbA1C: No results for input(s): HGBA1C in the last  72 hours. CBG: No results for input(s): GLUCAP in the last 168 hours. Lipid Profile: No results for input(s): CHOL, HDL, LDLCALC, TRIG, CHOLHDL, LDLDIRECT in the last 72 hours. Thyroid Function Tests: No results for input(s): TSH, T4TOTAL, FREET4, T3FREE, THYROIDAB in the last 72 hours. Anemia Panel: No results for input(s): VITAMINB12, FOLATE, FERRITIN, TIBC, IRON, RETICCTPCT in the last 72 hours. Sepsis Labs: Recent Labs  Lab 12/21/21 1639 12/21/21 1739  PROCALCITON 0.23  --   LATICACIDVEN  --  1.3    Recent Results (from the past 240 hour(s))  Resp Panel by RT-PCR (Flu A&B, Covid) Nasopharyngeal Swab     Status: None   Collection Time: 12/21/21  4:41 PM   Specimen: Nasopharyngeal Swab; Nasopharyngeal(NP) swabs in vial transport medium  Result Value Ref Range Status   SARS Coronavirus 2 by RT PCR NEGATIVE NEGATIVE Final    Comment: (NOTE) SARS-CoV-2 target nucleic acids are NOT DETECTED.  The SARS-CoV-2 RNA is generally detectable in upper respiratory specimens during the acute phase of infection. The lowest concentration of SARS-CoV-2 viral copies this assay can detect is 138 copies/mL. A negative result does not preclude SARS-Cov-2 infection and should not be used as the sole basis for treatment or other patient management decisions. A negative result may occur with  improper specimen collection/handling, submission of specimen other than nasopharyngeal swab, presence of viral mutation(s) within the areas targeted by this assay, and inadequate number of viral copies(<138 copies/mL). A negative result must be combined with clinical observations, patient history, and epidemiological information. The expected result is Negative.  Fact Sheet for Patients:  EntrepreneurPulse.com.au  Fact Sheet for Healthcare Providers:  IncredibleEmployment.be  This test is no t yet approved or cleared by the [place] FDA and  has been authorized  for detection and/or diagnosis of SARS-CoV-2 by FDA under an Emergency Use Authorization (EUA). This EUA will remain  in effect (meaning this test can be used) for the duration of the COVID-19 declaration under Section 564(b)(1) of the Act, 21 U.S.C.section 360bbb-3(b)(1), unless the authorization is terminated  or revoked sooner.       Influenza A by PCR NEGATIVE NEGATIVE Final   Influenza B by PCR NEGATIVE NEGATIVE Final    Comment: (NOTE) The Xpert Xpress SARS-CoV-2/FLU/RSV plus assay is intended as an aid in the diagnosis of influenza from Nasopharyngeal swab specimens and should not be used as a sole basis for treatment. Nasal washings and aspirates are unacceptable for Xpert Xpress SARS-CoV-2/FLU/RSV testing.  Fact Sheet for Patients: EntrepreneurPulse.com.au  Fact Sheet for Healthcare Providers: IncredibleEmployment.be  This test is not yet approved or cleared by the [place] FDA and has been authorized for detection and/or diagnosis of SARS-CoV-2 by FDA under an Emergency Use Authorization (EUA). This EUA will remain in effect (meaning this test can be used) for the duration of the COVID-19 declaration under Section 564(b)(1) of the Act, 21 U.S.C.  section 360bbb-3(b)(1), unless the authorization is terminated or revoked.  Performed at [facility], [address] [place], [place] [address]   Culture, blood (Routine X 2) w Reflex to ID Panel     Status: None (Preliminary result)   Collection Time: 12/21/21  5:39 PM   Specimen: BLOOD  Result Value Ref Range Status   Specimen Description BLOOD RIGHT ASSIST CONTROL  Final   Special Requests   Final    BOTTLES DRAWN AEROBIC AND ANAEROBIC Blood Culture results may not be optimal due to an excessive volume of blood received in culture bottles   Culture   Final    NO GROWTH < 24 HOURS Performed at [facility], [address], [place], [place] [address]     Report Status PENDING  Incomplete  Culture, blood (Routine X 2) w Reflex to ID Panel     Status: None (Preliminary result)   Collection Time: 12/21/21  5:39 PM   Specimen: BLOOD  Result Value Ref Range Status   Specimen Description BLOOD RIGHT FOREARM  Final   Special Requests   Final    BOTTLES DRAWN AEROBIC AND ANAEROBIC Blood Culture results may not be optimal due to an excessive volume of blood received in culture bottles   Culture   Final    NO GROWTH < 24 HOURS Performed at [facility], [address] [place], [place] [address]    Report Status PENDING  Incomplete      Radiology Studies: DG Chest 2 View  Result Date: 12/21/2021 CLINICAL DATA:  Shortness of breath, CHF EXAM: CHEST - 2 VIEW COMPARISON:  11/30/2021 FINDINGS: Frontal and lateral views of the chest demonstrate an unremarkable cardiac silhouette. There is persistent central vascular congestion, with diffuse increased interstitial prominence compatible with interstitial edema. No effusion or pneumothorax. No acute bony abnormalities. IMPRESSION: 1. Diffuse interstitial edema. Electronically Signed   By: Randa Ngo M.D.   On: 12/21/2021 17:05     Scheduled Meds:  amLODipine  10 mg Oral Daily   carbamazepine  200 mg Oral BID   carvedilol  25 mg Oral BID WC   enoxaparin (LOVENOX) injection  0.5 mg/kg Subcutaneous V94I   folic acid  1 mg Oral Daily   furosemide  40 mg Intravenous BID   hydrALAZINE  50 mg Oral TID   isosorbide mononitrate  30 mg Oral Daily   lisinopril  40 mg Oral Daily   multivitamins with iron  1 tablet Oral Daily   [START ON 12/23/2021] predniSONE  40 mg Oral Q breakfast   rosuvastatin  20 mg Oral Daily   thiamine  100 mg Oral Daily   Continuous Infusions:   LOS: 1 day     Enzo Bi, MD [facility] If 7PM-7AM, please contact night-coverage 12/22/2021, 3:04 PM

## 2021-12-22 NOTE — Discharge Instructions (Signed)

## 2021-12-22 NOTE — Evaluation (Signed)
Occupational Therapy Evaluation Patient Details Name: Jose [name]. MRN: [id number] DOB: 11/24/1970 Today's Date: 12/22/2021   History of Present Illness 51 y.o. African-American male with medical history significant for HFpEF, hypertension, dyslipidemia alcohol abuse and seizures, who presented to the ER with acute onset of worsening dyspnea over the last couple of days with associated dry cough without wheezing.  He admits to rhinorrhea and nasal congestion.  The patient admits to 3 pillow orthopnea, dyspnea on exertion, as well as paroxysmal nocturnal dyspnea.  He denies any lower extremity edema.  No fever or chills but he has been diaphoretic.   Clinical Impression   Patient presenting with decreased Ind in self care,balance, functional mobility/transfer, endurance, and safety awareness. Patient with reports being independent at baseline without use of AD. Pt lives with mother but she cares for herself and they share IADL tasks. Pt does not drive so his mother or cousin take him to appointments. Pt lives in basement apartment so he has a flight of stairs to go into basement or to main house. Pt can stay on main level if needed.  Patient currently functioning at supervision level on 2 L O2 via [place]. Pt's O2 saturation remaining at 90% or better with activity. Pt ambulating without use of AD but fatigues quickly.  Patient will benefit from acute OT to increase overall independence in the areas of ADLs, functional mobility, and safety awareness in order to safely discharge home.     Recommendations for follow up therapy are one component of a multi-disciplinary discharge planning process, led by the attending physician.  Recommendations may be updated based on patient status, additional functional criteria and insurance authorization.   Follow Up Recommendations  Home health OT    Assistance Recommended at Discharge Intermittent Supervision/Assistance  Patient can return home with the following  Help with stairs or ramp for entrance;Assist for transportation;Assistance with cooking/housework    Functional Status Assessment  Patient has had a recent decline in their functional status and demonstrates the ability to make significant improvements in function in a reasonable and predictable amount of time.  Equipment Recommendations  None recommended by OT       Precautions / Restrictions Precautions Precautions: Fall Restrictions Weight Bearing Restrictions: No      Mobility Bed Mobility Overal bed mobility: Modified Independent             General bed mobility comments: no physical assistance provided    Transfers Overall transfer level: Needs assistance Equipment used: None Transfers: Sit to/from Stand, Bed to chair/wheelchair/BSC Sit to Stand: Supervision     Step pivot transfers: Supervision     General transfer comment: cuing for technique and management of lines      Balance Overall balance assessment: Mild deficits observed, not formally tested                                         ADL either performed or assessed with clinical judgement   ADL                                         General ADL Comments: Supervision overall for toileting and LB clothing management without physical assistance. Pt is very deconditioned and SOB with activities which seem to be his biggest barriers.  Vision Patient Visual Report: No change from baseline              Pertinent Vitals/Pain Pain Assessment Pain Assessment: No/denies pain     Hand Dominance Right   Extremity/Trunk Assessment Upper Extremity Assessment Upper Extremity Assessment: Overall WFL for tasks assessed;Generalized weakness   Lower Extremity Assessment Lower Extremity Assessment: Overall WFL for tasks assessed;Generalized weakness       Communication Communication Communication: No difficulties   Cognition Arousal/Alertness:  Awake/alert Behavior During Therapy: WFL for tasks assessed/performed Overall Cognitive Status: Within Functional Limits for tasks assessed                                                  Home Living Family/patient expects to be discharged to:: Private residence Living Arrangements: Parent (mother) Available Help at Discharge: Family;Available PRN/intermittently Type of Home: House Home Access: Stairs to enter [employer] of Steps: 3 Entrance Stairs-Rails: Right;Left Home Layout: Two level;Able to live on main level with bedroom/bathroom Alternate Level Stairs-Number of Steps: He has an apartment in the basement with full bath. 13 steps down to basement or can reach from outside. He reports he can also stay on main floor of home. Alternate Level Stairs-Rails: Right;Left;Can reach both Bathroom Shower/Tub: Walk-in shower         Home Equipment: Conservation officer, nature (2 wheels);Cane - single point   Additional Comments: unsure of location of equipment as he does not use      Prior Functioning/Environment Prior Level of Function : Independent/Modified Independent                        OT Problem List: Decreased strength;Cardiopulmonary status limiting activity;Decreased activity tolerance;Impaired balance (sitting and/or standing);Decreased knowledge of precautions      OT Treatment/Interventions: Self-care/ADL training;Therapeutic exercise;Therapeutic activities;Energy conservation;DME and/or AE instruction;Balance training;Manual therapy    OT Goals(Current goals can be found in the care plan section) Acute Rehab OT Goals Patient Stated Goal: to go home OT Goal Formulation: With patient Time For Goal Achievement: 01/05/22 Potential to Achieve Goals: Fair ADL Goals Pt Will Perform Grooming: standing;Independently Pt Will Perform Lower Body Dressing: Independently;sit to/from stand Pt Will Transfer to Toilet: Independently;ambulating Pt  Will Perform Toileting - Clothing Manipulation and hygiene: Independently;sit to/from stand  OT Frequency: Min 2X/week       AM-PAC OT "6 Clicks" Daily Activity     Outcome Measure Help from another person eating meals?: None Help from another person taking care of personal grooming?: None Help from another person toileting, which includes using toliet, bedpan, or urinal?: None Help from another person bathing (including washing, rinsing, drying)?: A Little Help from another person to put on and taking off regular upper body clothing?: None Help from another person to put on and taking off regular lower body clothing?: None 6 Click Score: 23   End of Session Equipment Utilized During Treatment: Oxygen (2Ls) Nurse Communication: Mobility status  Activity Tolerance: Patient tolerated treatment well;Patient limited by fatigue Patient left: in bed;with call bell/phone within reach;with bed alarm set  OT Visit Diagnosis: Unsteadiness on feet (R26.81);Muscle weakness (generalized) (M62.81)                Time: 3818-2993 OT Time Calculation (min): 20 min Charges:  OT General Charges $OT Visit: 1 Visit OT Evaluation $OT Eval Moderate  Complexity: 1 Mod OT Treatments $Self Care/Home Management : 8-22 mins  Darleen Crocker, MS, OTR/L , CBIS ascom [phone]  12/22/21, 11:27 AM

## 2021-12-22 NOTE — Progress Notes (Signed)
Trial off bipap. Patient only lasted 10 minutes before he started feeling short of breath. Placed back on bipap, decreased fio2. No further changes at this time.

## 2021-12-22 NOTE — Consult Note (Signed)
° °  Heart Failure Nurse Navigator Note  HFpEF 60-65% moderate left ventricular hypertrophy.  Grade 1 diastolic dysfunction.   He initially presented to the heart failure clinic with request for an urgent visit.  Felt like he was having difficulty talking due to his shortness of breath.  He was directed to the emergency room with O2 saturations on room air in the low 70s.  He had also noted PND.   Comorbidities:  Hypertension Hyperlipidemia Alcohol abuse Seizures  Labs:  Sodium 137, potassium 4.3, chloride 99, CO2 27, BUN 33, creatinine 0.67.  Lactic acid 1.3, BNP 209 Weight 91.9 kg down from 96.6 on admission Blood pressure 114/77  Medications:  Amlodipine 10 mg daily Carvedilol 25 mg 2 times a day with meals Folate acid 1 mg daily Hydralazine 50 mg 3 times a day Isosorbide mononitrate 30 mg daily Lisinopril 40 mg daily Crestor 20 mg daily Thiamine 100 mg daily  He is scheduled to start Lasix 40 mg 2 times a day IV.   Initial meeting with patient today, he was laying in bed remains on O2 per nasal cannula.  Also noted he remains short of breath with conversation.  Discussed heart failure and what it means.  Also discussed the function of his heart.    He states that he lives at home with his mother.  She is the 1 that prepares the meals.  He states that he does use salt at the table and that his mom also fixes ham and bacon.  Discussed the importance of removing the saltshaker from the table and not eating processed meats.  Also discussed fluid intake.  He admits to drinking 1 pint of vodka every day of the week along with 2-20 ounce [employer] daily.  He states that he is wanting to cut down to just drinking on the weekend.  Discussed that that is a place to start but it would be better for his heart if he would completely abstain and his alcohol is a toxin.  He states that he has a scale at home but that it needs a battery.  He does weigh himself at work.  Talked  about reporting 2 to 3 pound weight gain overnight or 5 pounds within the week.  He states that he works for 10-hour days a week in the [employer].  A scale was placed in his bathroom for him to start to weigh himself every morning after emptying his bladder and to record.  He was given the living with heart failure teaching booklet, zone magnet and information on low-sodium.  He has follow-up with the outpatient heart failure clinic on February 21 at 4 PM.  He has a 17% no-show ratio of which is 12 out of 71 appointments.  Pricilla Riffle RN [facility]

## 2021-12-22 NOTE — Progress Notes (Signed)
Nutrition Brief Note  Patient identified on the Malnutrition Screening Tool (MST) Report  Wt Readings from Last 15 Encounters:  12/22/21 91.1 kg  12/21/21 92.5 kg  12/08/21 96.6 kg  11/30/21 98 kg  11/30/21 100.2 kg  11/05/21 94.4 kg  11/04/21 96.6 kg  10/13/21 96.4 kg  09/01/21 96.4 kg  08/04/21 98 kg  06/28/21 98 kg  04/23/21 99 kg  03/22/21 99 kg  02/18/21 100.7 kg  09/07/20 96.5 kg   Jose Cedillos. is a 51 y.o. African-American male with medical history significant for HFpEF, hypertension, dyslipidemia alcohol abuse and seizures, who presented to the ER with acute onset of worsening dyspnea over the last couple of days with associated dry cough without wheezing.  He admits to rhinorrhea and nasal congestion.  The patient admits to 3 pillow orthopnea, dyspnea on exertion, as well as paroxysmal nocturnal dyspnea.  He denies any lower extremity edema.  No fever or chills but he has been diaphoretic.  No chest pain or palpitations.  He smokes cigar and quit smoking cigarettes 3 years ago.  No nausea or vomiting or abdominal pain.  No dysuria, oliguria or hematuria or flank pain.  Pt admitted with CHF exacerbation.  Reviewed I/O's: -250 ml x 24 hours  Case discussed with Heart Failure RN prior to visit, who reports pt has very poor insight about condition and would benefit from additional education.   Spoke with pt at bedside, who reports feeling better today. Noted pt consumed only applesauce and juice off breakfast tray. Pt shares that he is "not a breakfast eater". He works a 7-5 shift at a [employer] and reports he usually does not eat until after he gets home. Pt will often purchase take out food (chicken gyro, hot dog, or sandwich and fries). He lives with his mother, who cooks a meat, starch, and vegetable daily (chicken or pork chops, potatoes, and a vegetable); pt reports his mom cooks healthfully due to her history of DM. Of note, pt also consumes 1 L of vodka daily.   RD  spent most of the visit speaking with pt about CHF and self-management practices. He was able to teachback basic principles that Heart Failure RN spoke to pt about earlier. Discussed importance of low sodium diet, fluid restriction, and daily weight monitoring. Also reviewed healthier choices at his favorite restaurants.   RD provided "Low Sodium Nutrition Therapy" handout from the Academy of Nutrition and Dietetics. Reviewed patient's dietary recall. Provided examples on ways to decrease sodium intake in diet. Discouraged intake of processed foods and use of salt shaker. Encouraged fresh fruits and vegetables as well as whole grain sources of carbohydrates to maximize fiber intake.   RD discussed why it is important for patient to adhere to diet recommendations, and emphasized the role of fluids, foods to avoid, and importance of weighing self daily. Teach back method used.  Expect fair compliance.  Pt denies any weight loss. Pt has experienced a 5.6% wt loss over the past 2 months, which is not significant for time frame.   Nutrition-Focused physical exam completed. Findings are no fat depletion, no muscle depletion, and mild edema.    Medications reviewed and included lasix, folic acid, prednisone, and thiamine.   Labs reviewed.    Current diet order is Heart Heathy (liberalized to 2 gram sodium for wider variety of meal selections), patient is consuming approximately n/a% of meals at this time. Labs and medications reviewed.   No nutrition interventions warranted at  this time. If nutrition issues arise, please consult RD.   Loistine Chance, RD, LDN, [place] Registered Dietitian II Certified Diabetes Care and Education Specialist Please refer to [facility] for RD and/or RD on-call/weekend/after hours pager

## 2021-12-23 LAB — CBC
HCT: 34.1 % — ABNORMAL LOW (ref 39.0–52.0)
Hemoglobin: 11.1 g/dL — ABNORMAL LOW (ref 13.0–17.0)
MCH: 30.2 pg (ref 26.0–34.0)
MCHC: 32.6 g/dL (ref 30.0–36.0)
MCV: 92.9 fL (ref 80.0–100.0)
Platelets: 209 10*3/uL (ref 150–400)
RBC: 3.67 MIL/uL — ABNORMAL LOW (ref 4.22–5.81)
RDW: 13.8 % (ref 11.5–15.5)
WBC: 11.8 10*3/uL — ABNORMAL HIGH (ref 4.0–10.5)
nRBC: 0.6 % — ABNORMAL HIGH (ref 0.0–0.2)

## 2021-12-23 LAB — BASIC METABOLIC PANEL
Anion gap: 8 (ref 5–15)
BUN: 33 mg/dL — ABNORMAL HIGH (ref 6–20)
CO2: 31 mmol/L (ref 22–32)
Calcium: 8.8 mg/dL — ABNORMAL LOW (ref 8.9–10.3)
Chloride: 96 mmol/L — ABNORMAL LOW (ref 98–111)
Creatinine, Ser: 0.71 mg/dL (ref 0.61–1.24)
GFR, Estimated: >60 mL/min (ref >60)
Glucose, Bld: 120 mg/dL — ABNORMAL HIGH (ref 70–99)
Potassium: 3.8 mmol/L (ref 3.5–5.1)
Sodium: 135 mmol/L (ref 135–145)

## 2021-12-23 LAB — MAGNESIUM: Magnesium: 1.5 mg/dL — ABNORMAL LOW (ref 1.7–2.4)

## 2021-12-23 MED ORDER — MAGNESIUM SULFATE 2 GM/50ML IV SOLN
2.0000 g | Freq: Once | INTRAVENOUS | Status: AC
Start: 2021-12-23 — End: 2021-12-23
  Administered 2021-12-23: 2 g via INTRAVENOUS
  Filled 2021-12-23: qty 50

## 2021-12-23 NOTE — Progress Notes (Signed)
Gave patient Incentive spirometer.  Provided education.  Patient used teach back.  Achieved 1500-1625 ml.  Patient tolerated well and did not have any further questions

## 2021-12-23 NOTE — Progress Notes (Signed)
PROGRESS NOTE    Jose [name].  XQJ:[id number] DOB: 1970/12/10 DOA: 12/21/2021 PCP: Jon Billings, NP  [phone]   Assessment & Plan:   Principal Problem:   Acute CHF (congestive heart failure) (HCC)   Jose [name]. is a 51 y.o. African-American male with medical history significant for HFpEF, hypertension, alcohol abuse and seizures, who presented to the ER with worsening dyspnea over the last couple of days with associated dry cough.  He admits to rhinorrhea and nasal congestion.  The patient admits to 3 pillow orthopnea, dyspnea on exertion, as well as paroxysmal nocturnal dyspnea.   Acute hypoxemic respiratory failure --requiring BiPAP.  weaned down to 2L at rest, and 3L with walking. --started tx for both CHF and COPD exacerbation on admission. --Continue supplemental O2 to keep sats >=92%, wean as tolerated  1.  Acute on chronic HFpEF with subsequent  The patient last 2D echo on 11/05/2021 revealed an EF of 60 to 65% with grade 1 diastolic dysfunction --cont IV lasix 40 mg BID  2.  COPD acute exacerbation likely secondary to acute bronchitis --started on IV solumedrol, ceftriaxone and azithromycin, d/c'ed ceftriaxone Plan: --cont steroid --cont azithromycin --cont DuoNeb scheduled  3.  Essential hypertension. -on home Coreg, amlodipine, Zestril, hydralazine and Imdur. --cont home regimen --cont IV diuresis  4.  Alcohol abuse. --CIWA --cont folic acid and thiamine  6.  Seizure disorder. - We will continue Tegretol.  7.  Dyslipidemia. - cont home statin   DVT prophylaxis: Lovenox SQ Code Status: Full code  Family Communication:  Level of care: Progressive Dispo:   The patient is from: home Anticipated d/c is to: home Anticipated d/c date is: 1-2 days Patient currently is not medically ready to d/c due to: new hypoxia, on IV lasix   Subjective and Interval History:  Pt still had dyspnea and cough.  Needed 3L O2 with walking.  Worked with  PT.   Objective: Vitals:   12/23/21 1102 12/23/21 1123 12/23/21 1600 12/23/21 1649  BP:  112/79  120/86  Pulse:  73  80  Resp:  18  18  Temp:  98.1 F (36.7 C)  97.8 F (36.6 C)  TempSrc:      SpO2: 90% 92% 94% 98%  Weight:      Height:        Intake/Output Summary (Last 24 hours) at 12/23/2021 1851 Last data filed at 12/23/2021 1832 Gross per 24 hour  Intake 360 ml  Output 1825 ml  Net -1465 ml   Filed Weights   12/21/21 1620 12/22/21 0500  Weight: 96.6 kg 91.1 kg    Examination:   Constitutional: NAD, AAOx3 HEENT: conjunctivae and lids normal, EOMI CV: No cyanosis.   RESP: congested cough, on 3L SKIN: warm, dry Neuro: II - XII grossly intact.   Psych: Normal mood and affect.  Appropriate judgement and reason   Data Reviewed: I have personally reviewed following labs and imaging studies  CBC: Recent Labs  Lab 12/21/21 1639 12/22/21 0628 12/23/21 0412  WBC 12.7* 10.0 11.8*  NEUTROABS 9.1*  --   --   HGB 12.0* 12.1* 11.1*  HCT 37.0* 38.1* 34.1*  MCV 94.4 94.1 92.9  PLT 172 184 970   Basic Metabolic Panel: Recent Labs  Lab 12/22/21 0628 12/23/21 0412  NA 137 135  K 4.3 3.8  CL 99 96*  CO2 27 31  GLUCOSE 145* 120*  BUN 33* 33*  CREATININE 0.67 0.71  CALCIUM 8.9 8.8*  MG  --  1.5*   GFR: Estimated Creatinine Clearance: 122.5 mL/min (by C-G formula based on SCr of 0.71 mg/dL). Liver Function Tests: No results for input(s): AST, ALT, ALKPHOS, BILITOT, PROT, ALBUMIN in the last 168 hours. No results for input(s): LIPASE, AMYLASE in the last 168 hours. No results for input(s): AMMONIA in the last 168 hours. Coagulation Profile: No results for input(s): INR, PROTIME in the last 168 hours. Cardiac Enzymes: No results for input(s): CKTOTAL, CKMB, CKMBINDEX, TROPONINI in the last 168 hours. BNP (last 3 results) No results for input(s): PROBNP in the last 8760 hours. HbA1C: No results for input(s): HGBA1C in the last 72 hours. CBG: No results for  input(s): GLUCAP in the last 168 hours. Lipid Profile: No results for input(s): CHOL, HDL, LDLCALC, TRIG, CHOLHDL, LDLDIRECT in the last 72 hours. Thyroid Function Tests: No results for input(s): TSH, T4TOTAL, FREET4, T3FREE, THYROIDAB in the last 72 hours. Anemia Panel: No results for input(s): VITAMINB12, FOLATE, FERRITIN, TIBC, IRON, RETICCTPCT in the last 72 hours. Sepsis Labs: Recent Labs  Lab 12/21/21 1639 12/21/21 1739  PROCALCITON 0.23  --   LATICACIDVEN  --  1.3    Recent Results (from the past 240 hour(s))  Resp Panel by RT-PCR (Flu A&B, Covid) Nasopharyngeal Swab     Status: None   Collection Time: 12/21/21  4:41 PM   Specimen: Nasopharyngeal Swab; Nasopharyngeal(NP) swabs in vial transport medium  Result Value Ref Range Status   SARS Coronavirus 2 by RT PCR NEGATIVE NEGATIVE Final    Comment: (NOTE) SARS-CoV-2 target nucleic acids are NOT DETECTED.  The SARS-CoV-2 RNA is generally detectable in upper respiratory specimens during the acute phase of infection. The lowest concentration of SARS-CoV-2 viral copies this assay can detect is 138 copies/mL. A negative result does not preclude SARS-Cov-2 infection and should not be used as the sole basis for treatment or other patient management decisions. A negative result may occur with  improper specimen collection/handling, submission of specimen other than nasopharyngeal swab, presence of viral mutation(s) within the areas targeted by this assay, and inadequate number of viral copies(<138 copies/mL). A negative result must be combined with clinical observations, patient history, and epidemiological information. The expected result is Negative.  Fact Sheet for Patients:  EntrepreneurPulse.com.au  Fact Sheet for Healthcare Providers:  IncredibleEmployment.be  This test is no t yet approved or cleared by the [place] FDA and  has been authorized for detection and/or diagnosis of  SARS-CoV-2 by FDA under an Emergency Use Authorization (EUA). This EUA will remain  in effect (meaning this test can be used) for the duration of the COVID-19 declaration under Section 564(b)(1) of the Act, 21 U.S.C.section 360bbb-3(b)(1), unless the authorization is terminated  or revoked sooner.       Influenza A by PCR NEGATIVE NEGATIVE Final   Influenza B by PCR NEGATIVE NEGATIVE Final    Comment: (NOTE) The Xpert Xpress SARS-CoV-2/FLU/RSV plus assay is intended as an aid in the diagnosis of influenza from Nasopharyngeal swab specimens and should not be used as a sole basis for treatment. Nasal washings and aspirates are unacceptable for Xpert Xpress SARS-CoV-2/FLU/RSV testing.  Fact Sheet for Patients: EntrepreneurPulse.com.au  Fact Sheet for Healthcare Providers: IncredibleEmployment.be  This test is not yet approved or cleared by the [place] FDA and has been authorized for detection and/or diagnosis of SARS-CoV-2 by FDA under an Emergency Use Authorization (EUA). This EUA will remain in effect (meaning this test can be used) for the duration of the COVID-19 declaration under  Section 564(b)(1) of the Act, 21 U.S.C. section 360bbb-3(b)(1), unless the authorization is terminated or revoked.  Performed at [facility], [address] [place], [place] [address]   Culture, blood (Routine X 2) w Reflex to ID Panel     Status: None (Preliminary result)   Collection Time: 12/21/21  5:39 PM   Specimen: BLOOD  Result Value Ref Range Status   Specimen Description BLOOD RIGHT ASSIST CONTROL  Final   Special Requests   Final    BOTTLES DRAWN AEROBIC AND ANAEROBIC Blood Culture results may not be optimal due to an excessive volume of blood received in culture bottles   Culture   Final    NO GROWTH 2 DAYS Performed at [facility], [address], [place], [place] [address]    Report Status PENDING  Incomplete   Culture, blood (Routine X 2) w Reflex to ID Panel     Status: None (Preliminary result)   Collection Time: 12/21/21  5:39 PM   Specimen: BLOOD  Result Value Ref Range Status   Specimen Description BLOOD RIGHT FOREARM  Final   Special Requests   Final    BOTTLES DRAWN AEROBIC AND ANAEROBIC Blood Culture results may not be optimal due to an excessive volume of blood received in culture bottles   Culture   Final    NO GROWTH 2 DAYS Performed at [facility], [address] [place], [place] [address]    Report Status PENDING  Incomplete  Respiratory (~20 pathogens) panel by PCR     Status: None   Collection Time: 12/22/21 12:15 PM   Specimen: Nasopharyngeal Swab; Respiratory  Result Value Ref Range Status   Adenovirus NOT DETECTED NOT DETECTED Final   Coronavirus 229E NOT DETECTED NOT DETECTED Final    Comment: (NOTE) The Coronavirus on the Respiratory Panel, DOES NOT test for the novel  Coronavirus (2019 nCoV)    Coronavirus HKU1 NOT DETECTED NOT DETECTED Final   Coronavirus NL63 NOT DETECTED NOT DETECTED Final   Coronavirus OC43 NOT DETECTED NOT DETECTED Final   Metapneumovirus NOT DETECTED NOT DETECTED Final   Rhinovirus / Enterovirus NOT DETECTED NOT DETECTED Final   Influenza A NOT DETECTED NOT DETECTED Final   Influenza B NOT DETECTED NOT DETECTED Final   Parainfluenza Virus 1 NOT DETECTED NOT DETECTED Final   Parainfluenza Virus 2 NOT DETECTED NOT DETECTED Final   Parainfluenza Virus 3 NOT DETECTED NOT DETECTED Final   Parainfluenza Virus 4 NOT DETECTED NOT DETECTED Final   Respiratory Syncytial Virus NOT DETECTED NOT DETECTED Final   Bordetella pertussis NOT DETECTED NOT DETECTED Final   Bordetella Parapertussis NOT DETECTED NOT DETECTED Final   Chlamydophila pneumoniae NOT DETECTED NOT DETECTED Final   Mycoplasma pneumoniae NOT DETECTED NOT DETECTED Final    Comment: Performed at [facility], [place] [address] [place], [place] [address]      Radiology  Studies: No results found.   Scheduled Meds:  amLODipine  10 mg Oral Daily   azithromycin  500 mg Oral Daily   carbamazepine  200 mg Oral BID   carvedilol  25 mg Oral BID WC   enoxaparin (LOVENOX) injection  0.5 mg/kg Subcutaneous T01X   folic acid  1 mg Oral Daily   furosemide  40 mg Intravenous BID   hydrALAZINE  50 mg Oral TID   ipratropium-albuterol  3 mL Nebulization QID   isosorbide mononitrate  30 mg Oral Daily   lisinopril  40 mg Oral Daily  multivitamins with iron  1 tablet Oral Daily   predniSONE  40 mg Oral Q breakfast   rosuvastatin  20 mg Oral Daily   thiamine  100 mg Oral Daily   Continuous Infusions:   LOS: 2 days     Enzo Bi, MD [facility] If 7PM-7AM, please contact night-coverage 12/23/2021, 6:51 PM

## 2021-12-24 LAB — MAGNESIUM: Magnesium: 1.7 mg/dL (ref 1.7–2.4)

## 2021-12-24 LAB — BASIC METABOLIC PANEL
Anion gap: 9 (ref 5–15)
BUN: 31 mg/dL — ABNORMAL HIGH (ref 6–20)
CO2: 31 mmol/L (ref 22–32)
Calcium: 9.3 mg/dL (ref 8.9–10.3)
Chloride: 96 mmol/L — ABNORMAL LOW (ref 98–111)
Creatinine, Ser: 0.71 mg/dL (ref 0.61–1.24)
GFR, Estimated: >60 mL/min (ref >60)
Glucose, Bld: 105 mg/dL — ABNORMAL HIGH (ref 70–99)
Potassium: 3.5 mmol/L (ref 3.5–5.1)
Sodium: 136 mmol/L (ref 135–145)

## 2021-12-24 LAB — CBC
HCT: 35.5 % — ABNORMAL LOW (ref 39.0–52.0)
Hemoglobin: 11.4 g/dL — ABNORMAL LOW (ref 13.0–17.0)
MCH: 29.9 pg (ref 26.0–34.0)
MCHC: 32.1 g/dL (ref 30.0–36.0)
MCV: 93.2 fL (ref 80.0–100.0)
Platelets: 199 10*3/uL (ref 150–400)
RBC: 3.81 MIL/uL — ABNORMAL LOW (ref 4.22–5.81)
RDW: 13.9 % (ref 11.5–15.5)
WBC: 10.8 10*3/uL — ABNORMAL HIGH (ref 4.0–10.5)
nRBC: 0.2 % (ref 0.0–0.2)

## 2021-12-24 MED ORDER — IPRATROPIUM-ALBUTEROL 0.5-2.5 (3) MG/3ML IN SOLN
3.0000 mL | Freq: Three times a day (TID) | RESPIRATORY_TRACT | Status: DC
  Administered 2021-12-24 – 2021-12-25 (×4): 3 mL via RESPIRATORY_TRACT
  Filled 2021-12-24 (×4): qty 3

## 2021-12-24 NOTE — Progress Notes (Signed)
Occupational Therapy Treatment Patient Details Name: Jose [name]. MRN: [id number] DOB: [birth year] Today's Date: 12/24/2021   History of present illness 51 y.o. African-American male with medical history significant for HFpEF, hypertension, dyslipidemia alcohol abuse and seizures, who presented to the ER with acute onset of worsening dyspnea over the last couple of days with associated dry cough without wheezing.  He admits to rhinorrhea and nasal congestion.  The patient admits to 3 pillow orthopnea, dyspnea on exertion, as well as paroxysmal nocturnal dyspnea.  He denies any lower extremity edema.  No fever or chills but he has been diaphoretic.   OT comments  Upon entering the room, pt supine in bed with no c/o pain and agreeable to OT intervention. Pt is very concerned about possibly having to return home with oxygen. Pt dons pants and shoes without assistance and ambulates 400' on RA with O2 saturation dropping to 85%. Pt placed on 2Ls while ambulating and picking up items from floor for 200' with O2 remaining above 93%. Pt returning to room and remained on 2Ls. Pt is independent without use of AD and no LOB. OT encouraged pt to utilize incentive spirometer and continue functional mobility tasks for home. Pt with no further acute OT needs at this time. OT to SIGN OFF.    Recommendations for follow up therapy are one component of a multi-disciplinary discharge planning process, led by the attending physician.  Recommendations may be updated based on patient status, additional functional criteria and insurance authorization.    Follow Up Recommendations  No OT follow up    Assistance Recommended at Discharge PRN     Equipment Recommendations  None recommended by OT       Precautions / Restrictions Precautions Precautions: Fall       Mobility Bed Mobility Overal bed mobility: Independent                  Transfers Overall transfer level: Independent Equipment used:  None Transfers: Sit to/from Stand Sit to Stand: Independent     Step pivot transfers: Independent           Balance Overall balance assessment: No apparent balance deficits (not formally assessed)                                         ADL either performed or assessed with clinical judgement   ADL Overall ADL's : Modified independent                                       General ADL Comments: mod I to don pants and B shoes this session. Pt ambulates 700' without assistance and picking objects off of floor.    Extremity/Trunk Assessment Upper Extremity Assessment Upper Extremity Assessment: Overall WFL for tasks assessed   Lower Extremity Assessment Lower Extremity Assessment: Overall WFL for tasks assessed        Vision Patient Visual Report: No change from baseline            Cognition Arousal/Alertness: Awake/alert Behavior During Therapy: WFL for tasks assessed/performed Overall Cognitive Status: Within Functional Limits for tasks assessed  Pertinent Vitals/ Pain       Pain Assessment Pain Assessment: No/denies pain      Progress Toward Goals  OT Goals(current goals can now be found in the care plan section)  Progress towards OT goals: Goals met/education completed, patient discharged from OT  Acute Rehab OT Goals Patient Stated Goal: to get better an go home OT Goal Formulation: With patient Time For Goal Achievement: 01/05/22 Potential to Achieve Goals: [place] All goals met and education completed, patient discharged from [place] OT "6 Clicks" Daily Activity     Outcome Measure   Help from another person eating meals?: None Help from another person taking care of personal grooming?: None Help from another person toileting, which includes using toliet, bedpan, or urinal?: None Help from another person bathing  (including washing, rinsing, drying)?: None Help from another person to put on and taking off regular upper body clothing?: None Help from another person to put on and taking off regular lower body clothing?: None 6 Click Score: 24    End of Session Equipment Utilized During Treatment: Oxygen (2LS)  OT Visit Diagnosis: Unsteadiness on feet (R26.81);Muscle weakness (generalized) (M62.81)   Activity Tolerance Patient tolerated treatment well   Patient Left in bed;with call bell/phone within reach;with bed alarm set   Nurse Communication Mobility status        Time: 8377-9396 OT Time Calculation (min): 24 min  Charges: OT General Charges $OT Visit: 1 Visit OT Treatments $Self Care/Home Management : 8-22 mins $Therapeutic Activity: 8-22 mins  Darleen Crocker, MS, OTR/L , CBIS ascom [phone]  12/24/21, 1:31 PM

## 2021-12-24 NOTE — Progress Notes (Signed)
°  Transition of Care [facility]) Screening Note   Patient Details  Name: Francis Doenges. Date of Birth: January 30, 1971   Transition of Care [facility]) CM/SW Contact:    Alberteen Sam, LCSW Phone Number: 12/24/2021, 9:15 AM    Transition of Care Department [facility]) has reviewed patient and no TOC needs have been identified at this time. We will continue to monitor patient advancement through interdisciplinary progression rounds. If new patient transition needs arise, please place a TOC consult.  [place], [place]

## 2021-12-24 NOTE — Progress Notes (Signed)
PROGRESS NOTE    Jose [name].  DDU:[id number] DOB: 04-14-1971 DOA: 12/21/2021 PCP: Jon Billings, NP  [phone]   Assessment & Plan:   Principal Problem:   Acute CHF (congestive heart failure) (HCC)   Jose [name]. is a 51 y.o. African-American male with medical history significant for HFpEF, hypertension, alcohol abuse and seizures, who presented to the ER with worsening dyspnea over the last couple of days with associated dry cough.  He admits to rhinorrhea and nasal congestion.  The patient admits to 3 pillow orthopnea, dyspnea on exertion, as well as paroxysmal nocturnal dyspnea.   Acute hypoxemic respiratory failure --requiring BiPAP.  weaned down to 2L at rest, and 3L with walking. --started tx for both CHF and COPD exacerbation on admission. --Continue supplemental O2 to keep sats between 88-92%, wean as tolerated  1.  Acute on chronic HFpEF  The patient last 2D echo on 11/05/2021 revealed an EF of 60 to 65% with grade 1 diastolic dysfunction --CXR showed diffuse interstitial edema --cont IV lasix 40 mg BID  2.  COPD acute exacerbation likely secondary to acute bronchitis --started on IV solumedrol, ceftriaxone and azithromycin, d/c'ed ceftriaxone Plan: --cont prednisone 40 mg daily --cont azithromycin --cont DuoNeb scheduled  3.  Essential hypertension. -on home Coreg, amlodipine, Zestril, hydralazine and Imdur. --cont home regimen --cont IV lasix 40 mg BID  4.  Alcohol abuse. --no withdrawal symptoms so far --cont folic acid and thiamine  6.  Seizure disorder. --cont home Tegretol  7.  Dyslipidemia. - cont home statin   DVT prophylaxis: Lovenox SQ Code Status: Full code  Family Communication:  Level of care: Progressive Dispo:   The patient is from: home Anticipated d/c is to: home Anticipated d/c date is: likely tomorrow Patient currently is not medically ready to d/c due to: new hypoxia, on IV lasix   Subjective and Interval History:   Pt found to desat on RA, needed 2L at rest and at least 3L with walking.   Objective: Vitals:   12/24/21 0756 12/24/21 0757 12/24/21 1355 12/24/21 1612  BP:    105/72  Pulse:    75  Resp:    18  Temp:    97.8 F (36.6 C)  TempSrc:    Oral  SpO2: (!) 87% 92% 92% 97%  Weight:      Height:        Intake/Output Summary (Last 24 hours) at 12/24/2021 1723 Last data filed at 12/24/2021 1420 Gross per 24 hour  Intake 360 ml  Output 200 ml  Net 160 ml   Filed Weights   12/21/21 1620 12/22/21 0500 12/24/21 0456  Weight: 96.6 kg 91.1 kg 91 kg    Examination:   Constitutional: NAD, AAOx3 HEENT: conjunctivae and lids normal, EOMI CV: No cyanosis.   RESP: normal respiratory effort, on 2L Extremities: No effusions, edema in BLE SKIN: warm, dry Neuro: II - XII grossly intact.   Psych: Normal mood and affect.  Appropriate judgement and reason   Data Reviewed: I have personally reviewed following labs and imaging studies  CBC: Recent Labs  Lab 12/21/21 1639 12/22/21 0628 12/23/21 0412 12/24/21 0621  WBC 12.7* 10.0 11.8* 10.8*  NEUTROABS 9.1*  --   --   --   HGB 12.0* 12.1* 11.1* 11.4*  HCT 37.0* 38.1* 34.1* 35.5*  MCV 94.4 94.1 92.9 93.2  PLT 172 184 209 761   Basic Metabolic Panel: Recent Labs  Lab 12/22/21 0628 12/23/21 0412 12/24/21 0621  NA  137 135 136  K 4.3 3.8 3.5  CL 99 96* 96*  CO2 27 31 31   GLUCOSE 145* 120* 105*  BUN 33* 33* 31*  CREATININE 0.67 0.71 0.71  CALCIUM 8.9 8.8* 9.3  MG  --  1.5* 1.7   GFR: Estimated Creatinine Clearance: 122.5 mL/min (by C-G formula based on SCr of 0.71 mg/dL). Liver Function Tests: No results for input(s): AST, ALT, ALKPHOS, BILITOT, PROT, ALBUMIN in the last 168 hours. No results for input(s): LIPASE, AMYLASE in the last 168 hours. No results for input(s): AMMONIA in the last 168 hours. Coagulation Profile: No results for input(s): INR, PROTIME in the last 168 hours. Cardiac Enzymes: No results for input(s):  CKTOTAL, CKMB, CKMBINDEX, TROPONINI in the last 168 hours. BNP (last 3 results) No results for input(s): PROBNP in the last 8760 hours. HbA1C: No results for input(s): HGBA1C in the last 72 hours. CBG: No results for input(s): GLUCAP in the last 168 hours. Lipid Profile: No results for input(s): CHOL, HDL, LDLCALC, TRIG, CHOLHDL, LDLDIRECT in the last 72 hours. Thyroid Function Tests: No results for input(s): TSH, T4TOTAL, FREET4, T3FREE, THYROIDAB in the last 72 hours. Anemia Panel: No results for input(s): VITAMINB12, FOLATE, FERRITIN, TIBC, IRON, RETICCTPCT in the last 72 hours. Sepsis Labs: Recent Labs  Lab 12/21/21 1639 12/21/21 1739  PROCALCITON 0.23  --   LATICACIDVEN  --  1.3    Recent Results (from the past 240 hour(s))  Resp Panel by RT-PCR (Flu A&B, Covid) Nasopharyngeal Swab     Status: None   Collection Time: 12/21/21  4:41 PM   Specimen: Nasopharyngeal Swab; Nasopharyngeal(NP) swabs in vial transport medium  Result Value Ref Range Status   SARS Coronavirus 2 by RT PCR NEGATIVE NEGATIVE Final    Comment: (NOTE) SARS-CoV-2 target nucleic acids are NOT DETECTED.  The SARS-CoV-2 RNA is generally detectable in upper respiratory specimens during the acute phase of infection. The lowest concentration of SARS-CoV-2 viral copies this assay can detect is 138 copies/mL. A negative result does not preclude SARS-Cov-2 infection and should not be used as the sole basis for treatment or other patient management decisions. A negative result may occur with  improper specimen collection/handling, submission of specimen other than nasopharyngeal swab, presence of viral mutation(s) within the areas targeted by this assay, and inadequate number of viral copies(<138 copies/mL). A negative result must be combined with clinical observations, patient history, and epidemiological information. The expected result is Negative.  Fact Sheet for Patients:   EntrepreneurPulse.com.au  Fact Sheet for Healthcare Providers:  IncredibleEmployment.be  This test is no t yet approved or cleared by the [place] FDA and  has been authorized for detection and/or diagnosis of SARS-CoV-2 by FDA under an Emergency Use Authorization (EUA). This EUA will remain  in effect (meaning this test can be used) for the duration of the COVID-19 declaration under Section 564(b)(1) of the Act, 21 U.S.C.section 360bbb-3(b)(1), unless the authorization is terminated  or revoked sooner.       Influenza A by PCR NEGATIVE NEGATIVE Final   Influenza B by PCR NEGATIVE NEGATIVE Final    Comment: (NOTE) The Xpert Xpress SARS-CoV-2/FLU/RSV plus assay is intended as an aid in the diagnosis of influenza from Nasopharyngeal swab specimens and should not be used as a sole basis for treatment. Nasal washings and aspirates are unacceptable for Xpert Xpress SARS-CoV-2/FLU/RSV testing.  Fact Sheet for Patients: EntrepreneurPulse.com.au  Fact Sheet for Healthcare Providers: IncredibleEmployment.be  This test is not yet approved or  cleared by the [place] and has been authorized for detection and/or diagnosis of SARS-CoV-2 by FDA under an Emergency Use Authorization (EUA). This EUA will remain in effect (meaning this test can be used) for the duration of the COVID-19 declaration under Section 564(b)(1) of the Act, 21 U.S.C. section 360bbb-3(b)(1), unless the authorization is terminated or revoked.  Performed at [facility], [address] [place], [place] [address]   Culture, blood (Routine X 2) w Reflex to ID Panel     Status: None (Preliminary result)   Collection Time: 12/21/21  5:39 PM   Specimen: BLOOD  Result Value Ref Range Status   Specimen Description BLOOD RIGHT ASSIST CONTROL  Final   Special Requests   Final    BOTTLES DRAWN AEROBIC AND ANAEROBIC Blood Culture  results may not be optimal due to an excessive volume of blood received in culture bottles   Culture   Final    NO GROWTH 3 DAYS Performed at [facility], [address] [place], [place] [address]    Report Status PENDING  Incomplete  Culture, blood (Routine X 2) w Reflex to ID Panel     Status: None (Preliminary result)   Collection Time: 12/21/21  5:39 PM   Specimen: BLOOD  Result Value Ref Range Status   Specimen Description BLOOD RIGHT FOREARM  Final   Special Requests   Final    BOTTLES DRAWN AEROBIC AND ANAEROBIC Blood Culture results may not be optimal due to an excessive volume of blood received in culture bottles   Culture   Final    NO GROWTH 3 DAYS Performed at [facility], [address] [place], [place] [address]    Report Status PENDING  Incomplete  Respiratory (~20 pathogens) panel by PCR     Status: None   Collection Time: 12/22/21 12:15 PM   Specimen: Nasopharyngeal Swab; Respiratory  Result Value Ref Range Status   Adenovirus NOT DETECTED NOT DETECTED Final   Coronavirus 229E NOT DETECTED NOT DETECTED Final    Comment: (NOTE) The Coronavirus on the Respiratory Panel, DOES NOT test for the novel  Coronavirus (2019 nCoV)    Coronavirus HKU1 NOT DETECTED NOT DETECTED Final   Coronavirus NL63 NOT DETECTED NOT DETECTED Final   Coronavirus OC43 NOT DETECTED NOT DETECTED Final   Metapneumovirus NOT DETECTED NOT DETECTED Final   Rhinovirus / Enterovirus NOT DETECTED NOT DETECTED Final   Influenza A NOT DETECTED NOT DETECTED Final   Influenza B NOT DETECTED NOT DETECTED Final   Parainfluenza Virus 1 NOT DETECTED NOT DETECTED Final   Parainfluenza Virus 2 NOT DETECTED NOT DETECTED Final   Parainfluenza Virus 3 NOT DETECTED NOT DETECTED Final   Parainfluenza Virus 4 NOT DETECTED NOT DETECTED Final   Respiratory Syncytial Virus NOT DETECTED NOT DETECTED Final   Bordetella pertussis NOT DETECTED NOT DETECTED Final   Bordetella Parapertussis NOT  DETECTED NOT DETECTED Final   Chlamydophila pneumoniae NOT DETECTED NOT DETECTED Final   Mycoplasma pneumoniae NOT DETECTED NOT DETECTED Final    Comment: Performed at [facility], [facility] [address], [place], [place] [address]      Radiology Studies: No results found.   Scheduled Meds:  amLODipine  10 mg Oral Daily   azithromycin  500 mg Oral Daily   carbamazepine  200 mg Oral BID   carvedilol  25 mg Oral BID WC   enoxaparin (LOVENOX) injection  0.5 mg/kg Subcutaneous H60V   folic acid  1  mg Oral Daily   furosemide  40 mg Intravenous BID   hydrALAZINE  50 mg Oral TID   ipratropium-albuterol  3 mL Nebulization TID   isosorbide mononitrate  30 mg Oral Daily   lisinopril  40 mg Oral Daily   multivitamins with iron  1 tablet Oral Daily   predniSONE  40 mg Oral Q breakfast   rosuvastatin  20 mg Oral Daily   thiamine  100 mg Oral Daily   Continuous Infusions:   LOS: 3 days     Enzo Bi, MD [facility] If 7PM-7AM, please contact night-coverage 12/24/2021, 5:23 PM

## 2021-12-25 LAB — CBC
HCT: 37.3 % — ABNORMAL LOW (ref 39.0–52.0)
Hemoglobin: 12.1 g/dL — ABNORMAL LOW (ref 13.0–17.0)
MCH: 29.7 pg (ref 26.0–34.0)
MCHC: 32.4 g/dL (ref 30.0–36.0)
MCV: 91.4 fL (ref 80.0–100.0)
Platelets: 209 10*3/uL (ref 150–400)
RBC: 4.08 MIL/uL — ABNORMAL LOW (ref 4.22–5.81)
RDW: 13.6 % (ref 11.5–15.5)
WBC: 10.3 10*3/uL (ref 4.0–10.5)
nRBC: 0 % (ref 0.0–0.2)

## 2021-12-25 LAB — BASIC METABOLIC PANEL
Anion gap: 10 (ref 5–15)
BUN: 27 mg/dL — ABNORMAL HIGH (ref 6–20)
CO2: 32 mmol/L (ref 22–32)
Calcium: 9.5 mg/dL (ref 8.9–10.3)
Chloride: 94 mmol/L — ABNORMAL LOW (ref 98–111)
Creatinine, Ser: 0.65 mg/dL (ref 0.61–1.24)
GFR, Estimated: >60 mL/min (ref >60)
Glucose, Bld: 107 mg/dL — ABNORMAL HIGH (ref 70–99)
Potassium: 3.6 mmol/L (ref 3.5–5.1)
Sodium: 136 mmol/L (ref 135–145)

## 2021-12-25 LAB — MAGNESIUM: Magnesium: 1.6 mg/dL — ABNORMAL LOW (ref 1.7–2.4)

## 2021-12-25 MED ORDER — FOLIC ACID 1 MG PO TABS: 1.0000 mg | ORAL_TABLET | Freq: Every day | ORAL | Status: DC

## 2021-12-25 MED ORDER — FUROSEMIDE 40 MG PO TABS: 40.0000 mg | ORAL_TABLET | Freq: Every day | ORAL | 2 refills | Status: DC

## 2021-12-25 NOTE — Progress Notes (Addendum)
SATURATION QUALIFICATIONS: (This note is used to comply with regulatory documentation for home oxygen)  Patient Saturations on Room Air at Rest = 94%  Patient Saturations on Room Air while Ambulating = 88%  Patient Saturations on 2 Liters of oxygen while Ambulating = 94%

## 2021-12-25 NOTE — Progress Notes (Signed)
Mobility Specialist - Progress Note   12/25/21 1100  Mobility  Activity Ambulated independently in hallway;Stood at bedside;Dangled on edge of bed  Level of Assistance Independent  Assistive Device None  Distance Ambulated (ft) 400 ft  Activity Response Tolerated well  $Mobility charge 1 Mobility   SpO2 at rest with RA: 92% SpO2 ambulating with RA: 88% SpO2 post mobility with RA: 88%  Pt EOB utilizing 2L upon arrival. Pt STS + ambulated independently, O2 stats above. Complained of mild pain in right knee. Pt denied chest pain and SOB. Pt returned EOB with needs in reach. RN notified.   Merrily Brittle Mobility Specialist 12/25/21, 11:25 AM

## 2021-12-25 NOTE — Progress Notes (Signed)
Patient is currently not on telly.  On 2L Nasal Cannula.Will be weaned off to room air prior to discharge.

## 2021-12-25 NOTE — Plan of Care (Signed)
Problem: Education: Goal: Ability to demonstrate management of disease process will improve Outcome: Completed/Met Goal: Ability to verbalize understanding of medication therapies will improve Outcome: Completed/Met Goal: Individualized Educational Video(s) Outcome: Completed/Met   Problem: Activity: Goal: Capacity to carry out activities will improve Outcome: Completed/Met   Problem: Cardiac: Goal: Ability to achieve and maintain adequate cardiopulmonary perfusion will improve Outcome: Completed/Met   Problem: Education: Goal: Knowledge of General Education information will improve Description: Including pain rating scale, medication(s)/side effects and non-pharmacologic comfort measures Outcome: Completed/Met   Problem: Health Behavior/Discharge Planning: Goal: Ability to manage health-related needs will improve Outcome: Completed/Met   Problem: Clinical Measurements: Goal: Ability to maintain clinical measurements within normal limits will improve Outcome: Completed/Met Goal: Will remain free from infection Outcome: Completed/Met Goal: Diagnostic test results will improve Outcome: Completed/Met Goal: Respiratory complications will improve Outcome: Completed/Met Goal: Cardiovascular complication will be avoided Outcome: Completed/Met   Problem: Activity: Goal: Risk for activity intolerance will decrease Outcome: Completed/Met   Problem: Nutrition: Goal: Adequate nutrition will be maintained Outcome: Completed/Met   Problem: Coping: Goal: Level of anxiety will decrease Outcome: Completed/Met   Problem: Elimination: Goal: Will not experience complications related to bowel motility Outcome: Completed/Met Goal: Will not experience complications related to urinary retention Outcome: Completed/Met   Problem: Pain Managment: Goal: General experience of comfort will improve Outcome: Completed/Met   Problem: Safety: Goal: Ability to remain free from injury will  improve Outcome: Completed/Met   Problem: Skin Integrity: Goal: Risk for impaired skin integrity will decrease Outcome: Completed/Met   

## 2021-12-25 NOTE — Progress Notes (Signed)
Pt discharged.  PIV x2 removed, sites WNL.  All questions addressed.  Family member with pt at time of discharge.  Pt left via private vehicle.  Oxygen and oxygen tank with pt at time of discharge.

## 2021-12-25 NOTE — Discharge Summary (Signed)
Physician Discharge Summary   Jose [name].  male DOB: [birth year]  ESP:[id number]  PCP: Jon Billings, NP  Admit date: 12/21/2021 Discharge date: 12/25/2021  Admitted From: home Disposition:  home CODE STATUS: Full code  Discharge Instructions     Discharge instructions   Complete by: As directed    You have been treated with CHF and COPD flare ups.  You are discharged on 2 liters of supplemental oxygen which you currently need with walking.  Please follow up with your PCP to see when you can get off of supplemental oxygen, which I expect should be within 1-2 weeks.  I have increased your fluid pill Lasix from 20 mg to 40 mg daily, prescription sent.  Please follow up with your PCP to make sure your kidney function and electrolytes are stable on this new dose.  You have refills on all your other medications.   Dr. Enzo Bi [facility] Course:  For full details, please see H&P, progress notes, consult notes and ancillary notes.  Briefly,  Jose Nilsson. is a 51 y.o. African-American male with medical history significant for HFpEF, hypertension, alcohol abuse and seizures, who presented to the ER with worsening dyspnea over the last couple of days with associated dry cough.  He admits to rhinorrhea and nasal congestion.  The patient admits to 3 pillow orthopnea, dyspnea on exertion, as well as paroxysmal nocturnal dyspnea.   Acute hypoxemic respiratory failure --requiring BiPAP initially --was treated for both CHF and COPD exacerbation on admission. --prior to discharge, pt was sating well on room air at rest, however, dropped to 88% with walking, so pt was discharged on 2L home O2 and advised to follow up with PCP to see when he can get off of supplemental oxygen.  Acute on chronic HFpEF  The patient last 2D echo on 11/05/2021 revealed an EF of 60 to 65% with grade 1 diastolic dysfunction. --CXR showed diffuse interstitial edema --Pt received IV lasix 40 mg  BID during hospitalization. --Pt was discharged on oral lasix 40 mg daily (increased from 20 mg daily PTA).  COPD acute exacerbation likely secondary to acute bronchitis --started on IV solumedrol, ceftriaxone and azithromycin on admission, ceftriaxone since d/c'ed. --Pt completed steroid burst with prednisone 40 mg daily during hospitalization. --Pt completed a course of azithromycin  Essential hypertension. cont home Coreg, amlodipine, Zestril, hydralazine and Imdur. --Pt was discharged on oral lasix 40 mg daily (increased from 20 mg daily PTA).  Alcohol abuse. --no withdrawal symptoms so far --cont folic acid and thiamine  Seizure disorder. --pt was not taking Tegretol PTA  Dyslipidemia. - cont home statin   Discharge Diagnoses:  Principal Problem:   Acute CHF (congestive heart failure) ([place])   30 Day Unplanned Readmission Risk Score    Flowsheet Row ED to Hosp-Admission (Current) from 12/21/2021 in [facility]  30 Day Unplanned Readmission Risk Score (%) 13.03 Filed at 12/25/2021 0400       This score is the patient's risk of an unplanned readmission within 30 days of being discharged (0 -100%). The score is based on dignosis, age, lab data, medications, orders, and past utilization.   Low:  0-14.9   Medium: 15-21.9   High: 22-29.9   Extreme: 30 and above         Discharge Instructions:  Allergies as of 12/25/2021   No Known Allergies      Medication List  STOP taking these medications    carbamazepine 200 MG tablet Commonly known as: TEGretol   celecoxib 200 MG capsule Commonly known as: CELEBREX   famotidine 20 MG tablet Commonly known as: PEPCID   ondansetron 4 MG disintegrating tablet Commonly known as: ZOFRAN-ODT   potassium chloride SA 20 MEQ tablet Commonly known as: KLOR-CON M       TAKE these medications    albuterol 108 (90 Base) MCG/ACT inhaler Commonly known as: VENTOLIN HFA Inhale 2 puffs into the  lungs every 6 (six) hours as needed for wheezing or shortness of breath.   amLODipine 10 MG tablet Commonly known as: NORVASC Take 1 tablet (10 mg total) by mouth daily.   carvedilol 25 MG tablet Commonly known as: COREG Take 1 tablet (25 mg total) by mouth 2 (two) times daily with a meal.   folic acid 1 MG tablet Commonly known as: FOLVITE Take 1 tablet (1 mg total) by mouth daily.   furosemide 40 MG tablet Commonly known as: LASIX Take 1 tablet (40 mg total) by mouth daily. Increased from 20 mg daily. What changed:  medication strength how much to take additional instructions   hydrALAZINE 50 MG tablet Commonly known as: APRESOLINE Take 1 tablet (50 mg total) by mouth 3 (three) times daily.   isosorbide mononitrate 30 MG 24 hr tablet Commonly known as: IMDUR Take 1 tablet (30 mg total) by mouth daily.   lisinopril 40 MG tablet Commonly known as: ZESTRIL Take 1 tablet (40 mg total) by mouth daily.   rosuvastatin 20 MG tablet Commonly known as: Crestor Take 1 tablet (20 mg total) by mouth daily.   thiamine 100 MG tablet Take 1 tablet (100 mg total) by mouth daily.               Durable Medical Equipment  (From admission, onward)           Start     Ordered   12/25/21 1316  For home use only DME oxygen  Once       Question Answer Comment  Length of Need 6 Months   Mode or (Route) Nasal cannula   Liters per Minute 2   Frequency Continuous (stationary and portable oxygen unit needed)   Oxygen delivery system Gas      12/25/21 1316             Follow-up Information     Jon Billings, NP Follow up in 1 week(s).   Specialty: Nurse Practitioner Contact information: [address] [place] [address] [phone]                 No Known Allergies   The results of significant diagnostics from this hospitalization (including imaging, microbiology, ancillary and laboratory) are listed below for reference.    Consultations:   Procedures/Studies: DG Chest 2 View  Result Date: 12/21/2021 CLINICAL DATA:  Shortness of breath, CHF EXAM: CHEST - 2 VIEW COMPARISON:  11/30/2021 FINDINGS: Frontal and lateral views of the chest demonstrate an unremarkable cardiac silhouette. There is persistent central vascular congestion, with diffuse increased interstitial prominence compatible with interstitial edema. No effusion or pneumothorax. No acute bony abnormalities. IMPRESSION: 1. Diffuse interstitial edema. Electronically Signed   By: Randa Ngo M.D.   On: 12/21/2021 17:05   DG Chest Portable 1 View  Result Date: 11/30/2021 CLINICAL DATA:  Weakness.  History of congestive heart failure. EXAM: PORTABLE CHEST 1 VIEW COMPARISON:  05/16/2020 FINDINGS: Artifact overlies the chest. Heart size  is upper limits of normal. There is aortic atherosclerotic calcification. There may be pulmonary venous hypertension but there is no frank edema. No infiltrate, effusion or collapse. No acute bone finding. IMPRESSION: Pulmonary venous hypertension without frank edema. Heart size upper limits of normal. Aortic atherosclerosis. Electronically Signed   By: Nelson Chimes M.D.   On: 11/30/2021 10:17      Labs: BNP (last 3 results) Recent Labs    12/01/21 1404 12/21/21 1639  BNP 61.2 466.5*   Basic Metabolic Panel: Recent Labs  Lab 12/22/21 0628 12/23/21 0412 12/24/21 0621 12/25/21 0640  NA 137 135 136 136  K 4.3 3.8 3.5 3.6  CL 99 96* 96* 94*  CO2 27 31 31  32  GLUCOSE 145* 120* 105* 107*  BUN 33* 33* 31* 27*  CREATININE 0.67 0.71 0.71 0.65  CALCIUM 8.9 8.8* 9.3 9.5  MG  --  1.5* 1.7 1.6*   Liver Function Tests: No results for input(s): AST, ALT, ALKPHOS, BILITOT, PROT, ALBUMIN in the last 168 hours. No results for input(s): LIPASE, AMYLASE in the last 168 hours. No results for input(s): AMMONIA in the last 168 hours. CBC: Recent Labs  Lab 12/21/21 1639 12/22/21 0628 12/23/21 0412 12/24/21 0621  12/25/21 0640  WBC 12.7* 10.0 11.8* 10.8* 10.3  NEUTROABS 9.1*  --   --   --   --   HGB 12.0* 12.1* 11.1* 11.4* 12.1*  HCT 37.0* 38.1* 34.1* 35.5* 37.3*  MCV 94.4 94.1 92.9 93.2 91.4  PLT 172 184 209 199 209   Cardiac Enzymes: No results for input(s): CKTOTAL, CKMB, CKMBINDEX, TROPONINI in the last 168 hours. BNP: Invalid input(s): POCBNP CBG: No results for input(s): GLUCAP in the last 168 hours. D-Dimer No results for input(s): DDIMER in the last 72 hours. Hgb A1c No results for input(s): HGBA1C in the last 72 hours. Lipid Profile No results for input(s): CHOL, HDL, LDLCALC, TRIG, CHOLHDL, LDLDIRECT in the last 72 hours. Thyroid function studies No results for input(s): TSH, T4TOTAL, T3FREE, THYROIDAB in the last 72 hours.  Invalid input(s): FREET3 Anemia work up No results for input(s): VITAMINB12, FOLATE, FERRITIN, TIBC, IRON, RETICCTPCT in the last 72 hours. Urinalysis    Component Value Date/Time   COLORURINE YELLOW (A) 01/12/2018 1118   APPEARANCEUR Clear 05/12/2020 0830   LABSPEC 1.015 01/12/2018 1118   PHURINE 6.0 01/12/2018 1118   GLUCOSEU Negative 05/12/2020 0830   HGBUR NEGATIVE 01/12/2018 1118   BILIRUBINUR Negative 05/12/2020 0830   KETONESUR NEGATIVE 01/12/2018 1118   PROTEINUR 3+ (A) 05/12/2020 0830   PROTEINUR NEGATIVE 01/12/2018 1118   NITRITE Negative 05/12/2020 0830   NITRITE NEGATIVE 01/12/2018 1118   LEUKOCYTESUR Negative 05/12/2020 0830   Sepsis Labs Invalid input(s): PROCALCITONIN,  WBC,  LACTICIDVEN Microbiology Recent Results (from the past 240 hour(s))  Resp Panel by RT-PCR (Flu A&B, Covid) Nasopharyngeal Swab     Status: None   Collection Time: 12/21/21  4:41 PM   Specimen: Nasopharyngeal Swab; Nasopharyngeal(NP) swabs in vial transport medium  Result Value Ref Range Status   SARS Coronavirus 2 by RT PCR NEGATIVE NEGATIVE Final    Comment: (NOTE) SARS-CoV-2 target nucleic acids are NOT DETECTED.  The SARS-CoV-2 RNA is generally  detectable in upper respiratory specimens during the acute phase of infection. The lowest concentration of SARS-CoV-2 viral copies this assay can detect is 138 copies/mL. A negative result does not preclude SARS-Cov-2 infection and should not be used as the sole basis for treatment or other patient management decisions. A  negative result may occur with  improper specimen collection/handling, submission of specimen other than nasopharyngeal swab, presence of viral mutation(s) within the areas targeted by this assay, and inadequate number of viral copies(<138 copies/mL). A negative result must be combined with clinical observations, patient history, and epidemiological information. The expected result is Negative.  Fact Sheet for Patients:  EntrepreneurPulse.com.au  Fact Sheet for Healthcare Providers:  IncredibleEmployment.be  This test is no t yet approved or cleared by the [place] FDA and  has been authorized for detection and/or diagnosis of SARS-CoV-2 by FDA under an Emergency Use Authorization (EUA). This EUA will remain  in effect (meaning this test can be used) for the duration of the COVID-19 declaration under Section 564(b)(1) of the Act, 21 U.S.C.section 360bbb-3(b)(1), unless the authorization is terminated  or revoked sooner.       Influenza A by PCR NEGATIVE NEGATIVE Final   Influenza B by PCR NEGATIVE NEGATIVE Final    Comment: (NOTE) The Xpert Xpress SARS-CoV-2/FLU/RSV plus assay is intended as an aid in the diagnosis of influenza from Nasopharyngeal swab specimens and should not be used as a sole basis for treatment. Nasal washings and aspirates are unacceptable for Xpert Xpress SARS-CoV-2/FLU/RSV testing.  Fact Sheet for Patients: EntrepreneurPulse.com.au  Fact Sheet for Healthcare Providers: IncredibleEmployment.be  This test is not yet approved or cleared by the [place] FDA  and has been authorized for detection and/or diagnosis of SARS-CoV-2 by FDA under an Emergency Use Authorization (EUA). This EUA will remain in effect (meaning this test can be used) for the duration of the COVID-19 declaration under Section 564(b)(1) of the Act, 21 U.S.C. section 360bbb-3(b)(1), unless the authorization is terminated or revoked.  Performed at [facility], [address] [place], [place] [address]   Culture, blood (Routine X 2) w Reflex to ID Panel     Status: None (Preliminary result)   Collection Time: 12/21/21  5:39 PM   Specimen: BLOOD  Result Value Ref Range Status   Specimen Description BLOOD RIGHT ASSIST CONTROL  Final   Special Requests   Final    BOTTLES DRAWN AEROBIC AND ANAEROBIC Blood Culture results may not be optimal due to an excessive volume of blood received in culture bottles   Culture   Final    NO GROWTH 4 DAYS Performed at [facility], [address] [place], [place] [address]    Report Status PENDING  Incomplete  Culture, blood (Routine X 2) w Reflex to ID Panel     Status: None (Preliminary result)   Collection Time: 12/21/21  5:39 PM   Specimen: BLOOD  Result Value Ref Range Status   Specimen Description BLOOD RIGHT FOREARM  Final   Special Requests   Final    BOTTLES DRAWN AEROBIC AND ANAEROBIC Blood Culture results may not be optimal due to an excessive volume of blood received in culture bottles   Culture   Final    NO GROWTH 4 DAYS Performed at [facility], [address], [place], [place] [address]    Report Status PENDING  Incomplete  Respiratory (~20 pathogens) panel by PCR     Status: None   Collection Time: 12/22/21 12:15 PM   Specimen: Nasopharyngeal Swab; Respiratory  Result Value Ref Range Status   Adenovirus NOT DETECTED NOT DETECTED Final   Coronavirus 229E NOT DETECTED NOT DETECTED Final    Comment: (NOTE) The Coronavirus on the Respiratory Panel, DOES NOT test for the novel   Coronavirus (2019  nCoV)    Coronavirus HKU1 NOT DETECTED NOT DETECTED Final   Coronavirus NL63 NOT DETECTED NOT DETECTED Final   Coronavirus OC43 NOT DETECTED NOT DETECTED Final   Metapneumovirus NOT DETECTED NOT DETECTED Final   Rhinovirus / Enterovirus NOT DETECTED NOT DETECTED Final   Influenza A NOT DETECTED NOT DETECTED Final   Influenza B NOT DETECTED NOT DETECTED Final   Parainfluenza Virus 1 NOT DETECTED NOT DETECTED Final   Parainfluenza Virus 2 NOT DETECTED NOT DETECTED Final   Parainfluenza Virus 3 NOT DETECTED NOT DETECTED Final   Parainfluenza Virus 4 NOT DETECTED NOT DETECTED Final   Respiratory Syncytial Virus NOT DETECTED NOT DETECTED Final   Bordetella pertussis NOT DETECTED NOT DETECTED Final   Bordetella Parapertussis NOT DETECTED NOT DETECTED Final   Chlamydophila pneumoniae NOT DETECTED NOT DETECTED Final   Mycoplasma pneumoniae NOT DETECTED NOT DETECTED Final    Comment: Performed at [facility], [place] [address], [place], [place] [address]     Total time spend on discharging this patient, including the last patient exam, discussing the hospital stay, instructions for ongoing care as it relates to all pertinent caregivers, as well as preparing the medical discharge records, prescriptions, and/or referrals as applicable, is 45 minutes.    Enzo Bi, MD  [facility] 12/25/2021, 1:17 PM

## 2021-12-25 NOTE — TOC Transition Note (Signed)
Transition of Care [facility]) - CM/SW Discharge Note   Patient Details  Name: Cassey Hurrell. MRN: [id number] Date of Birth: February 13, 1971  Transition of Care [facility]) CM/SW Contact:  Harriet Masson, RN Phone Number: 12/25/2021, 11:32 AM   Clinical Narrative:    Recommended home O2. Pt agreed to [employer] for set up. Called referral into [place] with [employer] for portable deliver and home set up. No other needs at this time. Team is aware.  Final next level of care: Home/Self Care Barriers to Discharge: Barriers Resolved   Patient Goals and CMS Choice   CMS Medicare.gov Compare Post Acute Care list provided to:: Patient Choice offered to / list presented to : Patient  Discharge Placement                    Patient and family notified of of transfer: 12/25/21  Discharge Plan and Services                DME Arranged: Oxygen   Date DME Agency Contacted: 12/25/21 Time DME Agency Contacted: [phone] Representative spoke with at DME Agency: [place] ([place]) Interventions     Readmission Risk Interventions No flowsheet data found.

## 2021-12-26 LAB — CULTURE, BLOOD (ROUTINE X 2)
Culture: NO GROWTH
Culture: NO GROWTH

## 2021-12-27 ENCOUNTER — Telehealth: Payer: Self-pay | Admitting: *Deleted

## 2021-12-27 DIAGNOSIS — D500 Iron deficiency anemia secondary to blood loss (chronic): Secondary | ICD-10-CM | POA: Diagnosis not present

## 2021-12-27 DIAGNOSIS — I5032 Chronic diastolic (congestive) heart failure: Secondary | ICD-10-CM | POA: Diagnosis not present

## 2021-12-27 DIAGNOSIS — I509 Heart failure, unspecified: Secondary | ICD-10-CM | POA: Diagnosis not present

## 2021-12-27 DIAGNOSIS — F10920 Alcohol use, unspecified with intoxication, uncomplicated: Secondary | ICD-10-CM | POA: Diagnosis not present

## 2021-12-27 NOTE — Progress Notes (Signed)
Patient ID: Jose [name]., male    DOB: Sep 17, 1971, 51 y.o.   MRN: [id number]  Mr Thilges is a 51 y/o male with a history of hyperlipidemia, HTN, current tobacco/ alcohol use and chronic heart failure.   Echo report from 11/05/21 reviewed and showed an EF of 60-65% along with moderate LVH. Echo report from 01/29/2019 reviewed and showed an EF of 60-65%  Admitted 12/21/21 due to SOB due to COPD/HF exacerbations. Initially needed bipap. Weaned off to oxygen upon exertion. Initially given IV lasix with transition to oral diuretics. Given IV solumedrol and antiibiotics. Discharged after 4 days. Was in the ED 11/30/21 due to fatigue, weakness, chills and body aches. Workup unremarkable and he was released. Was in the ED 11/04/21 due to seizures due to alcohol use and being out of seizure medication for a couple of days while visiting family. Head CT negative for acute process. Given IV ativan for signs of alcohol withdrawal and admission offered. Patient declined admission and says that he's not interested in stopping drinking. Released with neurology referral.   He presents today for a follow-up visit with a chief complaint of minimal shortness of breath upon moderate exertion. He describes this as chronic in nature and is much better since his recent admission. He has associated fatigue, cough, difficulty sleeping & tremors along with this. He denies any dizziness, abdominal distention, palpitations, pedal edema, chest pain or weight gain.   Overall, feels much better than the last time he was here.   Past Medical History:  Diagnosis Date   Alcohol abuse    Cervicalgia    CHF (congestive heart failure) ([facility])    Hyperlipidemia    Hypertension    Seizures ([place])    Past Surgical History:  Procedure Laterality Date   COLONOSCOPY WITH PROPOFOL N/A 01/16/2018   Procedure: COLONOSCOPY WITH PROPOFOL;  Surgeon: Lin Landsman, MD;  Location: [facility] ENDOSCOPY;  Service: Gastroenterology;  Laterality: N/A;    ESOPHAGOGASTRODUODENOSCOPY (EGD) WITH PROPOFOL N/A 01/16/2018   Procedure: ESOPHAGOGASTRODUODENOSCOPY (EGD) WITH PROPOFOL;  Surgeon: Lin Landsman, MD;  Location[facility] ENDOSCOPY;  Service: Gastroenterology;  Laterality: N/A;   Family History  Problem Relation Age of Onset   Diabetes Mother    Colon cancer Mother    Hypertension Father    Multiple sclerosis Father    Social History   Tobacco Use   Smoking status: Every Day    Packs/day: 0.00    Types: Cigars, Cigarettes   Smokeless tobacco: Former   Tobacco comments:    2 cigars a day  Substance Use Topics   Alcohol use: Yes    Alcohol/week: 28.0 standard drinks    Types: 7 Cans of beer, 21 Shots of liquor per week    Comment: half pint liquor per day   No Known Allergies  Prior to Admission medications   Medication Sig Start Date End Date Taking? Authorizing Provider  albuterol (VENTOLIN HFA) 108 (90 Base) MCG/ACT inhaler Inhale 2 puffs into the lungs every 6 (six) hours as needed for wheezing or shortness of breath. 12/08/21  Yes Jon Billings, NP  amLODipine (NORVASC) 10 MG tablet Take 1 tablet (10 mg total) by mouth daily. 11/30/21  Yes Alisa Graff, FNP  carvedilol (COREG) 25 MG tablet Take 1 tablet (25 mg total) by mouth 2 (two) times daily with a meal. 11/30/21  Yes Darylene Price A, FNP  furosemide (LASIX) 40 MG tablet Take 1 tablet (40 mg total) by mouth daily. Increased from  20 mg daily. 12/25/21 03/25/22 Yes Enzo Bi, MD  hydrALAZINE (APRESOLINE) 50 MG tablet Take 1 tablet (50 mg total) by mouth 3 (three) times daily. 11/30/21  Yes Darylene Price A, FNP  isosorbide mononitrate (IMDUR) 30 MG 24 hr tablet Take 1 tablet (30 mg total) by mouth daily. 12/08/21  Yes Jon Billings, NP  lisinopril (ZESTRIL) 40 MG tablet Take 1 tablet (40 mg total) by mouth daily. 11/30/21  Yes Darylene Price A, FNP  rosuvastatin (CRESTOR) 20 MG tablet Take 1 tablet (20 mg total) by mouth daily. 12/08/21  Yes Jon Billings, NP  folic acid  (FOLVITE) 1 MG tablet Take 1 tablet (1 mg total) by mouth daily. Patient not taking: Reported on 12/28/2021 12/25/21   Enzo Bi, MD  thiamine 100 MG tablet Take 1 tablet (100 mg total) by mouth daily. Patient not taking: Reported on 12/28/2021 12/08/21   Jon Billings, NP   Review of Systems  Constitutional:  Positive for fatigue (improving). Negative for appetite change.  HENT:  Positive for hearing loss. Negative for congestion and rhinorrhea.   Eyes: Negative.   Respiratory:  Positive for cough (productive) and shortness of breath (improving). Negative for chest tightness.   Cardiovascular:  Negative for chest pain, palpitations and leg swelling.  Gastrointestinal:  Negative for abdominal distention and abdominal pain.  Endocrine: Negative.   Genitourinary: Negative.   Musculoskeletal:  Positive for arthralgias (right knee). Negative for back pain.  Skin: Negative.   Allergic/Immunologic: Negative.   Neurological:  Positive for tremors. Negative for dizziness and light-headedness.  Hematological:  Negative for adenopathy. Does not bruise/bleed easily.  Psychiatric/Behavioral:  Positive for sleep disturbance (sleeping on 2 pillows). Negative for dysphoric mood. The patient is not nervous/anxious.    Vitals:   12/28/21 1545  BP: (!) 93/57  Pulse: 84  Resp: 20  SpO2: 95%  Weight: 201 lb 5 oz (91.3 kg)  Height: 5\' 10"  (1.778 m)   Wt Readings from Last 3 Encounters:  12/28/21 201 lb 5 oz (91.3 kg)  12/25/21 201 lb 8 oz (91.4 kg)  12/21/21 204 lb (92.5 kg)   Lab Results  Component Value Date   CREATININE 0.65 12/25/2021   CREATININE 0.71 12/24/2021   CREATININE 0.71 12/23/2021   Physical Exam Vitals and nursing note reviewed. Exam conducted with a chaperone present (mom).  Constitutional:      Appearance: Normal appearance.  HENT:     Head: Normocephalic and atraumatic.     Right Ear: Decreased hearing noted.     Left Ear: Decreased hearing noted.  Eyes:     Comments:  Eyelids puffy  Neck:     Vascular: No JVD.  Cardiovascular:     Rate and Rhythm: Normal rate and regular rhythm.  Pulmonary:     Effort: Pulmonary effort is normal.     Breath sounds: No wheezing, rhonchi or rales.  Abdominal:     General: There is no distension.     Palpations: Abdomen is soft.     Tenderness: There is no abdominal tenderness.  Musculoskeletal:        General: Swelling (right knee) present. No tenderness.     Cervical back: Normal range of motion and neck supple.     Right lower leg: No edema.     Left lower leg: No edema.  Skin:    General: Skin is warm and dry.  Neurological:     General: No focal deficit present.     Mental Status: He is alert  and oriented to person, place, and time.     Motor: Tremor (fine tremors in hands/ lower arms) present.  Psychiatric:        Mood and Affect: Mood normal.        Behavior: Behavior normal.    Assessment & Plan:  1. 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 (LVH)- - NYHA class II - euvolemic today - had to get batteries for his scales today; instructed to weigh daily and call for an overnight weight gain of > 2 pounds or a weekly weight gain of > 5 pounds - weight down 3 pounds from last visit here 1 week ago - not adding salt and reading food labels for sodium content - today's BP will not allow for transitioning lisinopril to entresto - consider adding SGLT2 at next visit if able - BNP 12/21/21 was 209.2  2: HTN- - BP low today (93/57); has had diuretic increased during recent admission - will check BMP next visit - saw PCP Mathis Dad) 12/08/21 and has to call and get hosp f/u appt scheduled - BMP from 12/25/21 reviewed and showed sodium 136, potassium 3.6, creatinine 0.65 and GFR >60  3: Tobacco use-  - says that he's smoking ~ 1-2 cigars daily  - complete cessation discussed for 2 minutes with him  4: Chronic alcohol use- - drinks 1/2-1 pint of vodka daily in addition to  beer although says that he hasn't had any alcohol yet today - fine hand tremors noted    Medication bottles reviewed.   Return in 2 weeks, sooner if needed. If PCP can see him within the next 2 weeks, he can call us and we can push our appointment out some.

## 2021-12-27 NOTE — Telephone Encounter (Signed)
Transition Care Management Follow-up Telephone Call  Date of discharge and from where: [facility] 12-24-2021 How have you been since you were released from the hospital?  Feeling Ok Any questions or concerns? No  Items Reviewed: Did the pt receive and understand the discharge instructions provided? Yes  Medications obtained and verified? Yes  Other? No  Any new allergies since your discharge? No  Dietary orders reviewed? No Do you have support at home? Yes   Home Care and Equipment/Supplies: Were home health services ordered? yes If so, what is the name of the agency? Sent home with oxygen  Has the agency set up a time to come to the patient's home? not applicable Were any new equipment or medical supplies ordered?  No What is the name of the medical supply agency?  Were you able to get the supplies/equipment? not applicable Do you have any questions related to the use of the equipment or supplies? No  Functional Questionnaire: (I = Independent and D = Dependent) ADLs:    Bathing/Dressing-  I   Meal Prep- I  Eating- I  Maintaining continence- I  Transferring/Ambulation- I  Managing Meds- I  Follow up appointments reviewed:  PCP Hospital f/u appt confirmed? No  Scheduled to patient transportation will call to coordinate appointment  [facility] f/u appt confirmed? Yes  Scheduled to see [facility] on 12-28-2021 @ 4:00. Are transportation arrangements needed? No  If their condition worsens, is the pt aware to call PCP or go to the Emergency Dept.? Yes Was the patient provided with contact information for the PCP's office or ED? Yes Was to pt encouraged to call back with questions or concerns? Yes

## 2021-12-28 ENCOUNTER — Ambulatory Visit: Payer: BC Managed Care – PPO | Attending: Family | Admitting: Family

## 2021-12-28 ENCOUNTER — Encounter: Payer: Self-pay | Admitting: Family

## 2021-12-28 ENCOUNTER — Other Ambulatory Visit: Payer: Self-pay

## 2021-12-28 VITALS — BP 93/57 | HR 84 | Resp 20 | Ht 70.0 in | Wt 201.3 lb

## 2021-12-28 DIAGNOSIS — Z79899 Other long term (current) drug therapy: Secondary | ICD-10-CM | POA: Diagnosis not present

## 2021-12-28 DIAGNOSIS — F1010 Alcohol abuse, uncomplicated: Secondary | ICD-10-CM | POA: Diagnosis not present

## 2021-12-28 DIAGNOSIS — F1090 Alcohol use, unspecified, uncomplicated: Secondary | ICD-10-CM | POA: Diagnosis not present

## 2021-12-28 DIAGNOSIS — R600 Localized edema: Secondary | ICD-10-CM | POA: Diagnosis not present

## 2021-12-28 DIAGNOSIS — F17200 Nicotine dependence, unspecified, uncomplicated: Secondary | ICD-10-CM | POA: Diagnosis not present

## 2021-12-28 DIAGNOSIS — I10 Essential (primary) hypertension: Secondary | ICD-10-CM

## 2021-12-28 DIAGNOSIS — H9190 Unspecified hearing loss, unspecified ear: Secondary | ICD-10-CM | POA: Insufficient documentation

## 2021-12-28 DIAGNOSIS — M25561 Pain in right knee: Secondary | ICD-10-CM | POA: Insufficient documentation

## 2021-12-28 DIAGNOSIS — R251 Tremor, unspecified: Secondary | ICD-10-CM | POA: Diagnosis not present

## 2021-12-28 DIAGNOSIS — R569 Unspecified convulsions: Secondary | ICD-10-CM | POA: Diagnosis not present

## 2021-12-28 DIAGNOSIS — I110 Hypertensive heart disease with heart failure: Secondary | ICD-10-CM | POA: Diagnosis not present

## 2021-12-28 DIAGNOSIS — J449 Chronic obstructive pulmonary disease, unspecified: Secondary | ICD-10-CM | POA: Insufficient documentation

## 2021-12-28 DIAGNOSIS — E785 Hyperlipidemia, unspecified: Secondary | ICD-10-CM | POA: Insufficient documentation

## 2021-12-28 DIAGNOSIS — I5032 Chronic diastolic (congestive) heart failure: Secondary | ICD-10-CM | POA: Diagnosis not present

## 2021-12-28 DIAGNOSIS — G479 Sleep disorder, unspecified: Secondary | ICD-10-CM | POA: Diagnosis not present

## 2021-12-28 DIAGNOSIS — F17290 Nicotine dependence, other tobacco product, uncomplicated: Secondary | ICD-10-CM | POA: Insufficient documentation

## 2021-12-28 NOTE — Patient Instructions (Signed)
Continue weighing daily and call for an overnight weight gain of 3 pounds or more or a weekly weight gain of more than 5 pounds.  °

## 2021-12-29 ENCOUNTER — Telehealth: Payer: Self-pay | Admitting: Nurse Practitioner

## 2021-12-29 NOTE — Telephone Encounter (Signed)
Copied from [place] [id number]. Topic: Appointment Scheduling - Scheduling Inquiry for Clinic >> Dec 29, 2021  3:12 PM Valere Dross wrote: Reason for CRM: Pts mother called in to get pt a Hospital f/u appt, but it was booked out further than needed, pt requested if PCP could get pt squeezed in sooner, it would be better for Mondays or after 3, please advise.

## 2021-12-30 NOTE — Telephone Encounter (Signed)
2/27 at 1pm

## 2021-12-30 NOTE — Telephone Encounter (Signed)
Pt scheduled in 11:20 slot

## 2022-01-03 ENCOUNTER — Encounter: Payer: Self-pay | Admitting: Nurse Practitioner

## 2022-01-03 ENCOUNTER — Ambulatory Visit (INDEPENDENT_AMBULATORY_CARE_PROVIDER_SITE_OTHER): Payer: BC Managed Care – PPO | Admitting: Nurse Practitioner

## 2022-01-03 ENCOUNTER — Other Ambulatory Visit: Payer: Self-pay

## 2022-01-03 VITALS — BP 130/80 | HR 80 | Temp 98.7°F | Wt 203.6 lb

## 2022-01-03 DIAGNOSIS — I509 Heart failure, unspecified: Secondary | ICD-10-CM

## 2022-01-03 DIAGNOSIS — R569 Unspecified convulsions: Secondary | ICD-10-CM | POA: Diagnosis not present

## 2022-01-03 DIAGNOSIS — I10 Essential (primary) hypertension: Secondary | ICD-10-CM

## 2022-01-03 DIAGNOSIS — R7301 Impaired fasting glucose: Secondary | ICD-10-CM | POA: Diagnosis not present

## 2022-01-03 DIAGNOSIS — R202 Paresthesia of skin: Secondary | ICD-10-CM | POA: Diagnosis not present

## 2022-01-03 DIAGNOSIS — J449 Chronic obstructive pulmonary disease, unspecified: Secondary | ICD-10-CM | POA: Diagnosis not present

## 2022-01-03 DIAGNOSIS — F1010 Alcohol abuse, uncomplicated: Secondary | ICD-10-CM

## 2022-01-03 DIAGNOSIS — G40909 Epilepsy, unspecified, not intractable, without status epilepticus: Secondary | ICD-10-CM | POA: Diagnosis not present

## 2022-01-03 DIAGNOSIS — R200 Anesthesia of skin: Secondary | ICD-10-CM

## 2022-01-03 NOTE — Assessment & Plan Note (Signed)
Chronic. Was discharged on 2L of O2.  Was not wearing in office today and o2 sat was 95%.  Discussed referral to Pulmonology due to needing a 62min walk test to D/C oxygen post hospitalization. Encouraged patient to buy a pulse ox to track O2 sats at home.  Discussed use of inhalers. Follow up in 1 month for reevaluation.

## 2022-01-03 NOTE — Assessment & Plan Note (Signed)
Chronic.  Controlled.  Continue with current medication regimen.  Labs ordered today.  Return to clinic in 6 months for reevaluation.  Call sooner if concerns arise.  ? ?

## 2022-01-03 NOTE — Assessment & Plan Note (Signed)
Chronic. Encouraged patient to make appointment with Neurology for evaluation and treatment of seizures.  He has been off his Tegretol for several months. Has been encouraged to follow up with Neurology in the past and has not done so yet.

## 2022-01-03 NOTE — Assessment & Plan Note (Addendum)
Chronic. Encouraged patient to make appointment with Neurology for evaluation and treatment of seizures.  He has been off his Tegretol for several months. Has been encouraged to follow up with Neurology in the past and has not done so yet.

## 2022-01-03 NOTE — Assessment & Plan Note (Signed)
Chronic. Continue Lasix 40mg  daily. Continue to follow up with Heart Failure clinic. Follow up in 1 month for reevaluation. Continue weighing daily and call for an overnight weight gain of 3 pounds or more or a weekly weight gain of more than 5 pounds.

## 2022-01-03 NOTE — Assessment & Plan Note (Signed)
Discussed at visit today. Discussed community resources to aid in alcohol cessation when he is ready. Do not recommend suddenly stopping alcohol consumption due to history of Epilepsy. Patient is not ready to seek assistance.  Will reassess at 1 month follow up.

## 2022-01-03 NOTE — Patient Instructions (Signed)
Making appt with Pulmonology to discuss oxygen Making an appt with Neurologist.

## 2022-01-03 NOTE — Progress Notes (Signed)
BP 130/80 (BP Location: Right Arm, Cuff Size: Normal)    Pulse 80    Temp 98.7 F (37.1 C) (Oral)    Wt 203 lb 9.6 oz (92.4 kg)    SpO2 95%    BMI 29.21 kg/m    Subjective:    Patient ID: Jose Dakins., male    DOB: [birth year], 51 y.o.   MRN: [id number]  HPI: Jose Adduci. is a 51 y.o. male  Chief Complaint  Patient presents with   Hospitalization Follow-up    States he has been out of work since 12/22/21, hoping to go back to work 01/10/22   Transition of [facility] Follow up.   Hospital/Facility: [facility] D/C Physician:  Dr. Billie Ruddy D/C Date: 12/25/2021  Records Requested: NA Records Received: NA Records Reviewed: Reviewed  Diagnoses on Discharge:   Acute hypoxemic respiratory failure --requiring BiPAP initially --was treated for both CHF and COPD exacerbation on admission. --prior to discharge, pt was sating well on room air at rest, however, dropped to 88% with walking, so pt was discharged on 2L home O2 and advised to follow up with PCP to see when he can get off of supplemental oxygen.   Acute on chronic HFpEF  The patient last 2D echo on 11/05/2021 revealed an EF of 60 to 65% with grade 1 diastolic dysfunction. --CXR showed diffuse interstitial edema --Pt received IV lasix 40 mg BID during hospitalization. --Pt was discharged on oral lasix 40 mg daily (increased from 20 mg daily PTA).- Patient saw HF clinic who recommends he continue with the Lasix 9m.  COPD acute exacerbation likely secondary to acute bronchitis --started on IV solumedrol, ceftriaxone and azithromycin on admission, ceftriaxone since d/c'ed. --Pt completed steroid burst with prednisone 40 mg daily during hospitalization. --Pt completed a course of azithromycin States it is progressively getting better. Does not have a Pulse Ox at home to check oxygen levels.   Essential hypertension. cont home Coreg, amlodipine, Zestril, hydralazine and Imdur. --Pt was discharged on oral lasix 40 mg daily (increased  from 20 mg daily PTA).  Alcohol abuse. --no withdrawal symptoms so far --cont folic acid and thiamine  Seizure disorder. --pt was not taking Tegretol PTA  Dyslipidemia. - cont home statin  Date of interactive Contact within 48 hours of discharge:  Contact was through: phone  Date of 7 day or 14 day face-to-face visit:    within 7 days  Outpatient Encounter Medications as of 01/03/2022  Medication Sig   albuterol (VENTOLIN HFA) 108 (90 Base) MCG/ACT inhaler Inhale 2 puffs into the lungs every 6 (six) hours as needed for wheezing or shortness of breath.   amLODipine (NORVASC) 10 MG tablet Take 1 tablet (10 mg total) by mouth daily.   carvedilol (COREG) 25 MG tablet Take 1 tablet (25 mg total) by mouth 2 (two) times daily with a meal.   furosemide (LASIX) 40 MG tablet Take 1 tablet (40 mg total) by mouth daily. Increased from 20 mg daily.   hydrALAZINE (APRESOLINE) 50 MG tablet Take 1 tablet (50 mg total) by mouth 3 (three) times daily.   lisinopril (ZESTRIL) 40 MG tablet Take 1 tablet (40 mg total) by mouth daily.   rosuvastatin (CRESTOR) 20 MG tablet Take 1 tablet (20 mg total) by mouth daily.   isosorbide mononitrate (IMDUR) 30 MG 24 hr tablet Take 1 tablet (30 mg total) by mouth daily. (Patient not taking: Reported on 01/03/2022)   [DISCONTINUED] folic acid (FOLVITE) 1 MG tablet Take  1 tablet (1 mg total) by mouth daily. (Patient not taking: Reported on 12/28/2021)   [DISCONTINUED] thiamine 100 MG tablet Take 1 tablet (100 mg total) by mouth daily. (Patient not taking: Reported on 12/28/2021)   No facility-administered encounter medications on file as of 01/03/2022.    Diagnostic Tests Reviewed/Disposition: Reviewed  Consults: Cardiology. Referral given for Pulmonology  Discharge Instructions: Reviewed during visit  Disease/illness Education: Provided during visit  Home Health/Community Services Discussions/Referrals: Discussed possible referral for alcohol cessation.    Establishment or re-establishment of referral orders for community resources: NA  Discussion with other health care providers: Referral placed for Pulmonology to d/c Oxygen  Assessment and Support of treatment regimen adherence: Reviewed medications during visit today.  Appointments Coordinated with: Patient  Education for self-management, independent living, and ADLs: Able to perform ADLs  Relevant past medical, surgical, family and social history reviewed and updated as indicated. Interim medical history since our last visit reviewed. Allergies and medications reviewed and updated.  Review of Systems  Eyes:  Negative for visual disturbance.  Respiratory:  Negative for chest tightness and shortness of breath.   Cardiovascular:  Negative for chest pain, palpitations and leg swelling.  Neurological:  Negative for dizziness, light-headedness and headaches.   Per HPI unless specifically indicated above     Objective:    BP 130/80 (BP Location: Right Arm, Cuff Size: Normal)    Pulse 80    Temp 98.7 F (37.1 C) (Oral)    Wt 203 lb 9.6 oz (92.4 kg)    SpO2 95%    BMI 29.21 kg/m   Wt Readings from Last 3 Encounters:  01/03/22 203 lb 9.6 oz (92.4 kg)  12/28/21 201 lb 5 oz (91.3 kg)  12/25/21 201 lb 8 oz (91.4 kg)    Physical Exam Vitals and nursing note reviewed.  Constitutional:      General: He is not in acute distress.    Appearance: Normal appearance. He is not ill-appearing, toxic-appearing or diaphoretic.  HENT:     Head: Normocephalic.     Right Ear: External ear normal.     Left Ear: External ear normal.     Nose: Nose normal. No congestion or rhinorrhea.     Mouth/Throat:     Mouth: Mucous membranes are moist.  Eyes:     General:        Right eye: No discharge.        Left eye: No discharge.     Extraocular Movements: Extraocular movements intact.     Conjunctiva/sclera: Conjunctivae normal.     Pupils: Pupils are equal, round, and reactive to light.   Cardiovascular:     Rate and Rhythm: Normal rate and regular rhythm.     Heart sounds: No murmur heard. Pulmonary:     Effort: Pulmonary effort is normal. No respiratory distress.     Breath sounds: Normal breath sounds. No wheezing, rhonchi or rales.  Abdominal:     General: Abdomen is flat. Bowel sounds are normal.  Musculoskeletal:     Cervical back: Normal range of motion and neck supple.  Skin:    General: Skin is warm and dry.     Capillary Refill: Capillary refill takes less than 2 seconds.  Neurological:     General: No focal deficit present.     Mental Status: He is alert and oriented to person, place, and time.  Psychiatric:        Mood and Affect: Mood normal.  Behavior: Behavior normal.        Thought Content: Thought content normal.        Judgment: Judgment normal.    Results for orders placed or performed during the hospital encounter of 12/21/21  Resp Panel by RT-PCR (Flu A&B, Covid) Nasopharyngeal Swab   Specimen: Nasopharyngeal Swab; Nasopharyngeal(NP) swabs in vial transport medium  Result Value Ref Range   SARS Coronavirus 2 by RT PCR NEGATIVE NEGATIVE   Influenza A by PCR NEGATIVE NEGATIVE   Influenza B by PCR NEGATIVE NEGATIVE  Culture, blood (Routine X 2) w Reflex to ID Panel   Specimen: BLOOD  Result Value Ref Range   Specimen Description BLOOD RIGHT ASSIST CONTROL    Special Requests      BOTTLES DRAWN AEROBIC AND ANAEROBIC Blood Culture results may not be optimal due to an excessive volume of blood received in culture bottles   Culture      NO GROWTH 5 DAYS Performed at [facility], [address] [place], [place] [address]    Report Status 12/26/2021 FINAL   Culture, blood (Routine X 2) w Reflex to ID Panel   Specimen: BLOOD  Result Value Ref Range   Specimen Description BLOOD RIGHT FOREARM    Special Requests      BOTTLES DRAWN AEROBIC AND ANAEROBIC Blood Culture results may not be optimal due to an excessive volume of  blood received in culture bottles   Culture      NO GROWTH 5 DAYS Performed at [facility], [address] [place],  [address]    Report Status 12/26/2021 FINAL   Respiratory (~20 pathogens) panel by PCR   Specimen: Nasopharyngeal Swab; Respiratory  Result Value Ref Range   Adenovirus NOT DETECTED NOT DETECTED   Coronavirus 229E NOT DETECTED NOT DETECTED   Coronavirus HKU1 NOT DETECTED NOT DETECTED   Coronavirus NL63 NOT DETECTED NOT DETECTED   Coronavirus OC43 NOT DETECTED NOT DETECTED   Metapneumovirus NOT DETECTED NOT DETECTED   Rhinovirus / Enterovirus NOT DETECTED NOT DETECTED   Influenza A NOT DETECTED NOT DETECTED   Influenza B NOT DETECTED NOT DETECTED   Parainfluenza Virus 1 NOT DETECTED NOT DETECTED   Parainfluenza Virus 2 NOT DETECTED NOT DETECTED   Parainfluenza Virus 3 NOT DETECTED NOT DETECTED   Parainfluenza Virus 4 NOT DETECTED NOT DETECTED   Respiratory Syncytial Virus NOT DETECTED NOT DETECTED   Bordetella pertussis NOT DETECTED NOT DETECTED   Bordetella Parapertussis NOT DETECTED NOT DETECTED   Chlamydophila pneumoniae NOT DETECTED NOT DETECTED   Mycoplasma pneumoniae NOT DETECTED NOT DETECTED  Brain natriuretic peptide  Result Value Ref Range   B Natriuretic Peptide 209.2 (H) 0.0 - 100.0 pg/mL  CBC with Differential  Result Value Ref Range   WBC 12.7 (H) 4.0 - 10.5 K/uL   RBC 3.92 (L) 4.22 - 5.81 MIL/uL   Hemoglobin 12.0 (L) 13.0 - 17.0 g/dL   HCT 37.0 (L) 39.0 - 52.0 %   MCV 94.4 80.0 - 100.0 fL   MCH 30.6 26.0 - 34.0 pg   MCHC 32.4 30.0 - 36.0 g/dL   RDW 14.6 11.5 - 15.5 %   Platelets 172 150 - 400 K/uL   nRBC 0.6 (H) 0.0 - 0.2 %   Neutrophils Relative % 71 %   Neutro Abs 9.1 (H) 1.7 - 7.7 K/uL   Lymphocytes Relative 8 %   Lymphs Abs 1.0 0.7 - 4.0 K/uL   Monocytes Relative 18 %   Monocytes Absolute  2.3 (H) 0.1 - 1.0 K/uL   Eosinophils Relative 1 %   Eosinophils Absolute 0.2 0.0 - 0.5 K/uL   Basophils Relative 1 %   Basophils  Absolute 0.1 0.0 - 0.1 K/uL   Immature Granulocytes 1 %   Abs Immature Granulocytes 0.06 0.00 - 0.07 K/uL  Procalcitonin - Baseline  Result Value Ref Range   Procalcitonin 0.23 ng/mL  Lactic acid, plasma  Result Value Ref Range   Lactic Acid, Venous 1.3 0.5 - 1.9 mmol/L  Basic metabolic panel  Result Value Ref Range   Sodium 137 135 - 145 mmol/L   Potassium 4.3 3.5 - 5.1 mmol/L   Chloride 99 98 - 111 mmol/L   CO2 27 22 - 32 mmol/L   Glucose, Bld 145 (H) 70 - 99 mg/dL   BUN 33 (H) 6 - 20 mg/dL   Creatinine, Ser 0.67 0.61 - 1.24 mg/dL   Calcium 8.9 8.9 - 10.3 mg/dL   GFR, Estimated >60 >60 mL/min   Anion gap 11 5 - 15  CBC  Result Value Ref Range   WBC 10.0 4.0 - 10.5 K/uL   RBC 4.05 (L) 4.22 - 5.81 MIL/uL   Hemoglobin 12.1 (L) 13.0 - 17.0 g/dL   HCT 38.1 (L) 39.0 - 52.0 %   MCV 94.1 80.0 - 100.0 fL   MCH 29.9 26.0 - 34.0 pg   MCHC 31.8 30.0 - 36.0 g/dL   RDW 14.2 11.5 - 15.5 %   Platelets 184 150 - 400 K/uL   nRBC 0.5 (H) 0.0 - 0.2 %  HIV Antibody (routine testing w rflx)  Result Value Ref Range   HIV Screen 4th Generation wRfx Non Reactive Non Reactive  Basic metabolic panel  Result Value Ref Range   Sodium 135 135 - 145 mmol/L   Potassium 3.8 3.5 - 5.1 mmol/L   Chloride 96 (L) 98 - 111 mmol/L   CO2 31 22 - 32 mmol/L   Glucose, Bld 120 (H) 70 - 99 mg/dL   BUN 33 (H) 6 - 20 mg/dL   Creatinine, Ser 0.71 0.61 - 1.24 mg/dL   Calcium 8.8 (L) 8.9 - 10.3 mg/dL   GFR, Estimated >60 >60 mL/min   Anion gap 8 5 - 15  CBC  Result Value Ref Range   WBC 11.8 (H) 4.0 - 10.5 K/uL   RBC 3.67 (L) 4.22 - 5.81 MIL/uL   Hemoglobin 11.1 (L) 13.0 - 17.0 g/dL   HCT 34.1 (L) 39.0 - 52.0 %   MCV 92.9 80.0 - 100.0 fL   MCH 30.2 26.0 - 34.0 pg   MCHC 32.6 30.0 - 36.0 g/dL   RDW 13.8 11.5 - 15.5 %   Platelets 209 150 - 400 K/uL   nRBC 0.6 (H) 0.0 - 0.2 %  Magnesium  Result Value Ref Range   Magnesium 1.5 (L) 1.7 - 2.4 mg/dL  Basic metabolic panel  Result Value Ref Range   Sodium  136 135 - 145 mmol/L   Potassium 3.5 3.5 - 5.1 mmol/L   Chloride 96 (L) 98 - 111 mmol/L   CO2 31 22 - 32 mmol/L   Glucose, Bld 105 (H) 70 - 99 mg/dL   BUN 31 (H) 6 - 20 mg/dL   Creatinine, Ser 0.71 0.61 - 1.24 mg/dL   Calcium 9.3 8.9 - 10.3 mg/dL   GFR, Estimated >60 >60 mL/min   Anion gap 9 5 - 15  CBC  Result Value Ref Range   WBC 10.8 (H)  4.0 - 10.5 K/uL   RBC 3.81 (L) 4.22 - 5.81 MIL/uL   Hemoglobin 11.4 (L) 13.0 - 17.0 g/dL   HCT 35.5 (L) 39.0 - 52.0 %   MCV 93.2 80.0 - 100.0 fL   MCH 29.9 26.0 - 34.0 pg   MCHC 32.1 30.0 - 36.0 g/dL   RDW 13.9 11.5 - 15.5 %   Platelets 199 150 - 400 K/uL   nRBC 0.2 0.0 - 0.2 %  Magnesium  Result Value Ref Range   Magnesium 1.7 1.7 - 2.4 mg/dL  Basic metabolic panel  Result Value Ref Range   Sodium 136 135 - 145 mmol/L   Potassium 3.6 3.5 - 5.1 mmol/L   Chloride 94 (L) 98 - 111 mmol/L   CO2 32 22 - 32 mmol/L   Glucose, Bld 107 (H) 70 - 99 mg/dL   BUN 27 (H) 6 - 20 mg/dL   Creatinine, Ser 0.65 0.61 - 1.24 mg/dL   Calcium 9.5 8.9 - 10.3 mg/dL   GFR, Estimated >60 >60 mL/min   Anion gap 10 5 - 15  CBC  Result Value Ref Range   WBC 10.3 4.0 - 10.5 K/uL   RBC 4.08 (L) 4.22 - 5.81 MIL/uL   Hemoglobin 12.1 (L) 13.0 - 17.0 g/dL   HCT 37.3 (L) 39.0 - 52.0 %   MCV 91.4 80.0 - 100.0 fL   MCH 29.7 26.0 - 34.0 pg   MCHC 32.4 30.0 - 36.0 g/dL   RDW 13.6 11.5 - 15.5 %   Platelets 209 150 - 400 K/uL   nRBC 0.0 0.0 - 0.2 %  Magnesium  Result Value Ref Range   Magnesium 1.6 (L) 1.7 - 2.4 mg/dL  Troponin I (High Sensitivity)  Result Value Ref Range   Troponin I (High Sensitivity) 14 <18 ng/L  Troponin I (High Sensitivity)  Result Value Ref Range   Troponin I (High Sensitivity) 16 <18 ng/L      Assessment & Plan:   Problem List Items Addressed This Visit       Cardiovascular and Mediastinum   Hypertension - Primary    Chronic.  Controlled.  Continue with current medication regimen.  Labs ordered today.  Return to clinic in 6 months  for reevaluation.  Call sooner if concerns arise.        Acute CHF (congestive heart failure) (HCC)    Chronic. Continue Lasix 71m daily. Continue to follow up with Heart Failure clinic. Follow up in 1 month for reevaluation. Continue weighing daily and call for an overnight weight gain of 3 pounds or more or a weekly weight gain of more than 5 pounds.      Relevant Orders   Comp Met (CMET)     Respiratory   COPD (chronic obstructive pulmonary disease) (HCC)    Chronic. Was discharged on 2L of O2.  Was not wearing in office today and o2 sat was 95%.  Discussed referral to Pulmonology due to needing a 666m walk test to D/C oxygen post hospitalization. Encouraged patient to buy a pulse ox to track O2 sats at home.  Discussed use of inhalers. Follow up in 1 month for reevaluation.      Relevant Orders   CBC w/Diff   Comp Met (CMET)   Ambulatory referral to Pulmonology     Nervous and Auditory   Epilepsy ([facility]   Chronic. Encouraged patient to make appointment with Neurology for evaluation and treatment of seizures.  He has been off his Tegretol  for several months. Has been encouraged to follow up with Neurology in the past and has not done so yet.       Relevant Orders   Comp Met (CMET)     Other   Seizures ([place])    Chronic. Encouraged patient to make appointment with Neurology for evaluation and treatment of seizures.  He has been off his Tegretol for several months. Has been encouraged to follow up with Neurology in the past and has not done so yet.       Relevant Orders   CBC w/Diff   Chronic alcohol abuse    Discussed at visit today. Discussed community resources to aid in alcohol cessation when he is ready. Do not recommend suddenly stopping alcohol consumption due to history of Epilepsy. Patient is not ready to seek assistance.  Will reassess at 1 month follow up.      Other Visit Diagnoses     Elevated fasting blood sugar       Labs ordered today. Will make  recommendations based on lab results.    Relevant Orders   HgB A1c   Hypomagnesemia       Labs ordered today. Will make recommendations based on lab results.    Relevant Orders   Magnesium   Numbness and tingling in right hand       Labs ordered today. Will make recommendations based on lab results.    Relevant Orders   B12        Follow up plan: Return in about 1 month (around 01/31/2022) for Heart Failure .

## 2022-01-04 ENCOUNTER — Telehealth: Payer: Self-pay | Admitting: Nurse Practitioner

## 2022-01-04 LAB — CBC WITH DIFFERENTIAL/PLATELET
Basophils Absolute: 0.1 10*3/uL (ref 0.0–0.2)
Basos: 1 %
EOS (ABSOLUTE): 0.1 10*3/uL (ref 0.0–0.4)
Eos: 2 %
Hematocrit: 36.9 % — ABNORMAL LOW (ref 37.5–51.0)
Hemoglobin: 12.6 g/dL — ABNORMAL LOW (ref 13.0–17.7)
Immature Grans (Abs): 0 10*3/uL (ref 0.0–0.1)
Immature Granulocytes: 1 %
Lymphocytes Absolute: 1.8 10*3/uL (ref 0.7–3.1)
Lymphs: 25 %
MCH: 30.3 pg (ref 26.6–33.0)
MCHC: 34.1 g/dL (ref 31.5–35.7)
MCV: 89 fL (ref 79–97)
Monocytes Absolute: 0.9 10*3/uL (ref 0.1–0.9)
Monocytes: 12 %
Neutrophils Absolute: 4.2 10*3/uL (ref 1.4–7.0)
Neutrophils: 59 %
Platelets: 362 10*3/uL (ref 150–450)
RBC: 4.16 x10E6/uL (ref 4.14–5.80)
RDW: 13.1 % (ref 11.6–15.4)
WBC: 7.1 10*3/uL (ref 3.4–10.8)

## 2022-01-04 LAB — COMPREHENSIVE METABOLIC PANEL
ALT: 17 IU/L (ref 0–44)
AST: 19 IU/L (ref 0–40)
Albumin/Globulin Ratio: 1.4 (ref 1.2–2.2)
Albumin: 4.2 g/dL (ref 3.8–4.9)
Alkaline Phosphatase: 89 IU/L (ref 44–121)
BUN/Creatinine Ratio: 30 — ABNORMAL HIGH (ref 9–20)
BUN: 20 mg/dL (ref 6–24)
Bilirubin Total: 0.2 mg/dL (ref 0.0–1.2)
CO2: 26 mmol/L (ref 20–29)
Calcium: 9.7 mg/dL (ref 8.7–10.2)
Chloride: 98 mmol/L (ref 96–106)
Creatinine, Ser: 0.66 mg/dL — ABNORMAL LOW (ref 0.76–1.27)
Globulin, Total: 3 g/dL (ref 1.5–4.5)
Glucose: 94 mg/dL (ref 70–99)
Potassium: 4.4 mmol/L (ref 3.5–5.2)
Sodium: 141 mmol/L (ref 134–144)
Total Protein: 7.2 g/dL (ref 6.0–8.5)
eGFR: 113 mL/min/{1.73_m2} (ref >59)

## 2022-01-04 LAB — MAGNESIUM: Magnesium: 1 mg/dL — ABNORMAL LOW (ref 1.6–2.3)

## 2022-01-04 LAB — VITAMIN B12: Vitamin B-12: 336 pg/mL (ref 232–1245)

## 2022-01-04 LAB — HEMOGLOBIN A1C
Est. average glucose Bld gHb Est-mCnc: 131 mg/dL
Hgb A1c MFr Bld: 6.2 % — ABNORMAL HIGH (ref 4.8–5.6)

## 2022-01-04 NOTE — Progress Notes (Signed)
Please let patient know that his lab work shows that his anemia is improving from hospitalization.  His lab work does show that he is prediabetic. Something we will need to monitor over time.  I recommend decreasing carbohydrate intake.  His magnesium is low.  I recommend an over the counter supplement to help with this.  We will continue to monitor at future visits.

## 2022-01-04 NOTE — Telephone Encounter (Signed)
Copied from [place] [id number]. Topic: General - Other >> Jan 04, 2022 12:34 PM Bayard Beaver wrote: Reason for CRM:kpt called in checking status of paperwork for disability being filled out. Please call back

## 2022-01-04 NOTE — Telephone Encounter (Signed)
Pt called in to check status of forms, please advise

## 2022-01-04 NOTE — Telephone Encounter (Signed)
Paperwork completed for the patient, called and let him know that it was ready. Patient states he will be by tomorrow to pick it up.

## 2022-01-13 ENCOUNTER — Ambulatory Visit: Payer: BC Managed Care – PPO | Admitting: Family

## 2022-01-14 ENCOUNTER — Telehealth: Payer: Self-pay | Admitting: Nurse Practitioner

## 2022-01-14 NOTE — Telephone Encounter (Signed)
Pt made an additional call regarding needing a copy of previous disability paperwork, pt was also given number to medical records.  ?

## 2022-01-14 NOTE — Telephone Encounter (Signed)
Copied from [place] [phone]. Topic: General - Other ?>> Jan 14, 2022  9:26 AM Bayard Beaver wrote: ?Reason for CRM: pt called in says needs copy od disability paperwork,asap. ?

## 2022-01-17 NOTE — Telephone Encounter (Signed)
Returned phone call to patient advising we have filled out FMLA paperwork for him recently but not disability paperwork. Advised patient we don't do long term disability only short term but he should reach out to his job first to initiate that process, patient verbally understood.  ?

## 2022-01-24 DIAGNOSIS — I5032 Chronic diastolic (congestive) heart failure: Secondary | ICD-10-CM | POA: Diagnosis not present

## 2022-01-24 DIAGNOSIS — D500 Iron deficiency anemia secondary to blood loss (chronic): Secondary | ICD-10-CM | POA: Diagnosis not present

## 2022-01-24 DIAGNOSIS — F10920 Alcohol use, unspecified with intoxication, uncomplicated: Secondary | ICD-10-CM | POA: Diagnosis not present

## 2022-01-24 DIAGNOSIS — I509 Heart failure, unspecified: Secondary | ICD-10-CM | POA: Diagnosis not present

## 2022-01-31 ENCOUNTER — Ambulatory Visit: Payer: BC Managed Care – PPO | Admitting: Nurse Practitioner

## 2022-01-31 NOTE — Progress Notes (Signed)
? Patient ID: Jose Dakins., male    DOB: 1971-10-05, 51 y.o.   MRN: [id number] ? ?[name] is a 51 y/o male with a history of hyperlipidemia, HTN, current tobacco/ alcohol use and chronic heart failure.  ? ?Echo report from 11/05/21 reviewed and showed an EF of 60-65% along with moderate LVH. Echo report from 01/29/2019 reviewed and showed an EF of 60-65% ? ?Admitted 12/21/21 due to SOB due to COPD/HF exacerbations. Initially needed bipap. Weaned off to oxygen upon exertion. Initially given IV lasix with transition to oral diuretics. Given IV solumedrol and antiibiotics. Discharged after 4 days. Was in the ED 11/30/21 due to fatigue, weakness, chills and body aches. Workup unremarkable and he was released. Was in the ED 11/04/21 due to seizures due to alcohol use and being out of seizure medication for a couple of days while visiting family. Head CT negative for acute process. Given IV ativan for signs of alcohol withdrawal and admission offered. Patient declined admission and says that he's not interested in stopping drinking. Released with neurology referral.  ? ?He presents today for a follow-up visit with a chief complaint of minimal fatigue upon moderate exertion. He describes this as chronic in nature having been present for several years. He has associated cough, shortness of breath, rhinorrhea, tremors and slight weight gain along with this. He denies any dizziness, difficulty sleeping, abdominal distention, palpitations, pedal edema, chest pain or fevers.  ? ?Past Medical History:  ?Diagnosis Date  ? Alcohol abuse   ? Cervicalgia   ? CHF (congestive heart failure) ([place])   ? Hyperlipidemia   ? Hypertension   ? Seizures ([place])   ? ?Past Surgical History:  ?Procedure Laterality Date  ? COLONOSCOPY WITH PROPOFOL N/A 01/16/2018  ? Procedure: COLONOSCOPY WITH PROPOFOL;  Surgeon: Lin Landsman, MD;  Location[facility] ENDOSCOPY;  Service: Gastroenterology;  Laterality: N/A;  ? ESOPHAGOGASTRODUODENOSCOPY (EGD) WITH  PROPOFOL N/A 01/16/2018  ? Procedure: ESOPHAGOGASTRODUODENOSCOPY (EGD) WITH PROPOFOL;  Surgeon: Lin Landsman, MD;  Location: [facility] ENDOSCOPY;  Service: Gastroenterology;  Laterality: N/A;  ? ?Family History  ?Problem Relation Age of Onset  ? Diabetes Mother   ? Colon cancer Mother   ? Hypertension Father   ? Multiple sclerosis Father   ? ?Social History  ? ?Tobacco Use  ? Smoking status: Every Day  ?  Types: Cigars  ? Smokeless tobacco: Former  ? Tobacco comments:  ?  2 cigars a day  ?Substance Use Topics  ? Alcohol use: Yes  ?  Alcohol/week: 28.0 standard drinks  ?  Types: 7 Cans of beer, 21 Shots of liquor per week  ?  Comment: half pint liquor per day  ? ?No Known Allergies ? ?Prior to Admission medications   ?Medication Sig Start Date End Date Taking? Authorizing Provider  ?albuterol (VENTOLIN HFA) 108 (90 Base) MCG/ACT inhaler Inhale 2 puffs into the lungs every 6 (six) hours as needed for wheezing or shortness of breath. 12/08/21  Yes Jon Billings, NP  ?amLODipine (NORVASC) 10 MG tablet Take 1 tablet (10 mg total) by mouth daily. 11/30/21  Yes Alisa Graff, FNP  ?carvedilol (COREG) 25 MG tablet Take 1 tablet (25 mg total) by mouth 2 (two) times daily with a meal. 11/30/21  Yes Alisa Graff, FNP  ?furosemide (LASIX) 40 MG tablet Take 1 tablet (40 mg total) by mouth daily. 12/25/21 03/25/22 Yes Enzo Bi, MD  ?hydrALAZINE (APRESOLINE) 50 MG tablet Take 1 tablet (50 mg total) by mouth  3 (three) times daily. 11/30/21  Yes Alisa Graff, FNP  ?isosorbide mononitrate (IMDUR) 30 MG 24 hr tablet Take 1 tablet (30 mg total) by mouth daily. 12/08/21  Yes Jon Billings, NP  ?lisinopril (ZESTRIL) 40 MG tablet Take 1 tablet (40 mg total) by mouth daily. 11/30/21  Yes Alisa Graff, FNP  ?rosuvastatin (CRESTOR) 20 MG tablet Take 1 tablet (20 mg total) by mouth daily. 12/08/21  Yes Jon Billings, NP  ? ? ?Review of Systems  ?Constitutional:  Positive for fatigue (improving). Negative for appetite change and  fever.  ?HENT:  Positive for hearing loss and rhinorrhea. Negative for congestion and sore throat.   ?Eyes: Negative.   ?Respiratory:  Positive for cough (productive) and shortness of breath. Negative for chest tightness.   ?Cardiovascular:  Negative for chest pain, palpitations and leg swelling.  ?Gastrointestinal:  Negative for abdominal distention and abdominal pain.  ?Endocrine: Negative.   ?Genitourinary: Negative.   ?Musculoskeletal:  Positive for arthralgias (right knee). Negative for back pain.  ?Skin: Negative.   ?Allergic/Immunologic: Negative.   ?Neurological:  Positive for tremors. Negative for dizziness and light-headedness.  ?Hematological:  Negative for adenopathy. Does not bruise/bleed easily.  ?Psychiatric/Behavioral:  Negative for dysphoric mood and sleep disturbance (sleeping on 2 pillows). The patient is not nervous/anxious.   ? ?Vitals:  ? 02/01/22 1551  ?BP: (!) 142/93  ?Pulse: 98  ?Resp: 20  ?SpO2: 97%  ?Weight: 207 lb 2 oz (94 kg)  ?Height: '5\' 10"'$  (1.778 m)  ? ?Wt Readings from Last 3 Encounters:  ?02/01/22 207 lb 2 oz (94 kg)  ?01/03/22 203 lb 9.6 oz (92.4 kg)  ?12/28/21 201 lb 5 oz (91.3 kg)  ? ?Lab Results  ?Component Value Date  ? CREATININE 0.66 (L) 01/03/2022  ? CREATININE 0.65 12/25/2021  ? CREATININE 0.71 12/24/2021  ? ?Physical Exam ?Vitals and nursing note reviewed.  ?Constitutional:   ?   Appearance: Normal appearance.  ?HENT:  ?   Head: Normocephalic and atraumatic.  ?   Right Ear: Decreased hearing noted.  ?   Left Ear: Decreased hearing noted.  ?Neck:  ?   Vascular: No JVD.  ?Cardiovascular:  ?   Rate and Rhythm: Normal rate and regular rhythm.  ?Pulmonary:  ?   Effort: Pulmonary effort is normal.  ?   Breath sounds: No wheezing, rhonchi or rales.  ?Abdominal:  ?   General: There is no distension.  ?   Palpations: Abdomen is soft.  ?   Tenderness: There is no abdominal tenderness.  ?Musculoskeletal:     ?   General: Swelling (right knee) present. No tenderness.  ?   Cervical  back: Normal range of motion and neck supple.  ?   Right lower leg: No edema.  ?   Left lower leg: No edema.  ?Skin: ?   General: Skin is warm and dry.  ?Neurological:  ?   General: No focal deficit present.  ?   Mental Status: He is alert and oriented to person, place, and time.  ?   Motor: Tremor (fine tremors in hands) present.  ?Psychiatric:     ?   Mood and Affect: Mood normal.     ?   Behavior: Behavior normal.  ?  ?Assessment & Plan: ? ?1. 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 (LVH)- ?- NYHA class II ?- euvolemic today ?- weighing daily; reminded to call for an overnight weight gain of > 2 pounds or a weekly  weight gain of > 5 pounds ?- weight up 6 pounds (207.2) from last visit here 5 weeks ago ?- not adding salt and reading food labels for sodium content ?- begin jardiance '10mg'$  daily; 30 day voucher provided ?- will ask PCP to check labs in a few weeks ?- may be able to decrease furosemide at next visit ?- transition lisinopril to entresto at next visit if able ?- BNP 12/21/21 was 209.2 ? ?2: HTN- ?- BP mildly elevated (142/93) ?- saw PCP Mathis Dad) 01/03/22 ?- BMP from 12/25/21 reviewed and showed sodium 136, potassium 3.6, creatinine 0.65 and GFR >60 ? ?3: Tobacco use-  ?- says that he's smoking ~ 1-2 cigars daily  ?- complete cessation discussed for 2 minutes with him ? ?4: Chronic alcohol use- ?- drinks 1/2-1 pint of vodka daily in addition to beer although says that he hasn't had any alcohol yet today ?- fine hand tremors noted  ? ? ?Patient did not bring hisr medications nor a list. Each medication was verbally reviewed with the patient and he was encouraged to bring the bottles to every visit to confirm accuracy of list.  ? ?Return in 1 month, sooner if needed.  ? ? ? ? ? ? ? ? ? ? ? ? ? ? ?  ?

## 2022-02-01 ENCOUNTER — Ambulatory Visit: Payer: BC Managed Care – PPO | Attending: Family | Admitting: Family

## 2022-02-01 ENCOUNTER — Encounter: Payer: Self-pay | Admitting: Family

## 2022-02-01 ENCOUNTER — Other Ambulatory Visit: Payer: Self-pay

## 2022-02-01 VITALS — BP 142/93 | HR 98 | Resp 20 | Ht 70.0 in | Wt 207.1 lb

## 2022-02-01 DIAGNOSIS — F17290 Nicotine dependence, other tobacco product, uncomplicated: Secondary | ICD-10-CM | POA: Insufficient documentation

## 2022-02-01 DIAGNOSIS — I5032 Chronic diastolic (congestive) heart failure: Secondary | ICD-10-CM | POA: Insufficient documentation

## 2022-02-01 DIAGNOSIS — I10 Essential (primary) hypertension: Secondary | ICD-10-CM

## 2022-02-01 DIAGNOSIS — F17200 Nicotine dependence, unspecified, uncomplicated: Secondary | ICD-10-CM

## 2022-02-01 DIAGNOSIS — I110 Hypertensive heart disease with heart failure: Secondary | ICD-10-CM | POA: Diagnosis not present

## 2022-02-01 DIAGNOSIS — J449 Chronic obstructive pulmonary disease, unspecified: Secondary | ICD-10-CM | POA: Diagnosis not present

## 2022-02-01 DIAGNOSIS — Z8249 Family history of ischemic heart disease and other diseases of the circulatory system: Secondary | ICD-10-CM | POA: Diagnosis not present

## 2022-02-01 DIAGNOSIS — E785 Hyperlipidemia, unspecified: Secondary | ICD-10-CM | POA: Diagnosis not present

## 2022-02-01 DIAGNOSIS — F1010 Alcohol abuse, uncomplicated: Secondary | ICD-10-CM | POA: Diagnosis not present

## 2022-02-01 DIAGNOSIS — Z79899 Other long term (current) drug therapy: Secondary | ICD-10-CM | POA: Insufficient documentation

## 2022-02-01 MED ORDER — EMPAGLIFLOZIN 10 MG PO TABS: 10.0000 mg | ORAL_TABLET | Freq: Every day | ORAL | 5 refills | Status: DC

## 2022-02-01 NOTE — Patient Instructions (Addendum)
Continue weighing daily and call for an overnight weight gain of 3 pounds or more or a weekly weight gain of more than 5 pounds. ? ?If you have voicemail, please make sure your mailbox is cleaned out so that we may leave a message and please make sure to listen to any voicemails.  ? ? ? ?Take jardiance coupon to the pharmacy and you will get the first month free of charge. You will take this as 1 tablet every morning ?

## 2022-02-18 ENCOUNTER — Encounter: Payer: Self-pay | Admitting: *Deleted

## 2022-02-18 ENCOUNTER — Encounter: Payer: Self-pay | Admitting: Pulmonary Disease

## 2022-02-18 ENCOUNTER — Ambulatory Visit (INDEPENDENT_AMBULATORY_CARE_PROVIDER_SITE_OTHER): Payer: BC Managed Care – PPO | Admitting: Pulmonary Disease

## 2022-02-18 VITALS — BP 100/70 | HR 85 | Temp 98.2°F | Ht 70.0 in | Wt 207.6 lb

## 2022-02-18 DIAGNOSIS — J449 Chronic obstructive pulmonary disease, unspecified: Secondary | ICD-10-CM | POA: Diagnosis not present

## 2022-02-18 DIAGNOSIS — F1010 Alcohol abuse, uncomplicated: Secondary | ICD-10-CM

## 2022-02-18 DIAGNOSIS — R569 Unspecified convulsions: Secondary | ICD-10-CM | POA: Diagnosis not present

## 2022-02-18 DIAGNOSIS — F17200 Nicotine dependence, unspecified, uncomplicated: Secondary | ICD-10-CM

## 2022-02-18 MED ORDER — TRELEGY ELLIPTA 100-62.5-25 MCG/ACT IN AEPB: 1.0000 | INHALATION_SPRAY | Freq: Every day | RESPIRATORY_TRACT | 0 refills | Status: DC

## 2022-02-18 MED ORDER — TRELEGY ELLIPTA 100-62.5-25 MCG/ACT IN AEPB: 1.0000 | INHALATION_SPRAY | Freq: Every day | RESPIRATORY_TRACT | 11 refills | Status: DC

## 2022-02-18 NOTE — Patient Instructions (Signed)
We are going to get some breathing test that will tell us about your lungs. ? ?We have provided you with a sample of an inhaler called Trelegy Ellipta.  Take 1 puff every day pretty much at the same time.  Make sure that you rinse your mouth well after you use it.  A prescription was sent to your pharmacy.  You have a coupon to help you pay for it. ? ?We are referring you to a neurologist to follow-up on your seizures. ? ?Continue using your oxygen at nighttime. ? ?I highly recommend that you stop drinking alcohol and stop using tobacco products (cigars/cigarettes). ? ?We will see you in follow-up in 2 months time call sooner should any new problems arise. ?

## 2022-02-18 NOTE — Progress Notes (Signed)
? ?Subjective:  ? ? Patient ID: Jose Dakins., male    DOB: [birth year], 51 y.o.   MRN: [id number] ?Patient Care Team: ?Jon Billings, NP as PCP - General ?Greg Cutter, LCSW as Education officer, museum ([employer]) ? ?Chief Complaint  ?Patient presents with  ? Consult  ?  Hx of COPD.  Has had breathing tests in past.  Increase in sob with increase in temperature and change in weather.    ? ?HPI ?The patient is a 51 year old current cigar smoker with a 33-pack-year history of cigarette smoking who presents for evaluation of shortness of breath present for approximately 3 years particularly during exertion.  He is kindly referred by Jon Billings, NP.  The patient states that he has had shortness of breath particularly while working for approximately 3 years.  Long distance walks and exerting himself picking up heavy objects etc. aggravate the symptoms.  Rest helps the symptoms.  He uses albuterol inhaler which also helps.  Was admitted on 21 December 2021 to [facility] with acute hypoxic respiratory failure requiring BiPAP.  At that time he was treated both for diastolic heart failure and COPD exacerbation.  Initially was discharged on home oxygen persistent desaturations.  He has weaned himself off of oxygen during the day however still uses it at night.  He does not endorse any cough or sputum production.  No hemoptysis.  No weight loss or anorexia. ? ?As an aside, he has a history of seizures and is apparently on Tegretol.  He does not follow with neurology for this issue.  States that he had a breakthrough seizure several days back but did not seek medical help. ? ?He does drink alcohol daily and this will of course lower the seizure threshold.  This was explained to the patient. ? ?Review of Systems ?A 10 point review of systems was performed and it is as noted above otherwise negative. ? ?Past Medical History:  ?Diagnosis Date  ? Alcohol abuse   ? Cervicalgia   ? CHF (congestive heart failure)  ([place])   ? Hyperlipidemia   ? Hypertension   ? Seizures ([place])   ? ?Past Surgical History:  ?Procedure Laterality Date  ? COLONOSCOPY WITH PROPOFOL N/A 01/16/2018  ? Procedure: COLONOSCOPY WITH PROPOFOL;  Surgeon: Lin Landsman, MD;  Location: [facility] ENDOSCOPY;  Service: Gastroenterology;  Laterality: N/A;  ? ESOPHAGOGASTRODUODENOSCOPY (EGD) WITH PROPOFOL N/A 01/16/2018  ? Procedure: ESOPHAGOGASTRODUODENOSCOPY (EGD) WITH PROPOFOL;  Surgeon: Lin Landsman, MD;  Location: [facility] ENDOSCOPY;  Service: Gastroenterology;  Laterality: N/A;  ? ?Patient Active Problem List  ? Diagnosis Date Noted  ? Acute CHF (congestive heart failure) ([place]) 12/21/2021  ? Alcoholic intoxication without complication ([place]) 91/63/8466  ? Chronic diastolic congestive heart failure ([place]) 09/08/2020  ? Iron deficiency anemia due to chronic blood loss   ? Family history of colon cancer 06/28/2016  ? Seizures ([place]) 04/30/2015  ? Hypertension 04/30/2015  ? Chronic alcohol abuse 04/30/2015  ? Epilepsy ([place]) 04/30/2015  ? Tobacco dependence 04/30/2015  ? COPD (chronic obstructive pulmonary disease) ([place]) 04/30/2015  ? Uncomplicated asthma 59/93/5701  ? Lumbago 04/30/2015  ? Hypercholesterolemia 04/30/2015  ? Chronic pain 04/30/2015  ? Neck pain 04/22/2013  ? ?Family History  ?Problem Relation Age of Onset  ? Diabetes Mother   ? Colon cancer Mother   ? Hypertension Father   ? Multiple sclerosis Father   ? ?Social History  ? ?Tobacco Use  ? Smoking status: Every Day  ?  Packs/day:  1.00  ?  Years: 33.00  ?  Pack years: 33.00  ?  Types: Cigars, Cigarettes  ?  Start date: 11  ?  Last attempt to quit: 2020  ?  Years since quitting: 3.3  ? Smokeless tobacco: Former  ? Tobacco comments:  ?  2 cigars a day 02/18/22 hfb  ?  Quit smoking cigarettes 3 years ago.  Smokes cigarettes since he was 51 years old.  ?Substance Use Topics  ? Alcohol use: Yes  ?  Alcohol/week: 28.0 standard drinks  ?  Types: 7 Cans of beer, 21 Shots of liquor per week  ?  Comment: half  pint liquor per day  ? ?No Known Allergies ? ?Current Meds  ?Medication Sig  ? albuterol (VENTOLIN HFA) 108 (90 Base) MCG/ACT inhaler Inhale 2 puffs into the lungs every 6 (six) hours as needed for wheezing or shortness of breath.  ? amLODipine (NORVASC) 10 MG tablet Take 1 tablet (10 mg total) by mouth daily.  ? carvedilol (COREG) 25 MG tablet Take 1 tablet (25 mg total) by mouth 2 (two) times daily with a meal.  ? empagliflozin (JARDIANCE) 10 MG TABS tablet Take 1 tablet (10 mg total) by mouth daily before breakfast.  ? furosemide (LASIX) 40 MG tablet Take 1 tablet (40 mg total) by mouth daily. Increased from 20 mg daily.  ? hydrALAZINE (APRESOLINE) 50 MG tablet Take 1 tablet (50 mg total) by mouth 3 (three) times daily.  ? isosorbide mononitrate (IMDUR) 30 MG 24 hr tablet Take 1 tablet (30 mg total) by mouth daily.  ? lisinopril (ZESTRIL) 40 MG tablet Take 1 tablet (40 mg total) by mouth daily.  ? potassium chloride (MICRO-K) 10 MEQ CR capsule Take 20 mEq by mouth daily.  ? rosuvastatin (CRESTOR) 20 MG tablet Take 1 tablet (20 mg total) by mouth daily.  ? ?Immunization History  ?Administered Date(s) Administered  ? Influenza-Unspecified 08/22/2019  ? PFIZER(Purple Top)SARS-COV-2 Vaccination 09/14/2020, 10/19/2020  ? ? ?   ?Objective:  ? Physical Exam ?BP 100/70 (BP Location: Left Arm, Patient Position: Sitting, Cuff Size: Large)   Pulse 85   Temp 98.2 ?F (36.8 ?C) (Oral)   Ht '5\' 10"'$  (1.778 m)   Wt 207 lb 9.6 oz (94.2 kg)   SpO2 97%   BMI 29.79 kg/m?  ?GENERAL: Well-developed, overweight gentleman, no acute distress, no gait disturbance, very nasal quality to speech.  No conversational dyspnea. ?HEAD: Normocephalic, atraumatic.  ?EYES: Pupils equal, round, reactive to light.  No scleral icterus.  ?MOUTH: Poor dentition, oral mucosa moist. ?NECK: Supple. No thyromegaly. Trachea midline. No JVD.  No adenopathy. ?PULMONARY: Good air entry bilaterally.  Wheezing and rhonchi throughout. ?CARDIOVASCULAR: S1 and  S2. Regular rate and rhythm.  No rubs, murmurs or gallops heard. ?ABDOMEN: Benign. ?MUSCULOSKELETAL: No joint deformity, mild early clubbing, no edema.  ?NEUROLOGIC: No overt focal deficit, no gait disturbance, speech is fluent. ?SKIN: Intact,warm,dry. ?PSYCH: Mood and behavior normal. ? ? ?Ambulatory oximetry was performed today: At rest heart rate was 79 bpm oxygen saturation of 98%, during exercise heart rate peaked at 98 bpm oxygen saturations remained between 98 to 99%.  The patient was only able to do 2 laps (500 feet) came more short of breath after the second lap however no oxygen desaturations were noted. ?   ?Assessment & Plan:  ? ?  ICD-10-CM   ?1. COPD suggested by initial evaluation [facility])  J44.9 Pulmonary Function Test [facility] Only  ?  ?2. Seizures ([place])  R56.9  Ambulatory referral to Neurology  ? Currently not getting follow-up for this ?Apparently on Tegretol 200 mg twice daily ?Will refer to neurology  ?  ?3. Alcohol abuse  F10.10   ? Patient counseled with regards to abstaining from alcohol ?Recommend counseling for this ?This issue may aggravate his seizure issues  ?  ?4. Tobacco dependence  F17.200   ? Patient counseled with regards to discontinuation of smoking  ?  ? ?Orders Placed This Encounter  ?Procedures  ? Ambulatory referral to Neurology  ?  Referral Priority:   Routine  ?  Referral Type:   Consultation  ?  Referral Reason:   Specialty Services Required  ?  Referred to Provider:   Vladimir Crofts, MD  ?  Requested Specialty:   Neurology  ?  Number of Visits Requested:   1  ? Pulmonary Function Test [facility] Only  ?  Standing Status:   Future  ?  Standing Expiration Date:   02/19/2023  ?  Order Specific Question:   Full PFT: includes the following: basic spirometry, spirometry pre & post bronchodilator, diffusion capacity (DLCO), lung volumes  ?  Answer:   Full PFT  ? ?Meds ordered this encounter  ?Medications  ? Fluticasone-Umeclidin-Vilant (TRELEGY ELLIPTA) 100-62.5-25 MCG/ACT AEPB  ?  Sig: Inhale  1 puff into the lungs daily.  ?  Dispense:  28 each  ?  Refill:  11  ? Fluticasone-Umeclidin-Vilant (TRELEGY ELLIPTA) 100-62.5-25 MCG/ACT AEPB  ?  Sig: Inhale 1 puff into the lungs daily.  ?  Dispense:  14 each

## 2022-02-24 ENCOUNTER — Ambulatory Visit (INDEPENDENT_AMBULATORY_CARE_PROVIDER_SITE_OTHER): Payer: BC Managed Care – PPO | Admitting: Nurse Practitioner

## 2022-02-24 ENCOUNTER — Encounter: Payer: Self-pay | Admitting: Nurse Practitioner

## 2022-02-24 VITALS — BP 136/83 | HR 90 | Temp 98.4°F | Wt 207.2 lb

## 2022-02-24 DIAGNOSIS — I10 Essential (primary) hypertension: Secondary | ICD-10-CM

## 2022-02-24 DIAGNOSIS — G40909 Epilepsy, unspecified, not intractable, without status epilepticus: Secondary | ICD-10-CM

## 2022-02-24 DIAGNOSIS — D500 Iron deficiency anemia secondary to blood loss (chronic): Secondary | ICD-10-CM

## 2022-02-24 DIAGNOSIS — F10920 Alcohol use, unspecified with intoxication, uncomplicated: Secondary | ICD-10-CM

## 2022-02-24 DIAGNOSIS — J449 Chronic obstructive pulmonary disease, unspecified: Secondary | ICD-10-CM

## 2022-02-24 DIAGNOSIS — R7303 Prediabetes: Secondary | ICD-10-CM | POA: Diagnosis not present

## 2022-02-24 DIAGNOSIS — I5032 Chronic diastolic (congestive) heart failure: Secondary | ICD-10-CM

## 2022-02-24 DIAGNOSIS — I509 Heart failure, unspecified: Secondary | ICD-10-CM | POA: Diagnosis not present

## 2022-02-24 DIAGNOSIS — G8929 Other chronic pain: Secondary | ICD-10-CM

## 2022-02-24 DIAGNOSIS — M25512 Pain in left shoulder: Secondary | ICD-10-CM

## 2022-02-24 NOTE — Progress Notes (Signed)
? ?BP 136/83   Pulse 90   Temp 98.4 ?F (36.9 ?C) (Oral)   Wt 207 lb 3.2 oz (94 kg)   SpO2 95%   BMI 29.73 kg/m?   ? ?Subjective:  ? ? Patient ID: Jose Dakins., male    DOB: [birth year], 51 y.o.   MRN: [id number] ? ?HPI: ?Jose Holdren. is a 51 y.o. male ? ?Chief Complaint  ?Patient presents with  ? Congestive Heart Failure  ?  No questions or concerns at this time   ? ?HYPERTENSION ?Hypertension status: controlled  ?Satisfied with current treatment? yes ?Duration of hypertension: years ?BP monitoring frequency:  not checking ?BP range:  ?BP medication side effects:  no ?Medication compliance: excellent compliance ?Previous BP meds: hydralazine, amlodipine, carvedilol, and lisinopril ?Aspirin: no ?Recurrent headaches: no ?Visual changes: no ?Palpitations: no ?Dyspnea: yes ?Chest pain: no ?Lower extremity edema: no ?Dizzy/lightheaded: no ? ?COPD ?COPD status: stable ?Satisfied with current treatment?: yes ?Oxygen use: no ?Dyspnea frequency: yes ?Cough frequency:  ?Rescue inhaler frequency:   ?Limitation of activity: yes ?Productive cough:  ?Last Spirometry:  ?Pneumovax: Up to Date ?Influenza: Up to Date ? ?CHF ?Patient is followed by Darylene Price yesterday.   ? ?Patient states he is drinking about a half to 1 pint per day.  Which is less than he was previously drinking.   ? ?Patient is having left shoulder pain.  States after he fell in December he has been having a lot of pain.  Over the last couple of months it has gotten worse.   ? ? ?Relevant past medical, surgical, family and social history reviewed and updated as indicated. Interim medical history since our last visit reviewed. ?Allergies and medications reviewed and updated. ? ?Review of Systems  ?Eyes:  Negative for visual disturbance.  ?Respiratory:  Positive for shortness of breath.   ?Cardiovascular:  Negative for chest pain and leg swelling.  ?Musculoskeletal:   ?     Left shoulder pain  ?Neurological:  Negative for light-headedness and headaches.   ? ?Per HPI unless specifically indicated above ? ?   ?Objective:  ?  ?BP 136/83   Pulse 90   Temp 98.4 ?F (36.9 ?C) (Oral)   Wt 207 lb 3.2 oz (94 kg)   SpO2 95%   BMI 29.73 kg/m?   ?Wt Readings from Last 3 Encounters:  ?02/24/22 207 lb 3.2 oz (94 kg)  ?02/18/22 207 lb 9.6 oz (94.2 kg)  ?02/01/22 207 lb 2 oz (94 kg)  ?  ?Physical Exam ?Vitals and nursing note reviewed.  ?Constitutional:   ?   General: He is not in acute distress. ?   Appearance: Normal appearance. He is not ill-appearing, toxic-appearing or diaphoretic.  ?HENT:  ?   Head: Normocephalic.  ?   Right Ear: External ear normal.  ?   Left Ear: External ear normal.  ?   Nose: Nose normal. No congestion or rhinorrhea.  ?   Mouth/Throat:  ?   Mouth: Mucous membranes are moist.  ?Eyes:  ?   General:     ?   Right eye: No discharge.     ?   Left eye: No discharge.  ?   Extraocular Movements: Extraocular movements intact.  ?   Conjunctiva/sclera: Conjunctivae normal.  ?   Pupils: Pupils are equal, round, and reactive to light.  ?Cardiovascular:  ?   Rate and Rhythm: Normal rate and regular rhythm.  ?   Heart sounds: No murmur heard. ?Pulmonary:  ?  Effort: Pulmonary effort is normal. No respiratory distress.  ?   Breath sounds: Normal breath sounds. No wheezing, rhonchi or rales.  ?Abdominal:  ?   General: Abdomen is flat. Bowel sounds are normal.  ?Musculoskeletal:  ?   Right shoulder: Normal.  ?   Left shoulder: Deformity, tenderness and bony tenderness present. Decreased range of motion. Decreased strength.  ?     Arms: ? ?   Cervical back: Normal range of motion and neck supple.  ?Skin: ?   General: Skin is warm and dry.  ?   Capillary Refill: Capillary refill takes less than 2 seconds.  ?Neurological:  ?   General: No focal deficit present.  ?   Mental Status: He is alert and oriented to person, place, and time.  ?Psychiatric:     ?   Mood and Affect: Mood normal.     ?   Behavior: Behavior normal.     ?   Thought Content: Thought content normal.      ?   Judgment: Judgment normal.  ? ? ?Results for orders placed or performed in visit on 02/24/22  ?CBC w/Diff  ?Result Value Ref Range  ? WBC 7.5 3.4 - 10.8 x10E3/uL  ? RBC 3.66 (L) 4.14 - 5.80 x10E6/uL  ? Hemoglobin 11.5 (L) 13.0 - 17.7 g/dL  ? Hematocrit 32.7 (L) 37.5 - 51.0 %  ? MCV 89 79 - 97 fL  ? MCH 31.4 26.6 - 33.0 pg  ? MCHC 35.2 31.5 - 35.7 g/dL  ? RDW 14.5 11.6 - 15.4 %  ? Platelets 118 (L) 150 - 450 x10E3/uL  ? Neutrophils 65 Not Estab. %  ? Lymphs 19 Not Estab. %  ? Monocytes 13 Not Estab. %  ? Eos 2 Not Estab. %  ? Basos 1 Not Estab. %  ? Neutrophils Absolute 4.9 1.4 - 7.0 x10E3/uL  ? Lymphocytes Absolute 1.4 0.7 - 3.1 x10E3/uL  ? Monocytes Absolute 1.0 (H) 0.1 - 0.9 x10E3/uL  ? EOS (ABSOLUTE) 0.1 0.0 - 0.4 x10E3/uL  ? Basophils Absolute 0.0 0.0 - 0.2 x10E3/uL  ? Immature Granulocytes 0 Not Estab. %  ? Immature Grans (Abs) 0.0 0.0 - 0.1 x10E3/uL  ? NRBC 1 (H) 0 - 0 %  ?Basic Metabolic Panel (BMET)  ?Result Value Ref Range  ? Glucose 94 70 - 99 mg/dL  ? BUN 12 6 - 24 mg/dL  ? Creatinine, Ser 0.67 (L) 0.76 - 1.27 mg/dL  ? eGFR 112 >59 mL/min/1.73  ? BUN/Creatinine Ratio 18 9 - 20  ? Sodium 140 134 - 144 mmol/L  ? Potassium 4.0 3.5 - 5.2 mmol/L  ? Chloride 101 96 - 106 mmol/L  ? CO2 22 20 - 29 mmol/L  ? Calcium 9.2 8.7 - 10.2 mg/dL  ?HgB A1c  ?Result Value Ref Range  ? Hgb A1c MFr Bld 5.7 (H) 4.8 - 5.6 %  ? Est. average glucose Bld gHb Est-mCnc 117 mg/dL  ? ?   ?Assessment & Plan:  ? ?Problem List Items Addressed This Visit   ? ?  ? Cardiovascular and Mediastinum  ? Hypertension  ?  Chronic.  Controlled.  Continue with current medication regimen.  Continue to collaborate with Cardiology.  Labs ordered today.  Return to clinic in 3 months for reevaluation.  Call sooner if concerns arise.  ? ? ?  ?  ? Chronic diastolic congestive heart failure ([place]) - Primary  ?  Chronic.  Controlled. Patient followed up with Heart Failure clinic  yesterday. Patient endorses taking all Cardiac medications.  Continue with  follow up with HF clinic.  Labs ordered today.  Appears Euvolemic.  Follow up in 3 months. Call sooner if concerns arise. ? ?- Reminded to call for an overnight weight gain of >2 pounds or a weekly weight gain of >5 pounds ?- not adding salt to food and read food labels. Reviewed the importance of keeping daily sodium intake to <2068m daily. ?- Avoid Ibuprofen products. ? ?  ?  ? Relevant Orders  ? CBC w/Diff (Completed)  ? Basic Metabolic Panel (BMET) (Completed)  ?  ? Respiratory  ? COPD (chronic obstructive pulmonary disease) ([place]  ?  Chronic. Saw Pulmonology on 02/18/2022 who started him on Trellegy and ordered PFTs.  Patient will follow up with them in 2 months.   Patient endorses proper use of Trellegy. Follow up in 3 months for reevaluation. ? ?  ?  ?  ? Nervous and Auditory  ? Epilepsy ([place]  ?  Chronic. Has not followed up with Neurology.  Will work to make an appointment for patient.  Will call patient and notify him of appointment date and time.   ? ?  ?  ?  ? Other  ? Iron deficiency anemia due to chronic blood loss  ?  Chronic. Labs ordered today. Will make recommendations based on lab results.  ? ?  ?  ? Relevant Orders  ? CBC w/Diff (Completed)  ? 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  Chronic. Drinking 1/2pint to a whole pint daily.  Encouraged patient to seek help for alcohol cessation.  Patient is not ready to quit at this time.  ? ?  ?  ? Relevant Orders  ? Basic Metabolic Panel (BMET) (Completed)  ? ?Other Visit Diagnoses   ? ? Prediabetes      ? Labs ordered today. Will make recommendations based on lab results.   ? Relevant Orders  ? HgB A1c (Completed)  ? Chronic left shoulder pain      ? Ongoing since fall in December. Painful to palpation. Will order xray. Will make recommendations based on lab results.   ? Relevant Orders  ? DG Shoulder Left  ? ?  ?  ? ?Follow up plan: ?Return in about 3 months (around 05/26/2022) for HTN, HLD, DM2 FU. ? ? ? ? ? ? ?

## 2022-02-25 LAB — HEMOGLOBIN A1C
Est. average glucose Bld gHb Est-mCnc: 117 mg/dL
Hgb A1c MFr Bld: 5.7 % — ABNORMAL HIGH (ref 4.8–5.6)

## 2022-02-25 LAB — CBC WITH DIFFERENTIAL/PLATELET
Basophils Absolute: 0 10*3/uL (ref 0.0–0.2)
Basos: 1 %
EOS (ABSOLUTE): 0.1 10*3/uL (ref 0.0–0.4)
Eos: 2 %
Hematocrit: 32.7 % — ABNORMAL LOW (ref 37.5–51.0)
Hemoglobin: 11.5 g/dL — ABNORMAL LOW (ref 13.0–17.7)
Immature Grans (Abs): 0 10*3/uL (ref 0.0–0.1)
Immature Granulocytes: 0 %
Lymphocytes Absolute: 1.4 10*3/uL (ref 0.7–3.1)
Lymphs: 19 %
MCH: 31.4 pg (ref 26.6–33.0)
MCHC: 35.2 g/dL (ref 31.5–35.7)
MCV: 89 fL (ref 79–97)
Monocytes Absolute: 1 10*3/uL — ABNORMAL HIGH (ref 0.1–0.9)
Monocytes: 13 %
NRBC: 1 % — ABNORMAL HIGH (ref 0–0)
Neutrophils Absolute: 4.9 10*3/uL (ref 1.4–7.0)
Neutrophils: 65 %
Platelets: 118 10*3/uL — ABNORMAL LOW (ref 150–450)
RBC: 3.66 x10E6/uL — ABNORMAL LOW (ref 4.14–5.80)
RDW: 14.5 % (ref 11.6–15.4)
WBC: 7.5 10*3/uL (ref 3.4–10.8)

## 2022-02-25 LAB — BASIC METABOLIC PANEL
BUN/Creatinine Ratio: 18 (ref 9–20)
BUN: 12 mg/dL (ref 6–24)
CO2: 22 mmol/L (ref 20–29)
Calcium: 9.2 mg/dL (ref 8.7–10.2)
Chloride: 101 mmol/L (ref 96–106)
Creatinine, Ser: 0.67 mg/dL — ABNORMAL LOW (ref 0.76–1.27)
Glucose: 94 mg/dL (ref 70–99)
Potassium: 4 mmol/L (ref 3.5–5.2)
Sodium: 140 mmol/L (ref 134–144)
eGFR: 112 mL/min/{1.73_m2} (ref >59)

## 2022-02-25 NOTE — Assessment & Plan Note (Signed)
Chronic.  Controlled.  Continue with current medication regimen.  Continue to collaborate with Cardiology.  Labs ordered today.  Return to clinic in 3 months for reevaluation.  Call sooner if concerns arise.   

## 2022-02-25 NOTE — Progress Notes (Signed)
Please let patient know that his lab work shows that his lab work shows his blood counts have dropped from our prior visit.  I ordered further labs to investigate this further.  I would like to find out what type of anemia and get a stool sample to see if it is related to the GI tract.  I ordered the labs.  Please have him come in and get these done.

## 2022-02-25 NOTE — Assessment & Plan Note (Signed)
Chronic. Drinking 1/2pint to a whole pint daily.  Encouraged patient to seek help for alcohol cessation.  Patient is not ready to quit at this time.  ?

## 2022-02-25 NOTE — Assessment & Plan Note (Signed)
Chronic. Saw Pulmonology on 02/18/2022 who started him on Trellegy and ordered PFTs.  Patient will follow up with them in 2 months.   Patient endorses proper use of Trellegy. Follow up in 3 months for reevaluation. ?

## 2022-02-25 NOTE — Assessment & Plan Note (Signed)
Chronic.  Controlled. Patient followed up with Heart Failure clinic yesterday. Patient endorses taking all Cardiac medications.  Continue with follow up with HF clinic.  Labs ordered today.  Appears Euvolemic.  Follow up in 3 months. Call sooner if concerns arise. ? ?- Reminded to call for an overnight weight gain of >2 pounds or a weekly weight gain of >5 pounds ?- not adding salt to food and read food labels. Reviewed the importance of keeping daily sodium intake to '2000mg'$  daily. ?- Avoid Ibuprofen products. ? ?

## 2022-02-25 NOTE — Assessment & Plan Note (Signed)
Chronic. Has not followed up with Neurology.  Will work to make an appointment for patient.  Will call patient and notify him of appointment date and time.   ?

## 2022-02-25 NOTE — Assessment & Plan Note (Signed)
Chronic.  Labs ordered today. Will make recommendations based on lab results.  

## 2022-02-25 NOTE — Addendum Note (Signed)
Addended by: Jon Billings on: 02/25/2022 01:00 PM ? ? Modules accepted: Orders ? ?

## 2022-02-28 ENCOUNTER — Ambulatory Visit
Admission: RE | Admit: 2022-02-28 | Discharge: 2022-02-28 | Disposition: A | Discharge status: Home / Self Care | Payer: BC Managed Care – PPO | Source: Ambulatory Visit | Attending: Nurse Practitioner | Admitting: Nurse Practitioner

## 2022-02-28 ENCOUNTER — Other Ambulatory Visit: Payer: BC Managed Care – PPO

## 2022-02-28 ENCOUNTER — Ambulatory Visit
Admission: RE | Admit: 2022-02-28 | Discharge: 2022-02-28 | Disposition: A | Discharge status: Home / Self Care | Payer: BC Managed Care – PPO | Source: Home / Self Care | Attending: Nurse Practitioner | Admitting: Nurse Practitioner

## 2022-02-28 DIAGNOSIS — G8929 Other chronic pain: Secondary | ICD-10-CM | POA: Insufficient documentation

## 2022-02-28 DIAGNOSIS — M25512 Pain in left shoulder: Secondary | ICD-10-CM | POA: Insufficient documentation

## 2022-02-28 DIAGNOSIS — I5032 Chronic diastolic (congestive) heart failure: Secondary | ICD-10-CM | POA: Diagnosis not present

## 2022-02-28 DIAGNOSIS — D500 Iron deficiency anemia secondary to blood loss (chronic): Secondary | ICD-10-CM

## 2022-02-28 DIAGNOSIS — M19012 Primary osteoarthritis, left shoulder: Secondary | ICD-10-CM | POA: Diagnosis not present

## 2022-03-01 ENCOUNTER — Encounter: Payer: Self-pay | Admitting: Family

## 2022-03-01 ENCOUNTER — Ambulatory Visit: Payer: BC Managed Care – PPO | Attending: Family | Admitting: Family

## 2022-03-01 VITALS — BP 142/88 | HR 87 | Resp 20 | Ht 70.0 in | Wt 211.0 lb

## 2022-03-01 DIAGNOSIS — F17210 Nicotine dependence, cigarettes, uncomplicated: Secondary | ICD-10-CM | POA: Diagnosis not present

## 2022-03-01 DIAGNOSIS — I5032 Chronic diastolic (congestive) heart failure: Secondary | ICD-10-CM | POA: Diagnosis not present

## 2022-03-01 DIAGNOSIS — Z79899 Other long term (current) drug therapy: Secondary | ICD-10-CM | POA: Diagnosis not present

## 2022-03-01 DIAGNOSIS — Z7984 Long term (current) use of oral hypoglycemic drugs: Secondary | ICD-10-CM | POA: Insufficient documentation

## 2022-03-01 DIAGNOSIS — I110 Hypertensive heart disease with heart failure: Secondary | ICD-10-CM | POA: Insufficient documentation

## 2022-03-01 DIAGNOSIS — E785 Hyperlipidemia, unspecified: Secondary | ICD-10-CM | POA: Insufficient documentation

## 2022-03-01 DIAGNOSIS — F1010 Alcohol abuse, uncomplicated: Secondary | ICD-10-CM

## 2022-03-01 DIAGNOSIS — F1090 Alcohol use, unspecified, uncomplicated: Secondary | ICD-10-CM | POA: Insufficient documentation

## 2022-03-01 DIAGNOSIS — J449 Chronic obstructive pulmonary disease, unspecified: Secondary | ICD-10-CM | POA: Diagnosis not present

## 2022-03-01 DIAGNOSIS — R059 Cough, unspecified: Secondary | ICD-10-CM | POA: Insufficient documentation

## 2022-03-01 DIAGNOSIS — I10 Essential (primary) hypertension: Secondary | ICD-10-CM | POA: Diagnosis not present

## 2022-03-01 LAB — ANEMIA PROFILE B
Basophils Absolute: 0.1 10*3/uL (ref 0.0–0.2)
Basos: 1 %
EOS (ABSOLUTE): 0.1 10*3/uL (ref 0.0–0.4)
Eos: 2 %
Ferritin: 87 ng/mL (ref 30–400)
Folate: 2.3 ng/mL — ABNORMAL LOW (ref >3.0)
Hematocrit: 31 % — ABNORMAL LOW (ref 37.5–51.0)
Hemoglobin: 10.5 g/dL — ABNORMAL LOW (ref 13.0–17.7)
Immature Grans (Abs): 0.1 10*3/uL (ref 0.0–0.1)
Immature Granulocytes: 1 %
Iron Saturation: 13 % — ABNORMAL LOW (ref 15–55)
Iron: 39 ug/dL (ref 38–169)
Lymphocytes Absolute: 2.1 10*3/uL (ref 0.7–3.1)
Lymphs: 38 %
MCH: 30.7 pg (ref 26.6–33.0)
MCHC: 33.9 g/dL (ref 31.5–35.7)
MCV: 91 fL (ref 79–97)
Monocytes Absolute: 0.7 10*3/uL (ref 0.1–0.9)
Monocytes: 12 %
NRBC: 1 % — ABNORMAL HIGH (ref 0–0)
Neutrophils Absolute: 2.5 10*3/uL (ref 1.4–7.0)
Neutrophils: 46 %
Platelets: 131 10*3/uL — ABNORMAL LOW (ref 150–450)
RBC: 3.42 x10E6/uL — ABNORMAL LOW (ref 4.14–5.80)
RDW: 14.8 % (ref 11.6–15.4)
Retic Ct Pct: 3.3 % — ABNORMAL HIGH (ref 0.6–2.6)
Total Iron Binding Capacity: 301 ug/dL (ref 250–450)
UIBC: 262 ug/dL (ref 111–343)
Vitamin B-12: 244 pg/mL (ref 232–1245)
WBC: 5.5 10*3/uL (ref 3.4–10.8)

## 2022-03-01 MED ORDER — IRON (FERROUS SULFATE) 325 (65 FE) MG PO TABS: 325.0000 mg | ORAL_TABLET | Freq: Every day | ORAL | 1 refills | Status: DC

## 2022-03-01 NOTE — Progress Notes (Signed)
? Patient ID: Jose Dakins., male    DOB: 10/13/1971, 51 y.o.   MRN: [id number] ? ?Jose [name] is a 51 y/o male with a history of hyperlipidemia, HTN, current tobacco/ alcohol use and chronic heart failure.  ? ?Echo report from 11/05/21 reviewed and showed an EF of 60-65% along with moderate LVH. Echo report from 01/29/2019 reviewed and showed an EF of 60-65% ? ?Admitted 12/21/21 due to SOB due to COPD/HF exacerbations. Initially needed bipap. Weaned off to oxygen upon exertion. Initially given IV lasix with transition to oral diuretics. Given IV solumedrol and antiibiotics. Discharged after 4 days. Was in the ED 11/30/21 due to fatigue, weakness, chills and body aches. Workup unremarkable and Jose [name] was released. Was in the ED 11/04/21 due to seizures due to alcohol use and being out of seizure medication for a couple of days while visiting family. Head CT negative for acute process. Given IV ativan for signs of alcohol withdrawal and admission offered. Patient declined admission and says that Jose [name]'s not interested in stopping drinking. Released with neurology referral.  ? ?Jose [name] presents today for a follow-up visit with a chief complaint of minimal fatigue with moderate exertion. Describes this as chronic in nature. Jose [name] has associated cough, shortness of breath, wheezing and slight weight gain along with this. Jose [name] denies any dizziness, difficulty sleeping, abdominal distention, palpitations, pedal edema or chest pain.  ? ?Takes his morning medicine around 3am as that is when Jose [name] wakes up in the mornings and then takes his 2nd dose of medication before Jose [name] leaves work around 3pm. Then takes his 3rd dose of medication before going to sleep.  ? ?Past Medical History:  ?Diagnosis Date  ? Alcohol abuse   ? Cervicalgia   ? CHF (congestive heart failure) ([place])   ? Hyperlipidemia   ? Hypertension   ? Seizures ([place])   ? ?Past Surgical History:  ?Procedure Laterality Date  ? COLONOSCOPY WITH PROPOFOL N/A 01/16/2018  ? Procedure: COLONOSCOPY WITH  PROPOFOL;  Surgeon: Lin Landsman, MD;  Location: [facility] ENDOSCOPY;  Service: Gastroenterology;  Laterality: N/A;  ? ESOPHAGOGASTRODUODENOSCOPY (EGD) WITH PROPOFOL N/A 01/16/2018  ? Procedure: ESOPHAGOGASTRODUODENOSCOPY (EGD) WITH PROPOFOL;  Surgeon: Lin Landsman, MD;  Location: [facility] ENDOSCOPY;  Service: Gastroenterology;  Laterality: N/A;  ? ?Family History  ?Problem Relation Age of Onset  ? Diabetes Mother   ? Colon cancer Mother   ? Hypertension Father   ? Multiple sclerosis Father   ? ?Social History  ? ?Tobacco Use  ? Smoking status: Every Day  ?  Types: Cigars  ? Smokeless tobacco: Former  ? Tobacco comments:  ?  2 cigars a day 02/18/22 hfb  ?  Quit smoking cigarettes 2-3 years ago.  Smokes cigarettes since Jose [name] was 51 years old.  ?Substance Use Topics  ? Alcohol use: Yes  ?  Alcohol/week: 28.0 standard drinks  ?  Types: 7 Cans of beer, 21 Shots of liquor per week  ?  Comment: half pint liquor per day  ? ?No Known Allergies ? ?Prior to Admission medications   ?Medication Sig Start Date End Date Taking? Authorizing Provider  ?albuterol (VENTOLIN HFA) 108 (90 Base) MCG/ACT inhaler Inhale 2 puffs into the lungs every 6 (six) hours as needed for wheezing or shortness of breath. 12/08/21  Yes Jon Billings, NP  ?amLODipine (NORVASC) 10 MG tablet Take 1 tablet (10 mg total) by mouth daily. 11/30/21  Yes Alisa Graff, FNP  ?carbamazepine (TEGRETOL) 200 MG tablet Take 200 mg  by mouth 2 (two) times daily. 02/16/22  Yes [provider]  ?carvedilol (COREG) 25 MG tablet Take 1 tablet (25 mg total) by mouth 2 (two) times daily with a meal. 11/30/21  Yes Alisa Graff, FNP  ?empagliflozin (JARDIANCE) 10 MG TABS tablet Take 1 tablet (10 mg total) by mouth daily before breakfast. 02/01/22  Yes Alisa Graff, FNP  ?Fluticasone-Umeclidin-Vilant (TRELEGY ELLIPTA) 100-62.5-25 MCG/ACT AEPB Inhale 1 puff into the lungs daily. 02/28/22  Yes Tyler Pita, MD  ?Fluticasone-Umeclidin-Vilant (TRELEGY ELLIPTA)  100-62.5-25 MCG/ACT AEPB Inhale 1 puff into the lungs daily. 02/18/22  Yes Tyler Pita, MD  ?furosemide (LASIX) 40 MG tablet Take 1 tablet (40 mg total) by mouth daily. Increased from 20 mg daily. 12/25/21 03/25/22 Yes Enzo Bi, MD  ?hydrALAZINE (APRESOLINE) 50 MG tablet Take 1 tablet (50 mg total) by mouth 3 (three) times daily. 11/30/21  Yes Alisa Graff, FNP  ?Iron, Ferrous Sulfate, 325 (65 Fe) MG TABS Take 325 mg by mouth daily. 03/01/22  Yes Jon Billings, NP  ?isosorbide mononitrate (IMDUR) 30 MG 24 hr tablet Take 1 tablet (30 mg total) by mouth daily. 12/08/21  Yes Jon Billings, NP  ?lisinopril (ZESTRIL) 40 MG tablet Take 1 tablet (40 mg total) by mouth daily. 11/30/21  Yes Alisa Graff, FNP  ?potassium chloride (MICRO-K) 10 MEQ CR capsule Take 20 mEq by mouth daily.   Yes [provider]  ?rosuvastatin (CRESTOR) 20 MG tablet Take 1 tablet (20 mg total) by mouth daily. 12/08/21  Yes Jon Billings, NP  ? ?Review of Systems  ?Constitutional:  Positive for fatigue (improving). Negative for appetite change and fever.  ?HENT:  Positive for hearing loss and rhinorrhea. Negative for congestion and sore throat.   ?Eyes: Negative.   ?Respiratory:  Positive for cough (productive), shortness of breath and wheezing. Negative for chest tightness.   ?Cardiovascular:  Negative for chest pain, palpitations and leg swelling.  ?Gastrointestinal:  Negative for abdominal distention and abdominal pain.  ?Endocrine: Negative.   ?Genitourinary: Negative.   ?Musculoskeletal:  Positive for arthralgias (right knee). Negative for back pain.  ?Skin: Negative.   ?Allergic/Immunologic: Negative.   ?Neurological:  Negative for dizziness, tremors and light-headedness.  ?Hematological:  Negative for adenopathy. Does not bruise/bleed easily.  ?Psychiatric/Behavioral:  Negative for dysphoric mood and sleep disturbance (sleeping on 2 pillows). The patient is not nervous/anxious.   ? ?Vitals:  ? 03/01/22 1554 03/01/22  1603  ?BP: (!) 152/100 (!) 142/88  ?Pulse: 87   ?Resp: 20   ?SpO2: 97%   ?Weight: 211 lb (95.7 kg)   ?Height: '5\' 10"'$  (1.778 m)   ? ?Wt Readings from Last 3 Encounters:  ?03/01/22 211 lb (95.7 kg)  ?02/24/22 207 lb 3.2 oz (94 kg)  ?02/18/22 207 lb 9.6 oz (94.2 kg)  ? ?Lab Results  ?Component Value Date  ? CREATININE 0.67 (L) 02/24/2022  ? CREATININE 0.66 (L) 01/03/2022  ? CREATININE 0.65 12/25/2021  ? ?Physical Exam ?Vitals and nursing note reviewed.  ?Constitutional:   ?   Appearance: Normal appearance.  ?HENT:  ?   Head: Normocephalic and atraumatic.  ?   Right Ear: Decreased hearing noted.  ?   Left Ear: Decreased hearing noted.  ?Neck:  ?   Vascular: No JVD.  ?Cardiovascular:  ?   Rate and Rhythm: Normal rate and regular rhythm.  ?Pulmonary:  ?   Effort: Pulmonary effort is normal.  ?   Breath sounds: No wheezing, rhonchi or rales.  ?  Abdominal:  ?   General: There is no distension.  ?   Palpations: Abdomen is soft.  ?   Tenderness: There is no abdominal tenderness.  ?Musculoskeletal:     ?   General: Swelling (right knee) present. No tenderness.  ?   Cervical back: Normal range of motion and neck supple.  ?   Right lower leg: No edema.  ?   Left lower leg: No edema.  ?Skin: ?   General: Skin is warm and dry.  ?Neurological:  ?   General: No focal deficit present.  ?   Mental Status: Jose [name] is alert and oriented to person, place, and time.  ?   Motor: No tremor.  ?Psychiatric:     ?   Mood and Affect: Mood normal.     ?   Behavior: Behavior normal.  ?  ?Assessment & Plan: ? ?1. 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 ?2: HTN- ?- BP initially elevated (152/100) but had improved upon recheck with manual cuff (142/88) ?- saw PCP Mathis Dad) 02/24/22 ?- BMP from 02/24/22 reviewed and showed sodium 140, potassium 4

## 2022-03-01 NOTE — Patient Instructions (Signed)
Continue weighing daily and call for an overnight weight gain of 3 pounds or more or a weekly weight gain of more than 5 pounds. ? ? ?If you have voicemail, please make sure your mailbox is cleaned out so that we may leave a message and please make sure to listen to any voicemails.  ? ? ?Bring ALL medications including any over the counter or vitamins to every visit.  ? ? ?

## 2022-03-01 NOTE — Progress Notes (Signed)
Please let patient know that his lab work shows that his blood work shows that his blood counts have decreased again.  He needs to start Ferrous Sulfate '325mg'$  daily.  I have also placed a referral for him to see GI.  We need to find the source of his anemia which they will help with.  Please have him come back in 2 weeks after starting the iron supplement to see if his blood counts improved.

## 2022-03-02 ENCOUNTER — Telehealth: Payer: Self-pay

## 2022-03-02 NOTE — Addendum Note (Signed)
Addended by: Jon Billings on: 03/02/2022 08:00 AM ? ? Modules accepted: Orders ? ?

## 2022-03-02 NOTE — Telephone Encounter (Signed)
I have attempted to contact [facility] to set up f/u appt, have been on hold for about 10 minutes. Will try again.  ?

## 2022-03-02 NOTE — Progress Notes (Signed)
Please let patient know that his shoulder xray shows an abnormality at the [facility] joint.  This is likely from his injury in December.  I would like him to see Orthopedics for further evaluation and treatment.  I have placed this referral.

## 2022-03-07 ENCOUNTER — Ambulatory Visit: Payer: BC Managed Care – PPO | Admitting: Nurse Practitioner

## 2022-03-10 ENCOUNTER — Encounter: Payer: Self-pay | Admitting: Pulmonary Disease

## 2022-03-10 ENCOUNTER — Other Ambulatory Visit: Payer: BC Managed Care – PPO

## 2022-03-10 DIAGNOSIS — D500 Iron deficiency anemia secondary to blood loss (chronic): Secondary | ICD-10-CM

## 2022-03-11 ENCOUNTER — Ambulatory Visit: Payer: Self-pay | Admitting: *Deleted

## 2022-03-11 LAB — ANEMIA PROFILE B
Basophils Absolute: 0.1 10*3/uL (ref 0.0–0.2)
Basos: 1 %
EOS (ABSOLUTE): 0.1 10*3/uL (ref 0.0–0.4)
Eos: 2 %
Ferritin: 116 ng/mL (ref 30–400)
Folate: 5.2 ng/mL (ref >3.0)
Hematocrit: 32.6 % — ABNORMAL LOW (ref 37.5–51.0)
Hemoglobin: 11.1 g/dL — ABNORMAL LOW (ref 13.0–17.7)
Immature Grans (Abs): 0 10*3/uL (ref 0.0–0.1)
Immature Granulocytes: 1 %
Iron Saturation: 18 % (ref 15–55)
Iron: 58 ug/dL (ref 38–169)
Lymphocytes Absolute: 1.4 10*3/uL (ref 0.7–3.1)
Lymphs: 21 %
MCH: 31.4 pg (ref 26.6–33.0)
MCHC: 34 g/dL (ref 31.5–35.7)
MCV: 92 fL (ref 79–97)
Monocytes Absolute: 0.9 10*3/uL (ref 0.1–0.9)
Monocytes: 14 %
Neutrophils Absolute: 4.2 10*3/uL (ref 1.4–7.0)
Neutrophils: 61 %
Platelets: 146 10*3/uL — ABNORMAL LOW (ref 150–450)
RBC: 3.53 x10E6/uL — ABNORMAL LOW (ref 4.14–5.80)
RDW: 15.4 % (ref 11.6–15.4)
Retic Ct Pct: 2.7 % — ABNORMAL HIGH (ref 0.6–2.6)
Total Iron Binding Capacity: 320 ug/dL (ref 250–450)
UIBC: 262 ug/dL (ref 111–343)
Vitamin B-12: 332 pg/mL (ref 232–1245)
WBC: 6.8 10*3/uL (ref 3.4–10.8)

## 2022-03-11 NOTE — Progress Notes (Signed)
Please let patient know that his anemia has improved.  We will continue to monitor this at future appointments.  Please ask him to return the stool test that I ordered.  I will let him know the results once they return.

## 2022-03-11 NOTE — Telephone Encounter (Signed)
?  Chief Complaint: requesting lab results and x ray results, reported seizure yesterday prior to OV. Did not report to PCP during OV ?Symptoms: none now , reports had a seizure yesterday prior to leaving work and felt dizzy then "blacked out" not sure how long and when "woke up" patient was in car with mother coming to [place]  ?Frequency: yesterday x 1  ?Pertinent Negatives: Patient denies symptoms now  ?Disposition: '[]'$ ED /'[]'$ Urgent Care (no appt availability in office) / '[x]'$ Appointment(In office/virtual)/ '[]'$  [facility]/ '[]'$ Home Care/ '[]'$ Refused Recommended Disposition /'[]'$ [facility]/ '[]'$  Follow-up with PCP ?Additional Notes:  ?Patient called to request lab results and x ray results . Reviewed results of x ray on 03/02/22 per K. Mathis Dad, NP and patient reports he has not had a call from Orthopedics referral. Pt verbalized understanding of labs from 03/11/22 message anemia has improved and to return stool test ordered. Please advise if sooner appt needed. Patient reports he has had approx 2-3 seizures in the past year.  ? ? Reason for Disposition ? [1] Seizure lasting < 5 minutes AND [2] history of prior seizure(s) AND [3] taking anticonvulsants ? ?Answer Assessment - Initial Assessment Questions ?1. ONSET: "How long did the seizure last?" (e.g., seconds, minutes)  ?    A few minutes not a "full seizure" blacked out when leaving work ?2. CONTENT: "Describe what happened during the seizure. Did the body become stiff? Was there any jerking?"  ?    Felt it coming on dizzy feeling was at work.  ?3. CIRCUMSTANCE: "What was the person doing when the seizure began?"  ?    Leaving work to come to [employer] ?4. MENTAL STATUS: "Does the person know who they are, who you are, and where they are?"  ?    Feels fine now . Does not remember anything until in the car coming to [place]  ?5. PRIOR SEIZURES: "Has the person had a seizure (convulsion) before?" If Yes, ask: "When was the last time?" and "What happened last time?" ?     Yes happens maybe 2 times a year  ?6. EPILEPSY: "Does the person have epilepsy?" Note: Check for medical ID bracelet. ?    Yes  ?7. MEDICINES: "Does the person take anticonvulsant medications?" (e.g., Yes, No; compliance, any recent changes) ?    Yes has been taking medication ?8. INJURY: "Did the person hurt himself during the seizure?" (e.g., head, tongue) ?    No  ?9. OTHER SYMPTOMS: "Are there any other symptoms?" (e.g., fever, headache) ?    None now  ?10. PREGNANCY: "Is there any chance you are pregnant?" "When was your last menstrual period?" ?      na ? ?Protocols used: Seizure-A-AH ? ?

## 2022-03-11 NOTE — Telephone Encounter (Signed)
Attempted to contact patient no answer LVM for patient to return call.  ?

## 2022-03-11 NOTE — Telephone Encounter (Signed)
Routing to provider to advise. See additional notes section of the message.  ?

## 2022-03-11 NOTE — Telephone Encounter (Signed)
I did not see the patient yesterday for an office visit. I have already resulted on the labs and his shoulder xray from office visit on 02/24/22.  I recommend he be seen in the ER since he had a seizure.   ? ?We have been unsuccessful in getting through to neurology office to make him appointment.  I recommend he call and make an appointment to be seen with Neurologist. ?

## 2022-03-14 NOTE — Telephone Encounter (Signed)
Attempted to contact patient, patient mother states he is working he will call us back later.  ?

## 2022-03-24 ENCOUNTER — Encounter: Payer: Self-pay | Admitting: Nurse Practitioner

## 2022-03-24 ENCOUNTER — Ambulatory Visit (INDEPENDENT_AMBULATORY_CARE_PROVIDER_SITE_OTHER): Payer: BC Managed Care – PPO | Admitting: Nurse Practitioner

## 2022-03-24 VITALS — BP 144/91 | HR 82 | Temp 98.1°F | Wt 208.0 lb

## 2022-03-24 DIAGNOSIS — R569 Unspecified convulsions: Secondary | ICD-10-CM | POA: Diagnosis not present

## 2022-03-24 DIAGNOSIS — M25512 Pain in left shoulder: Secondary | ICD-10-CM

## 2022-03-24 DIAGNOSIS — G8929 Other chronic pain: Secondary | ICD-10-CM | POA: Diagnosis not present

## 2022-03-24 NOTE — Patient Instructions (Signed)
Referral sent to [facility] via [facility]  Address: [address], [place], [place] [address] Phone: [phone]  

## 2022-03-24 NOTE — Assessment & Plan Note (Signed)
Chronic. Had a recent seizure. Has not followed up with neurology.  Discussed with patient that our office has been unsuccessful at making him an appointment.  He will need to call Dr. Lannie Fields office for an appointment.  He will likely need an updated EEG.

## 2022-03-24 NOTE — Progress Notes (Signed)
BP (!) 144/91   Pulse 82   Temp 98.1 F (36.7 C) (Oral)   Wt 208 lb (94.3 kg)   SpO2 96%   BMI 29.84 kg/m    Subjective:    Patient ID: Jose Dakins., male    DOB: 1971-09-25, 51 y.o.   MRN: [id number]  HPI: Jose [name]. is a 51 y.o. male  Chief Complaint  Patient presents with   Follow-up    Patient states he wants to discuss disability d/t COPD and his shoulder, other than that he is unsure as to why he is here.   He states "oh I did have a seizure". Pt states he has not heard from [facility] neuro.     Patient states he had a seizure recently.  States it wasn't a grand mal.  He felt it coming on and then the next thing he new he was sitting down and had been out for a couple of minutes.  Patient has not made an appointment with neurologist.    Patient would like to know the results of his shoulder xray.    Relevant past medical, surgical, family and social history reviewed and updated as indicated. Interim medical history since our last visit reviewed. Allergies and medications reviewed and updated.  Review of Systems  Musculoskeletal:        Left shoulder pain  Neurological:  Positive for seizures.   Per HPI unless specifically indicated above     Objective:    BP (!) 144/91   Pulse 82   Temp 98.1 F (36.7 C) (Oral)   Wt 208 lb (94.3 kg)   SpO2 96%   BMI 29.84 kg/m   Wt Readings from Last 3 Encounters:  03/24/22 208 lb (94.3 kg)  03/01/22 211 lb (95.7 kg)  02/24/22 207 lb 3.2 oz (94 kg)    Physical Exam Vitals and nursing note reviewed.  Constitutional:      General: He is not in acute distress.    Appearance: Normal appearance. He is not ill-appearing, toxic-appearing or diaphoretic.  HENT:     Head: Normocephalic.     Right Ear: External ear normal.     Left Ear: External ear normal.     Nose: Nose normal. No congestion or rhinorrhea.     Mouth/Throat:     Mouth: Mucous membranes are moist.  Eyes:     General:        Right eye: No discharge.         Left eye: No discharge.     Extraocular Movements: Extraocular movements intact.     Conjunctiva/sclera: Conjunctivae normal.     Pupils: Pupils are equal, round, and reactive to light.  Cardiovascular:     Rate and Rhythm: Normal rate and regular rhythm.     Heart sounds: No murmur heard. Pulmonary:     Effort: Pulmonary effort is normal. No respiratory distress.     Breath sounds: Normal breath sounds. No wheezing, rhonchi or rales.  Abdominal:     General: Abdomen is flat. Bowel sounds are normal.  Musculoskeletal:     Cervical back: Normal range of motion and neck supple.  Skin:    General: Skin is warm and dry.     Capillary Refill: Capillary refill takes less than 2 seconds.  Neurological:     General: No focal deficit present.     Mental Status: He is alert and oriented to person, place, and time.  Psychiatric:  Mood and Affect: Mood normal.        Behavior: Behavior normal.        Thought Content: Thought content normal.        Judgment: Judgment normal.    Results for orders placed or performed in visit on 03/10/22  Anemia Profile B  Result Value Ref Range   Total Iron Binding Capacity 320 250 - 450 ug/dL   UIBC 262 111 - 343 ug/dL   Iron 58 38 - 169 ug/dL   Iron Saturation 18 15 - 55 %   Ferritin 116 30 - 400 ng/mL   Vitamin B-12 332 232 - 1,245 pg/mL   Folate 5.2 >3.0 ng/mL   WBC 6.8 3.4 - 10.8 x10E3/uL   RBC 3.53 (L) 4.14 - 5.80 x10E6/uL   Hemoglobin 11.1 (L) 13.0 - 17.7 g/dL   Hematocrit 32.6 (L) 37.5 - 51.0 %   MCV 92 79 - 97 fL   MCH 31.4 26.6 - 33.0 pg   MCHC 34.0 31.5 - 35.7 g/dL   RDW 15.4 11.6 - 15.4 %   Platelets 146 (L) 150 - 450 x10E3/uL   Neutrophils 61 Not Estab. %   Lymphs 21 Not Estab. %   Monocytes 14 Not Estab. %   Eos 2 Not Estab. %   Basos 1 Not Estab. %   Neutrophils Absolute 4.2 1.4 - 7.0 x10E3/uL   Lymphocytes Absolute 1.4 0.7 - 3.1 x10E3/uL   Monocytes Absolute 0.9 0.1 - 0.9 x10E3/uL   EOS (ABSOLUTE) 0.1 0.0 - 0.4  x10E3/uL   Basophils Absolute 0.1 0.0 - 0.2 x10E3/uL   Immature Granulocytes 1 Not Estab. %   Immature Grans (Abs) 0.0 0.0 - 0.1 x10E3/uL   Retic Ct Pct 2.7 (H) 0.6 - 2.6 %      Assessment & Plan:   Problem List Items Addressed This Visit       Other   Seizures (HCC)    Chronic. Had a recent seizure. Has not followed up with neurology.  Discussed with patient that our office has been unsuccessful at making him an appointment.  He will need to call Dr. Lannie Fields office for an appointment.  He will likely need an updated EEG.         Other Visit Diagnoses     Chronic left shoulder pain    -  Primary   Repeat referral placed due to patient not answering when they tried to make him an appointment. Phone number given to patient during visit.   Relevant Orders   Ambulatory referral to Orthopedics        Follow up plan: Return if symptoms worsen or fail to improve.

## 2022-03-26 DIAGNOSIS — I5032 Chronic diastolic (congestive) heart failure: Secondary | ICD-10-CM | POA: Diagnosis not present

## 2022-03-26 DIAGNOSIS — I509 Heart failure, unspecified: Secondary | ICD-10-CM | POA: Diagnosis not present

## 2022-03-26 DIAGNOSIS — F10920 Alcohol use, unspecified with intoxication, uncomplicated: Secondary | ICD-10-CM | POA: Diagnosis not present

## 2022-03-26 DIAGNOSIS — D500 Iron deficiency anemia secondary to blood loss (chronic): Secondary | ICD-10-CM | POA: Diagnosis not present

## 2022-04-15 ENCOUNTER — Other Ambulatory Visit: Payer: Self-pay | Admitting: Nurse Practitioner

## 2022-04-15 MED ORDER — IRON (FERROUS SULFATE) 325 (65 FE) MG PO TABS: 325.0000 mg | ORAL_TABLET | Freq: Every day | ORAL | 1 refills | Status: DC

## 2022-04-15 MED ORDER — ALBUTEROL SULFATE HFA 108 (90 BASE) MCG/ACT IN AERS: 2.0000 | INHALATION_SPRAY | Freq: Four times a day (QID) | RESPIRATORY_TRACT | 1 refills | Status: DC | PRN

## 2022-04-15 NOTE — Telephone Encounter (Signed)
Medication Refill - Medication:  rosuvastatin (CRESTOR) 20 MG tablet  albuterol (VENTOLIN HFA) 108 (90 Base) MCG/ACT inhaler  amLODipine (NORVASC) 10 MG tablet  carbamazepine (TEGRETOL) 200 MG tablet  carvedilol (COREG) 25 MG tablet  empagliflozin (JARDIANCE) 10 MG TABS tablet  furosemide (LASIX) 40 MG tablet  hydrALAZINE (APRESOLINE) 50 MG tablet  Iron, Ferrous Sulfate, 325 (65 Fe) MG TABS  isosorbide mononitrate (IMDUR) 30 MG 24 hr tablet  lisinopril (ZESTRIL) 40 MG tablet   Has the patient contacted their pharmacy? No.   Preferred Pharmacy (with phone number or street name):  [place] (N), [address] - [address]  [place], [place] ([place]) [place] [address]  Phone:  [phone]  Fax:  [phone]   Has the patient been seen for an appointment in the last year OR does the patient have an upcoming appointment? Yes.    Agent: Please be advised that RX refills may take up to 3 business days. We ask that you follow-up with your pharmacy.

## 2022-04-15 NOTE — Telephone Encounter (Signed)
Patient called, left VM to return the call to the office. [place] called and spoke to Sherrill, Merchant navy officer about the refills requested. She says he will need all sent in except Rosuvastatin, it has 2 refills left. If patient returns the call, let him know refills available at the pharmacy for Rosuvastatin.

## 2022-04-15 NOTE — Telephone Encounter (Signed)
Requested Prescriptions  Pending Prescriptions Disp Refills  . albuterol (VENTOLIN HFA) 108 (90 Base) MCG/ACT inhaler 18 g 1    Sig: Inhale 2 puffs into the lungs every 6 (six) hours as needed for wheezing or shortness of breath.     Pulmonology:  Beta Agonists 2 Failed - 04/15/2022 10:08 AM      Failed - Last BP in normal range    BP Readings from Last 1 Encounters:  03/24/22 (!) 144/91         Passed - Last Heart Rate in normal range    Pulse Readings from Last 1 Encounters:  03/24/22 82         Passed - Valid encounter within last 12 months    Recent Outpatient Visits          3 weeks ago Chronic left shoulder pain   [facility] Jon Billings, NP   1 month ago Chronic diastolic congestive heart failure ([place])   [facility] Jon Billings, NP   3 months ago Primary hypertension   [place], Santiago Glad, NP   4 months ago Chronic diastolic congestive heart failure ([place])   [facility] Jon Billings, NP   8 months ago Primary hypertension   [facility] Jon Billings, NP      Future Appointments            In 1 month Jon Billings, NP [facility]           . amLODipine (NORVASC) 10 MG tablet 30 tablet 5    Sig: Take 1 tablet (10 mg total) by mouth daily.     Cardiovascular: Calcium Channel Blockers 2 Failed - 04/15/2022 10:08 AM      Failed - Last BP in normal range    BP Readings from Last 1 Encounters:  03/24/22 (!) 144/91         Passed - Last Heart Rate in normal range    Pulse Readings from Last 1 Encounters:  03/24/22 82         Passed - Valid encounter within last 6 months    Recent Outpatient Visits          3 weeks ago Chronic left shoulder pain   [facility] Jon Billings, NP   1 month ago Chronic diastolic congestive heart failure ([place])   [facility] Jon Billings, NP   3 months ago Primary hypertension    [place], Santiago Glad, NP   4 months ago Chronic diastolic congestive heart failure ([place])   [facility] Jon Billings, NP   8 months ago Primary hypertension   [facility] Jon Billings, NP      Future Appointments            In 1 month Jon Billings, NP [facility]           . carbamazepine (TEGRETOL) 200 MG tablet      Sig: Take 1 tablet (200 mg total) by mouth 2 (two) times daily.     Neurology:  Anticonvulsants - carbamazepine Failed - 04/15/2022 10:08 AM      Failed - Carbamazepine (serum) in normal range and within 360 days    Carbamazepine (Tegretol), S  Date Value Ref Range Status  10/18/2019 6.3 4.0 - 12.0 ug/mL Final    Comment:             In conjunction with other antiepileptic drugs  Therapeutic  4.0 -  8.0                                Toxicity     9.0 - 12.0                                    Carbamazepine alone                                Therapeutic  8.0 - 12.0                                 Detection Limit =  2.0                           <2.0 indicated None Detected          Failed - PLT in normal range and within 360 days    Platelets  Date Value Ref Range Status  03/10/2022 146 (L) 150 - 450 x10E3/uL Final         Failed - HGB in normal range and within 360 days    Hemoglobin  Date Value Ref Range Status  03/10/2022 11.1 (L) 13.0 - 17.7 g/dL Final         Failed - HCT in normal range and within 360 days    Hematocrit  Date Value Ref Range Status  03/10/2022 32.6 (L) 37.5 - 51.0 % Final         Failed - Cr in normal range and within 360 days    Creatinine, Ser  Date Value Ref Range Status  02/24/2022 0.67 (L) 0.76 - 1.27 mg/dL Final         Passed - AST in normal range and within 360 days    AST  Date Value Ref Range Status  01/03/2022 19 0 - 40 IU/L Final         Passed - ALT in normal range and within 360 days    ALT   Date Value Ref Range Status  01/03/2022 17 0 - 44 IU/L Final         Passed - WBC in normal range and within 360 days    WBC  Date Value Ref Range Status  03/10/2022 6.8 3.4 - 10.8 x10E3/uL Final  12/25/2021 10.3 4.0 - 10.5 K/uL Final         Passed - Na in normal range and within 360 days    Sodium  Date Value Ref Range Status  02/24/2022 140 134 - 144 mmol/L Final         Passed - Completed PHQ-2 or PHQ-9 in the last 360 days      Passed - Valid encounter within last 12 months    Recent Outpatient Visits          3 weeks ago Chronic left shoulder pain   [facility] Jon Billings, NP   1 month ago Chronic diastolic congestive heart failure [facility])   [facility] Jon Billings, NP   3 months ago Primary hypertension   [place], Santiago Glad, NP   4 months ago Chronic diastolic congestive heart failure ([place])   [place]  Jon Billings, NP   8 months ago Primary hypertension   [place], NP      Future Appointments            In 1 month Jon Billings, NP [facility]           . carvedilol (COREG) 25 MG tablet 60 tablet 5    Sig: Take 1 tablet (25 mg total) by mouth 2 (two) times daily with a meal.     Cardiovascular: Beta Blockers 3 Failed - 04/15/2022 10:08 AM      Failed - Cr in normal range and within 360 days    Creatinine, Ser  Date Value Ref Range Status  02/24/2022 0.67 (L) 0.76 - 1.27 mg/dL Final         Failed - Last BP in normal range    BP Readings from Last 1 Encounters:  03/24/22 (!) 144/91         Passed - AST in normal range and within 360 days    AST  Date Value Ref Range Status  01/03/2022 19 0 - 40 IU/L Final         Passed - ALT in normal range and within 360 days    ALT  Date Value Ref Range Status  01/03/2022 17 0 - 44 IU/L Final         Passed - Last Heart Rate in normal range    Pulse Readings from Last  1 Encounters:  03/24/22 82         Passed - Valid encounter within last 6 months    Recent Outpatient Visits          3 weeks ago Chronic left shoulder pain   [facility] Jon Billings, NP   1 month ago Chronic diastolic congestive heart failure ([place])   [facility] Jon Billings, NP   3 months ago Primary hypertension   [facility] Jon Billings, NP   4 months ago Chronic diastolic congestive heart failure ([place])   [facility] Jon Billings, NP   8 months ago Primary hypertension   [facility] Jon Billings, NP      Future Appointments            In 1 month Jon Billings, NP [facility]           . empagliflozin (JARDIANCE) 10 MG TABS tablet 30 tablet 5    Sig: Take 1 tablet (10 mg total) by mouth daily before breakfast.     Endocrinology:  Diabetes - SGLT2 Inhibitors Failed - 04/15/2022 10:08 AM      Failed - Cr in normal range and within 360 days    Creatinine, Ser  Date Value Ref Range Status  02/24/2022 0.67 (L) 0.76 - 1.27 mg/dL Final         Passed - HBA1C is between 0 and 7.9 and within 180 days    Hgb A1c MFr Bld  Date Value Ref Range Status  02/24/2022 5.7 (H) 4.8 - 5.6 % Final    Comment:             Prediabetes: 5.7 - 6.4          Diabetes: >6.4          Glycemic control for adults with diabetes: <7.0          Passed - eGFR in normal range and within 360 days    GFR  calc Af Amer  Date Value Ref Range Status  05/16/2020 >60 >60 mL/min Final   GFR, Estimated  Date Value Ref Range Status  12/25/2021 >60 >60 mL/min Final    Comment:    (NOTE) Calculated using the CKD-EPI Creatinine Equation (2021)    eGFR  Date Value Ref Range Status  02/24/2022 112 >59 mL/min/1.73 Final         Passed - Valid encounter within last 6 months    Recent Outpatient Visits          3 weeks ago Chronic left shoulder pain   [facility] Jon Billings, NP   1 month ago Chronic diastolic congestive heart failure ([place])   [facility] Jon Billings, NP   3 months ago Primary hypertension   [place], Santiago Glad, NP   4 months ago Chronic diastolic congestive heart failure ([place])   [facility] Jon Billings, NP   8 months ago Primary hypertension   [facility] Jon Billings, NP      Future Appointments            In 1 month Jon Billings, NP [facility]           . furosemide (LASIX) 40 MG tablet 30 tablet 2    Sig: Take 1 tablet (40 mg total) by mouth daily. Increased from 20 mg daily.     Cardiovascular:  Diuretics - Loop Failed - 04/15/2022 10:08 AM      Failed - Cr in normal range and within 180 days    Creatinine, Ser  Date Value Ref Range Status  02/24/2022 0.67 (L) 0.76 - 1.27 mg/dL Final         Failed - Mg Level in normal range and within 180 days    Magnesium  Date Value Ref Range Status  01/03/2022 1.0 (L) 1.6 - 2.3 mg/dL Final         Failed - Last BP in normal range    BP Readings from Last 1 Encounters:  03/24/22 (!) 144/91         Passed - K in normal range and within 180 days    Potassium  Date Value Ref Range Status  02/24/2022 4.0 3.5 - 5.2 mmol/L Final         Passed - Ca in normal range and within 180 days    Calcium  Date Value Ref Range Status  02/24/2022 9.2 8.7 - 10.2 mg/dL Final         Passed - Na in normal range and within 180 days    Sodium  Date Value Ref Range Status  02/24/2022 140 134 - 144 mmol/L Final         Passed - Cl in normal range and within 180 days    Chloride  Date Value Ref Range Status  02/24/2022 101 96 - 106 mmol/L Final         Passed - Valid encounter within last 6 months    Recent Outpatient Visits          3 weeks ago Chronic left shoulder pain   [facility] Jon Billings, NP   1 month ago Chronic diastolic congestive heart failure [facility])    [facility] Jon Billings, NP   3 months ago Primary hypertension   [facility] Jon Billings, NP   4 months ago Chronic diastolic congestive heart failure [facility])   [place], Santiago Glad,  NP   8 months ago Primary hypertension   [place], NP      Future Appointments            In 1 month Jon Billings, NP [facility]           . hydrALAZINE (APRESOLINE) 50 MG tablet 90 tablet 5    Sig: Take 1 tablet (50 mg total) by mouth 3 (three) times daily.     Cardiovascular:  Vasodilators Failed - 04/15/2022 10:08 AM      Failed - HCT in normal range and within 360 days    Hematocrit  Date Value Ref Range Status  03/10/2022 32.6 (L) 37.5 - 51.0 % Final         Failed - HGB in normal range and within 360 days    Hemoglobin  Date Value Ref Range Status  03/10/2022 11.1 (L) 13.0 - 17.7 g/dL Final         Failed - RBC in normal range and within 360 days    RBC  Date Value Ref Range Status  03/10/2022 3.53 (L) 4.14 - 5.80 x10E6/uL Final  12/25/2021 4.08 (L) 4.22 - 5.81 MIL/uL Final         Failed - PLT in normal range and within 360 days    Platelets  Date Value Ref Range Status  03/10/2022 146 (L) 150 - 450 x10E3/uL Final         Failed - ANA Screen, Ifa, Serum in normal range and within 360 days    No results found for: "ANA", "ANATITER", "LABANTI"       Failed - Last BP in normal range    BP Readings from Last 1 Encounters:  03/24/22 (!) 144/91         Passed - WBC in normal range and within 360 days    WBC  Date Value Ref Range Status  03/10/2022 6.8 3.4 - 10.8 x10E3/uL Final  12/25/2021 10.3 4.0 - 10.5 K/uL Final         Passed - Valid encounter within last 12 months    Recent Outpatient Visits          3 weeks ago Chronic left shoulder pain   [facility] Jon Billings, NP   1 month ago Chronic diastolic congestive heart failure ([place])    [facility] Jon Bill[facility], NP   3 months ago Primary hypertension   [place], Santiago Glad, NP   4 months ago Chronic diastolic congestive heart failure [facility])   [facility] Jon Billings, NP   8 months ago Primary hypertension   [facility] Jon Billings, NP      Future Appointments            In 1 month Jon Billings, NP [facility]           . Iron, Ferrous Sulfate, 325 (65 Fe) MG TABS 30 tablet 1    Sig: Take 325 mg by mouth daily.     Endocrinology:  Minerals - Iron Supplementation Failed - 04/15/2022 10:08 AM      Failed - HGB in normal range and within 360 days    Hemoglobin  Date Value Ref Range Status  03/10/2022 11.1 (L) 13.0 - 17.7 g/dL Final         Failed - HCT in normal range and within 360 days    Hematocrit  Date Value Ref Range Status  03/10/2022  32.6 (L) 37.5 - 51.0 % Final         Failed - RBC in normal range and within 360 days    RBC  Date Value Ref Range Status  03/10/2022 3.53 (L) 4.14 - 5.80 x10E6/uL Final  12/25/2021 4.08 (L) 4.22 - 5.81 MIL/uL Final         Passed - Fe (serum) in normal range and within 360 days    Iron  Date Value Ref Range Status  03/10/2022 58 38 - 169 ug/dL Final   Iron Saturation  Date Value Ref Range Status  03/10/2022 18 15 - 55 % Final         Passed - Ferritin in normal range and within 360 days    Ferritin  Date Value Ref Range Status  03/10/2022 116 30 - 400 ng/mL Final         Passed - Valid encounter within last 12 months    Recent Outpatient Visits          3 weeks ago Chronic left shoulder pain   [facility] Jon Bill[facility], NP   1 month ago Chronic diastolic congestive heart failure ([place])   [facility] Jon [facility], NP   3 months ago Primary hypertension   [place], Santiago Glad, NP   4 months ago Chronic diastolic congestive heart failure [facility])    [facility] Jon Billings, NP   8 months ago Primary hypertension   [facility] Jon Billings, NP      Future Appointments            In 1 month Jon Billings, NP [facility]           . lisinopril (ZESTRIL) 40 MG tablet 30 tablet 5    Sig: Take 1 tablet (40 mg total) by mouth daily.     Cardiovascular:  ACE Inhibitors Failed - 04/15/2022 10:08 AM      Failed - Cr in normal range and within 180 days    Creatinine, Ser  Date Value Ref Range Status  02/24/2022 0.67 (L) 0.76 - 1.27 mg/dL Final         Failed - Last BP in normal range    BP Readings from Last 1 Encounters:  03/24/22 (!) 144/91         Passed - K in normal range and within 180 days    Potassium  Date Value Ref Range Status  02/24/2022 4.0 3.5 - 5.2 mmol/L Final         Passed - Patient is not pregnant      Passed - Valid encounter within last 6 months    Recent Outpatient Visits          3 weeks ago Chronic left shoulder pain   [facility] Jon [facility], NP   1 month ago Chronic diastolic congestive heart failure ([place])   [facility] Jon Billings, NP   3 months ago Primary hypertension   [place], Santiago Glad, NP   4 months ago Chronic diastolic congestive heart failure [facility])   [facility] Jon Billings, NP   8 months ago Primary hypertension   [facility] Jon Billings, NP      Future Appointments            In 1 month Jon Billings, NP [facility] [place]

## 2022-04-15 NOTE — Telephone Encounter (Signed)
Requested medication (s) are due for refill today- all not due today  Requested medication (s) are on the active medication list -yes  Future visit scheduled -yes  Last refill: Amlodipine, Carvedilol, Hydralazine,lisinopril- 11/30/21- outside provider Furosamide-12/25/21- outside provider Empagliflozin- 02/01/22- outside provider Carbamazepine- 02/16/22 -fails lab protocol, historical   Notes to clinic: Medications requested have been previously prescribed outside of office- sent for provider review   Requested Prescriptions  Pending Prescriptions Disp Refills   amLODipine (NORVASC) 10 MG tablet 30 tablet 5    Sig: Take 1 tablet (10 mg total) by mouth daily.     Cardiovascular: Calcium Channel Blockers 2 Failed - 04/15/2022 10:08 AM      Failed - Last BP in normal range    BP Readings from Last 1 Encounters:  03/24/22 (!) 144/91         Passed - Last Heart Rate in normal range    Pulse Readings from Last 1 Encounters:  03/24/22 82         Passed - Valid encounter within last 6 months    Recent Outpatient Visits           3 weeks ago Chronic left shoulder pain   [facility] Jon Billings, NP   1 month ago Chronic diastolic congestive heart failure ([place])   [facility] Jon Billings, NP   3 months ago Primary hypertension   [place], Santiago Glad, NP   4 months ago Chronic diastolic congestive heart failure ([place])   [facility] Jon Billings, NP   8 months ago Primary hypertension   [facility] Jon Billings, NP       Future Appointments             In 1 month Jon Billings, NP [place]             carbamazepine (TEGRETOL) 200 MG tablet      Sig: Take 1 tablet (200 mg total) by mouth 2 (two) times daily.     Neurology:  Anticonvulsants - carbamazepine Failed - 04/15/2022 10:08 AM      Failed - Carbamazepine (serum) in normal range and within 360 days     Carbamazepine (Tegretol), S  Date Value Ref Range Status  10/18/2019 6.3 4.0 - 12.0 ug/mL Final    Comment:             In conjunction with other antiepileptic drugs                                Therapeutic  4.0 -  8.0                                Toxicity     9.0 - 12.0                                    Carbamazepine alone                                Therapeutic  8.0 - 12.0  Detection Limit =  2.0                           <2.0 indicated None Detected          Failed - PLT in normal range and within 360 days    Platelets  Date Value Ref Range Status  03/10/2022 146 (L) 150 - 450 x10E3/uL Final         Failed - HGB in normal range and within 360 days    Hemoglobin  Date Value Ref Range Status  03/10/2022 11.1 (L) 13.0 - 17.7 g/dL Final         Failed - HCT in normal range and within 360 days    Hematocrit  Date Value Ref Range Status  03/10/2022 32.6 (L) 37.5 - 51.0 % Final         Failed - Cr in normal range and within 360 days    Creatinine, Ser  Date Value Ref Range Status  02/24/2022 0.67 (L) 0.76 - 1.27 mg/dL Final         Passed - AST in normal range and within 360 days    AST  Date Value Ref Range Status  01/03/2022 19 0 - 40 IU/L Final         Passed - ALT in normal range and within 360 days    ALT  Date Value Ref Range Status  01/03/2022 17 0 - 44 IU/L Final         Passed - WBC in normal range and within 360 days    WBC  Date Value Ref Range Status  03/10/2022 6.8 3.4 - 10.8 x10E3/uL Final  12/25/2021 10.3 4.0 - 10.5 K/uL Final         Passed - Na in normal range and within 360 days    Sodium  Date Value Ref Range Status  02/24/2022 140 134 - 144 mmol/L Final         Passed - Completed PHQ-2 or PHQ-9 in the last 360 days      Passed - Valid encounter within last 12 months    Recent Outpatient Visits           3 weeks ago Chronic left shoulder pain   [facility] Jon Billings, NP    1 month ago Chronic diastolic congestive heart failure ([place])   [facility] Jon Billings, NP   3 months ago Primary hypertension   [facility] Jon Billings, NP   4 months ago Chronic diastolic congestive heart failure ([place])   [facility] Jon Billings, NP   8 months ago Primary hypertension   [facility] Jon Billings, NP       Future Appointments             In 1 month Jon Billings, NP [facility]             carvedilol (COREG) 25 MG tablet 60 tablet 5    Sig: Take 1 tablet (25 mg total) by mouth 2 (two) times daily with a meal.     Cardiovascular: Beta Blockers 3 Failed - 04/15/2022 10:08 AM      Failed - Cr in normal range and within 360 days    Creatinine, Ser  Date Value Ref Range Status  02/24/2022 0.67 (L) 0.76 - 1.27 mg/dL Final         Failed - Last BP in normal  range    BP Readings from Last 1 Encounters:  03/24/22 (!) 144/91         Passed - AST in normal range and within 360 days    AST  Date Value Ref Range Status  01/03/2022 19 0 - 40 IU/L Final         Passed - ALT in normal range and within 360 days    ALT  Date Value Ref Range Status  01/03/2022 17 0 - 44 IU/L Final         Passed - Last Heart Rate in normal range    Pulse Readings from Last 1 Encounters:  03/24/22 82         Passed - Valid encounter within last 6 months    Recent Outpatient Visits           3 weeks ago Chronic left shoulder pain   [facility] Jon Billings, NP   1 month ago Chronic diastolic congestive heart failure ([place])   [facility] Jon Billings, NP   3 months ago Primary hypertension   [facility] Jon Billings, NP   4 months ago Chronic diastolic congestive heart failure ([place])   [facility] Jon Billings, NP   8 months ago Primary hypertension   [facility] Jon Billings, NP        Future Appointments             In 1 month Jon Billings, NP [place], [facility]             empagliflozin (JARDIANCE) 10 MG TABS tablet 30 tablet 5    Sig: Take 1 tablet (10 mg total) by mouth daily before breakfast.     Endocrinology:  Diabetes - SGLT2 Inhibitors Failed - 04/15/2022 10:08 AM      Failed - Cr in normal range and within 360 days    Creatinine, Ser  Date Value Ref Range Status  02/24/2022 0.67 (L) 0.76 - 1.27 mg/dL Final         Passed - HBA1C is between 0 and 7.9 and within 180 days    Hgb A1c MFr Bld  Date Value Ref Range Status  02/24/2022 5.7 (H) 4.8 - 5.6 % Final    Comment:             Prediabetes: 5.7 - 6.4          Diabetes: >6.4          Glycemic control for adults with diabetes: <7.0          Passed - eGFR in normal range and within 360 days    GFR calc Af Amer  Date Value Ref Range Status  05/16/2020 >60 >60 mL/min Final   GFR, Estimated  Date Value Ref Range Status  12/25/2021 >60 >60 mL/min Final    Comment:    (NOTE) Calculated using the CKD-EPI Creatinine Equation (2021)    eGFR  Date Value Ref Range Status  02/24/2022 112 >59 mL/min/1.73 Final         Passed - Valid encounter within last 6 months    Recent Outpatient Visits           3 weeks ago Chronic left shoulder pain   [facility] Jon Billings, NP   1 month ago Chronic diastolic congestive heart failure ([place])   [facility] Jon Billings, NP   3 months ago Primary hypertension   [facility] Santiago Glad,  NP   4 months ago Chronic diastolic congestive heart failure ([place])   [place], NP   8 months ago Primary hypertension   [facility] Jon Billings, NP       Future Appointments             In 1 month Jon Billings, NP [facility]             furosemide (LASIX) 40 MG tablet 30 tablet 2    Sig: Take 1 tablet (40 mg  total) by mouth daily. Increased from 20 mg daily.     Cardiovascular:  Diuretics - Loop Failed - 04/15/2022 10:08 AM      Failed - Cr in normal range and within 180 days    Creatinine, Ser  Date Value Ref Range Status  02/24/2022 0.67 (L) 0.76 - 1.27 mg/dL Final         Failed - Mg Level in normal range and within 180 days    Magnesium  Date Value Ref Range Status  01/03/2022 1.0 (L) 1.6 - 2.3 mg/dL Final         Failed - Last BP in normal range    BP Readings from Last 1 Encounters:  03/24/22 (!) 144/91         Passed - K in normal range and within 180 days    Potassium  Date Value Ref Range Status  02/24/2022 4.0 3.5 - 5.2 mmol/L Final         Passed - Ca in normal range and within 180 days    Calcium  Date Value Ref Range Status  02/24/2022 9.2 8.7 - 10.2 mg/dL Final         Passed - Na in normal range and within 180 days    Sodium  Date Value Ref Range Status  02/24/2022 140 134 - 144 mmol/L Final         Passed - Cl in normal range and within 180 days    Chloride  Date Value Ref Range Status  02/24/2022 101 96 - 106 mmol/L Final         Passed - Valid encounter within last 6 months    Recent Outpatient Visits           3 weeks ago Chronic left shoulder pain   [facility] Jon Billings, NP   1 month ago Chronic diastolic congestive heart failure ([place])   [facility] Jon Billings, NP   3 months ago Primary hypertension   [place], Santiago Glad, NP   4 months ago Chronic diastolic congestive heart failure ([place])   [facility] Jon Billings, NP   8 months ago Primary hypertension   [facility] Jon Billings, NP       Future Appointments             In 1 month Jon Billings, NP [facility]             hydrALAZINE (APRESOLINE) 50 MG tablet 90 tablet 5    Sig: Take 1 tablet (50 mg total) by mouth 3 (three) times daily.     Cardiovascular:   Vasodilators Failed - 04/15/2022 10:08 AM      Failed - HCT in normal range and within 360 days    Hematocrit  Date Value Ref Range Status  03/10/2022 32.6 (L) 37.5 - 51.0 % Final         Failed -  HGB in normal range and within 360 days    Hemoglobin  Date Value Ref Range Status  03/10/2022 11.1 (L) 13.0 - 17.7 g/dL Final         Failed - RBC in normal range and within 360 days    RBC  Date Value Ref Range Status  03/10/2022 3.53 (L) 4.14 - 5.80 x10E6/uL Final  12/25/2021 4.08 (L) 4.22 - 5.81 MIL/uL Final         Failed - PLT in normal range and within 360 days    Platelets  Date Value Ref Range Status  03/10/2022 146 (L) 150 - 450 x10E3/uL Final         Failed - ANA Screen, Ifa, Serum in normal range and within 360 days    No results found for: "ANA", "ANATITER", "LABANTI"       Failed - Last BP in normal range    BP Readings from Last 1 Encounters:  03/24/22 (!) 144/91         Passed - WBC in normal range and within 360 days    WBC  Date Value Ref Range Status  03/10/2022 6.8 3.4 - 10.8 x10E3/uL Final  12/25/2021 10.3 4.0 - 10.5 K/uL Final         Passed - Valid encounter within last 12 months    Recent Outpatient Visits           3 weeks ago Chronic left shoulder pain   [facility] Bill[facility], NP   1 month ago Chronic diastolic congestive heart failure ([place])   [facility] Jon Billings, NP   3 months ago Primary hypertension   [place], Santiago Glad, NP   4 months ago Chronic diastolic congestive heart failure ([place])   [facility] Jon Billings, NP   8 months ago Primary hypertension   [facility] Jon Billings, NP       Future Appointments             In 1 month Jon Billings, NP [facility]             lisinopril (ZESTRIL) 40 MG tablet 30 tablet 5    Sig: Take 1 tablet (40 mg total) by mouth daily.     Cardiovascular:  ACE  Inhibitors Failed - 04/15/2022 10:08 AM      Failed - Cr in normal range and within 180 days    Creatinine, Ser  Date Value Ref Range Status  02/24/2022 0.67 (L) 0.76 - 1.27 mg/dL Final         Failed - Last BP in normal range    BP Readings from Last 1 Encounters:  03/24/22 (!) 144/91         Passed - K in normal range and within 180 days    Potassium  Date Value Ref Range Status  02/24/2022 4.0 3.5 - 5.2 mmol/L Final         Passed - Patient is not pregnant      Passed - Valid encounter within last 6 months    Recent Outpatient Visits           3 weeks ago Chronic left shoulder pain   [place], NP   1 month ago Chronic diastolic congestive heart failure [facility])   [facility] Jon Billings, NP   3 months ago Primary hypertension   [place], NP   4 months ago Chronic  diastolic congestive heart failure [facility])   [place], NP   8 months ago Primary hypertension   [facility] Jon Billings, NP       Future Appointments             In 1 month Jon Billings, NP [employer], [facility]            Signed Prescriptions Disp Refills   albuterol (VENTOLIN HFA) 108 (90 Base) MCG/ACT inhaler 18 g 1    Sig: Inhale 2 puffs into the lungs every 6 (six) hours as needed for wheezing or shortness of breath.     Pulmonology:  Beta Agonists 2 Failed - 04/15/2022 10:08 AM      Failed - Last BP in normal range    BP Readings from Last 1 Encounters:  03/24/22 (!) 144/91         Passed - Last Heart Rate in normal range    Pulse Readings from Last 1 Encounters:  03/24/22 82         Passed - Valid encounter within last 12 months    Recent Outpatient Visits           3 weeks ago Chronic left shoulder pain   [facility] Jon Billings, NP   1 month ago Chronic diastolic congestive heart failure ([place])   [facility] Jon Billings, NP   3 months ago Primary hypertension   [place], Santiago Glad, NP   4 months ago Chronic diastolic congestive heart failure ([place])   [facility] Jon Billings, NP   8 months ago Primary hypertension   [place], NP       Future Appointments             In 1 month Jon Billings, NP [facility], [facility]             Iron, Ferrous Sulfate, 325 (65 Fe) MG TABS 30 tablet 1    Sig: Take 325 mg by mouth daily.     Endocrinology:  Minerals - Iron Supplementation Failed - 04/15/2022 10:08 AM      Failed - HGB in normal range and within 360 days    Hemoglobin  Date Value Ref Range Status  03/10/2022 11.1 (L) 13.0 - 17.7 g/dL Final         Failed - HCT in normal range and within 360 days    Hematocrit  Date Value Ref Range Status  03/10/2022 32.6 (L) 37.5 - 51.0 % Final         Failed - RBC in normal range and within 360 days    RBC  Date Value Ref Range Status  03/10/2022 3.53 (L) 4.14 - 5.80 x10E6/uL Final  12/25/2021 4.08 (L) 4.22 - 5.81 MIL/uL Final         Passed - Fe (serum) in normal range and within 360 days    Iron  Date Value Ref Range Status  03/10/2022 58 38 - 169 ug/dL Final   Iron Saturation  Date Value Ref Range Status  03/10/2022 18 15 - 55 % Final         Passed - Ferritin in normal range and within 360 days    Ferritin  Date Value Ref Range Status  03/10/2022 116 30 - 400 ng/mL Final         Passed - Valid encounter within last 12 months    Recent Outpatient Visits  3 weeks ago Chronic left shoulder pain   [facility] Jon Bill[facility], NP   1 month ago Chronic diastolic congestive heart failure [facility])   [facility] Jon Billings, NP   3 months ago Primary hypertension   [facility] Jon Bill[facility], NP   4 months ago Chronic diastolic congestive heart failure [facility])   [facility] Jon Bill[facility], NP   8 months ago Primary hypertension   [facility] Jon Billings, NP       Future Appointments             In 1 month Jon Billings, NP [facility]               Requested Prescriptions  Pending Prescriptions Disp Refills   amLODipine (NORVASC) 10 MG tablet 30 tablet 5    Sig: Take 1 tablet (10 mg total) by mouth daily.     Cardiovascular: Calcium Channel Blockers 2 Failed - 04/15/2022 10:08 AM      Failed - Last BP in normal range    BP Readings from Last 1 Encounters:  03/24/22 (!) 144/91         Passed - Last Heart Rate in normal range    Pulse Readings from Last 1 Encounters:  03/24/22 82         Passed - Valid encounter within last 6 months    Recent Outpatient Visits           3 weeks ago Chronic left shoulder pain   [facility] Jon Billings, NP   1 month ago Chronic diastolic congestive heart failure ([place])   [facility] Jon Billings, NP   3 months ago Primary hypertension   [place], Santiago Glad, NP   4 months ago Chronic diastolic congestive heart failure ([place])   [facility] Jon Bill[facility], NP   8 months ago Primary hypertension   [facility] Jon Billings, NP       Future Appointments             In 1 month Jon Billings, NP [place]             carbamazepine (TEGRETOL) 200 MG tablet      Sig: Take 1 tablet (200 mg total) by mouth 2 (two) times daily.     Neurology:  Anticonvulsants - carbamazepine Failed - 04/15/2022 10:08 AM      Failed - Carbamazepine (serum) in normal range and within 360 days    Carbamazepine (Tegretol), S  Date Value Ref Range Status  10/18/2019 6.3 4.0 - 12.0 ug/mL Final    Comment:             In conjunction with other antiepileptic drugs                                Therapeutic  4.0 -  8.0                                 Toxicity     9.0 - 12.0                                    Carbamazepine alone  Therapeutic  8.0 - 12.0                                 Detection Limit =  2.0                           <2.0 indicated None Detected          Failed - PLT in normal range and within 360 days    Platelets  Date Value Ref Range Status  03/10/2022 146 (L) 150 - 450 x10E3/uL Final         Failed - HGB in normal range and within 360 days    Hemoglobin  Date Value Ref Range Status  03/10/2022 11.1 (L) 13.0 - 17.7 g/dL Final         Failed - HCT in normal range and within 360 days    Hematocrit  Date Value Ref Range Status  03/10/2022 32.6 (L) 37.5 - 51.0 % Final         Failed - Cr in normal range and within 360 days    Creatinine, Ser  Date Value Ref Range Status  02/24/2022 0.67 (L) 0.76 - 1.27 mg/dL Final         Passed - AST in normal range and within 360 days    AST  Date Value Ref Range Status  01/03/2022 19 0 - 40 IU/L Final         Passed - ALT in normal range and within 360 days    ALT  Date Value Ref Range Status  01/03/2022 17 0 - 44 IU/L Final         Passed - WBC in normal range and within 360 days    WBC  Date Value Ref Range Status  03/10/2022 6.8 3.4 - 10.8 x10E3/uL Final  12/25/2021 10.3 4.0 - 10.5 K/uL Final         Passed - Na in normal range and within 360 days    Sodium  Date Value Ref Range Status  02/24/2022 140 134 - 144 mmol/L Final         Passed - Completed PHQ-2 or PHQ-9 in the last 360 days      Passed - Valid encounter within last 12 months    Recent Outpatient Visits           3 weeks ago Chronic left shoulder pain   [facility] Jon Billings, NP   1 month ago Chronic diastolic congestive heart failure ([place])   [facility] Jon Billings, NP   3 months ago Primary hypertension   [place], Santiago Glad, NP   4 months ago Chronic diastolic congestive heart failure  ([place])   [facility] Jon Billings, NP   8 months ago Primary hypertension   [facility] Jon Billings, NP       Future Appointments             In 1 month Jon Billings, NP [facility]             carvedilol (COREG) 25 MG tablet 60 tablet 5    Sig: Take 1 tablet (25 mg total) by mouth 2 (two) times daily with a meal.     Cardiovascular: Beta Blockers 3 Failed - 04/15/2022 10:08 AM      Failed - Cr in normal range and  within 360 days    Creatinine, Ser  Date Value Ref Range Status  02/24/2022 0.67 (L) 0.76 - 1.27 mg/dL Final         Failed - Last BP in normal range    BP Readings from Last 1 Encounters:  03/24/22 (!) 144/91         Passed - AST in normal range and within 360 days    AST  Date Value Ref Range Status  01/03/2022 19 0 - 40 IU/L Final         Passed - ALT in normal range and within 360 days    ALT  Date Value Ref Range Status  01/03/2022 17 0 - 44 IU/L Final         Passed - Last Heart Rate in normal range    Pulse Readings from Last 1 Encounters:  03/24/22 82         Passed - Valid encounter within last 6 months    Recent Outpatient Visits           3 weeks ago Chronic left shoulder pain   [facility] Jon Billings, NP   1 month ago Chronic diastolic congestive heart failure ([place])   [facility] Jon Billings, NP   3 months ago Primary hypertension   [facility] Jon Billings, NP   4 months ago Chronic diastolic congestive heart failure ([place])   [facility] Jon Bill[facility], NP   8 months ago Primary hypertension   [facility] Jon Billings, NP       Future Appointments             In 1 month Jon Billings, NP [facility]             empagliflozin (JARDIANCE) 10 MG TABS tablet 30 tablet 5    Sig: Take 1 tablet (10 mg total) by mouth daily before breakfast.      Endocrinology:  Diabetes - SGLT2 Inhibitors Failed - 04/15/2022 10:08 AM      Failed - Cr in normal range and within 360 days    Creatinine, Ser  Date Value Ref Range Status  02/24/2022 0.67 (L) 0.76 - 1.27 mg/dL Final         Passed - HBA1C is between 0 and 7.9 and within 180 days    Hgb A1c MFr Bld  Date Value Ref Range Status  02/24/2022 5.7 (H) 4.8 - 5.6 % Final    Comment:             Prediabetes: 5.7 - 6.4          Diabetes: >6.4          Glycemic control for adults with diabetes: <7.0          Passed - eGFR in normal range and within 360 days    GFR calc Af Amer  Date Value Ref Range Status  05/16/2020 >60 >60 mL/min Final   GFR, Estimated  Date Value Ref Range Status  12/25/2021 >60 >60 mL/min Final    Comment:    (NOTE) Calculated using the CKD-EPI Creatinine Equation (2021)    eGFR  Date Value Ref Range Status  02/24/2022 112 >59 mL/min/1.73 Final         Passed - Valid encounter within last 6 months    Recent Outpatient Visits           3 weeks ago Chronic left shoulder pain   [facility] Jon Billings, NP   1 month ago Chronic diastolic congestive heart failure [facility])   [facility] Jon Bill[facility], NP   3 months ago Primary hypertension   [facility] Jon Billings, NP   4 months ago Chronic diastolic congestive heart failure [facility])   [facility] Jon Bill[facility], NP   8 months ago Primary hypertension   [facility] Jon Billings, NP       Future Appointments             In 1 month Jon Billings, NP [facility]             furosemide (LASIX) 40 MG tablet 30 tablet 2    Sig: Take 1 tablet (40 mg total) by mouth daily. Increased from 20 mg daily.     Cardiovascular:  Diuretics - Loop Failed - 04/15/2022 10:08 AM      Failed - Cr in normal range and within 180 days    Creatinine, Ser  Date Value Ref Range Status  02/24/2022 0.67 (L) 0.76 -  1.27 mg/dL Final         Failed - Mg Level in normal range and within 180 days    Magnesium  Date Value Ref Range Status  01/03/2022 1.0 (L) 1.6 - 2.3 mg/dL Final         Failed - Last BP in normal range    BP Readings from Last 1 Encounters:  03/24/22 (!) 144/91         Passed - K in normal range and within 180 days    Potassium  Date Value Ref Range Status  02/24/2022 4.0 3.5 - 5.2 mmol/L Final         Passed - Ca in normal range and within 180 days    Calcium  Date Value Ref Range Status  02/24/2022 9.2 8.7 - 10.2 mg/dL Final         Passed - Na in normal range and within 180 days    Sodium  Date Value Ref Range Status  02/24/2022 140 134 - 144 mmol/L Final         Passed - Cl in normal range and within 180 days    Chloride  Date Value Ref Range Status  02/24/2022 101 96 - 106 mmol/L Final         Passed - Valid encounter within last 6 months    Recent Outpatient Visits           3 weeks ago Chronic left shoulder pain   [facility] Jon Billings, NP   1 month ago Chronic diastolic congestive heart failure ([place])   [facility] Jon Billings, NP   3 months ago Primary hypertension   [place], Santiago Glad, NP   4 months ago Chronic diastolic congestive heart failure ([place])   [facility] Jon Billings, NP   8 months ago Primary hypertension   [facility] Jon Billings, NP       Future Appointments             In 1 month Jon Billings, NP [facility]             hydrALAZINE (APRESOLINE) 50 MG tablet 90 tablet 5    Sig: Take 1 tablet (50 mg total) by mouth 3 (three) times daily.     Cardiovascular:  Vasodilators Failed - 04/15/2022 10:08 AM      Failed -  HCT in normal range and within 360 days    Hematocrit  Date Value Ref Range Status  03/10/2022 32.6 (L) 37.5 - 51.0 % Final         Failed - HGB in normal range and within 360 days     Hemoglobin  Date Value Ref Range Status  03/10/2022 11.1 (L) 13.0 - 17.7 g/dL Final         Failed - RBC in normal range and within 360 days    RBC  Date Value Ref Range Status  03/10/2022 3.53 (L) 4.14 - 5.80 x10E6/uL Final  12/25/2021 4.08 (L) 4.22 - 5.81 MIL/uL Final         Failed - PLT in normal range and within 360 days    Platelets  Date Value Ref Range Status  03/10/2022 146 (L) 150 - 450 x10E3/uL Final         Failed - ANA Screen, Ifa, Serum in normal range and within 360 days    No results found for: "ANA", "ANATITER", "LABANTI"       Failed - Last BP in normal range    BP Readings from Last 1 Encounters:  03/24/22 (!) 144/91         Passed - WBC in normal range and within 360 days    WBC  Date Value Ref Range Status  03/10/2022 6.8 3.4 - 10.8 x10E3/uL Final  12/25/2021 10.3 4.0 - 10.5 K/uL Final         Passed - Valid encounter within last 12 months    Recent Outpatient Visits           3 weeks ago Chronic left shoulder pain   [facility] Jon Billings, NP   1 month ago Chronic diastolic congestive heart failure ([place])   [facility] Jon Billings, NP   3 months ago Primary hypertension   [place], Santiago Glad, NP   4 months ago Chronic diastolic congestive heart failure ([place])   [facility] Jon Billings, NP   8 months ago Primary hypertension   [facility] Jon Billings, NP       Future Appointments             In 1 month Jon Billings, NP [facility]             lisinopril (ZESTRIL) 40 MG tablet 30 tablet 5    Sig: Take 1 tablet (40 mg total) by mouth daily.     Cardiovascular:  ACE Inhibitors Failed - 04/15/2022 10:08 AM      Failed - Cr in normal range and within 180 days    Creatinine, Ser  Date Value Ref Range Status  02/24/2022 0.67 (L) 0.76 - 1.27 mg/dL Final         Failed - Last BP in normal range    BP Readings from  Last 1 Encounters:  03/24/22 (!) 144/91         Passed - K in normal range and within 180 days    Potassium  Date Value Ref Range Status  02/24/2022 4.0 3.5 - 5.2 mmol/L Final         Passed - Patient is not pregnant      Passed - Valid encounter within last 6 months    Recent Outpatient Visits           3 weeks ago Chronic left shoulder pain   [facility] Jon Billings, NP  1 month ago Chronic diastolic congestive heart failure ([place])   [place], NP   3 months ago Primary hypertension   [facility] Jon Billings, NP   4 months ago Chronic diastolic congestive heart failure [facility])   [facility] Jon Billings, NP   8 months ago Primary hypertension   [facility] Jon Billings, NP       Future Appointments             In 1 month Jon Billings, NP [facility]            Signed Prescriptions Disp Refills   albuterol (VENTOLIN HFA) 108 (90 Base) MCG/ACT inhaler 18 g 1    Sig: Inhale 2 puffs into the lungs every 6 (six) hours as needed for wheezing or shortness of breath.     Pulmonology:  Beta Agonists 2 Failed - 04/15/2022 10:08 AM      Failed - Last BP in normal range    BP Readings from Last 1 Encounters:  03/24/22 (!) 144/91         Passed - Last Heart Rate in normal range    Pulse Readings from Last 1 Encounters:  03/24/22 82         Passed - Valid encounter within last 12 months    Recent Outpatient Visits           3 weeks ago Chronic left shoulder pain   [facility] Jon Billings, NP   1 month ago Chronic diastolic congestive heart failure ([place])   [facility] Jon Billings, NP   3 months ago Primary hypertension   [place], Santiago Glad, NP   4 months ago Chronic diastolic congestive heart failure ([place])   [facility] Jon Billings, NP   8 months ago Primary  hypertension   [place], NP       Future Appointments             In 1 month Jon Billings, NP [facility]             Iron, Ferrous Sulfate, 325 (65 Fe) MG TABS 30 tablet 1    Sig: Take 325 mg by mouth daily.     Endocrinology:  Minerals - Iron Supplementation Failed - 04/15/2022 10:08 AM      Failed - HGB in normal range and within 360 days    Hemoglobin  Date Value Ref Range Status  03/10/2022 11.1 (L) 13.0 - 17.7 g/dL Final         Failed - HCT in normal range and within 360 days    Hematocrit  Date Value Ref Range Status  03/10/2022 32.6 (L) 37.5 - 51.0 % Final         Failed - RBC in normal range and within 360 days    RBC  Date Value Ref Range Status  03/10/2022 3.53 (L) 4.14 - 5.80 x10E6/uL Final  12/25/2021 4.08 (L) 4.22 - 5.81 MIL/uL Final         Passed - Fe (serum) in normal range and within 360 days    Iron  Date Value Ref Range Status  03/10/2022 58 38 - 169 ug/dL Final   Iron Saturation  Date Value Ref Range Status  03/10/2022 18 15 - 55 % Final         Passed - Ferritin in normal range and within 360 days  Ferritin  Date Value Ref Range Status  03/10/2022 116 30 - 400 ng/mL Final         Passed - Valid encounter within last 12 months    Recent Outpatient Visits           3 weeks ago Chronic left shoulder pain   [place], NP   1 month ago Chronic diastolic congestive heart failure [facility])   [facility] Jon Billings, NP   3 months ago Primary hypertension   [facility] Jon Billings, NP   4 months ago Chronic diastolic congestive heart failure [facility])   [facility] Jon Billings, NP   8 months ago Primary hypertension   [facility] Jon Billings, NP       Future Appointments             In 1 month Jon Billings, NP [facility] [place]

## 2022-04-18 MED ORDER — EMPAGLIFLOZIN 10 MG PO TABS: 10.0000 mg | ORAL_TABLET | Freq: Every day | ORAL | 1 refills | Status: DC

## 2022-04-18 MED ORDER — LISINOPRIL 40 MG PO TABS: 40.0000 mg | ORAL_TABLET | Freq: Every day | ORAL | 1 refills | Status: DC

## 2022-04-18 MED ORDER — FUROSEMIDE 40 MG PO TABS: 40.0000 mg | ORAL_TABLET | Freq: Every day | ORAL | 1 refills | Status: DC

## 2022-04-18 MED ORDER — CARVEDILOL 25 MG PO TABS: 25.0000 mg | ORAL_TABLET | Freq: Two times a day (BID) | ORAL | 1 refills | Status: DC

## 2022-04-18 MED ORDER — AMLODIPINE BESYLATE 10 MG PO TABS: 10.0000 mg | ORAL_TABLET | Freq: Every day | ORAL | 1 refills | Status: DC

## 2022-04-18 MED ORDER — HYDRALAZINE HCL 50 MG PO TABS
50.0000 mg | ORAL_TABLET | Freq: Three times a day (TID) | ORAL | 1 refills | Status: DC
Start: 2022-04-18 — End: 2022-11-18

## 2022-04-18 MED ORDER — CARBAMAZEPINE 200 MG PO TABS: 200.0000 mg | ORAL_TABLET | Freq: Two times a day (BID) | ORAL | 0 refills | Status: DC

## 2022-04-26 DIAGNOSIS — D500 Iron deficiency anemia secondary to blood loss (chronic): Secondary | ICD-10-CM | POA: Diagnosis not present

## 2022-04-26 DIAGNOSIS — I5032 Chronic diastolic (congestive) heart failure: Secondary | ICD-10-CM | POA: Diagnosis not present

## 2022-04-26 DIAGNOSIS — F10920 Alcohol use, unspecified with intoxication, uncomplicated: Secondary | ICD-10-CM | POA: Diagnosis not present

## 2022-04-26 DIAGNOSIS — I509 Heart failure, unspecified: Secondary | ICD-10-CM | POA: Diagnosis not present

## 2022-04-27 ENCOUNTER — Ambulatory Visit: Payer: BC Managed Care – PPO | Attending: Pulmonary Disease

## 2022-04-27 DIAGNOSIS — J449 Chronic obstructive pulmonary disease, unspecified: Secondary | ICD-10-CM | POA: Diagnosis not present

## 2022-04-27 LAB — PULMONARY FUNCTION TEST ARMC ONLY
DL/VA % pred: 78 %
DL/VA: 3.44 ml/min/mmHg/L
DLCO unc % pred: 39 %
DLCO unc: 11.42 ml/min/mmHg
FEF 25-75 Post: 0.61 L/sec
FEF 25-75 Pre: 1.03 L/sec
FEF2575-%Change-Post: -40 %
FEF2575-%Pred-Post: 18 %
FEF2575-%Pred-Pre: 31 %
FEV1-%Change-Post: -6 %
FEV1-%Pred-Post: 40 %
FEV1-%Pred-Pre: 43 %
FEV1-Post: 1.35 L
FEV1-Pre: 1.43 L
FEV1FVC-%Change-Post: 14 %
FEV1FVC-%Pred-Pre: 75 %
FEV6-%Change-Post: -18 %
FEV6-%Pred-Post: 48 %
FEV6-%Pred-Pre: 59 %
FEV6-Post: 1.95 L
FEV6-Pre: 2.39 L
FEV6FVC-%Pred-Post: 103 %
FEV6FVC-%Pred-Pre: 103 %
FVC-%Change-Post: -18 %
FVC-%Pred-Post: 46 %
FVC-%Pred-Pre: 57 %
FVC-Post: 1.95 L
FVC-Pre: 2.39 L
Post FEV1/FVC ratio: 69 %
Post FEV6/FVC ratio: 100 %
Pre FEV1/FVC ratio: 60 %
Pre FEV6/FVC Ratio: 100 %
RV % pred: 130 %
RV: 2.72 L
TLC % pred: 70 %
TLC: 4.93 L

## 2022-05-05 ENCOUNTER — Ambulatory Visit (INDEPENDENT_AMBULATORY_CARE_PROVIDER_SITE_OTHER): Payer: BC Managed Care – PPO | Admitting: Pulmonary Disease

## 2022-05-05 ENCOUNTER — Encounter: Payer: Self-pay | Admitting: Pulmonary Disease

## 2022-05-05 VITALS — BP 138/82 | HR 78 | Temp 97.6°F | Ht 70.0 in | Wt 197.8 lb

## 2022-05-05 DIAGNOSIS — R569 Unspecified convulsions: Secondary | ICD-10-CM

## 2022-05-05 DIAGNOSIS — F1010 Alcohol abuse, uncomplicated: Secondary | ICD-10-CM | POA: Diagnosis not present

## 2022-05-05 DIAGNOSIS — J449 Chronic obstructive pulmonary disease, unspecified: Secondary | ICD-10-CM

## 2022-05-05 DIAGNOSIS — F17200 Nicotine dependence, unspecified, uncomplicated: Secondary | ICD-10-CM | POA: Diagnosis not present

## 2022-05-05 MED ORDER — TRELEGY ELLIPTA 100-62.5-25 MCG/ACT IN AEPB: 1.0000 | INHALATION_SPRAY | Freq: Every day | RESPIRATORY_TRACT | 0 refills | Status: DC

## 2022-05-05 NOTE — Progress Notes (Signed)
Subjective:    Patient ID: Jose [name]., male    DOB: 12/18/1970, 51 y.o.   MRN: [id number] Patient Care Team: Jon Billings, NP as PCP - General Greg Cutter, LCSW as Social Worker (Licensed Clinical Social Worker)  Chief Complaint  Patient presents with   Follow-up   HPI Patient is a 51 year old current cigar smoker with 33-pack-year history of cigarette smoking who presents for follow-up on the issue of shortness of breath.  Initially seen here on 18 February 2022 this is a follow-up scheduled visit.  Patient was given a trial of Trelegy Ellipta which she states helps however he does not use this every day.  He instead relies on use of albuterol several times a day.  He did use Trelegy a few days back because he felt "bad" and states that the Trelegy helped him.  Cannot explain why he is not taking his Trelegy daily.  He still continues to drink approximately a half a pint of liquor per day, sometimes more.  He had PFTs on 21 June that show that he has stage III COPD.  Patient presents with his mother today.  We addressed the issues of concern with regards to patient's smoking and drinking.  On his initial visit he was referred to neurology for management of his antiseizure medicines and follow-up on seizures however he never made an appointment.  Patient's mother states that she will see to it that he will do this.  He is also now having some issues with mild memory impairment which may be related to his daily alcohol use.  No other concerns are expressed today.  DATA 04/27/2022 PFTs: FEV1 1.43 L or 43% predicted, FVC 2.39 L or 57% predicted, FEV1/FVC 60%, no bronchodilator response.  There is significant air trapping.  Mild restrictive physiology on the basis of obesity (ERV 37%) diffusion capacity moderately reduced.  Consistent with severe obstructive defect with concomitant mild restrictive defect on the basis of obesity.  Review of Systems A 10 point review of systems was  performed and it is as noted above otherwise negative.  Patient Active Problem List   Diagnosis Date Noted   Acute CHF (congestive heart failure) ([place]) 89/37/3428   Alcoholic intoxication without complication ([place]) 76/81/1572   Chronic diastolic congestive heart failure ([place]) 09/08/2020   Iron deficiency anemia due to chronic blood loss    Family history of colon cancer 06/28/2016   Seizures ([place]) 04/30/2015   Hypertension 04/30/2015   Chronic alcohol abuse 04/30/2015   Epilepsy ([place]) 04/30/2015   Tobacco dependence 04/30/2015   COPD (chronic obstructive pulmonary disease) ([place]) 62/01/5596   Uncomplicated asthma 41/63/8453   Lumbago 04/30/2015   Hypercholesterolemia 04/30/2015   Chronic pain 04/30/2015   Neck pain 04/22/2013   Social History   Tobacco Use   Smoking status: Every Day    Packs/day: 1.00    Years: 33.00    Total pack years: 33.00    Types: Cigars, Cigarettes    Start date: 31    Last attempt to quit: 2020    Years since quitting: 3.4   Smokeless tobacco: Former   Tobacco comments:    2 cigars a day 02/18/22 hfb    Quit smoking cigarettes 3 years ago.  Smokes cigarettes since he was 51 years old.  Substance Use Topics   Alcohol use: Yes    Alcohol/week: 28.0 standard drinks of alcohol    Types: 7 Cans of beer, 21 Shots of liquor per week  Comment: half pint liquor per day   No Known Allergies  Current Meds  Medication Sig   albuterol (VENTOLIN HFA) 108 (90 Base) MCG/ACT inhaler Inhale 2 puffs into the lungs every 6 (six) hours as needed for wheezing or shortness of breath.   carbamazepine (TEGRETOL) 200 MG tablet Take 1 tablet (200 mg total) by mouth 2 (two) times daily.   carvedilol (COREG) 25 MG tablet Take 1 tablet (25 mg total) by mouth 2 (two) times daily with a meal.   empagliflozin (JARDIANCE) 10 MG TABS tablet Take 1 tablet (10 mg total) by mouth daily before breakfast.   Fluticasone-Umeclidin-Vilant (TRELEGY ELLIPTA) 100-62.5-25 MCG/ACT AEPB  Inhale 1 puff into the lungs daily.   furosemide (LASIX) 40 MG tablet Take 1 tablet (40 mg total) by mouth daily. Increased from 20 mg daily.   hydrALAZINE (APRESOLINE) 50 MG tablet Take 1 tablet (50 mg total) by mouth 3 (three) times daily.   Iron, Ferrous Sulfate, 325 (65 Fe) MG TABS Take 325 mg by mouth daily.   lisinopril (ZESTRIL) 40 MG tablet Take 1 tablet (40 mg total) by mouth daily.   potassium chloride (MICRO-K) 10 MEQ CR capsule Take 20 mEq by mouth daily.   rosuvastatin (CRESTOR) 20 MG tablet Take 1 tablet (20 mg total) by mouth daily.   Immunization History  Administered Date(s) Administered   Influenza-Unspecified 08/22/2019   PFIZER(Purple Top)SARS-COV-2 Vaccination 09/14/2020, 10/19/2020      Objective:   Physical Exam BP 138/82 (BP Location: Left Arm, Cuff Size: Normal)   Pulse 78   Temp 97.6 F (36.4 C) (Temporal)   Ht '5\' 10"'$  (1.778 m)   Wt 197 lb 12.8 oz (89.7 kg)   SpO2 94%   BMI 28.38 kg/m  GENERAL: Well-developed, overweight gentleman, no acute distress, no gait disturbance, very nasal quality to speech.  No conversational dyspnea. HEAD: Normocephalic, atraumatic.  EYES: Pupils equal, round, reactive to light.  No scleral icterus.  MOUTH: Poor dentition, oral mucosa moist. NECK: Supple. No thyromegaly. Trachea midline. No JVD.  No adenopathy. PULMONARY: Good air entry bilaterally.  Wheezing and rhonchi throughout. CARDIOVASCULAR: S1 and S2. Regular rate and rhythm.  No rubs, murmurs or gallops heard. ABDOMEN: Benign. MUSCULOSKELETAL: No joint deformity, mild early clubbing, no edema.  NEUROLOGIC: No overt focal deficit, no gait disturbance, speech is fluent. SKIN: Intact,warm,dry. PSYCH: Mood and behavior normal.      Assessment & Plan:     ICD-10-CM   1. Stage 3 severe COPD by GOLD classification ([place])  J44.9    Trelegy Ellipta 100, 1 inhalation daily Continue as needed albuterol    2. Seizures ([place])  R56.9 Ambulatory referral to Neurology   Is  maintained on Tegretol Advised re: ongoing alcohol use and seizures Patient never made appointment with neurology Resubmit referral to neurology    3. Alcohol abuse  F10.10    Patient was advised that alcohol lowers seizure threshold Counseled regards to discontinuing alcohol/seek counseling    4. Tobacco dependence  F17.200 Ambulatory Referral for Lung Cancer Scre   Prior cigarette smoker quit 3 years 30-pack-year history smoking Currently cigar smoker Refer to lung cancer screening     Meds ordered this encounter  Medications   Fluticasone-Umeclidin-Vilant (TRELEGY ELLIPTA) 100-62.5-25 MCG/ACT AEPB    Sig: Inhale 1 puff into the lungs daily.    Dispense:  14 each    Refill:  0    Order Specific Question:   Lot Number?    Answer:   Johny Blamer  Order Specific Question:   Expiration Date?    Answer:   05/08/2023    Order Specific Question:   Manufacturer?    Answer:   [employer] [[id number]]    Order Specific Question:   Quantity    Answer:   2   Patient appears to be well compensated with regards to his COPD.  We will see him in follow-up in 4 months time he is to contact us prior to that time should any new difficulties arise.   Renold Don, MD Advanced Bronchoscopy [facility]    *This note was dictated using voice recognition software/Dragon.  Despite best efforts to proofread, errors can occur which can change the meaning. Any transcriptional errors that result from this process are unintentional and may not be fully corrected at the time of dictation.

## 2022-05-05 NOTE — Patient Instructions (Signed)
Please use your Trelegy every day 1 puff daily.  Make sure you rinse your mouth well after you use it.  You may continue to use your albuterol as needed.  We have sent another referral for the neurologist so that they can manage your seizure medication.  We are referring you to the lung cancer screening program.  We will see you in follow-up in 4 months time call sooner should any new problems arise.

## 2022-05-11 ENCOUNTER — Telehealth: Payer: Self-pay

## 2022-05-11 ENCOUNTER — Ambulatory Visit: Payer: Self-pay

## 2022-05-11 NOTE — Telephone Encounter (Signed)
Copied from [place] [id number]. Topic: Referral - Question >> May 11, 2022  8:39 AM [name] wrote: Reason for CRM: Patient would like a call regarding opening the referral to the orthopedic because the chart reflects it being closed.  Informed patient the specialist reached out twice but was unsuccessful.   Jose [name], can this referral be re-opened or do we need to do another referral?

## 2022-05-11 NOTE — Telephone Encounter (Signed)
  Chief Complaint: numbness Symptoms: numbness in L hand 4th and 5th digit, slight pain and swelling Frequency: 2 months Pertinent Negatives: NA Disposition: '[]'$ ED /'[]'$ Urgent Care (no appt availability in office) / '[x]'$ Appointment(In office/virtual)/ '[]'$  [place] [facility]/ '[]'$ Home Care/ '[]'$ Refused Recommended Disposition /'[]'$ [facility]/ '[]'$  Follow-up with PCP Additional Notes: pt called in concerned and wanted an appt. Scheduled appt for 05/12/22 at 65 with [place], [place]. Pt said his cousin lost 2 of his fingers and has spells where he just falls out and pt doesn't want this to happen to him.   Reason for Disposition  [1] Numbness or tingling in one or both hands AND [2] is a chronic symptom (recurrent or ongoing AND present > 4 weeks)  Answer Assessment - Initial Assessment Questions 1. SYMPTOM: "What is the main symptom you are concerned about?" (e.g., weakness, numbness)     numbness 2. ONSET: "When did this start?" (minutes, hours, days; while sleeping)     2 months  4. PATTERN "Does this come and go, or has it been constant since it started?"  "Is it present now?"     Constant  7. OTHER SYMPTOMS: "Do you have any other symptoms?"     L ring and pinky finger with slight pain slight swelling  Protocols used: Neurologic Deficit-A-AH

## 2022-05-12 ENCOUNTER — Ambulatory Visit: Payer: BC Managed Care – PPO | Admitting: Physician Assistant

## 2022-05-12 NOTE — Progress Notes (Deleted)
Established Patient Office Visit  Name: Jose [name].   MRN: [id number]    DOB: [birth year]   Date:05/12/2022  Today's Provider: Talitha Givens, MHS, PA-C Introduced myself to the patient as a PA-C and provided education on APPs in clinical practice.         Subjective  Chief Complaint  No chief complaint on file.   HPI  Numbness in left hand - 4th and 5th digits Patient is heavy drinker- b1? Also appears to be prediabetic- last A1c was in April - 5.7 B12 in May was 332      Patient Active Problem List   Diagnosis Date Noted   Acute CHF (congestive heart failure) ([place]) 01/60/1093   Alcoholic intoxication without complication ([place]) 23/55/7322   Chronic diastolic congestive heart failure ([place]) 09/08/2020   Iron deficiency anemia due to chronic blood loss    Family history of colon cancer 06/28/2016   Seizures ([place]) 04/30/2015   Hypertension 04/30/2015   Chronic alcohol abuse 04/30/2015   Epilepsy ([place]) 04/30/2015   Tobacco dependence 04/30/2015   COPD (chronic obstructive pulmonary disease) ([place]) 02/54/2706   Uncomplicated asthma 23/76/2831   Lumbago 04/30/2015   Hypercholesterolemia 04/30/2015   Chronic pain 04/30/2015   Neck pain 04/22/2013    Past Surgical History:  Procedure Laterality Date   COLONOSCOPY WITH PROPOFOL N/A 01/16/2018   Procedure: COLONOSCOPY WITH PROPOFOL;  Surgeon: Lin Landsman, MD;  Location: [facility] ENDOSCOPY;  Service: Gastroenterology;  Laterality: N/A;   ESOPHAGOGASTRODUODENOSCOPY (EGD) WITH PROPOFOL N/A 01/16/2018   Procedure: ESOPHAGOGASTRODUODENOSCOPY (EGD) WITH PROPOFOL;  Surgeon: Lin Landsman, MD;  Location: [facility] ENDOSCOPY;  Service: Gastroenterology;  Laterality: N/A;    Family History  Problem Relation Age of Onset   Diabetes Mother    Colon cancer Mother    Hypertension Father    Multiple sclerosis Father     Social History   Tobacco Use   Smoking status: Every Day    Packs/day: 1.00    Years: 33.00     Total pack years: 33.00    Types: Cigars, Cigarettes    Start date: 61    Last attempt to quit: 2020    Years since quitting: 3.5   Smokeless tobacco: Former   Tobacco comments:    2 cigars a day-05/05/2022    Quit smoking cigarettes 3 years ago.  Smokes cigarettes since he was 51 years old.  Substance Use Topics   Alcohol use: Yes    Alcohol/week: 28.0 standard drinks of alcohol    Types: 7 Cans of beer, 21 Shots of liquor per week    Comment: half pint liquor per day     Current Outpatient Medications:    albuterol (VENTOLIN HFA) 108 (90 Base) MCG/ACT inhaler, Inhale 2 puffs into the lungs every 6 (six) hours as needed for wheezing or shortness of breath., Disp: 18 g, Rfl: 1   amLODipine (NORVASC) 10 MG tablet, Take 1 tablet (10 mg total) by mouth daily. (Patient not taking: Reported on 05/05/2022), Disp: 90 tablet, Rfl: 1   carbamazepine (TEGRETOL) 200 MG tablet, Take 1 tablet (200 mg total) by mouth 2 (two) times daily., Disp: 60 tablet, Rfl: 0   carvedilol (COREG) 25 MG tablet, Take 1 tablet (25 mg total) by mouth 2 (two) times daily with a meal., Disp: 180 tablet, Rfl: 1   empagliflozin (JARDIANCE) 10 MG TABS tablet, Take 1 tablet (10 mg total) by mouth daily before breakfast., Disp: 90  tablet, Rfl: 1   Fluticasone-Umeclidin-Vilant (TRELEGY ELLIPTA) 100-62.5-25 MCG/ACT AEPB, Inhale 1 puff into the lungs daily., Disp: 28 each, Rfl: 11   Fluticasone-Umeclidin-Vilant (TRELEGY ELLIPTA) 100-62.5-25 MCG/ACT AEPB, Inhale 1 puff into the lungs daily., Disp: 14 each, Rfl: 0   furosemide (LASIX) 40 MG tablet, Take 1 tablet (40 mg total) by mouth daily. Increased from 20 mg daily., Disp: 90 tablet, Rfl: 1   hydrALAZINE (APRESOLINE) 50 MG tablet, Take 1 tablet (50 mg total) by mouth 3 (three) times daily., Disp: 270 tablet, Rfl: 1   Iron, Ferrous Sulfate, 325 (65 Fe) MG TABS, Take 325 mg by mouth daily., Disp: 30 tablet, Rfl: 1   isosorbide mononitrate (IMDUR) 30 MG 24 hr tablet, Take 1 tablet  (30 mg total) by mouth daily. (Patient not taking: Reported on 05/05/2022), Disp: 90 tablet, Rfl: 1   lisinopril (ZESTRIL) 40 MG tablet, Take 1 tablet (40 mg total) by mouth daily., Disp: 90 tablet, Rfl: 1   potassium chloride (MICRO-K) 10 MEQ CR capsule, Take 20 mEq by mouth daily., Disp: , Rfl:    rosuvastatin (CRESTOR) 20 MG tablet, Take 1 tablet (20 mg total) by mouth daily., Disp: 90 tablet, Rfl: 1  No Known Allergies  I personally reviewed {Reviewed:14835} with the patient/caregiver today.   ROS    Objective  There were no vitals filed for this visit.  There is no height or weight on file to calculate BMI.  Physical Exam   Recent Results (from the past 2160 hour(s))  CBC w/Diff     Status: Abnormal   Collection Time: 02/24/22  4:32 PM  Result Value Ref Range   WBC 7.5 3.4 - 10.8 x10E3/uL   RBC 3.66 (L) 4.14 - 5.80 x10E6/uL   Hemoglobin 11.5 (L) 13.0 - 17.7 g/dL   Hematocrit 32.7 (L) 37.5 - 51.0 %   MCV 89 79 - 97 fL   MCH 31.4 26.6 - 33.0 pg   MCHC 35.2 31.5 - 35.7 g/dL   RDW 14.5 11.6 - 15.4 %   Platelets 118 (L) 150 - 450 x10E3/uL   Neutrophils 65 Not Estab. %   Lymphs 19 Not Estab. %   Monocytes 13 Not Estab. %   Eos 2 Not Estab. %   Basos 1 Not Estab. %   Neutrophils Absolute 4.9 1.4 - 7.0 x10E3/uL   Lymphocytes Absolute 1.4 0.7 - 3.1 x10E3/uL   Monocytes Absolute 1.0 (H) 0.1 - 0.9 x10E3/uL   EOS (ABSOLUTE) 0.1 0.0 - 0.4 x10E3/uL   Basophils Absolute 0.0 0.0 - 0.2 x10E3/uL   Immature Granulocytes 0 Not Estab. %   Immature Grans (Abs) 0.0 0.0 - 0.1 x10E3/uL   NRBC 1 (H) 0 - 0 %  Basic Metabolic Panel (BMET)     Status: Abnormal   Collection Time: 02/24/22  4:32 PM  Result Value Ref Range   Glucose 94 70 - 99 mg/dL   BUN 12 6 - 24 mg/dL   Creatinine, Ser 0.67 (L) 0.76 - 1.27 mg/dL   eGFR 112 >59 mL/min/1.73   BUN/Creatinine Ratio 18 9 - 20   Sodium 140 134 - 144 mmol/L   Potassium 4.0 3.5 - 5.2 mmol/L   Chloride 101 96 - 106 mmol/L   CO2 22 20 - 29  mmol/L   Calcium 9.2 8.7 - 10.2 mg/dL  HgB A1c     Status: Abnormal   Collection Time: 02/24/22  4:32 PM  Result Value Ref Range   Hgb A1c MFr Bld 5.7 (H)  4.8 - 5.6 %    Comment:          Prediabetes: 5.7 - 6.4          Diabetes: >6.4          Glycemic control for adults with diabetes: <7.0    Est. average glucose Bld gHb Est-mCnc 117 mg/dL  Anemia Profile B     Status: Abnormal   Collection Time: 02/28/22  2:45 PM  Result Value Ref Range   Total Iron Binding Capacity 301 250 - 450 ug/dL   UIBC 262 111 - 343 ug/dL   Iron 39 38 - 169 ug/dL   Iron Saturation 13 (L) 15 - 55 %   Ferritin 87 30 - 400 ng/mL   Vitamin B-12 244 232 - 1,245 pg/mL   Folate 2.3 (L) >3.0 ng/mL    Comment: A serum folate concentration of less than 3.1 ng/mL is considered to represent clinical deficiency.    WBC 5.5 3.4 - 10.8 x10E3/uL   RBC 3.42 (L) 4.14 - 5.80 x10E6/uL   Hemoglobin 10.5 (L) 13.0 - 17.7 g/dL   Hematocrit 31.0 (L) 37.5 - 51.0 %   MCV 91 79 - 97 fL   MCH 30.7 26.6 - 33.0 pg   MCHC 33.9 31.5 - 35.7 g/dL   RDW 14.8 11.6 - 15.4 %   Platelets 131 (L) 150 - 450 x10E3/uL   Neutrophils 46 Not Estab. %   Lymphs 38 Not Estab. %   Monocytes 12 Not Estab. %   Eos 2 Not Estab. %   Basos 1 Not Estab. %   Neutrophils Absolute 2.5 1.4 - 7.0 x10E3/uL   Lymphocytes Absolute 2.1 0.7 - 3.1 x10E3/uL   Monocytes Absolute 0.7 0.1 - 0.9 x10E3/uL   EOS (ABSOLUTE) 0.1 0.0 - 0.4 x10E3/uL   Basophils Absolute 0.1 0.0 - 0.2 x10E3/uL   Immature Granulocytes 1 Not Estab. %   Immature Grans (Abs) 0.1 0.0 - 0.1 x10E3/uL   NRBC 1 (H) 0 - 0 %   Retic Ct Pct 3.3 (H) 0.6 - 2.6 %  Anemia Profile B     Status: Abnormal   Collection Time: 03/10/22  4:07 PM  Result Value Ref Range   Total Iron Binding Capacity 320 250 - 450 ug/dL   UIBC 262 111 - 343 ug/dL   Iron 58 38 - 169 ug/dL   Iron Saturation 18 15 - 55 %   Ferritin 116 30 - 400 ng/mL   Vitamin B-12 332 232 - 1,245 pg/mL   Folate 5.2 >3.0 ng/mL    Comment: A  serum folate concentration of less than 3.1 ng/mL is considered to represent clinical deficiency.    WBC 6.8 3.4 - 10.8 x10E3/uL   RBC 3.53 (L) 4.14 - 5.80 x10E6/uL   Hemoglobin 11.1 (L) 13.0 - 17.7 g/dL   Hematocrit 32.6 (L) 37.5 - 51.0 %   MCV 92 79 - 97 fL   MCH 31.4 26.6 - 33.0 pg   MCHC 34.0 31.5 - 35.7 g/dL   RDW 15.4 11.6 - 15.4 %   Platelets 146 (L) 150 - 450 x10E3/uL   Neutrophils 61 Not Estab. %   Lymphs 21 Not Estab. %   Monocytes 14 Not Estab. %   Eos 2 Not Estab. %   Basos 1 Not Estab. %   Neutrophils Absolute 4.2 1.4 - 7.0 x10E3/uL   Lymphocytes Absolute 1.4 0.7 - 3.1 x10E3/uL   Monocytes Absolute 0.9 0.1 - 0.9 x10E3/uL  EOS (ABSOLUTE) 0.1 0.0 - 0.4 x10E3/uL   Basophils Absolute 0.1 0.0 - 0.2 x10E3/uL   Immature Granulocytes 1 Not Estab. %   Immature Grans (Abs) 0.0 0.0 - 0.1 x10E3/uL   Retic Ct Pct 2.7 (H) 0.6 - 2.6 %     PHQ2/9:    03/24/2022    3:24 PM 02/24/2022    4:02 PM 01/03/2022   11:35 AM 11/30/2021   11:53 AM 11/05/2021    9:13 AM  Depression screen PHQ 2/9  Decreased Interest 0 0 1 0 0  Down, Depressed, Hopeless 0 0 0 0 0  PHQ - 2 Score 0 0 1 0 0  Altered sleeping 0 0 0    Tired, decreased energy '1 1 1    ' Change in appetite 0 0 0    Feeling bad or failure about yourself  0 0 0    Trouble concentrating 0 0 0    Moving slowly or fidgety/restless 0 0 0    Suicidal thoughts 0 0 0    PHQ-9 Score '1 1 2    ' Difficult doing work/chores Somewhat difficult Not difficult at all Not difficult at all        Fall Risk:    03/24/2022    3:24 PM 03/01/2022    3:54 PM 02/24/2022    4:02 PM 02/01/2022    3:52 PM 01/03/2022   11:35 AM  Fall Risk   Falls in the past year? 1 0 0 0 1  Number falls in past yr: 0 0 0 0 1  Injury with Fall? 0 0 0 0 1  Risk for fall due to : History of fall(s)  No Fall Risks  History of fall(s)  Follow up Falls evaluation completed Falls evaluation completed Falls evaluation completed Falls evaluation completed Falls evaluation  completed      Functional Status Survey:      Assessment & Plan

## 2022-05-26 ENCOUNTER — Encounter: Payer: Self-pay | Admitting: Nurse Practitioner

## 2022-05-26 ENCOUNTER — Ambulatory Visit (INDEPENDENT_AMBULATORY_CARE_PROVIDER_SITE_OTHER): Payer: BC Managed Care – PPO | Admitting: Nurse Practitioner

## 2022-05-26 VITALS — BP 113/70 | HR 79 | Temp 98.5°F | Wt 200.0 lb

## 2022-05-26 DIAGNOSIS — J449 Chronic obstructive pulmonary disease, unspecified: Secondary | ICD-10-CM

## 2022-05-26 DIAGNOSIS — D500 Iron deficiency anemia secondary to blood loss (chronic): Secondary | ICD-10-CM

## 2022-05-26 DIAGNOSIS — R7303 Prediabetes: Secondary | ICD-10-CM | POA: Diagnosis not present

## 2022-05-26 DIAGNOSIS — I5032 Chronic diastolic (congestive) heart failure: Secondary | ICD-10-CM | POA: Diagnosis not present

## 2022-05-26 DIAGNOSIS — I10 Essential (primary) hypertension: Secondary | ICD-10-CM

## 2022-05-26 DIAGNOSIS — G40909 Epilepsy, unspecified, not intractable, without status epilepticus: Secondary | ICD-10-CM

## 2022-05-26 DIAGNOSIS — R569 Unspecified convulsions: Secondary | ICD-10-CM

## 2022-05-26 DIAGNOSIS — I509 Heart failure, unspecified: Secondary | ICD-10-CM | POA: Diagnosis not present

## 2022-05-26 DIAGNOSIS — F10920 Alcohol use, unspecified with intoxication, uncomplicated: Secondary | ICD-10-CM | POA: Diagnosis not present

## 2022-05-26 MED ORDER — ISOSORBIDE MONONITRATE ER 30 MG PO TB24: 30.0000 mg | ORAL_TABLET | Freq: Every day | ORAL | 1 refills | Status: DC

## 2022-05-26 MED ORDER — FUROSEMIDE 40 MG PO TABS: 40.0000 mg | ORAL_TABLET | Freq: Every day | ORAL | 1 refills | Status: DC

## 2022-05-26 MED ORDER — ROSUVASTATIN CALCIUM 20 MG PO TABS: 20.0000 mg | ORAL_TABLET | Freq: Every day | ORAL | 1 refills | Status: DC

## 2022-05-26 MED ORDER — EMPAGLIFLOZIN 10 MG PO TABS: 10.0000 mg | ORAL_TABLET | Freq: Every day | ORAL | 1 refills | Status: DC

## 2022-05-26 MED ORDER — CARBAMAZEPINE 200 MG PO TABS: 200.0000 mg | ORAL_TABLET | Freq: Two times a day (BID) | ORAL | 1 refills | Status: DC

## 2022-05-26 NOTE — Assessment & Plan Note (Signed)
Labs ordered today.  Will make recommendations based on lab results. ?

## 2022-05-26 NOTE — Assessment & Plan Note (Signed)
Chronic. Has an appt with Neurology in October.  Keep appt. For October.  Tegretol ordered.

## 2022-05-26 NOTE — Patient Instructions (Signed)
[facility] [phone]

## 2022-05-26 NOTE — Assessment & Plan Note (Signed)
Chronic. Saw Pulmonology who started him on Trellegy and ordered PFTs.  Discussed smoking cessation with patient during visit.  Patient endorses proper use of Trellegy. Follow up in 3 months for reevaluation.

## 2022-05-26 NOTE — Assessment & Plan Note (Signed)
Chronic.  Controlled.  Continue with current medication regimen.  Continue to collaborate with Cardiology.  Labs ordered today.  Return to clinic in 3 months for reevaluation.  Call sooner if concerns arise.   

## 2022-05-26 NOTE — Assessment & Plan Note (Signed)
Chronic.  Controlled. Patient followed up with Heart Failure clinic last month. Patient endorses taking all Cardiac medications.  Continue with follow up with HF clinic.  Labs ordered today.  Appears Euvolemic.  Follow up in 3 months. Call sooner if concerns arise.  - Reminded to call for an overnight weight gain of >2 pounds or a weekly weight gain of >5 pounds - not adding salt to food and read food labels. Reviewed the importance of keeping daily sodium intake to '2000mg'$  daily. - Avoid Ibuprofen products.

## 2022-05-26 NOTE — Progress Notes (Signed)
BP 113/70   Pulse 79   Temp 98.5 F (36.9 C) (Oral)   Wt 200 lb (90.7 kg)   SpO2 97%   BMI 28.70 kg/m    Subjective:    Patient ID: Jose Dakins., male    DOB: 02-01-1971, 51 y.o.   MRN: [id number]  HPI: Jose Leider. is a 51 y.o. male  Chief Complaint  Patient presents with   Hypertension   Hyperlipidemia   Diabetes   HYPERTENSION Hypertension status: controlled  Satisfied with current treatment? yes Duration of hypertension: years BP monitoring frequency:  not checking BP range:  BP medication side effects:  no Medication compliance: excellent compliance Previous BP meds: hydralazine, amlodipine, carvedilol, and lisinopril Aspirin: no Recurrent headaches: no Visual changes: no Palpitations: no Dyspnea: no Chest pain: no Lower extremity edema: no Dizzy/lightheaded: no  COPD COPD status: stable Satisfied with current treatment?: yes Oxygen use: no Dyspnea frequency: yes Cough frequency:  Rescue inhaler frequency:   Limitation of activity: yes Productive cough:  Last Spirometry:  Pneumovax: Up to Date Influenza: Up to Date Has started Trelegy which he states it is helping.  He does still have SOB.   CHF Patient is followed by Jose [name] has an appt with her next month.  Patient states he weighs himself at work.   He has an appt with Neurology appt in October.   Relevant past medical, surgical, family and social history reviewed and updated as indicated. Interim medical history since our last visit reviewed. Allergies and medications reviewed and updated.  Review of Systems  Eyes:  Negative for visual disturbance.  Respiratory:  Positive for shortness of breath.   Cardiovascular:  Negative for chest pain and leg swelling.  Neurological:  Negative for light-headedness and headaches.    Per HPI unless specifically indicated above     Objective:    BP 113/70   Pulse 79   Temp 98.5 F (36.9 C) (Oral)   Wt 200 lb (90.7 kg)   SpO2 97%   BMI  28.70 kg/m   Wt Readings from Last 3 Encounters:  05/26/22 200 lb (90.7 kg)  05/05/22 197 lb 12.8 oz (89.7 kg)  03/24/22 208 lb (94.3 kg)    Physical Exam Vitals and nursing note reviewed.  Constitutional:      General: He is not in acute distress.    Appearance: Normal appearance. He is not ill-appearing, toxic-appearing or diaphoretic.  HENT:     Head: Normocephalic.     Right Ear: External ear normal.     Left Ear: External ear normal.     Nose: Nose normal. No congestion or rhinorrhea.     Mouth/Throat:     Mouth: Mucous membranes are moist.  Eyes:     General:        Right eye: No discharge.        Left eye: No discharge.     Extraocular Movements: Extraocular movements intact.     Conjunctiva/sclera: Conjunctivae normal.     Pupils: Pupils are equal, round, and reactive to light.  Cardiovascular:     Rate and Rhythm: Normal rate and regular rhythm.     Heart sounds: No murmur heard. Pulmonary:     Effort: Pulmonary effort is normal. No respiratory distress.     Breath sounds: Normal breath sounds. No wheezing, rhonchi or rales.  Abdominal:     General: Abdomen is flat. Bowel sounds are normal.  Musculoskeletal:     Cervical back:  Normal range of motion and neck supple.  Skin:    General: Skin is warm and dry.     Capillary Refill: Capillary refill takes less than 2 seconds.  Neurological:     General: No focal deficit present.     Mental Status: He is alert and oriented to person, place, and time.  Psychiatric:        Mood and Affect: Mood normal.        Behavior: Behavior normal.        Thought Content: Thought content normal.        Judgment: Judgment normal.     Results for orders placed or performed in visit on 03/10/22  Anemia Profile B  Result Value Ref Range   Total Iron Binding Capacity 320 250 - 450 ug/dL   UIBC 262 111 - 343 ug/dL   Iron 58 38 - 169 ug/dL   Iron Saturation 18 15 - 55 %   Ferritin 116 30 - 400 ng/mL   Vitamin B-12 332 232 -  1,245 pg/mL   Folate 5.2 >3.0 ng/mL   WBC 6.8 3.4 - 10.8 x10E3/uL   RBC 3.53 (L) 4.14 - 5.80 x10E6/uL   Hemoglobin 11.1 (L) 13.0 - 17.7 g/dL   Hematocrit 32.6 (L) 37.5 - 51.0 %   MCV 92 79 - 97 fL   MCH 31.4 26.6 - 33.0 pg   MCHC 34.0 31.5 - 35.7 g/dL   RDW 15.4 11.6 - 15.4 %   Platelets 146 (L) 150 - 450 x10E3/uL   Neutrophils 61 Not Estab. %   Lymphs 21 Not Estab. %   Monocytes 14 Not Estab. %   Eos 2 Not Estab. %   Basos 1 Not Estab. %   Neutrophils Absolute 4.2 1.4 - 7.0 x10E3/uL   Lymphocytes Absolute 1.4 0.7 - 3.1 x10E3/uL   Monocytes Absolute 0.9 0.1 - 0.9 x10E3/uL   EOS (ABSOLUTE) 0.1 0.0 - 0.4 x10E3/uL   Basophils Absolute 0.1 0.0 - 0.2 x10E3/uL   Immature Granulocytes 1 Not Estab. %   Immature Grans (Abs) 0.0 0.0 - 0.1 x10E3/uL   Retic Ct Pct 2.7 (H) 0.6 - 2.6 %      Assessment & Plan:   Problem List Items Addressed This Visit       Cardiovascular and Mediastinum   Hypertension    Chronic.  Controlled.  Continue with current medication regimen.  Continue to collaborate with Cardiology.  Labs ordered today.  Return to clinic in 3 months for reevaluation.  Call sooner if concerns arise.        Relevant Medications   rosuvastatin (CRESTOR) 20 MG tablet   isosorbide mononitrate (IMDUR) 30 MG 24 hr tablet   furosemide (LASIX) 40 MG tablet   Chronic diastolic congestive heart failure (HCC)    Chronic.  Controlled. Patient followed up with Heart Failure clinic last month. Patient endorses taking all Cardiac medications.  Continue with follow up with HF clinic.  Labs ordered today.  Appears Euvolemic.  Follow up in 3 months. Call sooner if concerns arise.  - Reminded to call for an overnight weight gain of >2 pounds or a weekly weight gain of >5 pounds - not adding salt to food and read food labels. Reviewed the importance of keeping daily sodium intake to <2082m daily. - Avoid Ibuprofen products.       Relevant Medications   rosuvastatin (CRESTOR) 20 MG tablet    isosorbide mononitrate (IMDUR) 30 MG 24 hr tablet   furosemide (  LASIX) 40 MG tablet   Other Relevant Orders   AMB Referral to Community Care Coordinaton     Respiratory   COPD (chronic obstructive pulmonary disease) (HCC)    Chronic. Saw Pulmonology who started him on Trellegy and ordered PFTs.  Discussed smoking cessation with patient during visit.  Patient endorses proper use of Trellegy. Follow up in 3 months for reevaluation.      Relevant Orders   AMB Referral to Community Care Coordinaton     Nervous and Auditory   Epilepsy ([place])    Chronic. Has an appt with Neurology in October.  Keep appt. For October.  Tegretol ordered.       Relevant Medications   carbamazepine (TEGRETOL) 200 MG tablet   Other Relevant Orders   AMB Referral to [facility]     Other   Seizures ([place])    Chronic. Has an appt with Neurology in October.  Keep appt. For October.  Tegretol ordered.       Relevant Medications   carbamazepine (TEGRETOL) 200 MG tablet   Other Relevant Orders   AMB Referral to Community Care Coordinaton   Iron deficiency anemia due to chronic blood loss    Labs ordered today. Will make recommendations based on lab results.      Relevant Orders   CBC w/Diff   Other Visit Diagnoses     Prediabetes    -  Primary   Relevant Orders   Comp Met (CMET)   HgB A1c        Follow up plan: Return in about 3 months (around 08/26/2022) for HTN, HLD, DM2 FU.

## 2022-05-27 ENCOUNTER — Telehealth: Payer: Self-pay | Admitting: *Deleted

## 2022-05-27 LAB — CBC WITH DIFFERENTIAL/PLATELET
Basophils Absolute: 0.1 10*3/uL (ref 0.0–0.2)
Basos: 1 %
EOS (ABSOLUTE): 0.1 10*3/uL (ref 0.0–0.4)
Eos: 2 %
Hematocrit: 34.4 % — ABNORMAL LOW (ref 37.5–51.0)
Hemoglobin: 12.1 g/dL — ABNORMAL LOW (ref 13.0–17.7)
Immature Grans (Abs): 0 10*3/uL (ref 0.0–0.1)
Immature Granulocytes: 0 %
Lymphocytes Absolute: 1.7 10*3/uL (ref 0.7–3.1)
Lymphs: 30 %
MCH: 31.3 pg (ref 26.6–33.0)
MCHC: 35.2 g/dL (ref 31.5–35.7)
MCV: 89 fL (ref 79–97)
Monocytes Absolute: 0.9 10*3/uL (ref 0.1–0.9)
Monocytes: 15 %
Neutrophils Absolute: 2.9 10*3/uL (ref 1.4–7.0)
Neutrophils: 52 %
Platelets: 149 10*3/uL — ABNORMAL LOW (ref 150–450)
RBC: 3.86 x10E6/uL — ABNORMAL LOW (ref 4.14–5.80)
RDW: 12.1 % (ref 11.6–15.4)
WBC: 5.7 10*3/uL (ref 3.4–10.8)

## 2022-05-27 LAB — COMPREHENSIVE METABOLIC PANEL
ALT: 18 IU/L (ref 0–44)
AST: 37 IU/L (ref 0–40)
Albumin/Globulin Ratio: 1.6 (ref 1.2–2.2)
Albumin: 4.4 g/dL (ref 3.8–4.9)
Alkaline Phosphatase: 104 IU/L (ref 44–121)
BUN/Creatinine Ratio: 23 — ABNORMAL HIGH (ref 9–20)
BUN: 16 mg/dL (ref 6–24)
Bilirubin Total: 0.3 mg/dL (ref 0.0–1.2)
CO2: 23 mmol/L (ref 20–29)
Calcium: 9.5 mg/dL (ref 8.7–10.2)
Chloride: 102 mmol/L (ref 96–106)
Creatinine, Ser: 0.69 mg/dL — ABNORMAL LOW (ref 0.76–1.27)
Globulin, Total: 2.8 g/dL (ref 1.5–4.5)
Glucose: 96 mg/dL (ref 70–99)
Potassium: 4.1 mmol/L (ref 3.5–5.2)
Sodium: 141 mmol/L (ref 134–144)
Total Protein: 7.2 g/dL (ref 6.0–8.5)
eGFR: 111 mL/min/{1.73_m2} (ref >59)

## 2022-05-27 LAB — HEMOGLOBIN A1C
Est. average glucose Bld gHb Est-mCnc: 114 mg/dL
Hgb A1c MFr Bld: 5.6 % (ref 4.8–5.6)

## 2022-05-27 NOTE — Progress Notes (Signed)
Please let patient know that his lab work looks great.  No concerns at this time.  Anemia and A1c are stable.  Continue with current medication regimen.  Follow up as discussed.

## 2022-05-27 NOTE — Telephone Encounter (Signed)
   Telephone encounter was:  Successful.  05/27/2022 Name: Jose [name]. MRN: [id number] DOB: 12-17-1970  Jose Dakins. is a 50 y.o. year old male who is a primary care patient of Jon Billings, NP . The community resource team was consulted for assistance with  Disabilty  patient at work left detailed message to return call if he would like the information requested  Care guide performed the following interventions: Patient provided with information about care guide support team and interviewed to confirm resource needs.  Follow Up Plan:  Care guide will follow up with patient by phone over the next days  [place], Care Management  [phone] [address] , [place] [address] Email : [email or web address]

## 2022-05-31 ENCOUNTER — Telehealth: Payer: Self-pay | Admitting: *Deleted

## 2022-05-31 NOTE — Telephone Encounter (Signed)
   Telephone encounter was:  Unsuccessful.  05/31/2022 Name: Jose [name]. MRN: [id number] DOB: 11-02-1971  Unsuccessful outbound call made today to assist with:   disability   Outreach Attempt:  2nd Attempt  A HIPAA compliant voice message was left requesting a return call.  Instructed patient to call back at   Instructed patient to call back at [phone]  at their earliest convenience. . [place], Care Management  [phone] [address] , [place] [address] Email : [email or web address]

## 2022-06-01 ENCOUNTER — Telehealth: Payer: Self-pay | Admitting: *Deleted

## 2022-06-01 NOTE — Telephone Encounter (Signed)
.    Telephone encounter was:  Unsuccessful.  06/01/2022 Name: Khaalid Lefkowitz. MRN: [id number] DOB: [birth year]  Unsuccessful outbound call made today to assist with:   disability  Outreach Attempt:  3rd Attempt.  Referral closed unable to contact patient.  A HIPAA compliant voice message was left requesting a return call.  Instructed patient to call back at   Instructed patient to call back at [phone]  at their earliest convenience. .  [place], Care Management  [phone] [address] , [place] [address] Email : [email or web address]

## 2022-06-26 DIAGNOSIS — F10920 Alcohol use, unspecified with intoxication, uncomplicated: Secondary | ICD-10-CM | POA: Diagnosis not present

## 2022-06-26 DIAGNOSIS — I509 Heart failure, unspecified: Secondary | ICD-10-CM | POA: Diagnosis not present

## 2022-06-26 DIAGNOSIS — D500 Iron deficiency anemia secondary to blood loss (chronic): Secondary | ICD-10-CM | POA: Diagnosis not present

## 2022-06-26 DIAGNOSIS — I5032 Chronic diastolic (congestive) heart failure: Secondary | ICD-10-CM | POA: Diagnosis not present

## 2022-06-27 ENCOUNTER — Other Ambulatory Visit: Payer: Self-pay

## 2022-06-27 ENCOUNTER — Emergency Department: Payer: BC Managed Care – PPO

## 2022-06-27 ENCOUNTER — Emergency Department
Admission: EM | Admit: 2022-06-27 | Discharge: 2022-06-27 | Disposition: A | Discharge status: Home / Self Care | Payer: BC Managed Care – PPO | Attending: Emergency Medicine | Admitting: Emergency Medicine

## 2022-06-27 DIAGNOSIS — R4182 Altered mental status, unspecified: Secondary | ICD-10-CM | POA: Diagnosis not present

## 2022-06-27 DIAGNOSIS — F10120 Alcohol abuse with intoxication, uncomplicated: Secondary | ICD-10-CM | POA: Diagnosis not present

## 2022-06-27 DIAGNOSIS — F10920 Alcohol use, unspecified with intoxication, uncomplicated: Secondary | ICD-10-CM

## 2022-06-27 DIAGNOSIS — F10129 Alcohol abuse with intoxication, unspecified: Secondary | ICD-10-CM | POA: Diagnosis not present

## 2022-06-27 DIAGNOSIS — Y908 Blood alcohol level of 240 mg/100 ml or more: Secondary | ICD-10-CM | POA: Diagnosis not present

## 2022-06-27 DIAGNOSIS — R0602 Shortness of breath: Secondary | ICD-10-CM | POA: Diagnosis not present

## 2022-06-27 DIAGNOSIS — I509 Heart failure, unspecified: Secondary | ICD-10-CM | POA: Diagnosis not present

## 2022-06-27 DIAGNOSIS — J441 Chronic obstructive pulmonary disease with (acute) exacerbation: Secondary | ICD-10-CM | POA: Diagnosis not present

## 2022-06-27 DIAGNOSIS — I6782 Cerebral ischemia: Secondary | ICD-10-CM | POA: Diagnosis not present

## 2022-06-27 DIAGNOSIS — R0789 Other chest pain: Secondary | ICD-10-CM | POA: Diagnosis not present

## 2022-06-27 DIAGNOSIS — R001 Bradycardia, unspecified: Secondary | ICD-10-CM | POA: Diagnosis not present

## 2022-06-27 DIAGNOSIS — I959 Hypotension, unspecified: Secondary | ICD-10-CM | POA: Diagnosis not present

## 2022-06-27 DIAGNOSIS — J329 Chronic sinusitis, unspecified: Secondary | ICD-10-CM | POA: Diagnosis not present

## 2022-06-27 LAB — CBC WITH DIFFERENTIAL/PLATELET
Abs Immature Granulocytes: 0.02 10*3/uL (ref 0.00–0.07)
Basophils Absolute: 0.1 10*3/uL (ref 0.0–0.1)
Basophils Relative: 1 %
Eosinophils Absolute: 0.1 10*3/uL (ref 0.0–0.5)
Eosinophils Relative: 1 %
HCT: 35.4 % — ABNORMAL LOW (ref 39.0–52.0)
Hemoglobin: 11.7 g/dL — ABNORMAL LOW (ref 13.0–17.0)
Immature Granulocytes: 0 %
Lymphocytes Relative: 35 %
Lymphs Abs: 1.8 10*3/uL (ref 0.7–4.0)
MCH: 31 pg (ref 26.0–34.0)
MCHC: 33.1 g/dL (ref 30.0–36.0)
MCV: 93.7 fL (ref 80.0–100.0)
Monocytes Absolute: 0.4 10*3/uL (ref 0.1–1.0)
Monocytes Relative: 9 %
Neutro Abs: 2.8 10*3/uL (ref 1.7–7.7)
Neutrophils Relative %: 54 %
Platelets: 138 10*3/uL — ABNORMAL LOW (ref 150–400)
RBC: 3.78 MIL/uL — ABNORMAL LOW (ref 4.22–5.81)
RDW: 15 % (ref 11.5–15.5)
WBC: 5.2 10*3/uL (ref 4.0–10.5)
nRBC: 0 % (ref 0.0–0.2)

## 2022-06-27 LAB — COMPREHENSIVE METABOLIC PANEL
ALT: 21 U/L (ref 0–44)
AST: 44 U/L — ABNORMAL HIGH (ref 15–41)
Albumin: 3.7 g/dL (ref 3.5–5.0)
Alkaline Phosphatase: 92 U/L (ref 38–126)
Anion gap: 9 (ref 5–15)
BUN: 44 mg/dL — ABNORMAL HIGH (ref 6–20)
CO2: 21 mmol/L — ABNORMAL LOW (ref 22–32)
Calcium: 8.7 mg/dL — ABNORMAL LOW (ref 8.9–10.3)
Chloride: 110 mmol/L (ref 98–111)
Creatinine, Ser: 0.95 mg/dL (ref 0.61–1.24)
GFR, Estimated: >60 mL/min (ref >60)
Glucose, Bld: 104 mg/dL — ABNORMAL HIGH (ref 70–99)
Potassium: 3.9 mmol/L (ref 3.5–5.1)
Sodium: 140 mmol/L (ref 135–145)
Total Bilirubin: 0.3 mg/dL (ref 0.3–1.2)
Total Protein: 6.9 g/dL (ref 6.5–8.1)

## 2022-06-27 LAB — ETHANOL: Alcohol, Ethyl (B): 236 mg/dL — ABNORMAL HIGH (ref <10)

## 2022-06-27 LAB — BLOOD GAS, VENOUS
Acid-base deficit: 3 mmol/L — ABNORMAL HIGH (ref 0.0–2.0)
Bicarbonate: 22.7 mmol/L (ref 20.0–28.0)
O2 Saturation: 92.2 %
Patient temperature: 37
pCO2, Ven: 42 mmHg — ABNORMAL LOW (ref 44–60)
pH, Ven: 7.34 (ref 7.25–7.43)
pO2, Ven: 70 mmHg — ABNORMAL HIGH (ref 32–45)

## 2022-06-27 LAB — CARBAMAZEPINE LEVEL, TOTAL: Carbamazepine Lvl: 2.1 ug/mL — ABNORMAL LOW (ref 4.0–12.0)

## 2022-06-27 LAB — TROPONIN I (HIGH SENSITIVITY)
Troponin I (High Sensitivity): 21 ng/L — ABNORMAL HIGH (ref <18)
Troponin I (High Sensitivity): 17 ng/L (ref <18)

## 2022-06-27 LAB — BRAIN NATRIURETIC PEPTIDE: B Natriuretic Peptide: 51.4 pg/mL (ref 0.0–100.0)

## 2022-06-27 MED ORDER — PREDNISONE 50 MG PO TABS: 50.0000 mg | ORAL_TABLET | Freq: Every day | ORAL | 0 refills | Status: DC

## 2022-06-27 MED ORDER — PREDNISONE 20 MG PO TABS
60.0000 mg | ORAL_TABLET | Freq: Once | ORAL | Status: AC
  Administered 2022-06-27: 60 mg via ORAL
  Filled 2022-06-27: qty 3

## 2022-06-27 MED ORDER — SODIUM CHLORIDE 0.9 % IV BOLUS
500.0000 mL | Freq: Once | INTRAVENOUS | Status: AC
  Administered 2022-06-27: 500 mL via INTRAVENOUS

## 2022-06-27 MED ORDER — CARBAMAZEPINE 200 MG PO TABS
200.0000 mg | ORAL_TABLET | Freq: Once | ORAL | Status: AC
  Administered 2022-06-27: 200 mg via ORAL
  Filled 2022-06-27: qty 1

## 2022-06-27 MED ORDER — ALBUTEROL SULFATE HFA 108 (90 BASE) MCG/ACT IN AERS: 2.0000 | INHALATION_SPRAY | Freq: Four times a day (QID) | RESPIRATORY_TRACT | 2 refills | Status: DC | PRN

## 2022-06-27 MED ORDER — IPRATROPIUM-ALBUTEROL 0.5-2.5 (3) MG/3ML IN SOLN
3.0000 mL | RESPIRATORY_TRACT | Status: AC
  Administered 2022-06-27 (×3): 3 mL via RESPIRATORY_TRACT
  Filled 2022-06-27: qty 3
  Filled 2022-06-27: qty 6

## 2022-06-27 NOTE — Discharge Instructions (Signed)
Use inhaler and steroids as directed.  See your primary doctor for a follow-up visit this week.  Please remember to take your seizure medications.  Your seizure medication level was low today, please follow-up with your seizure doctor.

## 2022-06-27 NOTE — ED Triage Notes (Signed)
Pt reports "I can't breath" and chest "tightness" Denies pain.

## 2022-06-27 NOTE — ED Provider Notes (Addendum)
[facility] Provider Note    Event Date/Time   First MD Initiated Contact with Patient 06/27/22 [phone]     (approximate)   History   Shortness of Breath   HPI  Jose [name]. is a 51 y.o. male   Past medical history of COPD, CHF, seizures on Tegretol, anemia Presents with weakness and shortness of breath.  Had a mild cough for the last few days as well.  No fever.  He says that he is mostly compliant with all of his medications.  He has no chest pain or abdominal pain.  He denies other complaints at this time.  Unknown last seizure, he states may be several days ago but is unsure of fall or head strike.  History was obtained via the patient and a review of external notes.  Patient was also obtained by EMS, reports no hypoxia, and reports hypotension on scene but on arrival to the emergency department is now normotensive without intervention by EMS.      Physical Exam   Triage Vital Signs: ED Triage Vitals  Enc Vitals Group     BP 06/27/22 0823 104/74     Pulse Rate 06/27/22 0823 83     Resp 06/27/22 0823 14     Temp 06/27/22 0823 97.7 F (36.5 C)     Temp Source 06/27/22 0823 Oral     SpO2 06/27/22 0819 93 %     Weight --      Height --      Head Circumference --      Peak Flow --      Pain Score 06/27/22 0823 0     Pain Loc --      Pain Edu? --      Excl. in [place]? --     Most recent vital signs: Vitals:   06/27/22 1000 06/27/22 1015  BP:  (!) 140/86  Pulse: 92 97  Resp: 16 16  Temp:  97.9 F (36.6 C)  SpO2: 99% 95%    General: Awake, no distress.  Tired appearing, no respiratory distress. CV:  Good peripheral perfusion.  Regular rate and rhythm on auscultation without obvious murmur Resp:  Normal effort.,  Some wheezing right greater than left.  No tachypnea, no hypoxia on room air 93% with history of COPD. Abd:  No distention. Non tender Other:  Having all extremities, sensation intact, extraocular movements intact.  Pupils equal  and reactive bilaterally, no signs of head trauma.   ED Results / Procedures / Treatments   Labs (all labs ordered are listed, but only abnormal results are displayed) Labs Reviewed  COMPREHENSIVE METABOLIC PANEL - Abnormal; Notable for the following components:      Result Value   CO2 21 (*)    Glucose, Bld 104 (*)    BUN 44 (*)    Calcium 8.7 (*)    AST 44 (*)    All other components within normal limits  ETHANOL - Abnormal; Notable for the following components:   Alcohol, Ethyl (B) 236 (*)    All other components within normal limits  CBC WITH DIFFERENTIAL/PLATELET - Abnormal; Notable for the following components:   RBC 3.78 (*)    Hemoglobin 11.7 (*)    HCT 35.4 (*)    Platelets 138 (*)    All other components within normal limits  BLOOD GAS, VENOUS - Abnormal; Notable for the following components:   pCO2, Ven 42 (*)    pO2, Ven 70 (*)  Acid-base deficit 3.0 (*)    All other components within normal limits  CARBAMAZEPINE LEVEL, TOTAL - Abnormal; Notable for the following components:   Carbamazepine Lvl 2.1 (*)    All other components within normal limits  TROPONIN I (HIGH SENSITIVITY) - Abnormal; Notable for the following components:   Troponin I (High Sensitivity) 21 (*)    All other components within normal limits  BRAIN NATRIURETIC PEPTIDE  TROPONIN I (HIGH SENSITIVITY)     I reviewed labs and they are notable for normal proBNP  EKG  ED ECG REPORT I, Lucillie Garfinkel, the attending physician, personally viewed and interpreted this ECG.   Date: 06/27/2022  EKG Time: 0917  Rate: 90  Rhythm: 1st degree AV block. PVCs  Axis: Normal  Intervals:first-degree A-V block   ST&T Change: No ischemic changes    RADIOLOGY I dependently reviewed and interpreted CT head and see no overt bleed or shift   PROCEDURES:  Critical Care performed: No  Procedures   MEDICATIONS ORDERED IN ED: Medications  ipratropium-albuterol (DUONEB) 0.5-2.5 (3) MG/3ML nebulizer  solution 3 mL (3 mLs Nebulization Given 06/27/22 0949)  predniSONE (DELTASONE) tablet 60 mg (60 mg Oral Given 06/27/22 0922)  sodium chloride 0.9 % bolus 500 mL (0 mLs Intravenous Stopped 06/27/22 1016)  carbamazepine (TEGRETOL) tablet 200 mg (200 mg Oral Given 06/27/22 0933)     IMPRESSION / MDM / ASSESSMENT AND PLAN / ED COURSE  I reviewed the triage vital signs and the nursing notes.                              Differential diagnosis includes, but is not limited to, OPD exacerbation, CHF exacerbation, resp infection, seizure, intracranial bleed.   The patient is on the cardiac monitor to evaluate for evidence of arrhythmia and/or significant heart rate changes.  MDM: This patient with history of COPD and CHF presents with chief complaint of shortness of breath and weakness, concern for CHF or COPD exacerbation or respiratory infection.  Work-up including labs, chest x-ray, EKG and troponins, treat COPD exacerbation given wheezing on auscultation with DuoNebs and steroids.  No new oxygen requirement.  Consider seizure with seizure history and reported seizure couple days ago with unknown fall will CT head.  He is sleepy appearing with no focal neurological deficits I suspect this is a consequence of his underlying respiratory illness with complaints of shortness of breath and cough rather than underlying neurological pathology, will check blood gas and infections as above.  Troponin 21, mildly increased from baseline in the high teens.  We will continue to monitor, suspect in the setting of COPD exacerbation likely demand.  No ischemic changes on EKG.   Carbamazaaine level is low.  Will dose with oral and ED and emphasized to patient importance to take at home as prescribed.  ETOH 200+, likely accounting for his sleepiness.   Pt already feeling subjectively improved with nebs.  CT head neg.   I anticipate with sobriety, symptomatic improvement, and if troponins flat then he can be  treated for COPD exacerbation as outpt w outpt f/u.   11:25 AM Downtrending troponin And remains asymptomatic at this time, feeling well, would like to go home. Counseled on alcohol use and medication compliance. Patient is safe for discharge home after finding safe transport; elevated alcohol levels though clinically sober on my exam.  Dispo: After careful consideration of this patient's presentation, medical and social risk factors, and evaluation  in the emergency department I engaged in shared decision making with the patient and/or their representative to consider admission or observation and this patient was ultimately discharged because symptomatic improvement and patient preference.   Patient's presentation is most consistent with acute presentation with potential threat to life or bodily function.       FINAL CLINICAL IMPRESSION(S) / ED DIAGNOSES   Final diagnoses:  COPD exacerbation ([place])  Alcoholic intoxication without complication ([place])     Rx / DC Orders   ED Discharge Orders          Ordered    predniSONE (DELTASONE) 50 MG tablet  Daily        06/27/22 1054    albuterol (VENTOLIN HFA) 108 (90 Base) MCG/ACT inhaler  Every 6 hours PRN        06/27/22 1054             Note:  This document was prepared using Dragon voice recognition software and may include unintentional dictation errors.    Lucillie Garfinkel, MD 06/27/22 1123    Lucillie Garfinkel, MD 06/27/22 1125

## 2022-06-27 NOTE — ED Notes (Signed)
Pt awake and alert talking to nurse but will occasionally fall asleep. Pt responds appropriately to his name.

## 2022-06-28 ENCOUNTER — Ambulatory Visit: Payer: BC Managed Care – PPO | Admitting: Family

## 2022-06-29 ENCOUNTER — Ambulatory Visit: Payer: BC Managed Care – PPO | Attending: Family | Admitting: Family

## 2022-06-29 ENCOUNTER — Encounter: Payer: Self-pay | Admitting: Family

## 2022-06-29 VITALS — BP 130/82 | HR 84 | Resp 20 | Ht 70.0 in | Wt 200.2 lb

## 2022-06-29 DIAGNOSIS — J449 Chronic obstructive pulmonary disease, unspecified: Secondary | ICD-10-CM | POA: Insufficient documentation

## 2022-06-29 DIAGNOSIS — I10 Essential (primary) hypertension: Secondary | ICD-10-CM

## 2022-06-29 DIAGNOSIS — I110 Hypertensive heart disease with heart failure: Secondary | ICD-10-CM | POA: Insufficient documentation

## 2022-06-29 DIAGNOSIS — I5032 Chronic diastolic (congestive) heart failure: Secondary | ICD-10-CM

## 2022-06-29 DIAGNOSIS — F1090 Alcohol use, unspecified, uncomplicated: Secondary | ICD-10-CM | POA: Insufficient documentation

## 2022-06-29 DIAGNOSIS — F1010 Alcohol abuse, uncomplicated: Secondary | ICD-10-CM | POA: Diagnosis not present

## 2022-06-29 DIAGNOSIS — E785 Hyperlipidemia, unspecified: Secondary | ICD-10-CM | POA: Diagnosis not present

## 2022-06-29 DIAGNOSIS — F17290 Nicotine dependence, other tobacco product, uncomplicated: Secondary | ICD-10-CM | POA: Diagnosis not present

## 2022-06-29 NOTE — Patient Instructions (Addendum)
Continue weighing daily and call for an overnight weight gain of 3 pounds or more or a weekly weight gain of more than 5 pounds.   Eat a low-sodium diet.

## 2022-06-29 NOTE — Progress Notes (Signed)
Patient ID: Jose Dakins., male    DOB: 1971/05/07, 51 y.o.   MRN: [id number]  Jose [name] is a 51 y/o male with a history of hyperlipidemia, HTN, current tobacco/ alcohol use and chronic heart failure.   Echo report from 11/05/21 reviewed and showed an EF of 60-65% along with moderate LVH. Echo report from 01/29/2019 reviewed and showed an EF of 60-65%  Was in the ED 06/27/22 due to COPD exacerbation and alcohol intoxication where he was evaluated and released.   He presents today for a follow-up visit with a chief complaint of minimal SOB that happened Monday and was rushed to ED for a COPD exacerbation. He has associated cough and wheezing. He says he is smoking two cigars a day. He is not following a low-sodium diet and eats out mostly. He denies any dizziness, headaches, chest pain/pressure, difficulty sleeping, abdominal distention,constipation nor diarrhea.    Takes his morning medicine around 3am as that is when he wakes up in the mornings and then takes his 2nd dose of medication before he leaves work around 3pm. Then takes his 3rd dose of medication before going to sleep.   Past Medical History:  Diagnosis Date   Alcohol abuse    Cervicalgia    CHF (congestive heart failure) (HCC)    Hyperlipidemia    Hypertension    Seizures ([place])    Past Surgical History:  Procedure Laterality Date   COLONOSCOPY WITH PROPOFOL N/A 01/16/2018   Procedure: COLONOSCOPY WITH PROPOFOL;  Surgeon: Lin Landsman, MD;  Location[facility] ENDOSCOPY;  Service: Gastroenterology;  Laterality: N/A;   ESOPHAGOGASTRODUODENOSCOPY (EGD) WITH PROPOFOL N/A 01/16/2018   Procedure: ESOPHAGOGASTRODUODENOSCOPY (EGD) WITH PROPOFOL;  Surgeon: Lin Landsman, MD;  Location: [facility] ENDOSCOPY;  Service: Gastroenterology;  Laterality: N/A;   Family History  Problem Relation Age of Onset   Diabetes Mother    Colon cancer Mother    Hypertension Father    Multiple sclerosis Father    Social History   Tobacco Use    Smoking status: Every Day    Packs/day: 1.00    Years: 33.00    Total pack years: 33.00    Types: Cigars, Cigarettes    Start date: 77    Last attempt to quit: 2020    Years since quitting: 3.6   Smokeless tobacco: Former   Tobacco comments:    2 cigars a day-05/05/2022    Quit smoking cigarettes 3 years ago.  Smokes cigarettes since he was 51 years old.  Substance Use Topics   Alcohol use: Yes    Alcohol/week: 28.0 standard drinks of alcohol    Types: 7 Cans of beer, 21 Shots of liquor per week    Comment: half pint liquor per day   No Known Allergies  Prior to Admission medications   Medication Sig Start Date End Date Taking? Authorizing Provider  albuterol (VENTOLIN HFA) 108 (90 Base) MCG/ACT inhaler Inhale 2 puffs into the lungs every 6 (six) hours as needed for wheezing or shortness of breath. 04/15/22  Yes Jon Billings, NP  albuterol (VENTOLIN HFA) 108 (90 Base) MCG/ACT inhaler Inhale 2 puffs into the lungs every 6 (six) hours as needed for wheezing or shortness of breath. 06/27/22  Yes Lucillie Garfinkel, MD  amLODipine (NORVASC) 10 MG tablet Take 1 tablet (10 mg total) by mouth daily. 04/18/22  Yes Jon Billings, NP  carbamazepine (TEGRETOL) 200 MG tablet Take 1 tablet (200 mg total) by mouth 2 (two) times daily. 05/26/22  Yes Jon Billings, NP  carvedilol (COREG) 25 MG tablet Take 1 tablet (25 mg total) by mouth 2 (two) times daily with a meal. 04/18/22  Yes Jon Billings, NP  empagliflozin (JARDIANCE) 10 MG TABS tablet Take 1 tablet (10 mg total) by mouth daily before breakfast. 05/26/22  Yes Jon Billings, NP  Fluticasone-Umeclidin-Vilant (TRELEGY ELLIPTA) 100-62.5-25 MCG/ACT AEPB Inhale 1 puff into the lungs daily. 05/05/22  Yes Tyler Pita, MD  furosemide (LASIX) 40 MG tablet Take 1 tablet (40 mg total) by mouth daily. Increased from 20 mg daily. 05/26/22 11/22/22 Yes Jon Billings, NP  hydrALAZINE (APRESOLINE) 50 MG tablet Take 1 tablet (50 mg total) by  mouth 3 (three) times daily. 04/18/22  Yes Jon Billings, NP  lisinopril (ZESTRIL) 40 MG tablet Take 1 tablet (40 mg total) by mouth daily. 04/18/22 10/15/22 Yes Jon Billings, NP  Iron, Ferrous Sulfate, 325 (65 Fe) MG TABS Take 325 mg by mouth daily. Patient not taking: Reported on 06/29/2022 04/15/22   Jon Billings, NP  isosorbide mononitrate (IMDUR) 30 MG 24 hr tablet Take 1 tablet (30 mg total) by mouth daily. Patient not taking: Reported on 06/29/2022 05/26/22   Jon Billings, NP  potassium chloride (MICRO-K) 10 MEQ CR capsule Take 20 mEq by mouth daily. Patient not taking: Reported on 06/29/2022    [provider]  predniSONE (DELTASONE) 50 MG tablet Take 1 tablet (50 mg total) by mouth daily for 3 days. Patient not taking: Reported on 06/29/2022 06/28/22 07/01/22  Lucillie Garfinkel, MD  rosuvastatin (CRESTOR) 20 MG tablet Take 1 tablet (20 mg total) by mouth daily. Patient not taking: Reported on 06/29/2022 05/26/22   Jon Billings, NP    Review of Systems  Constitutional:  Negative for appetite change, fatigue and fever.  HENT:  Positive for hearing loss. Negative for congestion, rhinorrhea and sore throat.   Eyes: Negative.   Respiratory:  Positive for shortness of breath and wheezing. Negative for cough and chest tightness.   Cardiovascular:  Negative for chest pain, palpitations and leg swelling.  Gastrointestinal:  Negative for abdominal distention and abdominal pain.  Endocrine: Negative.   Genitourinary: Negative.   Musculoskeletal:  Positive for arthralgias (right knee). Negative for back pain.  Skin: Negative.   Allergic/Immunologic: Negative.   Neurological:  Negative for dizziness, tremors and light-headedness.  Hematological:  Negative for adenopathy. Does not bruise/bleed easily.  Psychiatric/Behavioral:  Negative for dysphoric mood and sleep disturbance (sleeping on 2 pillows). The patient is not nervous/anxious.     Today's Vitals   06/29/22 1533  BP:  130/82  Pulse: 84  Resp: 20  SpO2: 97%  Weight: 200 lb 4 oz (90.8 kg)  Height: '5\' 10"'$  (1.778 m)  PainSc: 0-No pain   Body mass index is 28.73 kg/m.  Filed Weights   06/29/22 1533  Weight: 200 lb 4 oz (90.8 kg)     Lab Results  Component Value Date   CREATININE 0.95 06/27/2022   CREATININE 0.69 (L) 05/26/2022   CREATININE 0.67 (L) 02/24/2022    Physical Exam Vitals and nursing note reviewed.  Constitutional:      Appearance: Normal appearance.  HENT:     Head: Normocephalic and atraumatic.     Right Ear: Decreased hearing noted.     Left Ear: Decreased hearing noted.  Neck:     Vascular: No JVD.  Cardiovascular:     Rate and Rhythm: Normal rate and regular rhythm.     Heart sounds: Normal heart sounds.  Pulmonary:  Effort: Pulmonary effort is normal.     Breath sounds: Wheezing present. No rhonchi or rales.  Abdominal:     General: There is no distension.     Palpations: Abdomen is soft.     Tenderness: There is no abdominal tenderness.  Musculoskeletal:        General: Swelling (right knee) present. No tenderness.     Cervical back: Normal range of motion and neck supple.     Right lower leg: No edema.     Left lower leg: No edema.  Skin:    General: Skin is warm and dry.  Neurological:     General: No focal deficit present.     Mental Status: He is alert and oriented to person, place, and time.     Motor: No tremor.  Psychiatric:        Mood and Affect: Mood normal.        Behavior: Behavior normal.     Assessment & Plan:  1. 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 (LVH)- - NYHA class II - euvolemic today - weighing daily; reminded to call for an overnight weight gain of > 2 pounds or a weekly weight gain of > 5 pounds - weight 200 lbs today down 11 pounds since last visit in April - discussed not adding salt and reading food labels for sodium content - on GDMT of jardiance - BNP 06/27/22 was 51.4  2:  HTN- - BP mildly elevated (130/82)  - saw PCP Jose [name]) 05/26/22 - BMP from 06/27/22 reviewed and showed sodium 140, potassium 3.9, creatinine 0.95 and GFR >60  3: COPD-  - says that he's still smoking but very little during the week but tends to "make up for it" on the   weekends - still smoking tobacco 2 cigars a day - complete cessation discussed for 2 minutes with him - saw pulmonology Jose [name]) 05/05/22  4: Chronic alcohol use- - drinks 1 pint of vodka daily in addition to beer daily - discussed decreasing his alcohol use in half for 1 month and then decrease it in another half with the goal being to stop  - cessation discussed with patient for 3 mins   Patient brought his medications. Each medication was verbally reviewed with the patient and he was encouraged to bring the bottles to every visit to confirm accuracy of list.   Return in 6 months, sooner if needed.

## 2022-06-30 ENCOUNTER — Ambulatory Visit: Payer: BC Managed Care – PPO | Admitting: Family

## 2022-06-30 ENCOUNTER — Encounter: Payer: Self-pay | Admitting: Nurse Practitioner

## 2022-06-30 ENCOUNTER — Ambulatory Visit (INDEPENDENT_AMBULATORY_CARE_PROVIDER_SITE_OTHER): Payer: BC Managed Care – PPO | Admitting: Nurse Practitioner

## 2022-06-30 VITALS — BP 102/64 | HR 74 | Temp 98.5°F | Wt 199.4 lb

## 2022-06-30 DIAGNOSIS — M5442 Lumbago with sciatica, left side: Secondary | ICD-10-CM

## 2022-06-30 DIAGNOSIS — M5441 Lumbago with sciatica, right side: Secondary | ICD-10-CM | POA: Diagnosis not present

## 2022-06-30 DIAGNOSIS — F1010 Alcohol abuse, uncomplicated: Secondary | ICD-10-CM | POA: Diagnosis not present

## 2022-06-30 MED ORDER — TRIAMCINOLONE ACETONIDE 40 MG/ML IJ SUSP
40.0000 mg | Freq: Once | INTRAMUSCULAR | Status: AC
  Administered 2022-06-30: 40 mg via INTRAMUSCULAR

## 2022-06-30 NOTE — Assessment & Plan Note (Signed)
Discussed at length with patient that decreasing alcohol use is important for his overall health.  Patient states he plans to decrease.  Will repeat CBC and CMP today. Follow up in 2 months.

## 2022-06-30 NOTE — Progress Notes (Signed)
BP 102/64   Pulse 74   Temp 98.5 F (36.9 C) (Oral)   Wt 199 lb 6.4 oz (90.4 kg)   SpO2 96%   BMI 28.61 kg/m    Subjective:    Patient ID: Jose [name]., male    DOB: [birth year], 51 y.o.   MRN: [id number]  HPI: Jose [name]. is a 51 y.o. male  Chief Complaint  Patient presents with   Hospitalization Follow-up    F/up since hospital visit. Patient reports feeling better. Denies SOB    HYPERTENSION / HYPERLIPIDEMIA/HF Satisfied with current treatment? yes Duration of hypertension: years BP monitoring frequency: not checking BP range:  BP medication side effects: no Past BP meds:  Lasux, amlodipine, carvedilol, and lisinopril Duration of hyperlipidemia: years Cholesterol medication side effects: no Cholesterol supplements: none Past cholesterol medications: rosuvastatin (crestor) Medication compliance: excellent compliance Aspirin: no Recent stressors: no Recurrent headaches: no Visual changes: no Palpitations: no Dyspnea: yes Chest pain: no Lower extremity edema: no Dizzy/lightheaded: no  ALCOHOL USE Drinking 1 pint of liquor daily and more on the weekends.     Patient has an appt with Neurology in October.    Patient states he has been having back pain.  He was pushing large boxes at work and feels like the midline of his back has been hurting since.  He has pain down into his buttocks.  Relevant past medical, surgical, family and social history reviewed and updated as indicated. Interim medical history since our last visit reviewed. Allergies and medications reviewed and updated.  Review of Systems  Eyes:  Negative for visual disturbance.  Respiratory:  Positive for shortness of breath. Negative for chest tightness.   Cardiovascular:  Negative for chest pain, palpitations and leg swelling.  Musculoskeletal:  Positive for back pain.  Neurological:  Negative for dizziness, light-headedness and headaches.    Per HPI unless specifically indicated above      Objective:    BP 102/64   Pulse 74   Temp 98.5 F (36.9 C) (Oral)   Wt 199 lb 6.4 oz (90.4 kg)   SpO2 96%   BMI 28.61 kg/m   Wt Readings from Last 3 Encounters:  06/30/22 199 lb 6.4 oz (90.4 kg)  06/29/22 200 lb 4 oz (90.8 kg)  06/27/22 194 lb (88 kg)    Physical Exam Vitals and nursing note reviewed.  Constitutional:      General: He is not in acute distress.    Appearance: Normal appearance. He is not ill-appearing, toxic-appearing or diaphoretic.  HENT:     Head: Normocephalic.     Right Ear: External ear normal.     Left Ear: External ear normal.     Nose: Nose normal. No congestion or rhinorrhea.     Mouth/Throat:     Mouth: Mucous membranes are moist.  Eyes:     General:        Right eye: No discharge.        Left eye: No discharge.     Extraocular Movements: Extraocular movements intact.     Conjunctiva/sclera: Conjunctivae normal.     Pupils: Pupils are equal, round, and reactive to light.  Cardiovascular:     Rate and Rhythm: Normal rate and regular rhythm.     Heart sounds: No murmur heard. Pulmonary:     Effort: Pulmonary effort is normal. No respiratory distress.     Breath sounds: Normal breath sounds. No wheezing, rhonchi or rales.  Abdominal:  General: Abdomen is flat. Bowel sounds are normal.  Musculoskeletal:     Cervical back: Normal range of motion and neck supple.  Skin:    General: Skin is warm and dry.     Capillary Refill: Capillary refill takes less than 2 seconds.  Neurological:     General: No focal deficit present.     Mental Status: He is alert and oriented to person, place, and time.  Psychiatric:        Mood and Affect: Mood normal.        Behavior: Behavior normal.        Thought Content: Thought content normal.        Judgment: Judgment normal.     Results for orders placed or performed during the hospital encounter of 06/27/22  Comprehensive metabolic panel  Result Value Ref Range   Sodium 140 135 - 145 mmol/L    Potassium 3.9 3.5 - 5.1 mmol/L   Chloride 110 98 - 111 mmol/L   CO2 21 (L) 22 - 32 mmol/L   Glucose, Bld 104 (H) 70 - 99 mg/dL   BUN 44 (H) 6 - 20 mg/dL   Creatinine, Ser 0.95 0.61 - 1.24 mg/dL   Calcium 8.7 (L) 8.9 - 10.3 mg/dL   Total Protein 6.9 6.5 - 8.1 g/dL   Albumin 3.7 3.5 - 5.0 g/dL   AST 44 (H) 15 - 41 U/L   ALT 21 0 - 44 U/L   Alkaline Phosphatase 92 38 - 126 U/L   Total Bilirubin 0.3 0.3 - 1.2 mg/dL   GFR, Estimated >60 >60 mL/min   Anion gap 9 5 - 15  Brain natriuretic peptide  Result Value Ref Range   B Natriuretic Peptide 51.4 0.0 - 100.0 pg/mL  Ethanol  Result Value Ref Range   Alcohol, Ethyl (B) 236 (H) <10 mg/dL  CBC with Differential  Result Value Ref Range   WBC 5.2 4.0 - 10.5 K/uL   RBC 3.78 (L) 4.22 - 5.81 MIL/uL   Hemoglobin 11.7 (L) 13.0 - 17.0 g/dL   HCT 35.4 (L) 39.0 - 52.0 %   MCV 93.7 80.0 - 100.0 fL   MCH 31.0 26.0 - 34.0 pg   MCHC 33.1 30.0 - 36.0 g/dL   RDW 15.0 11.5 - 15.5 %   Platelets 138 (L) 150 - 400 K/uL   nRBC 0.0 0.0 - 0.2 %   Neutrophils Relative % 54 %   Neutro Abs 2.8 1.7 - 7.7 K/uL   Lymphocytes Relative 35 %   Lymphs Abs 1.8 0.7 - 4.0 K/uL   Monocytes Relative 9 %   Monocytes Absolute 0.4 0.1 - 1.0 K/uL   Eosinophils Relative 1 %   Eosinophils Absolute 0.1 0.0 - 0.5 K/uL   Basophils Relative 1 %   Basophils Absolute 0.1 0.0 - 0.1 K/uL   Immature Granulocytes 0 %   Abs Immature Granulocytes 0.02 0.00 - 0.07 K/uL  Blood gas, venous  Result Value Ref Range   pH, Ven 7.34 7.25 - 7.43   pCO2, Ven 42 (L) 44 - 60 mmHg   pO2, Ven 70 (H) 32 - 45 mmHg   Bicarbonate 22.7 20.0 - 28.0 mmol/L   Acid-base deficit 3.0 (H) 0.0 - 2.0 mmol/L   O2 Saturation 92.2 %   Patient temperature 37.0   Carbamazepine level, total  Result Value Ref Range   Carbamazepine Lvl 2.1 (L) 4.0 - 12.0 ug/mL  Troponin I (High Sensitivity)  Result Value Ref Range  Troponin I (High Sensitivity) 21 (H) <18 ng/L  Troponin I (High Sensitivity)  Result Value  Ref Range   Troponin I (High Sensitivity) 17 <18 ng/L      Assessment & Plan:   Problem List Items Addressed This Visit       Other   Alcohol abuse - Primary    Discussed at length with patient that decreasing alcohol use is important for his overall health.  Patient states he plans to decrease.  Will repeat CBC and CMP today. Follow up in 2 months.      Relevant Orders   Comp Met (CMET)   CBC w/Diff   Lumbago    Triamcinolone given in office today.  If symptoms not improved can do imaging for further evaluation.  Follow up if symptoms do not improve.        Follow up plan: No follow-ups on file.

## 2022-06-30 NOTE — Assessment & Plan Note (Signed)
Triamcinolone given in office today.  If symptoms not improved can do imaging for further evaluation.  Follow up if symptoms do not improve.

## 2022-06-30 NOTE — Patient Instructions (Signed)
[address], [place], [place] [address]

## 2022-07-01 LAB — CBC WITH DIFFERENTIAL/PLATELET
Basophils Absolute: 0.1 10*3/uL (ref 0.0–0.2)
Basos: 1 %
EOS (ABSOLUTE): 0.1 10*3/uL (ref 0.0–0.4)
Eos: 2 %
Hematocrit: 33.1 % — ABNORMAL LOW (ref 37.5–51.0)
Hemoglobin: 11.6 g/dL — ABNORMAL LOW (ref 13.0–17.7)
Immature Grans (Abs): 0 10*3/uL (ref 0.0–0.1)
Immature Granulocytes: 1 %
Lymphocytes Absolute: 1.8 10*3/uL (ref 0.7–3.1)
Lymphs: 33 %
MCH: 30.8 pg (ref 26.6–33.0)
MCHC: 35 g/dL (ref 31.5–35.7)
MCV: 88 fL (ref 79–97)
Monocytes Absolute: 0.9 10*3/uL (ref 0.1–0.9)
Monocytes: 17 %
Neutrophils Absolute: 2.6 10*3/uL (ref 1.4–7.0)
Neutrophils: 46 %
Platelets: 139 10*3/uL — ABNORMAL LOW (ref 150–450)
RBC: 3.77 x10E6/uL — ABNORMAL LOW (ref 4.14–5.80)
RDW: 13.6 % (ref 11.6–15.4)
WBC: 5.6 10*3/uL (ref 3.4–10.8)

## 2022-07-01 LAB — COMPREHENSIVE METABOLIC PANEL
ALT: 53 IU/L — ABNORMAL HIGH (ref 0–44)
AST: 90 IU/L — ABNORMAL HIGH (ref 0–40)
Albumin/Globulin Ratio: 1.7 (ref 1.2–2.2)
Albumin: 4.4 g/dL (ref 3.8–4.9)
Alkaline Phosphatase: 84 IU/L (ref 44–121)
BUN/Creatinine Ratio: 29 — ABNORMAL HIGH (ref 9–20)
BUN: 20 mg/dL (ref 6–24)
Bilirubin Total: 0.5 mg/dL (ref 0.0–1.2)
CO2: 24 mmol/L (ref 20–29)
Calcium: 9.1 mg/dL (ref 8.7–10.2)
Chloride: 96 mmol/L (ref 96–106)
Creatinine, Ser: 0.68 mg/dL — ABNORMAL LOW (ref 0.76–1.27)
Globulin, Total: 2.6 g/dL (ref 1.5–4.5)
Glucose: 92 mg/dL (ref 70–99)
Potassium: 3.7 mmol/L (ref 3.5–5.2)
Sodium: 138 mmol/L (ref 134–144)
Total Protein: 7 g/dL (ref 6.0–8.5)
eGFR: 112 mL/min/{1.73_m2} (ref >59)

## 2022-07-01 NOTE — Progress Notes (Signed)
Please let patient know that his lab work shows that his liver enzymes are elevated from prior.  I recommend he reduce the amount of alcohol he is drinking as discussed during the visit.  His anemia is stable.  Follow up as discussed.

## 2022-07-20 ENCOUNTER — Encounter: Payer: Self-pay | Admitting: Nurse Practitioner

## 2022-07-27 DIAGNOSIS — I5032 Chronic diastolic (congestive) heart failure: Secondary | ICD-10-CM | POA: Diagnosis not present

## 2022-07-27 DIAGNOSIS — D500 Iron deficiency anemia secondary to blood loss (chronic): Secondary | ICD-10-CM | POA: Diagnosis not present

## 2022-07-27 DIAGNOSIS — I509 Heart failure, unspecified: Secondary | ICD-10-CM | POA: Diagnosis not present

## 2022-07-27 DIAGNOSIS — F10920 Alcohol use, unspecified with intoxication, uncomplicated: Secondary | ICD-10-CM | POA: Diagnosis not present

## 2022-08-22 ENCOUNTER — Telehealth: Payer: Self-pay | Admitting: Nurse Practitioner

## 2022-08-22 NOTE — Progress Notes (Deleted)
There were no vitals taken for this visit.   Subjective:    Patient ID: Jose [name]., male    DOB: 07-Jan-1971, 51 y.o.   MRN: [id number]  HPI: Jose [name]. is a 51 y.o. male  No chief complaint on file.  HYPERTENSION / HYPERLIPIDEMIA/HF Satisfied with current treatment? yes Duration of hypertension: years BP monitoring frequency: not checking BP range:  BP medication side effects: no Past BP meds:  Lasux, amlodipine, carvedilol, and lisinopril Duration of hyperlipidemia: years Cholesterol medication side effects: no Cholesterol supplements: none Past cholesterol medications: rosuvastatin (crestor) Medication compliance: excellent compliance Aspirin: no Recent stressors: no Recurrent headaches: no Visual changes: no Palpitations: no Dyspnea: yes Chest pain: no Lower extremity edema: no Dizzy/lightheaded: no  ALCOHOL USE Drinking 1 pint of liquor daily and more on the weekends.     Patient has an appt with Neurology in October.    Patient states he has been having back pain.  He was pushing large boxes at work and feels like the midline of his back has been hurting since.  He has pain down into his buttocks.  Relevant past medical, surgical, family and social history reviewed and updated as indicated. Interim medical history since our last visit reviewed. Allergies and medications reviewed and updated.  Review of Systems  Eyes:  Negative for visual disturbance.  Respiratory:  Positive for shortness of breath. Negative for chest tightness.   Cardiovascular:  Negative for chest pain, palpitations and leg swelling.  Musculoskeletal:  Positive for back pain.  Neurological:  Negative for dizziness, light-headedness and headaches.    Per HPI unless specifically indicated above     Objective:    There were no vitals taken for this visit.  Wt Readings from Last 3 Encounters:  06/30/22 199 lb 6.4 oz (90.4 kg)  06/29/22 200 lb 4 oz (90.8 kg)  06/27/22 194 lb (88  kg)    Physical Exam Vitals and nursing note reviewed.  Constitutional:      General: He is not in acute distress.    Appearance: Normal appearance. He is not ill-appearing, toxic-appearing or diaphoretic.  HENT:     Head: Normocephalic.     Right Ear: External ear normal.     Left Ear: External ear normal.     Nose: Nose normal. No congestion or rhinorrhea.     Mouth/Throat:     Mouth: Mucous membranes are moist.  Eyes:     General:        Right eye: No discharge.        Left eye: No discharge.     Extraocular Movements: Extraocular movements intact.     Conjunctiva/sclera: Conjunctivae normal.     Pupils: Pupils are equal, round, and reactive to light.  Cardiovascular:     Rate and Rhythm: Normal rate and regular rhythm.     Heart sounds: No murmur heard. Pulmonary:     Effort: Pulmonary effort is normal. No respiratory distress.     Breath sounds: Normal breath sounds. No wheezing, rhonchi or rales.  Abdominal:     General: Abdomen is flat. Bowel sounds are normal.  Musculoskeletal:     Cervical back: Normal range of motion and neck supple.  Skin:    General: Skin is warm and dry.     Capillary Refill: Capillary refill takes less than 2 seconds.  Neurological:     General: No focal deficit present.     Mental Status: He is alert and oriented  to person, place, and time.  Psychiatric:        Mood and Affect: Mood normal.        Behavior: Behavior normal.        Thought Content: Thought content normal.        Judgment: Judgment normal.    Results for orders placed or performed in visit on 06/30/22  Comp Met (CMET)  Result Value Ref Range   Glucose 92 70 - 99 mg/dL   BUN 20 6 - 24 mg/dL   Creatinine, Ser 0.68 (L) 0.76 - 1.27 mg/dL   eGFR 112 >59 mL/min/1.73   BUN/Creatinine Ratio 29 (H) 9 - 20   Sodium 138 134 - 144 mmol/L   Potassium 3.7 3.5 - 5.2 mmol/L   Chloride 96 96 - 106 mmol/L   CO2 24 20 - 29 mmol/L   Calcium 9.1 8.7 - 10.2 mg/dL   Total Protein 7.0  6.0 - 8.5 g/dL   Albumin 4.4 3.8 - 4.9 g/dL   Globulin, Total 2.6 1.5 - 4.5 g/dL   Albumin/Globulin Ratio 1.7 1.2 - 2.2   Bilirubin Total 0.5 0.0 - 1.2 mg/dL   Alkaline Phosphatase 84 44 - 121 IU/L   AST 90 (H) 0 - 40 IU/L   ALT 53 (H) 0 - 44 IU/L  CBC w/Diff  Result Value Ref Range   WBC 5.6 3.4 - 10.8 x10E3/uL   RBC 3.77 (L) 4.14 - 5.80 x10E6/uL   Hemoglobin 11.6 (L) 13.0 - 17.7 g/dL   Hematocrit 33.1 (L) 37.5 - 51.0 %   MCV 88 79 - 97 fL   MCH 30.8 26.6 - 33.0 pg   MCHC 35.0 31.5 - 35.7 g/dL   RDW 13.6 11.6 - 15.4 %   Platelets 139 (L) 150 - 450 x10E3/uL   Neutrophils 46 Not Estab. %   Lymphs 33 Not Estab. %   Monocytes 17 Not Estab. %   Eos 2 Not Estab. %   Basos 1 Not Estab. %   Neutrophils Absolute 2.6 1.4 - 7.0 x10E3/uL   Lymphocytes Absolute 1.8 0.7 - 3.1 x10E3/uL   Monocytes Absolute 0.9 0.1 - 0.9 x10E3/uL   EOS (ABSOLUTE) 0.1 0.0 - 0.4 x10E3/uL   Basophils Absolute 0.1 0.0 - 0.2 x10E3/uL   Immature Granulocytes 1 Not Estab. %   Immature Grans (Abs) 0.0 0.0 - 0.1 x10E3/uL      Assessment & Plan:   Problem List Items Addressed This Visit   None    Follow up plan: No follow-ups on file.

## 2022-08-22 NOTE — Telephone Encounter (Signed)
Copied from [place] [id number]. Topic: General - Inquiry >> Aug 22, 2022  1:29 PM Leilani Able wrote: Reason for CRM: Pt had called in wanting all his meds filled today. I ask him to tell me which ones and he said all of them again and they all were not due, he said it was all because he can not pronounce them, told whim he he could spell the first several letters and then he said the writing was too small to read. He says he just knows he does not get all his meds as he cannot read the directions, he just takes some of all of them. I told him to speak to the dr at his appt as he says he has no idea what he is taking and for what. I told him there are options to see the scripts better or pill packets... Do not know if he will share at appt tomorrow and told him it was very important to share with his dr.

## 2022-08-22 NOTE — Telephone Encounter (Signed)
I will speak with patient at his visit tomorrow.

## 2022-08-23 ENCOUNTER — Ambulatory Visit: Payer: BC Managed Care – PPO | Admitting: Nurse Practitioner

## 2022-08-23 DIAGNOSIS — E7800 Pure hypercholesterolemia, unspecified: Secondary | ICD-10-CM

## 2022-08-23 DIAGNOSIS — R569 Unspecified convulsions: Secondary | ICD-10-CM

## 2022-08-23 DIAGNOSIS — J449 Chronic obstructive pulmonary disease, unspecified: Secondary | ICD-10-CM

## 2022-08-23 DIAGNOSIS — I10 Essential (primary) hypertension: Secondary | ICD-10-CM

## 2022-08-23 DIAGNOSIS — F1010 Alcohol abuse, uncomplicated: Secondary | ICD-10-CM

## 2022-08-23 DIAGNOSIS — Z1159 Encounter for screening for other viral diseases: Secondary | ICD-10-CM

## 2022-08-23 DIAGNOSIS — D500 Iron deficiency anemia secondary to blood loss (chronic): Secondary | ICD-10-CM

## 2022-08-23 DIAGNOSIS — G40909 Epilepsy, unspecified, not intractable, without status epilepticus: Secondary | ICD-10-CM

## 2022-08-23 DIAGNOSIS — I5032 Chronic diastolic (congestive) heart failure: Secondary | ICD-10-CM

## 2022-08-24 ENCOUNTER — Ambulatory Visit: Payer: BC Managed Care – PPO | Admitting: Nurse Practitioner

## 2022-08-24 ENCOUNTER — Encounter: Payer: Self-pay | Admitting: Nurse Practitioner

## 2022-08-24 VITALS — BP 107/65 | HR 68 | Temp 98.7°F | Wt 207.1 lb

## 2022-08-24 DIAGNOSIS — I10 Essential (primary) hypertension: Secondary | ICD-10-CM | POA: Diagnosis not present

## 2022-08-24 DIAGNOSIS — Z1159 Encounter for screening for other viral diseases: Secondary | ICD-10-CM | POA: Diagnosis not present

## 2022-08-24 DIAGNOSIS — R7303 Prediabetes: Secondary | ICD-10-CM

## 2022-08-24 DIAGNOSIS — G40909 Epilepsy, unspecified, not intractable, without status epilepticus: Secondary | ICD-10-CM

## 2022-08-24 DIAGNOSIS — Z23 Encounter for immunization: Secondary | ICD-10-CM

## 2022-08-24 DIAGNOSIS — E7800 Pure hypercholesterolemia, unspecified: Secondary | ICD-10-CM

## 2022-08-24 DIAGNOSIS — I5032 Chronic diastolic (congestive) heart failure: Secondary | ICD-10-CM | POA: Diagnosis not present

## 2022-08-24 DIAGNOSIS — R569 Unspecified convulsions: Secondary | ICD-10-CM

## 2022-08-24 DIAGNOSIS — J449 Chronic obstructive pulmonary disease, unspecified: Secondary | ICD-10-CM

## 2022-08-24 DIAGNOSIS — F1010 Alcohol abuse, uncomplicated: Secondary | ICD-10-CM

## 2022-08-24 DIAGNOSIS — D500 Iron deficiency anemia secondary to blood loss (chronic): Secondary | ICD-10-CM

## 2022-08-24 MED ORDER — ISOSORBIDE MONONITRATE ER 30 MG PO TB24: 30.0000 mg | ORAL_TABLET | Freq: Every day | ORAL | 1 refills | Status: DC

## 2022-08-24 MED ORDER — CARBAMAZEPINE 200 MG PO TABS
200.0000 mg | ORAL_TABLET | Freq: Two times a day (BID) | ORAL | 1 refills | Status: DC
Start: 2022-08-24 — End: 2023-04-25

## 2022-08-24 MED ORDER — IRON (FERROUS SULFATE) 325 (65 FE) MG PO TABS: 325.0000 mg | ORAL_TABLET | Freq: Every day | ORAL | 1 refills | Status: DC

## 2022-08-24 MED ORDER — ROSUVASTATIN CALCIUM 20 MG PO TABS: 20.0000 mg | ORAL_TABLET | Freq: Every day | ORAL | 1 refills | Status: DC

## 2022-08-24 MED ORDER — TRELEGY ELLIPTA 100-62.5-25 MCG/ACT IN AEPB: 1.0000 | INHALATION_SPRAY | Freq: Every day | RESPIRATORY_TRACT | 0 refills | Status: DC

## 2022-08-24 NOTE — Progress Notes (Unsigned)
BP 107/65   Pulse 68   Temp 98.7 F (37.1 C) (Oral)   Wt 207 lb 1.6 oz (93.9 kg)   SpO2 96%   BMI 29.72 kg/m    Subjective:    Patient ID: Jose Dakins., male    DOB: [birth year], 51 y.o.   MRN: [id number]  HPI: Jose [name]. is a 51 y.o. male  Chief Complaint  Patient presents with   Follow-up   HYPERTENSION / HYPERLIPIDEMIA/HF Satisfied with current treatment? yes Duration of hypertension: years BP monitoring frequency: not checking BP range:  BP medication side effects: no Past BP meds:  Lasux, amlodipine, carvedilol, and lisinopril Duration of hyperlipidemia: years Cholesterol medication side effects: no Cholesterol supplements: none Past cholesterol medications: rosuvastatin (crestor) Medication compliance: excellent compliance Aspirin: no Recent stressors: no Recurrent headaches: no Visual changes: no Palpitations: no Dyspnea: yes Chest pain: no Lower extremity edema: no Dizzy/lightheaded: no  ALCOHOL USE Drinking about a little less than 1 pint daily.    Missed his appt with Neurology.    Relevant past medical, surgical, family and social history reviewed and updated as indicated. Interim medical history since our last visit reviewed. Allergies and medications reviewed and updated.  Review of Systems  Eyes:  Negative for visual disturbance.  Respiratory:  Positive for shortness of breath. Negative for chest tightness.   Cardiovascular:  Negative for chest pain, palpitations and leg swelling.  Neurological:  Negative for dizziness, light-headedness and headaches.    Per HPI unless specifically indicated above     Objective:    BP 107/65   Pulse 68   Temp 98.7 F (37.1 C) (Oral)   Wt 207 lb 1.6 oz (93.9 kg)   SpO2 96%   BMI 29.72 kg/m   Wt Readings from Last 3 Encounters:  08/24/22 207 lb 1.6 oz (93.9 kg)  06/30/22 199 lb 6.4 oz (90.4 kg)  06/29/22 200 lb 4 oz (90.8 kg)    Physical Exam Vitals and nursing note reviewed.   Constitutional:      General: He is not in acute distress.    Appearance: Normal appearance. He is not ill-appearing, toxic-appearing or diaphoretic.  HENT:     Head: Normocephalic.     Right Ear: External ear normal.     Left Ear: External ear normal.     Nose: Nose normal. No congestion or rhinorrhea.     Mouth/Throat:     Mouth: Mucous membranes are moist.  Eyes:     General:        Right eye: No discharge.        Left eye: No discharge.     Extraocular Movements: Extraocular movements intact.     Conjunctiva/sclera: Conjunctivae normal.     Pupils: Pupils are equal, round, and reactive to light.  Cardiovascular:     Rate and Rhythm: Normal rate and regular rhythm.     Heart sounds: No murmur heard. Pulmonary:     Effort: Pulmonary effort is normal. No respiratory distress.     Breath sounds: Normal breath sounds. No wheezing, rhonchi or rales.  Abdominal:     General: Abdomen is flat. Bowel sounds are normal.  Musculoskeletal:     Cervical back: Normal range of motion and neck supple.  Skin:    General: Skin is warm and dry.     Capillary Refill: Capillary refill takes less than 2 seconds.  Neurological:     General: No focal deficit present.  Mental Status: He is alert and oriented to person, place, and time.  Psychiatric:        Mood and Affect: Mood normal.        Behavior: Behavior normal.        Thought Content: Thought content normal.        Judgment: Judgment normal.     Results for orders placed or performed in visit on 08/24/22  Comp Met (CMET)  Result Value Ref Range   Glucose 96 70 - 99 mg/dL   BUN 23 6 - 24 mg/dL   Creatinine, Ser 0.87 0.76 - 1.27 mg/dL   eGFR 104 >59 mL/min/1.73   BUN/Creatinine Ratio 26 (H) 9 - 20   Sodium 138 134 - 144 mmol/L   Potassium 4.4 3.5 - 5.2 mmol/L   Chloride 103 96 - 106 mmol/L   CO2 21 20 - 29 mmol/L   Calcium 9.0 8.7 - 10.2 mg/dL   Total Protein 6.6 6.0 - 8.5 g/dL   Albumin 4.1 3.8 - 4.9 g/dL   Globulin,  Total 2.5 1.5 - 4.5 g/dL   Albumin/Globulin Ratio 1.6 1.2 - 2.2   Bilirubin Total 0.5 0.0 - 1.2 mg/dL   Alkaline Phosphatase 76 44 - 121 IU/L   AST 49 (H) 0 - 40 IU/L   ALT 38 0 - 44 IU/L  CBC w/Diff  Result Value Ref Range   WBC 5.3 3.4 - 10.8 x10E3/uL   RBC 3.66 (L) 4.14 - 5.80 x10E6/uL   Hemoglobin 11.6 (L) 13.0 - 17.7 g/dL   Hematocrit 34.8 (L) 37.5 - 51.0 %   MCV 95 79 - 97 fL   MCH 31.7 26.6 - 33.0 pg   MCHC 33.3 31.5 - 35.7 g/dL   RDW 14.5 11.6 - 15.4 %   Platelets 151 150 - 450 x10E3/uL   Neutrophils 53 Not Estab. %   Lymphs 28 Not Estab. %   Monocytes 14 Not Estab. %   Eos 2 Not Estab. %   Basos 2 Not Estab. %   Neutrophils Absolute 2.9 1.4 - 7.0 x10E3/uL   Lymphocytes Absolute 1.5 0.7 - 3.1 x10E3/uL   Monocytes Absolute 0.8 0.1 - 0.9 x10E3/uL   EOS (ABSOLUTE) 0.1 0.0 - 0.4 x10E3/uL   Basophils Absolute 0.1 0.0 - 0.2 x10E3/uL   Immature Granulocytes 1 Not Estab. %   Immature Grans (Abs) 0.0 0.0 - 0.1 x10E3/uL   NRBC 1 (H) 0 - 0 %  Hepatitis C Antibody  Result Value Ref Range   Hep C Virus Ab Non Reactive Non Reactive  HgB A1c  Result Value Ref Range   Hgb A1c MFr Bld 5.2 4.8 - 5.6 %   Est. average glucose Bld gHb Est-mCnc 103 mg/dL  Carbamazepine Level (Tegretol), total  Result Value Ref Range   Carbamazepine (Tegretol), S WILL FOLLOW       Assessment & Plan:   Problem List Items Addressed This Visit       Cardiovascular and Mediastinum   Hypertension - Primary    Chronic.  Controlled.  Continue with current medication regimen.  Continue to collaborate with Cardiology.  Labs ordered today.  Return to clinic in 3 months for reevaluation.  Call sooner if concerns arise.        Relevant Medications   isosorbide mononitrate (IMDUR) 30 MG 24 hr tablet   rosuvastatin (CRESTOR) 20 MG tablet   Other Relevant Orders   Comp Met (CMET) (Completed)   Chronic diastolic congestive heart failure ([place])  Chronic.  Controlled. Patient followed up with Heart  Failure.  Patient does not know his medications.  He is not currently taking Imdur.  Continue with follow up with HF clinic.  Labs ordered today.  Appears Euvolemic.  Follow up in 3 months. Call sooner if concerns arise.  Working to get patient pill packs to help with compliance of medications.  - Reminded to call for an overnight weight gain of >2 pounds or a weekly weight gain of >5 pounds - not adding salt to food and read food labels. Reviewed the importance of keeping daily sodium intake to <2081m daily. - Avoid Ibuprofen products.       Relevant Medications   isosorbide mononitrate (IMDUR) 30 MG 24 hr tablet   rosuvastatin (CRESTOR) 20 MG tablet     Respiratory   COPD (chronic obstructive pulmonary disease) (HCC)    Chronic. Saw Pulmonology in June.  Supposed to have a follow up in October.  Endorses taking Trellegy however, I question compliance due to him likely needing a refill since it was prescribed.  Discussed smoking cessation with patient during visit. Follow up in 3 months for reevaluation.      Relevant Medications   Fluticasone-Umeclidin-Vilant (TRELEGY ELLIPTA) 100-62.5-25 MCG/ACT AEPB     Nervous and Auditory   Epilepsy (HCC)    Chronic. Has not been taking Tegretol.  Missed his appt with Neurology.  Encouraged patient to call and make another appt. Refilled tegretol.  Levels ordered today due to question of compliance.      Relevant Medications   carbamazepine (TEGRETOL) 200 MG tablet   Other Relevant Orders   Carbamazepine Level (Tegretol), total (Completed)     Other   Seizures (HCC)    Chronic. Has not been taking Tegretol.  Missed his appt with Neurology.  Encouraged patient to call and make another appt. Refilled tegretol.  Levels ordered today due to question of compliance.      Relevant Medications   carbamazepine (TEGRETOL) 200 MG tablet   Other Relevant Orders   Carbamazepine Level (Tegretol), total (Completed)   Alcohol abuse    Chronic. Trying  to drink less than 1 pint daily.  This is down from 1 whole pint.  Praised for his effort to decrease alcohol consumption.      Hypercholesterolemia    Chronic. Not currently taking Crestor.  Refill sent today.  Working to get patient pill packs.  Labs ordered today. Will make recommendations based on lab results.  Follow up in 3 months.  Call sooner if concerns arise.       Relevant Medications   isosorbide mononitrate (IMDUR) 30 MG 24 hr tablet   rosuvastatin (CRESTOR) 20 MG tablet   Iron deficiency anemia due to chronic blood loss    Labs ordered today.  Not currently taking Iron.  Ifobt test ordered to evaluate for bleeding.  Will make recommendations based on lab results.      Relevant Medications   Iron, Ferrous Sulfate, 325 (65 Fe) MG TABS   Other Relevant Orders   CBC w/Diff (Completed)   Fecal occult blood, imunochemical   Prediabetes    Labs ordered at visit today.  Will make recommendations based on lab results.        Relevant Orders   HgB A1c (Completed)   Other Visit Diagnoses     Encounter for hepatitis C screening test for low risk patient       Relevant Orders   Hepatitis C Antibody (Completed)  Follow up plan: Return in about 3 months (around 11/24/2022).

## 2022-08-25 ENCOUNTER — Telehealth: Payer: Self-pay | Admitting: Nurse Practitioner

## 2022-08-25 LAB — CBC WITH DIFFERENTIAL/PLATELET
Basophils Absolute: 0.1 10*3/uL (ref 0.0–0.2)
Basos: 2 %
EOS (ABSOLUTE): 0.1 10*3/uL (ref 0.0–0.4)
Eos: 2 %
Hematocrit: 34.8 % — ABNORMAL LOW (ref 37.5–51.0)
Hemoglobin: 11.6 g/dL — ABNORMAL LOW (ref 13.0–17.7)
Immature Grans (Abs): 0 10*3/uL (ref 0.0–0.1)
Immature Granulocytes: 1 %
Lymphocytes Absolute: 1.5 10*3/uL (ref 0.7–3.1)
Lymphs: 28 %
MCH: 31.7 pg (ref 26.6–33.0)
MCHC: 33.3 g/dL (ref 31.5–35.7)
MCV: 95 fL (ref 79–97)
Monocytes Absolute: 0.8 10*3/uL (ref 0.1–0.9)
Monocytes: 14 %
NRBC: 1 % — ABNORMAL HIGH (ref 0–0)
Neutrophils Absolute: 2.9 10*3/uL (ref 1.4–7.0)
Neutrophils: 53 %
Platelets: 151 10*3/uL (ref 150–450)
RBC: 3.66 x10E6/uL — ABNORMAL LOW (ref 4.14–5.80)
RDW: 14.5 % (ref 11.6–15.4)
WBC: 5.3 10*3/uL (ref 3.4–10.8)

## 2022-08-25 LAB — HEMOGLOBIN A1C
Est. average glucose Bld gHb Est-mCnc: 103 mg/dL
Hgb A1c MFr Bld: 5.2 % (ref 4.8–5.6)

## 2022-08-25 LAB — COMPREHENSIVE METABOLIC PANEL
ALT: 38 IU/L (ref 0–44)
AST: 49 IU/L — ABNORMAL HIGH (ref 0–40)
Albumin/Globulin Ratio: 1.6 (ref 1.2–2.2)
Albumin: 4.1 g/dL (ref 3.8–4.9)
Alkaline Phosphatase: 76 IU/L (ref 44–121)
BUN/Creatinine Ratio: 26 — ABNORMAL HIGH (ref 9–20)
BUN: 23 mg/dL (ref 6–24)
Bilirubin Total: 0.5 mg/dL (ref 0.0–1.2)
CO2: 21 mmol/L (ref 20–29)
Calcium: 9 mg/dL (ref 8.7–10.2)
Chloride: 103 mmol/L (ref 96–106)
Creatinine, Ser: 0.87 mg/dL (ref 0.76–1.27)
Globulin, Total: 2.5 g/dL (ref 1.5–4.5)
Glucose: 96 mg/dL (ref 70–99)
Potassium: 4.4 mmol/L (ref 3.5–5.2)
Sodium: 138 mmol/L (ref 134–144)
Total Protein: 6.6 g/dL (ref 6.0–8.5)
eGFR: 104 mL/min/{1.73_m2} (ref >59)

## 2022-08-25 LAB — HEPATITIS C ANTIBODY: Hep C Virus Ab: NONREACTIVE

## 2022-08-25 LAB — CARBAMAZEPINE LEVEL, TOTAL: Carbamazepine (Tegretol), S: <2 ug/mL — ABNORMAL LOW (ref 4.0–12.0)

## 2022-08-25 NOTE — Assessment & Plan Note (Signed)
Chronic. Has not been taking Tegretol.  Missed his appt with Neurology.  Encouraged patient to call and make another appt. Refilled tegretol.  Levels ordered today due to question of compliance.

## 2022-08-25 NOTE — Assessment & Plan Note (Signed)
Chronic. Saw Pulmonology in June.  Supposed to have a follow up in October.  Endorses taking Trellegy however, I question compliance due to him likely needing a refill since it was prescribed.  Discussed smoking cessation with patient during visit. Follow up in 3 months for reevaluation.

## 2022-08-25 NOTE — Telephone Encounter (Signed)
[facility] has no wait and they can do either but states it does take a few days to get it prepped.

## 2022-08-25 NOTE — Assessment & Plan Note (Signed)
Chronic. Trying to drink less than 1 pint daily.  This is down from 1 whole pint.  Praised for his effort to decrease alcohol consumption.

## 2022-08-25 NOTE — Telephone Encounter (Signed)
Can we call [facility] and find out if there is a wait and if the patient has to come pick them up or if they are delivered.

## 2022-08-25 NOTE — Assessment & Plan Note (Signed)
Chronic. Not currently taking Crestor.  Refill sent today.  Working to get patient pill packs.  Labs ordered today. Will make recommendations based on lab results.  Follow up in 3 months.  Call sooner if concerns arise.

## 2022-08-25 NOTE — Assessment & Plan Note (Signed)
Chronic.  Controlled. Patient followed up with Heart Failure.  Patient does not know his medications.  He is not currently taking Imdur.  Continue with follow up with HF clinic.  Labs ordered today.  Appears Euvolemic.  Follow up in 3 months. Call sooner if concerns arise.  Working to get patient pill packs to help with compliance of medications.  - Reminded to call for an overnight weight gain of >2 pounds or a weekly weight gain of >5 pounds - not adding salt to food and read food labels. Reviewed the importance of keeping daily sodium intake to '2000mg'$  daily. - Avoid Ibuprofen products.

## 2022-08-25 NOTE — Assessment & Plan Note (Signed)
Labs ordered today.  Not currently taking Iron.  Ifobt test ordered to evaluate for bleeding.  Will make recommendations based on lab results.

## 2022-08-25 NOTE — Telephone Encounter (Signed)
Spoke with pharmacy staff, states they do offer this but there is a waitlist at the moment, she also mentioned [facility] also offers pill packs.

## 2022-08-25 NOTE — Telephone Encounter (Signed)
[facility] 

## 2022-08-25 NOTE — Telephone Encounter (Signed)
Can you please verify with patient that he will pick up the pill pack from [facility] if we initiate this for him.  Make sure he knows it is here in [place].

## 2022-08-25 NOTE — Assessment & Plan Note (Signed)
Labs ordered at visit today.  Will make recommendations based on lab results.   

## 2022-08-25 NOTE — Assessment & Plan Note (Signed)
Chronic.  Controlled.  Continue with current medication regimen.  Continue to collaborate with Cardiology.  Labs ordered today.  Return to clinic in 3 months for reevaluation.  Call sooner if concerns arise.   

## 2022-08-25 NOTE — Telephone Encounter (Signed)
Can we call [facility] in [place] and see if they do pill packs?

## 2022-08-25 NOTE — Progress Notes (Signed)
Please let patient know that his lab work is stable.  Please remind him to bring back the stool kit that we gave him yesterday.  Otherwise, his lab work looks good.  Conitnue with current medication regimen.

## 2022-08-26 ENCOUNTER — Ambulatory Visit (INDEPENDENT_AMBULATORY_CARE_PROVIDER_SITE_OTHER): Payer: BC Managed Care – PPO

## 2022-08-26 ENCOUNTER — Ambulatory Visit: Payer: BC Managed Care – PPO | Admitting: Nurse Practitioner

## 2022-08-26 DIAGNOSIS — F10920 Alcohol use, unspecified with intoxication, uncomplicated: Secondary | ICD-10-CM | POA: Diagnosis not present

## 2022-08-26 DIAGNOSIS — I5032 Chronic diastolic (congestive) heart failure: Secondary | ICD-10-CM | POA: Diagnosis not present

## 2022-08-26 DIAGNOSIS — I509 Heart failure, unspecified: Secondary | ICD-10-CM | POA: Diagnosis not present

## 2022-08-26 DIAGNOSIS — D500 Iron deficiency anemia secondary to blood loss (chronic): Secondary | ICD-10-CM | POA: Diagnosis not present

## 2022-08-26 DIAGNOSIS — Z23 Encounter for immunization: Secondary | ICD-10-CM

## 2022-09-26 DIAGNOSIS — I5032 Chronic diastolic (congestive) heart failure: Secondary | ICD-10-CM | POA: Diagnosis not present

## 2022-09-26 DIAGNOSIS — D500 Iron deficiency anemia secondary to blood loss (chronic): Secondary | ICD-10-CM | POA: Diagnosis not present

## 2022-09-26 DIAGNOSIS — I509 Heart failure, unspecified: Secondary | ICD-10-CM | POA: Diagnosis not present

## 2022-09-26 DIAGNOSIS — F10920 Alcohol use, unspecified with intoxication, uncomplicated: Secondary | ICD-10-CM | POA: Diagnosis not present

## 2022-10-04 ENCOUNTER — Encounter: Payer: Self-pay | Admitting: Family

## 2022-10-04 ENCOUNTER — Other Ambulatory Visit: Payer: Self-pay | Admitting: Family

## 2022-10-04 ENCOUNTER — Ambulatory Visit (HOSPITAL_BASED_OUTPATIENT_CLINIC_OR_DEPARTMENT_OTHER): Payer: BC Managed Care – PPO | Admitting: Family

## 2022-10-04 ENCOUNTER — Ambulatory Visit
Admission: RE | Admit: 2022-10-04 | Discharge: 2022-10-04 | Disposition: A | Discharge status: Home / Self Care | Payer: BC Managed Care – PPO | Source: Ambulatory Visit | Attending: Family | Admitting: Family

## 2022-10-04 ENCOUNTER — Encounter: Payer: BC Managed Care – PPO | Admitting: Family

## 2022-10-04 VITALS — BP 125/93 | HR 95 | Resp 18 | Wt 223.2 lb

## 2022-10-04 DIAGNOSIS — F17290 Nicotine dependence, other tobacco product, uncomplicated: Secondary | ICD-10-CM | POA: Insufficient documentation

## 2022-10-04 DIAGNOSIS — I5033 Acute on chronic diastolic (congestive) heart failure: Secondary | ICD-10-CM

## 2022-10-04 DIAGNOSIS — R569 Unspecified convulsions: Secondary | ICD-10-CM | POA: Insufficient documentation

## 2022-10-04 DIAGNOSIS — J449 Chronic obstructive pulmonary disease, unspecified: Secondary | ICD-10-CM

## 2022-10-04 DIAGNOSIS — I110 Hypertensive heart disease with heart failure: Secondary | ICD-10-CM | POA: Diagnosis not present

## 2022-10-04 DIAGNOSIS — F1090 Alcohol use, unspecified, uncomplicated: Secondary | ICD-10-CM | POA: Insufficient documentation

## 2022-10-04 DIAGNOSIS — F1010 Alcohol abuse, uncomplicated: Secondary | ICD-10-CM

## 2022-10-04 DIAGNOSIS — E785 Hyperlipidemia, unspecified: Secondary | ICD-10-CM | POA: Insufficient documentation

## 2022-10-04 DIAGNOSIS — I10 Essential (primary) hypertension: Secondary | ICD-10-CM | POA: Diagnosis not present

## 2022-10-04 LAB — BASIC METABOLIC PANEL
Anion gap: 10 (ref 5–15)
BUN: 15 mg/dL (ref 6–20)
CO2: 20 mmol/L — ABNORMAL LOW (ref 22–32)
Calcium: 8.3 mg/dL — ABNORMAL LOW (ref 8.9–10.3)
Chloride: 109 mmol/L (ref 98–111)
Creatinine, Ser: 0.58 mg/dL — ABNORMAL LOW (ref 0.61–1.24)
GFR, Estimated: >60 mL/min (ref >60)
Glucose, Bld: 75 mg/dL (ref 70–99)
Potassium: 3.6 mmol/L (ref 3.5–5.1)
Sodium: 139 mmol/L (ref 135–145)

## 2022-10-04 LAB — BRAIN NATRIURETIC PEPTIDE: B Natriuretic Peptide: 212.2 pg/mL — ABNORMAL HIGH (ref 0.0–100.0)

## 2022-10-04 MED ORDER — FUROSEMIDE 10 MG/ML IJ SOLN
INTRAMUSCULAR | Status: AC
  Administered 2022-10-04: 80 mg via INTRAVENOUS
  Filled 2022-10-04: qty 8

## 2022-10-04 MED ORDER — SACUBITRIL-VALSARTAN 24-26 MG PO TABS: 1.0000 | ORAL_TABLET | Freq: Two times a day (BID) | ORAL | 3 refills | Status: DC

## 2022-10-04 MED ORDER — POTASSIUM CHLORIDE CRYS ER 20 MEQ PO TBCR
EXTENDED_RELEASE_TABLET | ORAL | Status: AC
  Administered 2022-10-04: 40 meq via ORAL
  Filled 2022-10-04: qty 2

## 2022-10-04 MED ORDER — POTASSIUM CHLORIDE CRYS ER 20 MEQ PO TBCR: 40.0000 meq | EXTENDED_RELEASE_TABLET | Freq: Once | ORAL | Status: AC

## 2022-10-04 MED ORDER — POTASSIUM CHLORIDE ER 10 MEQ PO CPCR: 10.0000 meq | ORAL_CAPSULE | Freq: Every day | ORAL | 5 refills | Status: DC

## 2022-10-04 MED ORDER — FUROSEMIDE 10 MG/ML IJ SOLN: 80.0000 mg | Freq: Once | INTRAMUSCULAR | Status: AC

## 2022-10-04 MED ORDER — FUROSEMIDE 40 MG PO TABS: 40.0000 mg | ORAL_TABLET | Freq: Every day | ORAL | 3 refills | Status: DC

## 2022-10-04 NOTE — Patient Instructions (Addendum)
Continue weighing daily and call for an overnight weight gain of 3 pounds or more or a weekly weight gain of more than 5 pounds.   If you have voicemail, please make sure your mailbox is cleaned out so that we may leave a message and please make sure to listen to any voicemails.    Go pick up your lasix (fluid pill) and potassium pills.    Stop taking lisinopril and begin taking entresto as 1 tablet twice daily.    Take your medication bottles to [facility] and get them to reprint the labels and bring your bottles to every visit.    You can only drink 60-64 ounces of liquids daily so you have to decrease your intake. Do not add anymore salt to your food and do not drink anymore gatorade

## 2022-10-04 NOTE — Progress Notes (Signed)
Patient ID: Jose [name]., male    DOB: June 01, 1971, 51 y.o.   MRN: [id number]  Mr Malek is a 51 y/o male with a history of hyperlipidemia, HTN, current tobacco/ alcohol use and chronic heart failure.   Echo report from 11/05/21 reviewed and showed an EF of 60-65% along with moderate LVH. Echo report from 01/29/2019 reviewed and showed an EF of 60-65%  Was in the ED 06/27/22 due to COPD exacerbation and alcohol intoxication where he was evaluated and released.   He presents today for a follow-up visit with a chief complaint of moderate shortness of breath with minimal exertion. Describes this as chronic although feels like it's worsened over the last 1-2 weeks. He has associated wheezing, cough, pedal edema (worsening), abdominal distention (worsening), palpitations and difficulty sleeping (orthopnea) along with this. Denies any dizziness, chest pain or fatigue.   Not weighing daily but does have scales. Drinking too much fluids: 20 oz gatorade, 40 oz Mtn Dew, 64 oz water and 10 oz (1 pint) liquor daily which =140 ounces of fluid daily  Admits to adding salt to his food. Doesn't think he's been taking his furosemide and has been out of potassium for several weeks. Says that he generally takes his carvedilol once daily and his hydralazine twice daily. Admits that he doesn't know what he's taking (and he didn't bring them) and he can't read the labels on the bottles because the ink has rubbed off.   Past Medical History:  Diagnosis Date   Alcohol abuse    Cervicalgia    CHF (congestive heart failure) (HCC)    Hyperlipidemia    Hypertension    Seizures ([place])    Past Surgical History:  Procedure Laterality Date   COLONOSCOPY WITH PROPOFOL N/A 01/16/2018   Procedure: COLONOSCOPY WITH PROPOFOL;  Surgeon: Lin Landsman, MD;  Location: [facility] ENDOSCOPY;  Service: Gastroenterology;  Laterality: N/A;   ESOPHAGOGASTRODUODENOSCOPY (EGD) WITH PROPOFOL N/A 01/16/2018   Procedure:  ESOPHAGOGASTRODUODENOSCOPY (EGD) WITH PROPOFOL;  Surgeon: Lin Landsman, MD;  Location: [facility] ENDOSCOPY;  Service: Gastroenterology;  Laterality: N/A;   Family History  Problem Relation Age of Onset   Diabetes Mother    Colon cancer Mother    Hypertension Father    Multiple sclerosis Father    Social History   Tobacco Use   Smoking status: Every Day    Packs/day: 1.00    Years: 33.00    Total pack years: 33.00    Types: Cigars, Cigarettes    Start date: 67    Last attempt to quit: 2020    Years since quitting: 3.9   Smokeless tobacco: Former   Tobacco comments:    2 cigars a day-05/05/2022    Quit smoking cigarettes 3 years ago.  Smokes cigarettes since he was 51 years old.  Substance Use Topics   Alcohol use: Yes    Alcohol/week: 28.0 standard drinks of alcohol    Types: 7 Cans of beer, 21 Shots of liquor per week    Comment: half pint liquor per day   No Known Allergies  Prior to Admission medications   Medication Sig Start Date End Date Taking? Authorizing Provider  albuterol (VENTOLIN HFA) 108 (90 Base) MCG/ACT inhaler Inhale 2 puffs into the lungs every 6 (six) hours as needed for wheezing or shortness of breath. 06/27/22  Yes Lucillie Garfinkel, MD  amLODipine (NORVASC) 10 MG tablet Take 1 tablet (10 mg total) by mouth daily. 04/18/22  Yes Jon Billings, NP  carbamazepine (TEGRETOL) 200 MG tablet Take 1 tablet (200 mg total) by mouth 2 (two) times daily. 08/24/22  Yes Jon Billings, NP  carvedilol (COREG) 25 MG tablet Take 1 tablet (25 mg total) by mouth 2 (two) times daily with a meal. 04/18/22  Yes Jon Billings, NP  empagliflozin (JARDIANCE) 10 MG TABS tablet Take 1 tablet (10 mg total) by mouth daily before breakfast. 05/26/22  Yes Jon Billings, NP  Fluticasone-Umeclidin-Vilant (TRELEGY ELLIPTA) 100-62.5-25 MCG/ACT AEPB Inhale 1 puff into the lungs daily. 08/24/22  Yes Jon Billings, NP  hydrALAZINE (APRESOLINE) 50 MG tablet Take 1 tablet (50 mg  total) by mouth 3 (three) times daily. 04/18/22  Yes Jon Billings, NP  Iron, Ferrous Sulfate, 325 (65 Fe) MG TABS Take 325 mg by mouth daily. 08/24/22  Yes Jon Billings, NP  isosorbide mononitrate (IMDUR) 30 MG 24 hr tablet Take 1 tablet (30 mg total) by mouth daily. 08/24/22  Yes Jon Billings, NP  lisinopril (ZESTRIL) 40 MG tablet Take 1 tablet (40 mg total) by mouth daily. 04/18/22 10/15/22 Yes Jon Billings, NP  rosuvastatin (CRESTOR) 20 MG tablet Take 1 tablet (20 mg total) by mouth daily. 08/24/22  Yes Jon Billings, NP  furosemide (LASIX) 40 MG tablet Take 1 tablet (40 mg total) by mouth daily. Increased from 20 mg daily. Patient not taking: Reported on 10/04/2022 05/26/22 11/22/22  Jon Billings, NP  potassium chloride (MICRO-K) 10 MEQ CR capsule Take 20 mEq by mouth daily. Patient not taking: Reported on 10/04/2022    [provider]    Review of Systems  Constitutional:  Negative for appetite change and fatigue.  HENT:  Positive for hearing loss. Negative for congestion, rhinorrhea and sore throat.   Eyes: Negative.   Respiratory:  Positive for shortness of breath and wheezing. Negative for cough and chest tightness.   Cardiovascular:  Positive for palpitations (occasionally) and leg swelling (worsening). Negative for chest pain.  Gastrointestinal:  Positive for abdominal distention (worsening). Negative for abdominal pain.  Endocrine: Negative.   Genitourinary: Negative.   Musculoskeletal:  Positive for arthralgias (right knee). Negative for back pain.  Skin: Negative.   Allergic/Immunologic: Negative.   Neurological:  Negative for dizziness, tremors and light-headedness.  Hematological:  Negative for adenopathy. Does not bruise/bleed easily.  Psychiatric/Behavioral:  Positive for sleep disturbance (sleeping on 3-4 pillows). Negative for dysphoric mood. The patient is not nervous/anxious.    Vitals:   10/04/22 1359  BP: (!) 125/93  Pulse: 95   Resp: 18  SpO2: 94%  Weight: 223 lb 4 oz (101.3 kg)   Wt Readings from Last 3 Encounters:  10/04/22 223 lb 4 oz (101.3 kg)  08/24/22 207 lb 1.6 oz (93.9 kg)  06/30/22 199 lb 6.4 oz (90.4 kg)   Lab Results  Component Value Date   CREATININE 0.87 08/24/2022   CREATININE 0.68 (L) 06/30/2022   CREATININE 0.95 06/27/2022    Physical Exam Vitals and nursing note reviewed.  Constitutional:      Appearance: Normal appearance.  HENT:     Head: Normocephalic and atraumatic.     Right Ear: Decreased hearing noted.     Left Ear: Decreased hearing noted.  Neck:     Vascular: No JVD.  Cardiovascular:     Rate and Rhythm: Normal rate and regular rhythm.     Heart sounds: Normal heart sounds.  Pulmonary:     Effort: Pulmonary effort is normal.     Breath sounds: No wheezing, rhonchi or rales.  Abdominal:  General: There is distension.     Tenderness: There is no abdominal tenderness.  Musculoskeletal:        General: No tenderness.     Cervical back: Normal range of motion and neck supple.     Right lower leg: No edema (2+ pitting to knee).     Left lower leg: No edema (2+ pitting to knee).  Skin:    General: Skin is warm and dry.  Neurological:     General: No focal deficit present.     Mental Status: He is alert and oriented to person, place, and time.     Motor: No tremor.  Psychiatric:        Mood and Affect: Mood normal.        Behavior: Behavior normal.     Assessment & Plan:  1. Acute on 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 (LVH)- - NYHA class III - moderately fluid overloaded today with weight gain, swelling and worsening SOB - not weighing daily; emphasized weighing daily so that he can call for an overnight weight gain of > 2 pounds or a weekly weight gain of > 5 pounds - weight up 23 pounds from last visit here 3 months ago - admits to using salt and emphasized not adding salt to his food; reviewed that he can use Mrs  Deliah Boston, NoSalt, pepper, garlic powder; no ACCENT - reviewed his current daily fluid intake of 140 ounces and that he needs to decrease it to 60-64 ounces daily; avoid gatorade  - discussed giving '80mg'$  IV lasix or going home with furoscix; he opts to do the IV lasix so sent him for '80mg'$  lasix/ 14mq potassium - BMP/BNP to be drawn - on GDMT of jardiance - will stop lisinopril and begin entresto 24/'26mg'$  BID; 30 day voucher provided - send in new RX for furosemide and potassium - BNP 06/27/22 was 51.4  2: HTN- - BP mildly elevated (125/93) - saw PCP (Mathis Dad 08/24/22 - BMP from 08/24/22 reviewed and showed sodium 138, potassium 4.4, creatinine 0.87 and GFR 104  3: COPD-  - still smoking tobacco 2 cigars a day - complete cessation discussed for 2 minutes with him - saw pulmonology (Patsey Berthold 05/05/22  4: Chronic alcohol use- - drinks 1 pint of vodka daily in addition to beer  - cessation discussed with patient for 3 mins  5: Seizures- - 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 Emphasized that he take his bottles to [facility] and have them reprint the labels so that he knows what he is taking and how he is supposed to take them  Return in 2 days, he says that he can't return tomorrow because he has to work. Should symptoms worsen, he needs to present to the ED.

## 2022-10-05 NOTE — Progress Notes (Signed)
Patient ID: Jose Jose [name]., male    DOB: April 16, 1971, 51 y.o.   MRN: [id number]  Jose Jose [name] is Jose [name] 52 y/o male with Jose [name] history of hyperlipidemia, HTN, current tobacco/ alcohol use and chronic heart failure.   Echo report from 11/05/21 reviewed and showed an EF of 60-65% along with moderate LVH. Echo report from 01/29/2019 reviewed and showed an EF of 60-65%  Was in the ED 06/27/22 due to COPD exacerbation and alcohol intoxication where he was evaluated and released.   He presents today for Jose [name] follow-up visit with Jose [name] chief complaint of minimal shortness of breath with moderate exertion. Describes this as chronic although has improved from last visit here after getting IV lasix 2 days ago. He has associated wheezing, pedal edema (improving), abdominal distention (improving) and difficulty sleeping along with this. He denies any dizziness, palpitations, chest pain, cough, fatigue or weight gain.   Weighing daily @ work. Drinking less fluids and feels like he's cut his fluid intake in 1/2 since last here 2 days ago.  Past Medical History:  Diagnosis Date   Alcohol abuse    Cervicalgia    CHF (congestive heart failure) (HCC)    Hyperlipidemia    Hypertension    Seizures ([place])    Past Surgical History:  Procedure Laterality Date   COLONOSCOPY WITH PROPOFOL N/Jose [name] 01/16/2018   Procedure: COLONOSCOPY WITH PROPOFOL;  Surgeon: Lin Landsman, MD;  Location[facility] ENDOSCOPY;  Service: Gastroenterology;  Laterality: N/Jose [name];   ESOPHAGOGASTRODUODENOSCOPY (EGD) WITH PROPOFOL N/Jose [name] 01/16/2018   Procedure: ESOPHAGOGASTRODUODENOSCOPY (EGD) WITH PROPOFOL;  Surgeon: Lin Landsman, MD;  Location: [facility] ENDOSCOPY;  Service: Gastroenterology;  Laterality: N/Jose [name];   Family History  Problem Relation Age of Onset   Diabetes Mother    Colon cancer Mother    Hypertension Father    Multiple sclerosis Father    Social History   Tobacco Use   Smoking status: Every Day    Packs/day: 1.00    Years: 33.00    Total pack  years: 33.00    Types: Cigars, Cigarettes    Start date: 40    Last attempt to quit: 2020    Years since quitting: 3.9   Smokeless tobacco: Former   Tobacco comments:    2 cigars Jose [name] day-05/05/2022    Quit smoking cigarettes 3 years ago.  Smokes cigarettes since he was 51 years old.  Substance Use Topics   Alcohol use: Yes    Alcohol/week: 28.0 standard drinks of alcohol    Types: 7 Cans of beer, 21 Shots of liquor per week    Comment: half pint liquor per day   No Known Allergies  Prior to Admission medications   Medication Sig Start Date End Date Taking? Authorizing Provider  albuterol (VENTOLIN HFA) 108 (90 Base) MCG/ACT inhaler Inhale 2 puffs into the lungs every 6 (six) hours as needed for wheezing or shortness of breath. 06/27/22  Yes Jose Garfinkel, MD  amLODipine (NORVASC) 10 MG tablet Take 1 tablet (10 mg total) by mouth daily. 04/18/22  Yes Jose Billings, NP  carbamazepine (TEGRETOL) 200 MG tablet Take 1 tablet (200 mg total) by mouth 2 (two) times daily. 08/24/22  Yes Jose Billings, NP  carvedilol (COREG) 25 MG tablet Take 1 tablet (25 mg total) by mouth 2 (two) times daily with Jose [name] meal. 04/18/22  Yes Jose Billings, NP  empagliflozin (JARDIANCE) 10 MG TABS tablet Take 1 tablet (10 mg total) by mouth daily before breakfast. 05/26/22  Yes Jose Jose [name],  Jose Glad, NP  Fluticasone-Umeclidin-Vilant (TRELEGY ELLIPTA) 100-62.5-25 MCG/ACT AEPB Inhale 1 puff into the lungs daily. 08/24/22  Yes Jose Billings, NP  furosemide (LASIX) 40 MG tablet Take 1 tablet (40 mg total) by mouth daily. 10/04/22  Yes Jose Price Jose [name], Jose Jose [name]  hydrALAZINE (APRESOLINE) 50 MG tablet Take 1 tablet (50 mg total) by mouth 3 (three) times daily. 04/18/22  Yes Jose Billings, NP  Iron, Ferrous Sulfate, 325 (65 Fe) MG TABS Take 325 mg by mouth daily. 08/24/22  Yes Jose Billings, NP  isosorbide mononitrate (IMDUR) 30 MG 24 hr tablet Take 1 tablet (30 mg total) by mouth daily. 08/24/22  Yes Jose Billings,  NP  potassium chloride (MICRO-K) 10 MEQ CR capsule Take 1 capsule (10 mEq total) by mouth daily. 10/04/22  Yes Jose Price Jose [name], Jose Jose [name]  rosuvastatin (CRESTOR) 20 MG tablet Take 1 tablet (20 mg total) by mouth daily. 08/24/22  Yes Jose Billings, NP  sacubitril-valsartan (ENTRESTO) 24-26 MG Take 1 tablet by mouth 2 (two) times daily. 10/04/22  Yes Jose Graff, Jose Jose [name]    Review of Systems  Constitutional:  Negative for appetite change and fatigue.  HENT:  Positive for hearing loss. Negative for congestion, rhinorrhea and sore throat.   Eyes: Negative.   Respiratory:  Positive for shortness of breath (improving) and wheezing. Negative for cough and chest tightness.   Cardiovascular:  Positive for leg swelling (improving). Negative for chest pain and palpitations.  Gastrointestinal:  Positive for abdominal distention (improving). Negative for abdominal pain.  Endocrine: Negative.   Genitourinary:  Negative for decreased urine volume.  Musculoskeletal:  Positive for arthralgias (right knee). Negative for back pain.  Skin: Negative.   Allergic/Immunologic: Negative.   Neurological:  Negative for dizziness, tremors and light-headedness.  Hematological:  Negative for adenopathy. Does not bruise/bleed easily.  Psychiatric/Behavioral:  Positive for sleep disturbance (sleeping on 2 pillows). Negative for dysphoric mood. The patient is not nervous/anxious.    Vitals:   10/06/22 1449  BP: (!) 136/90  Pulse: 74  Resp: 20  SpO2: 96%  Weight: 213 lb (96.6 kg)   Wt Readings from Last 3 Encounters:  10/06/22 213 lb (96.6 kg)  10/04/22 223 lb 4 oz (101.3 kg)  08/24/22 207 lb 1.6 oz (93.9 kg)   Lab Results  Component Value Date   CREATININE 0.71 10/06/2022   CREATININE 0.58 (L) 10/04/2022   CREATININE 0.87 08/24/2022    Physical Exam Vitals and nursing note reviewed.  Constitutional:      Appearance: Normal appearance.  HENT:     Head: Normocephalic and atraumatic.     Right Ear:  Decreased hearing noted.     Left Ear: Decreased hearing noted.  Neck:     Vascular: No JVD.  Cardiovascular:     Rate and Rhythm: Normal rate and regular rhythm.     Heart sounds: Normal heart sounds.  Pulmonary:     Effort: Pulmonary effort is normal.     Breath sounds: No wheezing, rhonchi or rales.  Abdominal:     General: There is distension (softer from 2 days ago).     Palpations: Abdomen is soft.     Tenderness: There is no abdominal tenderness.  Musculoskeletal:        General: No tenderness.     Cervical back: Normal range of motion and neck supple.     Right lower leg: No tenderness. Edema (1+ pitting) present.     Left lower leg: No tenderness. Edema (1+ pitting) present.  Skin:  General: Skin is warm and dry.  Neurological:     General: No focal deficit present.     Mental Status: He is alert and oriented to person, place, and time.     Motor: No tremor.  Psychiatric:        Mood and Affect: Mood normal.        Behavior: Behavior normal.     Assessment & Plan:  1. 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 (LVH)- - NYHA class II - minimally fluid overloaded but markedly improved from last visit - weighing daily at work; reminded to call for an overnight weight gain of > 2 pounds or Jose [name] weekly weight gain of > 5 pounds - weight down 10 pounds from last visit here 2 days ago ago - admits to using salt and emphasized not adding salt to his food; reviewed that he can use Mrs Deliah Boston, NoSalt, pepper, garlic powder; no ACCENT - says that he's decreased his fluid intake to 1/2 (so this would be ~ 70 ounces); encouraged to decrease it Jose [name] little more to 60-64 total ounces - given IV lasix 2 days ago with much improvement of symptoms - will recheck BMP today - on GDMT of jardiance & entresto - will increase entresto to 49/'51mg'$ ; he can take 2 tablets BID of his current dose - will send in new RX and give commercial copay card at next  visit - get compression socks and put them on every morning with removal at bedtime - BNP 10/04/22 was 212.2  2: HTN- - BP mildly elevated (136/90) - saw PCP Jose Jose [name]) 08/24/22 - BMP from 10/04/22 reviewed and showed sodium 139, potassium 3.6, creatinine 0.58 and GFR >60  3: COPD-  - still smoking tobacco 2 cigars Jose [name] day - complete cessation discussed for 2 minutes with him - saw pulmonology Jose Jose [name]) 05/05/22  4: Chronic alcohol use- - drinks 1 pint of vodka daily in addition to beer  - cessation discussed with patient for 3 mins  5: Seizures- - he asks about driving and operating heavy machinery at work - explained that he needed to f/u with PCP about this as I think he has to be seizure free for 6 months   Medication bottles reviewed.   Return in 2 weeks, sooner if needed.

## 2022-10-06 ENCOUNTER — Ambulatory Visit (HOSPITAL_BASED_OUTPATIENT_CLINIC_OR_DEPARTMENT_OTHER): Payer: BC Managed Care – PPO | Admitting: Family

## 2022-10-06 ENCOUNTER — Other Ambulatory Visit
Admission: RE | Admit: 2022-10-06 | Discharge: 2022-10-06 | Disposition: A | Discharge status: Home / Self Care | Payer: BC Managed Care – PPO | Source: Ambulatory Visit | Attending: Family | Admitting: Family

## 2022-10-06 ENCOUNTER — Encounter: Payer: Self-pay | Admitting: Family

## 2022-10-06 VITALS — BP 136/90 | HR 74 | Resp 20 | Wt 213.0 lb

## 2022-10-06 DIAGNOSIS — I5032 Chronic diastolic (congestive) heart failure: Secondary | ICD-10-CM

## 2022-10-06 DIAGNOSIS — F1010 Alcohol abuse, uncomplicated: Secondary | ICD-10-CM

## 2022-10-06 DIAGNOSIS — J449 Chronic obstructive pulmonary disease, unspecified: Secondary | ICD-10-CM | POA: Diagnosis not present

## 2022-10-06 DIAGNOSIS — I10 Essential (primary) hypertension: Secondary | ICD-10-CM

## 2022-10-06 DIAGNOSIS — R569 Unspecified convulsions: Secondary | ICD-10-CM

## 2022-10-06 LAB — BASIC METABOLIC PANEL
Anion gap: 10 (ref 5–15)
BUN: 19 mg/dL (ref 6–20)
CO2: 25 mmol/L (ref 22–32)
Calcium: 8.6 mg/dL — ABNORMAL LOW (ref 8.9–10.3)
Chloride: 104 mmol/L (ref 98–111)
Creatinine, Ser: 0.71 mg/dL (ref 0.61–1.24)
GFR, Estimated: >60 mL/min (ref >60)
Glucose, Bld: 92 mg/dL (ref 70–99)
Potassium: 3.8 mmol/L (ref 3.5–5.1)
Sodium: 139 mmol/L (ref 135–145)

## 2022-10-06 NOTE — Patient Instructions (Addendum)
Continue weighing daily and call for an overnight weight gain of 3 pounds or more or a weekly weight gain of more than 5 pounds.   If you have voicemail, please make sure your mailbox is cleaned out so that we may leave a message and please make sure to listen to any voicemails.     Increase your entresto to 2 tablets in the morning and 2 tablets in the evening.     Keep daily fluid intake to 60-64 ounces daily

## 2022-10-07 ENCOUNTER — Encounter: Payer: Self-pay | Admitting: Family

## 2022-10-20 ENCOUNTER — Ambulatory Visit (HOSPITAL_BASED_OUTPATIENT_CLINIC_OR_DEPARTMENT_OTHER): Payer: BC Managed Care – PPO | Admitting: Family

## 2022-10-20 ENCOUNTER — Other Ambulatory Visit: Payer: Self-pay | Admitting: Family

## 2022-10-20 ENCOUNTER — Encounter: Payer: Self-pay | Admitting: Family

## 2022-10-20 ENCOUNTER — Other Ambulatory Visit
Admission: RE | Admit: 2022-10-20 | Discharge: 2022-10-20 | Disposition: A | Discharge status: Home / Self Care | Payer: BC Managed Care – PPO | Source: Ambulatory Visit | Attending: Family | Admitting: Family

## 2022-10-20 VITALS — BP 110/69 | HR 100 | Resp 20 | Wt 209.5 lb

## 2022-10-20 DIAGNOSIS — Z7984 Long term (current) use of oral hypoglycemic drugs: Secondary | ICD-10-CM | POA: Insufficient documentation

## 2022-10-20 DIAGNOSIS — J441 Chronic obstructive pulmonary disease with (acute) exacerbation: Secondary | ICD-10-CM | POA: Insufficient documentation

## 2022-10-20 DIAGNOSIS — R0602 Shortness of breath: Secondary | ICD-10-CM | POA: Insufficient documentation

## 2022-10-20 DIAGNOSIS — Z79899 Other long term (current) drug therapy: Secondary | ICD-10-CM | POA: Diagnosis not present

## 2022-10-20 DIAGNOSIS — I110 Hypertensive heart disease with heart failure: Secondary | ICD-10-CM | POA: Insufficient documentation

## 2022-10-20 DIAGNOSIS — E785 Hyperlipidemia, unspecified: Secondary | ICD-10-CM | POA: Diagnosis not present

## 2022-10-20 DIAGNOSIS — I10 Essential (primary) hypertension: Secondary | ICD-10-CM

## 2022-10-20 DIAGNOSIS — I5032 Chronic diastolic (congestive) heart failure: Secondary | ICD-10-CM

## 2022-10-20 DIAGNOSIS — J449 Chronic obstructive pulmonary disease, unspecified: Secondary | ICD-10-CM

## 2022-10-20 DIAGNOSIS — F1090 Alcohol use, unspecified, uncomplicated: Secondary | ICD-10-CM | POA: Insufficient documentation

## 2022-10-20 DIAGNOSIS — F17290 Nicotine dependence, other tobacco product, uncomplicated: Secondary | ICD-10-CM | POA: Insufficient documentation

## 2022-10-20 DIAGNOSIS — F1010 Alcohol abuse, uncomplicated: Secondary | ICD-10-CM

## 2022-10-20 LAB — BASIC METABOLIC PANEL
Anion gap: 9 (ref 5–15)
BUN: 23 mg/dL — ABNORMAL HIGH (ref 6–20)
CO2: 26 mmol/L (ref 22–32)
Calcium: 8.8 mg/dL — ABNORMAL LOW (ref 8.9–10.3)
Chloride: 104 mmol/L (ref 98–111)
Creatinine, Ser: 0.8 mg/dL (ref 0.61–1.24)
GFR, Estimated: >60 mL/min (ref >60)
Glucose, Bld: 103 mg/dL — ABNORMAL HIGH (ref 70–99)
Potassium: 3.7 mmol/L (ref 3.5–5.1)
Sodium: 139 mmol/L (ref 135–145)

## 2022-10-20 MED ORDER — SACUBITRIL-VALSARTAN 49-51 MG PO TABS: 1.0000 | ORAL_TABLET | Freq: Two times a day (BID) | ORAL | 5 refills | Status: DC

## 2022-10-20 NOTE — Patient Instructions (Addendum)
Continue weighing daily and call for an overnight weight gain of 3 pounds or more or a weekly weight gain of more than 5 pounds.   If you have voicemail, please make sure your mailbox is cleaned out so that we may leave a message and please make sure to listen to any voicemails.    Go to the [facility] and get your lab work done.    When you pick up the new entresto prescription, it will be the higher dose and you will then resume taking 1 tablet in the morning and 1 tablet in the evening.

## 2022-10-20 NOTE — Progress Notes (Signed)
Patient ID: Jose Dakins., male    DOB: May 14, 1971, 51 y.o.   MRN: [id number]  Jose [name] is a 51 y/o male with a history of hyperlipidemia, HTN, current tobacco/ alcohol use and chronic heart failure.   Echo report from 11/05/21 reviewed and showed an EF of 60-65% along with moderate LVH. Echo report from 01/29/2019 reviewed and showed an EF of 60-65%  Was in the ED 06/27/22 due to COPD exacerbation and alcohol intoxication where he was evaluated and released.   He presents today for a follow-up visit with a chief complaint of minimal shortness of breath upon moderate exertion. Describes this as chronic although does feel like his breathing continues to improve. He has associated wheezing, pedal edema, palpitations and difficulty sleeping along with this. He denies any dizziness, abdominal distention, chest pain, cough, fatigue or weight gain.   Past Medical History:  Diagnosis Date   Alcohol abuse    Cervicalgia    CHF (congestive heart failure) (HCC)    Hyperlipidemia    Hypertension    Seizures ([place])    Past Surgical History:  Procedure Laterality Date   COLONOSCOPY WITH PROPOFOL N/A 01/16/2018   Procedure: COLONOSCOPY WITH PROPOFOL;  Surgeon: Lin Landsman, MD;  Location: [facility] ENDOSCOPY;  Service: Gastroenterology;  Laterality: N/A;   ESOPHAGOGASTRODUODENOSCOPY (EGD) WITH PROPOFOL N/A 01/16/2018   Procedure: ESOPHAGOGASTRODUODENOSCOPY (EGD) WITH PROPOFOL;  Surgeon: Lin Landsman, MD;  Location: [facility] ENDOSCOPY;  Service: Gastroenterology;  Laterality: N/A;   Family History  Problem Relation Age of Onset   Diabetes Mother    Colon cancer Mother    Hypertension Father    Multiple sclerosis Father    Social History   Tobacco Use   Smoking status: Every Day    Packs/day: 1.00    Years: 33.00    Total pack years: 33.00    Types: Cigars, Cigarettes    Start date: 66    Last attempt to quit: 2020    Years since quitting: 3.9   Smokeless tobacco: Former   Tobacco  comments:    2 cigars a day-05/05/2022    Quit smoking cigarettes 3 years ago.  Smokes cigarettes since he was 51 years old.  Substance Use Topics   Alcohol use: Yes    Alcohol/week: 28.0 standard drinks of alcohol    Types: 7 Cans of beer, 21 Shots of liquor per week    Comment: half pint liquor per day   No Known Allergies  Prior to Admission medications   Medication Sig Start Date End Date Taking? Authorizing Provider  albuterol (VENTOLIN HFA) 108 (90 Base) MCG/ACT inhaler Inhale 2 puffs into the lungs every 6 (six) hours as needed for wheezing or shortness of breath. 06/27/22  Yes Lucillie Garfinkel, MD  amLODipine (NORVASC) 10 MG tablet Take 1 tablet (10 mg total) by mouth daily. 04/18/22  Yes Jon Billings, NP  carbamazepine (TEGRETOL) 200 MG tablet Take 1 tablet (200 mg total) by mouth 2 (two) times daily. 08/24/22  Yes Jon Billings, NP  carvedilol (COREG) 25 MG tablet Take 1 tablet (25 mg total) by mouth 2 (two) times daily with a meal. 04/18/22  Yes Jon Billings, NP  empagliflozin (JARDIANCE) 10 MG TABS tablet Take 1 tablet (10 mg total) by mouth daily before breakfast. 05/26/22  Yes Jon Billings, NP  Fluticasone-Umeclidin-Vilant (TRELEGY ELLIPTA) 100-62.5-25 MCG/ACT AEPB Inhale 1 puff into the lungs daily. 08/24/22  Yes Jon Billings, NP  furosemide (LASIX) 40 MG tablet Take 1 tablet (  40 mg total) by mouth daily. 10/04/22  Yes Darylene Price A, FNP  hydrALAZINE (APRESOLINE) 50 MG tablet Take 1 tablet (50 mg total) by mouth 3 (three) times daily. 04/18/22  Yes Jon Billings, NP  Iron, Ferrous Sulfate, 325 (65 Fe) MG TABS Take 325 mg by mouth daily. 08/24/22  Yes Jon Billings, NP  potassium chloride (MICRO-K) 10 MEQ CR capsule Take 1 capsule (10 mEq total) by mouth daily. 10/04/22  Yes Darylene Price A, FNP  rosuvastatin (CRESTOR) 20 MG tablet Take 1 tablet (20 mg total) by mouth daily. 08/24/22  Yes Jon Billings, NP  sacubitril-valsartan (ENTRESTO) 24-26 MG Take  1 tablet by mouth 2 (two) times daily. Patient taking differently: Take 2 tablets by mouth 2 (two) times daily. 10/04/22  Yes Darylene Price A, FNP  isosorbide mononitrate (IMDUR) 30 MG 24 hr tablet Take 1 tablet (30 mg total) by mouth daily. Patient not taking: Reported on 10/20/2022 08/24/22   Jon Billings, NP   Review of Systems  Constitutional:  Negative for appetite change and fatigue.  HENT:  Positive for hearing loss. Negative for congestion, rhinorrhea and sore throat.   Eyes: Negative.   Respiratory:  Positive for shortness of breath (improving) and wheezing. Negative for cough and chest tightness.   Cardiovascular:  Positive for palpitations and leg swelling (improving). Negative for chest pain.  Gastrointestinal:  Negative for abdominal distention and abdominal pain.  Endocrine: Negative.   Genitourinary:  Negative for decreased urine volume.  Musculoskeletal:  Positive for arthralgias (right knee). Negative for back pain.  Skin: Negative.   Allergic/Immunologic: Negative.   Neurological:  Negative for dizziness, tremors and light-headedness.  Hematological:  Negative for adenopathy. Does not bruise/bleed easily.  Psychiatric/Behavioral:  Positive for sleep disturbance (sleeping on 2 pillows). Negative for dysphoric mood. The patient is not nervous/anxious.    Vitals:   10/20/22 1551  BP: 110/69  Pulse: 100  Resp: 20  SpO2: 96%  Weight: 209 lb 8 oz (95 kg)   Wt Readings from Last 3 Encounters:  10/20/22 209 lb 8 oz (95 kg)  10/06/22 213 lb (96.6 kg)  10/04/22 223 lb 4 oz (101.3 kg)   Lab Results  Component Value Date   CREATININE 0.80 10/20/2022   CREATININE 0.71 10/06/2022   CREATININE 0.58 (L) 10/04/2022   Physical Exam Vitals and nursing note reviewed.  Constitutional:      Appearance: Normal appearance.  HENT:     Head: Normocephalic and atraumatic.     Right Ear: Decreased hearing noted.     Left Ear: Decreased hearing noted.  Neck:     Vascular:  No JVD.  Cardiovascular:     Rate and Rhythm: Normal rate and regular rhythm.     Heart sounds: Normal heart sounds.  Pulmonary:     Effort: Pulmonary effort is normal.     Breath sounds: No wheezing, rhonchi or rales.  Abdominal:     General: There is no distension.     Palpations: Abdomen is soft.     Tenderness: There is no abdominal tenderness.  Musculoskeletal:        General: No tenderness.     Cervical back: Normal range of motion and neck supple.     Right lower leg: No tenderness. Edema (trace pitting) present.     Left lower leg: No tenderness. Edema (trace pitting) present.  Skin:    General: Skin is warm and dry.  Neurological:     General: No focal deficit present.  Mental Status: He is alert and oriented to person, place, and time.     Motor: No tremor.  Psychiatric:        Mood and Affect: Mood normal.        Behavior: Behavior normal.     Assessment & Plan:  1. 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 (LVH)- - NYHA class II - euvolemic today - weighing daily at work; reminded to call for an overnight weight gain of > 2 pounds or a weekly weight gain of > 5 pounds - weight down 4 pounds from last visit here 2 weeks ago - reviewed not adding salt to his food - on GDMT of jardiance & entresto - check BMP today since entresto increased at last visit - entresto commercial copay card provided today - referral made to Dr. Haroldine Laws for further evaluation - BNP 10/04/22 was 212.2  2: HTN- - BP looks great today (110.69) - saw PCP Mathis Dad) 08/24/22 - BMP from 10/06/22 reviewed and showed sodium 139, potassium 3.8, creatinine 0.71 and GFR >60 - checking BMP today  3: COPD-  - still smoking tobacco 2 cigars a day - complete cessation discussed with him - saw pulmonology Patsey Berthold) 05/05/22  4: Chronic alcohol use- - drinks 1 pint of vodka daily in addition to beer  - cessation discussed with patient for 3  mins   Medication bottles reviewed.   To see Dr. Haroldine Laws 11/15/22. Call if any issues arise prior to that appointment.

## 2022-10-26 DIAGNOSIS — D500 Iron deficiency anemia secondary to blood loss (chronic): Secondary | ICD-10-CM | POA: Diagnosis not present

## 2022-10-26 DIAGNOSIS — I509 Heart failure, unspecified: Secondary | ICD-10-CM | POA: Diagnosis not present

## 2022-10-26 DIAGNOSIS — I5032 Chronic diastolic (congestive) heart failure: Secondary | ICD-10-CM | POA: Diagnosis not present

## 2022-10-26 DIAGNOSIS — F10920 Alcohol use, unspecified with intoxication, uncomplicated: Secondary | ICD-10-CM | POA: Diagnosis not present

## 2022-11-10 ENCOUNTER — Emergency Department (HOSPITAL_COMMUNITY): Payer: BC Managed Care – PPO

## 2022-11-10 ENCOUNTER — Inpatient Hospital Stay (HOSPITAL_COMMUNITY)
Admission: EM | Admit: 2022-11-10 | Discharge: 2022-11-18 | DRG: 100 | Disposition: A | Discharge status: Home / Self Care | Payer: BC Managed Care – PPO | Attending: Internal Medicine | Admitting: Internal Medicine

## 2022-11-10 DIAGNOSIS — Z820 Family history of epilepsy and other diseases of the nervous system: Secondary | ICD-10-CM | POA: Diagnosis not present

## 2022-11-10 DIAGNOSIS — I4891 Unspecified atrial fibrillation: Secondary | ICD-10-CM | POA: Diagnosis present

## 2022-11-10 DIAGNOSIS — Z79899 Other long term (current) drug therapy: Secondary | ICD-10-CM | POA: Diagnosis not present

## 2022-11-10 DIAGNOSIS — J440 Chronic obstructive pulmonary disease with acute lower respiratory infection: Secondary | ICD-10-CM | POA: Diagnosis present

## 2022-11-10 DIAGNOSIS — Z043 Encounter for examination and observation following other accident: Secondary | ICD-10-CM | POA: Diagnosis not present

## 2022-11-10 DIAGNOSIS — Z800 Family history of malignant neoplasm of digestive organs: Secondary | ICD-10-CM | POA: Diagnosis not present

## 2022-11-10 DIAGNOSIS — Z8249 Family history of ischemic heart disease and other diseases of the circulatory system: Secondary | ICD-10-CM | POA: Diagnosis not present

## 2022-11-10 DIAGNOSIS — R569 Unspecified convulsions: Secondary | ICD-10-CM

## 2022-11-10 DIAGNOSIS — F1010 Alcohol abuse, uncomplicated: Secondary | ICD-10-CM | POA: Diagnosis not present

## 2022-11-10 DIAGNOSIS — R0689 Other abnormalities of breathing: Secondary | ICD-10-CM | POA: Diagnosis not present

## 2022-11-10 DIAGNOSIS — R7989 Other specified abnormal findings of blood chemistry: Secondary | ICD-10-CM | POA: Diagnosis present

## 2022-11-10 DIAGNOSIS — I509 Heart failure, unspecified: Secondary | ICD-10-CM

## 2022-11-10 DIAGNOSIS — R58 Hemorrhage, not elsewhere classified: Secondary | ICD-10-CM | POA: Diagnosis not present

## 2022-11-10 DIAGNOSIS — E785 Hyperlipidemia, unspecified: Secondary | ICD-10-CM | POA: Diagnosis not present

## 2022-11-10 DIAGNOSIS — D649 Anemia, unspecified: Secondary | ICD-10-CM | POA: Diagnosis not present

## 2022-11-10 DIAGNOSIS — D696 Thrombocytopenia, unspecified: Secondary | ICD-10-CM | POA: Diagnosis present

## 2022-11-10 DIAGNOSIS — G40909 Epilepsy, unspecified, not intractable, without status epilepticus: Secondary | ICD-10-CM | POA: Diagnosis not present

## 2022-11-10 DIAGNOSIS — Z1152 Encounter for screening for COVID-19: Secondary | ICD-10-CM | POA: Diagnosis not present

## 2022-11-10 DIAGNOSIS — M1711 Unilateral primary osteoarthritis, right knee: Secondary | ICD-10-CM | POA: Diagnosis not present

## 2022-11-10 DIAGNOSIS — Z91199 Patient's noncompliance with other medical treatment and regimen due to unspecified reason: Secondary | ICD-10-CM

## 2022-11-10 DIAGNOSIS — J210 Acute bronchiolitis due to respiratory syncytial virus: Secondary | ICD-10-CM | POA: Diagnosis not present

## 2022-11-10 DIAGNOSIS — I110 Hypertensive heart disease with heart failure: Secondary | ICD-10-CM | POA: Diagnosis present

## 2022-11-10 DIAGNOSIS — B338 Other specified viral diseases: Secondary | ICD-10-CM | POA: Diagnosis not present

## 2022-11-10 DIAGNOSIS — E860 Dehydration: Secondary | ICD-10-CM | POA: Diagnosis present

## 2022-11-10 DIAGNOSIS — G9341 Metabolic encephalopathy: Secondary | ICD-10-CM | POA: Diagnosis not present

## 2022-11-10 DIAGNOSIS — I700 Atherosclerosis of aorta: Secondary | ICD-10-CM | POA: Diagnosis not present

## 2022-11-10 DIAGNOSIS — Z5309 Procedure and treatment not carried out because of other contraindication: Secondary | ICD-10-CM | POA: Diagnosis not present

## 2022-11-10 DIAGNOSIS — R945 Abnormal results of liver function studies: Secondary | ICD-10-CM | POA: Diagnosis not present

## 2022-11-10 DIAGNOSIS — I10 Essential (primary) hypertension: Secondary | ICD-10-CM | POA: Diagnosis present

## 2022-11-10 DIAGNOSIS — Z833 Family history of diabetes mellitus: Secondary | ICD-10-CM | POA: Diagnosis not present

## 2022-11-10 DIAGNOSIS — I482 Chronic atrial fibrillation, unspecified: Secondary | ICD-10-CM | POA: Diagnosis not present

## 2022-11-10 DIAGNOSIS — F17200 Nicotine dependence, unspecified, uncomplicated: Secondary | ICD-10-CM | POA: Diagnosis present

## 2022-11-10 DIAGNOSIS — I959 Hypotension, unspecified: Secondary | ICD-10-CM | POA: Diagnosis present

## 2022-11-10 DIAGNOSIS — R55 Syncope and collapse: Secondary | ICD-10-CM | POA: Diagnosis not present

## 2022-11-10 DIAGNOSIS — K760 Fatty (change of) liver, not elsewhere classified: Secondary | ICD-10-CM | POA: Diagnosis present

## 2022-11-10 DIAGNOSIS — J449 Chronic obstructive pulmonary disease, unspecified: Secondary | ICD-10-CM | POA: Diagnosis present

## 2022-11-10 DIAGNOSIS — M47816 Spondylosis without myelopathy or radiculopathy, lumbar region: Secondary | ICD-10-CM | POA: Diagnosis not present

## 2022-11-10 DIAGNOSIS — Z7951 Long term (current) use of inhaled steroids: Secondary | ICD-10-CM

## 2022-11-10 DIAGNOSIS — I5032 Chronic diastolic (congestive) heart failure: Secondary | ICD-10-CM | POA: Diagnosis present

## 2022-11-10 DIAGNOSIS — Z91148 Patient's other noncompliance with medication regimen for other reason: Secondary | ICD-10-CM | POA: Diagnosis not present

## 2022-11-10 DIAGNOSIS — W19XXXA Unspecified fall, initial encounter: Secondary | ICD-10-CM | POA: Diagnosis not present

## 2022-11-10 DIAGNOSIS — F10239 Alcohol dependence with withdrawal, unspecified: Secondary | ICD-10-CM | POA: Diagnosis not present

## 2022-11-10 DIAGNOSIS — I4819 Other persistent atrial fibrillation: Secondary | ICD-10-CM | POA: Diagnosis not present

## 2022-11-10 DIAGNOSIS — R932 Abnormal findings on diagnostic imaging of liver and biliary tract: Secondary | ICD-10-CM | POA: Diagnosis not present

## 2022-11-10 DIAGNOSIS — F17210 Nicotine dependence, cigarettes, uncomplicated: Secondary | ICD-10-CM | POA: Diagnosis present

## 2022-11-10 DIAGNOSIS — M25561 Pain in right knee: Secondary | ICD-10-CM | POA: Diagnosis present

## 2022-11-10 DIAGNOSIS — Z7984 Long term (current) use of oral hypoglycemic drugs: Secondary | ICD-10-CM

## 2022-11-10 DIAGNOSIS — S022XXA Fracture of nasal bones, initial encounter for closed fracture: Secondary | ICD-10-CM | POA: Diagnosis not present

## 2022-11-10 DIAGNOSIS — S0993XA Unspecified injury of face, initial encounter: Secondary | ICD-10-CM | POA: Diagnosis not present

## 2022-11-10 HISTORY — DX: Chronic obstructive pulmonary disease, unspecified: J44.9

## 2022-11-10 LAB — CBC WITH DIFFERENTIAL/PLATELET
Abs Immature Granulocytes: 0.02 10*3/uL (ref 0.00–0.07)
Basophils Absolute: 0 10*3/uL (ref 0.0–0.1)
Basophils Relative: 1 %
Eosinophils Absolute: 0 10*3/uL (ref 0.0–0.5)
Eosinophils Relative: 1 %
HCT: 38 % — ABNORMAL LOW (ref 39.0–52.0)
Hemoglobin: 12.6 g/dL — ABNORMAL LOW (ref 13.0–17.0)
Immature Granulocytes: 1 %
Lymphocytes Relative: 25 %
Lymphs Abs: 1 10*3/uL (ref 0.7–4.0)
MCH: 31.5 pg (ref 26.0–34.0)
MCHC: 33.2 g/dL (ref 30.0–36.0)
MCV: 95 fL (ref 80.0–100.0)
Monocytes Absolute: 0.5 10*3/uL (ref 0.1–1.0)
Monocytes Relative: 12 %
Neutro Abs: 2.5 10*3/uL (ref 1.7–7.7)
Neutrophils Relative %: 60 %
Platelets: 53 10*3/uL — ABNORMAL LOW (ref 150–400)
RBC: 4 MIL/uL — ABNORMAL LOW (ref 4.22–5.81)
RDW: 15.4 % (ref 11.5–15.5)
WBC: 4.1 10*3/uL (ref 4.0–10.5)
nRBC: 0.5 % — ABNORMAL HIGH (ref 0.0–0.2)

## 2022-11-10 LAB — COMPREHENSIVE METABOLIC PANEL
ALT: 48 U/L — ABNORMAL HIGH (ref 0–44)
AST: 93 U/L — ABNORMAL HIGH (ref 15–41)
Albumin: 3.1 g/dL — ABNORMAL LOW (ref 3.5–5.0)
Alkaline Phosphatase: 103 U/L (ref 38–126)
Anion gap: 14 (ref 5–15)
BUN: 26 mg/dL — ABNORMAL HIGH (ref 6–20)
CO2: 22 mmol/L (ref 22–32)
Calcium: 8.2 mg/dL — ABNORMAL LOW (ref 8.9–10.3)
Chloride: 101 mmol/L (ref 98–111)
Creatinine, Ser: 0.73 mg/dL (ref 0.61–1.24)
GFR, Estimated: >60 mL/min (ref >60)
Glucose, Bld: 107 mg/dL — ABNORMAL HIGH (ref 70–99)
Potassium: 4.2 mmol/L (ref 3.5–5.1)
Sodium: 137 mmol/L (ref 135–145)
Total Bilirubin: 1 mg/dL (ref 0.3–1.2)
Total Protein: 5.8 g/dL — ABNORMAL LOW (ref 6.5–8.1)

## 2022-11-10 LAB — RAPID URINE DRUG SCREEN, HOSP PERFORMED
Amphetamines: NOT DETECTED
Barbiturates: NOT DETECTED
Benzodiazepines: NOT DETECTED
Cocaine: NOT DETECTED
Opiates: NOT DETECTED
Tetrahydrocannabinol: NOT DETECTED

## 2022-11-10 LAB — ETHANOL: Alcohol, Ethyl (B): <10 mg/dL (ref <10)

## 2022-11-10 LAB — CARBAMAZEPINE LEVEL, TOTAL: Carbamazepine Lvl: <2 ug/mL — ABNORMAL LOW (ref 4.0–12.0)

## 2022-11-10 LAB — CBG MONITORING, ED: Glucose-Capillary: 103 mg/dL — ABNORMAL HIGH (ref 70–99)

## 2022-11-10 MED ORDER — SODIUM CHLORIDE 0.9 % IV BOLUS
1000.0000 mL | Freq: Once | INTRAVENOUS | Status: AC
  Administered 2022-11-10: 1000 mL via INTRAVENOUS

## 2022-11-10 MED ORDER — ACETAMINOPHEN 500 MG PO TABS
1000.0000 mg | ORAL_TABLET | Freq: Once | ORAL | Status: AC
  Administered 2022-11-10: 1000 mg via ORAL
  Filled 2022-11-10: qty 2

## 2022-11-10 MED ORDER — SODIUM CHLORIDE 0.9 % IV BOLUS
500.0000 mL | Freq: Once | INTRAVENOUS | Status: AC
  Administered 2022-11-10: 500 mL via INTRAVENOUS

## 2022-11-10 MED ORDER — DILTIAZEM HCL 25 MG/5ML IV SOLN
15.0000 mg | Freq: Once | INTRAVENOUS | Status: AC
  Administered 2022-11-10: 15 mg via INTRAVENOUS
  Filled 2022-11-10: qty 5

## 2022-11-10 MED ORDER — LORAZEPAM 1 MG PO TABS
1.0000 mg | ORAL_TABLET | Freq: Once | ORAL | Status: AC
  Administered 2022-11-10: 1 mg via ORAL
  Filled 2022-11-10: qty 1

## 2022-11-10 MED ORDER — THIAMINE HCL 100 MG/ML IJ SOLN
100.0000 mg | Freq: Once | INTRAMUSCULAR | Status: AC
  Administered 2022-11-10: 100 mg via INTRAVENOUS
  Filled 2022-11-10: qty 2

## 2022-11-10 MED ORDER — LORAZEPAM 2 MG/ML IJ SOLN: 1.0000 mg | Freq: Once | INTRAMUSCULAR | Status: DC

## 2022-11-10 MED ORDER — LORAZEPAM 1 MG PO TABS
2.0000 mg | ORAL_TABLET | Freq: Once | ORAL | Status: AC
  Administered 2022-11-10: 2 mg via ORAL
  Filled 2022-11-10: qty 2

## 2022-11-10 MED ORDER — FOLIC ACID 1 MG PO TABS
1.0000 mg | ORAL_TABLET | Freq: Once | ORAL | Status: AC
  Administered 2022-11-10: 1 mg via ORAL
  Filled 2022-11-10: qty 1

## 2022-11-10 MED ORDER — HEPARIN BOLUS VIA INFUSION: 60.0000 [IU]/kg | Freq: Once | INTRAVENOUS | Status: DC

## 2022-11-10 MED ORDER — HEPARIN (PORCINE) 25000 UT/250ML-% IV SOLN
1400.0000 [IU]/h | INTRAVENOUS | Status: DC
  Administered 2022-11-10: 1200 [IU]/h via INTRAVENOUS
  Filled 2022-11-10: qty 250

## 2022-11-10 MED ORDER — CARBAMAZEPINE 200 MG PO TABS
200.0000 mg | ORAL_TABLET | Freq: Once | ORAL | Status: AC
  Administered 2022-11-11: 200 mg via ORAL
  Filled 2022-11-10 (×2): qty 1

## 2022-11-10 NOTE — Subjective & Objective (Signed)
Possible Alcohol withdrawal  Had a birthday drank a lot not for the past few days  Was found down having seizure like activity  He may have hx of seizures  On tegretol unclear why   Initial BP 60/40 per EMS was given IV fluids Vitals now imporved  Noted to be in A.fib started on diltiazem drip Received a few doses of Ativan now does not provide hx

## 2022-11-10 NOTE — Progress Notes (Signed)
ANTICOAGULATION CONSULT NOTE - Initial Consult  Pharmacy Consult for heparin  Indication: atrial fibrillation  No Known Allergies  Patient Measurements: Weight: 95 kg (209 lb 7 oz) Heparin Dosing Weight: 93.4kg   Vital Signs: Temp: 98.2 F (36.8 C) (01/04 1903) Temp Source: Oral (01/04 1903) BP: 131/90 (01/04 2130) Pulse Rate: 86 (01/04 2130)  Labs: Recent Labs    11/10/22 1749  HGB 12.6*  HCT 38.0*  PLT 53*  CREATININE 0.73    Estimated Creatinine Clearance: 124.6 mL/min (by C-G formula based on SCr of 0.73 mg/dL).   Medical History: Past Medical History:  Diagnosis Date   Alcohol abuse    Cervicalgia    CHF (congestive heart failure) (HCC)    Hyperlipidemia    Hypertension    Seizures ([place])    Assessment: Patient admitted post-ictal. Patient found to have afib. Not on anticoagulation PTA. CBC notable for PLTS of 53. Pharmacy consulted to dose heparin.   Goal of Therapy:  Heparin level 0.3-0.7 units/ml Monitor platelets by anticoagulation protocol: Yes   Plan:  Will admit bolus given significant thrombocytopenia.  Start heparin infusion at 1200 units/hr, being cautious in setting of thrombocytopenia.  Check anti-Xa level in 6 hours and daily while on heparin Continue to monitor H&H and platelets  Esmeralda Arthur, PharmD, BCCCP  11/10/2022,10:13 PM

## 2022-11-10 NOTE — ED Triage Notes (Signed)
Patient with history of seizures BIB [facility] for evaluation after a seizure. Patient post ictal with EMS, progressively becoming more oriented en route. BP reported 60/40 with EMS. Patient alert, oriented, and in no apparent distress at this time. BP 120/85.

## 2022-11-10 NOTE — ED Notes (Signed)
Pt hr elevated 120-140 PA [place] advised, ativan ordered and [place] completed

## 2022-11-10 NOTE — ED Provider Notes (Signed)
[facility] EMERGENCY DEPARTMENT Provider Note   CSN: [id number] Arrival date & time: 11/10/22  1638     History  Chief Complaint  Patient presents with   Seizures    Brailon [name]. is a 52 y.o. male.  The history is provided by the patient, the EMS personnel and medical records. No language interpreter was used.  Seizures    52 year old male with significant history of alcohol abuse, alcohol-related seizure, hypertension, CHF, hyperlipidemia, tobacco use, brought here via EMS with concerns of seizures.  EMS report patient was found facedown at his work site with an apparent seizure episode.  When EMS arrived, patient was postictal but at this time he became more alert.  EMS noted that his blood pressure initially was 70 systolic, patient received 500 cc of normal saline with improvement of blood pressure however once arrived to the ER, EMS noted that his blood pressure dropped down to 60/40.  Patient overall does not have any significant complaint.  He believes he may have had a seizure episode.  He does not complain of any headache, neck pain, chest pain, trouble breathing, abdominal pain but does endorse some lower back pain.  He denies any numbness weakness nausea vomiting tongue biting or bowel bladder incontinence.  Last alcohol use was yesterday and drink a pint of liquor.  He normally on home oxygen at 2 L.  He denies any recent medication changes.  Denies any SI or HI.  Denies any alcohol use today.  Home Medications Prior to Admission medications   Medication Sig Start Date End Date Taking? Authorizing Provider  albuterol (VENTOLIN HFA) 108 (90 Base) MCG/ACT inhaler Inhale 2 puffs into the lungs every 6 (six) hours as needed for wheezing or shortness of breath. 06/27/22   Lucillie Garfinkel, MD  amLODipine (NORVASC) 10 MG tablet Take 1 tablet (10 mg total) by mouth daily. 04/18/22   Jon Billings, NP  carbamazepine (TEGRETOL) 200 MG tablet Take 1 tablet (200 mg  total) by mouth 2 (two) times daily. 08/24/22   Jon Billings, NP  carvedilol (COREG) 25 MG tablet Take 1 tablet (25 mg total) by mouth 2 (two) times daily with a meal. 04/18/22   Jon Billings, NP  empagliflozin (JARDIANCE) 10 MG TABS tablet Take 1 tablet (10 mg total) by mouth daily before breakfast. 05/26/22   Jon Billings, NP  Fluticasone-Umeclidin-Vilant (TRELEGY ELLIPTA) 100-62.5-25 MCG/ACT AEPB Inhale 1 puff into the lungs daily. 08/24/22   Jon Billings, NP  furosemide (LASIX) 40 MG tablet Take 1 tablet (40 mg total) by mouth daily. 10/04/22   Alisa Graff, FNP  hydrALAZINE (APRESOLINE) 50 MG tablet Take 1 tablet (50 mg total) by mouth 3 (three) times daily. 04/18/22   Jon Billings, NP  Iron, Ferrous Sulfate, 325 (65 Fe) MG TABS Take 325 mg by mouth daily. 08/24/22   Jon Billings, NP  isosorbide mononitrate (IMDUR) 30 MG 24 hr tablet Take 1 tablet (30 mg total) by mouth daily. Patient not taking: Reported on 10/20/2022 08/24/22   Jon Billings, NP  potassium chloride (MICRO-K) 10 MEQ CR capsule Take 1 capsule (10 mEq total) by mouth daily. 10/04/22   Alisa Graff, FNP  rosuvastatin (CRESTOR) 20 MG tablet Take 1 tablet (20 mg total) by mouth daily. 08/24/22   Jon Billings, NP  sacubitril-valsartan (ENTRESTO) 49-51 MG Take 1 tablet by mouth 2 (two) times daily. 10/20/22   Alisa Graff, FNP      Allergies    Patient  has no known allergies.    Review of Systems   Review of Systems  Neurological:  Positive for seizures.  All other systems reviewed and are negative.   Physical Exam Updated Vital Signs BP 120/87   Pulse (!) 133   Temp 98.2 F (36.8 C) (Oral)   Resp (!) 21   SpO2 96%  Physical Exam Vitals and nursing note reviewed.  Constitutional:      General: He is not in acute distress.    Appearance: He is well-developed.     Comments: Patient laying bed appears to be in no acute discomfort.  HENT:     Head: Normocephalic and  atraumatic.     Mouth/Throat:     Mouth: Mucous membranes are moist.     Comments: No tongue injury Eyes:     Comments: Small right subconjunctival hemorrhage.  Extraocular movements intact pupils equal round reactive to light bilaterally.  Cardiovascular:     Rate and Rhythm: Rhythm irregular.     Pulses: Normal pulses.     Heart sounds: Normal heart sounds.  Pulmonary:     Effort: Pulmonary effort is normal.     Breath sounds: Normal breath sounds. No wheezing, rhonchi or rales.  Abdominal:     Palpations: Abdomen is soft.     Tenderness: There is no abdominal tenderness.  Musculoskeletal:        General: Tenderness (Mild tenderness to lumbar spine and paralumbar spinal muscle without crepitus or step-off.) present.     Cervical back: Neck supple.     Comments: Able to move all 4 extremities with equal strength.  Skin:    Findings: No rash.  Neurological:     Mental Status: He is alert and oriented to person, place, and time.  Psychiatric:        Mood and Affect: Mood normal.     ED Results / Procedures / Treatments   Labs (all labs ordered are listed, but only abnormal results are displayed) Labs Reviewed  CBC WITH DIFFERENTIAL/PLATELET - Abnormal; Notable for the following components:      Result Value   RBC 4.00 (*)    Hemoglobin 12.6 (*)    HCT 38.0 (*)    Platelets 53 (*)    nRBC 0.5 (*)    All other components within normal limits  COMPREHENSIVE METABOLIC PANEL - Abnormal; Notable for the following components:   Glucose, Bld 107 (*)    BUN 26 (*)    Calcium 8.2 (*)    Total Protein 5.8 (*)    Albumin 3.1 (*)    AST 93 (*)    ALT 48 (*)    All other components within normal limits  CBG MONITORING, ED - Abnormal; Notable for the following components:   Glucose-Capillary 103 (*)    All other components within normal limits  ETHANOL  RAPID URINE DRUG SCREEN, HOSP PERFORMED  CARBAMAZEPINE LEVEL, TOTAL    EKG EKG Interpretation  Date/Time:  Thursday  November 10 2022 16:46:22 EST Ventricular Rate:  113 PR Interval:    QRS Duration: 150 QT Interval:  344 QTC Calculation: 472 R Axis:   108 Text Interpretation: Atrial fibrillation IVCD, consider atypical RBBB Abnormal lateral Q waves Probable anteroseptal infarct, old atrial fib new since previous tracing, wandering baseline but no apparent  acute ischemic changes Confirmed by Charlesetta Shanks [phone]) on 11/10/2022 9:40:43 PM  Radiology DG Lumbar Spine Complete  Result Date: 11/10/2022 CLINICAL DATA:  Fall. EXAM: LUMBAR SPINE - COMPLETE 4+  VIEW COMPARISON:  None Available. FINDINGS: No fracture or traumatic malalignment. Multilevel degenerative disc disease, most prominent in the lower lumbar spine. Lower lumbar facet degenerative changes. No other bony or soft tissue abnormalities identified. IMPRESSION: No fracture or traumatic malalignment. Degenerative changes as above. Electronically Signed   By: Dorise Bullion III M.D.   On: 11/10/2022 17:51   CT Head Wo Contrast  Result Date: 11/10/2022 CLINICAL DATA:  Seizures, new onset.  Head and facial trauma. EXAM: CT HEAD WITHOUT CONTRAST CT MAXILLOFACIAL WITHOUT CONTRAST TECHNIQUE: Multidetector CT imaging of the head and maxillofacial structures were performed using the standard protocol without intravenous contrast. Multiplanar CT image reconstructions of the maxillofacial structures were also generated. RADIATION DOSE REDUCTION: This exam was performed according to the departmental dose-optimization program which includes automated exposure control, adjustment of the mA and/or kV according to patient size and/or use of iterative reconstruction technique. COMPARISON:  None Available. FINDINGS: CT HEAD FINDINGS Brain: No evidence of acute infarction, hemorrhage, hydrocephalus, extra-axial collection or mass lesion/mass effect. Mild generalized cerebral atrophy. Patchy area of low-attenuation of the periventricular white matter presumed chronic  microvascular ischemic changes. Vascular: No hyperdense vessel or unexpected calcification. Skull: Normal. Negative for fracture or focal lesion. Other: None. CT MAXILLOFACIAL FINDINGS Osseous: No fracture or mandibular dislocation. No destructive process. Orbits: Negative. No traumatic or inflammatory finding. Sinuses: Marked mucosal thickening of the bilateral maxillary sinuses with partial opacification. Mucosal thickening of the ethmoid air cells. Remaining paranasal sinuses and mastoid air cells appear clear. Soft tissues: No significant hematoma or fluid collection. IMPRESSION: CT head: 1. No acute intracranial abnormality. 2. Mild generalized cerebral atrophy and chronic microvascular ischemic changes of the white matter. CT maxillofacial: 1. No acute facial bone fracture. 2. Bilateral maxillary and ethmoid sinus disease. Electronically Signed   By: Keane Police D.O.   On: 11/10/2022 17:33   CT Maxillofacial Wo Contrast  Result Date: 11/10/2022 CLINICAL DATA:  Seizures, new onset.  Head and facial trauma. EXAM: CT HEAD WITHOUT CONTRAST CT MAXILLOFACIAL WITHOUT CONTRAST TECHNIQUE: Multidetector CT imaging of the head and maxillofacial structures were performed using the standard protocol without intravenous contrast. Multiplanar CT image reconstructions of the maxillofacial structures were also generated. RADIATION DOSE REDUCTION: This exam was performed according to the departmental dose-optimization program which includes automated exposure control, adjustment of the mA and/or kV according to patient size and/or use of iterative reconstruction technique. COMPARISON:  None Available. FINDINGS: CT HEAD FINDINGS Brain: No evidence of acute infarction, hemorrhage, hydrocephalus, extra-axial collection or mass lesion/mass effect. Mild generalized cerebral atrophy. Patchy area of low-attenuation of the periventricular white matter presumed chronic microvascular ischemic changes. Vascular: No hyperdense vessel  or unexpected calcification. Skull: Normal. Negative for fracture or focal lesion. Other: None. CT MAXILLOFACIAL FINDINGS Osseous: No fracture or mandibular dislocation. No destructive process. Orbits: Negative. No traumatic or inflammatory finding. Sinuses: Marked mucosal thickening of the bilateral maxillary sinuses with partial opacification. Mucosal thickening of the ethmoid air cells. Remaining paranasal sinuses and mastoid air cells appear clear. Soft tissues: No significant hematoma or fluid collection. IMPRESSION: CT head: 1. No acute intracranial abnormality. 2. Mild generalized cerebral atrophy and chronic microvascular ischemic changes of the white matter. CT maxillofacial: 1. No acute facial bone fracture. 2. Bilateral maxillary and ethmoid sinus disease. Electronically Signed   By: Keane Police D.O.   On: 11/10/2022 17:33    Procedures .Critical Care  Performed by: Domenic Moras, PA-C Authorized by: Domenic Moras, PA-C   Critical care provider statement:  Critical care time (minutes):  30   Critical care was time spent personally by me on the following activities:  Development of treatment plan with patient or surrogate, discussions with consultants, evaluation of patient's response to treatment, examination of patient, ordering and review of laboratory studies, ordering and review of radiographic studies, ordering and performing treatments and interventions, pulse oximetry, re-evaluation of patient's condition and review of old charts     Medications Ordered in ED Medications  carbamazepine (TEGRETOL) tablet 200 mg (has no administration in time range)  sodium chloride 0.9 % bolus 1,000 mL (has no administration in time range)  heparin ADULT infusion 100 units/mL (25000 units/263m) (1,200 Units/hr Intravenous New Bag/Given 11/10/22 2239)  LORazepam (ATIVAN) tablet 1 mg (1 mg Oral Given 11/10/22 1752)  sodium chloride 0.9 % bolus 500 mL (0 mLs Intravenous Stopped 11/10/22 2206)   acetaminophen (TYLENOL) tablet 1,000 mg (1,000 mg Oral Given 11/10/22 2038)  LORazepam (ATIVAN) tablet 2 mg (2 mg Oral Given 11/10/22 2046)  diltiazem (CARDIZEM) injection 15 mg (15 mg Intravenous Given 11/10/22 2230)  thiamine (VITAMIN B1) injection 100 mg (100 mg Intravenous Given 17/7/8224235  folic acid (FOLVITE) tablet 1 mg (1 mg Oral Given 11/10/22 2230)    ED Course/ Medical Decision Making/ A&P                           Medical Decision Making Amount and/or Complexity of Data Reviewed Labs: ordered. Radiology: ordered.   BP 120/87   Pulse (!) 133   Temp 98.2 F (36.8 C) (Oral)   Resp (!) 21   SpO2 96%   [id number] 52year old male with significant history of alcohol abuse, alcohol-related seizure, hypertension, CHF, hyperlipidemia, tobacco use, brought here via EMS with concerns of seizures.  EMS report patient was found facedown at his work site with an apparent seizure episode.  When EMS arrived, patient was postictal but at this time he became more alert.  EMS noted that his blood pressure initially was 70 systolic, patient received 500 cc of normal saline with improvement of blood pressure however once arrived to the ER, EMS noted that his blood pressure dropped down to 60/40.  Patient overall does not have any significant complaint.  He believes he may have had a seizure episode.  He does not complain of any headache, neck pain, chest pain, trouble breathing, abdominal pain but does endorse some lower back pain.  He denies any numbness weakness nausea vomiting tongue biting or bowel bladder incontinence.  Last alcohol use was yesterday and drink a pint of liquor.  He normally on home oxygen at 2 L.  He denies any recent medication changes.  Denies any SI or HI.  Denies any alcohol use today.  On exam, patient is laying in bed appears slightly drowsy but overall not in any acute discomfort.  Scalp exam unremarkable, does have a faint subconjunctival hemorrhage noted to right eye without  any tenderness.  No midface tenderness but he does have some abrasion noted to his forehead and nose.  Neck exam unremarkable, no tenderness to chest abdomen pelvis he is able to move all 4 extremities he is alert and oriented x 4.  He is able to answer question appropriately.  -Labs ordered, independently viewed and interpreted by me.  Labs remarkable for Plt of 53, which is new.  No petechiae on exam.  Mild transaminitis with AST 93, ALT 48. Likely 2/2 chronic alcohol use. ETOH undetectable  -  The patient was maintained on a cardiac monitor.  I personally viewed and interpreted the cardiac monitored which showed an underlying rhythm of: afib with RVR. No prior hx of afib.  Suspect afib in the setting of chronic alcohol use and CHF.  Pt will benefit from a DOAC, however he is at risk of bleeding due to chronic alcohol use.  -Imaging independently viewed and interpreted by me and I agree with radiologist's interpretation.  Result remarkable for head and maxillofacial CT without acute changes.  LSPINE xray unremarkable -This patient presents to the ED for concern of seizure, this involves an extensive number of treatment options, and is a complaint that carries with it a high risk of complications and morbidity.  The differential diagnosis includes seizure, syncope, stroke, acs, anemia, hypovolemia, alcohol intoxication -Co morbidities that complicate the patient evaluation includes alcohol use, seizure -Treatment includes ativan -Reevaluation of the patient after these medicines showed that the patient improved -PCP office notes or outside notes reviewed -Discussion with specialist attending Dr. Johnney Killian.  -Escalation to admission/observation considered: patients feels much better, is comfortable with discharge, and will follow up with PCP  10:05 PM Patient with known history of seizure likely in the setting of alcohol withdrawal.  He does have Tegretol on his list of medication.  He had an apparent  seizure activity at work and was postictal when EMS arrived.  He reports last alcohol use was yesterday.  His alcohol is undetectable currently.  He was noted to have new onset of A-fib with RVR.  Does have history of CHF with an EF of 60-65%  on last echo in 2022.  Patient reportedly had soft blood pressure by EMS as low as 60/40.  On blood pressure is fairly normal during this visit.  Will provide additional IV fluid.  Patient still in A-fib with RVR, will start heparin, as well as provide Cardizem for rate control.  Patient received multiple dose of Ativan during his ER visit.  His labs also remarkable for thrombocytopenia with platelets of 59.  No evidence of spontaneous bleeding with exception of right subconjunctival hemorrhage.  11:48 PM Pt sign out to oncoming team who will consult medicine for admission.         Final Clinical Impression(s) / ED Diagnoses Final diagnoses:  Seizure [facility])  New onset atrial fibrillation ([place])  Thrombocytopenia [facility])    Rx / DC Orders ED Discharge Orders     None         Domenic Moras, PA-C 11/10/22 2350    Charlesetta Shanks, MD 11/16/22 1712

## 2022-11-11 ENCOUNTER — Observation Stay (HOSPITAL_COMMUNITY): Payer: BC Managed Care – PPO

## 2022-11-11 ENCOUNTER — Other Ambulatory Visit: Payer: Self-pay

## 2022-11-11 ENCOUNTER — Encounter (HOSPITAL_COMMUNITY): Payer: Self-pay | Admitting: Internal Medicine

## 2022-11-11 ENCOUNTER — Emergency Department (HOSPITAL_COMMUNITY): Payer: BC Managed Care – PPO

## 2022-11-11 DIAGNOSIS — J449 Chronic obstructive pulmonary disease, unspecified: Secondary | ICD-10-CM

## 2022-11-11 DIAGNOSIS — I10 Essential (primary) hypertension: Secondary | ICD-10-CM

## 2022-11-11 DIAGNOSIS — I4891 Unspecified atrial fibrillation: Secondary | ICD-10-CM

## 2022-11-11 DIAGNOSIS — Z91148 Patient's other noncompliance with medication regimen for other reason: Secondary | ICD-10-CM | POA: Diagnosis not present

## 2022-11-11 DIAGNOSIS — Z820 Family history of epilepsy and other diseases of the nervous system: Secondary | ICD-10-CM | POA: Diagnosis not present

## 2022-11-11 DIAGNOSIS — B338 Other specified viral diseases: Secondary | ICD-10-CM | POA: Diagnosis not present

## 2022-11-11 DIAGNOSIS — R569 Unspecified convulsions: Secondary | ICD-10-CM

## 2022-11-11 DIAGNOSIS — Z1152 Encounter for screening for COVID-19: Secondary | ICD-10-CM | POA: Diagnosis not present

## 2022-11-11 DIAGNOSIS — E860 Dehydration: Secondary | ICD-10-CM | POA: Diagnosis present

## 2022-11-11 DIAGNOSIS — F10239 Alcohol dependence with withdrawal, unspecified: Secondary | ICD-10-CM | POA: Diagnosis present

## 2022-11-11 DIAGNOSIS — Z79899 Other long term (current) drug therapy: Secondary | ICD-10-CM | POA: Diagnosis not present

## 2022-11-11 DIAGNOSIS — I110 Hypertensive heart disease with heart failure: Secondary | ICD-10-CM | POA: Diagnosis present

## 2022-11-11 DIAGNOSIS — Z800 Family history of malignant neoplasm of digestive organs: Secondary | ICD-10-CM | POA: Diagnosis not present

## 2022-11-11 DIAGNOSIS — I4819 Other persistent atrial fibrillation: Secondary | ICD-10-CM | POA: Diagnosis not present

## 2022-11-11 DIAGNOSIS — Z833 Family history of diabetes mellitus: Secondary | ICD-10-CM | POA: Diagnosis not present

## 2022-11-11 DIAGNOSIS — R7989 Other specified abnormal findings of blood chemistry: Secondary | ICD-10-CM

## 2022-11-11 DIAGNOSIS — R55 Syncope and collapse: Secondary | ICD-10-CM | POA: Diagnosis not present

## 2022-11-11 DIAGNOSIS — R945 Abnormal results of liver function studies: Secondary | ICD-10-CM | POA: Diagnosis not present

## 2022-11-11 DIAGNOSIS — I700 Atherosclerosis of aorta: Secondary | ICD-10-CM | POA: Diagnosis not present

## 2022-11-11 DIAGNOSIS — F1010 Alcohol abuse, uncomplicated: Secondary | ICD-10-CM

## 2022-11-11 DIAGNOSIS — I5032 Chronic diastolic (congestive) heart failure: Secondary | ICD-10-CM | POA: Diagnosis present

## 2022-11-11 DIAGNOSIS — I959 Hypotension, unspecified: Secondary | ICD-10-CM | POA: Diagnosis present

## 2022-11-11 DIAGNOSIS — G40909 Epilepsy, unspecified, not intractable, without status epilepticus: Principal | ICD-10-CM | POA: Insufficient documentation

## 2022-11-11 DIAGNOSIS — D649 Anemia, unspecified: Secondary | ICD-10-CM | POA: Diagnosis present

## 2022-11-11 DIAGNOSIS — J210 Acute bronchiolitis due to respiratory syncytial virus: Secondary | ICD-10-CM | POA: Diagnosis present

## 2022-11-11 DIAGNOSIS — D696 Thrombocytopenia, unspecified: Secondary | ICD-10-CM | POA: Diagnosis not present

## 2022-11-11 DIAGNOSIS — E785 Hyperlipidemia, unspecified: Secondary | ICD-10-CM | POA: Diagnosis present

## 2022-11-11 DIAGNOSIS — I482 Chronic atrial fibrillation, unspecified: Secondary | ICD-10-CM | POA: Diagnosis not present

## 2022-11-11 DIAGNOSIS — Z8249 Family history of ischemic heart disease and other diseases of the circulatory system: Secondary | ICD-10-CM | POA: Diagnosis not present

## 2022-11-11 DIAGNOSIS — S022XXA Fracture of nasal bones, initial encounter for closed fracture: Secondary | ICD-10-CM | POA: Diagnosis not present

## 2022-11-11 DIAGNOSIS — G9341 Metabolic encephalopathy: Secondary | ICD-10-CM | POA: Insufficient documentation

## 2022-11-11 DIAGNOSIS — J440 Chronic obstructive pulmonary disease with acute lower respiratory infection: Secondary | ICD-10-CM | POA: Diagnosis present

## 2022-11-11 DIAGNOSIS — F17210 Nicotine dependence, cigarettes, uncomplicated: Secondary | ICD-10-CM | POA: Diagnosis present

## 2022-11-11 DIAGNOSIS — R932 Abnormal findings on diagnostic imaging of liver and biliary tract: Secondary | ICD-10-CM | POA: Diagnosis not present

## 2022-11-11 DIAGNOSIS — Z5309 Procedure and treatment not carried out because of other contraindication: Secondary | ICD-10-CM | POA: Diagnosis not present

## 2022-11-11 DIAGNOSIS — K760 Fatty (change of) liver, not elsewhere classified: Secondary | ICD-10-CM | POA: Diagnosis not present

## 2022-11-11 DIAGNOSIS — M1711 Unilateral primary osteoarthritis, right knee: Secondary | ICD-10-CM | POA: Diagnosis not present

## 2022-11-11 DIAGNOSIS — Z91199 Patient's noncompliance with other medical treatment and regimen due to unspecified reason: Secondary | ICD-10-CM | POA: Diagnosis not present

## 2022-11-11 DIAGNOSIS — M25561 Pain in right knee: Secondary | ICD-10-CM

## 2022-11-11 LAB — I-STAT VENOUS BLOOD GAS, ED
Acid-base deficit: 2 mmol/L (ref 0.0–2.0)
Bicarbonate: 23.1 mmol/L (ref 20.0–28.0)
Calcium, Ion: 1.07 mmol/L — ABNORMAL LOW (ref 1.15–1.40)
HCT: 38 % — ABNORMAL LOW (ref 39.0–52.0)
Hemoglobin: 12.9 g/dL — ABNORMAL LOW (ref 13.0–17.0)
O2 Saturation: 99 %
Potassium: 3.6 mmol/L (ref 3.5–5.1)
Sodium: 138 mmol/L (ref 135–145)
TCO2: 24 mmol/L (ref 22–32)
pCO2, Ven: 41.2 mmHg — ABNORMAL LOW (ref 44–60)
pH, Ven: 7.357 (ref 7.25–7.43)
pO2, Ven: 127 mmHg — ABNORMAL HIGH (ref 32–45)

## 2022-11-11 LAB — CBC
HCT: 34.1 % — ABNORMAL LOW (ref 39.0–52.0)
HCT: 33.6 % — ABNORMAL LOW (ref 39.0–52.0)
Hemoglobin: 11.7 g/dL — ABNORMAL LOW (ref 13.0–17.0)
Hemoglobin: 11.6 g/dL — ABNORMAL LOW (ref 13.0–17.0)
MCH: 32 pg (ref 26.0–34.0)
MCH: 32.2 pg (ref 26.0–34.0)
MCHC: 34.3 g/dL (ref 30.0–36.0)
MCHC: 34.5 g/dL (ref 30.0–36.0)
MCV: 93.2 fL (ref 80.0–100.0)
MCV: 93.3 fL (ref 80.0–100.0)
Platelets: 52 10*3/uL — ABNORMAL LOW (ref 150–400)
Platelets: 49 10*3/uL — ABNORMAL LOW (ref 150–400)
RBC: 3.66 MIL/uL — ABNORMAL LOW (ref 4.22–5.81)
RBC: 3.6 MIL/uL — ABNORMAL LOW (ref 4.22–5.81)
RDW: 15.4 % (ref 11.5–15.5)
RDW: 15.5 % (ref 11.5–15.5)
WBC: 4.5 10*3/uL (ref 4.0–10.5)
WBC: 4.2 10*3/uL (ref 4.0–10.5)
nRBC: 0 % (ref 0.0–0.2)
nRBC: 0 % (ref 0.0–0.2)

## 2022-11-11 LAB — HEPARIN LEVEL (UNFRACTIONATED)
Heparin Unfractionated: <0.1 IU/mL — ABNORMAL LOW (ref 0.30–0.70)
Heparin Unfractionated: <0.1 IU/mL — ABNORMAL LOW (ref 0.30–0.70)

## 2022-11-11 LAB — HEPATITIS PANEL, ACUTE
HCV Ab: NONREACTIVE
Hep A IgM: NONREACTIVE
Hep B C IgM: NONREACTIVE
Hepatitis B Surface Ag: NONREACTIVE

## 2022-11-11 LAB — BLOOD GAS, VENOUS
Acid-Base Excess: 0 mmol/L (ref 0.0–2.0)
Bicarbonate: 25.4 mmol/L (ref 20.0–28.0)
O2 Saturation: 90.5 %
Patient temperature: 37
pCO2, Ven: 43 mmHg — ABNORMAL LOW (ref 44–60)
pH, Ven: 7.38 (ref 7.25–7.43)
pO2, Ven: 61 mmHg — ABNORMAL HIGH (ref 32–45)

## 2022-11-11 LAB — BASIC METABOLIC PANEL
Anion gap: 9 (ref 5–15)
BUN: 18 mg/dL (ref 6–20)
CO2: 24 mmol/L (ref 22–32)
Calcium: 7.6 mg/dL — ABNORMAL LOW (ref 8.9–10.3)
Chloride: 103 mmol/L (ref 98–111)
Creatinine, Ser: 0.69 mg/dL (ref 0.61–1.24)
GFR, Estimated: >60 mL/min (ref >60)
Glucose, Bld: 81 mg/dL (ref 70–99)
Potassium: 3.8 mmol/L (ref 3.5–5.1)
Sodium: 136 mmol/L (ref 135–145)

## 2022-11-11 LAB — URINALYSIS, COMPLETE (UACMP) WITH MICROSCOPIC
Bacteria, UA: NONE SEEN
Bilirubin Urine: NEGATIVE
Glucose, UA: NEGATIVE mg/dL
Hgb urine dipstick: NEGATIVE
Ketones, ur: NEGATIVE mg/dL
Leukocytes,Ua: NEGATIVE
Nitrite: NEGATIVE
Protein, ur: 30 mg/dL — AB
Specific Gravity, Urine: 1.015 (ref 1.005–1.030)
pH: 6 (ref 5.0–8.0)

## 2022-11-11 LAB — LACTIC ACID, PLASMA
Lactic Acid, Venous: 0.9 mmol/L (ref 0.5–1.9)
Lactic Acid, Venous: 0.8 mmol/L (ref 0.5–1.9)

## 2022-11-11 LAB — OSMOLALITY, URINE: Osmolality, Ur: 590 mOsm/kg (ref 300–900)

## 2022-11-11 LAB — TYPE AND SCREEN
ABO/RH(D): O POS
Antibody Screen: NEGATIVE

## 2022-11-11 LAB — ECHOCARDIOGRAM COMPLETE
Calc EF: 45.6 %
Height: 70.5 in
P 1/2 time: 388 msec
S' Lateral: 3.5 cm
Single Plane A2C EF: 41 %
Single Plane A4C EF: 48.8 %
Weight: 3350.99 oz

## 2022-11-11 LAB — RESP PANEL BY RT-PCR (RSV, FLU A&B, COVID)  RVPGX2
Influenza A by PCR: NEGATIVE
Influenza B by PCR: NEGATIVE
Resp Syncytial Virus by PCR: POSITIVE — AB
SARS Coronavirus 2 by RT PCR: NEGATIVE

## 2022-11-11 LAB — PREALBUMIN: Prealbumin: 21 mg/dL (ref 18–38)

## 2022-11-11 LAB — BRAIN NATRIURETIC PEPTIDE: B Natriuretic Peptide: 524.6 pg/mL — ABNORMAL HIGH (ref 0.0–100.0)

## 2022-11-11 LAB — MAGNESIUM: Magnesium: 1 mg/dL — ABNORMAL LOW (ref 1.7–2.4)

## 2022-11-11 LAB — TROPONIN I (HIGH SENSITIVITY)
Troponin I (High Sensitivity): 8 ng/L (ref <18)
Troponin I (High Sensitivity): 8 ng/L (ref <18)

## 2022-11-11 LAB — OSMOLALITY: Osmolality: 284 mOsm/kg (ref 275–295)

## 2022-11-11 LAB — AMMONIA: Ammonia: 39 umol/L — ABNORMAL HIGH (ref 9–35)

## 2022-11-11 LAB — CREATININE, URINE, RANDOM: Creatinine, Urine: 80 mg/dL

## 2022-11-11 LAB — TSH: TSH: 1.436 u[IU]/mL (ref 0.350–4.500)

## 2022-11-11 LAB — PROTIME-INR
INR: 1.2 (ref 0.8–1.2)
Prothrombin Time: 14.8 seconds (ref 11.4–15.2)

## 2022-11-11 LAB — ABO/RH: ABO/RH(D): O POS

## 2022-11-11 LAB — CK: Total CK: 131 U/L (ref 49–397)

## 2022-11-11 LAB — PHOSPHORUS: Phosphorus: 3.1 mg/dL (ref 2.5–4.6)

## 2022-11-11 MED ORDER — HYDROCODONE-ACETAMINOPHEN 5-325 MG PO TABS
1.0000 | ORAL_TABLET | ORAL | Status: DC | PRN
  Administered 2022-11-16: 2 via ORAL
  Filled 2022-11-11: qty 2

## 2022-11-11 MED ORDER — ACETAMINOPHEN 650 MG RE SUPP: 650.0000 mg | Freq: Four times a day (QID) | RECTAL | Status: DC | PRN

## 2022-11-11 MED ORDER — PANTOPRAZOLE SODIUM 40 MG PO TBEC
40.0000 mg | DELAYED_RELEASE_TABLET | Freq: Every day | ORAL | Status: DC
  Administered 2022-11-11 – 2022-11-18 (×8): 40 mg via ORAL
  Filled 2022-11-11 (×8): qty 1

## 2022-11-11 MED ORDER — MAGNESIUM SULFATE 2 GM/50ML IV SOLN
2.0000 g | Freq: Once | INTRAVENOUS | Status: AC
  Administered 2022-11-11: 2 g via INTRAVENOUS
  Filled 2022-11-11: qty 50

## 2022-11-11 MED ORDER — THIAMINE MONONITRATE 100 MG PO TABS: 100.0000 mg | ORAL_TABLET | Freq: Every day | ORAL | Status: DC

## 2022-11-11 MED ORDER — ALBUTEROL SULFATE (2.5 MG/3ML) 0.083% IN NEBU: 2.5000 mg | INHALATION_SOLUTION | RESPIRATORY_TRACT | Status: DC | PRN

## 2022-11-11 MED ORDER — SODIUM CHLORIDE 0.9 % IV SOLN
2000.0000 mg | Freq: Once | INTRAVENOUS | Status: AC
  Administered 2022-11-11: 2000 mg via INTRAVENOUS
  Filled 2022-11-11: qty 20

## 2022-11-11 MED ORDER — HYDROCODONE-ACETAMINOPHEN 5-325 MG PO TABS
1.0000 | ORAL_TABLET | Freq: Four times a day (QID) | ORAL | Status: DC | PRN
  Administered 2022-11-11: 1 via ORAL
  Filled 2022-11-11: qty 1

## 2022-11-11 MED ORDER — CHLORDIAZEPOXIDE HCL 5 MG PO CAPS
15.0000 mg | ORAL_CAPSULE | Freq: Three times a day (TID) | ORAL | Status: DC
  Administered 2022-11-11 – 2022-11-13 (×7): 15 mg via ORAL
  Filled 2022-11-11 (×7): qty 3

## 2022-11-11 MED ORDER — LEVETIRACETAM IN NACL 500 MG/100ML IV SOLN
500.0000 mg | Freq: Two times a day (BID) | INTRAVENOUS | Status: AC
  Administered 2022-11-11: 500 mg via INTRAVENOUS
  Filled 2022-11-11 (×3): qty 100

## 2022-11-11 MED ORDER — LORAZEPAM 2 MG/ML IJ SOLN
1.0000 mg | INTRAMUSCULAR | Status: AC | PRN
  Administered 2022-11-13: 2 mg via INTRAVENOUS
  Filled 2022-11-11 (×2): qty 1

## 2022-11-11 MED ORDER — THIAMINE HCL 100 MG/ML IJ SOLN
250.0000 mg | Freq: Every day | INTRAVENOUS | Status: DC
  Administered 2022-11-14 – 2022-11-16 (×4): 250 mg via INTRAVENOUS
  Filled 2022-11-11 (×4): qty 2.5

## 2022-11-11 MED ORDER — ACETAMINOPHEN 325 MG PO TABS: 650.0000 mg | ORAL_TABLET | Freq: Four times a day (QID) | ORAL | Status: DC | PRN

## 2022-11-11 MED ORDER — DICLOFENAC SODIUM 1 % EX GEL
2.0000 g | Freq: Four times a day (QID) | CUTANEOUS | Status: DC
  Administered 2022-11-11 – 2022-11-18 (×13): 2 g via TOPICAL
  Filled 2022-11-11 (×2): qty 100

## 2022-11-11 MED ORDER — DIGOXIN 0.25 MG/ML IJ SOLN
0.1250 mg | Freq: Four times a day (QID) | INTRAMUSCULAR | Status: AC
  Administered 2022-11-11 (×2): 0.125 mg via INTRAVENOUS
  Filled 2022-11-11 (×2): qty 2

## 2022-11-11 MED ORDER — SODIUM CHLORIDE 0.9 % IV SOLN: INTRAVENOUS | Status: DC

## 2022-11-11 MED ORDER — THIAMINE HCL 100 MG/ML IJ SOLN: 100.0000 mg | Freq: Every day | INTRAMUSCULAR | Status: DC

## 2022-11-11 MED ORDER — MAGNESIUM SULFATE 4 GM/100ML IV SOLN
4.0000 g | Freq: Once | INTRAVENOUS | Status: AC
  Administered 2022-11-11: 4 g via INTRAVENOUS
  Filled 2022-11-11: qty 100

## 2022-11-11 MED ORDER — FOLIC ACID 1 MG PO TABS
1.0000 mg | ORAL_TABLET | Freq: Every day | ORAL | Status: DC
  Administered 2022-11-11 – 2022-11-18 (×8): 1 mg via ORAL
  Filled 2022-11-11 (×8): qty 1

## 2022-11-11 MED ORDER — ADULT MULTIVITAMIN W/MINERALS CH
1.0000 | ORAL_TABLET | Freq: Every day | ORAL | Status: DC
  Administered 2022-11-11 – 2022-11-18 (×8): 1 via ORAL
  Filled 2022-11-11 (×8): qty 1

## 2022-11-11 MED ORDER — NICOTINE 21 MG/24HR TD PT24
21.0000 mg | MEDICATED_PATCH | Freq: Every day | TRANSDERMAL | Status: DC
  Administered 2022-11-11 – 2022-11-18 (×8): 21 mg via TRANSDERMAL
  Filled 2022-11-11 (×8): qty 1

## 2022-11-11 MED ORDER — LORAZEPAM 2 MG/ML IJ SOLN: 2.0000 mg | INTRAMUSCULAR | Status: DC | PRN

## 2022-11-11 MED ORDER — THIAMINE HCL 100 MG/ML IJ SOLN
500.0000 mg | Freq: Three times a day (TID) | INTRAVENOUS | Status: AC
  Administered 2022-11-11 – 2022-11-12 (×5): 500 mg via INTRAVENOUS
  Filled 2022-11-11 (×7): qty 5

## 2022-11-11 MED ORDER — DILTIAZEM HCL 30 MG PO TABS
90.0000 mg | ORAL_TABLET | Freq: Three times a day (TID) | ORAL | Status: DC
  Administered 2022-11-11 – 2022-11-12 (×3): 90 mg via ORAL
  Filled 2022-11-11 (×2): qty 1
  Filled 2022-11-11: qty 3
  Filled 2022-11-11 (×3): qty 1

## 2022-11-11 MED ORDER — UMECLIDINIUM BROMIDE 62.5 MCG/ACT IN AEPB
1.0000 | INHALATION_SPRAY | Freq: Every day | RESPIRATORY_TRACT | Status: DC
  Administered 2022-11-11 – 2022-11-18 (×8): 1 via RESPIRATORY_TRACT
  Filled 2022-11-11: qty 7

## 2022-11-11 MED ORDER — FLUTICASONE FUROATE-VILANTEROL 100-25 MCG/ACT IN AEPB
1.0000 | INHALATION_SPRAY | Freq: Every day | RESPIRATORY_TRACT | Status: DC
  Administered 2022-11-11 – 2022-11-18 (×8): 1 via RESPIRATORY_TRACT
  Filled 2022-11-11: qty 28

## 2022-11-11 MED ORDER — DILTIAZEM HCL 25 MG/5ML IV SOLN
10.0000 mg | Freq: Once | INTRAVENOUS | Status: AC
  Administered 2022-11-11: 10 mg via INTRAVENOUS
  Filled 2022-11-11: qty 5

## 2022-11-11 MED ORDER — CARBAMAZEPINE 200 MG PO TABS
200.0000 mg | ORAL_TABLET | Freq: Two times a day (BID) | ORAL | Status: DC
  Administered 2022-11-11 – 2022-11-18 (×15): 200 mg via ORAL
  Filled 2022-11-11 (×16): qty 1

## 2022-11-11 MED ORDER — LEVETIRACETAM 500 MG PO TABS
500.0000 mg | ORAL_TABLET | Freq: Two times a day (BID) | ORAL | Status: AC
  Administered 2022-11-11 – 2022-11-13 (×5): 500 mg via ORAL
  Filled 2022-11-11 (×6): qty 1

## 2022-11-11 MED ORDER — LORAZEPAM 1 MG PO TABS: 1.0000 mg | ORAL_TABLET | ORAL | Status: DC | PRN

## 2022-11-11 NOTE — Progress Notes (Signed)
PROGRESS NOTE                                                                                                                                                                                                             Patient Demographics:    Jose [name], is a 52 y.o. male, DOB - 1971/04/06, IOM:[id number]  Outpatient Primary MD for the patient is Jon Billings, NP    LOS - 0  Admit date - 11/10/2022    Chief Complaint  Patient presents with   Seizures       Brief Narrative (HPI from H&P)   52 y.o. male with medical history significant of   chronic diastolic CHF, COPD, tobacco alcohol abuse, hypertension, seizures not compliant with his medications.  Patient was not feeling good for the last few days and had cut down his alcohol intake, he subsequently started getting jittery and felt like he was going to have a seizure. He subsequently had a seizure-like episode and was brought to the ER, in the ER he was also found to have new onset A-fib RVR.  He was seen by neurology.  Head CT was nonacute.  He was admitted to the hospital for further care.    Subjective:    Chrisean Kloth today has, No headache, No chest pain, No abdominal pain - No Nausea, No new weakness tingling or numbness, no SOB.   Assessment  & Plan :   Breakthrough seizure in a patient with history of seizures, noncompliant with antiepileptic medications and ongoing alcohol abuse. He was noncompliant with his seizure medications and was also abruptly trying to taper off his alcohol intake, likely seizure due to combination of alcohol withdrawal and subtherapeutic levels of his antiseizure medications.  Head CT stable, EEG nonacute, his home medication, for the next 3 days he will get Keppra overlap.  Has been counseled on compliance.  Seen by neurology.  Has been counseled not to drive for 6 months.   Ongoing alcohol and nicotine abuse.  Currently in early DTs and  alcohol withdrawal.  Placed on Librium along with CIWA protocol.  Counseled to quit.  New onset A-fib RVR.  Mali vas 2 score of 3.  He is an alcoholic, due to his underlying thrombocytopenia, noncompliance with his medications he is a poor candidate for anticoagulation long-term, currently in RVR, given  IV Cardizem push, 2 doses of IV digoxin, placed on oral Cardizem.  TSH stable.  Echo pending.  Telemetry monitoring.  Cardiology input.    Hypertension.  Blood pressure currently soft continue on Cardizem and monitor.  Gentle IV fluids.  Acute on chronic thrombocytopenia.  Likely due to underlying alcohol use and abuse.  Ultrasound suggestive of fatty liver.  Outpatient monitoring by PCP.  Acute metabolic encephalopathy.  Likely postictal as well.  Stable now.  May worsen again as he is going into DTs.  Asymptomatic transaminitis with fatty liver on ultrasound.  Due to alcohol abuse.  Counseled to quit.  COPD.  Stable.  Chronic diastolic CHF last EF 69%.  Currently dehydrated being hydrated with IV fluids.  Fall with right knee pain.  X-ray shows chronic changes.  Supportive care.  PT OT.  Hypomagnesemia.  Replaced.        Condition - Fair  Family Communication  : None present  Code Status :  Full  Consults  :  Neuro, Cards  PUD Prophylaxis : PPI   Procedures  :     TTE  EEG - Non acute  Right upper quadrant ultrasound.  Fatty liver.    CT head - non acute      Disposition Plan  :    Status is: Observation  DVT Prophylaxis  : SCDs    Lab Results  Component Value Date   PLT 49 (L) 11/11/2022    Diet :  Diet Order             DIET SOFT Fluid consistency: Thin  Diet effective now                    Inpatient Medications  Scheduled Meds:  carbamazepine  200 mg Oral BID   chlordiazePOXIDE  15 mg Oral TID   digoxin  0.125 mg Intravenous Q6H   diltiazem  10 mg Intravenous Once   diltiazem  90 mg Oral Q8H   fluticasone furoate-vilanterol  1 puff  Inhalation Daily   And   umeclidinium bromide  1 puff Inhalation Daily   folic acid  1 mg Oral Daily   levETIRAcetam  500 mg Oral BID   multivitamin with minerals  1 tablet Oral Daily   nicotine  21 mg Transdermal Daily   pantoprazole  40 mg Oral Daily   [START ON 11/19/2022] thiamine (VITAMIN B1) injection  100 mg Intravenous Daily   Continuous Infusions:  sodium chloride 75 mL/hr at 11/11/22 0620   levETIRAcetam     magnesium sulfate bolus IVPB     thiamine (VITAMIN B1) injection Stopped (11/11/22 0519)   Followed by   Derrill Memo ON 11/13/2022] thiamine (VITAMIN B1) injection     PRN Meds:.acetaminophen **OR** acetaminophen, albuterol, HYDROcodone-acetaminophen, LORazepam **OR** LORazepam, LORazepam  Antibiotics  :    Anti-infectives (From admission, onward)    None         Objective:   Vitals:   11/11/22 0524 11/11/22 0526 11/11/22 0815 11/11/22 0817  BP: (!) 107/91  (!) 126/99   Pulse: 78  (!) 127   Resp:   (!) 26   Temp:  98.6 F (37 C)    TempSrc:  Oral    SpO2:   91% 94%  Weight:      Height:        Wt Readings from Last 3 Encounters:  11/10/22 95 kg  10/20/22 95 kg  10/06/22 96.6 kg     Intake/Output Summary (Last  24 hours) at 11/11/2022 0943 Last data filed at 11/11/2022 0602 Gross per 24 hour  Intake 82.82 ml  Output --  Net 82.82 ml     Physical Exam  Awake Alert, No new F.N deficits, seems to be in early DTs, [place].AT,PERRAL Supple Neck, No JVD,   Symmetrical Chest wall movement, Good air movement bilaterally, CTAB iRRR,No Gallops,Rubs or new Murmurs,  +ve B.Sounds, Abd Soft, No tenderness,   No Cyanosis, Clubbing or edema        Data Review:    Recent Labs  Lab 11/10/22 1749 11/11/22 0114 11/11/22 0352 11/11/22 0630  WBC 4.1  --  4.5 4.2  HGB 12.6* 12.9* 11.7* 11.6*  HCT 38.0* 38.0* 34.1* 33.6*  PLT 53*  --  52* 49*  MCV 95.0  --  93.2 93.3  MCH 31.5  --  32.0 32.2  MCHC 33.2  --  34.3 34.5  RDW 15.4  --  15.4 15.5  LYMPHSABS 1.0   --   --   --   MONOABS 0.5  --   --   --   EOSABS 0.0  --   --   --   BASOSABS 0.0  --   --   --     Recent Labs  Lab 11/10/22 1749 11/11/22 0004 11/11/22 0114 11/11/22 0429 11/11/22 0630  NA 137  --  138  --  136  K 4.2  --  3.6  --  3.8  CL 101  --   --   --  103  CO2 22  --   --   --  24  ANIONGAP 14  --   --   --  9  GLUCOSE 107*  --   --   --  81  BUN 26*  --   --   --  18  CREATININE 0.73  --   --   --  0.69  AST 93*  --   --   --   --   ALT 48*  --   --   --   --   ALKPHOS 103  --   --   --   --   BILITOT 1.0  --   --   --   --   ALBUMIN 3.1*  --   --   --   --   LATICACIDVEN  --  0.9  --  0.8  --   INR  --  1.2  --   --   --   TSH  --  1.436  --   --   --   AMMONIA  --  39*  --   --   --   BNP  --   --   --   --  524.6*  MG  --  1.0*  --   --   --   CALCIUM 8.2*  --   --   --  7.6*       Radiology Reports EEG adult  Result Date: 11/11/2022 Lora Havens, MD     11/11/2022  8:36 AM Patient Name: [name] MRN: [id number] Epilepsy Attending: Lora Havens Referring Physician/Provider: Toy Baker, MD Date: 11/11/2022 Duration: 22.04 mins Patient history: 52yo M with h/o epilepsy came with seizure. EEG to evaluate for seizure. Level of alertness: Awake, asleep AEDs during EEG study: CBZ, LEV Technical aspects: This EEG study was done with scalp electrodes positioned according to the 10-20 International system of electrode  placement. Electrical activity was reviewed with band pass filter of 1-'70Hz'$ , sensitivity of 7 uV/mm, display speed of 44m/sec with a '60Hz'$  notched filter applied as appropriate. EEG data were recorded continuously and digitally stored.  Video monitoring was available and reviewed as appropriate. Description: The posterior dominant rhythm consists of 9-10 Hz activity of moderate voltage (25-35 uV) seen predominantly in posterior head regions, symmetric and reactive to eye opening and eye closing. Sleep was characterized by vertex waves,  sleep spindles (12 to 14 Hz), maximal frontocentral region.  Physiologic photic driving was not seen during photic stimulation.  Hyperventilation was not performed.   IMPRESSION: This study is within normal limits. No seizures or epileptiform discharges were seen throughout the recording. A normal interictal EEG does not exclude the diagnosis of epilepsy. Priyanka OBarbra Sarks  [place]dom[place] Limited RUQ (LIVER/GB)  Result Date: 11/11/2022 CLINICAL DATA:  Elevated liver function tests. EXAM: ULTRASOUND ABDOMEN LIMITED RIGHT UPPER QUADRANT COMPARISON:  None Available. FINDINGS: Gallbladder: No gallstones or wall thickening visualized (2.4 mm). No sonographic Murphy sign noted by sonographer. Common bile duct: Diameter: 2.8 mm Liver: No focal lesion identified. Diffusely increased echogenicity of the liver parenchyma is noted. Portal vein is patent on color Doppler imaging with normal direction of blood flow towards the liver. Other: None. IMPRESSION: Hepatic steatosis without focal liver lesions. Electronically Signed   By: TVirgina NorfolkM.D.   On: 11/11/2022 02:14   CT HEAD WO CONTRAST (5MM)  Result Date: 11/11/2022 CLINICAL DATA:  Status post seizure. EXAM: CT HEAD WITHOUT CONTRAST TECHNIQUE: Contiguous axial images were obtained from the base of the skull through the vertex without intravenous contrast. RADIATION DOSE REDUCTION: This exam was performed according to the departmental dose-optimization program which includes automated exposure control, adjustment of the mA and/or kV according to patient size and/or use of iterative reconstruction technique. COMPARISON:  November 10, 2022 FINDINGS: Brain: There is mild cerebral atrophy with widening of the extra-axial spaces and ventricular dilatation. There are areas of decreased attenuation within the white matter tracts of the supratentorial brain, consistent with microvascular disease changes. Vascular: There is mild to moderate severity calcification of the  bilateral cavernous carotid arteries. Skull: Small, bilateral nondisplaced nasal bone fractures are seen. These are of indeterminate age. Sinuses/Orbits: There is marked severity bilateral maxillary sinus and bilateral ethmoid sinus mucosal thickening. Other: None. IMPRESSION: 1. No acute intracranial abnormality. 2. Generalized cerebral atrophy with chronic white matter small vessel ischemic changes. 3. Marked severity bilateral maxillary sinus and bilateral ethmoid sinus disease. Electronically Signed   By: TVirgina NorfolkM.D.   On: 11/11/2022 01:38   DG Knee 1-2 Views Right  Result Date: 11/11/2022 CLINICAL DATA:  Recent fall with knee pain, initial encounter EXAM: RIGHT KNEE - 2 VIEW COMPARISON:  07/09/2021 FINDINGS: No acute fracture or dislocation is noted. Degenerative changes of the knee joint are seen. Mild soft tissue swelling is noted in the suprapatellar region likely related to the recent injury. IMPRESSION: Degenerative change without acute bony abnormality. Electronically Signed   By: MInez CatalinaM.D.   On: 11/11/2022 01:21   DG CHEST PORT 1 VIEW  Result Date: 11/11/2022 CLINICAL DATA:  Syncope. EXAM: PORTABLE CHEST 1 VIEW COMPARISON:  None Available. FINDINGS: The cardiac silhouette is enlarged and unchanged in size. There is moderate severity calcification of the aortic arch. Mild, bilateral perihilar atelectatic changes are seen. There is no evidence of a pleural effusion or pneumothorax. The visualized skeletal structures are unremarkable. IMPRESSION: Cardiomegaly with mild  bilateral perihilar atelectatic changes. Electronically Signed   By: Virgina Norfolk M.D.   On: 11/11/2022 00:28   DG Lumbar Spine Complete  Result Date: 11/10/2022 CLINICAL DATA:  Fall. EXAM: LUMBAR SPINE - COMPLETE 4+ VIEW COMPARISON:  None Available. FINDINGS: No fracture or traumatic malalignment. Multilevel degenerative disc disease, most prominent in the lower lumbar spine. Lower lumbar facet degenerative  changes. No other bony or soft tissue abnormalities identified. IMPRESSION: No fracture or traumatic malalignment. Degenerative changes as above. Electronically Signed   By: Dorise Bullion III M.D.   On: 11/10/2022 17:51   CT Head Wo Contrast  Result Date: 11/10/2022 CLINICAL DATA:  Seizures, new onset.  Head and facial trauma. EXAM: CT HEAD WITHOUT CONTRAST CT MAXILLOFACIAL WITHOUT CONTRAST TECHNIQUE: Multidetector CT imaging of the head and maxillofacial structures were performed using the standard protocol without intravenous contrast. Multiplanar CT image reconstructions of the maxillofacial structures were also generated. RADIATION DOSE REDUCTION: This exam was performed according to the departmental dose-optimization program which includes automated exposure control, adjustment of the mA and/or kV according to patient size and/or use of iterative reconstruction technique. COMPARISON:  None Available. FINDINGS: CT HEAD FINDINGS Brain: No evidence of acute infarction, hemorrhage, hydrocephalus, extra-axial collection or mass lesion/mass effect. Mild generalized cerebral atrophy. Patchy area of low-attenuation of the periventricular white matter presumed chronic microvascular ischemic changes. Vascular: No hyperdense vessel or unexpected calcification. Skull: Normal. Negative for fracture or focal lesion. Other: None. CT MAXILLOFACIAL FINDINGS Osseous: No fracture or mandibular dislocation. No destructive process. Orbits: Negative. No traumatic or inflammatory finding. Sinuses: Marked mucosal thickening of the bilateral maxillary sinuses with partial opacification. Mucosal thickening of the ethmoid air cells. Remaining paranasal sinuses and mastoid air cells appear clear. Soft tissues: No significant hematoma or fluid collection. IMPRESSION: CT head: 1. No acute intracranial abnormality. 2. Mild generalized cerebral atrophy and chronic microvascular ischemic changes of the white matter. CT maxillofacial: 1.  No acute facial bone fracture. 2. Bilateral maxillary and ethmoid sinus disease. Electronically Signed   By: Keane Police D.O.   On: 11/10/2022 17:33   CT Maxillofacial Wo Contrast  Result Date: 11/10/2022 CLINICAL DATA:  Seizures, new onset.  Head and facial trauma. EXAM: CT HEAD WITHOUT CONTRAST CT MAXILLOFACIAL WITHOUT CONTRAST TECHNIQUE: Multidetector CT imaging of the head and maxillofacial structures were performed using the standard protocol without intravenous contrast. Multiplanar CT image reconstructions of the maxillofacial structures were also generated. RADIATION DOSE REDUCTION: This exam was performed according to the departmental dose-optimization program which includes automated exposure control, adjustment of the mA and/or kV according to patient size and/or use of iterative reconstruction technique. COMPARISON:  None Available. FINDINGS: CT HEAD FINDINGS Brain: No evidence of acute infarction, hemorrhage, hydrocephalus, extra-axial collection or mass lesion/mass effect. Mild generalized cerebral atrophy. Patchy area of low-attenuation of the periventricular white matter presumed chronic microvascular ischemic changes. Vascular: No hyperdense vessel or unexpected calcification. Skull: Normal. Negative for fracture or focal lesion. Other: None. CT MAXILLOFACIAL FINDINGS Osseous: No fracture or mandibular dislocation. No destructive process. Orbits: Negative. No traumatic or inflammatory finding. Sinuses: Marked mucosal thickening of the bilateral maxillary sinuses with partial opacification. Mucosal thickening of the ethmoid air cells. Remaining paranasal sinuses and mastoid air cells appear clear. Soft tissues: No significant hematoma or fluid collection. IMPRESSION: CT head: 1. No acute intracranial abnormality. 2. Mild generalized cerebral atrophy and chronic microvascular ischemic changes of the white matter. CT maxillofacial: 1. No acute facial bone fracture. 2. Bilateral maxillary and  ethmoid sinus  disease. Electronically Signed   By: Keane Police D.O.   On: 11/10/2022 17:33      Signature  -   Lala Lund M.D on 11/11/2022 at 9:43 AM   -  To page go to [email or web address]

## 2022-11-11 NOTE — Consult Note (Addendum)
Cardiology Consultation   Patient ID: Jose [name]. MRN: [id number]; DOB: [birth year]  Admit date: 11/10/2022 Date of Consult: 11/11/2022  PCP:  Jon Billings, NP   [place] Providers Cardiologist:  New to [facility]     Patient Profile:   Jose [name]. is a 52 y.o. male with a hx of HTN, HLD, tobacco abuse, COPD, EtOH abuse, history of seizure and chronic diastolic heart failure who is being seen 11/11/2022 for the evaluation of atrial fibrillation with RVR at the request of Dr. Candiss Norse.  History of Present Illness:   Jose [name] is a 52 year old male with past medical history of HTN, HLD, tobacco abuse, COPD, EtOH abuse, history of seizure and chronic diastolic heart failure.  Father had multiple sclerosis.  Mother has hypertension and diabetes.  Previous echocardiogram obtained on 11/05/2021 showed EF 60 to 65%, grade 1 DD, normal RV, no significant valve issue, mildly dilated aortic root measuring 43 mm.  PFT obtained in June 2023 showed FEV1 43%, FVC 57%, FEV1 to FVC ratio 60.  He was previously admitted in August with COPD exacerbation.  He has been followed by Darylene Price of [facility] heart failure clinic over the past few years.  Recently, he was referred to Dr. Haroldine Laws of [facility] heart failure clinic and is scheduled to establish with Dr. Haroldine Laws on 11/15/2022.  Unfortunately, patient has not been compliant with his medication.  He says he missed his medication at least 2 days a week, however this is likely a understatement.    For the past week, he has been having fever, chill, cough and fatigue.  He usually drinks half a pint of liquor every day, the last time he drank a large quantity of liquor was on 11/07/2022, he has not been able to keep any liquor down for the past 3 days due to abdominal discomfort.  Yesterday he had a episode of seizure.  On EMS arrival, he was hypotensive with blood pressure of 60/40 and was given IV fluid.  On arrival to [facility], he was also  noted to be in new onset of atrial fibrillation with RVR.  He says he will occasionally feel some palpitations however does not have cardiac awareness most of the time.  His heart rate in the emergency room was 130s.  CT of the head and maxillofacial were negative for acute process, he does have bilateral maxillary and ethmoid sinus disease.  AST 93, ALT 48.  Alcohol level less than 10.  Hemoglobin 12.6.  He is on Tegretol at home, blood work shows Tegretol level is undetectable.  Neurology service was consulted for possible alcohol withdrawal seizure.  EEG performed in the ED showed no epileptiform activity.  Neurology service contacted his pharmacy, the last time he picked up a 30-day supply of Tegretol was back in October.  It was suspected he had was withdrawal seizure in the setting of noncompliance with his seizure medication and alcohol withdrawal.  Patient does not appear to be interested in cutting back on alcohol intake.  Abdominal ultrasound showed hepatic steatosis without focal liver lesion.  Echocardiogram obtained on 11/11/2022 showed EF 60 to 65%, no regional wall motion abnormality, mildly elevated PASP, moderate LAE, mildly dilated ascending aorta at 39 mm.  Cardiology service consulted for atrial fibrillation with RVR.  Note, although urine drug test was negative, however he is positive for RSV.   Past Medical History:  Diagnosis Date   Alcohol abuse    Cervicalgia  CHF (congestive heart failure) (HCC)    Hyperlipidemia    Hypertension    Seizures ([place])     Past Surgical History:  Procedure Laterality Date   COLONOSCOPY WITH PROPOFOL N/A 01/16/2018   Procedure: COLONOSCOPY WITH PROPOFOL;  Surgeon: Lin Landsman, MD;  Location: [facility] ENDOSCOPY;  Service: Gastroenterology;  Laterality: N/A;   ESOPHAGOGASTRODUODENOSCOPY (EGD) WITH PROPOFOL N/A 01/16/2018   Procedure: ESOPHAGOGASTRODUODENOSCOPY (EGD) WITH PROPOFOL;  Surgeon: Lin Landsman, MD;  Location: [facility] ENDOSCOPY;   Service: Gastroenterology;  Laterality: N/A;     Home Medications:  Prior to Admission medications   Medication Sig Start Date End Date Taking? Authorizing Provider  albuterol (VENTOLIN HFA) 108 (90 Base) MCG/ACT inhaler Inhale 2 puffs into the lungs every 6 (six) hours as needed for wheezing or shortness of breath. 06/27/22   Lucillie Garfinkel, MD  amLODipine (NORVASC) 10 MG tablet Take 1 tablet (10 mg total) by mouth daily. 04/18/22   Jon Billings, NP  carbamazepine (TEGRETOL) 200 MG tablet Take 1 tablet (200 mg total) by mouth 2 (two) times daily. 08/24/22   Jon Billings, NP  carvedilol (COREG) 25 MG tablet Take 1 tablet (25 mg total) by mouth 2 (two) times daily with a meal. 04/18/22   Jon Billings, NP  empagliflozin (JARDIANCE) 10 MG TABS tablet Take 1 tablet (10 mg total) by mouth daily before breakfast. 05/26/22   Jon Billings, NP  Fluticasone-Umeclidin-Vilant (TRELEGY ELLIPTA) 100-62.5-25 MCG/ACT AEPB Inhale 1 puff into the lungs daily. 08/24/22   Jon Billings, NP  furosemide (LASIX) 40 MG tablet Take 1 tablet (40 mg total) by mouth daily. 10/04/22   Alisa Graff, FNP  hydrALAZINE (APRESOLINE) 50 MG tablet Take 1 tablet (50 mg total) by mouth 3 (three) times daily. 04/18/22   Jon Billings, NP  Iron, Ferrous Sulfate, 325 (65 Fe) MG TABS Take 325 mg by mouth daily. 08/24/22   Jon Billings, NP  isosorbide mononitrate (IMDUR) 30 MG 24 hr tablet Take 1 tablet (30 mg total) by mouth daily. Patient not taking: Reported on 10/20/2022 08/24/22   Jon Billings, NP  potassium chloride (MICRO-K) 10 MEQ CR capsule Take 1 capsule (10 mEq total) by mouth daily. 10/04/22   Alisa Graff, FNP  rosuvastatin (CRESTOR) 20 MG tablet Take 1 tablet (20 mg total) by mouth daily. 08/24/22   Jon Billings, NP  sacubitril-valsartan (ENTRESTO) 49-51 MG Take 1 tablet by mouth 2 (two) times daily. 10/20/22   Alisa Graff, FNP    Inpatient Medications: Scheduled Meds:   carbamazepine  200 mg Oral BID   chlordiazePOXIDE  15 mg Oral TID   diclofenac Sodium  2 g Topical QID   digoxin  0.125 mg Intravenous Q6H   diltiazem  90 mg Oral Q8H   fluticasone furoate-vilanterol  1 puff Inhalation Daily   And   umeclidinium bromide  1 puff Inhalation Daily   folic acid  1 mg Oral Daily   levETIRAcetam  500 mg Oral BID   multivitamin with minerals  1 tablet Oral Daily   nicotine  21 mg Transdermal Daily   pantoprazole  40 mg Oral Daily   [START ON 11/19/2022] thiamine (VITAMIN B1) injection  100 mg Intravenous Daily   Continuous Infusions:  sodium chloride 75 mL/hr at 11/11/22 0620   levETIRAcetam     magnesium sulfate bolus IVPB 4 g (11/11/22 1157)   thiamine (VITAMIN B1) injection Stopped (11/11/22 0519)   Followed by   Derrill Memo ON 11/13/2022] thiamine (VITAMIN B1) injection  PRN Meds: acetaminophen **OR** acetaminophen, albuterol, HYDROcodone-acetaminophen, LORazepam **OR** LORazepam, LORazepam  Allergies:   No Known Allergies  Social History:   Social History   Socioeconomic History   Marital status: Widowed    Spouse name: Not on file   Number of children: Not on file   Years of education: Not on file   Highest education level: Not on file  Occupational History   Not on file  Tobacco Use   Smoking status: Every Day    Packs/day: 1.00    Years: 33.00    Total pack years: 33.00    Types: Cigars, Cigarettes    Start date: 20    Last attempt to quit: 2020    Years since quitting: 4.0   Smokeless tobacco: Former   Tobacco comments:    2 cigars a day-05/05/2022    Quit smoking cigarettes 3 years ago.  Smokes cigarettes since he was 52 years old.  Vaping Use   Vaping Use: Never used  Substance and Sexual Activity   Alcohol use: Yes    Alcohol/week: 28.0 standard drinks of alcohol    Types: 7 Cans of beer, 21 Shots of liquor per week    Comment: half pint liquor per day   Drug use: No    Types: Marijuana    Comment: quit back in the late  90's   Sexual activity: Yes  Other Topics Concern   Not on file  Social History Narrative   Not on file   Social Determinants of Health   Financial Resource Strain: Low Risk  (07/01/2019)   Overall Financial Resource Strain (CARDIA)    Difficulty of Paying Living Expenses: Not hard at all  Food Insecurity: No Food Insecurity (07/01/2019)   Hunger Vital Sign    Worried About Running Out of Food in the Last Year: Never true    Ran Out of Food in the Last Year: Never true  Transportation Needs: No Transportation Needs (07/01/2019)   PRAPARE - Hydrologist (Medical): No    Lack of Transportation (Non-Medical): No  Physical Activity: Sufficiently Active (07/01/2019)   Exercise Vital Sign    Days of Exercise per Week: 3 days    Minutes of Exercise per Session: 50 min  Stress: No Stress Concern Present (09/02/2019)   [place]    Feeling of Stress : Not at all  Social Connections: Moderately Isolated (07/01/2019)   Social Connection and Isolation Panel [NHANES]    Frequency of Communication with Friends and Family: Three times a week    Frequency of Social Gatherings with Friends and Family: Three times a week    Attends Religious Services: 1 to 4 times per year    Active Member of Clubs or Organizations: No    Attends Archivist Meetings: Never    Marital Status: Widowed  Intimate Partner Violence: Not At Risk (07/01/2019)   Humiliation, Afraid, Rape, and Kick questionnaire    Fear of Current or Ex-Partner: No    Emotionally Abused: No    Physically Abused: No    Sexually Abused: No    Family History:    Family History  Problem Relation Age of Onset   Hypertension Mother    Diabetes Mother    Colon cancer Mother    Hypertension Father    Multiple sclerosis Father      ROS:  Please see the history of present illness.   All other  ROS reviewed and negative.     Physical  Exam/Data:   Vitals:   11/11/22 0526 11/11/22 0815 11/11/22 0817 11/11/22 1011  BP:  (!) 126/99    Pulse:  (!) 127    Resp:  (!) 26    Temp: 98.6 F (37 C)   98.3 F (36.8 C)  TempSrc: Oral   Oral  SpO2:  91% 94%   Weight:      Height:        Intake/Output Summary (Last 24 hours) at 11/11/2022 1225 Last data filed at 11/11/2022 1204 Gross per 24 hour  Intake 129.45 ml  Output --  Net 129.45 ml      11/10/2022   10:00 PM 10/20/2022    3:51 PM 10/06/2022    2:49 PM  Last 3 Weights  Weight (lbs) 209 lb 7 oz 209 lb 8 oz 213 lb  Weight (kg) 95 kg 95.029 kg 96.616 kg     Body mass index is 29.63 kg/m.  General:  Well nourished, well developed, in no acute distress HEENT: normal Neck: no JVD Vascular: No carotid bruits; Distal pulses 2+ bilaterally Cardiac:  normal S1, S2; RRR; no murmur  Lungs: Bilateral rhonchi, no crackles. Abd: soft, nontender, no hepatomegaly  Ext: no edema Musculoskeletal:  No deformities, BUE and BLE strength normal and equal Skin: warm and dry  Neuro:  CNs 2-12 intact, no focal abnormalities noted Psych:  Normal affect   EKG:  The EKG was personally reviewed and demonstrates: Atrial fibrillation with RVR Telemetry:  Telemetry was personally reviewed and demonstrates: Atrial fibrillation, heart rate 120-130s.  Relevant CV Studies:  Echo 11/11/2022  1. LVEF challenging with afib/ PVC. Left ventricular ejection fraction,  by estimation, is 50 to 55%. The left ventricle has low normal function.  The left ventricle has no regional wall motion abnormalities. There is  mild left ventricular hypertrophy.  Left ventricular diastolic parameters are indeterminate.   2. Right ventricular systolic function is normal. The right ventricular  size is normal. There is mildly elevated pulmonary artery systolic  pressure.   3. Left atrial size was moderately dilated.   4. Right atrial size was moderately dilated.   5. Trivial mitral valve regurgitation.   6. The  aortic valve is tricuspid. Aortic valve regurgitation is not  visualized.   7. There is mild dilatation of the ascending aorta, measuring 39 mm.   8. The inferior vena cava is normal in size with greater than 50%  respiratory variability, suggesting right atrial pressure of 3 mmHg.    Laboratory Data:  High Sensitivity Troponin:   Recent Labs  Lab 11/11/22 0004 11/11/22 0352  TROPONINIHS 8 8     Chemistry Recent Labs  Lab 11/10/22 1749 11/11/22 0004 11/11/22 0114 11/11/22 0630  NA 137  --  138 136  K 4.2  --  3.6 3.8  CL 101  --   --  103  CO2 22  --   --  24  GLUCOSE 107*  --   --  81  BUN 26*  --   --  18  CREATININE 0.73  --   --  0.69  CALCIUM 8.2*  --   --  7.6*  MG  --  1.0*  --   --   GFRNONAA >60  --   --  >60  ANIONGAP 14  --   --  9    Recent Labs  Lab 11/10/22 1749  PROT 5.8*  ALBUMIN 3.1*  AST 93*  ALT 48*  ALKPHOS 103  BILITOT 1.0   Lipids No results for input(s): "CHOL", "TRIG", "HDL", "LABVLDL", "LDLCALC", "CHOLHDL" in the last 168 hours.  Hematology Recent Labs  Lab 11/10/22 1749 11/11/22 0114 11/11/22 0352 11/11/22 0630  WBC 4.1  --  4.5 4.2  RBC 4.00*  --  3.66* 3.60*  HGB 12.6* 12.9* 11.7* 11.6*  HCT 38.0* 38.0* 34.1* 33.6*  MCV 95.0  --  93.2 93.3  MCH 31.5  --  32.0 32.2  MCHC 33.2  --  34.3 34.5  RDW 15.4  --  15.4 15.5  PLT 53*  --  52* 49*   Thyroid  Recent Labs  Lab 11/11/22 0004  TSH 1.436    BNP Recent Labs  Lab 11/11/22 0630  BNP 524.6*    DDimer No results for input(s): "DDIMER" in the last 168 hours.   Radiology/Studies:  ECHOCARDIOGRAM COMPLETE  Result Date: 11/11/2022    ECHOCARDIOGRAM REPORT   Patient Name:   Jose [name]. Date of Exam: 11/11/2022 Medical Rec #:  [id number]      Height:       70.5 in Accession #:    [id number]     Weight:       209.4 lb Date of Birth:  Feb 08, 1971       BSA:          2.140 m Patient Age:    50 years       BP:           126/99 mmHg Patient Gender: M              HR:            115 bpm. Exam Location:  Inpatient Procedure: 2D Echo, Cardiac Doppler and Color Doppler Indications:    I48.91* Unspeicified atrial fibrillation  History:        Patient has prior history of Echocardiogram examinations, most                 recent 11/05/2021. CHF; Risk Factors:Hypertension, Dyslipidemia                 and ETOH.  Sonographer:    Bernadene Person RDCS Referring Phys: Toy Baker IMPRESSIONS  1. LVEF challenging with afib/ PVC. Left ventricular ejection fraction, by estimation, is 50 to 55%. The left ventricle has low normal function. The left ventricle has no regional wall motion abnormalities. There is mild left ventricular hypertrophy. Left ventricular diastolic parameters are indeterminate.  2. Right ventricular systolic function is normal. The right ventricular size is normal. There is mildly elevated pulmonary artery systolic pressure.  3. Left atrial size was moderately dilated.  4. Right atrial size was moderately dilated.  5. Trivial mitral valve regurgitation.  6. The aortic valve is tricuspid. Aortic valve regurgitation is not visualized.  7. There is mild dilatation of the ascending aorta, measuring 39 mm.  8. The inferior vena cava is normal in size with greater than 50% respiratory variability, suggesting right atrial pressure of 3 mmHg. FINDINGS  Left Ventricle: LVEF challenging with afib/ PVC. Left ventricular ejection fraction, by estimation, is 50 to 55%. The left ventricle has low normal function. The left ventricle has no regional wall motion abnormalities. The left ventricular internal cavity size was normal in size. There is mild left ventricular hypertrophy. Left ventricular diastolic parameters are indeterminate. Right Ventricle: The right ventricular size is normal. Right ventricular systolic function is normal. There is mildly elevated  pulmonary artery systolic pressure. The tricuspid regurgitant velocity is 2.87 m/s, and with an assumed right atrial pressure of 8 mmHg,  the estimated right ventricular systolic pressure is 68.3 mmHg. Left Atrium: Left atrial size was moderately dilated. Right Atrium: Right atrial size was moderately dilated. Pericardium: There is no evidence of pericardial effusion. Mitral Valve: Trivial mitral valve regurgitation. Tricuspid Valve: Tricuspid valve regurgitation is mild. Aortic Valve: The aortic valve is tricuspid. Aortic valve regurgitation is not visualized. Aortic regurgitation PHT measures 388 msec. Pulmonic Valve: Pulmonic valve regurgitation is not visualized. Aorta: There is mild dilatation of the ascending aorta, measuring 39 mm. Venous: The inferior vena cava is normal in size with greater than 50% respiratory variability, suggesting right atrial pressure of 3 mmHg. IAS/Shunts: The interatrial septum was not well visualized.  LEFT VENTRICLE PLAX 2D LVIDd:         5.00 cm LVIDs:         3.50 cm LV PW:         1.10 cm LV IVS:        1.00 cm LVOT diam:     2.50 cm LV SV:         53 LV SV Index:   25 LVOT Area:     4.91 cm  LV Volumes (MOD) LV vol d, MOD A2C: 135.0 ml LV vol d, MOD A4C: 136.0 ml LV vol s, MOD A2C: 79.7 ml LV vol s, MOD A4C: 69.7 ml LV SV MOD A2C:     55.3 ml LV SV MOD A4C:     136.0 ml LV SV MOD BP:      62.5 ml RIGHT VENTRICLE TAPSE (M-mode): 1.8 cm LEFT ATRIUM              Index        RIGHT ATRIUM           Index LA diam:        4.20 cm  1.96 cm/m   RA Area:     26.40 cm LA Vol (A2C):   93.7 ml  43.79 ml/m  RA Volume:   88.80 ml  41.50 ml/m LA Vol (A4C):   101.0 ml 47.20 ml/m LA Biplane Vol: 98.5 ml  46.03 ml/m  AORTIC VALVE LVOT Vmax:   67.42 cm/s LVOT Vmean:  45.950 cm/s LVOT VTI:    0.108 m AI PHT:      388 msec  AORTA Ao Root diam: 4.40 cm Ao Asc diam:  3.90 cm TRICUSPID VALVE TR Peak grad:   32.9 mmHg TR Vmax:        287.00 cm/s  SHUNTS Systemic VTI:  0.11 m Systemic Diam: 2.50 cm Phineas Inches Electronically signed by Phineas Inches Signature Date/Time: 11/11/2022/11:18:12 AM    Final    EEG adult  Result Date:  11/11/2022 Lora Havens, MD     11/11/2022  8:36 AM Patient Name: Jose [name]. MRN: [id number] Epilepsy Attending: Lora Havens Referring Physician/Provider: Toy Baker, MD Date: 11/11/2022 Duration: 22.04 mins Patient history: 52yo M with h/o epilepsy came with seizure. EEG to evaluate for seizure. Level of alertness: Awake, asleep AEDs during EEG study: CBZ, LEV Technical aspects: This EEG study was done with scalp electrodes positioned according to the 10-20 International system of electrode placement. Electrical activity was reviewed with band pass filter of 1-'70Hz'$ , sensitivity of 7 uV/mm, display speed of 85m/sec with a '60Hz'$  notched filter applied as appropriate. EEG data were recorded continuously and digitally stored.  Video monitoring was available and reviewed as appropriate. Description: The posterior dominant rhythm consists of 9-10 Hz activity of moderate voltage (25-35 uV) seen predominantly in posterior head regions, symmetric and reactive to eye opening and eye closing. Sleep was characterized by vertex waves, sleep spindles (12 to 14 Hz), maximal frontocentral region.  Physiologic photic driving was not seen during photic stimulation.  Hyperventilation was not performed.   IMPRESSION: This study is within normal limits. No seizures or epileptiform discharges were seen throughout the recording. A normal interictal EEG does not exclude the diagnosis of epilepsy. Priyanka Barbra Sarks   US Abdomen Limited RUQ (LIVER/GB)  Result Date: 11/11/2022 CLINICAL DATA:  Elevated liver function tests. EXAM: ULTRASOUND ABDOMEN LIMITED RIGHT UPPER QUADRANT COMPARISON:  None Available. FINDINGS: Gallbladder: No gallstones or wall thickening visualized (2.4 mm). No sonographic Murphy sign noted by sonographer. Common bile duct: Diameter: 2.8 mm Liver: No focal lesion identified. Diffusely increased echogenicity of the liver parenchyma is noted. Portal vein is patent on color Doppler imaging with normal  direction of blood flow towards the liver. Other: None. IMPRESSION: Hepatic steatosis without focal liver lesions. Electronically Signed   By: Virgina Norfolk M.D.   On: 11/11/2022 02:14   CT HEAD WO CONTRAST (5MM)  Result Date: 11/11/2022 CLINICAL DATA:  Status post seizure. EXAM: CT HEAD WITHOUT CONTRAST TECHNIQUE: Contiguous axial images were obtained from the base of the skull through the vertex without intravenous contrast. RADIATION DOSE REDUCTION: This exam was performed according to the departmental dose-optimization program which includes automated exposure control, adjustment of the mA and/or kV according to patient size and/or use of iterative reconstruction technique. COMPARISON:  November 10, 2022 FINDINGS: Brain: There is mild cerebral atrophy with widening of the extra-axial spaces and ventricular dilatation. There are areas of decreased attenuation within the white matter tracts of the supratentorial brain, consistent with microvascular disease changes. Vascular: There is mild to moderate severity calcification of the bilateral cavernous carotid arteries. Skull: Small, bilateral nondisplaced nasal bone fractures are seen. These are of indeterminate age. Sinuses/Orbits: There is marked severity bilateral maxillary sinus and bilateral ethmoid sinus mucosal thickening. Other: None. IMPRESSION: 1. No acute intracranial abnormality. 2. Generalized cerebral atrophy with chronic white matter small vessel ischemic changes. 3. Marked severity bilateral maxillary sinus and bilateral ethmoid sinus disease. Electronically Signed   By: Virgina Norfolk M.D.   On: 11/11/2022 01:38   DG Knee 1-2 Views Right  Result Date: 11/11/2022 CLINICAL DATA:  Recent fall with knee pain, initial encounter EXAM: RIGHT KNEE - 2 VIEW COMPARISON:  07/09/2021 FINDINGS: No acute fracture or dislocation is noted. Degenerative changes of the knee joint are seen. Mild soft tissue swelling is noted in the suprapatellar region  likely related to the recent injury. IMPRESSION: Degenerative change without acute bony abnormality. Electronically Signed   By: Inez Catalina M.D.   On: 11/11/2022 01:21   DG CHEST PORT 1 VIEW  Result Date: 11/11/2022 CLINICAL DATA:  Syncope. EXAM: PORTABLE CHEST 1 VIEW COMPARISON:  None Available. FINDINGS: The cardiac silhouette is enlarged and unchanged in size. There is moderate severity calcification of the aortic arch. Mild, bilateral perihilar atelectatic changes are seen. There is no evidence of a pleural effusion or pneumothorax. The visualized skeletal structures are unremarkable. IMPRESSION: Cardiomegaly with mild bilateral perihilar atelectatic changes. Electronically Signed   By: Virgina Norfolk M.D.   On: 11/11/2022 00:28   DG Lumbar Spine Complete  Result Date: 11/10/2022 CLINICAL DATA:  Fall. EXAM: LUMBAR SPINE -  COMPLETE 4+ VIEW COMPARISON:  None Available. FINDINGS: No fracture or traumatic malalignment. Multilevel degenerative disc disease, most prominent in the lower lumbar spine. Lower lumbar facet degenerative changes. No other bony or soft tissue abnormalities identified. IMPRESSION: No fracture or traumatic malalignment. Degenerative changes as above. Electronically Signed   By: Dorise Bullion III M.D.   On: 11/10/2022 17:51   CT Head Wo Contrast  Result Date: 11/10/2022 CLINICAL DATA:  Seizures, new onset.  Head and facial trauma. EXAM: CT HEAD WITHOUT CONTRAST CT MAXILLOFACIAL WITHOUT CONTRAST TECHNIQUE: Multidetector CT imaging of the head and maxillofacial structures were performed using the standard protocol without intravenous contrast. Multiplanar CT image reconstructions of the maxillofacial structures were also generated. RADIATION DOSE REDUCTION: This exam was performed according to the departmental dose-optimization program which includes automated exposure control, adjustment of the mA and/or kV according to patient size and/or use of iterative reconstruction  technique. COMPARISON:  None Available. FINDINGS: CT HEAD FINDINGS Brain: No evidence of acute infarction, hemorrhage, hydrocephalus, extra-axial collection or mass lesion/mass effect. Mild generalized cerebral atrophy. Patchy area of low-attenuation of the periventricular white matter presumed chronic microvascular ischemic changes. Vascular: No hyperdense vessel or unexpected calcification. Skull: Normal. Negative for fracture or focal lesion. Other: None. CT MAXILLOFACIAL FINDINGS Osseous: No fracture or mandibular dislocation. No destructive process. Orbits: Negative. No traumatic or inflammatory finding. Sinuses: Marked mucosal thickening of the bilateral maxillary sinuses with partial opacification. Mucosal thickening of the ethmoid air cells. Remaining paranasal sinuses and mastoid air cells appear clear. Soft tissues: No significant hematoma or fluid collection. IMPRESSION: CT head: 1. No acute intracranial abnormality. 2. Mild generalized cerebral atrophy and chronic microvascular ischemic changes of the white matter. CT maxillofacial: 1. No acute facial bone fracture. 2. Bilateral maxillary and ethmoid sinus disease. Electronically Signed   By: Keane Police D.O.   On: 11/10/2022 17:33   CT Maxillofacial Wo Contrast  Result Date: 11/10/2022 CLINICAL DATA:  Seizures, new onset.  Head and facial trauma. EXAM: CT HEAD WITHOUT CONTRAST CT MAXILLOFACIAL WITHOUT CONTRAST TECHNIQUE: Multidetector CT imaging of the head and maxillofacial structures were performed using the standard protocol without intravenous contrast. Multiplanar CT image reconstructions of the maxillofacial structures were also generated. RADIATION DOSE REDUCTION: This exam was performed according to the departmental dose-optimization program which includes automated exposure control, adjustment of the mA and/or kV according to patient size and/or use of iterative reconstruction technique. COMPARISON:  None Available. FINDINGS: CT HEAD  FINDINGS Brain: No evidence of acute infarction, hemorrhage, hydrocephalus, extra-axial collection or mass lesion/mass effect. Mild generalized cerebral atrophy. Patchy area of low-attenuation of the periventricular white matter presumed chronic microvascular ischemic changes. Vascular: No hyperdense vessel or unexpected calcification. Skull: Normal. Negative for fracture or focal lesion. Other: None. CT MAXILLOFACIAL FINDINGS Osseous: No fracture or mandibular dislocation. No destructive process. Orbits: Negative. No traumatic or inflammatory finding. Sinuses: Marked mucosal thickening of the bilateral maxillary sinuses with partial opacification. Mucosal thickening of the ethmoid air cells. Remaining paranasal sinuses and mastoid air cells appear clear. Soft tissues: No significant hematoma or fluid collection. IMPRESSION: CT head: 1. No acute intracranial abnormality. 2. Mild generalized cerebral atrophy and chronic microvascular ischemic changes of the white matter. CT maxillofacial: 1. No acute facial bone fracture. 2. Bilateral maxillary and ethmoid sinus disease. Electronically Signed   By: Keane Police D.O.   On: 11/10/2022 17:33     Assessment and Plan:   Newly diagnosed atrial fibrillation with RVR: In the setting of RSV infection, noncompliance  and alcohol withdrawal seizure  - HR 120-130s during interview, primary team ordered single dose of '10mg'$  IV cardizem followed by '90mg'$  TID dosing of oral cardizemand low dose IV digoxin. This is reasonable. Will follow HR on the side. Not a good candidate for anticoagulation given EtOH abuse and noncompliance. Focus on rate control for now. CHA2DS2-Vasc score 2 (HTN and diastolic CHF)  Alcohol withdrawal seizure: Noncompliant with medication. EEG did not reveal any epileptiform activity.  Neurology seen the patient and felt the patient had alcohol withdrawal seizure in the setting of medication noncompliance.  His Tegretol level was undetectable.  According  to neurology note, they checked with his pharmacy, the last time he picked up a 30-day Tegretol prescription was in October.  RSV: per primary team, symptom has been going on for a week  Chronic diastolic heart failure: BNP 524.6, but euvolemic on exam  Hypotension: Resolved with IV hydration.  Hyperlipidemia  Thrombocytopenia: will hold off on IV heparin. Platelet 49 k   Risk Assessment/Risk Scores:      New York Heart Association (NYHA) Functional Class NYHA Class II  CHA2DS2-VASc Score = 2   This indicates a 2.2% annual risk of stroke. The patient's score is based upon: CHF History: 1 HTN History: 1 Diabetes History: 0 Stroke History: 0 Vascular Disease History: 0 Age Score: 0 Gender Score: 0     For questions or updates, please contact [place] Please consult [email or web address] for contact info under    Signed, Almyra Deforest, [place]  11/11/2022 12:25 PM  History and all data above reviewed.  Patient examined.  I agree with the findings as above.   The patient has a history of chronic diastolic HF. He hs med non compliance and ETOH use.  He came in after a seizure and is noted to have RSV and new onset atrial fib with RVR.  He reported flu like symptoms on presentation but denied this to me.  He does report chronic DOE and O2 at home.  He says that he works and gets SOB at work but is not clear whether these symptoms are worse than before.  He says he would not notice that he is in atrial fib.  The patient exam reveals KGM:[id number], no murmurs  ,  Lungs: Decreased breath sounds, no wheezing, no crackles  ,  Abd: Positive bowel sounds, no rebound no guarding, Ext No edema  .  All available labs, radiology testing, previous records reviewed. Agree with documented assessment and plan.   Atrial fib:  Rapid rate but he did slow with a dose of IV Cardizem and PO.  He has been started on an aggressive PO dose (90 Q8 hours) but his BP will tolerate this.  Also on digoxin.  He was on  heparin but with low Mr. Tylique Aull. has a CHA2DS2 - VASc, and the fact that he is not a long term DOAC candidate (with ETOH and non compliance) and with low platelets noted this has been discontinued.  We will continue with rate control.  If this is not possible before discharge then he would need TEE/DCCV with short term DOAC.  I would not suggest long term digoxin.  Dig is not absolutely contraindicated with Tegretol but levels can be affected and more difficult to manage.  I will discontinue this.    Jeneen Rinks Trynity Skousen  5:40 PM  11/11/2022

## 2022-11-11 NOTE — Procedures (Addendum)
Patient Name: Jose [name].  MRN: [id number]  Epilepsy Attending: Lora Havens  Referring Physician/Provider: Toy Baker, MD  Date: 11/11/2022 Duration: 22.04 mins  Patient history: 52yo M with h/o epilepsy came with seizure. EEG to evaluate for seizure.   Level of alertness: Awake, asleep  AEDs during EEG study: CBZ, LEV  Technical aspects: This EEG study was done with scalp electrodes positioned according to the 10-20 International system of electrode placement. Electrical activity was reviewed with band pass filter of 1-'70Hz'$ , sensitivity of 7 uV/mm, display speed of 76m/sec with a '60Hz'$  notched filter applied as appropriate. EEG data were recorded continuously and digitally stored.  Video monitoring was available and reviewed as appropriate.  Description: The posterior dominant rhythm consists of 9-10 Hz activity of moderate voltage (25-35 uV) seen predominantly in posterior head regions, symmetric and reactive to eye opening and eye closing. Sleep was characterized by vertex waves, sleep spindles (12 to 14 Hz), maximal frontocentral region.  Physiologic photic driving was not seen during photic stimulation.  Hyperventilation was not performed.     IMPRESSION: This study is within normal limits. No seizures or epileptiform discharges were seen throughout the recording.  A normal interictal EEG does not exclude the diagnosis of epilepsy.  Alquan Morrish OBarbra Sarks

## 2022-11-11 NOTE — ED Notes (Signed)
Neurology at bedside.

## 2022-11-11 NOTE — Consult Note (Addendum)
NEUROLOGY CONSULTATION NOTE   Date of service: November 11, 2022 Patient Name: Jose [name]. MRN:  [id number] DOB:  Jan 28, 1971 Reason for consult: "seizures" Requesting Provider: Toy Baker, MD _ _ _   _ __   _ __ _ _  __ __   _ __   __ _  History of Present Illness  Jose [name]. is a 52 y.o. male with PMH significant for CHF, HTN, HLD, COPD, Tobacco use, EtOH use who was found down unresponsive and hypotensive and concern for potential alcohol withdrawal seizures. He is supposed to be on tegretol but levels here and essentially undetectable and likely not taking it.  He was given ativan andwas somnolent and hard to arouse vs concern that he could have had a second seizure.  Neurology consulted for further evaluation.  Patient typically drinks a pint of moonshine a day. he had upset stomach on the second and did not have any alcohol.  He only had half a pint on the third.  He felt a seizure coming on the third with feeling very off balance and dizzy.  He had a seizure on the fourth and was found unresponsive and brought into the ED.  He does not think this is the right time for him to quit alcohol.  He will think about it later.  He reports that him cutting down on alcohol was unintentional.  He endorses taking medications at home but reports that he is on a lot of medications and so he is not sure what he is taking and what he is not.  Fill history shows he got 30-day supply of Tegretol on August 24, 2022 and has not had refills since.   ROS   Constitutional Denies weight loss, fever and chills.   HEENT Denies changes in vision and hearing.   Respiratory Denies SOB and cough.   CV Denies palpitations and CP   GI Denies abdominal pain, nausea, vomiting and diarrhea.   GU Denies dysuria and urinary frequency.   MSK Denies myalgia and joint pain.   Skin Denies rash and pruritus.   Neurological Denies headache and syncope.   Psychiatric Denies recent changes in mood. Denies  anxiety and depression.   Past History   Past Medical History:  Diagnosis Date   Alcohol abuse    Cervicalgia    CHF (congestive heart failure) (HCC)    Hyperlipidemia    Hypertension    Seizures ([place])    Past Surgical History:  Procedure Laterality Date   COLONOSCOPY WITH PROPOFOL N/A 01/16/2018   Procedure: COLONOSCOPY WITH PROPOFOL;  Surgeon: Lin Landsman, MD;  Location: [facility] ENDOSCOPY;  Service: Gastroenterology;  Laterality: N/A;   ESOPHAGOGASTRODUODENOSCOPY (EGD) WITH PROPOFOL N/A 01/16/2018   Procedure: ESOPHAGOGASTRODUODENOSCOPY (EGD) WITH PROPOFOL;  Surgeon: Lin Landsman, MD;  Location: [facility] ENDOSCOPY;  Service: Gastroenterology;  Laterality: N/A;   Family History  Problem Relation Age of Onset   Diabetes Mother    Colon cancer Mother    Hypertension Father    Multiple sclerosis Father    Social History   Socioeconomic History   Marital status: Widowed    Spouse name: Not on file   Number of children: Not on file   Years of education: Not on file   Highest education level: Not on file  Occupational History   Not on file  Tobacco Use   Smoking status: Every Day    Packs/day: 1.00    Years: 33.00    Total  pack years: 33.00    Types: Cigars, Cigarettes    Start date: 33    Last attempt to quit: 2020    Years since quitting: 4.0   Smokeless tobacco: Former   Tobacco comments:    2 cigars a day-05/05/2022    Quit smoking cigarettes 3 years ago.  Smokes cigarettes since he was 52 years old.  Vaping Use   Vaping Use: Never used  Substance and Sexual Activity   Alcohol use: Yes    Alcohol/week: 28.0 standard drinks of alcohol    Types: 7 Cans of beer, 21 Shots of liquor per week    Comment: half pint liquor per day   Drug use: No    Types: Marijuana    Comment: quit back in the late 90's   Sexual activity: Yes  Other Topics Concern   Not on file  Social History Narrative   Not on file   Social Determinants of Health   Financial  Resource Strain: Low Risk  (07/01/2019)   Overall Financial Resource Strain (CARDIA)    Difficulty of Paying Living Expenses: Not hard at all  Food Insecurity: No Food Insecurity (07/01/2019)   Hunger Vital Sign    Worried About Running Out of Food in the Last Year: Never true    Ran Out of Food in the Last Year: Never true  Transportation Needs: No Transportation Needs (07/01/2019)   PRAPARE - Hydrologist (Medical): No    Lack of Transportation (Non-Medical): No  Physical Activity: Sufficiently Active (07/01/2019)   Exercise Vital Sign    Days of Exercise per Week: 3 days    Minutes of Exercise per Session: 50 min  Stress: No Stress Concern Present (09/02/2019)   [place]    Feeling of Stress : Not at all  Social Connections: Moderately Isolated (07/01/2019)   Social Connection and Isolation Panel [NHANES]    Frequency of Communication with Friends and Family: Three times a week    Frequency of Social Gatherings with Friends and Family: Three times a week    Attends Religious Services: 1 to 4 times per year    Active Member of Clubs or Organizations: No    Attends Archivist Meetings: Never    Marital Status: Widowed   No Known Allergies  Medications  (Not in a hospital admission)    Vitals   Vitals:   11/10/22 2200 11/10/22 2345 11/11/22 0000 11/11/22 0003  BP:  133/89 (!) 126/94   Pulse:  84 (!) 41   Resp:  (!) 30 (!) 33   Temp:    98 F (36.7 C)  TempSrc:    Oral  SpO2:  95% 97%   Weight: 95 kg     Height: 5' 10.5" (1.791 m)        Body mass index is 29.63 kg/m.  Physical Exam   General: Laying comfortably in bed; in no acute distress.  HENT: Normal oropharynx and mucosa. Normal external appearance of ears and nose.  Neck: Supple, no pain or tenderness  CV: No JVD. No peripheral edema.  Pulmonary: Symmetric Chest rise. Normal respiratory effort.  Abdomen:  Soft to touch, non-tender.  Ext: No cyanosis, edema, or deformity  Skin: No rash. Normal palpation of skin.   Musculoskeletal: Normal digits and nails by inspection. No clubbing.   Neurologic Examination  Mental status/Cognition: drowsy, oriented to self, place, month and year, good attention.  Speech/language:  Fluent, comprehension intact, object naming intact, repetition intact.  Cranial nerves:   CN II Pupils equal and reactive to light, no VF deficits    CN III,IV,VI EOM intact, no gaze preference or deviation, no nystagmus   CN V normal sensation in V1, V2, and V3 segments bilaterally    CN VII no asymmetry, no nasolabial fold flattening    CN VIII normal hearing to speech    CN IX & X normal palatal elevation, no uvular deviation    CN XI 5/5 head turn and 5/5 shoulder shrug bilaterally    CN XII midline tongue protrusion    Motor:  Muscle bulk: normal, tone normal, pronator drift none tremor: has fine tremor which is likely essential tremor vs EtOh withdrawal. Mvmt Root Nerve  Muscle Right Left Comments  SA C5/6 Ax Deltoid 5 5   EF C5/6 Mc Biceps 5 5   EE C6/7/8 Rad Triceps 5 5   WF C6/7 Med FCR     WE C7/8 PIN ECU     F Ab C8/T1 U ADM/FDI 5 5   HF L1/2/3 Fem Illopsoas 4+ 4+ Pain in BL lower extremities which limit strength testing.  KE L2/3/4 Fem Quad     DF L4/5 D Peron Tib Ant 5 5   PF S1/2 Tibial Grc/Sol 5 5    Sensation:  Light touch Intact bilaterally   Pin prick    Temperature    Vibration   Proprioception    Coordination/Complex Motor:  - Finger to Nose intact bilaterally with some tremor. - Heel to shin difficult to do with pain in bilateral lower extremity but no obvious ataxia noted in bilateral lower extremities. - Rapid alternating movement are intact - Gait: Deferred for patient's safety. Labs   CBC:  Recent Labs  Lab 11/10/22 1749 11/11/22 0114  WBC 4.1  --   NEUTROABS 2.5  --   HGB 12.6* 12.9*  HCT 38.0* 38.0*  MCV 95.0  --   PLT 53*  --      Basic Metabolic Panel:  Lab Results  Component Value Date   NA 138 11/11/2022   K 3.6 11/11/2022   CO2 22 11/10/2022   GLUCOSE 107 (H) 11/10/2022   BUN 26 (H) 11/10/2022   CREATININE 0.73 11/10/2022   CALCIUM 8.2 (L) 11/10/2022   GFRNONAA >60 11/10/2022   GFRAA >60 05/16/2020   Lipid Panel:  Lab Results  Component Value Date   LDLCALC 102 (H) 12/08/2021   HgbA1c:  Lab Results  Component Value Date   HGBA1C 5.2 08/24/2022   Urine Drug Screen:     Component Value Date/Time   LABOPIA NONE DETECTED 11/10/2022 2126   COCAINSCRNUR NONE DETECTED 11/10/2022 2126   LABBENZ NONE DETECTED 11/10/2022 2126   AMPHETMU NONE DETECTED 11/10/2022 2126   THCU NONE DETECTED 11/10/2022 2126   LABBARB NONE DETECTED 11/10/2022 2126    Alcohol Level     Component Value Date/Time   ETH <10 11/10/2022 1749    CT Head without contrast(Personally reviewed): CTH was negative for a large hypodensity concerning for a large territory infarct or hyperdensity concerning for an ICH  rEEG:  pending  Impression   Billie Intriago. is a 52 y.o. male with PMH significant for CHF, HTN, HLD, COPD, Tobacco use, EtOH use(1 pint of moonshine a day) who unintentionall cut down on EtOh. No Etoh on 2nd due to upset stomach and only half a pint on 3rd. He was found down unresponsive and  hypotensive post ictal/somnolent and felt like he had a seizure. He is supposed to be on tegretol but levels here undetectable and fill history shows last dispense for 30 days supply on oct 10th 2023, likely non compliant. His neurologic examination is notable for no focal deficit.  Suspect that the etiology of his seizure is likely noncompliance with Tegretol and unintentionally cutting down on alcohol due to upset stomach.  He is not interested in quitting alcohol at this time.  Recommendations  - continue Tegretol '200mg'$  BID. - Will load with Keppra and do a Keppra bridge with '500mg'$  BID for 3 days and then stop Keppra. -  Seizure precautions and seizure pads. - Ativan 1-2 mg for seizure lasting more than 3 mins. - CIWA. - routine EEG is pending, will follow up on it. - can be discharged from neurology standpoint with outpatient follow up. - no driving for 6 months. Has to be seizure free before he can resume driving. Full seizure precautions listed below. - counseled him to come to ED or check into inpatient rehab when he is attempting to cut down on alcohol as he is high risk for seizures. - neurology will be available on an as needed basis. We do not plan to see him again inpatient unless primary team reaches out to [place] with any questions or concerns. ______________________________________________________________________   Thank you for the opportunity to take part in the care of this patient. If you have any further questions, please contact the neurology consultation attending.  Signed,  Donnetta Simpers [facility] Pager Number [phone] _ _ _   _ __   _ __ _ _  __ __   _ __   __ _  Seizure precautions: Per [facility] statutes, patients with seizures are not allowed to drive until they have been seizure-free for six months and cleared by a physician    Use caution when using heavy equipment or power tools. Avoid working on ladders or at heights. Take showers instead of baths. Ensure the water temperature is not too high on the home water heater. Do not go swimming alone. Do not lock yourself in a room alone (i.e. bathroom). When caring for infants or small children, sit down when holding, feeding, or changing them to minimize risk of injury to the child in the event you have a seizure. Maintain good sleep hygiene. Avoid alcohol.    If patient has another seizure, call 911 and bring them back to the ED if: A.  The seizure lasts longer than 5 minutes.      B.  The patient doesn't wake shortly after the seizure or has new problems such as difficulty seeing, speaking or moving  following the seizure C.  The patient was injured during the seizure D.  The patient has a temperature over 102 F (39C) E.  The patient vomited during the seizure and now is having trouble breathing    During the Seizure   - First, ensure adequate ventilation and place patients on the floor on their left side  Loosen clothing around the neck and ensure the airway is patent. If the patient is clenching the teeth, do not force the mouth open with any object as this can cause severe damage - Remove all items from the surrounding that can be hazardous. The patient may be oblivious to what's happening and may not even know what he or she is doing. If the patient is confused and wandering, either gently guide him/her away  and block access to outside areas - Reassure the individual and be comforting - Call 911. In most cases, the seizure ends before EMS arrives. However, there are cases when seizures may last over 3 to 5 minutes. Or the individual may have developed breathing difficulties or severe injuries. If a pregnant patient or a person with diabetes develops a seizure, it is prudent to call an ambulance. - Finally, if the patient does not regain full consciousness, then call EMS. Most patients will remain confused for about 45 to 90 minutes after a seizure, so you must use judgment in calling for help. - Avoid restraints but make sure the patient is in a bed with padded side rails - Place the individual in a lateral position with the neck slightly flexed; this will help the saliva drain from the mouth and prevent the tongue from falling backward - Remove all nearby furniture and other hazards from the area - Provide verbal assurance as the individual is regaining consciousness - Provide the patient with privacy if possible - Call for help and start treatment as ordered by the caregiver    After the Seizure (Postictal Stage)   After a seizure, most patients experience confusion, fatigue, muscle  pain and/or a headache. Thus, one should permit the individual to sleep. For the next few days, reassurance is essential. Being calm and helping reorient the person is also of importance.   Most seizures are painless and end spontaneously. Seizures are not harmful to others but can lead to complications such as stress on the lungs, brain and the heart. Individuals with prior lung problems may develop labored breathing and respiratory distress.

## 2022-11-11 NOTE — Progress Notes (Signed)
ANTICOAGULATION CONSULT NOTE - Follow Up Consult  Pharmacy Consult for heparin Indication: atrial fibrillation  Labs: Recent Labs    11/10/22 1749 11/11/22 0004 11/11/22 0114 11/11/22 0352  HGB 12.6*  --  12.9* 11.7*  HCT 38.0*  --  38.0* 34.1*  PLT 53*  --   --  52*  LABPROT  --  14.8  --   --   INR  --  1.2  --   --   HEPARINUNFRC  --   --   --  <0.10*  CREATININE 0.73  --   --   --   CKTOTAL  --  131  --   --   TROPONINIHS  --  8  --  8    Assessment: 52yo male subtherapeutic on heparin with initial dosing for Afib; no infusion issues or signs of bleeding per RN; Plt low but stable.  Goal of Therapy:  Heparin level 0.3-0.7 units/ml   Plan:  Will increase heparin infusion cautiously by 2 units/kg/hr to 1400 units/hr and check level in 6 hours.    Wynona Neat, PharmD, BCPS  11/11/2022,5:59 AM

## 2022-11-11 NOTE — ED Notes (Signed)
Messaged pharmacy about sending magnesium

## 2022-11-11 NOTE — ED Notes (Signed)
ED TO INPATIENT HANDOFF REPORT  ED Nurse Name and Phone #: [phone]  S Name/Age/Gender Jose [name]. 52 y.o. male Room/Bed: 005C/005C  Code Status   Code Status: Full Code  Home/SNF/Other Home Patient oriented to: self, place, time, and situation Is this baseline? Yes   Triage Complete: Triage complete  Chief Complaint Seizure disorder [facility]) [X44.818]  Triage Note Patient with history of seizures BIB [facility] for evaluation after a seizure. Patient post ictal with EMS, progressively becoming more oriented en route. BP reported 60/40 with EMS. Patient alert, oriented, and in no apparent distress at this time. BP 120/85.   Allergies No Known Allergies  Level of Care/Admitting Diagnosis ED Disposition     ED Disposition  Admit   Condition  --   Comment  Hospital Area: [place] [[id number]]  Level of Care: Progressive [102]  Admit to Progressive based on following criteria: CARDIOVASCULAR & THORACIC of moderate stability with acute coronary syndrome symptoms/low risk myocardial infarction/hypertensive urgency/arrhythmias/heart failure potentially compromising stability and stable post cardiovascular intervention patients.  May admit patient to [facility] or [facility] if equivalent level of care is available:: No  Covid Evaluation: Asymptomatic - no recent exposure (last 10 days) testing not required  Diagnosis: Seizure disorder [facility]) [[id number]]  Admitting Physician: Toy Baker [[id number]]  Attending Physician: Thurnell Lose [[id number]]  Certification:: I certify this patient will need inpatient services for at least 2 midnights  Estimated Length of Stay: 2          B Medical/Surgery History Past Medical History:  Diagnosis Date   Alcohol abuse    Cervicalgia    CHF (congestive heart failure) ([place])    Hyperlipidemia    Hypertension    Seizures ([place])    Past Surgical History:  Procedure Laterality Date   COLONOSCOPY WITH PROPOFOL N/A  01/16/2018   Procedure: COLONOSCOPY WITH PROPOFOL;  Surgeon: Lin Landsman, MD;  Location: [facility] ENDOSCOPY;  Service: Gastroenterology;  Laterality: N/A;   ESOPHAGOGASTRODUODENOSCOPY (EGD) WITH PROPOFOL N/A 01/16/2018   Procedure: ESOPHAGOGASTRODUODENOSCOPY (EGD) WITH PROPOFOL;  Surgeon: Lin Landsman, MD;  Location: [facility] ENDOSCOPY;  Service: Gastroenterology;  Laterality: N/A;     A IV Location/Drains/Wounds Patient Lines/Drains/Airways Status     Active Line/Drains/Airways     Name Placement date Placement time Site Days   Peripheral IV 11/10/22 18 G Anterior;Distal;Left;Upper Arm 11/10/22  1753  Arm  1   Peripheral IV 11/11/22 20 G Posterior;Right Hand 11/11/22  0350  Hand  less than 1            Intake/Output Last 24 hours  Intake/Output Summary (Last 24 hours) at 11/11/2022 1702 Last data filed at 11/11/2022 1541 Gross per 24 hour  Intake 927.41 ml  Output --  Net 927.41 ml    Labs/Imaging Results for orders placed or performed during the hospital encounter of 11/10/22 (from the past 48 hour(s))  CBC with Differential     Status: Abnormal   Collection Time: 11/10/22  5:49 PM  Result Value Ref Range   WBC 4.1 4.0 - 10.5 K/uL   RBC 4.00 (L) 4.22 - 5.81 MIL/uL   Hemoglobin 12.6 (L) 13.0 - 17.0 g/dL   HCT 38.0 (L) 39.0 - 52.0 %   MCV 95.0 80.0 - 100.0 fL   MCH 31.5 26.0 - 34.0 pg   MCHC 33.2 30.0 - 36.0 g/dL   RDW 15.4 11.5 - 15.5 %   Platelets 53 (L) 150 - 400 K/uL  Comment: Immature Platelet Fraction may be clinically indicated, consider ordering this additional test [id number] REPEATED TO VERIFY    nRBC 0.5 (H) 0.0 - 0.2 %   Neutrophils Relative % 60 %   Neutro Abs 2.5 1.7 - 7.7 K/uL   Lymphocytes Relative 25 %   Lymphs Abs 1.0 0.7 - 4.0 K/uL   Monocytes Relative 12 %   Monocytes Absolute 0.5 0.1 - 1.0 K/uL   Eosinophils Relative 1 %   Eosinophils Absolute 0.0 0.0 - 0.5 K/uL   Basophils Relative 1 %   Basophils Absolute 0.0 0.0 - 0.1 K/uL    Immature Granulocytes 1 %   Abs Immature Granulocytes 0.02 0.00 - 0.07 K/uL    Comment: Performed at [facility] [address], [place], [place] [address]  Comprehensive metabolic panel     Status: Abnormal   Collection Time: 11/10/22  5:49 PM  Result Value Ref Range   Sodium 137 135 - 145 mmol/L   Potassium 4.2 3.5 - 5.1 mmol/L   Chloride 101 98 - 111 mmol/L   CO2 22 22 - 32 mmol/L   Glucose, Bld 107 (H) 70 - 99 mg/dL    Comment: Glucose reference range applies only to samples taken after fasting for at least 8 hours.   BUN 26 (H) 6 - 20 mg/dL   Creatinine, Ser 0.73 0.61 - 1.24 mg/dL   Calcium 8.2 (L) 8.9 - 10.3 mg/dL   Total Protein 5.8 (L) 6.5 - 8.1 g/dL   Albumin 3.1 (L) 3.5 - 5.0 g/dL   AST 93 (H) 15 - 41 U/L   ALT 48 (H) 0 - 44 U/L   Alkaline Phosphatase 103 38 - 126 U/L   Total Bilirubin 1.0 0.3 - 1.2 mg/dL   GFR, Estimated >60 >60 mL/min    Comment: (NOTE) Calculated using the CKD-EPI Creatinine Equation (2021)    Anion gap 14 5 - 15    Comment: Performed at [facility], [place] [address] [place], [place] [address]  Ethanol     Status: None   Collection Time: 11/10/22  5:49 PM  Result Value Ref Range   Alcohol, Ethyl (B) <10 <10 mg/dL    Comment: (NOTE) Lowest detectable limit for serum alcohol is 10 mg/dL.  For medical purposes only. Performed at [facility], [place] [address], [place], [place] [address]   Carbamazepine level, total     Status: Abnormal   Collection Time: 11/10/22  5:49 PM  Result Value Ref Range   Carbamazepine Lvl <2.0 (L) 4.0 - 12.0 ug/mL    Comment: Performed at [facility] [address], [place], [place] [address]  CBG monitoring, ED     Status: Abnormal   Collection Time: 11/10/22  8:49 PM  Result Value Ref Range   Glucose-Capillary 103 (H) 70 - 99 mg/dL    Comment: Glucose reference range applies only to samples taken after fasting for at least 8 hours.  Rapid urine drug screen (hospital performed)     Status:  None   Collection Time: 11/10/22  9:26 PM  Result Value Ref Range   Opiates NONE DETECTED NONE DETECTED   Cocaine NONE DETECTED NONE DETECTED   Benzodiazepines NONE DETECTED NONE DETECTED   Amphetamines NONE DETECTED NONE DETECTED   Tetrahydrocannabinol NONE DETECTED NONE DETECTED   Barbiturates NONE DETECTED NONE DETECTED    Comment: (NOTE) DRUG SCREEN FOR MEDICAL PURPOSES ONLY.  IF CONFIRMATION IS NEEDED FOR ANY PURPOSE, NOTIFY  LAB WITHIN 5 DAYS.  LOWEST DETECTABLE LIMITS FOR URINE DRUG SCREEN Drug Class                     Cutoff (ng/mL) Amphetamine and metabolites    1000 Barbiturate and metabolites    200 Benzodiazepine                 200 Opiates and metabolites        300 Cocaine and metabolites        300 THC                            50 Performed at [facility], [place] [address], [place], [place] [address]   Ammonia     Status: Abnormal   Collection Time: 11/11/22 12:04 AM  Result Value Ref Range   Ammonia 39 (H) 9 - 35 umol/L    Comment: Performed at [facility], [place] [address] [place], [place] [address]  CK     Status: None   Collection Time: 11/11/22 12:04 AM  Result Value Ref Range   Total CK 131 49 - 397 U/L    Comment: Performed at [facility], [place] [address], [place], [place] [address]  Creatinine, urine, random     Status: None   Collection Time: 11/11/22 12:04 AM  Result Value Ref Range   Creatinine, Urine 80 mg/dL    Comment: Performed at [facility] [address], [place], [place] [address]  Lactic acid, plasma     Status: None   Collection Time: 11/11/22 12:04 AM  Result Value Ref Range   Lactic Acid, Venous 0.9 0.5 - 1.9 mmol/L    Comment: Performed at [facility] [address], [place], [place] [address]  Magnesium     Status: Abnormal   Collection Time: 11/11/22 12:04 AM  Result Value Ref Range   Magnesium 1.0 (L) 1.7 - 2.4 mg/dL    Comment: Performed at [facility] [address]  [place], [place] [address]  Osmolality     Status: None   Collection Time: 11/11/22 12:04 AM  Result Value Ref Range   Osmolality 284 275 - 295 mOsm/kg    Comment: REPEATED TO VERIFY Performed at [facility], [place] [place], [place] [address]   Osmolality, urine     Status: None   Collection Time: 11/11/22 12:04 AM  Result Value Ref Range   Osmolality, Ur 590 300 - 900 mOsm/kg    Comment: RESULT REPEATED AND VERIFIED Performed at [facility], [address] [place], [place] [address]   Phosphorus     Status: None   Collection Time: 11/11/22 12:04 AM  Result Value Ref Range   Phosphorus 3.1 2.5 - 4.6 mg/dL    Comment: Performed at [facility], [place] [address] [place], [place] [address]  Protime-INR     Status: None   Collection Time: 11/11/22 12:04 AM  Result Value Ref Range   Prothrombin Time 14.8 11.4 - 15.2 seconds   INR 1.2 0.8 - 1.2    Comment: (NOTE) INR goal varies based on device and disease states. Performed at [facility], [place] [address] [place], [place] [address]   Troponin I (High Sensitivity)     Status: None   Collection Time: 11/11/22 12:04 AM  Result Value Ref Range   Troponin  I (High Sensitivity) 8 <18 ng/L    Comment: (NOTE) Elevated high sensitivity troponin I (hsTnI) values and significant  changes across serial measurements may suggest ACS but many other  chronic and acute conditions are known to elevate hsTnI results.  Refer to the "Links" section for chest pain algorithms and additional  guidance. Performed at [facility], [place] [address], [place], [place] [address]   TSH     Status: None   Collection Time: 11/11/22 12:04 AM  Result Value Ref Range   TSH 1.436 0.350 - 4.500 uIU/mL    Comment: Performed by a 3rd Generation assay with a functional sensitivity of <=0.01 uIU/mL. Performed at [facility], [place] [address] [place], [place] [address]   Urinalysis, Complete w Microscopic Anterior  Nasal Swab     Status: Abnormal   Collection Time: 11/11/22 12:04 AM  Result Value Ref Range   Color, Urine YELLOW YELLOW   APPearance CLEAR CLEAR   Specific Gravity, Urine 1.015 1.005 - 1.030   pH 6.0 5.0 - 8.0   Glucose, UA NEGATIVE NEGATIVE mg/dL   Hgb urine dipstick NEGATIVE NEGATIVE   Bilirubin Urine NEGATIVE NEGATIVE   Ketones, ur NEGATIVE NEGATIVE mg/dL   Protein, ur 30 (A) NEGATIVE mg/dL   Nitrite NEGATIVE NEGATIVE   Leukocytes,Ua NEGATIVE NEGATIVE   RBC / HPF 0-5 0 - 5 RBC/hpf   WBC, UA 0-5 0 - 5 WBC/hpf   Bacteria, UA NONE SEEN NONE SEEN   Squamous Epithelial / HPF 0-5 0 - 5 /HPF   Mucus PRESENT    Hyaline Casts, UA PRESENT     Comment: Performed at [facility], [place] [address], [place],  [address]  Resp panel by RT-PCR (RSV, Flu A&B, Covid) Anterior Nasal Swab     Status: Abnormal   Collection Time: 11/11/22 12:23 AM   Specimen: Anterior Nasal Swab  Result Value Ref Range   SARS Coronavirus 2 by RT PCR NEGATIVE NEGATIVE    Comment: (NOTE) SARS-CoV-2 target nucleic acids are NOT DETECTED.  The SARS-CoV-2 RNA is generally detectable in upper respiratory specimens during the acute phase of infection. The lowest concentration of SARS-CoV-2 viral copies this assay can detect is 138 copies/mL. A negative result does not preclude SARS-Cov-2 infection and should not be used as the sole basis for treatment or other patient management decisions. A negative result may occur with  improper specimen collection/handling, submission of specimen other than nasopharyngeal swab, presence of viral mutation(s) within the areas targeted by this assay, and inadequate number of viral copies(<138 copies/mL). A negative result must be combined with clinical observations, patient history, and epidemiological information. The expected result is Negative.  Fact Sheet for Patients:  EntrepreneurPulse.com.au  Fact Sheet for Healthcare Providers:   IncredibleEmployment.be  This test is no t yet approved or cleared by the [place] FDA and  has been authorized for detection and/or diagnosis of SARS-CoV-2 by FDA under an Emergency Use Authorization (EUA). This EUA will remain  in effect (meaning this test can be used) for the duration of the COVID-19 declaration under Section 564(b)(1) of the Act, 21 U.S.C.section 360bbb-3(b)(1), unless the authorization is terminated  or revoked sooner.       Influenza A by PCR NEGATIVE NEGATIVE   Influenza B by PCR NEGATIVE NEGATIVE    Comment: (NOTE) The Xpert Xpress SARS-CoV-2/FLU/RSV plus assay is intended as an aid in the diagnosis of influenza from Nasopharyngeal swab specimens and should not be used  as a sole basis for treatment. Nasal washings and aspirates are unacceptable for Xpert Xpress SARS-CoV-2/FLU/RSV testing.  Fact Sheet for Patients: EntrepreneurPulse.com.au  Fact Sheet for Healthcare Providers: IncredibleEmployment.be  This test is not yet approved or cleared by the [place] FDA and has been authorized for detection and/or diagnosis of SARS-CoV-2 by FDA under an Emergency Use Authorization (EUA). This EUA will remain in effect (meaning this test can be used) for the duration of the COVID-19 declaration under Section 564(b)(1) of the Act, 21 U.S.C. section 360bbb-3(b)(1), unless the authorization is terminated or revoked.     Resp Syncytial Virus by PCR POSITIVE (A) NEGATIVE    Comment: (NOTE) Fact Sheet for Patients: EntrepreneurPulse.com.au  Fact Sheet for Healthcare Providers: IncredibleEmployment.be  This test is not yet approved or cleared by the [place] FDA and has been authorized for detection and/or diagnosis of SARS-CoV-2 by FDA under an Emergency Use Authorization (EUA). This EUA will remain in effect (meaning this test can be used) for the duration  of the COVID-19 declaration under Section 564(b)(1) of the Act, 21 U.S.C. section 360bbb-3(b)(1), unless the authorization is terminated or revoked.  Performed at [facility], [place] [address], [place], [place] [address]   Blood gas, venous     Status: Abnormal   Collection Time: 11/11/22  1:00 AM  Result Value Ref Range   pH, Ven 7.38 7.25 - 7.43   pCO2, Ven 43 (L) 44 - 60 mmHg   pO2, Ven 61 (H) 32 - 45 mmHg   Bicarbonate 25.4 20.0 - 28.0 mmol/L   Acid-Base Excess 0.0 0.0 - 2.0 mmol/L   O2 Saturation 90.5 %   Patient temperature 37.0    Collection site VEIN    Drawn by DRN     Comment: Performed at [facility], [place] [address], [place], [place] [address]  I-Stat venous blood gas, ED     Status: Abnormal   Collection Time: 11/11/22  1:14 AM  Result Value Ref Range   pH, Ven 7.357 7.25 - 7.43   pCO2, Ven 41.2 (L) 44 - 60 mmHg   pO2, Ven 127 (H) 32 - 45 mmHg   Bicarbonate 23.1 20.0 - 28.0 mmol/L   TCO2 24 22 - 32 mmol/L   O2 Saturation 99 %   Acid-base deficit 2.0 0.0 - 2.0 mmol/L   Sodium 138 135 - 145 mmol/L   Potassium 3.6 3.5 - 5.1 mmol/L   Calcium, Ion 1.07 (L) 1.15 - 1.40 mmol/L   HCT 38.0 (L) 39.0 - 52.0 %   Hemoglobin 12.9 (L) 13.0 - 17.0 g/dL   Sample type VENOUS   Heparin level (unfractionated)     Status: Abnormal   Collection Time: 11/11/22  3:52 AM  Result Value Ref Range   Heparin Unfractionated <0.10 (L) 0.30 - 0.70 IU/mL    Comment: (NOTE) The clinical reportable range upper limit is being lowered to >1.10 to align with the FDA approved guidance for the current laboratory assay.  If heparin results are below expected values, and patient dosage has  been confirmed, suggest follow up testing of antithrombin III levels. Performed at [facility], [place] [address], [place] [address]   CBC     Status: Abnormal   Collection Time: 11/11/22  3:52 AM  Result Value Ref Range   WBC 4.5 4.0 - 10.5 K/uL   RBC 3.66 (L) 4.22 - 5.81 MIL/uL    Hemoglobin 11.7 (L) 13.0 - 17.0 g/dL  HCT 34.1 (L) 39.0 - 52.0 %   MCV 93.2 80.0 - 100.0 fL   MCH 32.0 26.0 - 34.0 pg   MCHC 34.3 30.0 - 36.0 g/dL   RDW 15.4 11.5 - 15.5 %   Platelets 52 (L) 150 - 400 K/uL    Comment: Immature Platelet Fraction may be clinically indicated, consider ordering this additional test [id number] REPEATED TO VERIFY    nRBC 0.0 0.0 - 0.2 %    Comment: Performed at [facility], [place] [address], [place], [place] [address]  Hepatitis panel, acute     Status: None   Collection Time: 11/11/22  3:52 AM  Result Value Ref Range   Hepatitis B Surface Ag NON REACTIVE NON REACTIVE   HCV Ab NON REACTIVE NON REACTIVE    Comment: (NOTE) Nonreactive HCV antibody screen is consistent with no HCV infections,  unless recent infection is suspected or other evidence exists to indicate HCV infection.     Hep A IgM NON REACTIVE NON REACTIVE   Hep B C IgM NON REACTIVE NON REACTIVE    Comment: Performed at [facility], [place] [address], [place], [place] [address]  Prealbumin     Status: None   Collection Time: 11/11/22  3:52 AM  Result Value Ref Range   Prealbumin 21 18 - 38 mg/dL    Comment: Performed at [facility] [address] [place], [place] [address]  Type and screen [place]     Status: None   Collection Time: 11/11/22  3:52 AM  Result Value Ref Range   ABO/RH(D) O POS    Antibody Screen NEG    Sample Expiration      11/14/2022,2359 Performed at [facility], [place] [address], [place], [place] [address]   Troponin I (High Sensitivity)     Status: None   Collection Time: 11/11/22  3:52 AM  Result Value Ref Range   Troponin I (High Sensitivity) 8 <18 ng/L    Comment: (NOTE) Elevated high sensitivity troponin I (hsTnI) values and significant  changes across serial measurements may suggest ACS but many other  chronic and acute conditions are known to elevate hsTnI results.  Refer to the "Links" section for chest  pain algorithms and additional  guidance. Performed at [facility], [place] [address] [place], [place] [address]   ABO/Rh     Status: None   Collection Time: 11/11/22  3:53 AM  Result Value Ref Range   ABO/RH(D)      O POS Performed at [facility] [address], [place], [place] [address]   Lactic acid, plasma     Status: None   Collection Time: 11/11/22  4:29 AM  Result Value Ref Range   Lactic Acid, Venous 0.8 0.5 - 1.9 mmol/L    Comment: Performed at [facility] [address] [place], [place] [address]  Heparin level (unfractionated)     Status: Abnormal   Collection Time: 11/11/22  5:28 AM  Result Value Ref Range   Heparin Unfractionated <0.10 (L) 0.30 - 0.70 IU/mL    Comment: (NOTE) The clinical reportable range upper limit is being lowered to >1.10 to align with the FDA approved guidance for the current laboratory assay.  If heparin results are below expected values, and patient dosage has  been confirmed, suggest follow up testing of antithrombin III levels. Performed at [facility], [place] [address], [place], [place]  [address]   CBC     Status: Abnormal   Collection Time: 11/11/22  6:30 AM  Result Value Ref Range   WBC 4.2 4.0 - 10.5 K/uL   RBC 3.60 (L) 4.22 - 5.81 MIL/uL   Hemoglobin 11.6 (L) 13.0 - 17.0 g/dL   HCT 33.6 (L) 39.0 - 52.0 %   MCV 93.3 80.0 - 100.0 fL   MCH 32.2 26.0 - 34.0 pg   MCHC 34.5 30.0 - 36.0 g/dL   RDW 15.5 11.5 - 15.5 %   Platelets 49 (L) 150 - 400 K/uL    Comment: Immature Platelet Fraction may be clinically indicated, consider ordering this additional test [id number] REPEATED TO VERIFY    nRBC 0.0 0.0 - 0.2 %    Comment: Performed at [facility], [place] [address] [place], [place] [address]  Basic metabolic panel     Status: Abnormal   Collection Time: 11/11/22  6:30 AM  Result Value Ref Range   Sodium 136 135 - 145 mmol/L   Potassium 3.8 3.5 - 5.1 mmol/L   Chloride 103 98 - 111 mmol/L   CO2 24  22 - 32 mmol/L   Glucose, Bld 81 70 - 99 mg/dL    Comment: Glucose reference range applies only to samples taken after fasting for at least 8 hours.   BUN 18 6 - 20 mg/dL   Creatinine, Ser 0.69 0.61 - 1.24 mg/dL   Calcium 7.6 (L) 8.9 - 10.3 mg/dL   GFR, Estimated >60 >60 mL/min    Comment: (NOTE) Calculated using the CKD-EPI Creatinine Equation (2021)    Anion gap 9 5 - 15    Comment: Performed at [facility] [address] [place], [place] [address]  Brain natriuretic peptide     Status: Abnormal   Collection Time: 11/11/22  6:30 AM  Result Value Ref Range   B Natriuretic Peptide 524.6 (H) 0.0 - 100.0 pg/mL    Comment: Performed at [facility] [address], [place], [place] [address]   ECHOCARDIOGRAM COMPLETE  Result Date: 11/11/2022    ECHOCARDIOGRAM REPORT   Patient Name:   Jessee Mezera. Date of Exam: 11/11/2022 Medical Rec #:  [id number]      Height:       70.5 in Accession #:    [id number]     Weight:       209.4 lb Date of Birth:  01-10-1971       BSA:          2.140 m Patient Age:    2 years       BP:           126/99 mmHg Patient Gender: M              HR:           115 bpm. Exam Location:  Inpatient Procedure: 2D Echo, Cardiac Doppler and Color Doppler Indications:    I48.91* Unspeicified atrial fibrillation  History:        Patient has prior history of Echocardiogram examinations, most                 recent 11/05/2021. CHF; Risk Factors:Hypertension, Dyslipidemia                 and ETOH.  Sonographer:    Bernadene Person RDCS Referring Phys: Toy Baker IMPRESSIONS  1. LVEF challenging with afib/ PVC. Left ventricular ejection fraction, by estimation, is 50 to  55%. The left ventricle has low normal function. The left ventricle has no regional wall motion abnormalities. There is mild left ventricular hypertrophy. Left ventricular diastolic parameters are indeterminate.  2. Right ventricular systolic function is normal. The right ventricular size is normal. There is  mildly elevated pulmonary artery systolic pressure.  3. Left atrial size was moderately dilated.  4. Right atrial size was moderately dilated.  5. Trivial mitral valve regurgitation.  6. The aortic valve is tricuspid. Aortic valve regurgitation is not visualized.  7. There is mild dilatation of the ascending aorta, measuring 39 mm.  8. The inferior vena cava is normal in size with greater than 50% respiratory variability, suggesting right atrial pressure of 3 mmHg. FINDINGS  Left Ventricle: LVEF challenging with afib/ PVC. Left ventricular ejection fraction, by estimation, is 50 to 55%. The left ventricle has low normal function. The left ventricle has no regional wall motion abnormalities. The left ventricular internal cavity size was normal in size. There is mild left ventricular hypertrophy. Left ventricular diastolic parameters are indeterminate. Right Ventricle: The right ventricular size is normal. Right ventricular systolic function is normal. There is mildly elevated pulmonary artery systolic pressure. The tricuspid regurgitant velocity is 2.87 m/s, and with an assumed right atrial pressure of 8 mmHg, the estimated right ventricular systolic pressure is 76.1 mmHg. Left Atrium: Left atrial size was moderately dilated. Right Atrium: Right atrial size was moderately dilated. Pericardium: There is no evidence of pericardial effusion. Mitral Valve: Trivial mitral valve regurgitation. Tricuspid Valve: Tricuspid valve regurgitation is mild. Aortic Valve: The aortic valve is tricuspid. Aortic valve regurgitation is not visualized. Aortic regurgitation PHT measures 388 msec. Pulmonic Valve: Pulmonic valve regurgitation is not visualized. Aorta: There is mild dilatation of the ascending aorta, measuring 39 mm. Venous: The inferior vena cava is normal in size with greater than 50% respiratory variability, suggesting right atrial pressure of 3 mmHg. IAS/Shunts: The interatrial septum was not well visualized.  LEFT  VENTRICLE PLAX 2D LVIDd:         5.00 cm LVIDs:         3.50 cm LV PW:         1.10 cm LV IVS:        1.00 cm LVOT diam:     2.50 cm LV SV:         53 LV SV Index:   25 LVOT Area:     4.91 cm  LV Volumes (MOD) LV vol d, MOD A2C: 135.0 ml LV vol d, MOD A4C: 136.0 ml LV vol s, MOD A2C: 79.7 ml LV vol s, MOD A4C: 69.7 ml LV SV MOD A2C:     55.3 ml LV SV MOD A4C:     136.0 ml LV SV MOD BP:      62.5 ml RIGHT VENTRICLE TAPSE (M-mode): 1.8 cm LEFT ATRIUM              Index        RIGHT ATRIUM           Index LA diam:        4.20 cm  1.96 cm/m   RA Area:     26.40 cm LA Vol (A2C):   93.7 ml  43.79 ml/m  RA Volume:   88.80 ml  41.50 ml/m LA Vol (A4C):   101.0 ml 47.20 ml/m LA Biplane Vol: 98.5 ml  46.03 ml/m  AORTIC VALVE LVOT Vmax:   67.42 cm/s LVOT Vmean:  45.950 cm/s LVOT VTI:  0.108 m AI PHT:      388 msec  AORTA Ao Root diam: 4.40 cm Ao Asc diam:  3.90 cm TRICUSPID VALVE TR Peak grad:   32.9 mmHg TR Vmax:        287.00 cm/s  SHUNTS Systemic VTI:  0.11 m Systemic Diam: 2.50 cm Phineas Inches Electronically signed by Phineas Inches Signature Date/Time: 11/11/2022/11:18:12 AM    Final    EEG adult  Result Date: 11/11/2022 Lora Havens, MD     11/11/2022  8:36 AM Patient Name: Jose [name]. MRN: [id number] Epilepsy Attending: Lora Havens Referring Physician/Provider: Toy Baker, MD Date: 11/11/2022 Duration: 22.04 mins Patient history: 52yo M with h/o epilepsy came with seizure. EEG to evaluate for seizure. Level of alertness: Awake, asleep AEDs during EEG study: CBZ, LEV Technical aspects: This EEG study was done with scalp electrodes positioned according to the 10-20 International system of electrode placement. Electrical activity was reviewed with band pass filter of 1-'70Hz'$ , sensitivity of 7 uV/mm, display speed of 48m/sec with a '60Hz'$  notched filter applied as appropriate. EEG data were recorded continuously and digitally stored.  Video monitoring was available and reviewed as appropriate.  Description: The posterior dominant rhythm consists of 9-10 Hz activity of moderate voltage (25-35 uV) seen predominantly in posterior head regions, symmetric and reactive to eye opening and eye closing. Sleep was characterized by vertex waves, sleep spindles (12 to 14 Hz), maximal frontocentral region.  Physiologic photic driving was not seen during photic stimulation.  Hyperventilation was not performed.   IMPRESSION: This study is within normal limits. No seizures or epileptiform discharges were seen throughout the recording. A normal interictal EEG does not exclude the diagnosis of epilepsy. Priyanka OBarbra Sarks  [place]dom[place] Limited RUQ (LIVER/GB)  Result Date: 11/11/2022 CLINICAL DATA:  Elevated liver function tests. EXAM: ULTRASOUND ABDOMEN LIMITED RIGHT UPPER QUADRANT COMPARISON:  None Available. FINDINGS: Gallbladder: No gallstones or wall thickening visualized (2.4 mm). No sonographic Murphy sign noted by sonographer. Common bile duct: Diameter: 2.8 mm Liver: No focal lesion identified. Diffusely increased echogenicity of the liver parenchyma is noted. Portal vein is patent on color Doppler imaging with normal direction of blood flow towards the liver. Other: None. IMPRESSION: Hepatic steatosis without focal liver lesions. Electronically Signed   By: TVirgina NorfolkM.D.   On: 11/11/2022 02:14   CT HEAD WO CONTRAST (5MM)  Result Date: 11/11/2022 CLINICAL DATA:  Status post seizure. EXAM: CT HEAD WITHOUT CONTRAST TECHNIQUE: Contiguous axial images were obtained from the base of the skull through the vertex without intravenous contrast. RADIATION DOSE REDUCTION: This exam was performed according to the departmental dose-optimization program which includes automated exposure control, adjustment of the mA and/or kV according to patient size and/or use of iterative reconstruction technique. COMPARISON:  November 10, 2022 FINDINGS: Brain: There is mild cerebral atrophy with widening of the extra-axial spaces  and ventricular dilatation. There are areas of decreased attenuation within the white matter tracts of the supratentorial brain, consistent with microvascular disease changes. Vascular: There is mild to moderate severity calcification of the bilateral cavernous carotid arteries. Skull: Small, bilateral nondisplaced nasal bone fractures are seen. These are of indeterminate age. Sinuses/Orbits: There is marked severity bilateral maxillary sinus and bilateral ethmoid sinus mucosal thickening. Other: None. IMPRESSION: 1. No acute intracranial abnormality. 2. Generalized cerebral atrophy with chronic white matter small vessel ischemic changes. 3. Marked severity bilateral maxillary sinus and bilateral ethmoid sinus disease. Electronically Signed   By: TVirgina Norfolk  M.D.   On: 11/11/2022 01:38   DG Knee 1-2 Views Right  Result Date: 11/11/2022 CLINICAL DATA:  Recent fall with knee pain, initial encounter EXAM: RIGHT KNEE - 2 VIEW COMPARISON:  07/09/2021 FINDINGS: No acute fracture or dislocation is noted. Degenerative changes of the knee joint are seen. Mild soft tissue swelling is noted in the suprapatellar region likely related to the recent injury. IMPRESSION: Degenerative change without acute bony abnormality. Electronically Signed   By: Inez Catalina M.D.   On: 11/11/2022 01:21   DG CHEST PORT 1 VIEW  Result Date: 11/11/2022 CLINICAL DATA:  Syncope. EXAM: PORTABLE CHEST 1 VIEW COMPARISON:  None Available. FINDINGS: The cardiac silhouette is enlarged and unchanged in size. There is moderate severity calcification of the aortic arch. Mild, bilateral perihilar atelectatic changes are seen. There is no evidence of a pleural effusion or pneumothorax. The visualized skeletal structures are unremarkable. IMPRESSION: Cardiomegaly with mild bilateral perihilar atelectatic changes. Electronically Signed   By: Virgina Norfolk M.D.   On: 11/11/2022 00:28   DG Lumbar Spine Complete  Result Date: 11/10/2022 CLINICAL  DATA:  Fall. EXAM: LUMBAR SPINE - COMPLETE 4+ VIEW COMPARISON:  None Available. FINDINGS: No fracture or traumatic malalignment. Multilevel degenerative disc disease, most prominent in the lower lumbar spine. Lower lumbar facet degenerative changes. No other bony or soft tissue abnormalities identified. IMPRESSION: No fracture or traumatic malalignment. Degenerative changes as above. Electronically Signed   By: Dorise Bullion III M.D.   On: 11/10/2022 17:51   CT Head Wo Contrast  Result Date: 11/10/2022 CLINICAL DATA:  Seizures, new onset.  Head and facial trauma. EXAM: CT HEAD WITHOUT CONTRAST CT MAXILLOFACIAL WITHOUT CONTRAST TECHNIQUE: Multidetector CT imaging of the head and maxillofacial structures were performed using the standard protocol without intravenous contrast. Multiplanar CT image reconstructions of the maxillofacial structures were also generated. RADIATION DOSE REDUCTION: This exam was performed according to the departmental dose-optimization program which includes automated exposure control, adjustment of the mA and/or kV according to patient size and/or use of iterative reconstruction technique. COMPARISON:  None Available. FINDINGS: CT HEAD FINDINGS Brain: No evidence of acute infarction, hemorrhage, hydrocephalus, extra-axial collection or mass lesion/mass effect. Mild generalized cerebral atrophy. Patchy area of low-attenuation of the periventricular white matter presumed chronic microvascular ischemic changes. Vascular: No hyperdense vessel or unexpected calcification. Skull: Normal. Negative for fracture or focal lesion. Other: None. CT MAXILLOFACIAL FINDINGS Osseous: No fracture or mandibular dislocation. No destructive process. Orbits: Negative. No traumatic or inflammatory finding. Sinuses: Marked mucosal thickening of the bilateral maxillary sinuses with partial opacification. Mucosal thickening of the ethmoid air cells. Remaining paranasal sinuses and mastoid air cells appear clear.  Soft tissues: No significant hematoma or fluid collection. IMPRESSION: CT head: 1. No acute intracranial abnormality. 2. Mild generalized cerebral atrophy and chronic microvascular ischemic changes of the white matter. CT maxillofacial: 1. No acute facial bone fracture. 2. Bilateral maxillary and ethmoid sinus disease. Electronically Signed   By: Keane Police D.O.   On: 11/10/2022 17:33   CT Maxillofacial Wo Contrast  Result Date: 11/10/2022 CLINICAL DATA:  Seizures, new onset.  Head and facial trauma. EXAM: CT HEAD WITHOUT CONTRAST CT MAXILLOFACIAL WITHOUT CONTRAST TECHNIQUE: Multidetector CT imaging of the head and maxillofacial structures were performed using the standard protocol without intravenous contrast. Multiplanar CT image reconstructions of the maxillofacial structures were also generated. RADIATION DOSE REDUCTION: This exam was performed according to the departmental dose-optimization program which includes automated exposure control, adjustment of the mA and/or kV  according to patient size and/or use of iterative reconstruction technique. COMPARISON:  None Available. FINDINGS: CT HEAD FINDINGS Brain: No evidence of acute infarction, hemorrhage, hydrocephalus, extra-axial collection or mass lesion/mass effect. Mild generalized cerebral atrophy. Patchy area of low-attenuation of the periventricular white matter presumed chronic microvascular ischemic changes. Vascular: No hyperdense vessel or unexpected calcification. Skull: Normal. Negative for fracture or focal lesion. Other: None. CT MAXILLOFACIAL FINDINGS Osseous: No fracture or mandibular dislocation. No destructive process. Orbits: Negative. No traumatic or inflammatory finding. Sinuses: Marked mucosal thickening of the bilateral maxillary sinuses with partial opacification. Mucosal thickening of the ethmoid air cells. Remaining paranasal sinuses and mastoid air cells appear clear. Soft tissues: No significant hematoma or fluid collection.  IMPRESSION: CT head: 1. No acute intracranial abnormality. 2. Mild generalized cerebral atrophy and chronic microvascular ischemic changes of the white matter. CT maxillofacial: 1. No acute facial bone fracture. 2. Bilateral maxillary and ethmoid sinus disease. Electronically Signed   By: Keane Police D.O.   On: 11/10/2022 17:33    Pending Labs Unresulted Labs (From admission, onward)     Start     Ordered   11/12/22 0500  CBC with Differential/Platelet  Daily,   R     Question:  Specimen collection method  Answer:  Lab=Lab collect   11/11/22 0609   11/12/22 0500  Brain natriuretic peptide  Daily,   R     Question:  Specimen collection method  Answer:  Lab=Lab collect   11/11/22 0609   11/12/22 1191  Basic metabolic panel  Daily,   R     Question:  Specimen collection method  Answer:  Lab=Lab collect   11/11/22 0609   11/12/22 0500  Magnesium  Daily,   R     Question:  Specimen collection method  Answer:  Lab=Lab collect   11/11/22 0609   11/11/22 1200  Magnesium  Once-Timed,   TIMED        11/11/22 4782   Signed and Held  Comprehensive metabolic panel  Tomorrow morning,   R       Question:  Release to patient  Answer:  Immediate   Signed and Held            Vitals/Pain Today's Vitals   11/11/22 1400 11/11/22 1415 11/11/22 1430 11/11/22 1656  BP: (!) 132/99 (!) 126/94 (!) 124/95 (!) 141/91  Pulse:   92 (!) 123  Resp:    (!) 25  Temp:      TempSrc:      SpO2:   94% 94%  Weight:      Height:      PainSc:    0-No pain    Isolation Precautions Airborne and Contact precautions  Medications Medications  fluticasone furoate-vilanterol (BREO ELLIPTA) 100-25 MCG/ACT 1 puff (1 puff Inhalation Given 11/11/22 1209)    And  umeclidinium bromide (INCRUSE ELLIPTA) 62.5 MCG/ACT 1 puff (1 puff Inhalation Given 11/11/22 1209)  acetaminophen (TYLENOL) tablet 650 mg (has no administration in time range)    Or  acetaminophen (TYLENOL) suppository 650 mg (has no administration in time  range)  HYDROcodone-acetaminophen (NORCO/VICODIN) 5-325 MG per tablet 1-2 tablet (has no administration in time range)  LORazepam (ATIVAN) injection 2 mg (has no administration in time range)  LORazepam (ATIVAN) tablet 1-4 mg (has no administration in time range)    Or  LORazepam (ATIVAN) injection 1-4 mg (has no administration in time range)  folic acid (FOLVITE) tablet 1 mg (1 mg Oral Given 11/11/22 1210)  multivitamin with minerals tablet 1 tablet (1 tablet Oral Given 11/11/22 1159)  nicotine (NICODERM CQ - dosed in mg/24 hours) patch 21 mg (21 mg Transdermal Patch Applied 11/11/22 1159)  albuterol (PROVENTIL) (2.5 MG/3ML) 0.083% nebulizer solution 2.5 mg (has no administration in time range)  thiamine (VITAMIN B1) 500 mg in normal saline (50 mL) IVPB (500 mg Intravenous Not Given 11/11/22 1541)    Followed by  thiamine (VITAMIN B1) 250 mg in sodium chloride 0.9 % 50 mL IVPB (has no administration in time range)    Followed by  thiamine (VITAMIN B1) injection 100 mg (has no administration in time range)  levETIRAcetam (KEPPRA) tablet 500 mg (500 mg Oral Given 11/11/22 1159)    Or  levETIRAcetam (KEPPRA) IVPB 500 mg/100 mL premix ( Intravenous See Alternative 11/11/22 1159)  carbamazepine (TEGRETOL) tablet 200 mg (200 mg Oral Given 11/11/22 1159)  0.9 %  sodium chloride infusion ( Intravenous Infusion Verify 11/11/22 1541)  diltiazem (CARDIZEM) tablet 90 mg (90 mg Oral Not Given 11/11/22 1538)  digoxin (LANOXIN) 0.25 MG/ML injection 0.125 mg ( Intravenous Canceled Entry 11/11/22 1530)  chlordiazePOXIDE (LIBRIUM) capsule 15 mg (15 mg Oral Given 11/11/22 1159)  pantoprazole (PROTONIX) EC tablet 40 mg (40 mg Oral Given 11/11/22 1159)  diclofenac Sodium (VOLTAREN) 1 % topical gel 2 g (2 g Topical Not Given 11/11/22 1456)  LORazepam (ATIVAN) tablet 1 mg (1 mg Oral Given 11/10/22 1752)  sodium chloride 0.9 % bolus 500 mL (0 mLs Intravenous Stopped 11/10/22 2206)  acetaminophen (TYLENOL) tablet 1,000 mg (1,000 mg Oral Given  11/10/22 2038)  LORazepam (ATIVAN) tablet 2 mg (2 mg Oral Given 11/10/22 2046)  carbamazepine (TEGRETOL) tablet 200 mg (200 mg Oral Given 11/11/22 0119)  diltiazem (CARDIZEM) injection 15 mg (15 mg Intravenous Given 11/10/22 2230)  sodium chloride 0.9 % bolus 1,000 mL (0 mLs Intravenous Stopped 11/11/22 0313)  thiamine (VITAMIN B1) injection 100 mg (100 mg Intravenous Given 02/11/41 5956)  folic acid (FOLVITE) tablet 1 mg (1 mg Oral Given 11/10/22 2230)  levETIRAcetam (KEPPRA) 2,000 mg in sodium chloride 0.9 % 250 mL IVPB (0 mg Intravenous Stopped 11/11/22 0519)  magnesium sulfate IVPB 2 g 50 mL (0 g Intravenous Stopped 11/11/22 0556)  magnesium sulfate IVPB 4 g 100 mL (0 g Intravenous Stopped 11/11/22 1353)  diltiazem (CARDIZEM) injection 10 mg (10 mg Intravenous Given 11/11/22 1159)    Mobility walks Low fall risk   Focused Assessments Cardiac Assessment Handoff:  Cardiac Rhythm: Atrial fibrillation Lab Results  Component Value Date   CKTOTAL 131 11/11/2022   TROPONINI <0.03 01/29/2019   No results found for: "DDIMER" Does the Patient currently have chest pain? No    R Recommendations: See Admitting Provider Note  Report given to:   Additional Notes: Pt A&Ox4.New onset Afib RVR. CIWA 0.

## 2022-11-11 NOTE — ED Notes (Signed)
Eeg at bedside

## 2022-11-11 NOTE — Plan of Care (Signed)
EEG reviewed, negative, plan per Dr. Lorrin Goodell earlier today. Neurology will be available as needed.   Lesleigh Noe MD-PhD [facility] [phone]

## 2022-11-11 NOTE — Assessment & Plan Note (Signed)
Check CK rehydrate possibly in the setting of alcohol abuse Would benefit from right upper quadrant ReSound to further evaluate

## 2022-11-11 NOTE — Progress Notes (Signed)
EEG complete - results pending 

## 2022-11-11 NOTE — Assessment & Plan Note (Signed)
He has chronic right knee pain but after the fall today have had severe pain and decreased range of motion  Obtain plain images to start with if persist may need Orthopedics evaluation

## 2022-11-11 NOTE — Assessment & Plan Note (Signed)
Monitor for any sign of withdrawal patient is on CIWA protocol

## 2022-11-11 NOTE — Progress Notes (Signed)
OT Cancellation Note  Patient Details Name: Jose [name]. MRN: [id number] DOB: Dec 05, 1970   Cancelled Treatment:     Per RN, patient currently in Afib, with RN requesting OT hold evaluation at this time. OT will follow back when patient is medically appropriate.   Corinne Ports E. Vonceil [place], OTR/L Acute Rehabilitation Services [phone]   Ascencion Dike 11/11/2022, 11:21 AM

## 2022-11-11 NOTE — H&P (Addendum)
Jose [name]. DJS:[id number] DOB: 26-Jul-1971 DOA: 11/10/2022   PCP: Jon Billings, NP   Outpatient Specialists:  CARDS: Alisa Graff, FNP     Patient arrived to ER on 11/10/22 at 1638 Referred by Attending Charlesetta Shanks, MD   Patient coming from:    home Lives   With roomate   Chief Complaint:   Chief Complaint  Patient presents with   Seizures    HPI: Jose [name]. is a 52 y.o. male with medical history significant of   chronic diastolic CHF, COPD, tobacco alcohol abuse,, hypertension    Presented with was found down unclear if had a seizure episode but was found to be hypotensive transported Possible Alcohol withdrawal  Had a birthday drank a lot not for the past few days  Was found down having seizure like activity  He may have hx of seizures  On tegretol unclear why   Initial BP 60/40 per EMS was given IV fluids Vitals now imporved  Noted to be in A.fib started on diltiazem drip Received a few doses of Ativan now does not provide hx    Per prior history is noncompliant with seizure medications Has severe history of COPD exacerbations in the past where he required BiPAP no prior history of A-fib Still smokes  Reports no prior hx of seizures from etoh withdrawal States he has been taking his tegretol Reports he felt lie a seizure was coming on  He was at work states and the next thing he knew he was with paramedics He has not been taking his lasix yesterday Deneis any CP States not interested in George drinking     Initial COVID TEST     in house  PCR testing  Pending  Lab Results  Component Value Date   [place] 12/21/2021   [place] NEGATIVE 11/30/2021     Regarding pertinent Chronic problems   Hyperlipidemia -  on statins Crestor Lipid Panel     Component Value Date/Time   CHOL 192 12/08/2021 1521   CHOL 233 (H) 07/07/2015 1553   TRIG 160 (H) 12/08/2021 1521   TRIG 361 (H) 07/07/2015 1553   HDL 62 12/08/2021 1521    CHOLHDL 3.1 12/08/2021 1521   CHOLHDL 5.6 01/30/2019 0440   VLDL 39 01/30/2019 0440   VLDL 72 (H) 07/07/2015 1553   LDLCALC 102 (H) 12/08/2021 1521   LABVLDL 28 12/08/2021 1521     HTN on Coreg Norvasc Imdur   chronic CHF diastolic  - last echo 8850 Grade 1 diastolic dysfunction         COPD -    followed by pulmonology    on baseline oxygen  2-3L,        Chronic anemia - baseline hg Hemoglobin & Hematocrit  Recent Labs    06/30/22 1545 08/24/22 1640 11/10/22 1749  HGB 11.6* 11.6* 12.6*     While in ER:   Found to be in A-fib with RVR started on diltiazem drip and heparin.  Heart rate now down to 110 Patient is status post Ativan for CIWA protocol Alcohol level undetectable Drug screen negative current Tegretol level low   Ordered  CT HEAD   NON acute  CXR - Cardiomegaly with mild bilateral perihilar atelectatic changes     Following Medications were ordered in ER: Medications  carbamazepine (TEGRETOL) tablet 200 mg (has no administration in time range)  sodium chloride 0.9 % bolus 1,000 mL (has no administration in time range)  heparin ADULT infusion 100 units/mL (25000 units/210m) (1,200 Units/hr Intravenous New Bag/Given 11/10/22 2239)  LORazepam (ATIVAN) tablet 1 mg (1 mg Oral Given 11/10/22 1752)  sodium chloride 0.9 % bolus 500 mL (0 mLs Intravenous Stopped 11/10/22 2206)  acetaminophen (TYLENOL) tablet 1,000 mg (1,000 mg Oral Given 11/10/22 2038)  LORazepam (ATIVAN) tablet 2 mg (2 mg Oral Given 11/10/22 2046)  diltiazem (CARDIZEM) injection 15 mg (15 mg Intravenous Given 11/10/22 2230)  thiamine (VITAMIN B1) injection 100 mg (100 mg Intravenous Given 15/1/0225852  folic acid (FOLVITE) tablet 1 mg (1 mg Oral Given 11/10/22 2230)    _______________________________________________________ ER Provider Called: Neurology Dr. KPhilomena DohenyThey Recommend admit to medicine    SEEN in ER   ED Triage Vitals  Enc Vitals Group     BP 11/10/22 1706 120/80     Pulse Rate 11/10/22  1706 (!) 120     Resp 11/10/22 1706 20     Temp 11/10/22 1903 98.2 F (36.8 C)     Temp Source 11/10/22 1903 Oral     SpO2 11/10/22 1706 95 %     Weight 11/10/22 2200 209 lb 7 oz (95 kg)     Height 11/10/22 2200 5' 10.5" (1.791 m)     Head Circumference --      Peak Flow --      Pain Score 11/10/22 1707 0     Pain Loc --      Pain Edu? --      Excl. in [place] --   TMAX(24)@     _________________________________________ Significant initial  Findings: Abnormal Labs Reviewed  CBC WITH DIFFERENTIAL/PLATELET - Abnormal; Notable for the following components:      Result Value   RBC 4.00 (*)    Hemoglobin 12.6 (*)    HCT 38.0 (*)    Platelets 53 (*)    nRBC 0.5 (*)    All other components within normal limits  COMPREHENSIVE METABOLIC PANEL - Abnormal; Notable for the following components:   Glucose, Bld 107 (*)    BUN 26 (*)    Calcium 8.2 (*)    Total Protein 5.8 (*)    Albumin 3.1 (*)    AST 93 (*)    ALT 48 (*)    All other components within normal limits  CARBAMAZEPINE LEVEL, TOTAL - Abnormal; Notable for the following components:   Carbamazepine Lvl <2.0 (*)    All other components within normal limits  CBG MONITORING, ED - Abnormal; Notable for the following components:   Glucose-Capillary 103 (*)    All other components within normal limits     _________________________ Troponin  ordered ECG: Ordered Personally reviewed and interpreted by me showing: HR : 113 Rhythm: Atrial fibrillation IVCD, consider atypical RBBB Abnormal lateral Q waves Probable anteroseptal infarct, old atrial fib new since previous tracing, wandering baseline but no apparent acute ischemic changes QTC 472   The recent clinical data is shown below. Vitals:   11/10/22 2043 11/10/22 2130 11/10/22 2200 11/10/22 2345  BP: 120/87 (!) 131/90  133/89  Pulse: (!) 133 86  84  Resp:  (!) 49  (!) 30  Temp:      TempSrc:      SpO2:  96%  95%  Weight:   95 kg   Height:   5' 10.5" (1.791 m)      WBC     Component Value Date/Time   WBC 4.1 11/10/2022 1749   LYMPHSABS 1.0 11/10/2022 1749   LYMPHSABS  1.5 08/24/2022 1640   MONOABS 0.5 11/10/2022 1749   EOSABS 0.0 11/10/2022 1749   EOSABS 0.1 08/24/2022 1640   BASOSABS 0.0 11/10/2022 1749   BASOSABS 0.1 08/24/2022 1640     Lactic Acid, Venous    Component Value Date/Time   LATICACIDVEN 1.3 12/21/2021 1739      UA   ordered   Urine analysis:    Component Value Date/Time   COLORURINE YELLOW (A) 01/12/2018 1118   APPEARANCEUR Clear 05/12/2020 0830   LABSPEC 1.015 01/12/2018 1118   PHURINE 6.0 01/12/2018 1118   GLUCOSEU Negative 05/12/2020 0830   HGBUR NEGATIVE 01/12/2018 1118   BILIRUBINUR Negative 05/12/2020 0830   KETONESUR NEGATIVE 01/12/2018 1118   PROTEINUR 3+ (A) 05/12/2020 0830   PROTEINUR NEGATIVE 01/12/2018 1118   NITRITE Negative 05/12/2020 0830   NITRITE NEGATIVE 01/12/2018 1118   LEUKOCYTESUR Negative 05/12/2020 0830    _______________________________________________ Hospitalist was called for admission for   Seizure  New onset atrial fibrillation    Thrombocytopenia    The following Work up has been ordered so far:  Orders Placed This Encounter  Procedures   Critical Care   CT Head Wo Contrast   CT Maxillofacial Wo Contrast   DG Lumbar Spine Complete   CBC with Differential   Comprehensive metabolic panel   Ethanol   Rapid urine drug screen (hospital performed)   Carbamazepine level, total   Heparin level (unfractionated)   Heparin level (unfractionated)   CBC   Diet NPO time specified   Document Height and Actual Weight   Cardiac monitoring (when placed in a treatment room)   Saline Lock IV, Maintain IV access   heparin per pharmacy consult   Consult for [place] Admission   CBG monitoring, ED   EKG 12-Lead   Seizure precautions     OTHER Significant initial  Findings:  labs showing:    Recent Labs  Lab 11/10/22 1749 11/11/22 0114  NA 137 138  K 4.2 3.6  CO2  22  --   GLUCOSE 107*  --   BUN 26*  --   CREATININE 0.73  --   CALCIUM 8.2*  --     Cr   stable,    Lab Results  Component Value Date   CREATININE 0.73 11/10/2022   CREATININE 0.80 10/20/2022   CREATININE 0.71 10/06/2022    Recent Labs  Lab 11/10/22 1749  AST 93*  ALT 48*  ALKPHOS 103  BILITOT 1.0  PROT 5.8*  ALBUMIN 3.1*   Lab Results  Component Value Date   CALCIUM 8.2 (L) 11/10/2022    Plt: Lab Results  Component Value Date   PLT 53 (L) 11/10/2022      Lab Results  Component Value Date   SARSCOV2NAA NEGATIVE 12/21/2021   [place] NEGATIVE 11/30/2021    Venous  Blood Gas result:  pH 7.38 O2 Saturation 90.5 %   pCO2, Ven 43 Low  mmHg Patient temperature 37.0  pO2, Ven 61 High  mmHg        Recent Labs  Lab 11/10/22 1749  WBC 4.1  NEUTROABS 2.5  HGB 12.6*  HCT 38.0*  MCV 95.0  PLT 53*    HG/HCT  stable,      Component Value Date/Time   HGB 12.6 (L) 11/10/2022 1749   HGB 11.6 (L) 08/24/2022 1640   HCT 38.0 (L) 11/10/2022 1749   HCT 34.8 (L) 08/24/2022 1640   MCV 95.0 11/10/2022 1749   MCV 95 08/24/2022 1640  No results for input(s): "LIPASE", "AMYLASE" in the last 168 hours. No results for input(s): "AMMONIA" in the last 168 hours.    Cardiac Panel (last 3 results) No results for input(s): "CKTOTAL", "CKMB", "TROPONINI", "RELINDX" in the last 72 hours.  .car BNP (last 3 results) Recent Labs    12/21/21 1639 06/27/22 0823 10/04/22 1518  BNP 209.2* 51.4 212.2*     DM  labs:  HbA1C: Recent Labs    02/24/22 1632 05/26/22 1608 08/24/22 1640  HGBA1C 5.7* 5.6 5.2       CBG (last 3)  Recent Labs    11/10/22 2049  GLUCAP 103*          Cultures:    Component Value Date/Time   SDES BLOOD RIGHT ASSIST CONTROL 12/21/2021 1739   SDES BLOOD RIGHT FOREARM 12/21/2021 1739   SPECREQUEST  12/21/2021 1739    BOTTLES DRAWN AEROBIC AND ANAEROBIC Blood Culture results may not be optimal due to an excessive volume of blood  received in culture bottles   SPECREQUEST  12/21/2021 1739    BOTTLES DRAWN AEROBIC AND ANAEROBIC Blood Culture results may not be optimal due to an excessive volume of blood received in culture bottles   CULT  12/21/2021 1739    NO GROWTH 5 DAYS Performed at [facility], [address] [place], [place] [address]    CULT  12/21/2021 1739    NO GROWTH 5 DAYS Performed at [facility], [address] [place], [place] [address]    REPTSTATUS 12/26/2021 FINAL 12/21/2021 1739   REPTSTATUS 12/26/2021 FINAL 12/21/2021 1739     Radiological Exams on Admission: DG Lumbar Spine Complete  Result Date: 11/10/2022 CLINICAL DATA:  Fall. EXAM: LUMBAR SPINE - COMPLETE 4+ VIEW COMPARISON:  None Available. FINDINGS: No fracture or traumatic malalignment. Multilevel degenerative disc disease, most prominent in the lower lumbar spine. Lower lumbar facet degenerative changes. No other bony or soft tissue abnormalities identified. IMPRESSION: No fracture or traumatic malalignment. Degenerative changes as above. Electronically Signed   By: Dorise Bullion III M.D.   On: 11/10/2022 17:51   CT Head Wo Contrast  Result Date: 11/10/2022 CLINICAL DATA:  Seizures, new onset.  Head and facial trauma. EXAM: CT HEAD WITHOUT CONTRAST CT MAXILLOFACIAL WITHOUT CONTRAST TECHNIQUE: Multidetector CT imaging of the head and maxillofacial structures were performed using the standard protocol without intravenous contrast. Multiplanar CT image reconstructions of the maxillofacial structures were also generated. RADIATION DOSE REDUCTION: This exam was performed according to the departmental dose-optimization program which includes automated exposure control, adjustment of the mA and/or kV according to patient size and/or use of iterative reconstruction technique. COMPARISON:  None Available. FINDINGS: CT HEAD FINDINGS Brain: No evidence of acute infarction, hemorrhage, hydrocephalus, extra-axial collection or mass  lesion/mass effect. Mild generalized cerebral atrophy. Patchy area of low-attenuation of the periventricular white matter presumed chronic microvascular ischemic changes. Vascular: No hyperdense vessel or unexpected calcification. Skull: Normal. Negative for fracture or focal lesion. Other: None. CT MAXILLOFACIAL FINDINGS Osseous: No fracture or mandibular dislocation. No destructive process. Orbits: Negative. No traumatic or inflammatory finding. Sinuses: Marked mucosal thickening of the bilateral maxillary sinuses with partial opacification. Mucosal thickening of the ethmoid air cells. Remaining paranasal sinuses and mastoid air cells appear clear. Soft tissues: No significant hematoma or fluid collection. IMPRESSION: CT head: 1. No acute intracranial abnormality. 2. Mild generalized cerebral atrophy and chronic microvascular ischemic changes of the white matter. CT maxillofacial: 1. No acute facial bone fracture. 2. Bilateral maxillary and  ethmoid sinus disease. Electronically Signed   By: Keane Police D.O.   On: 11/10/2022 17:33   CT Maxillofacial Wo Contrast  Result Date: 11/10/2022 CLINICAL DATA:  Seizures, new onset.  Head and facial trauma. EXAM: CT HEAD WITHOUT CONTRAST CT MAXILLOFACIAL WITHOUT CONTRAST TECHNIQUE: Multidetector CT imaging of the head and maxillofacial structures were performed using the standard protocol without intravenous contrast. Multiplanar CT image reconstructions of the maxillofacial structures were also generated. RADIATION DOSE REDUCTION: This exam was performed according to the departmental dose-optimization program which includes automated exposure control, adjustment of the mA and/or kV according to patient size and/or use of iterative reconstruction technique. COMPARISON:  None Available. FINDINGS: CT HEAD FINDINGS Brain: No evidence of acute infarction, hemorrhage, hydrocephalus, extra-axial collection or mass lesion/mass effect. Mild generalized cerebral atrophy. Patchy  area of low-attenuation of the periventricular white matter presumed chronic microvascular ischemic changes. Vascular: No hyperdense vessel or unexpected calcification. Skull: Normal. Negative for fracture or focal lesion. Other: None. CT MAXILLOFACIAL FINDINGS Osseous: No fracture or mandibular dislocation. No destructive process. Orbits: Negative. No traumatic or inflammatory finding. Sinuses: Marked mucosal thickening of the bilateral maxillary sinuses with partial opacification. Mucosal thickening of the ethmoid air cells. Remaining paranasal sinuses and mastoid air cells appear clear. Soft tissues: No significant hematoma or fluid collection. IMPRESSION: CT head: 1. No acute intracranial abnormality. 2. Mild generalized cerebral atrophy and chronic microvascular ischemic changes of the white matter. CT maxillofacial: 1. No acute facial bone fracture. 2. Bilateral maxillary and ethmoid sinus disease. Electronically Signed   By: Keane Police D.O.   On: 11/10/2022 17:33   _______________________________________________________________________________________________________ Latest  Blood pressure 133/89, pulse 84, temperature 98.2 F (36.8 C), temperature source Oral, resp. rate (!) 30, height 5' 10.5" (1.791 m), weight 95 kg, SpO2 95 %.   Vitals  labs and radiology finding personally reviewed  Review of Systems:    Pertinent positives include: syncope vs seizure  Constitutional:  No weight loss, night sweats, Fevers, chills, fatigue, weight loss  HEENT:  No headaches, Difficulty swallowing,Tooth/dental problems,Sore throat,  No sneezing, itching, ear ache, nasal congestion, post nasal drip,  Cardio-vascular:  No chest pain, Orthopnea, PND, anasarca, dizziness, palpitations.no Bilateral lower extremity swelling  GI:  No heartburn, indigestion, abdominal pain, nausea, vomiting, diarrhea, change in bowel habits, loss of appetite, melena, blood in stool, hematemesis Resp:  no shortness of breath  at rest. No dyspnea on exertion, No excess mucus, no productive cough, No non-productive cough, No coughing up of blood.No change in color of mucus.No wheezing. Skin:  no rash or lesions. No jaundice GU:  no dysuria, change in color of urine, no urgency or frequency. No straining to urinate.  No flank pain.  Musculoskeletal:  No joint pain or no joint swelling. No decreased range of motion. No back pain.  Psych:  No change in mood or affect. No depression or anxiety. No memory loss.  Neuro: no localizing neurological complaints, no tingling, no weakness, no double vision, no gait abnormality, no slurred speech, no confusion  All systems reviewed and apart from [place] all are negative _______________________________________________________________________________________________ Past Medical History:   Past Medical History:  Diagnosis Date   Alcohol abuse    Cervicalgia    CHF (congestive heart failure) (HCC)    Hyperlipidemia    Hypertension    Seizures ([place])      Past Surgical History:  Procedure Laterality Date   COLONOSCOPY WITH PROPOFOL N/A 01/16/2018   Procedure: COLONOSCOPY WITH PROPOFOL;  Surgeon: Sherri Sear  Reece Levy, MD;  Location: [facility];  Service: Gastroenterology;  Laterality: N/A;   ESOPHAGOGASTRODUODENOSCOPY (EGD) WITH PROPOFOL N/A 01/16/2018   Procedure: ESOPHAGOGASTRODUODENOSCOPY (EGD) WITH PROPOFOL;  Surgeon: Lin Landsman, MD;  Location[facility] ENDOSCOPY;  Service: Gastroenterology;  Laterality: N/A;    Social History:  Ambulatory   independently      reports that he has been smoking cigars and cigarettes. He started smoking about 37 years ago. He has a 33.00 pack-year smoking history. He has quit using smokeless tobacco. He reports current alcohol use of about 28.0 standard drinks of alcohol per week. He reports that he does not use drugs.    Family History:   Family History  Problem Relation Age of Onset   Diabetes Mother    Colon cancer Mother     Hypertension Father    Multiple sclerosis Father    ______________________________________________________________________________________________ Allergies: No Known Allergies   Prior to Admission medications   Medication Sig Start Date End Date Taking? Authorizing Provider  albuterol (VENTOLIN HFA) 108 (90 Base) MCG/ACT inhaler Inhale 2 puffs into the lungs every 6 (six) hours as needed for wheezing or shortness of breath. 06/27/22   Lucillie Garfinkel, MD  amLODipine (NORVASC) 10 MG tablet Take 1 tablet (10 mg total) by mouth daily. 04/18/22   Jon Billings, NP  carbamazepine (TEGRETOL) 200 MG tablet Take 1 tablet (200 mg total) by mouth 2 (two) times daily. 08/24/22   Jon Billings, NP  carvedilol (COREG) 25 MG tablet Take 1 tablet (25 mg total) by mouth 2 (two) times daily with a meal. 04/18/22   Jon Billings, NP  empagliflozin (JARDIANCE) 10 MG TABS tablet Take 1 tablet (10 mg total) by mouth daily before breakfast. 05/26/22   Jon Billings, NP  Fluticasone-Umeclidin-Vilant (TRELEGY ELLIPTA) 100-62.5-25 MCG/ACT AEPB Inhale 1 puff into the lungs daily. 08/24/22   Jon Billings, NP  furosemide (LASIX) 40 MG tablet Take 1 tablet (40 mg total) by mouth daily. 10/04/22   Alisa Graff, FNP  hydrALAZINE (APRESOLINE) 50 MG tablet Take 1 tablet (50 mg total) by mouth 3 (three) times daily. 04/18/22   Jon Billings, NP  Iron, Ferrous Sulfate, 325 (65 Fe) MG TABS Take 325 mg by mouth daily. 08/24/22   Jon Billings, NP  isosorbide mononitrate (IMDUR) 30 MG 24 hr tablet Take 1 tablet (30 mg total) by mouth daily. Patient not taking: Reported on 10/20/2022 08/24/22   Jon Billings, NP  potassium chloride (MICRO-K) 10 MEQ CR capsule Take 1 capsule (10 mEq total) by mouth daily. 10/04/22   Alisa Graff, FNP  rosuvastatin (CRESTOR) 20 MG tablet Take 1 tablet (20 mg total) by mouth daily. 08/24/22   Jon Billings, NP  sacubitril-valsartan (ENTRESTO) 49-51 MG Take 1  tablet by mouth 2 (two) times daily. 10/20/22   Alisa Graff, FNP    ___________________________________________________________________________________________________ Physical Exam:    11/10/2022   11:45 PM 11/10/2022   10:00 PM 11/10/2022    9:30 PM  Vitals with BMI  Height  5' 10.5"   Weight  209 lbs 7 oz   BMI  06.30   Systolic 160  109  Diastolic 89  90  Pulse 84  86    1. General:  in No  Acute distress   Chronically ill appearing 2. Psychological: Alert and   Oriented 3. Head/ENT:  Dry Mucous Membranes  Head Non traumatic, neck supple                        Poor Dentition 4. SKIN:  decreased Skin turgor,  Skin clean Dry and intact no rash 5. Heart: Regular rate and rhythm no  Murmur, no Rub or gallop 6. Lungs: occasional wheezing  no crackles   7. Abdomen: Soft,  non-tender, Non distended   obese  bowel sounds present 8. Lower extremities: no clubbing, cyanosis, no  edema 9. Neurologically   strength 5 out of 5 in all 4 extremities cranial nerves II through XII intact 10. MSK: Normal range of motion  Right knee reduced range of motion and pain  Chart has been reviewed  ______________________________________________________________________________________________  Assessment/Plan 52 y.o. male with medical history significant of   chronic diastolic CHF, COPD, tobacco alcohol abuse,, hypertension  Admitted for   Seizure    New onset atrial fibrillation     Thrombocytopenia       Present on Admission:  Acute metabolic encephalopathy  Alcohol abuse  Hypertension  Tobacco dependence  Thrombocytopenia (HCC)  Elevated LFTs  COPD (chronic obstructive pulmonary disease) (HCC)  Chronic diastolic congestive heart failure (HCC)  New onset a-fib (HCC)  Right knee pain     Seizures ([place]) History of seizure disorder but has not been compliant with Tegretol according to prior notes.  Unclear if this was seizure Reports now is compliant but  Tegretol level is low Possibly in the setting of recent alcohol use Denies decreasing EtOH  States gets a seizure every 1 year   Per neurology will load with keppra 2000 mg iv Defer to NEurology regarding long term tegretol management Seizure precautions  Eeg in AM   Acute metabolic encephalopathy Patient received Ativan for possible CIWA protocol Now fairly somnolent.  Given history of COPD will obtain VBG Ammonia level May benefit from EEG to make sure that there is no ongoing seizure state  Alcohol abuse Monitor for any sign of withdrawal patient is on CIWA protocol  Hypertension Given soft blood pressures will allow permissive hypertension  Tobacco dependence When patient is awake will discuss risks of smoking  Thrombocytopenia ([place]) In the setting of alcohol abuse.  .  Will continue to follow continue to follow may need hematology consult  Elevated LFTs Check CK rehydrate possibly in the setting of alcohol abuse Would benefit from right upper quadrant ReSound to further evaluate  COPD (chronic obstructive pulmonary disease) (HCC) Albuterol as needed check VBG to make sure is no CO2 retention in the setting of oversedation.  Patient has required BiPAP in the past  Chronic diastolic congestive heart failure ([place]) - currently appears to be slightly on the dry side, hold home diuretics for tonight and restart when appears euvolemic, carefuly follow fluid status and Cr  New onset a-fib [facility]) Patient was started on heparin drip. And diltiazem Order TSH Echogram Would benefit from cardiology consult.  This is a difficult decision regarding anticoagulation patient with history of seizure disorders and alcohol abuse with noncompliance  Right knee pain He has chronic right knee pain but after the fall today have had severe pain and decreased range of motion  Obtain plain images to start with if persist may need Orthopedics evaluation   Hypomagnesemia Will replace and  recheck in AM   Other plan as per orders.  DVT prophylaxis:   heparin      Code Status:    Code Status: Prior FULL CODE  as per patient   I had personally discussed CODE STATUS with patient     Family Communication:   Family not at  Bedside    Disposition Plan:           To home once workup is complete and patient is stable   Following barriers for discharge:                            Electrolytes corrected                                                      Will need consultants to evaluate patient prior to discharge  Consults called: neurology   is aware, emailed cardiology   Admission status:  ED Disposition     ED Disposition  [place]: [place] [[id number]]  Level of Care: Progressive [102]  Admit to Progressive based on following criteria: CARDIOVASCULAR & THORACIC of moderate stability with acute coronary syndrome symptoms/low risk myocardial infarction/hypertensive urgency/arrhythmias/heart failure potentially compromising stability and stable post cardiovascular intervention patients.  May place patient in observation at [facility] or [place] if equivalent level of care is available:: No  Covid Evaluation: Asymptomatic - no recent exposure (last 10 days) testing not required  Diagnosis: Seizure disorder [facility]) [[id number]]  Admitting Physician: Toy Baker [[id number]]  Attending Physician: Toy Baker [[id number]]          Obs    Level of care        progressive tele indefinitely please discontinue once patient no longer qualifies COVID-19 Labs     Galia Rahm 11/11/2022, 2:15 AM    [facility]     after 2 AM please page floor coverage PA If 7AM-7PM, please contact the day team taking care of the patient using [email or web address]   Patient was evaluated in the context of the global COVID-19 pandemic, which necessitated consideration that the patient might be at risk for infection with the  SARS-CoV-2 virus that causes COVID-19. Institutional protocols and algorithms that pertain to the evaluation of patients at risk for COVID-19 are in a state of rapid change based on information released by regulatory bodies including the CDC and federal and state organizations. These policies and algorithms were followed during the patient's care.

## 2022-11-11 NOTE — Assessment & Plan Note (Signed)
Given soft blood pressures will allow permissive hypertension

## 2022-11-11 NOTE — ED Notes (Signed)
Pt removed monitoring equipment again. Another RN is placing pt back on cardiac monitor

## 2022-11-11 NOTE — Assessment & Plan Note (Signed)
In the setting of alcohol abuse.  .  Will continue to follow continue to follow may need hematology consult

## 2022-11-11 NOTE — ED Notes (Signed)
Educated pt about importance of keeping his cardiac monitoring leads on. Pt verbalized understanding at this time. Gave pt his call light and switched his oxygen from the tank on the bed to the wall

## 2022-11-11 NOTE — Progress Notes (Signed)
Echocardiogram 2D Echocardiogram has been performed.  Jose [name] 11/11/2022, 10:53 AM

## 2022-11-11 NOTE — Assessment & Plan Note (Signed)
Patient was started on heparin drip. And diltiazem Order TSH Echogram Would benefit from cardiology consult.  This is a difficult decision regarding anticoagulation patient with history of seizure disorders and alcohol abuse with noncompliance

## 2022-11-11 NOTE — Progress Notes (Signed)
PT Cancellation Note  Patient Details Name: Jose [name]. MRN: [id number] DOB: Dec 30, 1970   Cancelled Treatment:    Reason Eval/Treat Not Completed: Medical issues which prohibited therapy Per RN, patient currently in Afib, with RN requesting PT hold evaluation at this time. PT will follow back when patient is medically appropriate.   Abran Richard, PT Acute Rehab [facility] [phone]  Karlton Lemon 11/11/2022, 12:41 PM

## 2022-11-11 NOTE — H&P (Incomplete)
Jose [name]. LFY:[id number] DOB: 1970-12-24 DOA: 11/10/2022     PCP: Jon Billings, NP   Outpatient Specialists: * NONE CARDS: Alisa Graff, FNP  NEphrology: *  Dr. NEurology *   Dr. Pulmonary *  Dr.  Oncology * Dr. Fabienne Bruns* Dr.  Sadie Haber, [facility]) No care team member to display Urology Dr. *  Patient arrived to ER on 11/10/22 at 1638 Referred by Attending Charlesetta Shanks, MD   Patient coming from:    home Lives alone,   *** With family    Chief Complaint:   Chief Complaint  Patient presents with  . Seizures    HPI: Jose [name]. is a 52 y.o. male with medical history significant of   chronic diastolic CHF, COPD, tobacco alcohol abuse,, hypertension    Presented with   * Possible Alcohol withdrawal  Had a birthday drank a lot not for the past few days  Was found down having seizure like activity  He may have hx of seizures  On tegretol unclear why   Initial BP 60/40 per EMS was given IV fluids Vitals now imporved  Noted to be in A.fib started on diltiazem drip Received a few doses of Ativan now does not provide hx         Initial COVID TEST  NEGATIVE**** POSITIVE,  ***in house  PCR testing  Pending  Lab Results  Component Value Date   [place] NEGATIVE 12/21/2021   [place] NEGATIVE 11/30/2021     Regarding pertinent Chronic problems   Hyperlipidemia -  on statins Crestor Lipid Panel     Component Value Date/Time   CHOL 192 12/08/2021 1521   CHOL 233 (H) 07/07/2015 1553   TRIG 160 (H) 12/08/2021 1521   TRIG 361 (H) 07/07/2015 1553   HDL 62 12/08/2021 1521   CHOLHDL 3.1 12/08/2021 1521   CHOLHDL 5.6 01/30/2019 0440   VLDL 39 01/30/2019 0440   VLDL 72 (H) 07/07/2015 1553   LDLCALC 102 (H) 12/08/2021 1521   LABVLDL 28 12/08/2021 1521     HTN on Coreg Norvasc Imdur   chronic CHF diastolic  - last echo 8527 Grade 1 diastolic dysfunction     ***DM 2 -  Lab Results  Component Value Date   HGBA1C 5.2 08/24/2022   ****on insulin, PO  meds only, diet controlled  ***Hypothyroidism:  Lab Results  Component Value Date   TSH 1.400 05/12/2020   on synthroid  *** Morbid obesity-   BMI Readings from Last 1 Encounters:  11/10/22 29.63 kg/m     *** Asthma -well *** controlled on home inhalers/ nebs f                        ***last no prior***admission  ***                       No ***history of intubation  *** COPD - not **followed by pulmonology *** not  on baseline oxygen  *L,    *** OSA -on nocturnal oxygen, *CPAP, *noncompliant with CPAP  *** Hx of CVA - *with/out residual deficits on Aspirin 81 mg, 325, Plavix  ***A. Fib -  - CHA2DS2 vas score **** CHA2DS2/VAS Stroke Risk Points  Current as of 26 minutes ago     2 >= 2 Points: High Risk  1 - 1.99 Points: Medium Risk  0 Points: Low Risk    No Change  Details    This score determines the patient's risk of having a stroke if the  patient has atrial fibrillation.       Points Metrics  1 Has Congestive Heart Failure:  Yes    Current as of 26 minutes ago  0 Has Vascular Disease:  No    Current as of 26 minutes ago  1 Has Hypertension:  Yes    Current as of 26 minutes ago  0 Age:  54    Current as of 26 minutes ago  0 Has Diabetes:  No    Current as of 26 minutes ago  0 Had Stroke:  No  Had TIA:  No  Had Thromboembolism:  No    Current as of 26 minutes ago  0 Male:  No    Current as of 26 minutes ago           current  on anticoagulation with ****Coumadin  ***Xarelto,* Eliquis,  *** Not on anticoagulation secondary to Risk of Falls, *** recurrent bleeding         -  Rate control:  Currently controlled with ***Toprolol,  *Metoprolol,* Diltiazem, *Coreg          - Rhythm control: *** amiodarone, *flecainide  ***Hx of DVT/PE on - anticoagulation with ****Coumadin  ***Xarelto,* Eliquis,   ***CKD stage III*- baseline Cr **** Estimated Creatinine Clearance: 124.6 mL/min (by C-G formula based on SCr of 0.73 mg/dL).  Lab Results  Component Value  Date   CREATININE 0.73 11/10/2022   CREATININE 0.80 10/20/2022   CREATININE 0.71 10/06/2022     **** Liver disease Computed MELD 3.0 unavailable. Necessary lab results were not found in the last year. Computed MELD-Na unavailable. Necessary lab results were not found in the last year.    ***BPH - on Flomax, Proscar    *** Dementia - on Aricept** Nemenda  *** Chronic anemia - baseline hg Hemoglobin & Hematocrit  Recent Labs    06/30/22 1545 08/24/22 1640 11/10/22 1749  HGB 11.6* 11.6* 12.6*     While in ER:       Ordered  CT HEAD *** NON acute  CXR - ***NON acute  CTabd/pelvis - ***nonacute  CTA chest - ***nonacute, no PE, * no evidence of infiltrate  Following Medications were ordered in ER: Medications  carbamazepine (TEGRETOL) tablet 200 mg (has no administration in time range)  sodium chloride 0.9 % bolus 1,000 mL (has no administration in time range)  heparin ADULT infusion 100 units/mL (25000 units/269m) (1,200 Units/hr Intravenous New Bag/Given 11/10/22 2239)  LORazepam (ATIVAN) tablet 1 mg (1 mg Oral Given 11/10/22 1752)  sodium chloride 0.9 % bolus 500 mL (0 mLs Intravenous Stopped 11/10/22 2206)  acetaminophen (TYLENOL) tablet 1,000 mg (1,000 mg Oral Given 11/10/22 2038)  LORazepam (ATIVAN) tablet 2 mg (2 mg Oral Given 11/10/22 2046)  diltiazem (CARDIZEM) injection 15 mg (15 mg Intravenous Given 11/10/22 2230)  thiamine (VITAMIN B1) injection 100 mg (100 mg Intravenous Given 18/9/3821017  folic acid (FOLVITE) tablet 1 mg (1 mg Oral Given 11/10/22 2230)    _______________________________________________________ ER Provider Called:     Dr.Marland Kitchen They Recommend admit to medicine *** Will see in AM  ***SEEN in ER   ED Triage Vitals  Enc Vitals Group     BP 11/10/22 1706 120/80     Pulse Rate 11/10/22 1706 (!) 120     Resp 11/10/22 1706 20     Temp 11/10/22 1903 98.2 F (36.8 C)  Temp Source 11/10/22 1903 Oral     SpO2 11/10/22 1706 95 %     Weight 11/10/22  2200 209 lb 7 oz (95 kg)     Height 11/10/22 2200 5' 10.5" (1.791 m)     Head Circumference --      Peak Flow --      Pain Score 11/10/22 1707 0     Pain Loc --      Pain Edu? --      Excl. in [place]? --   TMAX(24)@     _________________________________________ Significant initial  Findings: Abnormal Labs Reviewed  CBC WITH DIFFERENTIAL/PLATELET - Abnormal; Notable for the following components:      Result Value   RBC 4.00 (*)    Hemoglobin 12.6 (*)    HCT 38.0 (*)    Platelets 53 (*)    nRBC 0.5 (*)    All other components within normal limits  COMPREHENSIVE METABOLIC PANEL - Abnormal; Notable for the following components:   Glucose, Bld 107 (*)    BUN 26 (*)    Calcium 8.2 (*)    Total Protein 5.8 (*)    Albumin 3.1 (*)    AST 93 (*)    ALT 48 (*)    All other components within normal limits  CARBAMAZEPINE LEVEL, TOTAL - Abnormal; Notable for the following components:   Carbamazepine Lvl <2.0 (*)    All other components within normal limits  CBG MONITORING, ED - Abnormal; Notable for the following components:   Glucose-Capillary 103 (*)    All other components within normal limits     _________________________ Troponin ***ordered ECG: Ordered Personally reviewed and interpreted by me showing: HR : *** Rhythm: *NSR, Sinus tachycardia * A.fib. W RVR, RBBB, LBBB, Paced Ischemic changes*nonspecific changes, no evidence of ischemic changes QTC*   ____________________ This patient meets SIRS Criteria and may be septic. SIRS = Systemic Inflammatory Response Syndrome  Order a lactic acid level if needed AND/OR Initiate the sepsis protocol with the attached order set OR Click "Treating Associated Infection or Illness" if the patient is being treated for an infection that is a known cause of these abnormalities     The recent clinical data is shown below. Vitals:   11/10/22 2043 11/10/22 2130 11/10/22 2200 11/10/22 2345  BP: 120/87 (!) 131/90  133/89  Pulse: (!) 133 86   84  Resp:  (!) 49  (!) 30  Temp:      TempSrc:      SpO2:  96%  95%  Weight:   95 kg   Height:   5' 10.5" (1.791 m)         WBC     Component Value Date/Time   WBC 4.1 11/10/2022 1749   LYMPHSABS 1.0 11/10/2022 1749   LYMPHSABS 1.5 08/24/2022 1640   MONOABS 0.5 11/10/2022 1749   EOSABS 0.0 11/10/2022 1749   EOSABS 0.1 08/24/2022 1640   BASOSABS 0.0 11/10/2022 1749   BASOSABS 0.1 08/24/2022 1640        Lactic Acid, Venous    Component Value Date/Time   LATICACIDVEN 1.3 12/21/2021 1739     Procalcitonin *** Ordered Lactic Acid, Venous    Component Value Date/Time   LATICACIDVEN 1.3 12/21/2021 1739     Procalcitonin *** Ordered   UA *** no evidence of UTI  ***Pending ***not ordered   Urine analysis:    Component Value Date/Time   COLORURINE YELLOW (A) 01/12/2018 1118   APPEARANCEUR Clear 05/12/2020 0830  LABSPEC 1.015 01/12/2018 1118   PHURINE 6.0 01/12/2018 1118   GLUCOSEU Negative 05/12/2020 0830   HGBUR NEGATIVE 01/12/2018 1118   BILIRUBINUR Negative 05/12/2020 0830   KETONESUR NEGATIVE 01/12/2018 1118   PROTEINUR 3+ (A) 05/12/2020 0830   PROTEINUR NEGATIVE 01/12/2018 1118   NITRITE Negative 05/12/2020 0830   NITRITE NEGATIVE 01/12/2018 1118   LEUKOCYTESUR Negative 05/12/2020 0830    Results for orders placed or performed during the hospital encounter of 12/21/21  Resp Panel by RT-PCR (Flu A&B, Covid) Nasopharyngeal Swab     Status: None   Collection Time: 12/21/21  4:41 PM   Specimen: Nasopharyngeal Swab; Nasopharyngeal(NP) swabs in vial transport medium  Result Value Ref Range Status   SARS Coronavirus 2 by RT PCR NEGATIVE NEGATIVE Final    Comment: (NOTE) SARS-CoV-2 target nucleic acids are NOT DETECTED.  The SARS-CoV-2 RNA is generally detectable in upper respiratory specimens during the acute phase of infection. The lowest concentration of SARS-CoV-2 viral copies this assay can detect is 138 copies/mL. A negative result does not  preclude SARS-Cov-2 infection and should not be used as the sole basis for treatment or other patient management decisions. A negative result may occur with  improper specimen collection/handling, submission of specimen other than nasopharyngeal swab, presence of viral mutation(s) within the areas targeted by this assay, and inadequate number of viral copies(<138 copies/mL). A negative result must be combined with clinical observations, patient history, and epidemiological information. The expected result is Negative.  Fact Sheet for Patients:  EntrepreneurPulse.com.au  Fact Sheet for Healthcare Providers:  IncredibleEmployment.be  This test is no t yet approved or cleared by the [place] FDA and  has been authorized for detection and/or diagnosis of SARS-CoV-2 by FDA under an Emergency Use Authorization (EUA). This EUA will remain  in effect (meaning this test can be used) for the duration of the COVID-19 declaration under Section 564(b)(1) of the Act, 21 U.S.C.section 360bbb-3(b)(1), unless the authorization is terminated  or revoked sooner.       Influenza A by PCR NEGATIVE NEGATIVE Final   Influenza B by PCR NEGATIVE NEGATIVE Final    Comment: (NOTE) The Xpert Xpress SARS-CoV-2/FLU/RSV plus assay is intended as an aid in the diagnosis of influenza from Nasopharyngeal swab specimens and should not be used as a sole basis for treatment. Nasal washings and aspirates are unacceptable for Xpert Xpress SARS-CoV-2/FLU/RSV testing.  Fact Sheet for Patients: EntrepreneurPulse.com.au  Fact Sheet for Healthcare Providers: IncredibleEmployment.be  This test is not yet approved or cleared by the [place] FDA and has been authorized for detection and/or diagnosis of SARS-CoV-2 by FDA under an Emergency Use Authorization (EUA). This EUA will remain in effect (meaning this test can be used) for the  duration of the COVID-19 declaration under Section 564(b)(1) of the Act, 21 U.S.C. section 360bbb-3(b)(1), unless the authorization is terminated or revoked.  Performed at [facility], [address] [place], [place] [address]   Culture, blood (Routine X 2) w Reflex to ID Panel     Status: None   Collection Time: 12/21/21  5:39 PM   Specimen: BLOOD  Result Value Ref Range Status   Specimen Description BLOOD RIGHT ASSIST CONTROL  Final   Special Requests   Final    BOTTLES DRAWN AEROBIC AND ANAEROBIC Blood Culture results may not be optimal due to an excessive volume of blood received in culture bottles   Culture   Final    NO GROWTH 5 DAYS Performed at [facility]  [facility], [address] [place], [place] [address]    Report Status 12/26/2021 FINAL  Final  Culture, blood (Routine X 2) w Reflex to ID Panel     Status: None   Collection Time: 12/21/21  5:39 PM   Specimen: BLOOD  Result Value Ref Range Status   Specimen Description BLOOD RIGHT FOREARM  Final   Special Requests   Final    BOTTLES DRAWN AEROBIC AND ANAEROBIC Blood Culture results may not be optimal due to an excessive volume of blood received in culture bottles   Culture   Final    NO GROWTH 5 DAYS Performed at [facility], [address] [place], [place] [address]    Report Status 12/26/2021 FINAL  Final  Respiratory (~20 pathogens) panel by PCR     Status: None   Collection Time: 12/22/21 12:15 PM   Specimen: Nasopharyngeal Swab; Respiratory  Result Value Ref Range Status   Adenovirus NOT DETECTED NOT DETECTED Final   Coronavirus 229E NOT DETECTED NOT DETECTED Final    Comment: (NOTE) The Coronavirus on the Respiratory Panel, DOES NOT test for the novel  Coronavirus (2019 nCoV)    Coronavirus HKU1 NOT DETECTED NOT DETECTED Final   Coronavirus NL63 NOT DETECTED NOT DETECTED Final   Coronavirus OC43 NOT DETECTED NOT DETECTED Final   Metapneumovirus NOT DETECTED NOT DETECTED Final    Rhinovirus / Enterovirus NOT DETECTED NOT DETECTED Final   Influenza A NOT DETECTED NOT DETECTED Final   Influenza B NOT DETECTED NOT DETECTED Final   Parainfluenza Virus 1 NOT DETECTED NOT DETECTED Final   Parainfluenza Virus 2 NOT DETECTED NOT DETECTED Final   Parainfluenza Virus 3 NOT DETECTED NOT DETECTED Final   Parainfluenza Virus 4 NOT DETECTED NOT DETECTED Final   Respiratory Syncytial Virus NOT DETECTED NOT DETECTED Final   Bordetella pertussis NOT DETECTED NOT DETECTED Final   Bordetella Parapertussis NOT DETECTED NOT DETECTED Final   Chlamydophila pneumoniae NOT DETECTED NOT DETECTED Final   Mycoplasma pneumoniae NOT DETECTED NOT DETECTED Final    Comment: Performed at [facility], [address], [place], [place] [address]     _______________________________________________ Hospitalist was called for admission for *** Seizure [facility]) ***  New onset atrial fibrillation ([place]) ***  Thrombocytopenia ([place]) ***    The following Work up has been ordered so far:  Orders Placed This Encounter  Procedures  . Critical Care  . CT Head Wo Contrast  . CT Maxillofacial Wo Contrast  . DG Lumbar Spine Complete  . CBC with Differential  . Comprehensive metabolic panel  . Ethanol  . Rapid urine drug screen (hospital performed)  . Carbamazepine level, total  . Heparin level (unfractionated)  . Heparin level (unfractionated)  . CBC  . Diet NPO time specified  . Document Height and Actual Weight  . Cardiac monitoring (when placed in a treatment room)  . Saline Lock IV, Maintain IV access  . heparin per pharmacy consult  . Consult for [facility] Admission  . CBG monitoring, ED  . EKG 12-Lead  . Seizure precautions     OTHER Significant initial  Findings:  labs showing:    Recent Labs  Lab 11/10/22 1749  NA 137  K 4.2  CO2 22  GLUCOSE 107*  BUN 26*  CREATININE 0.73  CALCIUM 8.2*    Cr  * stable,  Up from baseline see below Lab Results   Component Value Date   CREATININE 0.73 11/10/2022   CREATININE  0.80 10/20/2022   CREATININE 0.71 10/06/2022    Recent Labs  Lab 11/10/22 1749  AST 93*  ALT 48*  ALKPHOS 103  BILITOT 1.0  PROT 5.8*  ALBUMIN 3.1*   Lab Results  Component Value Date   CALCIUM 8.2 (L) 11/10/2022          Plt: Lab Results  Component Value Date   PLT 53 (L) 11/10/2022       COVID-19 Labs  No results for input(s): "DDIMER", "FERRITIN", "LDH", "CRP" in the last 72 hours.  Lab Results  Component Value Date   SARSCOV2NAA NEGATIVE 12/21/2021   [place] NEGATIVE 11/30/2021     Arterial ***Venous  Blood Gas result:  pH *** pCO2 ***; pO2 ***;     %O2 Sat ***.  ABG    Component Value Date/Time   HCO3 22.7 06/27/2022 0823   ACIDBASEDEF 3.0 (H) 06/27/2022 0823   O2SAT 92.2 06/27/2022 0823         Recent Labs  Lab 11/10/22 1749  WBC 4.1  NEUTROABS 2.5  HGB 12.6*  HCT 38.0*  MCV 95.0  PLT 53*    HG/HCT * stable,  Down *Up from baseline see below    Component Value Date/Time   HGB 12.6 (L) 11/10/2022 1749   HGB 11.6 (L) 08/24/2022 1640   HCT 38.0 (L) 11/10/2022 1749   HCT 34.8 (L) 08/24/2022 1640   MCV 95.0 11/10/2022 1749   MCV 95 08/24/2022 1640      No results for input(s): "LIPASE", "AMYLASE" in the last 168 hours. No results for input(s): "AMMONIA" in the last 168 hours.    Cardiac Panel (last 3 results) No results for input(s): "CKTOTAL", "CKMB", "TROPONINI", "RELINDX" in the last 72 hours.  .car BNP (last 3 results) Recent Labs    12/21/21 1639 06/27/22 0823 10/04/22 1518  BNP 209.2* 51.4 212.2*      DM  labs:  HbA1C: Recent Labs    02/24/22 1632 05/26/22 1608 08/24/22 1640  HGBA1C 5.7* 5.6 5.2       CBG (last 3)  Recent Labs    11/10/22 2049  GLUCAP 103*          Cultures:    Component Value Date/Time   SDES BLOOD RIGHT ASSIST CONTROL 12/21/2021 1739   SDES BLOOD RIGHT FOREARM 12/21/2021 1739   SPECREQUEST  12/21/2021 1739     BOTTLES DRAWN AEROBIC AND ANAEROBIC Blood Culture results may not be optimal due to an excessive volume of blood received in culture bottles   SPECREQUEST  12/21/2021 1739    BOTTLES DRAWN AEROBIC AND ANAEROBIC Blood Culture results may not be optimal due to an excessive volume of blood received in culture bottles   CULT  12/21/2021 1739    NO GROWTH 5 DAYS Performed at [facility], [address], [place], [place] [address]    CULT  12/21/2021 1739    NO GROWTH 5 DAYS Performed at [facility], [address] [place], [place] [address]    REPTSTATUS 12/26/2021 FINAL 12/21/2021 1739   REPTSTATUS 12/26/2021 FINAL 12/21/2021 1739     Radiological Exams on Admission: DG Lumbar Spine Complete  Result Date: 11/10/2022 CLINICAL DATA:  [place]. EXAM: LUMBAR SPINE - COMPLETE 4+ VIEW COMPARISON:  None Available. FINDINGS: No fracture or traumatic malalignment. Multilevel degenerative disc disease, most prominent in the lower lumbar spine. Lower lumbar facet degenerative changes. No other bony or soft tissue abnormalities identified. IMPRESSION: No fracture or traumatic malalignment. Degenerative changes as  above. Electronically Signed   By: Dorise Bullion III M.D.   On: 11/10/2022 17:51   CT Head Wo Contrast  Result Date: 11/10/2022 CLINICAL DATA:  Seizures, new onset.  Head and facial trauma. EXAM: CT HEAD WITHOUT CONTRAST CT MAXILLOFACIAL WITHOUT CONTRAST TECHNIQUE: Multidetector CT imaging of the head and maxillofacial structures were performed using the standard protocol without intravenous contrast. Multiplanar CT image reconstructions of the maxillofacial structures were also generated. RADIATION DOSE REDUCTION: This exam was performed according to the departmental dose-optimization program which includes automated exposure control, adjustment of the mA and/or kV according to patient size and/or use of iterative reconstruction technique. COMPARISON:  None Available. FINDINGS:  CT HEAD FINDINGS Brain: No evidence of acute infarction, hemorrhage, hydrocephalus, extra-axial collection or mass lesion/mass effect. Mild generalized cerebral atrophy. Patchy area of low-attenuation of the periventricular white matter presumed chronic microvascular ischemic changes. Vascular: No hyperdense vessel or unexpected calcification. Skull: Normal. Negative for fracture or focal lesion. Other: None. CT MAXILLOFACIAL FINDINGS Osseous: No fracture or mandibular dislocation. No destructive process. Orbits: Negative. No traumatic or inflammatory finding. Sinuses: Marked mucosal thickening of the bilateral maxillary sinuses with partial opacification. Mucosal thickening of the ethmoid air cells. Remaining paranasal sinuses and mastoid air cells appear clear. Soft tissues: No significant hematoma or fluid collection. IMPRESSION: CT head: 1. No acute intracranial abnormality. 2. Mild generalized cerebral atrophy and chronic microvascular ischemic changes of the white matter. CT maxillofacial: 1. No acute facial bone fracture. 2. Bilateral maxillary and ethmoid sinus disease. Electronically Signed   By: Keane Police D.O.   On: 11/10/2022 17:33   CT Maxillofacial Wo Contrast  Result Date: 11/10/2022 CLINICAL DATA:  Seizures, new onset.  Head and facial trauma. EXAM: CT HEAD WITHOUT CONTRAST CT MAXILLOFACIAL WITHOUT CONTRAST TECHNIQUE: Multidetector CT imaging of the head and maxillofacial structures were performed using the standard protocol without intravenous contrast. Multiplanar CT image reconstructions of the maxillofacial structures were also generated. RADIATION DOSE REDUCTION: This exam was performed according to the departmental dose-optimization program which includes automated exposure control, adjustment of the mA and/or kV according to patient size and/or use of iterative reconstruction technique. COMPARISON:  None Available. FINDINGS: CT HEAD FINDINGS Brain: No evidence of acute infarction,  hemorrhage, hydrocephalus, extra-axial collection or mass lesion/mass effect. Mild generalized cerebral atrophy. Patchy area of low-attenuation of the periventricular white matter presumed chronic microvascular ischemic changes. Vascular: No hyperdense vessel or unexpected calcification. Skull: Normal. Negative for fracture or focal lesion. Other: None. CT MAXILLOFACIAL FINDINGS Osseous: No fracture or mandibular dislocation. No destructive process. Orbits: Negative. No traumatic or inflammatory finding. Sinuses: Marked mucosal thickening of the bilateral maxillary sinuses with partial opacification. Mucosal thickening of the ethmoid air cells. Remaining paranasal sinuses and mastoid air cells appear clear. Soft tissues: No significant hematoma or fluid collection. IMPRESSION: CT head: 1. No acute intracranial abnormality. 2. Mild generalized cerebral atrophy and chronic microvascular ischemic changes of the white matter. CT maxillofacial: 1. No acute facial bone fracture. 2. Bilateral maxillary and ethmoid sinus disease. Electronically Signed   By: Keane Police D.O.   On: 11/10/2022 17:33   _______________________________________________________________________________________________________ Latest  Blood pressure 133/89, pulse 84, temperature 98.2 F (36.8 C), temperature source Oral, resp. rate (!) 30, height 5' 10.5" (1.791 m), weight 95 kg, SpO2 95 %.   Vitals  labs and radiology finding personally reviewed  Review of Systems:    Pertinent positives include: ***  Constitutional:  No weight loss, night sweats, Fevers, chills, fatigue, weight loss  HEENT:  No headaches, Difficulty swallowing,Tooth/dental problems,Sore throat,  No sneezing, itching, ear ache, nasal congestion, post nasal drip,  Cardio-vascular:  No chest pain, Orthopnea, PND, anasarca, dizziness, palpitations.no Bilateral lower extremity swelling  GI:  No heartburn, indigestion, abdominal pain, nausea, vomiting, diarrhea, change  in bowel habits, loss of appetite, melena, blood in stool, hematemesis Resp:  no shortness of breath at rest. No dyspnea on exertion, No excess mucus, no productive cough, No non-productive cough, No coughing up of blood.No change in color of mucus.No wheezing. Skin:  no rash or lesions. No jaundice GU:  no dysuria, change in color of urine, no urgency or frequency. No straining to urinate.  No flank pain.  Musculoskeletal:  No joint pain or no joint swelling. No decreased range of motion. No back pain.  Psych:  No change in mood or affect. No depression or anxiety. No memory loss.  Neuro: no localizing neurological complaints, no tingling, no weakness, no double vision, no gait abnormality, no slurred speech, no confusion  All systems reviewed and apart from [place] all are negative _______________________________________________________________________________________________ Past Medical History:   Past Medical History:  Diagnosis Date  . Alcohol abuse   . Cervicalgia   . CHF (congestive heart failure) ([place])   . Hyperlipidemia   . Hypertension   . Seizures ([place])       Past Surgical History:  Procedure Laterality Date  . COLONOSCOPY WITH PROPOFOL N/A 01/16/2018   Procedure: COLONOSCOPY WITH PROPOFOL;  Surgeon: Lin Landsman, MD;  Location: [facility] ENDOSCOPY;  Service: Gastroenterology;  Laterality: N/A;  . ESOPHAGOGASTRODUODENOSCOPY (EGD) WITH PROPOFOL N/A 01/16/2018   Procedure: ESOPHAGOGASTRODUODENOSCOPY (EGD) WITH PROPOFOL;  Surgeon: Lin Landsman, MD;  Location: [facility] ENDOSCOPY;  Service: Gastroenterology;  Laterality: N/A;    Social History:  Ambulatory *** independently cane, walker  wheelchair bound, bed bound     reports that he has been smoking cigars and cigarettes. He started smoking about 37 years ago. He has a 33.00 pack-year smoking history. He has quit using smokeless tobacco. He reports current alcohol use of about 28.0 standard drinks of alcohol per  week. He reports that he does not use drugs.     Family History: *** Family History  Problem Relation Age of Onset  . Diabetes Mother   . Colon cancer Mother   . Hypertension Father   . Multiple sclerosis Father    ______________________________________________________________________________________________ Allergies: No Known Allergies   Prior to Admission medications   Medication Sig Start Date End Date Taking? Authorizing Provider  albuterol (VENTOLIN HFA) 108 (90 Base) MCG/ACT inhaler Inhale 2 puffs into the lungs every 6 (six) hours as needed for wheezing or shortness of breath. 06/27/22   Lucillie Garfinkel, MD  amLODipine (NORVASC) 10 MG tablet Take 1 tablet (10 mg total) by mouth daily. 04/18/22   Jon Billings, NP  carbamazepine (TEGRETOL) 200 MG tablet Take 1 tablet (200 mg total) by mouth 2 (two) times daily. 08/24/22   Jon Billings, NP  carvedilol (COREG) 25 MG tablet Take 1 tablet (25 mg total) by mouth 2 (two) times daily with a meal. 04/18/22   Jon Billings, NP  empagliflozin (JARDIANCE) 10 MG TABS tablet Take 1 tablet (10 mg total) by mouth daily before breakfast. 05/26/22   Jon Billings, NP  Fluticasone-Umeclidin-Vilant (TRELEGY ELLIPTA) 100-62.5-25 MCG/ACT AEPB Inhale 1 puff into the lungs daily. 08/24/22   Jon Billings, NP  furosemide (LASIX) 40 MG tablet Take 1 tablet (40 mg total) by mouth daily. 10/04/22   Hackney,  Aura Fey, FNP  hydrALAZINE (APRESOLINE) 50 MG tablet Take 1 tablet (50 mg total) by mouth 3 (three) times daily. 04/18/22   Jon Billings, NP  Iron, Ferrous Sulfate, 325 (65 Fe) MG TABS Take 325 mg by mouth daily. 08/24/22   Jon Billings, NP  isosorbide mononitrate (IMDUR) 30 MG 24 hr tablet Take 1 tablet (30 mg total) by mouth daily. Patient not taking: Reported on 10/20/2022 08/24/22   Jon Billings, NP  potassium chloride (MICRO-K) 10 MEQ CR capsule Take 1 capsule (10 mEq total) by mouth daily. 10/04/22   Alisa Graff, FNP   rosuvastatin (CRESTOR) 20 MG tablet Take 1 tablet (20 mg total) by mouth daily. 08/24/22   Jon Billings, NP  sacubitril-valsartan (ENTRESTO) 49-51 MG Take 1 tablet by mouth 2 (two) times daily. 10/20/22   Alisa Graff, FNP    ___________________________________________________________________________________________________ Physical Exam:    11/10/2022   11:45 PM 11/10/2022   10:00 PM 11/10/2022    9:30 PM  Vitals with BMI  Height  5' 10.5"   Weight  209 lbs 7 oz   BMI  65.78   Systolic 469  629  Diastolic 89  90  Pulse 84  86     1. General:  in No ***Acute distress***increased work of breathing ***complaining of severe pain****agitated * Chronically ill *well *cachectic *toxic acutely ill -appearing 2. Psychological: Alert and *** Oriented 3. Head/ENT:   Moist *** Dry Mucous Membranes                          Head Non traumatic, neck supple                          Normal *** Poor Dentition 4. SKIN: normal *** decreased Skin turgor,  Skin clean Dry and intact no rash 5. Heart: Regular rate and rhythm no*** Murmur, no Rub or gallop 6. Lungs: ***Clear to auscultation bilaterally, no wheezes or crackles   7. Abdomen: Soft, ***non-tender, Non distended *** obese ***bowel sounds present 8. Lower extremities: no clubbing, cyanosis, no ***edema 9. Neurologically Grossly intact, moving all 4 extremities equally *** strength 5 out of 5 in all 4 extremities cranial nerves II through XII intact 10. MSK: Normal range of motion    Chart has been reviewed  ______________________________________________________________________________________________  Assessment/Plan  ***  Admitted for *** Seizure ([place]) ***  New onset atrial fibrillation (HCC) ***  Thrombocytopenia (HCC) ***    Present on Admission: **None**     No problem-specific Assessment & Plan notes found for this encounter.    Other plan as per orders.  DVT prophylaxis:  SCD *** Lovenox       Code  Status:    Code Status: Prior FULL CODE *** DNR/DNI ***comfort care as per patient ***family  I had personally discussed CODE STATUS with patient and family* I had spent *min discussing goals of care and CODE STATUS    Family Communication:   Family not at  Bedside  plan of care was discussed on the phone with *** Son, Daughter, Wife, Husband, Sister, Brother , father, mother  Disposition Plan:   *** likely will need placement for rehabilitation                          Back to current facility when stable  To home once workup is complete and patient is stable  ***Following barriers for discharge:                            Electrolytes corrected                               Anemia corrected                             Pain controlled with PO medications                               Afebrile, white count improving able to transition to PO antibiotics                             Will need to be able to tolerate PO                            Will likely need home health, home O2, set up                           Will need consultants to evaluate patient prior to discharge  ****EXPECT DC tomorrow                    ***Would benefit from PT/OT eval prior to DC  Ordered                   Swallow eval - SLP ordered                   Diabetes care coordinator                   Transition of care consulted                   Nutrition    consulted                  Wound care  consulted                   Palliative care    consulted                   Behavioral health  consulted                    Consults called: ***    Admission status:  ED Disposition     None        Obs***  ***  inpatient     I Expect 2 midnight stay secondary to severity of patient's current illness need for inpatient interventions justified by the following: ***hemodynamic instability despite optimal treatment (tachycardia *hypotension * tachypnea *hypoxia, hypercapnia) *  Severe lab/radiological/exam abnormalities including:     and extensive comorbidities including: *substance abuse  *Chronic pain *DM2  * CHF * CAD  * COPD/asthma *Morbid Obesity * CKD *dementia *liver disease *history of stroke with residual deficits *  malignancy, * sickle cell disease  History of amputation Chronic anticoagulation  That are currently affecting medical management.   I expect  patient to be hospitalized for 2 midnights  requiring inpatient medical care.  Patient is at high risk for adverse outcome (such as loss of life or disability) if not treated.  Indication for inpatient stay as follows:  Severe change from baseline regarding mental status Hemodynamic instability despite maximal medical therapy,  ongoing suicidal ideations,  severe pain requiring acute inpatient management,  inability to maintain oral hydration   persistent chest pain despite medical management Need for operative/procedural  intervention New or worsening hypoxia   Need for IV antibiotics, IV fluids, IV rate controling medications, IV antihypertensives, IV pain medications, IV anticoagulation, need for biPAP    Level of care   *** tele  For 12H 24H     medical floor       progressive tele indefinitely please discontinue once patient no longer qualifies COVID-19 Labs    Lab Results  Component Value Date   [place] NEGATIVE 12/21/2021     Precautions: admitted as *** Covid Negative  ***asymptomatic screening protocol****PUI *** covid positive No active isolations ***If Covid PCR is negative  - please DC precautions - would need additional investigation given very high risk for false native test result    Critical***  Patient is critically ill due to  hemodynamic instability * respiratory failure *severe sepsis* ongoing chest pain*  They are at high risk for life/limb threatening clinical deterioration requiring frequent reassessment and modifications of care.  Services  provided include examination of the patient, review of relevant ancillary tests, prescription of lifesaving therapies, review of medications and prophylactic therapy.  Total critical care time excluding separately billable procedures: 60*  Minutes.    Edoardo Laforte 11/11/2022, 12:00 AM ***  [facility]     after 2 AM please page floor coverage PA If 7AM-7PM, please contact the day team taking care of the patient using [email or web address]   Patient was evaluated in the context of the global COVID-19 pandemic, which necessitated consideration that the patient might be at risk for infection with the SARS-CoV-2 virus that causes COVID-19. Institutional protocols and algorithms that pertain to the evaluation of patients at risk for COVID-19 are in a state of rapid change based on information released by regulatory bodies including the CDC and federal and state organizations. These policies and algorithms were followed during the patient's care.

## 2022-11-11 NOTE — ED Notes (Signed)
Notified Singh MD about  pt HR

## 2022-11-11 NOTE — Assessment & Plan Note (Addendum)
History of seizure disorder but has not been compliant with Tegretol according to prior notes.  Unclear if this was seizure Reports now is compliant but Tegretol level is low Possibly in the setting of recent alcohol use Denies decreasing EtOH  States gets a seizure every 1 year   Per neurology will load with keppra 2000 mg iv Defer to NEurology regarding long term tegretol management Seizure precautions  Eeg in AM

## 2022-11-11 NOTE — ED Notes (Signed)
Singh MD at bedside

## 2022-11-11 NOTE — ED Notes (Signed)
Placed pt back on cardiac monitor. Increased O2 to 3L, per pt he wears 3L all the time

## 2022-11-11 NOTE — ED Provider Notes (Signed)
Received from bow tran PA-C please see note for full detail  In short patient with medical history including alcoholic seizures, hypertension, CHF, hyperlipidemia, presents after seizure-like activity, was found to be hypotensive, postictal.  Patient states that he had a seizure today, states that last was about 6 months ago, states he has not been compliant with his antiseizure medication, states that he drinks daily about half liter of liquor, he states that he drank a lot 2 days ago for his birthday, he has not had a drink since then.  Per previous provider admit to medicine for alcoholic seizures   Physical Exam  BP (!) 126/94   Pulse (!) 41   Temp 98 F (36.7 C) (Oral)   Resp (!) 33   Ht 5' 10.5" (1.791 m)   Wt 95 kg   SpO2 97%   BMI 29.63 kg/m   Physical Exam Vitals and nursing note reviewed.  Constitutional:      General: He is not in acute distress.    Appearance: He is not ill-appearing.  HENT:     Head: Normocephalic and atraumatic.     Nose: No congestion.  Eyes:     Conjunctiva/sclera: Conjunctivae normal.  Cardiovascular:     Rate and Rhythm: Tachycardia present. Rhythm irregular.  Pulmonary:     Effort: Pulmonary effort is normal.  Skin:    General: Skin is warm and dry.  Neurological:     Mental Status: He is alert.     Comments: No facial asymmetry no difficulty with word finding following two-step commands there is no unilateral weakness present.  Psychiatric:        Mood and Affect: Mood normal.     Procedures  Procedures  ED Course / MDM    Medical Decision Making Amount and/or Complexity of Data Reviewed Labs: ordered. Radiology: ordered.  Risk OTC drugs. Prescription drug management. Decision regarding hospitalization.     Lab Tests:  I Ordered, and personally interpreted labs.  The pertinent results include: CBC shows normocytic anemia hemoglobin 10.6, CMP shows glucose of 107 BUN 26 AST 93 ALT 48 ethanol less than 10 CBG 103  carbamazepine less than 2   Imaging Studies ordered:  I ordered imaging studies including CT head, maxillofacial, CT C-spine I independently visualized and interpreted imaging which showed all negative acute findings I agree with the radiologist interpretation   Cardiac Monitoring:  The patient was maintained on a cardiac monitor.  I personally viewed and interpreted the cardiac monitored which showed an underlying rhythm of: A-fib without signs of ischemia   Medicines ordered and prescription drug management:  I ordered medication including N/A I have reviewed the patients home medicines and have made adjustments as needed  Critical Interventions:  N/A   Reevaluation:  Presents with seizures, benign physical exam, will consult hospitalist for admission  Consultations Obtained:  I requested consultation with the hospitalist Dr. Roel Cluck ,  and discussed lab and imaging findings as well as pertinent plan - they recommend: We will admit the patient would like consultation to neurology for further management of seizures Spoke with Sal of neurology he will come and assess the patient.   Dispostion and problem list  After consideration of the diagnostic results and the patients response to treatment, I feel that the patent would benefit from admission.  Seizures-likely multifactorial, noncompliance with antiepileptic medication as well as alcoholic seizures will need evaluation by neurology and further assessment. A-fib-likely after excessive alcohol consumption withdrawals likely need evaluation by cardiology  Alcohol withdrawals-continue with CIWA protocol outpatient follow-up.           Marcello Fennel, PA-C 34/62/19 [id number]    Delora Fuel, MD 52/71/29 [phone]

## 2022-11-11 NOTE — ED Notes (Signed)
Pt removing EKG leads after applying x2. RN Raquel Sarna made aware.

## 2022-11-11 NOTE — Assessment & Plan Note (Signed)
Albuterol as needed check VBG to make sure is no CO2 retention in the setting of oversedation.  Patient has required BiPAP in the past

## 2022-11-11 NOTE — Assessment & Plan Note (Signed)
-   currently appears to be slightly on the dry side, hold home diuretics for tonight and restart when appears euvolemic, carefuly follow fluid status and Cr  

## 2022-11-11 NOTE — Assessment & Plan Note (Signed)
When patient is awake will discuss risks of smoking

## 2022-11-11 NOTE — Assessment & Plan Note (Signed)
Patient received Ativan for possible CIWA protocol Now fairly somnolent.  Given history of COPD will obtain VBG Ammonia level May benefit from EEG to make sure that there is no ongoing seizure state

## 2022-11-11 NOTE — ED Notes (Signed)
Took over pt care at this time. Per report pt noncompliant w/ monitoring equipment. Asked RN prior to leaving to attempt to hook pt up to monitoring equipment

## 2022-11-11 NOTE — Assessment & Plan Note (Deleted)
-   currently appears to be slightly on the dry side, hold home diuretics for tonight and restart when appears euvolemic, carefuly follow fluid status and Cr  

## 2022-11-11 NOTE — Assessment & Plan Note (Signed)
Will replace and recheck in AM

## 2022-11-12 DIAGNOSIS — Z5309 Procedure and treatment not carried out because of other contraindication: Secondary | ICD-10-CM

## 2022-11-12 DIAGNOSIS — B338 Other specified viral diseases: Secondary | ICD-10-CM

## 2022-11-12 DIAGNOSIS — I4891 Unspecified atrial fibrillation: Secondary | ICD-10-CM | POA: Diagnosis not present

## 2022-11-12 DIAGNOSIS — G40909 Epilepsy, unspecified, not intractable, without status epilepticus: Secondary | ICD-10-CM | POA: Diagnosis not present

## 2022-11-12 DIAGNOSIS — D696 Thrombocytopenia, unspecified: Secondary | ICD-10-CM | POA: Diagnosis not present

## 2022-11-12 LAB — CBC WITH DIFFERENTIAL/PLATELET
Abs Immature Granulocytes: 0.01 10*3/uL (ref 0.00–0.07)
Basophils Absolute: 0 10*3/uL (ref 0.0–0.1)
Basophils Relative: 0 %
Eosinophils Absolute: 0.1 10*3/uL (ref 0.0–0.5)
Eosinophils Relative: 2 %
HCT: 33.3 % — ABNORMAL LOW (ref 39.0–52.0)
Hemoglobin: 11.1 g/dL — ABNORMAL LOW (ref 13.0–17.0)
Immature Granulocytes: 0 %
Lymphocytes Relative: 23 %
Lymphs Abs: 1.1 10*3/uL (ref 0.7–4.0)
MCH: 31.5 pg (ref 26.0–34.0)
MCHC: 33.3 g/dL (ref 30.0–36.0)
MCV: 94.6 fL (ref 80.0–100.0)
Monocytes Absolute: 0.7 10*3/uL (ref 0.1–1.0)
Monocytes Relative: 15 %
Neutro Abs: 3 10*3/uL (ref 1.7–7.7)
Neutrophils Relative %: 60 %
Platelets: 49 10*3/uL — ABNORMAL LOW (ref 150–400)
RBC: 3.52 MIL/uL — ABNORMAL LOW (ref 4.22–5.81)
RDW: 15.4 % (ref 11.5–15.5)
WBC: 4.9 10*3/uL (ref 4.0–10.5)
nRBC: 0 % (ref 0.0–0.2)

## 2022-11-12 LAB — BASIC METABOLIC PANEL
Anion gap: 11 (ref 5–15)
BUN: 7 mg/dL (ref 6–20)
CO2: 22 mmol/L (ref 22–32)
Calcium: 7.9 mg/dL — ABNORMAL LOW (ref 8.9–10.3)
Chloride: 101 mmol/L (ref 98–111)
Creatinine, Ser: 0.58 mg/dL — ABNORMAL LOW (ref 0.61–1.24)
GFR, Estimated: >60 mL/min (ref >60)
Glucose, Bld: 120 mg/dL — ABNORMAL HIGH (ref 70–99)
Potassium: 3.7 mmol/L (ref 3.5–5.1)
Sodium: 134 mmol/L — ABNORMAL LOW (ref 135–145)

## 2022-11-12 LAB — BRAIN NATRIURETIC PEPTIDE: B Natriuretic Peptide: 838.6 pg/mL — ABNORMAL HIGH (ref 0.0–100.0)

## 2022-11-12 LAB — MAGNESIUM: Magnesium: 1.4 mg/dL — ABNORMAL LOW (ref 1.7–2.4)

## 2022-11-12 MED ORDER — POTASSIUM CHLORIDE CRYS ER 20 MEQ PO TBCR
30.0000 meq | EXTENDED_RELEASE_TABLET | ORAL | Status: AC
  Administered 2022-11-12 (×2): 30 meq via ORAL
  Filled 2022-11-12 (×2): qty 1

## 2022-11-12 MED ORDER — ENSURE ENLIVE PO LIQD
237.0000 mL | Freq: Two times a day (BID) | ORAL | Status: DC
  Administered 2022-11-14 – 2022-11-17 (×4): 237 mL via ORAL

## 2022-11-12 MED ORDER — DIGOXIN 0.25 MG/ML IJ SOLN: 0.2500 mg | Freq: Once | INTRAMUSCULAR | Status: DC

## 2022-11-12 MED ORDER — DILTIAZEM HCL 30 MG PO TABS
120.0000 mg | ORAL_TABLET | Freq: Three times a day (TID) | ORAL | Status: DC
  Administered 2022-11-12 – 2022-11-13 (×3): 120 mg via ORAL
  Filled 2022-11-12 (×3): qty 4

## 2022-11-12 MED ORDER — DILTIAZEM HCL 25 MG/5ML IV SOLN
10.0000 mg | Freq: Once | INTRAVENOUS | Status: AC
  Administered 2022-11-12: 10 mg via INTRAVENOUS
  Filled 2022-11-12: qty 5

## 2022-11-12 MED ORDER — MAGNESIUM SULFATE 4 GM/100ML IV SOLN
4.0000 g | Freq: Once | INTRAVENOUS | Status: AC
  Administered 2022-11-12: 4 g via INTRAVENOUS
  Filled 2022-11-12: qty 100

## 2022-11-12 MED ORDER — DILTIAZEM HCL 25 MG/5ML IV SOLN
10.0000 mg | INTRAVENOUS | Status: DC | PRN
  Administered 2022-11-13 – 2022-11-15 (×4): 10 mg via INTRAVENOUS
  Filled 2022-11-12 (×6): qty 5

## 2022-11-12 MED ORDER — DIGOXIN 0.25 MG/ML IJ SOLN
0.2500 mg | Freq: Once | INTRAMUSCULAR | Status: AC
  Administered 2022-11-12: 0.25 mg via INTRAVENOUS
  Filled 2022-11-12: qty 1

## 2022-11-12 NOTE — Plan of Care (Deleted)
Discussed sxs of [place] and TIA. Discussed importance of calling 911 when any of the sxs occur because getting to the hosp within 4 hours of the start of the sxs gives you a better chance of a better outcome. Explained some of the meds she may be placed on to help to reduce further events from correctable causes for a Stroke or TIA

## 2022-11-12 NOTE — Progress Notes (Signed)
Initial Nutrition Assessment  DOCUMENTATION CODES:   Not applicable  INTERVENTION:  - Soft diet per MD as medically appropriate.  - Ensure Plus High Protein po BID, each supplement provides 350 kcal and 20 grams of protein. - Multivitamin, folic acid, and thiamine due to history of alcohol abuse. - Monitor weight trends.    NUTRITION DIAGNOSIS:   Increased nutrient needs related to acute illness as evidenced by estimated needs.  GOAL:   Patient will meet greater than or equal to 90% of their needs  MONITOR:   PO intake, Diet advancement, Supplement acceptance, Weight trends  REASON FOR ASSESSMENT:   Consult Assessment of nutrition requirement/status  ASSESSMENT:   52 y.o. male with medical history significant of chronic diastolic CHF, COPD, tobacco alcohol abuse, HTN who presented with was found down after seizure from noncompliance with antiepileptic medications and ongoing alcohol abuse.    Unable to speak with patient. Per chart review weight had been trending up since August but possible weight loss within the past ~1 month. No intake documented yet this admission. Patient would likely benefit from nutrition supplements, will order.    Medications reviewed and include: Folic acid, MVI, thiamine  Labs reviewed:  Na 134 Magnesium 1.4    NUTRITION - FOCUSED PHYSICAL EXAM:  RD working remotely  Diet Order:   Diet Order             DIET SOFT Fluid consistency: Thin  Diet effective now                   EDUCATION NEEDS:  Not appropriate for education at this time  Skin:  Skin Assessment: Reviewed RN Assessment  Last BM:  1/6  Height:  Ht Readings from Last 1 Encounters:  11/11/22 '5\' 10"'$  (1.778 m)   Weight:  Wt Readings from Last 1 Encounters:  11/11/22 91.2 kg   BMI:  Body mass index is 28.85 kg/m.  Estimated Nutritional Needs:  Kcal:  6948-5462 kcals Protein:  90-110 grams Fluid:  >/= 2.3 L    Samson Frederic RD, LDN For contact  information, refer to [facility].

## 2022-11-12 NOTE — Evaluation (Signed)
Physical Therapy Evaluation and Discharge Patient Details Name: Jose [name]. MRN: [id number] DOB: Feb 16, 1971 Today's Date: 11/12/2022  History of Present Illness  Patient is a 52 yo male presenting to the ED with seizures, and hypotensive on 11/10/21.Found to have Afib with heparin administered on 11/11/21. PMH: alcohol abuse, alcohol-related seizure, hypertension, CHF, hyperlipidemia, tobacco use  Clinical Impression   Patient evaluated by Physical Therapy with no further acute PT needs identified. Patient is independent with mobility and scored 53/56 on Berg Balance Assessment.  PT is signing off. Thank you for this referral.        Recommendations for follow up therapy are one component of a multi-disciplinary discharge planning process, led by the attending physician.  Recommendations may be updated based on patient status, additional functional criteria and insurance authorization.  Follow Up Recommendations No PT follow up      Assistance Recommended at Discharge None  Patient can return home with the following       Equipment Recommendations None recommended by PT  Recommendations for Other Services       Functional Status Assessment Patient has not had a recent decline in their functional status     Precautions / Restrictions Precautions Precautions: Other (comment) Precaution Comments: watch HR Restrictions Weight Bearing Restrictions: No      Mobility  Bed Mobility Overal bed mobility: Independent                  Transfers Overall transfer level: Independent Equipment used: None Transfers: Sit to/from Stand Sit to Stand: Supervision, Independent                Ambulation/Gait Ambulation/Gait assistance: Independent Gait Distance (Feet): 100 Feet Assistive device: None Gait Pattern/deviations: WFL(Within Functional Limits)   Gait velocity interpretation: >2.62 ft/sec, indicative of community ambulatory   General Gait Details: slightly slow  but in small space in room  Stairs            Wheelchair Mobility    Modified Rankin (Stroke Patients Only)       Balance                                 Standardized Balance Assessment Standardized Balance Assessment : Berg Balance Test Berg Balance Test Sit to Stand: Able to stand without using hands and stabilize independently Standing Unsupported: Able to stand safely 2 minutes Sitting with Back Unsupported but Feet Supported on Floor or Stool: Able to sit safely and securely 2 minutes Stand to Sit: Sits safely with minimal use of hands Transfers: Able to transfer safely, minor use of hands Standing Unsupported with Eyes Closed: Able to stand 10 seconds safely Standing Ubsupported with Feet Together: Able to place feet together independently and stand 1 minute safely From Standing, Reach Forward with Outstretched Arm: Can reach confidently >25 cm (10") From Standing Position, Pick up Object from Floor: Able to pick up shoe safely and easily From Standing Position, Turn to Look Behind Over each Shoulder: Looks behind from both sides and weight shifts well Turn 360 Degrees: Able to turn 360 degrees safely in 4 seconds or less Standing Unsupported, Alternately Place Feet on Step/Stool: Able to stand independently and safely and complete 8 steps in 20 seconds Standing Unsupported, One Foot in Front: Able to plae foot ahead of the other independently and hold 30 seconds Standing on One Leg: Able to lift leg independently and hold equal to  or more than 3 seconds Total Score: 53         Pertinent Vitals/Pain Pain Assessment Pain Assessment: No/denies pain    Home Living Family/patient expects to be discharged to:: Private residence Living Arrangements: Parent;Other relatives (nephew) Available Help at Discharge: Family;Available PRN/intermittently Type of Home: House Home Access: Stairs to enter Entrance Stairs-Rails: Right;Left Entrance Stairs-Number of  Steps: 3 Alternate Level Stairs-Number of Steps: He has an apartment in the basement with full bath. 13 steps down to basement or can reach from outside. . Home Layout: Two level;Able to live on main level with bedroom/bathroom Home Equipment: Rolling Walker (2 wheels);Cane - single point;Shower seat - built in Additional Comments: unsure of location of equipment as he does not use    Prior Function Prior Level of Function : Independent/Modified Independent             Mobility Comments: Reports no AD ADLs Comments: Reports indpednent but does not drive     Hand Dominance   Dominant Hand: Right    Extremity/Trunk Assessment   Upper Extremity Assessment Upper Extremity Assessment: Defer to OT evaluation    Lower Extremity Assessment Lower Extremity Assessment: Overall WFL for tasks assessed    Cervical / Trunk Assessment Cervical / Trunk Assessment: Normal  Communication   Communication: No difficulties  Cognition Arousal/Alertness: Awake/alert Behavior During Therapy: WFL for tasks assessed/performed Overall Cognitive Status: Within Functional Limits for tasks assessed                                          General Comments General comments (skin integrity, edema, etc.): placed on room air for ambulatory oxygen saturation assessment (see separate note); HR 106-140 afib    Exercises     Assessment/Plan    PT Assessment Patient does not need any further PT services  PT Problem List         PT Treatment Interventions      PT Goals (Current goals can be found in the Care Plan section)  Acute Rehab PT Goals Patient Stated Goal: go home soon PT Goal Formulation: All assessment and education complete, DC therapy    Frequency       Co-evaluation               AM-PAC PT "6 Clicks" Mobility  Outcome Measure Help needed turning from your back to your side while in a flat bed without using bedrails?: None Help needed moving from lying  on your back to sitting on the side of a flat bed without using bedrails?: None Help needed moving to and from a bed to a chair (including a wheelchair)?: None Help needed standing up from a chair using your arms (e.g., wheelchair or bedside chair)?: None Help needed to walk in hospital room?: None Help needed climbing 3-5 steps with a railing? : None 6 Click Score: 24    End of Session   Activity Tolerance: Patient tolerated treatment well Patient left: in bed;with call bell/phone within reach;with bed alarm set Nurse Communication: Mobility status;Other (comment) (ambulatory sats 92% and RN ok wiht leaving O2 off) PT Visit Diagnosis: Difficulty in walking, not elsewhere classified (R26.2)    Time: 7824-2353 PT Time Calculation (min) (ACUTE ONLY): 12 min   Charges:   PT Evaluation $PT Eval Low Complexity: 1 Low           Arby Barrette, PT  Acute Rehabilitation Services  Office [phone]   Rexanne Mano 11/12/2022, 11:10 AM

## 2022-11-12 NOTE — Plan of Care (Signed)
  Problem: Education: Goal: Expressions of having a comfortable level of knowledge regarding the disease process will increase Outcome: Progressing   

## 2022-11-12 NOTE — Progress Notes (Signed)
PROGRESS NOTE                                                                                                                                                                                                             Patient Demographics:    Jose [name], is a 52 y.o. male, DOB - October 29, 1971, IWL:[id number]  Outpatient Primary MD for the patient is Jon Billings, NP    LOS - 1  Admit date - 11/10/2022    Chief Complaint  Patient presents with   Seizures       Brief Narrative (HPI from H&P)   52 y.o. male with medical history significant of   chronic diastolic CHF, COPD, tobacco alcohol abuse, hypertension, seizures not compliant with his medications.  Patient was not feeling good for the last few days and had cut down his alcohol intake, he subsequently started getting jittery and felt like he was going to have a seizure. He subsequently had a seizure-like episode and was brought to the ER, in the ER he was also found to have new onset A-fib RVR.  He was seen by neurology.  Head CT was nonacute.  He was admitted to the hospital for further care.    Subjective:    Jose [name] today has, No headache, No chest pain, No abdominal pain - No Nausea, No new weakness tingling or numbness, no SOB.   Assessment  & Plan :   Breakthrough seizure in a patient with history of seizures, noncompliant with antiepileptic medications and ongoing alcohol abuse. He was noncompliant with his seizure medications and was also abruptly trying to taper off his alcohol intake, likely seizure due to combination of alcohol withdrawal and subtherapeutic levels of his antiseizure medications.  Head CT stable, EEG nonacute, his home medication, for the next 3 days he will get Keppra overlap.  Has been counseled on compliance.  Seen by neurology.  Has been counseled not to drive for 6 months.   Ongoing alcohol and nicotine abuse.  Currently in early DTs and  alcohol withdrawal.  Placed on Librium along with CIWA protocol.  Counseled to quit.  New onset A-fib RVR.  Mali vas 2 score of 3.  He is an alcoholic, due to his underlying thrombocytopenia, noncompliance with his medications he is a poor candidate for anticoagulation long-term, currently in RVR, given  IV Cardizem push, will give him 1 more dose of IV digoxin, placed on oral Cardizem and dose adjusted further on 11/12/2022.  TSH stable.  Echo noted.  Telemetry monitoring.  Cardiology on board.  Hypertension.  Blood pressure currently soft continue on Cardizem and monitor.  Gentle IV fluids.  Acute on chronic thrombocytopenia.  Likely due to underlying alcohol use and abuse.  Ultrasound suggestive of fatty liver.  Outpatient monitoring by PCP.  Acute metabolic encephalopathy.  Likely postictal as well.  Stable now.  May worsen again as he is going into DTs.  Asymptomatic transaminitis with fatty liver on ultrasound.  Due to alcohol abuse.  Counseled to quit.  COPD.  Stable.  Chronic diastolic CHF last EF 32%.  Currently dehydrated being hydrated with IV fluids.  Fall with right knee pain.  X-ray shows chronic changes.  Supportive care.  PT OT.  Hypomagnesemia.  Replaced.        Condition - Fair  Family Communication  : None present  Code Status :  Full  Consults  :  Neuro, Cards  PUD Prophylaxis : PPI   Procedures  :     TTE -   1. LVEF challenging with afib/ PVC. Left ventricular ejection fraction, by estimation, is 50 to 55%. The left ventricle has low normal function. The left ventricle has no regional wall motion abnormalities. There is mild left ventricular hypertrophy. Left ventricular diastolic parameters are indeterminate.  2. Right ventricular systolic function is normal. The right ventricular size is normal. There is mildly elevated pulmonary artery systolic pressure.  3. Left atrial size was moderately dilated.  4. Right atrial size was moderately dilated.  5. Trivial  mitral valve regurgitation.  6. The aortic valve is tricuspid. Aortic valve regurgitation is not visualized.  7. There is mild dilatation of the ascending aorta, measuring 39 mm.  8. The inferior vena cava is normal in size with greater than 50% respiratory variability, suggesting right atrial pressure of 3 mmHg.   EEG - Non acute  Right upper quadrant ultrasound.  Fatty liver.    CT head - non acute      Disposition Plan  :    Status is: Observation  DVT Prophylaxis  : SCDs    Lab Results  Component Value Date   PLT 49 (L) 11/12/2022    Diet :  Diet Order             DIET SOFT Fluid consistency: Thin  Diet effective now                    Inpatient Medications  Scheduled Meds:  carbamazepine  200 mg Oral BID   chlordiazePOXIDE  15 mg Oral TID   diclofenac Sodium  2 g Topical QID   digoxin  0.25 mg Intravenous Once   diltiazem  120 mg Oral Q8H   fluticasone furoate-vilanterol  1 puff Inhalation Daily   And   umeclidinium bromide  1 puff Inhalation Daily   folic acid  1 mg Oral Daily   levETIRAcetam  500 mg Oral BID   multivitamin with minerals  1 tablet Oral Daily   nicotine  21 mg Transdermal Daily   pantoprazole  40 mg Oral Daily   [START ON 11/19/2022] thiamine (VITAMIN B1) injection  100 mg Intravenous Daily   Continuous Infusions:  levETIRAcetam Stopped (11/11/22 2240)   thiamine (VITAMIN B1) injection 500 mg (11/12/22 0526)   Followed by   Derrill Memo ON 11/13/2022] thiamine (VITAMIN  B1) injection     PRN Meds:.acetaminophen **OR** acetaminophen, albuterol, diltiazem, HYDROcodone-acetaminophen, LORazepam **OR** LORazepam, LORazepam  Antibiotics  :    Anti-infectives (From admission, onward)    None         Objective:   Vitals:   11/11/22 2155 11/11/22 2307 11/12/22 0349 11/12/22 0758  BP: (!) 155/88 (!) 125/92 122/88 114/78  Pulse: (!) 106 87 (!) 107 86  Resp: '15 18 18 19  '$ Temp: 97.8 F (36.6 C) 98.9 F (37.2 C) 98.9 F (37.2 C) 98.7 F  (37.1 C)  TempSrc: Oral Oral Oral Oral  SpO2:  98% 98% 97%  Weight: 91.2 kg     Height: '5\' 10"'$  (1.778 m)       Wt Readings from Last 3 Encounters:  11/11/22 91.2 kg  10/20/22 95 kg  10/06/22 96.6 kg     Intake/Output Summary (Last 24 hours) at 11/12/2022 1030 Last data filed at 11/12/2022 0706 Gross per 24 hour  Intake 2435.76 ml  Output 700 ml  Net 1735.76 ml     Physical Exam  Awake Alert, No new F.N deficits, Normal affect [place].AT,PERRAL Supple Neck, No JVD,   Symmetrical Chest wall movement, Good air movement bilaterally, CTAB iRRR,No Gallops, Rubs or new Murmurs,  +ve B.Sounds, Abd Soft, No tenderness,   No Cyanosis, Clubbing or edema      Data Review:    Recent Labs  Lab 11/10/22 1749 11/11/22 0114 11/11/22 0352 11/11/22 0630 11/12/22 0545  WBC 4.1  --  4.5 4.2 4.9  HGB 12.6* 12.9* 11.7* 11.6* 11.1*  HCT 38.0* 38.0* 34.1* 33.6* 33.3*  PLT 53*  --  52* 49* 49*  MCV 95.0  --  93.2 93.3 94.6  MCH 31.5  --  32.0 32.2 31.5  MCHC 33.2  --  34.3 34.5 33.3  RDW 15.4  --  15.4 15.5 15.4  LYMPHSABS 1.0  --   --   --  1.1  MONOABS 0.5  --   --   --  0.7  EOSABS 0.0  --   --   --  0.1  BASOSABS 0.0  --   --   --  0.0    Recent Labs  Lab 11/10/22 1749 11/11/22 0004 11/11/22 0114 11/11/22 0429 11/11/22 0630 11/12/22 0545  NA 137  --  138  --  136 134*  K 4.2  --  3.6  --  3.8 3.7  CL 101  --   --   --  103 101  CO2 22  --   --   --  24 22  ANIONGAP 14  --   --   --  9 11  GLUCOSE 107*  --   --   --  81 120*  BUN 26*  --   --   --  18 7  CREATININE 0.73  --   --   --  0.69 0.58*  AST 93*  --   --   --   --   --   ALT 48*  --   --   --   --   --   ALKPHOS 103  --   --   --   --   --   BILITOT 1.0  --   --   --   --   --   ALBUMIN 3.1*  --   --   --   --   --   LATICACIDVEN  --  0.9  --  0.8  --   --  INR  --  1.2  --   --   --   --   TSH  --  1.436  --   --   --   --   AMMONIA  --  39*  --   --   --   --   BNP  --   --   --   --  524.6* 838.6*  MG   --  1.0*  --   --   --  1.4*  CALCIUM 8.2*  --   --   --  7.6* 7.9*       Radiology Reports ECHOCARDIOGRAM COMPLETE  Result Date: 11/11/2022    ECHOCARDIOGRAM REPORT   Patient Name:   Jose [name]. Date of Exam: 11/11/2022 Medical Rec #:  [id number]      Height:       70.5 in Accession #:    [id number]     Weight:       209.4 lb Date of Birth:  [birth year]       BSA:          2.140 m Patient Age:    62 years       BP:           126/99 mmHg Patient Gender: M              HR:           115 bpm. Exam Location:  Inpatient Procedure: 2D Echo, Cardiac Doppler and Color Doppler Indications:    I48.91* Unspeicified atrial fibrillation  History:        Patient has prior history of Echocardiogram examinations, most                 recent 11/05/2021. CHF; Risk Factors:Hypertension, Dyslipidemia                 and ETOH.  Sonographer:    Bernadene Person RDCS Referring Phys: Toy Baker IMPRESSIONS  1. LVEF challenging with afib/ PVC. Left ventricular ejection fraction, by estimation, is 50 to 55%. The left ventricle has low normal function. The left ventricle has no regional wall motion abnormalities. There is mild left ventricular hypertrophy. Left ventricular diastolic parameters are indeterminate.  2. Right ventricular systolic function is normal. The right ventricular size is normal. There is mildly elevated pulmonary artery systolic pressure.  3. Left atrial size was moderately dilated.  4. Right atrial size was moderately dilated.  5. Trivial mitral valve regurgitation.  6. The aortic valve is tricuspid. Aortic valve regurgitation is not visualized.  7. There is mild dilatation of the ascending aorta, measuring 39 mm.  8. The inferior vena cava is normal in size with greater than 50% respiratory variability, suggesting right atrial pressure of 3 mmHg. FINDINGS  Left Ventricle: LVEF challenging with afib/ PVC. Left ventricular ejection fraction, by estimation, is 50 to 55%. The left ventricle has low normal  function. The left ventricle has no regional wall motion abnormalities. The left ventricular internal cavity size was normal in size. There is mild left ventricular hypertrophy. Left ventricular diastolic parameters are indeterminate. Right Ventricle: The right ventricular size is normal. Right ventricular systolic function is normal. There is mildly elevated pulmonary artery systolic pressure. The tricuspid regurgitant velocity is 2.87 m/s, and with an assumed right atrial pressure of 8 mmHg, the estimated right ventricular systolic pressure is 84.1 mmHg. Left Atrium: Left atrial size was moderately dilated. Right Atrium: Right atrial size was moderately dilated. Pericardium:  There is no evidence of pericardial effusion. Mitral Valve: Trivial mitral valve regurgitation. Tricuspid Valve: Tricuspid valve regurgitation is mild. Aortic Valve: The aortic valve is tricuspid. Aortic valve regurgitation is not visualized. Aortic regurgitation PHT measures 388 msec. Pulmonic Valve: Pulmonic valve regurgitation is not visualized. Aorta: There is mild dilatation of the ascending aorta, measuring 39 mm. Venous: The inferior vena cava is normal in size with greater than 50% respiratory variability, suggesting right atrial pressure of 3 mmHg. IAS/Shunts: The interatrial septum was not well visualized.  LEFT VENTRICLE PLAX 2D LVIDd:         5.00 cm LVIDs:         3.50 cm LV PW:         1.10 cm LV IVS:        1.00 cm LVOT diam:     2.50 cm LV SV:         53 LV SV Index:   25 LVOT Area:     4.91 cm  LV Volumes (MOD) LV vol d, MOD A2C: 135.0 ml LV vol d, MOD A4C: 136.0 ml LV vol s, MOD A2C: 79.7 ml LV vol s, MOD A4C: 69.7 ml LV SV MOD A2C:     55.3 ml LV SV MOD A4C:     136.0 ml LV SV MOD BP:      62.5 ml RIGHT VENTRICLE TAPSE (M-mode): 1.8 cm LEFT ATRIUM              Index        RIGHT ATRIUM           Index LA diam:        4.20 cm  1.96 cm/m   RA Area:     26.40 cm LA Vol (A2C):   93.7 ml  43.79 ml/m  RA Volume:   88.80 ml   41.50 ml/m LA Vol (A4C):   101.0 ml 47.20 ml/m LA Biplane Vol: 98.5 ml  46.03 ml/m  AORTIC VALVE LVOT Vmax:   67.42 cm/s LVOT Vmean:  45.950 cm/s LVOT VTI:    0.108 m AI PHT:      388 msec  AORTA Ao Root diam: 4.40 cm Ao Asc diam:  3.90 cm TRICUSPID VALVE TR Peak grad:   32.9 mmHg TR Vmax:        287.00 cm/s  SHUNTS Systemic VTI:  0.11 m Systemic Diam: 2.50 cm Phineas Inches Electronically signed by Phineas Inches Signature Date/Time: 11/11/2022/11:18:12 AM    Final    EEG adult  Result Date: 11/11/2022 Lora Havens, MD     11/11/2022  8:36 AM Patient Name: Jose [name]. MRN: [id number] Epilepsy Attending: Lora Havens Referring Physician/Provider: Toy Baker, MD Date: 11/11/2022 Duration: 22.04 mins Patient history: 52yo M with h/o epilepsy came with seizure. EEG to evaluate for seizure. Level of alertness: Awake, asleep AEDs during EEG study: CBZ, LEV Technical aspects: This EEG study was done with scalp electrodes positioned according to the 10-20 International system of electrode placement. Electrical activity was reviewed with band pass filter of 1-'70Hz'$ , sensitivity of 7 uV/mm, display speed of 82m/sec with a '60Hz'$  notched filter applied as appropriate. EEG data were recorded continuously and digitally stored.  Video monitoring was available and reviewed as appropriate. Description: The posterior dominant rhythm consists of 9-10 Hz activity of moderate voltage (25-35 uV) seen predominantly in posterior head regions, symmetric and reactive to eye opening and eye closing. Sleep was characterized by vertex waves, sleep spindles (12  to 14 Hz), maximal frontocentral region.  Physiologic photic driving was not seen during photic stimulation.  Hyperventilation was not performed.   IMPRESSION: This study is within normal limits. No seizures or epileptiform discharges were seen throughout the recording. A normal interictal EEG does not exclude the diagnosis of epilepsy. Priyanka Barbra Sarks   US Abdomen  Limited RUQ (LIVER/GB)  Result Date: 11/11/2022 CLINICAL DATA:  Elevated liver function tests. EXAM: ULTRASOUND ABDOMEN LIMITED RIGHT UPPER QUADRANT COMPARISON:  None Available. FINDINGS: Gallbladder: No gallstones or wall thickening visualized (2.4 mm). No sonographic Murphy sign noted by sonographer. Common bile duct: Diameter: 2.8 mm Liver: No focal lesion identified. Diffusely increased echogenicity of the liver parenchyma is noted. Portal vein is patent on color Doppler imaging with normal direction of blood flow towards the liver. Other: None. IMPRESSION: Hepatic steatosis without focal liver lesions. Electronically Signed   By: Virgina Norfolk M.D.   On: 11/11/2022 02:14   CT HEAD WO CONTRAST (5MM)  Result Date: 11/11/2022 CLINICAL DATA:  Status post seizure. EXAM: CT HEAD WITHOUT CONTRAST TECHNIQUE: Contiguous axial images were obtained from the base of the skull through the vertex without intravenous contrast. RADIATION DOSE REDUCTION: This exam was performed according to the departmental dose-optimization program which includes automated exposure control, adjustment of the mA and/or kV according to patient size and/or use of iterative reconstruction technique. COMPARISON:  November 10, 2022 FINDINGS: Brain: There is mild cerebral atrophy with widening of the extra-axial spaces and ventricular dilatation. There are areas of decreased attenuation within the white matter tracts of the supratentorial brain, consistent with microvascular disease changes. Vascular: There is mild to moderate severity calcification of the bilateral cavernous carotid arteries. Skull: Small, bilateral nondisplaced nasal bone fractures are seen. These are of indeterminate age. Sinuses/Orbits: There is marked severity bilateral maxillary sinus and bilateral ethmoid sinus mucosal thickening. Other: None. IMPRESSION: 1. No acute intracranial abnormality. 2. Generalized cerebral atrophy with chronic white matter small vessel ischemic  changes. 3. Marked severity bilateral maxillary sinus and bilateral ethmoid sinus disease. Electronically Signed   By: Virgina Norfolk M.D.   On: 11/11/2022 01:38   DG Knee 1-2 Views Right  Result Date: 11/11/2022 CLINICAL DATA:  Recent fall with knee pain, initial encounter EXAM: RIGHT KNEE - 2 VIEW COMPARISON:  07/09/2021 FINDINGS: No acute fracture or dislocation is noted. Degenerative changes of the knee joint are seen. Mild soft tissue swelling is noted in the suprapatellar region likely related to the recent injury. IMPRESSION: Degenerative change without acute bony abnormality. Electronically Signed   By: Inez Catalina M.D.   On: 11/11/2022 01:21   DG CHEST PORT 1 VIEW  Result Date: 11/11/2022 CLINICAL DATA:  Syncope. EXAM: PORTABLE CHEST 1 VIEW COMPARISON:  None Available. FINDINGS: The cardiac silhouette is enlarged and unchanged in size. There is moderate severity calcification of the aortic arch. Mild, bilateral perihilar atelectatic changes are seen. There is no evidence of a pleural effusion or pneumothorax. The visualized skeletal structures are unremarkable. IMPRESSION: Cardiomegaly with mild bilateral perihilar atelectatic changes. Electronically Signed   By: Virgina Norfolk M.D.   On: 11/11/2022 00:28   DG Lumbar Spine Complete  Result Date: 11/10/2022 CLINICAL DATA:  Fall. EXAM: LUMBAR SPINE - COMPLETE 4+ VIEW COMPARISON:  None Available. FINDINGS: No fracture or traumatic malalignment. Multilevel degenerative disc disease, most prominent in the lower lumbar spine. Lower lumbar facet degenerative changes. No other bony or soft tissue abnormalities identified. IMPRESSION: No fracture or traumatic malalignment. Degenerative changes as above.  Electronically Signed   By: Dorise Bullion III M.D.   On: 11/10/2022 17:51   CT Head Wo Contrast  Result Date: 11/10/2022 CLINICAL DATA:  Seizures, new onset.  Head and facial trauma. EXAM: CT HEAD WITHOUT CONTRAST CT MAXILLOFACIAL WITHOUT  CONTRAST TECHNIQUE: Multidetector CT imaging of the head and maxillofacial structures were performed using the standard protocol without intravenous contrast. Multiplanar CT image reconstructions of the maxillofacial structures were also generated. RADIATION DOSE REDUCTION: This exam was performed according to the departmental dose-optimization program which includes automated exposure control, adjustment of the mA and/or kV according to patient size and/or use of iterative reconstruction technique. COMPARISON:  None Available. FINDINGS: CT HEAD FINDINGS Brain: No evidence of acute infarction, hemorrhage, hydrocephalus, extra-axial collection or mass lesion/mass effect. Mild generalized cerebral atrophy. Patchy area of low-attenuation of the periventricular white matter presumed chronic microvascular ischemic changes. Vascular: No hyperdense vessel or unexpected calcification. Skull: Normal. Negative for fracture or focal lesion. Other: None. CT MAXILLOFACIAL FINDINGS Osseous: No fracture or mandibular dislocation. No destructive process. Orbits: Negative. No traumatic or inflammatory finding. Sinuses: Marked mucosal thickening of the bilateral maxillary sinuses with partial opacification. Mucosal thickening of the ethmoid air cells. Remaining paranasal sinuses and mastoid air cells appear clear. Soft tissues: No significant hematoma or fluid collection. IMPRESSION: CT head: 1. No acute intracranial abnormality. 2. Mild generalized cerebral atrophy and chronic microvascular ischemic changes of the white matter. CT maxillofacial: 1. No acute facial bone fracture. 2. Bilateral maxillary and ethmoid sinus disease. Electronically Signed   By: Keane Police D.O.   On: 11/10/2022 17:33   CT Maxillofacial Wo Contrast  Result Date: 11/10/2022 CLINICAL DATA:  Seizures, new onset.  Head and facial trauma. EXAM: CT HEAD WITHOUT CONTRAST CT MAXILLOFACIAL WITHOUT CONTRAST TECHNIQUE: Multidetector CT imaging of the head and  maxillofacial structures were performed using the standard protocol without intravenous contrast. Multiplanar CT image reconstructions of the maxillofacial structures were also generated. RADIATION DOSE REDUCTION: This exam was performed according to the departmental dose-optimization program which includes automated exposure control, adjustment of the mA and/or kV according to patient size and/or use of iterative reconstruction technique. COMPARISON:  None Available. FINDINGS: CT HEAD FINDINGS Brain: No evidence of acute infarction, hemorrhage, hydrocephalus, extra-axial collection or mass lesion/mass effect. Mild generalized cerebral atrophy. Patchy area of low-attenuation of the periventricular white matter presumed chronic microvascular ischemic changes. Vascular: No hyperdense vessel or unexpected calcification. Skull: Normal. Negative for fracture or focal lesion. Other: None. CT MAXILLOFACIAL FINDINGS Osseous: No fracture or mandibular dislocation. No destructive process. Orbits: Negative. No traumatic or inflammatory finding. Sinuses: Marked mucosal thickening of the bilateral maxillary sinuses with partial opacification. Mucosal thickening of the ethmoid air cells. Remaining paranasal sinuses and mastoid air cells appear clear. Soft tissues: No significant hematoma or fluid collection. IMPRESSION: CT head: 1. No acute intracranial abnormality. 2. Mild generalized cerebral atrophy and chronic microvascular ischemic changes of the white matter. CT maxillofacial: 1. No acute facial bone fracture. 2. Bilateral maxillary and ethmoid sinus disease. Electronically Signed   By: Keane Police D.O.   On: 11/10/2022 17:33      Signature  -   Lala Lund M.D on 11/12/2022 at 10:30 AM   -  To page go to [email or web address]

## 2022-11-12 NOTE — Plan of Care (Signed)
Problem: Education: Goal: Expressions of having a comfortable level of knowledge regarding the disease process will increase 11/12/2022 0617 by Annia Belt, RN Outcome: Progressing 11/12/2022 0613 by Annia Belt, RN Outcome: Progressing   Problem: Coping: Goal: Ability to adjust to condition or change in health will improve 11/12/2022 0617 by Annia Belt, RN Outcome: Progressing 11/12/2022 0613 by Annia Belt, RN Outcome: Progressing Goal: Ability to identify appropriate support needs will improve 11/12/2022 0617 by Annia Belt, RN Outcome: Progressing 11/12/2022 0613 by Annia Belt, RN Outcome: Progressing   Problem: Health Behavior/Discharge Planning: Goal: Compliance with prescribed medication regimen will improve 11/12/2022 0617 by Annia Belt, RN Outcome: Progressing 11/12/2022 0613 by Annia Belt, RN Outcome: Progressing   Problem: Medication: Goal: Risk for medication side effects will decrease 11/12/2022 0617 by Annia Belt, RN Outcome: Progressing 11/12/2022 0613 by Annia Belt, RN Outcome: Progressing   Problem: Clinical Measurements: Goal: Complications related to the disease process, condition or treatment will be avoided or minimized 11/12/2022 0617 by Annia Belt, RN Outcome: Progressing 11/12/2022 0613 by Annia Belt, RN Outcome: Progressing Goal: Diagnostic test results will improve 11/12/2022 0617 by Annia Belt, RN Outcome: Progressing 11/12/2022 0613 by Annia Belt, RN Outcome: Progressing   Problem: Safety: Goal: Verbalization of understanding the information provided will improve 11/12/2022 0617 by Annia Belt, RN Outcome: Progressing 11/12/2022 0613 by Annia Belt, RN Outcome: Progressing   Problem: Self-Concept: Goal: Level of anxiety will decrease 11/12/2022 0617 by Annia Belt, RN Outcome: Progressing 11/12/2022 0613 by Annia Belt, RN Outcome: Progressing Goal: Ability to verbalize feelings about condition will  improve 11/12/2022 0617 by Annia Belt, RN Outcome: Progressing 11/12/2022 0613 by Annia Belt, RN Outcome: Progressing   Problem: Education: Goal: Knowledge of General Education information will improve Description: Including pain rating scale, medication(s)/side effects and non-pharmacologic comfort measures 11/12/2022 0617 by Annia Belt, RN Outcome: Progressing 11/12/2022 0613 by Annia Belt, RN Outcome: Progressing   Problem: Health Behavior/Discharge Planning: Goal: Ability to manage health-related needs will improve 11/12/2022 0617 by Annia Belt, RN Outcome: Progressing 11/12/2022 0613 by Annia Belt, RN Outcome: Progressing   Problem: Clinical Measurements: Goal: Ability to maintain clinical measurements within normal limits will improve 11/12/2022 0617 by Annia Belt, RN Outcome: Progressing 11/12/2022 0613 by Annia Belt, RN Outcome: Progressing Goal: Will remain free from infection 11/12/2022 0617 by Annia Belt, RN Outcome: Progressing 11/12/2022 0613 by Annia Belt, RN Outcome: Progressing Goal: Diagnostic test results will improve 11/12/2022 0617 by Annia Belt, RN Outcome: Progressing 11/12/2022 0613 by Annia Belt, RN Outcome: Progressing Goal: Respiratory complications will improve 11/12/2022 0617 by Annia Belt, RN Outcome: Progressing 11/12/2022 0613 by Annia Belt, RN Outcome: Progressing Goal: Cardiovascular complication will be avoided 11/12/2022 0617 by Annia Belt, RN Outcome: Progressing 11/12/2022 0613 by Annia Belt, RN Outcome: Progressing   Problem: Activity: Goal: Risk for activity intolerance will decrease 11/12/2022 0617 by Annia Belt, RN Outcome: Progressing 11/12/2022 0613 by Annia Belt, RN Outcome: Progressing   Problem: Nutrition: Goal: Adequate nutrition will be maintained 11/12/2022 0617 by Annia Belt, RN Outcome: Progressing 11/12/2022 0613 by Annia Belt, RN Outcome: Progressing   Problem:  Coping: Goal: Level of anxiety will decrease 11/12/2022 0617 by Annia Belt, RN Outcome: Progressing 11/12/2022 0613 by Annia Belt, RN Outcome: Progressing   Problem: Elimination: Goal: Will not experience complications related to bowel motility 11/12/2022 0617 by Annia Belt, RN Outcome: Progressing 11/12/2022 0613 by Annia Belt, RN Outcome: Progressing Goal: Will not experience complications related to urinary retention 11/12/2022 0617 by Annia Belt, RN Outcome:  Progressing 11/12/2022 0613 by Annia Belt, RN Outcome: Progressing   Problem: Pain Managment: Goal: General experience of comfort will improve 11/12/2022 0617 by Annia Belt, RN Outcome: Progressing 11/12/2022 0613 by Annia Belt, RN Outcome: Progressing   Problem: Safety: Goal: Ability to remain free from injury will improve 11/12/2022 0617 by Annia Belt, RN Outcome: Progressing 11/12/2022 0613 by Annia Belt, RN Outcome: Progressing   Problem: Skin Integrity: Goal: Risk for impaired skin integrity will decrease 11/12/2022 0617 by Annia Belt, RN Outcome: Progressing 11/12/2022 0613 by Annia Belt, RN Outcome: Progressing

## 2022-11-12 NOTE — Evaluation (Signed)
Occupational Therapy Evaluation Patient Details Name: Jose [name]. MRN: [id number] DOB: [birth year] Today's Date: 11/12/2022   History of Present Illness Patient is a 52 yo male presenting to the ED with seizures, and hypotensive on 11/10/21.Found to have Afib with heparin administered on 11/11/21. PMH: alcohol abuse, alcohol-related seizure, hypertension, CHF, hyperlipidemia, tobacco use   Clinical Impression   PTA, pt lived with mother and nephew and was independent and ADL and IADL; did not drive. Upon eval, pt presents with decreased activity tolerance, mild balance deficits and slightly decreased strength. Pt performing ADL at supervision level for safety. Able to perform delayed memory recall, basic problem solving, financial management scenario, and follow multistep commands. Will follow acutely to optimize strength and activity tolerance. Recommending discharge home with no OT follow up at this time.      Recommendations for follow up therapy are one component of a multi-disciplinary discharge planning process, led by the attending physician.  Recommendations may be updated based on patient status, additional functional criteria and insurance authorization.   Follow Up Recommendations  No OT follow up     Assistance Recommended at Discharge Intermittent Supervision/Assistance  Patient can return home with the following A little help with walking and/or transfers;A little help with bathing/dressing/bathroom;Assistance with cooking/housework;Assist for transportation;Help with stairs or ramp for entrance    Functional Status Assessment  Patient has had a recent decline in their functional status and demonstrates the ability to make significant improvements in function in a reasonable and predictable amount of time.  Equipment Recommendations  None recommended by OT    Recommendations for Other Services PT consult     Precautions / Restrictions        Mobility Bed Mobility Overal  bed mobility: Independent                  Transfers Overall transfer level: Needs assistance Equipment used: None Transfers: Sit to/from Stand Sit to Stand: Supervision           General transfer comment: initially supervision for safety      Balance Overall balance assessment: Mild deficits observed, not formally tested                                         ADL either performed or assessed with clinical judgement   ADL Overall ADL's : Needs assistance/impaired Eating/Feeding: Independent   Grooming: Modified independent;Standing   Upper Body Bathing: Supervision/ safety;Standing   Lower Body Bathing: Supervison/ safety;Sit to/from stand   Upper Body Dressing : Modified independent;Sitting   Lower Body Dressing: Supervision/safety;Sit to/from stand Lower Body Dressing Details (indicate cue type and reason): donned socks Toilet Transfer: Supervision/safety;Ambulation Toilet Transfer Details (indicate cue type and reason): distant supervision for safety Toileting- Clothing Manipulation and Hygiene: Modified independent;Sitting/lateral lean   Tub/ Shower Transfer: Walk-in shower;Supervision/safety;Ambulation Tub/Shower Transfer Details (indicate cue type and reason): supervision for safety Functional mobility during ADLs: Supervision/safety General ADL Comments: supoervision for short distance mobility. Removing nasal cannula and pt mainatining SpO2 >94 when up moving in room. Went to the restroom and walked from bed to door 2x as well as washed hands and retriecved item from floor     Vision Baseline Vision/History: 1 Wears glasses Ability to See in Adequate Light: 0 Adequate Patient Visual Report: No change from baseline Vision Assessment?: No apparent visual deficits Additional Comments: Could read board without glasses and  locating all needed grooming items [facility]     Perception     Praxis      Pertinent Vitals/Pain Pain  Assessment Pain Assessment: No/denies pain     Hand Dominance Right   Extremity/Trunk Assessment Upper Extremity Assessment Upper Extremity Assessment: Generalized weakness (4+/5 pull, grip, etc with exception of push/shoulder flexion 4/5)   Lower Extremity Assessment Lower Extremity Assessment: Defer to PT evaluation   Cervical / Trunk Assessment Cervical / Trunk Assessment: Normal   Communication Communication Communication: No difficulties   Cognition Arousal/Alertness: Awake/alert Behavior During Therapy: WFL for tasks assessed/performed Overall Cognitive Status: Within Functional Limits for tasks assessed                                 General Comments: Pt perrforming delayed memory recall, simple money management, stating months of year in reverse order, counting backward, etc.     General Comments  VSS on 3L nasal cannula; also VSS after removing nasal cannula during functional mobility    Exercises     Shoulder Instructions      Home Living Family/patient expects to be discharged to:: Private residence Living Arrangements: Parent;Other relatives (nephew) Available Help at Discharge: Family;Available PRN/intermittently Type of Home: House Home Access: Stairs to enter [employer] of Steps: 3 Entrance Stairs-Rails: Right;Left Home Layout: Two level;Able to live on main level with bedroom/bathroom Alternate Level Stairs-Number of Steps: He has an apartment in the basement with full bath. 13 steps down to basement or can reach from outside. . Alternate Level Stairs-Rails: Right;Left;Can reach both Bathroom Shower/Tub: Walk-in shower         Home Equipment: Conservation officer, nature (2 wheels);Cane - single point;Shower seat - built in          Prior Functioning/Environment Prior Level of Function : Independent/Modified Independent             Mobility Comments: Reports no AD ADLs Comments: Reports indpednent but does not drive         OT Problem List: Decreased strength;Decreased activity tolerance;Impaired balance (sitting and/or standing)      OT Treatment/Interventions: Self-care/ADL training;Therapeutic exercise;DME and/or AE instruction;Patient/family education;Balance training    OT Goals(Current goals can be found in the care plan section) Acute Rehab OT Goals Patient Stated Goal: go home OT Goal Formulation: With patient Time For Goal Achievement: 11/26/22 Potential to Achieve Goals: Good  OT Frequency: Min 2X/week    Co-evaluation              AM-PAC OT "6 Clicks" Daily Activity     Outcome Measure Help from another person eating meals?: None Help from another person taking care of personal grooming?: A Little Help from another person toileting, which includes using toliet, bedpan, or urinal?: A Little Help from another person bathing (including washing, rinsing, drying)?: A Little Help from another person to put on and taking off regular upper body clothing?: None Help from another person to put on and taking off regular lower body clothing?: A Little 6 Click Score: 20   End of Session Equipment Utilized During Treatment: Gait belt Nurse Communication: Mobility status  Activity Tolerance: Patient tolerated treatment well Patient left: in bed;with call bell/phone within reach;with bed alarm set  OT Visit Diagnosis: Unsteadiness on feet (R26.81);Muscle weakness (generalized) (M62.81)                Time: 5852-7782 OT Time Calculation (min): 26 min Charges:  OT General Charges $OT Visit: 1 Visit OT Evaluation $OT Eval Low Complexity: 1 Low OT Treatments $Self Care/Home Management : 8-22 mins  Elder Cyphers, OTR/L [facility] Acute Rehabilitation Office: [phone]   Magnus Ivan 11/12/2022, 8:37 AM

## 2022-11-12 NOTE — Progress Notes (Signed)
Progress Note  Patient Name: Jose [name]. Date of Encounter: 11/12/2022  Primary Cardiologist: None  Subjective   No acute events overnight.  Heart rate remains elevated on telemetry.  No symptoms of SOB/chest pain/palpitations.  Patient is wanting to go home.  Inpatient Medications    Scheduled Meds:  carbamazepine  200 mg Oral BID   chlordiazePOXIDE  15 mg Oral TID   diclofenac Sodium  2 g Topical QID   diltiazem  120 mg Oral Q8H   fluticasone furoate-vilanterol  1 puff Inhalation Daily   And   umeclidinium bromide  1 puff Inhalation Daily   folic acid  1 mg Oral Daily   levETIRAcetam  500 mg Oral BID   multivitamin with minerals  1 tablet Oral Daily   nicotine  21 mg Transdermal Daily   pantoprazole  40 mg Oral Daily   [START ON 11/19/2022] thiamine (VITAMIN B1) injection  100 mg Intravenous Daily   Continuous Infusions:  levETIRAcetam Stopped (11/11/22 2240)   thiamine (VITAMIN B1) injection 500 mg (11/12/22 0526)   Followed by   Derrill Memo ON 11/13/2022] thiamine (VITAMIN B1) injection     PRN Meds: acetaminophen **OR** acetaminophen, albuterol, HYDROcodone-acetaminophen, LORazepam **OR** LORazepam, LORazepam   Vital Signs    Vitals:   11/11/22 2155 11/11/22 2307 11/12/22 0349 11/12/22 0758  BP: (!) 155/88 (!) 125/92 122/88 114/78  Pulse: (!) 106 87 (!) 107 86  Resp: '15 18 18 19  '$ Temp: 97.8 F (36.6 C) 98.9 F (37.2 C) 98.9 F (37.2 C) 98.7 F (37.1 C)  TempSrc: Oral Oral Oral Oral  SpO2:  98% 98% 97%  Weight: 91.2 kg     Height: '5\' 10"'$  (1.778 m)       Intake/Output Summary (Last 24 hours) at 11/12/2022 1004 Last data filed at 11/12/2022 0706 Gross per 24 hour  Intake 2435.76 ml  Output 700 ml  Net 1735.76 ml   Filed Weights   11/10/22 2200 11/11/22 2155  Weight: 95 kg 91.2 kg    Telemetry     Personally reviewed, HR 110-140s  ECG    AFib  Physical Exam   GEN: No acute distress.   Neck: No JVD. Cardiac: Irregular rate and rhythm, no  murmur, rub, or gallop.  Respiratory: Nonlabored. Clear to auscultation bilaterally. GI: Soft, nontender, bowel sounds present. MS: No edema; No deformity. Neuro:  Nonfocal. Psych: Alert and oriented x 3. Normal affect.  Labs    Chemistry Recent Labs  Lab 11/10/22 1749 11/11/22 0114 11/11/22 0630 11/12/22 0545  NA 137 138 136 134*  K 4.2 3.6 3.8 3.7  CL 101  --  103 101  CO2 22  --  24 22  GLUCOSE 107*  --  81 120*  BUN 26*  --  18 7  CREATININE 0.73  --  0.69 0.58*  CALCIUM 8.2*  --  7.6* 7.9*  PROT 5.8*  --   --   --   ALBUMIN 3.1*  --   --   --   AST 93*  --   --   --   ALT 48*  --   --   --   ALKPHOS 103  --   --   --   BILITOT 1.0  --   --   --   GFRNONAA >60  --  >60 >60  ANIONGAP 14  --  9 11     Hematology Recent Labs  Lab 11/11/22 0352 11/11/22 0630 11/12/22 0545  WBC 4.5 4.2 4.9  RBC 3.66* 3.60* 3.52*  HGB 11.7* 11.6* 11.1*  HCT 34.1* 33.6* 33.3*  MCV 93.2 93.3 94.6  MCH 32.0 32.2 31.5  MCHC 34.3 34.5 33.3  RDW 15.4 15.5 15.4  PLT 52* 49* 49*    Cardiac Enzymes Recent Labs  Lab 11/11/22 0004 11/11/22 0352  TROPONINIHS 8 8    BNP Recent Labs  Lab 11/11/22 0630 11/12/22 0545  BNP 524.6* 838.6*     DDimerNo results for input(s): "DDIMER" in the last 168 hours.   Radiology    ECHOCARDIOGRAM COMPLETE  Result Date: 11/11/2022    ECHOCARDIOGRAM REPORT   Patient Name:   Jose [name]. Date of Exam: 11/11/2022 Medical Rec #:  [id number]      Height:       70.5 in Accession #:    [id number]     Weight:       209.4 lb Date of Birth:  1971/04/24       BSA:          2.140 m Patient Age:    52 years       BP:           126/99 mmHg Patient Gender: M              HR:           115 bpm. Exam Location:  Inpatient Procedure: 2D Echo, Cardiac Doppler and Color Doppler Indications:    I48.91* Unspeicified atrial fibrillation  History:        Patient has prior history of Echocardiogram examinations, most                 recent 11/05/2021. CHF; Risk  Factors:Hypertension, Dyslipidemia                 and ETOH.  Sonographer:    Bernadene Person RDCS Referring Phys: Toy Baker IMPRESSIONS  1. LVEF challenging with afib/ PVC. Left ventricular ejection fraction, by estimation, is 50 to 55%. The left ventricle has low normal function. The left ventricle has no regional wall motion abnormalities. There is mild left ventricular hypertrophy. Left ventricular diastolic parameters are indeterminate.  2. Right ventricular systolic function is normal. The right ventricular size is normal. There is mildly elevated pulmonary artery systolic pressure.  3. Left atrial size was moderately dilated.  4. Right atrial size was moderately dilated.  5. Trivial mitral valve regurgitation.  6. The aortic valve is tricuspid. Aortic valve regurgitation is not visualized.  7. There is mild dilatation of the ascending aorta, measuring 39 mm.  8. The inferior vena cava is normal in size with greater than 50% respiratory variability, suggesting right atrial pressure of 3 mmHg. FINDINGS  Left Ventricle: LVEF challenging with afib/ PVC. Left ventricular ejection fraction, by estimation, is 50 to 55%. The left ventricle has low normal function. The left ventricle has no regional wall motion abnormalities. The left ventricular internal cavity size was normal in size. There is mild left ventricular hypertrophy. Left ventricular diastolic parameters are indeterminate. Right Ventricle: The right ventricular size is normal. Right ventricular systolic function is normal. There is mildly elevated pulmonary artery systolic pressure. The tricuspid regurgitant velocity is 2.87 m/s, and with an assumed right atrial pressure of 8 mmHg, the estimated right ventricular systolic pressure is 76.1 mmHg. Left Atrium: Left atrial size was moderately dilated. Right Atrium: Right atrial size was moderately dilated. Pericardium: There is no evidence of pericardial effusion. Mitral Valve: Trivial mitral  valve  regurgitation. Tricuspid Valve: Tricuspid valve regurgitation is mild. Aortic Valve: The aortic valve is tricuspid. Aortic valve regurgitation is not visualized. Aortic regurgitation PHT measures 388 msec. Pulmonic Valve: Pulmonic valve regurgitation is not visualized. Aorta: There is mild dilatation of the ascending aorta, measuring 39 mm. Venous: The inferior vena cava is normal in size with greater than 50% respiratory variability, suggesting right atrial pressure of 3 mmHg. IAS/Shunts: The interatrial septum was not well visualized.  LEFT VENTRICLE PLAX 2D LVIDd:         5.00 cm LVIDs:         3.50 cm LV PW:         1.10 cm LV IVS:        1.00 cm LVOT diam:     2.50 cm LV SV:         53 LV SV Index:   25 LVOT Area:     4.91 cm  LV Volumes (MOD) LV vol d, MOD A2C: 135.0 ml LV vol d, MOD A4C: 136.0 ml LV vol s, MOD A2C: 79.7 ml LV vol s, MOD A4C: 69.7 ml LV SV MOD A2C:     55.3 ml LV SV MOD A4C:     136.0 ml LV SV MOD BP:      62.5 ml RIGHT VENTRICLE TAPSE (M-mode): 1.8 cm LEFT ATRIUM              Index        RIGHT ATRIUM           Index LA diam:        4.20 cm  1.96 cm/m   RA Area:     26.40 cm LA Vol (A2C):   93.7 ml  43.79 ml/m  RA Volume:   88.80 ml  41.50 ml/m LA Vol (A4C):   101.0 ml 47.20 ml/m LA Biplane Vol: 98.5 ml  46.03 ml/m  AORTIC VALVE LVOT Vmax:   67.42 cm/s LVOT Vmean:  45.950 cm/s LVOT VTI:    0.108 m AI PHT:      388 msec  AORTA Ao Root diam: 4.40 cm Ao Asc diam:  3.90 cm TRICUSPID VALVE TR Peak grad:   32.9 mmHg TR Vmax:        287.00 cm/s  SHUNTS Systemic VTI:  0.11 m Systemic Diam: 2.50 cm Phineas Inches Electronically signed by Phineas Inches Signature Date/Time: 11/11/2022/11:18:12 AM    Final    EEG adult  Result Date: 11/11/2022 Lora Havens, MD     11/11/2022  8:36 AM Patient Name: Jose [name]. MRN: [id number] Epilepsy Attending: Lora Havens Referring Physician/Provider: Toy Baker, MD Date: 11/11/2022 Duration: 22.04 mins Patient history: 52yo M with h/o epilepsy came  with seizure. EEG to evaluate for seizure. Level of alertness: Awake, asleep AEDs during EEG study: CBZ, LEV Technical aspects: This EEG study was done with scalp electrodes positioned according to the 10-20 International system of electrode placement. Electrical activity was reviewed with band pass filter of 1-'70Hz'$ , sensitivity of 7 uV/mm, display speed of 17m/sec with a '60Hz'$  notched filter applied as appropriate. EEG data were recorded continuously and digitally stored.  Video monitoring was available and reviewed as appropriate. Description: The posterior dominant rhythm consists of 9-10 Hz activity of moderate voltage (25-35 uV) seen predominantly in posterior head regions, symmetric and reactive to eye opening and eye closing. Sleep was characterized by vertex waves, sleep spindles (12 to 14 Hz), maximal frontocentral region.  Physiologic photic driving  was not seen during photic stimulation.  Hyperventilation was not performed.   IMPRESSION: This study is within normal limits. No seizures or epileptiform discharges were seen throughout the recording. A normal interictal EEG does not exclude the diagnosis of epilepsy. Priyanka Barbra Sarks   US Abdomen Limited RUQ (LIVER/GB)  Result Date: 11/11/2022 CLINICAL DATA:  Elevated liver function tests. EXAM: ULTRASOUND ABDOMEN LIMITED RIGHT UPPER QUADRANT COMPARISON:  None Available. FINDINGS: Gallbladder: No gallstones or wall thickening visualized (2.4 mm). No sonographic Murphy sign noted by sonographer. Common bile duct: Diameter: 2.8 mm Liver: No focal lesion identified. Diffusely increased echogenicity of the liver parenchyma is noted. Portal vein is patent on color Doppler imaging with normal direction of blood flow towards the liver. Other: None. IMPRESSION: Hepatic steatosis without focal liver lesions. Electronically Signed   By: Virgina Norfolk M.D.   On: 11/11/2022 02:14   CT HEAD WO CONTRAST (5MM)  Result Date: 11/11/2022 CLINICAL DATA:  Status post  seizure. EXAM: CT HEAD WITHOUT CONTRAST TECHNIQUE: Contiguous axial images were obtained from the base of the skull through the vertex without intravenous contrast. RADIATION DOSE REDUCTION: This exam was performed according to the departmental dose-optimization program which includes automated exposure control, adjustment of the mA and/or kV according to patient size and/or use of iterative reconstruction technique. COMPARISON:  November 10, 2022 FINDINGS: Brain: There is mild cerebral atrophy with widening of the extra-axial spaces and ventricular dilatation. There are areas of decreased attenuation within the white matter tracts of the supratentorial brain, consistent with microvascular disease changes. Vascular: There is mild to moderate severity calcification of the bilateral cavernous carotid arteries. Skull: Small, bilateral nondisplaced nasal bone fractures are seen. These are of indeterminate age. Sinuses/Orbits: There is marked severity bilateral maxillary sinus and bilateral ethmoid sinus mucosal thickening. Other: None. IMPRESSION: 1. No acute intracranial abnormality. 2. Generalized cerebral atrophy with chronic white matter small vessel ischemic changes. 3. Marked severity bilateral maxillary sinus and bilateral ethmoid sinus disease. Electronically Signed   By: Virgina Norfolk M.D.   On: 11/11/2022 01:38   DG Knee 1-2 Views Right  Result Date: 11/11/2022 CLINICAL DATA:  Recent fall with knee pain, initial encounter EXAM: RIGHT KNEE - 2 VIEW COMPARISON:  07/09/2021 FINDINGS: No acute fracture or dislocation is noted. Degenerative changes of the knee joint are seen. Mild soft tissue swelling is noted in the suprapatellar region likely related to the recent injury. IMPRESSION: Degenerative change without acute bony abnormality. Electronically Signed   By: Inez Catalina M.D.   On: 11/11/2022 01:21   DG CHEST PORT 1 VIEW  Result Date: 11/11/2022 CLINICAL DATA:  Syncope. EXAM: PORTABLE CHEST 1 VIEW  COMPARISON:  None Available. FINDINGS: The cardiac silhouette is enlarged and unchanged in size. There is moderate severity calcification of the aortic arch. Mild, bilateral perihilar atelectatic changes are seen. There is no evidence of a pleural effusion or pneumothorax. The visualized skeletal structures are unremarkable. IMPRESSION: Cardiomegaly with mild bilateral perihilar atelectatic changes. Electronically Signed   By: Virgina Norfolk M.D.   On: 11/11/2022 00:28   DG Lumbar Spine Complete  Result Date: 11/10/2022 CLINICAL DATA:  Fall. EXAM: LUMBAR SPINE - COMPLETE 4+ VIEW COMPARISON:  None Available. FINDINGS: No fracture or traumatic malalignment. Multilevel degenerative disc disease, most prominent in the lower lumbar spine. Lower lumbar facet degenerative changes. No other bony or soft tissue abnormalities identified. IMPRESSION: No fracture or traumatic malalignment. Degenerative changes as above. Electronically Signed   By: Dorise Bullion III M.D.  On: 11/10/2022 17:51   CT Head Wo Contrast  Result Date: 11/10/2022 CLINICAL DATA:  Seizures, new onset.  Head and facial trauma. EXAM: CT HEAD WITHOUT CONTRAST CT MAXILLOFACIAL WITHOUT CONTRAST TECHNIQUE: Multidetector CT imaging of the head and maxillofacial structures were performed using the standard protocol without intravenous contrast. Multiplanar CT image reconstructions of the maxillofacial structures were also generated. RADIATION DOSE REDUCTION: This exam was performed according to the departmental dose-optimization program which includes automated exposure control, adjustment of the mA and/or kV according to patient size and/or use of iterative reconstruction technique. COMPARISON:  None Available. FINDINGS: CT HEAD FINDINGS Brain: No evidence of acute infarction, hemorrhage, hydrocephalus, extra-axial collection or mass lesion/mass effect. Mild generalized cerebral atrophy. Patchy area of low-attenuation of the periventricular white  matter presumed chronic microvascular ischemic changes. Vascular: No hyperdense vessel or unexpected calcification. Skull: Normal. Negative for fracture or focal lesion. Other: None. CT MAXILLOFACIAL FINDINGS Osseous: No fracture or mandibular dislocation. No destructive process. Orbits: Negative. No traumatic or inflammatory finding. Sinuses: Marked mucosal thickening of the bilateral maxillary sinuses with partial opacification. Mucosal thickening of the ethmoid air cells. Remaining paranasal sinuses and mastoid air cells appear clear. Soft tissues: No significant hematoma or fluid collection. IMPRESSION: CT head: 1. No acute intracranial abnormality. 2. Mild generalized cerebral atrophy and chronic microvascular ischemic changes of the white matter. CT maxillofacial: 1. No acute facial bone fracture. 2. Bilateral maxillary and ethmoid sinus disease. Electronically Signed   By: Keane Police D.O.   On: 11/10/2022 17:33   CT Maxillofacial Wo Contrast  Result Date: 11/10/2022 CLINICAL DATA:  Seizures, new onset.  Head and facial trauma. EXAM: CT HEAD WITHOUT CONTRAST CT MAXILLOFACIAL WITHOUT CONTRAST TECHNIQUE: Multidetector CT imaging of the head and maxillofacial structures were performed using the standard protocol without intravenous contrast. Multiplanar CT image reconstructions of the maxillofacial structures were also generated. RADIATION DOSE REDUCTION: This exam was performed according to the departmental dose-optimization program which includes automated exposure control, adjustment of the mA and/or kV according to patient size and/or use of iterative reconstruction technique. COMPARISON:  None Available. FINDINGS: CT HEAD FINDINGS Brain: No evidence of acute infarction, hemorrhage, hydrocephalus, extra-axial collection or mass lesion/mass effect. Mild generalized cerebral atrophy. Patchy area of low-attenuation of the periventricular white matter presumed chronic microvascular ischemic changes.  Vascular: No hyperdense vessel or unexpected calcification. Skull: Normal. Negative for fracture or focal lesion. Other: None. CT MAXILLOFACIAL FINDINGS Osseous: No fracture or mandibular dislocation. No destructive process. Orbits: Negative. No traumatic or inflammatory finding. Sinuses: Marked mucosal thickening of the bilateral maxillary sinuses with partial opacification. Mucosal thickening of the ethmoid air cells. Remaining paranasal sinuses and mastoid air cells appear clear. Soft tissues: No significant hematoma or fluid collection. IMPRESSION: CT head: 1. No acute intracranial abnormality. 2. Mild generalized cerebral atrophy and chronic microvascular ischemic changes of the white matter. CT maxillofacial: 1. No acute facial bone fracture. 2. Bilateral maxillary and ethmoid sinus disease. Electronically Signed   By: Keane Police D.O.   On: 11/10/2022 17:33    Patient profile   Patient is a 52 year old M known to have HTN, HLD, nicotine abuse, COPD, alcohol abuse, history of seizure and chronic diastolic HFpEF is currently admitted to hospitalist team for the management of RSV infection and new occurrence of A-fib with RVR.  Assessment & Plan    # New onset of A-fib with RVR in the setting of RSV infection # Alcohol abuse and alcohol withdrawal seizure -Telemetry reviewed, HR 110-140s.  He  was on diltiazem 90 mg every 8 hours that was switched to 120 mg every 8 hours this morning. He also received additional doses of IV Cardizem 10 mg this morning. Recommend starting Cardizem drip for better control of heart rates and transition to p.o. Cardizem after desired HR goal is attained.  Treat underlying etiology, RSV infection. -Not a candidate for [facility] due to noncompliance, alcohol abuse and chronic thrombocytopenia (platelets 49 K). CHA2DS2-VASc 2 score is 2 (HTN, diastolic HF).  # Chronic diastolic heart failure, BNP 524, compensated # RSV infection: Management per primary team.  I have spent a  total of 33 minutes with patient reviewing chart , telemetry, EKGs, labs and examining patient as well as establishing an assessment and plan that was discussed with the patient.  > 50% of time was spent in direct patient care.     Signed, Chalmers Guest, MD  11/12/2022, 10:04 AM

## 2022-11-12 NOTE — Progress Notes (Signed)
SATURATION QUALIFICATIONS: (This note is used to comply with regulatory documentation for home oxygen)  Patient Saturations on Room Air at Rest = 95%  Patient Saturations on Room Air while Ambulating = 92%  Patient did not require oxygen to maintain sats >87%   Arby Barrette, [place]  Office [phone]

## 2022-11-12 NOTE — Progress Notes (Signed)
[id number]: Cardizem 10 mg IVP over 10 min given. Pt tolerated without issues. HR before 100-120's A Fib/Flutter and after 107-110 A Fib/Flutter

## 2022-11-13 DIAGNOSIS — I5032 Chronic diastolic (congestive) heart failure: Secondary | ICD-10-CM | POA: Diagnosis not present

## 2022-11-13 DIAGNOSIS — J210 Acute bronchiolitis due to respiratory syncytial virus: Secondary | ICD-10-CM

## 2022-11-13 DIAGNOSIS — I4891 Unspecified atrial fibrillation: Secondary | ICD-10-CM | POA: Diagnosis not present

## 2022-11-13 DIAGNOSIS — G40909 Epilepsy, unspecified, not intractable, without status epilepticus: Secondary | ICD-10-CM | POA: Diagnosis not present

## 2022-11-13 LAB — CBC WITH DIFFERENTIAL/PLATELET
Abs Immature Granulocytes: 0.02 10*3/uL (ref 0.00–0.07)
Basophils Absolute: 0 10*3/uL (ref 0.0–0.1)
Basophils Relative: 1 %
Eosinophils Absolute: 0.1 10*3/uL (ref 0.0–0.5)
Eosinophils Relative: 1 %
HCT: 32.8 % — ABNORMAL LOW (ref 39.0–52.0)
Hemoglobin: 11.5 g/dL — ABNORMAL LOW (ref 13.0–17.0)
Immature Granulocytes: 0 %
Lymphocytes Relative: 22 %
Lymphs Abs: 1.1 10*3/uL (ref 0.7–4.0)
MCH: 32.5 pg (ref 26.0–34.0)
MCHC: 35.1 g/dL (ref 30.0–36.0)
MCV: 92.7 fL (ref 80.0–100.0)
Monocytes Absolute: 0.9 10*3/uL (ref 0.1–1.0)
Monocytes Relative: 16 %
Neutro Abs: 3.1 10*3/uL (ref 1.7–7.7)
Neutrophils Relative %: 60 %
Platelets: 48 10*3/uL — ABNORMAL LOW (ref 150–400)
RBC: 3.54 MIL/uL — ABNORMAL LOW (ref 4.22–5.81)
RDW: 15.7 % — ABNORMAL HIGH (ref 11.5–15.5)
WBC: 5.2 10*3/uL (ref 4.0–10.5)
nRBC: 0 % (ref 0.0–0.2)

## 2022-11-13 LAB — BASIC METABOLIC PANEL
Anion gap: 10 (ref 5–15)
BUN: 7 mg/dL (ref 6–20)
CO2: 24 mmol/L (ref 22–32)
Calcium: 8.6 mg/dL — ABNORMAL LOW (ref 8.9–10.3)
Chloride: 102 mmol/L (ref 98–111)
Creatinine, Ser: 0.6 mg/dL — ABNORMAL LOW (ref 0.61–1.24)
GFR, Estimated: >60 mL/min (ref >60)
Glucose, Bld: 101 mg/dL — ABNORMAL HIGH (ref 70–99)
Potassium: 4 mmol/L (ref 3.5–5.1)
Sodium: 136 mmol/L (ref 135–145)

## 2022-11-13 LAB — MAGNESIUM: Magnesium: 1.7 mg/dL (ref 1.7–2.4)

## 2022-11-13 LAB — BRAIN NATRIURETIC PEPTIDE: B Natriuretic Peptide: 883.4 pg/mL — ABNORMAL HIGH (ref 0.0–100.0)

## 2022-11-13 MED ORDER — DILTIAZEM HCL-DEXTROSE 125-5 MG/125ML-% IV SOLN (PREMIX)
5.0000 mg/h | INTRAVENOUS | Status: AC
  Administered 2022-11-13: 10 mg/h via INTRAVENOUS
  Administered 2022-11-13: 5 mg/h via INTRAVENOUS
  Filled 2022-11-13 (×2): qty 125

## 2022-11-13 MED ORDER — HALOPERIDOL LACTATE 5 MG/ML IJ SOLN
2.0000 mg | Freq: Four times a day (QID) | INTRAMUSCULAR | Status: DC | PRN
  Administered 2022-11-14 (×2): 2 mg via INTRAMUSCULAR
  Filled 2022-11-13 (×2): qty 1

## 2022-11-13 MED ORDER — CHLORDIAZEPOXIDE HCL 5 MG PO CAPS
15.0000 mg | ORAL_CAPSULE | Freq: Three times a day (TID) | ORAL | Status: DC
  Administered 2022-11-13 – 2022-11-14 (×5): 15 mg via ORAL
  Filled 2022-11-13 (×5): qty 3

## 2022-11-13 MED ORDER — CHLORDIAZEPOXIDE HCL 5 MG PO CAPS: 10.0000 mg | ORAL_CAPSULE | Freq: Three times a day (TID) | ORAL | Status: DC

## 2022-11-13 MED ORDER — DIGOXIN 0.25 MG/ML IJ SOLN
0.2500 mg | Freq: Once | INTRAMUSCULAR | Status: AC
  Administered 2022-11-13: 0.25 mg via INTRAVENOUS
  Filled 2022-11-13: qty 1

## 2022-11-13 MED ORDER — MAGNESIUM SULFATE 2 GM/50ML IV SOLN
2.0000 g | Freq: Once | INTRAVENOUS | Status: AC
  Administered 2022-11-13: 2 g via INTRAVENOUS
  Filled 2022-11-13: qty 50

## 2022-11-13 MED ORDER — HALOPERIDOL LACTATE 5 MG/ML IJ SOLN
2.0000 mg | Freq: Once | INTRAMUSCULAR | Status: AC
  Administered 2022-11-13: 2 mg via INTRAVENOUS
  Filled 2022-11-13: qty 1

## 2022-11-13 MED ORDER — DILTIAZEM HCL 30 MG PO TABS
120.0000 mg | ORAL_TABLET | Freq: Three times a day (TID) | ORAL | Status: DC
  Administered 2022-11-13 – 2022-11-15 (×6): 120 mg via ORAL
  Filled 2022-11-13 (×6): qty 4

## 2022-11-13 MED ORDER — DILTIAZEM HCL-DEXTROSE 125-5 MG/125ML-% IV SOLN (PREMIX)
15.0000 mg/h | INTRAVENOUS | Status: DC
  Administered 2022-11-13: 15 mg/h via INTRAVENOUS

## 2022-11-13 MED ORDER — DILTIAZEM LOAD VIA INFUSION
10.0000 mg | Freq: Once | INTRAVENOUS | Status: AC
  Administered 2022-11-13: 10 mg via INTRAVENOUS
  Filled 2022-11-13: qty 10

## 2022-11-13 NOTE — Plan of Care (Signed)

## 2022-11-13 NOTE — Progress Notes (Signed)
pt is insistent on walking over to store states he will be back explained to him numerous times he is on a cardiac drip and a cardiac monitor ( he has removed the monitor ) He wants to leave but doesn't want to go AMA his HR has been variable from 130-110s on 15 of cardizem Refusing to answer CIWA questions

## 2022-11-13 NOTE — Progress Notes (Signed)
Progress Note  Patient Name: Jose [name]. Date of Encounter: 11/13/2022  Primary Cardiologist: None  Subjective   No acute events overnight.  HR remains poorly controlled.  Telemetry reviewed, atrial fibrillation with RVR, HR ranging 120-140s  Inpatient Medications    Scheduled Meds:  carbamazepine  200 mg Oral BID   chlordiazePOXIDE  15 mg Oral TID   diclofenac Sodium  2 g Topical QID   diltiazem  120 mg Oral Q8H   feeding supplement  237 mL Oral BID BM   fluticasone furoate-vilanterol  1 puff Inhalation Daily   And   umeclidinium bromide  1 puff Inhalation Daily   folic acid  1 mg Oral Daily   levETIRAcetam  500 mg Oral BID   multivitamin with minerals  1 tablet Oral Daily   nicotine  21 mg Transdermal Daily   pantoprazole  40 mg Oral Daily   [START ON 11/19/2022] thiamine (VITAMIN B1) injection  100 mg Intravenous Daily   Continuous Infusions:  diltiazem (CARDIZEM) infusion     levETIRAcetam Stopped (11/11/22 2240)   thiamine (VITAMIN B1) injection     PRN Meds: acetaminophen **OR** acetaminophen, albuterol, diltiazem, HYDROcodone-acetaminophen, LORazepam **OR** LORazepam, LORazepam   Vital Signs    Vitals:   11/12/22 1938 11/12/22 2000 11/12/22 2359 11/13/22 0349  BP: (!) 135/102 (!) 135/102 129/87 (!) 125/110  Pulse: 90 90 92 (!) 107  Resp: '20  20 20  '$ Temp: 99 F (37.2 C)  99.2 F (37.3 C) 98.9 F (37.2 C)  TempSrc: Oral  Oral Oral  SpO2: 96%  98% 99%  Weight:      Height:        Intake/Output Summary (Last 24 hours) at 11/13/2022 1010 Last data filed at 11/13/2022 0349 Gross per 24 hour  Intake --  Output 700 ml  Net -700 ml   Filed Weights   11/10/22 2200 11/11/22 2155  Weight: 95 kg 91.2 kg    Telemetry     Personally reviewed, HR 120-140s  ECG    AFib  Physical Exam   GEN: No acute distress.   Neck: No JVD. Cardiac: Irregular rate and rhythm, no murmur, rub, or gallop.  Respiratory: Nonlabored. Clear to auscultation  bilaterally. GI: Soft, nontender, bowel sounds present. MS: No edema; No deformity. Neuro:  Nonfocal. Psych: Alert and oriented x 3. Normal affect.  Labs    Chemistry Recent Labs  Lab 11/10/22 1749 11/11/22 0114 11/11/22 0630 11/12/22 0545 11/13/22 0304  NA 137   < > 136 134* 136  K 4.2   < > 3.8 3.7 4.0  CL 101  --  103 101 102  CO2 22  --  '24 22 24  '$ GLUCOSE 107*  --  81 120* 101*  BUN 26*  --  '18 7 7  '$ CREATININE 0.73  --  0.69 0.58* 0.60*  CALCIUM 8.2*  --  7.6* 7.9* 8.6*  PROT 5.8*  --   --   --   --   ALBUMIN 3.1*  --   --   --   --   AST 93*  --   --   --   --   ALT 48*  --   --   --   --   ALKPHOS 103  --   --   --   --   BILITOT 1.0  --   --   --   --   GFRNONAA >60  --  >60 >60 >60  ANIONGAP 14  --  '9 11 10   '$ < > = values in this interval not displayed.     Hematology Recent Labs  Lab 11/11/22 0630 11/12/22 0545 11/13/22 0304  WBC 4.2 4.9 5.2  RBC 3.60* 3.52* 3.54*  HGB 11.6* 11.1* 11.5*  HCT 33.6* 33.3* 32.8*  MCV 93.3 94.6 92.7  MCH 32.2 31.5 32.5  MCHC 34.5 33.3 35.1  RDW 15.5 15.4 15.7*  PLT 49* 49* 48*    Cardiac Enzymes Recent Labs  Lab 11/11/22 0004 11/11/22 0352  TROPONINIHS 8 8    BNP Recent Labs  Lab 11/11/22 0630 11/12/22 0545 11/13/22 0304  BNP 524.6* 838.6* 883.4*     DDimerNo results for input(s): "DDIMER" in the last 168 hours.   Radiology    ECHOCARDIOGRAM COMPLETE  Result Date: 11/11/2022    ECHOCARDIOGRAM REPORT   Patient Name:   Jose [name]. Date of Exam: 11/11/2022 Medical Rec #:  [id number]      Height:       70.5 in Accession #:    [id number]     Weight:       209.4 lb Date of Birth:  06-02-1971       BSA:          2.140 m Patient Age:    52 years       BP:           126/99 mmHg Patient Gender: M              HR:           115 bpm. Exam Location:  Inpatient Procedure: 2D Echo, Cardiac Doppler and Color Doppler Indications:    I48.91* Unspeicified atrial fibrillation  History:        Patient has prior history of  Echocardiogram examinations, most                 recent 11/05/2021. CHF; Risk Factors:Hypertension, Dyslipidemia                 and ETOH.  Sonographer:    Bernadene Person RDCS Referring Phys: Toy Baker IMPRESSIONS  1. LVEF challenging with afib/ PVC. Left ventricular ejection fraction, by estimation, is 50 to 55%. The left ventricle has low normal function. The left ventricle has no regional wall motion abnormalities. There is mild left ventricular hypertrophy. Left ventricular diastolic parameters are indeterminate.  2. Right ventricular systolic function is normal. The right ventricular size is normal. There is mildly elevated pulmonary artery systolic pressure.  3. Left atrial size was moderately dilated.  4. Right atrial size was moderately dilated.  5. Trivial mitral valve regurgitation.  6. The aortic valve is tricuspid. Aortic valve regurgitation is not visualized.  7. There is mild dilatation of the ascending aorta, measuring 39 mm.  8. The inferior vena cava is normal in size with greater than 50% respiratory variability, suggesting right atrial pressure of 3 mmHg. FINDINGS  Left Ventricle: LVEF challenging with afib/ PVC. Left ventricular ejection fraction, by estimation, is 50 to 55%. The left ventricle has low normal function. The left ventricle has no regional wall motion abnormalities. The left ventricular internal cavity size was normal in size. There is mild left ventricular hypertrophy. Left ventricular diastolic parameters are indeterminate. Right Ventricle: The right ventricular size is normal. Right ventricular systolic function is normal. There is mildly elevated pulmonary artery systolic pressure. The tricuspid regurgitant velocity is 2.87 m/s, and with an assumed right atrial pressure of 8  mmHg, the estimated right ventricular systolic pressure is 87.8 mmHg. Left Atrium: Left atrial size was moderately dilated. Right Atrium: Right atrial size was moderately dilated. Pericardium:  There is no evidence of pericardial effusion. Mitral Valve: Trivial mitral valve regurgitation. Tricuspid Valve: Tricuspid valve regurgitation is mild. Aortic Valve: The aortic valve is tricuspid. Aortic valve regurgitation is not visualized. Aortic regurgitation PHT measures 388 msec. Pulmonic Valve: Pulmonic valve regurgitation is not visualized. Aorta: There is mild dilatation of the ascending aorta, measuring 39 mm. Venous: The inferior vena cava is normal in size with greater than 50% respiratory variability, suggesting right atrial pressure of 3 mmHg. IAS/Shunts: The interatrial septum was not well visualized.  LEFT VENTRICLE PLAX 2D LVIDd:         5.00 cm LVIDs:         3.50 cm LV PW:         1.10 cm LV IVS:        1.00 cm LVOT diam:     2.50 cm LV SV:         53 LV SV Index:   25 LVOT Area:     4.91 cm  LV Volumes (MOD) LV vol d, MOD A2C: 135.0 ml LV vol d, MOD A4C: 136.0 ml LV vol s, MOD A2C: 79.7 ml LV vol s, MOD A4C: 69.7 ml LV SV MOD A2C:     55.3 ml LV SV MOD A4C:     136.0 ml LV SV MOD BP:      62.5 ml RIGHT VENTRICLE TAPSE (M-mode): 1.8 cm LEFT ATRIUM              Index        RIGHT ATRIUM           Index LA diam:        4.20 cm  1.96 cm/m   RA Area:     26.40 cm LA Vol (A2C):   93.7 ml  43.79 ml/m  RA Volume:   88.80 ml  41.50 ml/m LA Vol (A4C):   101.0 ml 47.20 ml/m LA Biplane Vol: 98.5 ml  46.03 ml/m  AORTIC VALVE LVOT Vmax:   67.42 cm/s LVOT Vmean:  45.950 cm/s LVOT VTI:    0.108 m AI PHT:      388 msec  AORTA Ao Root diam: 4.40 cm Ao Asc diam:  3.90 cm TRICUSPID VALVE TR Peak grad:   32.9 mmHg TR Vmax:        287.00 cm/s  SHUNTS Systemic VTI:  0.11 m Systemic Diam: 2.50 cm Landscape architect signed by Phineas Inches Signature Date/Time: 11/11/2022/11:18:12 AM    Final     Patient profile   Patient is a 52 year old M known to have HTN, HLD, nicotine abuse, COPD, alcohol abuse, history of seizure and chronic diastolic HFpEF is currently admitted to hospitalist team for the management  of RSV infection and new occurrence of A-fib with RVR.  Assessment & Plan    # New onset of A-fib with RVR in the setting of RSV infection # Alcohol abuse and alcohol withdrawal seizure -Telemetry reviewed, HR 120-140s and in atrial fibrillation.  Currently on diltiazem 120 mg every 8 hours. Strongly recommend to start diltiazem drip for adequate rate control and hold p.o. diltiazem. Patient insisting to go home today despite educating him about the risk of inadequate HR control including cardiomyopathy. Patient continues to be adamant and wants to go home. -Not a candidate for [facility] due to noncompliance, alcohol  abuse and chronic thrombocytopenia (platelet count 48 K).  CHA2DS2-VASc 2 score is 2 (HTN, diastolic HF).  # Chronic diastolic heart failure, BNP 524, compensated # RSV infection: Management per primary team  I have spent a total of 31 minutes with patient reviewing chart , telemetry, EKGs, labs and examining patient as well as establishing an assessment and plan that was discussed with the patient.  > 50% of time was spent in direct patient care.     Signed, Chalmers Guest, MD  11/13/2022, 10:10 AM

## 2022-11-13 NOTE — Progress Notes (Addendum)
PROGRESS NOTE                                                                                                                                                                                                             Patient Demographics:    Jose [name], is a 52 y.o. male, DOB - 1971/04/02, IWP:[id number]  Outpatient Primary MD for the patient is Jon Billings, NP    LOS - 2  Admit date - 11/10/2022    Chief Complaint  Patient presents with   Seizures       Brief Narrative (HPI from H&P)   52 y.o. male with medical history significant of   chronic diastolic CHF, COPD, tobacco alcohol abuse, hypertension, seizures not compliant with his medications.  Patient was not feeling good for the last few days and had cut down his alcohol intake, he subsequently started getting jittery and felt like he was going to have a seizure. He subsequently had a seizure-like episode and was brought to the ER, in the ER he was also found to have new onset A-fib RVR.  He was seen by neurology.  Head CT was nonacute.  He was admitted to the hospital for further care.    Subjective:   Patient in bed, appears comfortable, denies any headache, no fever, no chest pain or pressure, no shortness of breath , no abdominal pain. No focal weakness.  Eager to go home was told to stay till heart rate is better controlled.   Assessment  & Plan :   Breakthrough seizure in a patient with history of seizures, noncompliant with antiepileptic medications and ongoing alcohol abuse. He was noncompliant with his seizure medications and was also abruptly trying to taper off his alcohol intake, likely seizure due to combination of alcohol withdrawal and subtherapeutic levels of his antiseizure medications.  Head CT stable, EEG nonacute, his home medication, for the next 3 days he will get Keppra overlap.  Has been counseled on compliance.  Seen by neurology.  Has been  counseled not to drive for 6 months.  Ongoing alcohol and nicotine abuse.  Currently in DTs and alcohol withdrawal.  Placed on Librium , PRN Haldol along with CIWA protocol.  Counseled to quit.  New onset A-fib RVR.  Mali vas 2 score of 3.  He is an alcoholic, due to his  underlying thrombocytopenia, noncompliance with his medications he is a poor candidate for anticoagulation long-term, remains in RVR despite multiple doses of IV digoxin and oral Cardizem due to DTs, added Cardizem drip on 11/13/2022, if blood pressure becomes a challenge will add amiodarone, stable TSH and echocardiogram.  Hypertension.  Blood pressure currently soft continue on Cardizem and monitor.  Gentle IV fluids.  Acute on chronic thrombocytopenia.  Likely due to underlying alcohol use and abuse.  Ultrasound suggestive of fatty liver.  Outpatient monitoring by PCP.  Acute metabolic encephalopathy.  Likely postictal as well.  Stable now.  May worsen again as he is going into DTs.  Asymptomatic transaminitis with fatty liver on ultrasound.  Due to alcohol abuse.  Counseled to quit.  COPD.  Stable.  Chronic diastolic CHF last EF 16%.  Currently dehydrated being hydrated with IV fluids.  Fall with right knee pain.  X-ray shows chronic changes.  Supportive care.  PT OT.  Hypomagnesemia.  Replaced.        Condition - Fair  Family Communication  : None present  Code Status :  Full  Consults  :  Neuro, Cards  PUD Prophylaxis : PPI   Procedures  :     TTE -   1. LVEF challenging with afib/ PVC. Left ventricular ejection fraction, by estimation, is 50 to 55%. The left ventricle has low normal function. The left ventricle has no regional wall motion abnormalities. There is mild left ventricular hypertrophy. Left ventricular diastolic parameters are indeterminate.  2. Right ventricular systolic function is normal. The right ventricular size is normal. There is mildly elevated pulmonary artery systolic pressure.  3.  Left atrial size was moderately dilated.  4. Right atrial size was moderately dilated.  5. Trivial mitral valve regurgitation.  6. The aortic valve is tricuspid. Aortic valve regurgitation is not visualized.  7. There is mild dilatation of the ascending aorta, measuring 39 mm.  8. The inferior vena cava is normal in size with greater than 50% respiratory variability, suggesting right atrial pressure of 3 mmHg.   EEG - Non acute  Right upper quadrant ultrasound.  Fatty liver.    CT head - non acute      Disposition Plan  :    Status is: Observation  DVT Prophylaxis  : SCDs    Lab Results  Component Value Date   PLT 48 (L) 11/13/2022    Diet :  Diet Order             DIET SOFT Fluid consistency: Thin  Diet effective now                    Inpatient Medications  Scheduled Meds:  carbamazepine  200 mg Oral BID   chlordiazePOXIDE  15 mg Oral TID   diclofenac Sodium  2 g Topical QID   digoxin  0.25 mg Intravenous Once   diltiazem  120 mg Oral Q8H   feeding supplement  237 mL Oral BID BM   fluticasone furoate-vilanterol  1 puff Inhalation Daily   And   umeclidinium bromide  1 puff Inhalation Daily   folic acid  1 mg Oral Daily   levETIRAcetam  500 mg Oral BID   multivitamin with minerals  1 tablet Oral Daily   nicotine  21 mg Transdermal Daily   pantoprazole  40 mg Oral Daily   [START ON 11/19/2022] thiamine (VITAMIN B1) injection  100 mg Intravenous Daily   Continuous Infusions:  diltiazem (CARDIZEM) infusion  levETIRAcetam Stopped (11/11/22 2240)   thiamine (VITAMIN B1) injection     PRN Meds:.acetaminophen **OR** acetaminophen, albuterol, diltiazem, HYDROcodone-acetaminophen, LORazepam **OR** LORazepam, LORazepam  Antibiotics  :    Anti-infectives (From admission, onward)    None         Objective:   Vitals:   11/12/22 1938 11/12/22 2000 11/12/22 2359 11/13/22 0349  BP: (!) 135/102 (!) 135/102 129/87 (!) 125/110  Pulse: 90 90 92 (!) 107   Resp: '20  20 20  '$ Temp: 99 F (37.2 C)  99.2 F (37.3 C) 98.9 F (37.2 C)  TempSrc: Oral  Oral Oral  SpO2: 96%  98% 99%  Weight:      Height:        Wt Readings from Last 3 Encounters:  11/11/22 91.2 kg  10/20/22 95 kg  10/06/22 96.6 kg     Intake/Output Summary (Last 24 hours) at 11/13/2022 0932 Last data filed at 11/13/2022 0349 Gross per 24 hour  Intake --  Output 700 ml  Net -700 ml     Physical Exam  Awake Alert, No new F.N deficits, Normal affect [place].AT,PERRAL Supple Neck, No JVD,   Symmetrical Chest wall movement, Good air movement bilaterally, CTAB iRRR,No Gallops, Rubs or new Murmurs,  +ve B.Sounds, Abd Soft, No tenderness,   No Cyanosis, Clubbing or edema       Data Review:    Recent Labs  Lab 11/10/22 1749 11/11/22 0114 11/11/22 0352 11/11/22 0630 11/12/22 0545 11/13/22 0304  WBC 4.1  --  4.5 4.2 4.9 5.2  HGB 12.6* 12.9* 11.7* 11.6* 11.1* 11.5*  HCT 38.0* 38.0* 34.1* 33.6* 33.3* 32.8*  PLT 53*  --  52* 49* 49* 48*  MCV 95.0  --  93.2 93.3 94.6 92.7  MCH 31.5  --  32.0 32.2 31.5 32.5  MCHC 33.2  --  34.3 34.5 33.3 35.1  RDW 15.4  --  15.4 15.5 15.4 15.7*  LYMPHSABS 1.0  --   --   --  1.1 1.1  MONOABS 0.5  --   --   --  0.7 0.9  EOSABS 0.0  --   --   --  0.1 0.1  BASOSABS 0.0  --   --   --  0.0 0.0    Recent Labs  Lab 11/10/22 1749 11/11/22 0004 11/11/22 0114 11/11/22 0429 11/11/22 0630 11/12/22 0545 11/13/22 0304  NA 137  --  138  --  136 134* 136  K 4.2  --  3.6  --  3.8 3.7 4.0  CL 101  --   --   --  103 101 102  CO2 22  --   --   --  '24 22 24  '$ ANIONGAP 14  --   --   --  '9 11 10  '$ GLUCOSE 107*  --   --   --  81 120* 101*  BUN 26*  --   --   --  '18 7 7  '$ CREATININE 0.73  --   --   --  0.69 0.58* 0.60*  AST 93*  --   --   --   --   --   --   ALT 48*  --   --   --   --   --   --   ALKPHOS 103  --   --   --   --   --   --   BILITOT 1.0  --   --   --   --   --   --  ALBUMIN 3.1*  --   --   --   --   --   --   LATICACIDVEN  --   0.9  --  0.8  --   --   --   INR  --  1.2  --   --   --   --   --   TSH  --  1.436  --   --   --   --   --   AMMONIA  --  39*  --   --   --   --   --   BNP  --   --   --   --  524.6* 838.6* 883.4*  MG  --  1.0*  --   --   --  1.4* 1.7  CALCIUM 8.2*  --   --   --  7.6* 7.9* 8.6*       Radiology Reports ECHOCARDIOGRAM COMPLETE  Result Date: 11/11/2022    ECHOCARDIOGRAM REPORT   Patient Name:   Rimas Gilham. Date of Exam: 11/11/2022 Medical Rec #:  [id number]      Height:       70.5 in Accession #:    [id number]     Weight:       209.4 lb Date of Birth:  03-06-1971       BSA:          2.140 m Patient Age:    37 years       BP:           126/99 mmHg Patient Gender: M              HR:           115 bpm. Exam Location:  Inpatient Procedure: 2D Echo, Cardiac Doppler and Color Doppler Indications:    I48.91* Unspeicified atrial fibrillation  History:        Patient has prior history of Echocardiogram examinations, most                 recent 11/05/2021. CHF; Risk Factors:Hypertension, Dyslipidemia                 and ETOH.  Sonographer:    Bernadene Person RDCS Referring Phys: Toy Baker IMPRESSIONS  1. LVEF challenging with afib/ PVC. Left ventricular ejection fraction, by estimation, is 50 to 55%. The left ventricle has low normal function. The left ventricle has no regional wall motion abnormalities. There is mild left ventricular hypertrophy. Left ventricular diastolic parameters are indeterminate.  2. Right ventricular systolic function is normal. The right ventricular size is normal. There is mildly elevated pulmonary artery systolic pressure.  3. Left atrial size was moderately dilated.  4. Right atrial size was moderately dilated.  5. Trivial mitral valve regurgitation.  6. The aortic valve is tricuspid. Aortic valve regurgitation is not visualized.  7. There is mild dilatation of the ascending aorta, measuring 39 mm.  8. The inferior vena cava is normal in size with greater than 50% respiratory  variability, suggesting right atrial pressure of 3 mmHg. FINDINGS  Left Ventricle: LVEF challenging with afib/ PVC. Left ventricular ejection fraction, by estimation, is 50 to 55%. The left ventricle has low normal function. The left ventricle has no regional wall motion abnormalities. The left ventricular internal cavity size was normal in size. There is mild left ventricular hypertrophy. Left ventricular diastolic parameters are indeterminate. Right Ventricle: The right ventricular size is normal. Right ventricular systolic function is  normal. There is mildly elevated pulmonary artery systolic pressure. The tricuspid regurgitant velocity is 2.87 m/s, and with an assumed right atrial pressure of 8 mmHg, the estimated right ventricular systolic pressure is 63.1 mmHg. Left Atrium: Left atrial size was moderately dilated. Right Atrium: Right atrial size was moderately dilated. Pericardium: There is no evidence of pericardial effusion. Mitral Valve: Trivial mitral valve regurgitation. Tricuspid Valve: Tricuspid valve regurgitation is mild. Aortic Valve: The aortic valve is tricuspid. Aortic valve regurgitation is not visualized. Aortic regurgitation PHT measures 388 msec. Pulmonic Valve: Pulmonic valve regurgitation is not visualized. Aorta: There is mild dilatation of the ascending aorta, measuring 39 mm. Venous: The inferior vena cava is normal in size with greater than 50% respiratory variability, suggesting right atrial pressure of 3 mmHg. IAS/Shunts: The interatrial septum was not well visualized.  LEFT VENTRICLE PLAX 2D LVIDd:         5.00 cm LVIDs:         3.50 cm LV PW:         1.10 cm LV IVS:        1.00 cm LVOT diam:     2.50 cm LV SV:         53 LV SV Index:   25 LVOT Area:     4.91 cm  LV Volumes (MOD) LV vol d, MOD A2C: 135.0 ml LV vol d, MOD A4C: 136.0 ml LV vol s, MOD A2C: 79.7 ml LV vol s, MOD A4C: 69.7 ml LV SV MOD A2C:     55.3 ml LV SV MOD A4C:     136.0 ml LV SV MOD BP:      62.5 ml RIGHT VENTRICLE  TAPSE (M-mode): 1.8 cm LEFT ATRIUM              Index        RIGHT ATRIUM           Index LA diam:        4.20 cm  1.96 cm/m   RA Area:     26.40 cm LA Vol (A2C):   93.7 ml  43.79 ml/m  RA Volume:   88.80 ml  41.50 ml/m LA Vol (A4C):   101.0 ml 47.20 ml/m LA Biplane Vol: 98.5 ml  46.03 ml/m  AORTIC VALVE LVOT Vmax:   67.42 cm/s LVOT Vmean:  45.950 cm/s LVOT VTI:    0.108 m AI PHT:      388 msec  AORTA Ao Root diam: 4.40 cm Ao Asc diam:  3.90 cm TRICUSPID VALVE TR Peak grad:   32.9 mmHg TR Vmax:        287.00 cm/s  SHUNTS Systemic VTI:  0.11 m Systemic Diam: 2.50 cm Phineas Inches Electronically signed by Phineas Inches Signature Date/Time: 11/11/2022/11:18:12 AM    Final    EEG adult  Result Date: 11/11/2022 Lora Havens, MD     11/11/2022  8:36 AM Patient Name: Jose [name]. MRN: [id number] Epilepsy Attending: Lora Havens Referring Physician/Provider: Toy Baker, MD Date: 11/11/2022 Duration: 22.04 mins Patient history: 52yo M with h/o epilepsy came with seizure. EEG to evaluate for seizure. Level of alertness: Awake, asleep AEDs during EEG study: CBZ, LEV Technical aspects: This EEG study was done with scalp electrodes positioned according to the 10-20 International system of electrode placement. Electrical activity was reviewed with band pass filter of 1-'70Hz'$ , sensitivity of 7 uV/mm, display speed of 76m/sec with a '60Hz'$  notched filter applied as appropriate. EEG data were recorded  continuously and digitally stored.  Video monitoring was available and reviewed as appropriate. Description: The posterior dominant rhythm consists of 9-10 Hz activity of moderate voltage (25-35 uV) seen predominantly in posterior head regions, symmetric and reactive to eye opening and eye closing. Sleep was characterized by vertex waves, sleep spindles (12 to 14 Hz), maximal frontocentral region.  Physiologic photic driving was not seen during photic stimulation.  Hyperventilation was not performed.   IMPRESSION: This  study is within normal limits. No seizures or epileptiform discharges were seen throughout the recording. A normal interictal EEG does not exclude the diagnosis of epilepsy. Priyanka Barbra Sarks   US Abdomen Limited RUQ (LIVER/GB)  Result Date: 11/11/2022 CLINICAL DATA:  Elevated liver function tests. EXAM: ULTRASOUND ABDOMEN LIMITED RIGHT UPPER QUADRANT COMPARISON:  None Available. FINDINGS: Gallbladder: No gallstones or wall thickening visualized (2.4 mm). No sonographic Murphy sign noted by sonographer. Common bile duct: Diameter: 2.8 mm Liver: No focal lesion identified. Diffusely increased echogenicity of the liver parenchyma is noted. Portal vein is patent on color Doppler imaging with normal direction of blood flow towards the liver. Other: None. IMPRESSION: Hepatic steatosis without focal liver lesions. Electronically Signed   By: Virgina Norfolk M.D.   On: 11/11/2022 02:14   CT HEAD WO CONTRAST (5MM)  Result Date: 11/11/2022 CLINICAL DATA:  Status post seizure. EXAM: CT HEAD WITHOUT CONTRAST TECHNIQUE: Contiguous axial images were obtained from the base of the skull through the vertex without intravenous contrast. RADIATION DOSE REDUCTION: This exam was performed according to the departmental dose-optimization program which includes automated exposure control, adjustment of the mA and/or kV according to patient size and/or use of iterative reconstruction technique. COMPARISON:  November 10, 2022 FINDINGS: Brain: There is mild cerebral atrophy with widening of the extra-axial spaces and ventricular dilatation. There are areas of decreased attenuation within the white matter tracts of the supratentorial brain, consistent with microvascular disease changes. Vascular: There is mild to moderate severity calcification of the bilateral cavernous carotid arteries. Skull: Small, bilateral nondisplaced nasal bone fractures are seen. These are of indeterminate age. Sinuses/Orbits: There is marked severity bilateral  maxillary sinus and bilateral ethmoid sinus mucosal thickening. Other: None. IMPRESSION: 1. No acute intracranial abnormality. 2. Generalized cerebral atrophy with chronic white matter small vessel ischemic changes. 3. Marked severity bilateral maxillary sinus and bilateral ethmoid sinus disease. Electronically Signed   By: Virgina Norfolk M.D.   On: 11/11/2022 01:38   DG Knee 1-2 Views Right  Result Date: 11/11/2022 CLINICAL DATA:  Recent fall with knee pain, initial encounter EXAM: RIGHT KNEE - 2 VIEW COMPARISON:  07/09/2021 FINDINGS: No acute fracture or dislocation is noted. Degenerative changes of the knee joint are seen. Mild soft tissue swelling is noted in the suprapatellar region likely related to the recent injury. IMPRESSION: Degenerative change without acute bony abnormality. Electronically Signed   By: Jose Catalina M.D.   On: 11/11/2022 01:21   DG CHEST PORT 1 VIEW  Result Date: 11/11/2022 CLINICAL DATA:  Syncope. EXAM: PORTABLE CHEST 1 VIEW COMPARISON:  None Available. FINDINGS: The cardiac silhouette is enlarged and unchanged in size. There is moderate severity calcification of the aortic arch. Mild, bilateral perihilar atelectatic changes are seen. There is no evidence of a pleural effusion or pneumothorax. The visualized skeletal structures are unremarkable. IMPRESSION: Cardiomegaly with mild bilateral perihilar atelectatic changes. Electronically Signed   By: Virgina Norfolk M.D.   On: 11/11/2022 00:28   DG Lumbar Spine Complete  Result Date: 11/10/2022 CLINICAL DATA:  Fall. EXAM: LUMBAR SPINE - COMPLETE 4+ VIEW COMPARISON:  None Available. FINDINGS: No fracture or traumatic malalignment. Multilevel degenerative disc disease, most prominent in the lower lumbar spine. Lower lumbar facet degenerative changes. No other bony or soft tissue abnormalities identified. IMPRESSION: No fracture or traumatic malalignment. Degenerative changes as above. Electronically Signed   By: Dorise Bullion  III M.D.   On: 11/10/2022 17:51   CT Head Wo Contrast  Result Date: 11/10/2022 CLINICAL DATA:  Seizures, new onset.  Head and facial trauma. EXAM: CT HEAD WITHOUT CONTRAST CT MAXILLOFACIAL WITHOUT CONTRAST TECHNIQUE: Multidetector CT imaging of the head and maxillofacial structures were performed using the standard protocol without intravenous contrast. Multiplanar CT image reconstructions of the maxillofacial structures were also generated. RADIATION DOSE REDUCTION: This exam was performed according to the departmental dose-optimization program which includes automated exposure control, adjustment of the mA and/or kV according to patient size and/or use of iterative reconstruction technique. COMPARISON:  None Available. FINDINGS: CT HEAD FINDINGS Brain: No evidence of acute infarction, hemorrhage, hydrocephalus, extra-axial collection or mass lesion/mass effect. Mild generalized cerebral atrophy. Patchy area of low-attenuation of the periventricular white matter presumed chronic microvascular ischemic changes. Vascular: No hyperdense vessel or unexpected calcification. Skull: Normal. Negative for fracture or focal lesion. Other: None. CT MAXILLOFACIAL FINDINGS Osseous: No fracture or mandibular dislocation. No destructive process. Orbits: Negative. No traumatic or inflammatory finding. Sinuses: Marked mucosal thickening of the bilateral maxillary sinuses with partial opacification. Mucosal thickening of the ethmoid air cells. Remaining paranasal sinuses and mastoid air cells appear clear. Soft tissues: No significant hematoma or fluid collection. IMPRESSION: CT head: 1. No acute intracranial abnormality. 2. Mild generalized cerebral atrophy and chronic microvascular ischemic changes of the white matter. CT maxillofacial: 1. No acute facial bone fracture. 2. Bilateral maxillary and ethmoid sinus disease. Electronically Signed   By: Keane Police D.O.   On: 11/10/2022 17:33   CT Maxillofacial Wo  Contrast  Result Date: 11/10/2022 CLINICAL DATA:  Seizures, new onset.  Head and facial trauma. EXAM: CT HEAD WITHOUT CONTRAST CT MAXILLOFACIAL WITHOUT CONTRAST TECHNIQUE: Multidetector CT imaging of the head and maxillofacial structures were performed using the standard protocol without intravenous contrast. Multiplanar CT image reconstructions of the maxillofacial structures were also generated. RADIATION DOSE REDUCTION: This exam was performed according to the departmental dose-optimization program which includes automated exposure control, adjustment of the mA and/or kV according to patient size and/or use of iterative reconstruction technique. COMPARISON:  None Available. FINDINGS: CT HEAD FINDINGS Brain: No evidence of acute infarction, hemorrhage, hydrocephalus, extra-axial collection or mass lesion/mass effect. Mild generalized cerebral atrophy. Patchy area of low-attenuation of the periventricular white matter presumed chronic microvascular ischemic changes. Vascular: No hyperdense vessel or unexpected calcification. Skull: Normal. Negative for fracture or focal lesion. Other: None. CT MAXILLOFACIAL FINDINGS Osseous: No fracture or mandibular dislocation. No destructive process. Orbits: Negative. No traumatic or inflammatory finding. Sinuses: Marked mucosal thickening of the bilateral maxillary sinuses with partial opacification. Mucosal thickening of the ethmoid air cells. Remaining paranasal sinuses and mastoid air cells appear clear. Soft tissues: No significant hematoma or fluid collection. IMPRESSION: CT head: 1. No acute intracranial abnormality. 2. Mild generalized cerebral atrophy and chronic microvascular ischemic changes of the white matter. CT maxillofacial: 1. No acute facial bone fracture. 2. Bilateral maxillary and ethmoid sinus disease. Electronically Signed   By: Keane Police D.O.   On: 11/10/2022 17:33      Signature  -   Lala Lund M.D on 11/13/2022 at 9:32  AM   -  To page go to  [email or web address]

## 2022-11-13 NOTE — Plan of Care (Signed)
  Problem: Education: Goal: Expressions of having a comfortable level of knowledge regarding the disease process will increase Outcome: Progressing   Problem: Coping: Goal: Ability to adjust to condition or change in health will improve Outcome: Progressing Goal: Ability to identify appropriate support needs will improve Outcome: Progressing   Problem: Health Behavior/Discharge Planning: Goal: Compliance with prescribed medication regimen will improve Outcome: Progressing   Problem: Medication: Goal: Risk for medication side effects will decrease Outcome: Progressing   Problem: Clinical Measurements: Goal: Complications related to the disease process, condition or treatment will be avoided or minimized Outcome: Progressing Goal: Diagnostic test results will improve Outcome: Progressing   Problem: Safety: Goal: Verbalization of understanding the information provided will improve Outcome: Progressing   Problem: Self-Concept: Goal: Level of anxiety will decrease Outcome: Progressing Goal: Ability to verbalize feelings about condition will improve Outcome: Progressing   Problem: Education: Goal: Knowledge of General Education information will improve Description: Including pain rating scale, medication(s)/side effects and non-pharmacologic comfort measures Outcome: Progressing   Problem: Health Behavior/Discharge Planning: Goal: Ability to manage health-related needs will improve Outcome: Progressing   Problem: Clinical Measurements: Goal: Ability to maintain clinical measurements within normal limits will improve Outcome: Progressing Goal: Will remain free from infection Outcome: Progressing Goal: Diagnostic test results will improve Outcome: Progressing Goal: Respiratory complications will improve Outcome: Progressing Goal: Cardiovascular complication will be avoided Outcome: Progressing   Problem: Activity: Goal: Risk for activity intolerance will  decrease Outcome: Progressing   Problem: Nutrition: Goal: Adequate nutrition will be maintained Outcome: Progressing   Problem: Coping: Goal: Level of anxiety will decrease Outcome: Progressing   Problem: Elimination: Goal: Will not experience complications related to bowel motility Outcome: Progressing Goal: Will not experience complications related to urinary retention Outcome: Progressing   Problem: Pain Managment: Goal: General experience of comfort will improve Outcome: Progressing   Problem: Safety: Goal: Ability to remain free from injury will improve Outcome: Progressing   Problem: Skin Integrity: Goal: Risk for impaired skin integrity will decrease Outcome: Progressing   Problem: Safety: Goal: Non-violent Restraint(s) Outcome: Progressing

## 2022-11-13 NOTE — Discharge Instructions (Signed)
Do not drive, operate heavy machinery, perform activities at heights, swimming or participation in water activities or provide baby sitting services if your were admitted for syncope or siezures until you have seen by Primary MD or a Neurologist and advised to do so again.   Follow with Primary MD Jose Billings, NP in 7 days   Get CBC, CMP, 2 view Chest X ray -  checked next visit with your primary MD    Activity: As tolerated with Full fall precautions use walker/cane & assistance as needed  Disposition Home    Diet: Heart Healthy    Special Instructions: If you have smoked or chewed Tobacco  in the last 2 yrs please stop smoking, stop any regular Alcohol  and or any Recreational drug use.  On your next visit with your primary care physician please Get Medicines reviewed and adjusted.  Please request your Prim.MD to go over all Hospital Tests and Procedure/Radiological results at the follow up, please get all Hospital records sent to your Prim MD by signing hospital release before you go home.  If you experience worsening of your admission symptoms, develop shortness of breath, life threatening emergency, suicidal or homicidal thoughts you must seek medical attention immediately by calling 911 or calling your MD immediately  if symptoms less severe.  You Must read complete instructions/literature along with all the possible adverse reactions/side effects for all the Medicines you take and that have been prescribed to you. Take any new Medicines after you have completely understood and accpet all the possible adverse reactions/side effects.

## 2022-11-14 DIAGNOSIS — G40909 Epilepsy, unspecified, not intractable, without status epilepticus: Secondary | ICD-10-CM | POA: Diagnosis not present

## 2022-11-14 DIAGNOSIS — I4819 Other persistent atrial fibrillation: Secondary | ICD-10-CM | POA: Diagnosis not present

## 2022-11-14 LAB — BASIC METABOLIC PANEL WITH GFR
Anion gap: 9 (ref 5–15)
BUN: 7 mg/dL (ref 6–20)
CO2: 24 mmol/L (ref 22–32)
Calcium: 8.9 mg/dL (ref 8.9–10.3)
Chloride: 103 mmol/L (ref 98–111)
Creatinine, Ser: 0.5 mg/dL — ABNORMAL LOW (ref 0.61–1.24)
GFR, Estimated: >60 mL/min
Glucose, Bld: 90 mg/dL (ref 70–99)
Potassium: 3.6 mmol/L (ref 3.5–5.1)
Sodium: 136 mmol/L (ref 135–145)

## 2022-11-14 LAB — MAGNESIUM: Magnesium: 1.5 mg/dL — ABNORMAL LOW (ref 1.7–2.4)

## 2022-11-14 MED ORDER — MAGNESIUM SULFATE 2 GM/50ML IV SOLN
2.0000 g | Freq: Once | INTRAVENOUS | Status: AC
  Administered 2022-11-14: 2 g via INTRAVENOUS
  Filled 2022-11-14: qty 50

## 2022-11-14 MED ORDER — MAGNESIUM SULFATE 4 GM/100ML IV SOLN
4.0000 g | Freq: Once | INTRAVENOUS | Status: AC
  Administered 2022-11-14: 4 g via INTRAVENOUS
  Filled 2022-11-14: qty 100

## 2022-11-14 MED ORDER — METOPROLOL TARTRATE 50 MG PO TABS
50.0000 mg | ORAL_TABLET | Freq: Two times a day (BID) | ORAL | Status: DC
  Administered 2022-11-14 (×2): 50 mg via ORAL
  Filled 2022-11-14 (×3): qty 1

## 2022-11-14 MED ORDER — LORAZEPAM 2 MG/ML IJ SOLN: 1.0000 mg | INTRAMUSCULAR | Status: DC | PRN

## 2022-11-14 MED ORDER — METOPROLOL TARTRATE 50 MG PO TABS: 50.0000 mg | ORAL_TABLET | Freq: Two times a day (BID) | ORAL | Status: DC

## 2022-11-14 MED ORDER — METOPROLOL TARTRATE 50 MG PO TABS
50.0000 mg | ORAL_TABLET | Freq: Once | ORAL | Status: AC
  Administered 2022-11-14: 50 mg via ORAL
  Filled 2022-11-14: qty 1

## 2022-11-14 MED ORDER — PHENOBARBITAL 32.4 MG PO TABS
64.8000 mg | ORAL_TABLET | Freq: Three times a day (TID) | ORAL | Status: DC
  Administered 2022-11-14 (×4): 64.8 mg via ORAL
  Filled 2022-11-14 (×4): qty 2

## 2022-11-14 NOTE — Progress Notes (Signed)
   11/14/22 1230  Spiritual Encounters  Type of Visit Attempt (pt unavailable)  Referral source Nurse (RN/NT/LPN)  Reason for visit Advance directives  OnCall Visit No   [facility] responded to request for an Advance Directive. This [place] spoke with the RN, Amber and informed, we are unable to complete the A.D. at this time because the patient has contact restriction. Amber said she understood and stated the patient is not able to make decisions for himself at this time. This note was prepared by Jeanine Luz, M.Div..  For questions please contact by phone [phone].

## 2022-11-14 NOTE — Progress Notes (Signed)
Occupational Therapy Treatment Patient Details Name: Jose [name]. MRN: [id number] DOB: 04/20/1971 Today's Date: 11/14/2022   History of present illness Patient is a 52 yo male presenting to the ED with seizures, and hypotensive on 11/10/21.Found to have Afib with heparin administered on 11/11/21. PMH: alcohol abuse, alcohol-related seizure, hypertension, CHF, hyperlipidemia, tobacco use   OT comments  Patient in bilateral wrist restraints upon OT arrival, and soiled in BM. Patient requiring full ADL session which he was able to complete in standing, occasionally reaching out for surfaces to maintain balance. Patient requiring cues for safety and sequencing throughout, often attempting to use a washcloth will stool on other parts of his body (suspect due to DTs). OT recommendation remains appropriate, will continue to follow acutely.    Recommendations for follow up therapy are one component of a multi-disciplinary discharge planning process, led by the attending physician.  Recommendations may be updated based on patient status, additional functional criteria and insurance authorization.    Follow Up Recommendations  No OT follow up     Assistance Recommended at Discharge Intermittent Supervision/Assistance  Patient can return home with the following  A little help with walking and/or transfers;A little help with bathing/dressing/bathroom;Assistance with cooking/housework;Assist for transportation;Help with stairs or ramp for entrance   Equipment Recommendations  None recommended by OT    Recommendations for Other Services      Precautions / Restrictions Precautions Precautions: Other (comment) Precaution Comments: watch HR Restrictions Weight Bearing Restrictions: No       Mobility Bed Mobility Overal bed mobility: Independent                  Transfers Overall transfer level: Independent Equipment used: None Transfers: Sit to/from Stand Sit to Stand: Supervision,  Independent           General transfer comment: initially supervision for safety     Balance Overall balance assessment: Mild deficits observed, not formally tested                                         ADL either performed or assessed with clinical judgement   ADL Overall ADL's : Needs assistance/impaired         Upper Body Bathing: Supervision/ safety;Standing;Cueing for sequencing;Cueing for safety   Lower Body Bathing: Supervison/ safety;Sit to/from stand;Cueing for safety;Cueing for sequencing   Upper Body Dressing : Modified independent;Sitting   Lower Body Dressing: Supervision/safety;Sit to/from stand;Sitting/lateral leans Lower Body Dressing Details (indicate cue type and reason): donning and doffing socks Toilet Transfer: Supervision/safety;Ambulation Toilet Transfer Details (indicate cue type and reason): distant supervision for safety Toileting- Clothing Manipulation and Hygiene: Modified independent;Sitting/lateral lean       Functional mobility during ADLs: Supervision/safety General ADL Comments: patient found soiled requiring full ADL session, patient requiring cues for safety and sequencing throughout, often attempting to use a washcloth will stool on other parts of his body    Extremity/Trunk Assessment              Vision       Perception     Praxis      Cognition Arousal/Alertness: Awake/alert Behavior During Therapy: WFL for tasks assessed/performed Overall Cognitive Status: Difficult to assess Area of Impairment: Attention, Memory, Safety/judgement, Following commands, Awareness, Problem solving                   Current Attention Level:  Sustained Memory: Decreased short-term memory Following Commands: Follows one step commands with increased time, Follows multi-step commands inconsistently Safety/Judgement: Decreased awareness of safety, Decreased awareness of deficits Awareness: Emergent Problem Solving:  Requires verbal cues, Difficulty sequencing, Decreased initiation General Comments: patient found soiled requiring full ADL session, patient requiring cues for safety and sequencing throughout, often attempting to use a washcloth will stool on other parts of his body        Exercises      Shoulder Instructions       General Comments      Pertinent Vitals/ Pain       Pain Assessment Pain Assessment: No/denies pain  Home Living                                          Prior Functioning/Environment              Frequency  Min 2X/week        Progress Toward Goals  OT Goals(current goals can now be found in the care plan section)  Progress towards OT goals: Progressing toward goals  Acute Rehab OT Goals Patient Stated Goal: go home OT Goal Formulation: With patient Time For Goal Achievement: 11/26/22 Potential to Achieve Goals: Good  Plan Discharge plan remains appropriate    Co-evaluation                 AM-PAC OT "6 Clicks" Daily Activity     Outcome Measure   Help from another person eating meals?: None Help from another person taking care of personal grooming?: A Little Help from another person toileting, which includes using toliet, bedpan, or urinal?: A Little Help from another person bathing (including washing, rinsing, drying)?: A Little Help from another person to put on and taking off regular upper body clothing?: None Help from another person to put on and taking off regular lower body clothing?: A Little 6 Click Score: 20    End of Session    OT Visit Diagnosis: Unsteadiness on feet (R26.81);Muscle weakness (generalized) (M62.81)   Activity Tolerance Patient tolerated treatment well   Patient Left in bed;with call bell/phone within reach;with bed alarm set;with restraints reapplied   Nurse Communication Mobility status        Time: 8546-2703 OT Time Calculation (min): 35 min  Charges: OT General Charges $OT  Visit: 1 Visit OT Treatments $Self Care/Home Management : 23-37 mins  [place]. [place] Mokry, OTR/L Acute Rehabilitation Services [phone]   Ascencion Dike 11/14/2022, 3:03 PM

## 2022-11-14 NOTE — TOC Initial Note (Signed)
Transition of Care (TOC) - Initial/Assessment Note    Patient Details  Name: Jose [name]. MRN: [id number] Date of Birth: May 09, 1971  Transition of Care [facility]) CM/SW Contact:    Pollie Friar, RN Phone Number: 11/14/2022, 12:57 PM  Clinical Narrative:                 Pt states he lives at home with his nephew. He says his mother and brother are in and out of the home.  No DME. Pts states family provides needed transportation.  He denies any issues with his home medications. No f/u  per PT/OT. TOC following.  Expected Discharge Plan: Home/Self Care Barriers to Discharge: Continued Medical Work up   Patient Goals and CMS Choice            Expected Discharge Plan and Services   Discharge Planning Services: CM Consult   Living arrangements for the past 2 months: Single Family Home                                      Prior Living Arrangements/Services Living arrangements for the past 2 months: Single Family Home Lives with:: Relatives Patient language and need for interpreter reviewed:: Yes Do you feel safe going back to the place where you live?: Yes        Care giver support system in place?: No (comment)   Criminal Activity/Legal Involvement Pertinent to Current Situation/Hospitalization: No - Comment as needed  Activities of Daily Living Home Assistive Devices/Equipment: None ADL Screening (condition at time of admission) Patient's cognitive ability adequate to safely complete daily activities?: Yes Is the patient deaf or have difficulty hearing?: Yes Does the patient have difficulty seeing, even when wearing glasses/contacts?: No Does the patient have difficulty concentrating, remembering, or making decisions?: No Patient able to express need for assistance with ADLs?: Yes Does the patient have difficulty dressing or bathing?: No Independently performs ADLs?: Yes (appropriate for developmental age) Does the patient have difficulty walking or climbing  stairs?: No Weakness of Legs: Both Weakness of Arms/Hands: Both  Permission Sought/Granted                  Emotional Assessment Appearance:: Appears stated age Attitude/Demeanor/Rapport: Engaged Affect (typically observed): Accepting Orientation: : Oriented to Self, Oriented to Place   Psych Involvement: No (comment)  Admission diagnosis:  Seizure ([place]) [R56.9] Thrombocytopenia ([place]) [D69.6] Seizure disorder ([place]) [G40.909] New onset atrial fibrillation ([place]) [I48.91] Patient Active Problem List   Diagnosis Date Noted   RSV (acute bronchiolitis due to respiratory syncytial virus) 11/13/2022   Seizure disorder ([place]) 41/28/7867   Acute metabolic encephalopathy 67/20/9470   Thrombocytopenia ([place]) 11/11/2022   Elevated LFTs 11/11/2022   Atrial fibrillation with RVR ([place]) 11/11/2022   Right knee pain 11/11/2022   Hypomagnesemia 11/11/2022   Prediabetes 08/24/2022   Acute CHF (congestive heart failure) ([place]) 96/28/3662   Alcoholic intoxication without complication ([place]) 94/76/5465   Chronic heart failure with preserved ejection fraction ([place]) 09/08/2020   Iron deficiency anemia due to chronic blood loss    Family history of colon cancer 06/28/2016   Seizures ([place]) 04/30/2015   Hypertension 04/30/2015   Alcohol abuse 04/30/2015   Epilepsy ([place]) 04/30/2015   Tobacco dependence 04/30/2015   COPD (chronic obstructive pulmonary disease) ([place]) 04/30/2015   Lumbago 04/30/2015   Hypercholesterolemia 04/30/2015   Chronic pain 04/30/2015   Neck pain 04/22/2013  PCP:  Jon Billings, NP Pharmacy:   [facility] [address]), [place] [address] [place]) [place] [address] Phone: [phone] Fax: [place] [address] Phone: [phone] Fax: [phone]     Social Determinants of Health (SDOH) Social History: SDOH  Screenings   Food Insecurity: No Food Insecurity (11/11/2022)  Housing: Low Risk  (11/11/2022)  Transportation Needs: No Transportation Needs (11/11/2022)  Utilities: Not At Risk (11/11/2022)  Depression (PHQ2-9): Low Risk  (08/24/2022)  Financial Resource Strain: Low Risk  (07/01/2019)  Physical Activity: Sufficiently Active (07/01/2019)  Social Connections: Moderately Isolated (07/01/2019)  Stress: No Stress Concern Present (09/02/2019)  Tobacco Use: High Risk (11/11/2022)   SDOH Interventions:     Readmission Risk Interventions     No data to display

## 2022-11-14 NOTE — Progress Notes (Signed)
Patient was not in restraints at 1900 and had been out for several hours per report. Order has been discontinued.

## 2022-11-14 NOTE — Progress Notes (Signed)
Progress Note  Patient Name: Jose [name]. Date of Encounter: 11/14/2022  Primary Cardiologist: Kowalski/Hochrein  Subjective   No complaints afib   Inpatient Medications    Scheduled Meds:  carbamazepine  200 mg Oral BID   chlordiazePOXIDE  15 mg Oral TID   diclofenac Sodium  2 g Topical QID   diltiazem  120 mg Oral Q8H   feeding supplement  237 mL Oral BID BM   fluticasone furoate-vilanterol  1 puff Inhalation Daily   And   umeclidinium bromide  1 puff Inhalation Daily   folic acid  1 mg Oral Daily   multivitamin with minerals  1 tablet Oral Daily   nicotine  21 mg Transdermal Daily   pantoprazole  40 mg Oral Daily   phenobarbital  64.8 mg Oral TID   [START ON 11/19/2022] thiamine (VITAMIN B1) injection  100 mg Intravenous Daily   Continuous Infusions:  magnesium sulfate bolus IVPB     magnesium sulfate bolus IVPB 50 mL/hr at 11/14/22 0620   thiamine (VITAMIN B1) injection Stopped (11/14/22 0038)   PRN Meds: acetaminophen **OR** acetaminophen, albuterol, diltiazem, haloperidol lactate, HYDROcodone-acetaminophen, LORazepam   Vital Signs    Vitals:   11/13/22 2003 11/13/22 2345 11/13/22 2346 11/14/22 0337  BP: (!) 131/95 (!) 122/98 (!) 122/98 (!) 135/106  Pulse: 92 (!) 128 (!) 128 98  Resp: 20 (!) 24 (!) 24 (!) 21  Temp: 97.8 F (36.6 C) 98.2 F (36.8 C)  98.2 F (36.8 C)  TempSrc: Axillary Axillary  Axillary  SpO2: 95% 98%  96%  Weight:      Height:        Intake/Output Summary (Last 24 hours) at 11/14/2022 0734 Last data filed at 11/14/2022 0620 Gross per 24 hour  Intake 137.08 ml  Output --  Net 137.08 ml   Filed Weights   11/10/22 2200 11/11/22 2155  Weight: 95 kg 91.2 kg    Telemetry     Personally reviewed, HR 120-140s  ECG    AFib  Physical Exam   Affect appropriate Chronically ill male  HEENT: normal Neck supple with no adenopathy JVP normal no bruits no thyromegaly Lungs clear with no wheezing and good diaphragmatic  motion Heart:  S1/S2 no murmur, no rub, gallop or click PMI normal Abdomen: benighn, BS positve, no tenderness, no AAA no bruit.  No HSM or HJR Distal pulses intact with no bruits No edema Neuro non-focal Skin warm and dry No muscular weakness   Labs    Chemistry Recent Labs  Lab 11/10/22 1749 11/11/22 0114 11/12/22 0545 11/13/22 0304 11/14/22 0448  NA 137   < > 134* 136 136  K 4.2   < > 3.7 4.0 3.6  CL 101   < > 101 102 103  CO2 22   < > '22 24 24  '$ GLUCOSE 107*   < > 120* 101* 90  BUN 26*   < > '7 7 7  '$ CREATININE 0.73   < > 0.58* 0.60* 0.50*  CALCIUM 8.2*   < > 7.9* 8.6* 8.9  PROT 5.8*  --   --   --   --   ALBUMIN 3.1*  --   --   --   --   AST 93*  --   --   --   --   ALT 48*  --   --   --   --   ALKPHOS 103  --   --   --   --  BILITOT 1.0  --   --   --   --   GFRNONAA >60   < > >60 >60 >60  ANIONGAP 14   < > '11 10 9   '$ < > = values in this interval not displayed.     Hematology Recent Labs  Lab 11/11/22 0630 11/12/22 0545 11/13/22 0304  WBC 4.2 4.9 5.2  RBC 3.60* 3.52* 3.54*  HGB 11.6* 11.1* 11.5*  HCT 33.6* 33.3* 32.8*  MCV 93.3 94.6 92.7  MCH 32.2 31.5 32.5  MCHC 34.5 33.3 35.1  RDW 15.5 15.4 15.7*  PLT 49* 49* 48*    Cardiac Enzymes Recent Labs  Lab 11/11/22 0004 11/11/22 0352  TROPONINIHS 8 8    BNP Recent Labs  Lab 11/11/22 0630 11/12/22 0545 11/13/22 0304  BNP 524.6* 838.6* 883.4*     DDimerNo results for input(s): "DDIMER" in the last 168 hours.   Radiology    No results found.  Patient profile   Patient is a 52 year old M known to have HTN, HLD, nicotine abuse, COPD, alcohol abuse, history of seizure and chronic diastolic HFpEF is currently admitted to hospitalist team for the management of RSV infection and new occurrence of A-fib with RVR.  Assessment & Plan    # New onset of A-fib with RVR in the setting of RSV infection # Alcohol abuse and alcohol withdrawal seizure -no AC due to ETOH abuse and low PLT with fall risk   - on cardizem PO add lopressor 50 mg bid  # Chronic diastolic heart failure, BNP 524, compensated # RSV infection: Management per primary team   Signed, Jenkins Rouge, MD  11/14/2022, 7:34 AM

## 2022-11-14 NOTE — Progress Notes (Addendum)
PROGRESS NOTE                                                                                                                                                                                                             Patient Demographics:    Jose [name], is a 52 y.o. male, DOB - 1971/06/28, SHF:[id number]  Outpatient Primary MD for the patient is Jon Billings, NP    LOS - 3  Admit date - 11/10/2022    Chief Complaint  Patient presents with   Seizures       Brief Narrative (HPI from H&P)   52 y.o. male with medical history significant of   chronic diastolic CHF, COPD, tobacco alcohol abuse, hypertension, seizures not compliant with his medications.  Patient was not feeling good for the last few days and had cut down his alcohol intake, he subsequently started getting jittery and felt like he was going to have a seizure. He subsequently had a seizure-like episode and was brought to the ER, in the ER he was also found to have new onset A-fib RVR.  He was seen by neurology.  Head CT was nonacute.  He was admitted to the hospital for further care.    Subjective:   Patient in bed, appears comfortable, denies any headache, no fever, no chest pain or pressure, no shortness of breath , no abdominal pain. No new focal weakness.   Assessment  & Plan :   Breakthrough seizure in a patient with history of seizures, noncompliant with antiepileptic medications and ongoing alcohol abuse. He was noncompliant with his seizure medications and was also abruptly trying to taper off his alcohol intake, likely seizure due to combination of alcohol withdrawal and subtherapeutic levels of his antiseizure medications.  Head CT stable, EEG nonacute, his home medication, for the next 3 days he will get Keppra overlap.  Has been counseled on compliance.  Seen by neurology.  Has been counseled not to drive for 6 months.  Ongoing alcohol and nicotine abuse.   Currently in DTs and alcohol withdrawal.  Placed on Librium, phenobarbital, PRN Haldol along with CIWA protocol.  Counseled to quit.  Had soft restraints night of 11/13/2022, improved on 11/14/2022 after commencement of FeNA bar.  New onset A-fib RVR.  Mali vas 2 score of 3.  He is an alcoholic, due to  his underlying thrombocytopenia, noncompliance with his medications he is a poor candidate for anticoagulation long-term, remains in RVR despite multiple doses of IV digoxin and oral Cardizem due to DTs, added Cardizem drip on 11/13/2022, received a dose of Lopressor early morning 11/14/2022 at 6 AM after which rate much improved, cardiology has initiated scheduled beta-blocker, will continue to monitor if rate stays stable persistently under 100 will try to titrate off IV Cardizem drip.  Hypertension.  Blood pressure stable on present regimen of Cardizem and beta-blocker.  Monitor  Acute on chronic thrombocytopenia.  Likely due to underlying alcohol use and abuse.  Ultrasound suggestive of fatty liver.  Outpatient monitoring by PCP.  Acute metabolic encephalopathy.  Likely postictal as well.  Stable now.  May worsen again as he is going into DTs.  Asymptomatic transaminitis with fatty liver on ultrasound.  Due to alcohol abuse.  Counseled to quit.  COPD.  Stable.  Chronic diastolic CHF last EF 57%.  Currently dehydrated but has been adequately hydrated now.  Fall with right knee pain.  X-ray shows chronic changes.  Supportive care.  PT OT.  Hypomagnesemia.  Replaced.        Condition - Fair  Family Communication  : None present  Code Status :  Full  Consults  :  Neuro, Cards  PUD Prophylaxis : PPI   Procedures  :     TTE -   1. LVEF challenging with afib/ PVC. Left ventricular ejection fraction, by estimation, is 50 to 55%. The left ventricle has low normal function. The left ventricle has no regional wall motion abnormalities. There is mild left ventricular hypertrophy. Left ventricular  diastolic parameters are indeterminate.  2. Right ventricular systolic function is normal. The right ventricular size is normal. There is mildly elevated pulmonary artery systolic pressure.  3. Left atrial size was moderately dilated.  4. Right atrial size was moderately dilated.  5. Trivial mitral valve regurgitation.  6. The aortic valve is tricuspid. Aortic valve regurgitation is not visualized.  7. There is mild dilatation of the ascending aorta, measuring 39 mm.  8. The inferior vena cava is normal in size with greater than 50% respiratory variability, suggesting right atrial pressure of 3 mmHg.   EEG - Non acute  Right upper quadrant ultrasound.  Fatty liver.    CT head - non acute      Disposition Plan  :    Status is: Observation  DVT Prophylaxis  : SCDs    Lab Results  Component Value Date   PLT 48 (L) 11/13/2022    Diet :  Diet Order             DIET SOFT Fluid consistency: Thin  Diet effective now                    Inpatient Medications  Scheduled Meds:  carbamazepine  200 mg Oral BID   chlordiazePOXIDE  15 mg Oral TID   diclofenac Sodium  2 g Topical QID   diltiazem  120 mg Oral Q8H   feeding supplement  237 mL Oral BID BM   fluticasone furoate-vilanterol  1 puff Inhalation Daily   And   umeclidinium bromide  1 puff Inhalation Daily   folic acid  1 mg Oral Daily   metoprolol tartrate  50 mg Oral BID   multivitamin with minerals  1 tablet Oral Daily   nicotine  21 mg Transdermal Daily   pantoprazole  40 mg Oral Daily  phenobarbital  64.8 mg Oral TID   [START ON 11/19/2022] thiamine (VITAMIN B1) injection  100 mg Intravenous Daily   Continuous Infusions:  magnesium sulfate bolus IVPB     thiamine (VITAMIN B1) injection Stopped (11/14/22 0038)   PRN Meds:.acetaminophen **OR** acetaminophen, albuterol, diltiazem, haloperidol lactate, HYDROcodone-acetaminophen, LORazepam  Antibiotics  :    Anti-infectives (From admission, onward)    None          Objective:   Vitals:   11/13/22 2003 11/13/22 2345 11/13/22 2346 11/14/22 0337  BP: (!) 131/95 (!) 122/98 (!) 122/98 (!) 135/106  Pulse: 92 (!) 128 (!) 128 98  Resp: 20 (!) 24 (!) 24 (!) 21  Temp: 97.8 F (36.6 C) 98.2 F (36.8 C)  98.2 F (36.8 C)  TempSrc: Axillary Axillary  Axillary  SpO2: 95% 98%  96%  Weight:      Height:        Wt Readings from Last 3 Encounters:  11/11/22 91.2 kg  10/20/22 95 kg  10/06/22 96.6 kg     Intake/Output Summary (Last 24 hours) at 11/14/2022 0921 Last data filed at 11/14/2022 0620 Gross per 24 hour  Intake 137.08 ml  Output --  Net 137.08 ml     Physical Exam  Awake Alert, No new F.N deficits, in Dts with mild agitation [place].AT,PERRAL Supple Neck, No JVD,   Symmetrical Chest wall movement, Good air movement bilaterally, CTAB iRRR,No Gallops, Rubs or new Murmurs,  +ve B.Sounds, Abd Soft, No tenderness,   No Cyanosis, Clubbing or edema      Data Review:    Recent Labs  Lab 11/10/22 1749 11/11/22 0114 11/11/22 0352 11/11/22 0630 11/12/22 0545 11/13/22 0304  WBC 4.1  --  4.5 4.2 4.9 5.2  HGB 12.6* 12.9* 11.7* 11.6* 11.1* 11.5*  HCT 38.0* 38.0* 34.1* 33.6* 33.3* 32.8*  PLT 53*  --  52* 49* 49* 48*  MCV 95.0  --  93.2 93.3 94.6 92.7  MCH 31.5  --  32.0 32.2 31.5 32.5  MCHC 33.2  --  34.3 34.5 33.3 35.1  RDW 15.4  --  15.4 15.5 15.4 15.7*  LYMPHSABS 1.0  --   --   --  1.1 1.1  MONOABS 0.5  --   --   --  0.7 0.9  EOSABS 0.0  --   --   --  0.1 0.1  BASOSABS 0.0  --   --   --  0.0 0.0    Recent Labs  Lab 11/10/22 1749 11/11/22 0004 11/11/22 0114 11/11/22 0429 11/11/22 0630 11/12/22 0545 11/13/22 0304 11/14/22 0448  NA 137  --  138  --  136 134* 136 136  K 4.2  --  3.6  --  3.8 3.7 4.0 3.6  CL 101  --   --   --  103 101 102 103  CO2 22  --   --   --  '24 22 24 24  '$ ANIONGAP 14  --   --   --  '9 11 10 9  '$ GLUCOSE 107*  --   --   --  81 120* 101* 90  BUN 26*  --   --   --  '18 7 7 7  '$ CREATININE 0.73  --   --   --   0.69 0.58* 0.60* 0.50*  AST 93*  --   --   --   --   --   --   --   ALT 48*  --   --   --   --   --   --   --  ALKPHOS 103  --   --   --   --   --   --   --   BILITOT 1.0  --   --   --   --   --   --   --   ALBUMIN 3.1*  --   --   --   --   --   --   --   LATICACIDVEN  --  0.9  --  0.8  --   --   --   --   INR  --  1.2  --   --   --   --   --   --   TSH  --  1.436  --   --   --   --   --   --   AMMONIA  --  39*  --   --   --   --   --   --   BNP  --   --   --   --  524.6* 838.6* 883.4*  --   MG  --  1.0*  --   --   --  1.4* 1.7 1.5*  CALCIUM 8.2*  --   --   --  7.6* 7.9* 8.6* 8.9       Radiology Reports ECHOCARDIOGRAM COMPLETE  Result Date: 11/11/2022    ECHOCARDIOGRAM REPORT   Patient Name:   Jatavian Calica. Date of Exam: 11/11/2022 Medical Rec #:  [id number]      Height:       70.5 in Accession #:    [id number]     Weight:       209.4 lb Date of Birth:  31-Jan-1971       BSA:          2.140 m Patient Age:    31 years       BP:           126/99 mmHg Patient Gender: M              HR:           115 bpm. Exam Location:  Inpatient Procedure: 2D Echo, Cardiac Doppler and Color Doppler Indications:    I48.91* Unspeicified atrial fibrillation  History:        Patient has prior history of Echocardiogram examinations, most                 recent 11/05/2021. CHF; Risk Factors:Hypertension, Dyslipidemia                 and ETOH.  Sonographer:    Bernadene Person RDCS Referring Phys: Toy Baker IMPRESSIONS  1. LVEF challenging with afib/ PVC. Left ventricular ejection fraction, by estimation, is 50 to 55%. The left ventricle has low normal function. The left ventricle has no regional wall motion abnormalities. There is mild left ventricular hypertrophy. Left ventricular diastolic parameters are indeterminate.  2. Right ventricular systolic function is normal. The right ventricular size is normal. There is mildly elevated pulmonary artery systolic pressure.  3. Left atrial size was moderately dilated.  4.  Right atrial size was moderately dilated.  5. Trivial mitral valve regurgitation.  6. The aortic valve is tricuspid. Aortic valve regurgitation is not visualized.  7. There is mild dilatation of the ascending aorta, measuring 39 mm.  8. The inferior vena cava is normal in size with greater than 50% respiratory variability, suggesting right atrial pressure of 3 mmHg. FINDINGS  Left  Ventricle: LVEF challenging with afib/ PVC. Left ventricular ejection fraction, by estimation, is 50 to 55%. The left ventricle has low normal function. The left ventricle has no regional wall motion abnormalities. The left ventricular internal cavity size was normal in size. There is mild left ventricular hypertrophy. Left ventricular diastolic parameters are indeterminate. Right Ventricle: The right ventricular size is normal. Right ventricular systolic function is normal. There is mildly elevated pulmonary artery systolic pressure. The tricuspid regurgitant velocity is 2.87 m/s, and with an assumed right atrial pressure of 8 mmHg, the estimated right ventricular systolic pressure is 24.2 mmHg. Left Atrium: Left atrial size was moderately dilated. Right Atrium: Right atrial size was moderately dilated. Pericardium: There is no evidence of pericardial effusion. Mitral Valve: Trivial mitral valve regurgitation. Tricuspid Valve: Tricuspid valve regurgitation is mild. Aortic Valve: The aortic valve is tricuspid. Aortic valve regurgitation is not visualized. Aortic regurgitation PHT measures 388 msec. Pulmonic Valve: Pulmonic valve regurgitation is not visualized. Aorta: There is mild dilatation of the ascending aorta, measuring 39 mm. Venous: The inferior vena cava is normal in size with greater than 50% respiratory variability, suggesting right atrial pressure of 3 mmHg. IAS/Shunts: The interatrial septum was not well visualized.  LEFT VENTRICLE PLAX 2D LVIDd:         5.00 cm LVIDs:         3.50 cm LV PW:         1.10 cm LV IVS:        1.00  cm LVOT diam:     2.50 cm LV SV:         53 LV SV Index:   25 LVOT Area:     4.91 cm  LV Volumes (MOD) LV vol d, MOD A2C: 135.0 ml LV vol d, MOD A4C: 136.0 ml LV vol s, MOD A2C: 79.7 ml LV vol s, MOD A4C: 69.7 ml LV SV MOD A2C:     55.3 ml LV SV MOD A4C:     136.0 ml LV SV MOD BP:      62.5 ml RIGHT VENTRICLE TAPSE (M-mode): 1.8 cm LEFT ATRIUM              Index        RIGHT ATRIUM           Index LA diam:        4.20 cm  1.96 cm/m   RA Area:     26.40 cm LA Vol (A2C):   93.7 ml  43.79 ml/m  RA Volume:   88.80 ml  41.50 ml/m LA Vol (A4C):   101.0 ml 47.20 ml/m LA Biplane Vol: 98.5 ml  46.03 ml/m  AORTIC VALVE LVOT Vmax:   67.42 cm/s LVOT Vmean:  45.950 cm/s LVOT VTI:    0.108 m AI PHT:      388 msec  AORTA Ao Root diam: 4.40 cm Ao Asc diam:  3.90 cm TRICUSPID VALVE TR Peak grad:   32.9 mmHg TR Vmax:        287.00 cm/s  SHUNTS Systemic VTI:  0.11 m Systemic Diam: 2.50 cm Phineas Inches Electronically signed by Phineas Inches Signature Date/Time: 11/11/2022/11:18:12 AM    Final    EEG adult  Result Date: 11/11/2022 Lora Havens, MD     11/11/2022  8:36 AM Patient Name: Jose [name]. MRN: [id number] Epilepsy Attending: Lora Havens Referring Physician/Provider: Toy Baker, MD Date: 11/11/2022 Duration: 22.04 mins Patient history: 52yo M with h/o epilepsy came with  seizure. EEG to evaluate for seizure. Level of alertness: Awake, asleep AEDs during EEG study: CBZ, LEV Technical aspects: This EEG study was done with scalp electrodes positioned according to the 10-20 International system of electrode placement. Electrical activity was reviewed with band pass filter of 1-'70Hz'$ , sensitivity of 7 uV/mm, display speed of 73m/sec with a '60Hz'$  notched filter applied as appropriate. EEG data were recorded continuously and digitally stored.  Video monitoring was available and reviewed as appropriate. Description: The posterior dominant rhythm consists of 9-10 Hz activity of moderate voltage (25-35 uV) seen  predominantly in posterior head regions, symmetric and reactive to eye opening and eye closing. Sleep was characterized by vertex waves, sleep spindles (12 to 14 Hz), maximal frontocentral region.  Physiologic photic driving was not seen during photic stimulation.  Hyperventilation was not performed.   IMPRESSION: This study is within normal limits. No seizures or epileptiform discharges were seen throughout the recording. A normal interictal EEG does not exclude the diagnosis of epilepsy. Priyanka OBarbra Sarks  [place]domen Limited RUQ (LIVER/GB)  Result Date: 11/11/2022 CLINICAL DATA:  Elevated liver function tests. EXAM: ULTRASOUND ABDOMEN LIMITED RIGHT UPPER QUADRANT COMPARISON:  None Available. FINDINGS: Gallbladder: No gallstones or wall thickening visualized (2.4 mm). No sonographic Murphy sign noted by sonographer. Common bile duct: Diameter: 2.8 mm Liver: No focal lesion identified. Diffusely increased echogenicity of the liver parenchyma is noted. Portal vein is patent on color Doppler imaging with normal direction of blood flow towards the liver. Other: None. IMPRESSION: Hepatic steatosis without focal liver lesions. Electronically Signed   By: TVirgina NorfolkM.D.   On: 11/11/2022 02:14   CT HEAD WO CONTRAST (5MM)  Result Date: 11/11/2022 CLINICAL DATA:  Status post seizure. EXAM: CT HEAD WITHOUT CONTRAST TECHNIQUE: Contiguous axial images were obtained from the base of the skull through the vertex without intravenous contrast. RADIATION DOSE REDUCTION: This exam was performed according to the departmental dose-optimization program which includes automated exposure control, adjustment of the mA and/or kV according to patient size and/or use of iterative reconstruction technique. COMPARISON:  November 10, 2022 FINDINGS: Brain: There is mild cerebral atrophy with widening of the extra-axial spaces and ventricular dilatation. There are areas of decreased attenuation within the white matter tracts of the  supratentorial brain, consistent with microvascular disease changes. Vascular: There is mild to moderate severity calcification of the bilateral cavernous carotid arteries. Skull: Small, bilateral nondisplaced nasal bone fractures are seen. These are of indeterminate age. Sinuses/Orbits: There is marked severity bilateral maxillary sinus and bilateral ethmoid sinus mucosal thickening. Other: None. IMPRESSION: 1. No acute intracranial abnormality. 2. Generalized cerebral atrophy with chronic white matter small vessel ischemic changes. 3. Marked severity bilateral maxillary sinus and bilateral ethmoid sinus disease. Electronically Signed   By: TVirgina NorfolkM.D.   On: 11/11/2022 01:38   DG Knee 1-2 Views Right  Result Date: 11/11/2022 CLINICAL DATA:  Recent fall with knee pain, initial encounter EXAM: RIGHT KNEE - 2 VIEW COMPARISON:  07/09/2021 FINDINGS: No acute fracture or dislocation is noted. Degenerative changes of the knee joint are seen. Mild soft tissue swelling is noted in the suprapatellar region likely related to the recent injury. IMPRESSION: Degenerative change without acute bony abnormality. Electronically Signed   By: MInez CatalinaM.D.   On: 11/11/2022 01:21   DG CHEST PORT 1 VIEW  Result Date: 11/11/2022 CLINICAL DATA:  Syncope. EXAM: PORTABLE CHEST 1 VIEW COMPARISON:  None Available. FINDINGS: The cardiac silhouette is enlarged and unchanged in size.  There is moderate severity calcification of the aortic arch. Mild, bilateral perihilar atelectatic changes are seen. There is no evidence of a pleural effusion or pneumothorax. The visualized skeletal structures are unremarkable. IMPRESSION: Cardiomegaly with mild bilateral perihilar atelectatic changes. Electronically Signed   By: Virgina Norfolk M.D.   On: 11/11/2022 00:28   DG Lumbar Spine Complete  Result Date: 11/10/2022 CLINICAL DATA:  Fall. EXAM: LUMBAR SPINE - COMPLETE 4+ VIEW COMPARISON:  None Available. FINDINGS: No fracture or  traumatic malalignment. Multilevel degenerative disc disease, most prominent in the lower lumbar spine. Lower lumbar facet degenerative changes. No other bony or soft tissue abnormalities identified. IMPRESSION: No fracture or traumatic malalignment. Degenerative changes as above. Electronically Signed   By: Dorise Bullion III M.D.   On: 11/10/2022 17:51   CT Head Wo Contrast  Result Date: 11/10/2022 CLINICAL DATA:  Seizures, new onset.  Head and facial trauma. EXAM: CT HEAD WITHOUT CONTRAST CT MAXILLOFACIAL WITHOUT CONTRAST TECHNIQUE: Multidetector CT imaging of the head and maxillofacial structures were performed using the standard protocol without intravenous contrast. Multiplanar CT image reconstructions of the maxillofacial structures were also generated. RADIATION DOSE REDUCTION: This exam was performed according to the departmental dose-optimization program which includes automated exposure control, adjustment of the mA and/or kV according to patient size and/or use of iterative reconstruction technique. COMPARISON:  None Available. FINDINGS: CT HEAD FINDINGS Brain: No evidence of acute infarction, hemorrhage, hydrocephalus, extra-axial collection or mass lesion/mass effect. Mild generalized cerebral atrophy. Patchy area of low-attenuation of the periventricular white matter presumed chronic microvascular ischemic changes. Vascular: No hyperdense vessel or unexpected calcification. Skull: Normal. Negative for fracture or focal lesion. Other: None. CT MAXILLOFACIAL FINDINGS Osseous: No fracture or mandibular dislocation. No destructive process. Orbits: Negative. No traumatic or inflammatory finding. Sinuses: Marked mucosal thickening of the bilateral maxillary sinuses with partial opacification. Mucosal thickening of the ethmoid air cells. Remaining paranasal sinuses and mastoid air cells appear clear. Soft tissues: No significant hematoma or fluid collection. IMPRESSION: CT head: 1. No acute intracranial  abnormality. 2. Mild generalized cerebral atrophy and chronic microvascular ischemic changes of the white matter. CT maxillofacial: 1. No acute facial bone fracture. 2. Bilateral maxillary and ethmoid sinus disease. Electronically Signed   By: Keane Police D.O.   On: 11/10/2022 17:33   CT Maxillofacial Wo Contrast  Result Date: 11/10/2022 CLINICAL DATA:  Seizures, new onset.  Head and facial trauma. EXAM: CT HEAD WITHOUT CONTRAST CT MAXILLOFACIAL WITHOUT CONTRAST TECHNIQUE: Multidetector CT imaging of the head and maxillofacial structures were performed using the standard protocol without intravenous contrast. Multiplanar CT image reconstructions of the maxillofacial structures were also generated. RADIATION DOSE REDUCTION: This exam was performed according to the departmental dose-optimization program which includes automated exposure control, adjustment of the mA and/or kV according to patient size and/or use of iterative reconstruction technique. COMPARISON:  None Available. FINDINGS: CT HEAD FINDINGS Brain: No evidence of acute infarction, hemorrhage, hydrocephalus, extra-axial collection or mass lesion/mass effect. Mild generalized cerebral atrophy. Patchy area of low-attenuation of the periventricular white matter presumed chronic microvascular ischemic changes. Vascular: No hyperdense vessel or unexpected calcification. Skull: Normal. Negative for fracture or focal lesion. Other: None. CT MAXILLOFACIAL FINDINGS Osseous: No fracture or mandibular dislocation. No destructive process. Orbits: Negative. No traumatic or inflammatory finding. Sinuses: Marked mucosal thickening of the bilateral maxillary sinuses with partial opacification. Mucosal thickening of the ethmoid air cells. Remaining paranasal sinuses and mastoid air cells appear clear. Soft tissues: No significant hematoma or fluid collection. IMPRESSION:  CT head: 1. No acute intracranial abnormality. 2. Mild generalized cerebral atrophy and chronic  microvascular ischemic changes of the white matter. CT maxillofacial: 1. No acute facial bone fracture. 2. Bilateral maxillary and ethmoid sinus disease. Electronically Signed   By: Keane Police D.O.   On: 11/10/2022 17:33      Signature  -   Lala Lund M.D on 11/14/2022 at 9:21 AM   -  To page go to [email or web address]

## 2022-11-14 NOTE — TOC CAGE-AID Note (Signed)
Transition of Care [facility]) - CAGE-AID Screening   Patient Details  Name: Jose [name]. MRN: [id number] Date of Birth: [birth year]  [facility] CM/SW Contact:    Pollie Friar, RN Phone Number: 11/14/2022, 12:52 PM   Clinical Narrative: Pt provided resources for inpatient/ outpatient alcohol counseling.   CAGE-AID Screening:    Have You Ever Felt You Ought to Cut Down on Your Drinking or Drug Use?: No Have People Annoyed You By Critizing Your Drinking Or Drug Use?: No Have You Felt Bad Or Guilty About Your Drinking Or Drug Use?: No Have You Ever Had a Drink or Used Drugs First Thing In The Morning to Steady Your Nerves or to Get Rid of a Hangover?: No CAGE-AID Score: 0  Substance Abuse Education Offered: Yes  Substance abuse interventions: Patient Counseling, Scientist, clinical (histocompatibility and immunogenetics)

## 2022-11-14 NOTE — Plan of Care (Signed)
  Problem: Coping: Goal: Ability to adjust to condition or change in health will improve Outcome: Progressing Goal: Ability to identify appropriate support needs will improve Outcome: Progressing   Problem: Safety: Goal: Verbalization of understanding the information provided will improve Outcome: Progressing   Problem: Self-Concept: Goal: Level of anxiety will decrease Outcome: Progressing Goal: Ability to verbalize feelings about condition will improve Outcome: Progressing

## 2022-11-15 ENCOUNTER — Encounter: Payer: BC Managed Care – PPO | Admitting: Internal Medicine

## 2022-11-15 DIAGNOSIS — I4819 Other persistent atrial fibrillation: Secondary | ICD-10-CM | POA: Diagnosis not present

## 2022-11-15 LAB — BASIC METABOLIC PANEL
Anion gap: 8 (ref 5–15)
BUN: 16 mg/dL (ref 6–20)
CO2: 25 mmol/L (ref 22–32)
Calcium: 9.2 mg/dL (ref 8.9–10.3)
Chloride: 101 mmol/L (ref 98–111)
Creatinine, Ser: 0.81 mg/dL (ref 0.61–1.24)
GFR, Estimated: >60 mL/min (ref >60)
Glucose, Bld: 97 mg/dL (ref 70–99)
Potassium: 3.9 mmol/L (ref 3.5–5.1)
Sodium: 134 mmol/L — ABNORMAL LOW (ref 135–145)

## 2022-11-15 LAB — CBC
HCT: 34.6 % — ABNORMAL LOW (ref 39.0–52.0)
Hemoglobin: 11.3 g/dL — ABNORMAL LOW (ref 13.0–17.0)
MCH: 31 pg (ref 26.0–34.0)
MCHC: 32.7 g/dL (ref 30.0–36.0)
MCV: 94.8 fL (ref 80.0–100.0)
Platelets: 81 10*3/uL — ABNORMAL LOW (ref 150–400)
RBC: 3.65 MIL/uL — ABNORMAL LOW (ref 4.22–5.81)
RDW: 15.6 % — ABNORMAL HIGH (ref 11.5–15.5)
WBC: 5 10*3/uL (ref 4.0–10.5)
nRBC: 0 % (ref 0.0–0.2)

## 2022-11-15 LAB — MAGNESIUM: Magnesium: 1.7 mg/dL (ref 1.7–2.4)

## 2022-11-15 MED ORDER — PHENOBARBITAL 32.4 MG PO TABS
64.8000 mg | ORAL_TABLET | Freq: Two times a day (BID) | ORAL | Status: DC
  Administered 2022-11-15 – 2022-11-16 (×3): 64.8 mg via ORAL
  Filled 2022-11-15 (×3): qty 2

## 2022-11-15 MED ORDER — DILTIAZEM HCL ER COATED BEADS 180 MG PO CP24
360.0000 mg | ORAL_CAPSULE | Freq: Every day | ORAL | Status: DC
  Administered 2022-11-15 – 2022-11-18 (×4): 360 mg via ORAL
  Filled 2022-11-15 (×4): qty 2

## 2022-11-15 MED ORDER — METOPROLOL TARTRATE 50 MG PO TABS
75.0000 mg | ORAL_TABLET | Freq: Two times a day (BID) | ORAL | Status: DC
  Administered 2022-11-15 – 2022-11-18 (×7): 75 mg via ORAL
  Filled 2022-11-15 (×7): qty 1

## 2022-11-15 MED ORDER — MAGNESIUM SULFATE 2 GM/50ML IV SOLN
2.0000 g | Freq: Once | INTRAVENOUS | Status: AC
  Administered 2022-11-15: 2 g via INTRAVENOUS
  Filled 2022-11-15: qty 50

## 2022-11-15 MED ORDER — CHLORDIAZEPOXIDE HCL 5 MG PO CAPS
10.0000 mg | ORAL_CAPSULE | Freq: Three times a day (TID) | ORAL | Status: DC
  Administered 2022-11-15 – 2022-11-16 (×5): 10 mg via ORAL
  Filled 2022-11-15 (×5): qty 2
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
## 2022-11-15 NOTE — Plan of Care (Signed)
  Problem: Education: Goal: Expressions of having a comfortable level of knowledge regarding the disease process will increase Outcome: Progressing   Problem: Coping: Goal: Ability to adjust to condition or change in health will improve Outcome: Progressing Goal: Ability to identify appropriate support needs will improve Outcome: Progressing   Problem: Health Behavior/Discharge Planning: Goal: Compliance with prescribed medication regimen will improve Outcome: Progressing   Problem: Medication: Goal: Risk for medication side effects will decrease Outcome: Progressing   Problem: Clinical Measurements: Goal: Complications related to the disease process, condition or treatment will be avoided or minimized Outcome: Progressing Goal: Diagnostic test results will improve Outcome: Progressing   Problem: Safety: Goal: Verbalization of understanding the information provided will improve Outcome: Progressing   Problem: Self-Concept: Goal: Level of anxiety will decrease Outcome: Progressing Goal: Ability to verbalize feelings about condition will improve Outcome: Progressing   Problem: Education: Goal: Knowledge of General Education information will improve Description: Including pain rating scale, medication(s)/side effects and non-pharmacologic comfort measures Outcome: Progressing   Problem: Health Behavior/Discharge Planning: Goal: Ability to manage health-related needs will improve Outcome: Progressing   Problem: Clinical Measurements: Goal: Ability to maintain clinical measurements within normal limits will improve Outcome: Progressing Goal: Will remain free from infection Outcome: Progressing Goal: Diagnostic test results will improve Outcome: Progressing Goal: Respiratory complications will improve Outcome: Progressing Goal: Cardiovascular complication will be avoided Outcome: Progressing   Problem: Activity: Goal: Risk for activity intolerance will  decrease Outcome: Progressing   Problem: Nutrition: Goal: Adequate nutrition will be maintained Outcome: Progressing   Problem: Coping: Goal: Level of anxiety will decrease Outcome: Progressing   Problem: Elimination: Goal: Will not experience complications related to bowel motility Outcome: Progressing Goal: Will not experience complications related to urinary retention Outcome: Progressing   Problem: Pain Managment: Goal: General experience of comfort will improve Outcome: Progressing   Problem: Safety: Goal: Ability to remain free from injury will improve Outcome: Progressing   Problem: Skin Integrity: Goal: Risk for impaired skin integrity will decrease Outcome: Progressing   Problem: Safety: Goal: Non-violent Restraint(s) Outcome: Progressing

## 2022-11-15 NOTE — Progress Notes (Signed)
PROGRESS NOTE                                                                                                                                                                                                             Patient Demographics:    Jose [name], is a 52 y.o. male, DOB - 1971-04-10, TML:[id number]  Outpatient Primary MD for the patient is Jon Billings, NP    LOS - 4  Admit date - 11/10/2022    Chief Complaint  Patient presents with   Seizures       Brief Narrative (HPI from H&P)   52 y.o. male with medical history significant of   chronic diastolic CHF, COPD, tobacco alcohol abuse, hypertension, seizures not compliant with his medications.  Patient was not feeling good for the last few days and had cut down his alcohol intake, he subsequently started getting jittery and felt like he was going to have a seizure. He subsequently had a seizure-like episode and was brought to the ER, in the ER he was also found to have new onset A-fib RVR.  He was seen by neurology.  Head CT was nonacute.  He was admitted to the hospital for further care.    Subjective:   Patient in bed, appears comfortable, denies any headache, no fever, no chest pain or pressure, no shortness of breath , no abdominal pain. No focal weakness.   Assessment  & Plan :   Breakthrough seizure in a patient with history of seizures, noncompliant with antiepileptic medications and ongoing alcohol abuse. He was noncompliant with his seizure medications and was also abruptly trying to taper off his alcohol intake, likely seizure due to combination of alcohol withdrawal and subtherapeutic levels of his antiseizure medications.  Head CT stable, EEG nonacute, his home medication, for the next 3 days he will get Keppra overlap.  Has been counseled on compliance.  Seen by neurology.  Has been counseled not to drive for 6 months.  Ongoing alcohol and nicotine abuse.   Currently in DTs and alcohol withdrawal.  Placed on Librium, phenobarbital, PRN Haldol along with CIWA protocol.  Counseled to quit.  Had soft restraints night of 11/13/2022, DTs have improved, start tapering Librium and phenobarbital 11/15/2022 likely discharge in the next 1 to 2 days if stable.  New onset A-fib RVR.  Mali vas  2 score of 3.  He is an alcoholic, due to his underlying thrombocytopenia, noncompliance with his medications he is a poor candidate for anticoagulation long-term, remains in RVR despite multiple doses of IV digoxin and oral Cardizem due to DTs, added Cardizem drip on 11/13/2022, received a dose of Lopressor early morning 11/14/2022 at 6 AM after which rate much improved, cardiology has initiated scheduled beta-blocker, rate improving with improved, titrated off Cardizem drip.  Appreciate cardiology input.  Hypertension.  Blood pressure stable on present regimen of Cardizem and beta-blocker.  Monitor  Acute on chronic thrombocytopenia.  Likely due to underlying alcohol use and abuse.  Ultrasound suggestive of fatty liver.  Outpatient monitoring by PCP.  Acute metabolic encephalopathy.  Likely postictal as well.  Stable now.  May worsen again as he is going into DTs.  Asymptomatic transaminitis with fatty liver on ultrasound.  Due to alcohol abuse.  Counseled to quit.  COPD.  Stable.  Chronic diastolic CHF last EF 00%.  Currently dehydrated but has been adequately hydrated now.  Fall with right knee pain.  X-ray shows chronic changes.  Supportive care.  PT OT.  Hypomagnesemia.  Replaced.        Condition - Fair  Family Communication  : None present  Code Status :  Full  Consults  :  Neuro, Cards  PUD Prophylaxis : PPI   Procedures  :     TTE -   1. LVEF challenging with afib/ PVC. Left ventricular ejection fraction, by estimation, is 50 to 55%. The left ventricle has low normal function. The left ventricle has no regional wall motion abnormalities. There is mild  left ventricular hypertrophy. Left ventricular diastolic parameters are indeterminate.  2. Right ventricular systolic function is normal. The right ventricular size is normal. There is mildly elevated pulmonary artery systolic pressure.  3. Left atrial size was moderately dilated.  4. Right atrial size was moderately dilated.  5. Trivial mitral valve regurgitation.  6. The aortic valve is tricuspid. Aortic valve regurgitation is not visualized.  7. There is mild dilatation of the ascending aorta, measuring 39 mm.  8. The inferior vena cava is normal in size with greater than 50% respiratory variability, suggesting right atrial pressure of 3 mmHg.   EEG - Non acute  Right upper quadrant ultrasound.  Fatty liver.    CT head - non acute      Disposition Plan  :    Status is: Observation  DVT Prophylaxis  : SCDs    Lab Results  Component Value Date   PLT 81 (L) 11/15/2022    Diet :  Diet Order             DIET SOFT Fluid consistency: Thin  Diet effective now                    Inpatient Medications  Scheduled Meds:  carbamazepine  200 mg Oral BID   chlordiazePOXIDE  10 mg Oral TID   diclofenac Sodium  2 g Topical QID   diltiazem  360 mg Oral Daily   feeding supplement  237 mL Oral BID BM   fluticasone furoate-vilanterol  1 puff Inhalation Daily   And   umeclidinium bromide  1 puff Inhalation Daily   folic acid  1 mg Oral Daily   metoprolol tartrate  75 mg Oral BID   multivitamin with minerals  1 tablet Oral Daily   nicotine  21 mg Transdermal Daily   pantoprazole  40  mg Oral Daily   phenobarbital  64.8 mg Oral BID   [START ON 11/19/2022] thiamine (VITAMIN B1) injection  100 mg Intravenous Daily   Continuous Infusions:  thiamine (VITAMIN B1) injection 250 mg (11/15/22 1035)   PRN Meds:.acetaminophen **OR** acetaminophen, albuterol, diltiazem, haloperidol lactate, HYDROcodone-acetaminophen, LORazepam  Antibiotics  :    Anti-infectives (From admission, onward)     None         Objective:   Vitals:   11/15/22 0500 11/15/22 0510 11/15/22 0749 11/15/22 0921  BP: 131/87   (!) 120/95  Pulse: (!) 102   82  Resp: (!) 25 20  (!) 30  Temp: 99 F (37.2 C)   (!) 97.5 F (36.4 C)  TempSrc: Oral   Oral  SpO2: 100%  97% 98%  Weight:      Height:        Wt Readings from Last 3 Encounters:  11/11/22 91.2 kg  10/20/22 95 kg  10/06/22 96.6 kg     Intake/Output Summary (Last 24 hours) at 11/15/2022 1044 Last data filed at 11/15/2022 0924 Gross per 24 hour  Intake --  Output 350 ml  Net -350 ml     Physical Exam  Awake Alert, No new F.N deficits, calm but anxious to go home [place].AT,PERRAL Supple Neck, No JVD,   Symmetrical Chest wall movement, Good air movement bilaterally, CTAB iRRR,No Gallops, Rubs or new Murmurs,  +ve B.Sounds, Abd Soft, No tenderness,   No Cyanosis, Clubbing or edema       Data Review:    Recent Labs  Lab 11/10/22 1749 11/11/22 0114 11/11/22 0352 11/11/22 0630 11/12/22 0545 11/13/22 0304 11/15/22 0341  WBC 4.1  --  4.5 4.2 4.9 5.2 5.0  HGB 12.6*   < > 11.7* 11.6* 11.1* 11.5* 11.3*  HCT 38.0*   < > 34.1* 33.6* 33.3* 32.8* 34.6*  PLT 53*  --  52* 49* 49* 48* 81*  MCV 95.0  --  93.2 93.3 94.6 92.7 94.8  MCH 31.5  --  32.0 32.2 31.5 32.5 31.0  MCHC 33.2  --  34.3 34.5 33.3 35.1 32.7  RDW 15.4  --  15.4 15.5 15.4 15.7* 15.6*  LYMPHSABS 1.0  --   --   --  1.1 1.1  --   MONOABS 0.5  --   --   --  0.7 0.9  --   EOSABS 0.0  --   --   --  0.1 0.1  --   BASOSABS 0.0  --   --   --  0.0 0.0  --    < > = values in this interval not displayed.    Recent Labs  Lab 11/10/22 1749 11/11/22 0004 11/11/22 0114 11/11/22 0429 11/11/22 0630 11/12/22 0545 11/13/22 0304 11/14/22 0448 11/15/22 0341  NA 137  --    < >  --  136 134* 136 136 134*  K 4.2  --    < >  --  3.8 3.7 4.0 3.6 3.9  CL 101  --   --   --  103 101 102 103 101  CO2 22  --   --   --  '24 22 24 24 25  '$ ANIONGAP 14  --   --   --  '9 11 10 9 8   '$ GLUCOSE 107*  --   --   --  81 120* 101* 90 97  BUN 26*  --   --   --  '18 7 7 '$ 7  16  CREATININE 0.73  --   --   --  0.69 0.58* 0.60* 0.50* 0.81  AST 93*  --   --   --   --   --   --   --   --   ALT 48*  --   --   --   --   --   --   --   --   ALKPHOS 103  --   --   --   --   --   --   --   --   BILITOT 1.0  --   --   --   --   --   --   --   --   ALBUMIN 3.1*  --   --   --   --   --   --   --   --   LATICACIDVEN  --  0.9  --  0.8  --   --   --   --   --   INR  --  1.2  --   --   --   --   --   --   --   TSH  --  1.436  --   --   --   --   --   --   --   AMMONIA  --  39*  --   --   --   --   --   --   --   BNP  --   --   --   --  524.6* 838.6* 883.4*  --   --   MG  --  1.0*  --   --   --  1.4* 1.7 1.5* 1.7  CALCIUM 8.2*  --   --   --  7.6* 7.9* 8.6* 8.9 9.2   < > = values in this interval not displayed.       Radiology Reports ECHOCARDIOGRAM COMPLETE  Result Date: 11/11/2022    ECHOCARDIOGRAM REPORT   Patient Name:   Jose [name]. Date of Exam: 11/11/2022 Medical Rec #:  [id number]      Height:       70.5 in Accession #:    [id number]     Weight:       209.4 lb Date of Birth:  [birth year]       BSA:          2.140 m Patient Age:    6 years       BP:           126/99 mmHg Patient Gender: M              HR:           115 bpm. Exam Location:  Inpatient Procedure: 2D Echo, Cardiac Doppler and Color Doppler Indications:    I48.91* Unspeicified atrial fibrillation  History:        Patient has prior history of Echocardiogram examinations, most                 recent 11/05/2021. CHF; Risk Factors:Hypertension, Dyslipidemia                 and ETOH.  Sonographer:    Bernadene Person RDCS Referring Phys: Toy Baker IMPRESSIONS  1. LVEF challenging with afib/ PVC. Left ventricular ejection fraction, by estimation, is 50 to 55%. The left ventricle has low normal function. The left ventricle has no regional wall motion  abnormalities. There is mild left ventricular hypertrophy. Left ventricular diastolic  parameters are indeterminate.  2. Right ventricular systolic function is normal. The right ventricular size is normal. There is mildly elevated pulmonary artery systolic pressure.  3. Left atrial size was moderately dilated.  4. Right atrial size was moderately dilated.  5. Trivial mitral valve regurgitation.  6. The aortic valve is tricuspid. Aortic valve regurgitation is not visualized.  7. There is mild dilatation of the ascending aorta, measuring 39 mm.  8. The inferior vena cava is normal in size with greater than 50% respiratory variability, suggesting right atrial pressure of 3 mmHg. FINDINGS  Left Ventricle: LVEF challenging with afib/ PVC. Left ventricular ejection fraction, by estimation, is 50 to 55%. The left ventricle has low normal function. The left ventricle has no regional wall motion abnormalities. The left ventricular internal cavity size was normal in size. There is mild left ventricular hypertrophy. Left ventricular diastolic parameters are indeterminate. Right Ventricle: The right ventricular size is normal. Right ventricular systolic function is normal. There is mildly elevated pulmonary artery systolic pressure. The tricuspid regurgitant velocity is 2.87 m/s, and with an assumed right atrial pressure of 8 mmHg, the estimated right ventricular systolic pressure is 29.7 mmHg. Left Atrium: Left atrial size was moderately dilated. Right Atrium: Right atrial size was moderately dilated. Pericardium: There is no evidence of pericardial effusion. Mitral Valve: Trivial mitral valve regurgitation. Tricuspid Valve: Tricuspid valve regurgitation is mild. Aortic Valve: The aortic valve is tricuspid. Aortic valve regurgitation is not visualized. Aortic regurgitation PHT measures 388 msec. Pulmonic Valve: Pulmonic valve regurgitation is not visualized. Aorta: There is mild dilatation of the ascending aorta, measuring 39 mm. Venous: The inferior vena cava is normal in size with greater than 50% respiratory  variability, suggesting right atrial pressure of 3 mmHg. IAS/Shunts: The interatrial septum was not well visualized.  LEFT VENTRICLE PLAX 2D LVIDd:         5.00 cm LVIDs:         3.50 cm LV PW:         1.10 cm LV IVS:        1.00 cm LVOT diam:     2.50 cm LV SV:         53 LV SV Index:   25 LVOT Area:     4.91 cm  LV Volumes (MOD) LV vol d, MOD A2C: 135.0 ml LV vol d, MOD A4C: 136.0 ml LV vol s, MOD A2C: 79.7 ml LV vol s, MOD A4C: 69.7 ml LV SV MOD A2C:     55.3 ml LV SV MOD A4C:     136.0 ml LV SV MOD BP:      62.5 ml RIGHT VENTRICLE TAPSE (M-mode): 1.8 cm LEFT ATRIUM              Index        RIGHT ATRIUM           Index LA diam:        4.20 cm  1.96 cm/m   RA Area:     26.40 cm LA Vol (A2C):   93.7 ml  43.79 ml/m  RA Volume:   88.80 ml  41.50 ml/m LA Vol (A4C):   101.0 ml 47.20 ml/m LA Biplane Vol: 98.5 ml  46.03 ml/m  AORTIC VALVE LVOT Vmax:   67.42 cm/s LVOT Vmean:  45.950 cm/s LVOT VTI:    0.108 m AI PHT:      388 msec  AORTA  Ao Root diam: 4.40 cm Ao Asc diam:  3.90 cm TRICUSPID VALVE TR Peak grad:   32.9 mmHg TR Vmax:        287.00 cm/s  SHUNTS Systemic VTI:  0.11 m Systemic Diam: 2.50 cm Phineas Inches Electronically signed by Phineas Inches Signature Date/Time: 11/11/2022/11:18:12 AM    Final       Signature  -   Lala Lund M.D on 11/15/2022 at 10:44 AM   -  To page go to [email or web address]

## 2022-11-16 DIAGNOSIS — I482 Chronic atrial fibrillation, unspecified: Secondary | ICD-10-CM | POA: Diagnosis not present

## 2022-11-16 DIAGNOSIS — D696 Thrombocytopenia, unspecified: Secondary | ICD-10-CM | POA: Diagnosis not present

## 2022-11-16 DIAGNOSIS — I10 Essential (primary) hypertension: Secondary | ICD-10-CM | POA: Diagnosis not present

## 2022-11-16 DIAGNOSIS — I4891 Unspecified atrial fibrillation: Secondary | ICD-10-CM | POA: Diagnosis not present

## 2022-11-16 DIAGNOSIS — R569 Unspecified convulsions: Secondary | ICD-10-CM | POA: Diagnosis not present

## 2022-11-16 LAB — CBC WITH DIFFERENTIAL/PLATELET
Abs Immature Granulocytes: 0.01 10*3/uL (ref 0.00–0.07)
Basophils Absolute: 0 10*3/uL (ref 0.0–0.1)
Basophils Relative: 1 %
Eosinophils Absolute: 0.1 10*3/uL (ref 0.0–0.5)
Eosinophils Relative: 3 %
HCT: 33 % — ABNORMAL LOW (ref 39.0–52.0)
Hemoglobin: 11.2 g/dL — ABNORMAL LOW (ref 13.0–17.0)
Immature Granulocytes: 0 %
Lymphocytes Relative: 29 %
Lymphs Abs: 1.4 10*3/uL (ref 0.7–4.0)
MCH: 31.3 pg (ref 26.0–34.0)
MCHC: 33.9 g/dL (ref 30.0–36.0)
MCV: 92.2 fL (ref 80.0–100.0)
Monocytes Absolute: 0.8 10*3/uL (ref 0.1–1.0)
Monocytes Relative: 18 %
Neutro Abs: 2.3 10*3/uL (ref 1.7–7.7)
Neutrophils Relative %: 49 %
Platelets: 99 10*3/uL — ABNORMAL LOW (ref 150–400)
RBC: 3.58 MIL/uL — ABNORMAL LOW (ref 4.22–5.81)
RDW: 15.5 % (ref 11.5–15.5)
WBC: 4.6 10*3/uL (ref 4.0–10.5)
nRBC: 0 % (ref 0.0–0.2)

## 2022-11-16 LAB — BASIC METABOLIC PANEL
Anion gap: 10 (ref 5–15)
BUN: 22 mg/dL — ABNORMAL HIGH (ref 6–20)
CO2: 24 mmol/L (ref 22–32)
Calcium: 9.5 mg/dL (ref 8.9–10.3)
Chloride: 102 mmol/L (ref 98–111)
Creatinine, Ser: 0.75 mg/dL (ref 0.61–1.24)
GFR, Estimated: >60 mL/min (ref >60)
Glucose, Bld: 96 mg/dL (ref 70–99)
Potassium: 3.7 mmol/L (ref 3.5–5.1)
Sodium: 136 mmol/L (ref 135–145)

## 2022-11-16 LAB — BRAIN NATRIURETIC PEPTIDE: B Natriuretic Peptide: 1058.2 pg/mL — ABNORMAL HIGH (ref 0.0–100.0)

## 2022-11-16 LAB — MAGNESIUM: Magnesium: 1.5 mg/dL — ABNORMAL LOW (ref 1.7–2.4)

## 2022-11-16 MED ORDER — LORAZEPAM 1 MG PO TABS
1.0000 mg | ORAL_TABLET | ORAL | Status: DC | PRN
  Administered 2022-11-16 – 2022-11-17 (×4): 1 mg via ORAL
  Filled 2022-11-16 (×4): qty 1

## 2022-11-16 MED ORDER — CHLORDIAZEPOXIDE HCL 5 MG PO CAPS
5.0000 mg | ORAL_CAPSULE | Freq: Two times a day (BID) | ORAL | Status: DC
  Administered 2022-11-17 – 2022-11-18 (×3): 5 mg via ORAL
  Filled 2022-11-16 (×3): qty 1

## 2022-11-16 MED ORDER — THIAMINE MONONITRATE 100 MG PO TABS
100.0000 mg | ORAL_TABLET | Freq: Every day | ORAL | Status: DC
  Administered 2022-11-17 – 2022-11-18 (×2): 100 mg via ORAL
  Filled 2022-11-16 (×2): qty 1

## 2022-11-16 MED ORDER — SACUBITRIL-VALSARTAN 49-51 MG PO TABS
1.0000 | ORAL_TABLET | Freq: Two times a day (BID) | ORAL | Status: DC
  Administered 2022-11-16 – 2022-11-18 (×4): 1 via ORAL
  Filled 2022-11-16 (×5): qty 1

## 2022-11-16 NOTE — Assessment & Plan Note (Signed)
Likely due to alcohol abuse. Continue close follow up of cell count.  PLT today are 99

## 2022-11-16 NOTE — Assessment & Plan Note (Addendum)
Patient was placed back on entresto for blood pressure control with good toleration.  At the time of his discharge his systolic blood pressure is 129 to 114 mmHg. Continue with diltiazem and metoprolol for atrial fibrillation rate control.

## 2022-11-16 NOTE — Progress Notes (Signed)
Occupational Therapy Treatment Patient Details Name: Jose [name]. MRN: [id number] DOB: April 11, 1971 Today's Date: 11/16/2022   History of present illness Patient is a 52 yo male presenting to the ED with seizures, and hypotensive on 11/10/21.Found to have Afib with heparin administered on 11/11/21. PMH: alcohol abuse, alcohol-related seizure, hypertension, CHF, hyperlipidemia, tobacco use   OT comments  Patient continues to make steady progress towards goals in skilled OT session. Patient's session encompassed  on upper level cognition, attempted theraband exercises however patient too lethargic to attempt and with decreased attention throughout. Patient also noted to have Afib to 120 sitting in recliner (sustaining for less than 5 seconds). Patient's upper level cognition remains poor (cognition vs DTs) therefore goal made for medication management to promote safety to discharge home. OT will continue to follow.    Recommendations for follow up therapy are one component of a multi-disciplinary discharge planning process, led by the attending physician.  Recommendations may be updated based on patient status, additional functional criteria and insurance authorization.    Follow Up Recommendations  No OT follow up     Assistance Recommended at Discharge Intermittent Supervision/Assistance  Patient can return home with the following  A little help with walking and/or transfers;A little help with bathing/dressing/bathroom;Assistance with cooking/housework;Assist for transportation;Help with stairs or ramp for entrance   Equipment Recommendations  None recommended by OT    Recommendations for Other Services      Precautions / Restrictions Precautions Precautions: Other (comment) Precaution Comments: watch HR Restrictions Weight Bearing Restrictions: No       Mobility Bed Mobility Overal bed mobility: Independent                  Transfers Overall transfer level: Independent                  General transfer comment: up in chair upon arrival, declining further movement     Balance                                           ADL either performed or assessed with clinical judgement   ADL Overall ADL's : Needs assistance/impaired                                       General ADL Comments: Session focus on upper level cognition, attempted theraband exercises however patient too lethargic to attempt and with decreased attention throughout    Extremity/Trunk Assessment              Vision       Perception     Praxis      Cognition Arousal/Alertness: Lethargic Behavior During Therapy: WFL for tasks assessed/performed Overall Cognitive Status: Difficult to assess Area of Impairment: Attention, Memory, Safety/judgement, Following commands, Awareness, Problem solving                   Current Attention Level: Sustained Memory: Decreased short-term memory Following Commands: Follows one step commands with increased time, Follows multi-step commands inconsistently Safety/Judgement: Decreased awareness of safety, Decreased awareness of deficits Awareness: Emergent Problem Solving: Requires verbal cues, Difficulty sequencing, Decreased initiation, Slow processing General Comments: Patient oriented, but requiring increased time for all responses, perseverating on needing the IV, despite RN telling him different times that the IV  is only being used to administer some saline after his meds and he does not need all the saline        Exercises      Shoulder Instructions       General Comments      Pertinent Vitals/ Pain       Pain Assessment Pain Assessment: No/denies pain  Home Living                                          Prior Functioning/Environment              Frequency  Min 2X/week        Progress Toward Goals  OT Goals(current goals can now be found in the care  plan section)  Progress towards OT goals: Progressing toward goals  Acute Rehab OT Goals Patient Stated Goal: go home OT Goal Formulation: With patient Time For Goal Achievement: 11/26/22 Potential to Achieve Goals: Good  Plan Discharge plan remains appropriate    Co-evaluation                 AM-PAC OT "6 Clicks" Daily Activity     Outcome Measure   Help from another person eating meals?: None Help from another person taking care of personal grooming?: A Little Help from another person toileting, which includes using toliet, bedpan, or urinal?: A Little Help from another person bathing (including washing, rinsing, drying)?: A Little Help from another person to put on and taking off regular upper body clothing?: None Help from another person to put on and taking off regular lower body clothing?: A Little 6 Click Score: 20    End of Session    OT Visit Diagnosis: Unsteadiness on feet (R26.81);Muscle weakness (generalized) (M62.81)   Activity Tolerance Patient limited by fatigue;Patient limited by lethargy   Patient Left in chair;with call bell/phone within reach;with chair alarm set   Nurse Communication Mobility status        Time: 8832-5498 OT Time Calculation (min): 11 min  Charges: OT General Charges $OT Visit: 1 Visit OT Treatments $Self Care/Home Management : 8-22 mins  Corinne Ports E. Melessa Cowell, OTR/L Acute Rehabilitation Services [phone]   Ascencion Dike 11/16/2022, 9:47 AM

## 2022-11-16 NOTE — Progress Notes (Signed)
Patient ambulated with walker 164f around Nurse's station and back to room with standby assist

## 2022-11-16 NOTE — Plan of Care (Signed)
  Problem: Education: Goal: Expressions of having a comfortable level of knowledge regarding the disease process will increase Outcome: Progressing   Problem: Coping: Goal: Ability to adjust to condition or change in health will improve Outcome: Progressing Goal: Ability to identify appropriate support needs will improve Outcome: Progressing   Problem: Health Behavior/Discharge Planning: Goal: Compliance with prescribed medication regimen will improve Outcome: Progressing   Problem: Medication: Goal: Risk for medication side effects will decrease Outcome: Progressing   Problem: Clinical Measurements: Goal: Complications related to the disease process, condition or treatment will be avoided or minimized Outcome: Progressing Goal: Diagnostic test results will improve Outcome: Progressing   Problem: Safety: Goal: Verbalization of understanding the information provided will improve Outcome: Progressing   Problem: Self-Concept: Goal: Level of anxiety will decrease Outcome: Progressing Goal: Ability to verbalize feelings about condition will improve Outcome: Progressing   Problem: Education: Goal: Knowledge of General Education information will improve Description: Including pain rating scale, medication(s)/side effects and non-pharmacologic comfort measures Outcome: Progressing   Problem: Health Behavior/Discharge Planning: Goal: Ability to manage health-related needs will improve Outcome: Progressing   Problem: Clinical Measurements: Goal: Ability to maintain clinical measurements within normal limits will improve Outcome: Progressing Goal: Will remain free from infection Outcome: Progressing Goal: Diagnostic test results will improve Outcome: Progressing Goal: Respiratory complications will improve Outcome: Progressing Goal: Cardiovascular complication will be avoided Outcome: Progressing   Problem: Activity: Goal: Risk for activity intolerance will  decrease Outcome: Progressing   Problem: Nutrition: Goal: Adequate nutrition will be maintained Outcome: Progressing   Problem: Coping: Goal: Level of anxiety will decrease Outcome: Progressing   Problem: Elimination: Goal: Will not experience complications related to bowel motility Outcome: Progressing Goal: Will not experience complications related to urinary retention Outcome: Progressing   Problem: Pain Managment: Goal: General experience of comfort will improve Outcome: Progressing   Problem: Safety: Goal: Ability to remain free from injury will improve Outcome: Progressing   Problem: Skin Integrity: Goal: Risk for impaired skin integrity will decrease Outcome: Progressing   Problem: Safety: Goal: Non-violent Restraint(s) Outcome: Progressing

## 2022-11-16 NOTE — Progress Notes (Signed)
Progress Note  Patient Name: Jose [name]. Date of Encounter: 11/16/2022  Primary Cardiologist: Kowalski/Hochrein  Subjective   No complaints   Inpatient Medications    Scheduled Meds:  carbamazepine  200 mg Oral BID   chlordiazePOXIDE  10 mg Oral TID   diclofenac Sodium  2 g Topical QID   diltiazem  360 mg Oral Daily   feeding supplement  237 mL Oral BID BM   fluticasone furoate-vilanterol  1 puff Inhalation Daily   And   umeclidinium bromide  1 puff Inhalation Daily   folic acid  1 mg Oral Daily   metoprolol tartrate  75 mg Oral BID   multivitamin with minerals  1 tablet Oral Daily   nicotine  21 mg Transdermal Daily   pantoprazole  40 mg Oral Daily   phenobarbital  64.8 mg Oral BID   [START ON 11/19/2022] thiamine (VITAMIN B1) injection  100 mg Intravenous Daily   Continuous Infusions:  thiamine (VITAMIN B1) injection 250 mg (11/15/22 1035)   PRN Meds: acetaminophen **OR** acetaminophen, albuterol, diltiazem, haloperidol lactate, HYDROcodone-acetaminophen, LORazepam   Vital Signs    Vitals:   11/15/22 2052 11/15/22 2354 11/16/22 0357 11/16/22 0810  BP: (!) 132/98 (!) 129/94 116/82 (!) 138/110  Pulse: 92 93 71 73  Resp:  (!) '26 16 18  '$ Temp: 98.4 F (36.9 C) (!) 97.5 F (36.4 C) 97.8 F (36.6 C) 97.8 F (36.6 C)  TempSrc: Oral Oral Oral   SpO2: 96% 98% 97% 100%  Weight:      Height:        Intake/Output Summary (Last 24 hours) at 11/16/2022 2202 Last data filed at 11/16/2022 0500 Gross per 24 hour  Intake 50 ml  Output 600 ml  Net -550 ml    Filed Weights   11/10/22 2200 11/11/22 2155  Weight: 95 kg 91.2 kg    Telemetry     Personally reviewed, HR 120-140s  ECG    AFib  Physical Exam   Affect appropriate Chronically ill male  HEENT: normal Neck supple with no adenopathy JVP normal no bruits no thyromegaly Lungs clear with no wheezing and good diaphragmatic motion Heart:  S1/S2 no murmur, no rub, gallop or click PMI  normal Abdomen: benighn, BS positve, no tenderness, no AAA no bruit.  No HSM or HJR Distal pulses intact with no bruits No edema Neuro non-focal Skin warm and dry No muscular weakness   Labs    Chemistry Recent Labs  Lab 11/10/22 1749 11/11/22 0114 11/14/22 0448 11/15/22 0341 11/16/22 0321  NA 137   < > 136 134* 136  K 4.2   < > 3.6 3.9 3.7  CL 101   < > 103 101 102  CO2 22   < > '24 25 24  '$ GLUCOSE 107*   < > 90 97 96  BUN 26*   < > 7 16 22*  CREATININE 0.73   < > 0.50* 0.81 0.75  CALCIUM 8.2*   < > 8.9 9.2 9.5  PROT 5.8*  --   --   --   --   ALBUMIN 3.1*  --   --   --   --   AST 93*  --   --   --   --   ALT 48*  --   --   --   --   ALKPHOS 103  --   --   --   --   BILITOT 1.0  --   --   --   --  GFRNONAA >60   < > >60 >60 >60  ANIONGAP 14   < > '9 8 10   '$ < > = values in this interval not displayed.     Hematology Recent Labs  Lab 11/13/22 0304 11/15/22 0341 11/16/22 0321  WBC 5.2 5.0 4.6  RBC 3.54* 3.65* 3.58*  HGB 11.5* 11.3* 11.2*  HCT 32.8* 34.6* 33.0*  MCV 92.7 94.8 92.2  MCH 32.5 31.0 31.3  MCHC 35.1 32.7 33.9  RDW 15.7* 15.6* 15.5  PLT 48* 81* 99*    Cardiac Enzymes Recent Labs  Lab 11/11/22 0004 11/11/22 0352  TROPONINIHS 8 8    BNP Recent Labs  Lab 11/12/22 0545 11/13/22 0304 11/16/22 0321  BNP 838.6* 883.4* 1,058.2*     DDimerNo results for input(s): "DDIMER" in the last 168 hours.   Radiology    No results found.  Patient profile   Patient is a 52 year old M known to have HTN, HLD, nicotine abuse, COPD, alcohol abuse, history of seizure and chronic diastolic HFpEF is currently admitted to hospitalist team for the management of RSV infection and new occurrence of A-fib with RVR.  Assessment & Plan    # New onset of A-fib with RVR in the setting of RSV infection # Alcohol abuse and alcohol withdrawal seizure -no AC due to ETOH abuse and low PLT with fall risk  - change cardizem to 360 mg CD - Increase lopressor to 75 mg  bid  - Rates currently 90-100 bpm   # Chronic diastolic heart failure, BNP 524, compensated # RSV infection: Management per primary team  Ok to d/c home from our standpoint he follows with Dr Serafina Royals in [place] and [place], Jenkins Rouge, MD  11/16/2022, 9:27 AM

## 2022-11-16 NOTE — Progress Notes (Signed)
Progress Note   Patient: Jose [name]. ATF:[id number] DOB: 03-28-1971 DOA: 11/10/2022     5 DOS: the patient was seen and examined on 11/16/2022   Brief hospital course: Jose [name] was admitted to the hospital with the working diagnosis of alcohol withdrawal with seizures.   52 yo male with the past medical history of seizures, diastolic heart failure, COPD, alcohol and tobacco abuse who presented with a generalized seizure. Apparently non compliant with his medications. He was found down and transported to the ED. On his initial physical examination his blood pressure was 120/80, HR 120, RR 20 and 02 saturation 95%, he was alert and awake, heart with S1 and S2 present and rhythmic, respiratory with no wheezing, abdomen with no distention and no lower extremity edema, neurologically non focal.   Head CT with no acute changes.   He was placed on alcohol withdrawal protocol.  He developed atrial fibrillation with RVR, required diltiazem infusion.  No anticoagulation due to bleeding risk.   Patient has been transitioned to oral AV blockers with good toleration.   Assessment and Plan: * Seizures (Jose [name]) Patient with no further seizures.  EEG with no seizure activity.  Patient has been placed on tegetrol per neurology recommendations.   Alcohol withdrawal syndrome. / acute metabolic encephalopathy.  Patient is seems sedated today, will hold on phenobarbital and will change lorazepam to 1 mg as needed.  Decrease chlordiazepoide to bid  Continue neuro checks.  Corrected hypomagnesemia.  Hypertension Blood pressure has been stable, continue with diltiazem with good toleration,.  Uncontrolled hypertension, resume entresto, continue to hold on hydralazine for now.   COPD (chronic obstructive pulmonary disease) (HCC) No clinical signs of exacerbation Smoking cessation counseling.   Chronic heart failure with preserved ejection fraction (HCC) No clinical signs of exacerbation Continue blood  pressure control and diltiazem for atrial fibrillation.   Thrombocytopenia ([place]) Likely due to alcohol abuse. Continue close follow up of cell count.  PLT today are 99   Atrial fibrillation with RVR (HCC) Continue rate control with diltiazem Patient with medical non compliance, seizures, and alcohol abuse. High risk of bleeding, will hold on anticoagulation for now.   RSV (acute bronchiolitis due to respiratory syncytial virus) No signs of viral pneumonia.        Subjective: Patient with no chest pain or dyspnea, no tremors,  Physical Exam: Vitals:   11/16/22 0810 11/16/22 0957 11/16/22 1112 11/16/22 1524  BP: (!) 138/110  (!) 143/108 (!) 138/102  Pulse: 73  (!) 109 88  Resp: '18  18 18  '$ Temp: 97.8 F (36.6 C)  98.6 F (37 C) 98.9 F (37.2 C)  TempSrc:      SpO2: 100% 98% 98% 99%  Weight:      Height:       Neurology awake and alert, slow to respond to questions, his wife at the bedside confirms patient not at his baseline  ENT with mild pallor Cardiovascular with S1 and S2 present with no gallops Respiratory with no rales or wheezing Abdomen with no distention  No lower extremity edema  Data Reviewed:    Family Communication: I spoke with patient's wife  at the bedside, we talked in detail about patient's condition, plan of care and prognosis and all questions were addressed.   Disposition: Status is: Inpatient Remains inpatient appropriate because: alcohol withdrawal   Planned Discharge Destination: Home      Author: Tawni Millers, MD 11/16/2022 5:52 PM  For on call review [email or web address].

## 2022-11-16 NOTE — Assessment & Plan Note (Signed)
No signs of viral pneumonia.

## 2022-11-16 NOTE — Hospital Course (Addendum)
Jose [name] was admitted to the hospital with the working diagnosis of alcohol withdrawal with seizures.   52 yo male with the past medical history of seizures, diastolic heart failure, COPD, alcohol and tobacco abuse who presented with a generalized seizure. Apparently non compliant with his medications. He was found down and transported to the ED. On his initial physical examination his blood pressure was 120/80, HR 120, RR 20 and 02 saturation 95%, he was alert and awake, heart with S1 and S2 present and rhythmic, respiratory with no wheezing, abdomen with no distention and no lower extremity edema, neurologically non focal.   Na 137, K 4,2 CL 101, bicarbonate 22, glucose 107, bun 26 cr 0,73  Mg 1.0 AST 93, ALT 48, ammonia 38  High sensitive troponin 8 and 8  Wbc 4.1 hgb 12,6 plt 53  Carbamazepine level <2.0   RSV positive   Urine analysis SG 1,015, 30 protein, no leukocytes.  Alcohol level <10 Toxicology screen negative.   Head and cervical spine CT with no acute changes.  Mild generalized cerebral atrophy and chronic microvascular ischemic changes of the white matter.   Lumbar spine radiograph with no acute changes.  Right knee radiograph with no acute changes.   Chest radiograph with cardiomegaly, bilateral hilar vascular congestion with no effusions.   EKG 113 bpm, right axis deviation, qtc not prolonged, atrial fibrillation rhythm, no significant ST segment or T wave changes.   He was placed on alcohol withdrawal protocol.  He developed atrial fibrillation with RVR, required diltiazem infusion.  No anticoagulation due to bleeding risk.   Patient has been transitioned to oral AV blockers with good toleration.   01/11 mentation improving, now off phenobarbital and reduced dose of chlordiazepoxide.   01/12 patient back to baseline, he has been advised to avoid alcohol and follow seizure precautions.  Follow up with primary care as outpatient.

## 2022-11-16 NOTE — Assessment & Plan Note (Addendum)
Patient with no further seizures.  EEG with no seizure activity.  Patient has been placed on tegetrol per neurology recommendations.  He had Keppra bridge for 3 days, currently discontinued.  Seizure precautions.   Alcohol withdrawal syndrome. / acute metabolic encephalopathy.  Mentation has improved, patient was treated with lorazepam, chlordiazepoxide and phenobarbital.  Long acting sedatives were weaned off with good toleration. Patient will continue with as needed lorazepam at home.  He will follow up with outpatient support groups for alcohol abuse.   Patient will need to follow seizure precautions as instructed with discharge instruction section.

## 2022-11-16 NOTE — Assessment & Plan Note (Addendum)
Pulse rate 60 to 80, continue in atrial fibrillation rhythm.  Continue rate control with diltiazem and metoprolol.   Patient with medical non compliance, seizures, and alcohol abuse. High risk of bleeding, will hold on anticoagulation for now.

## 2022-11-16 NOTE — Assessment & Plan Note (Signed)
No clinical signs of exacerbation Smoking cessation counseling.

## 2022-11-16 NOTE — Assessment & Plan Note (Signed)
No clinical signs of exacerbation Continue blood pressure control and diltiazem for atrial fibrillation.

## 2022-11-17 DIAGNOSIS — R569 Unspecified convulsions: Secondary | ICD-10-CM | POA: Diagnosis not present

## 2022-11-17 DIAGNOSIS — I4891 Unspecified atrial fibrillation: Secondary | ICD-10-CM | POA: Diagnosis not present

## 2022-11-17 DIAGNOSIS — D696 Thrombocytopenia, unspecified: Secondary | ICD-10-CM | POA: Diagnosis not present

## 2022-11-17 DIAGNOSIS — I10 Essential (primary) hypertension: Secondary | ICD-10-CM | POA: Diagnosis not present

## 2022-11-17 LAB — BASIC METABOLIC PANEL
Anion gap: 10 (ref 5–15)
BUN: 25 mg/dL — ABNORMAL HIGH (ref 6–20)
CO2: 24 mmol/L (ref 22–32)
Calcium: 9.6 mg/dL (ref 8.9–10.3)
Chloride: 103 mmol/L (ref 98–111)
Creatinine, Ser: 0.67 mg/dL (ref 0.61–1.24)
GFR, Estimated: >60 mL/min (ref >60)
Glucose, Bld: 98 mg/dL (ref 70–99)
Potassium: 4.2 mmol/L (ref 3.5–5.1)
Sodium: 137 mmol/L (ref 135–145)

## 2022-11-17 MED ORDER — POLYVINYL ALCOHOL 1.4 % OP SOLN
1.0000 [drp] | OPHTHALMIC | Status: DC | PRN
  Administered 2022-11-17: 1 [drp] via OPHTHALMIC
  Filled 2022-11-17: qty 15

## 2022-11-17 MED ORDER — NICOTINE 7 MG/24HR TD PT24: 7.0000 mg | MEDICATED_PATCH | Freq: Every day | TRANSDERMAL | Status: DC

## 2022-11-17 MED ORDER — MAGNESIUM SULFATE 4 GM/100ML IV SOLN
4.0000 g | Freq: Once | INTRAVENOUS | Status: AC
  Administered 2022-11-17: 4 g via INTRAVENOUS
  Filled 2022-11-17: qty 100

## 2022-11-17 MED ORDER — NICOTINE POLACRILEX 2 MG MT GUM
2.0000 mg | CHEWING_GUM | OROMUCOSAL | Status: DC | PRN
  Administered 2022-11-17 (×2): 2 mg via ORAL
  Filled 2022-11-17 (×5): qty 1

## 2022-11-17 NOTE — Assessment & Plan Note (Addendum)
Discharge renal function is stable with serum cr at 0,67. K is 4,2 and serum bicarbonate is 24.  Mg is 1,6 Patient will continue oral mag oxide for the next 5 days and plan to follow up electrolytes as outpatient.

## 2022-11-17 NOTE — Progress Notes (Signed)
Collie Siad, patient's mother requests call when patient discharged, can provide transportation after 3:30pm 1/12. Phone number is [phone]

## 2022-11-17 NOTE — Progress Notes (Addendum)
Progress Note   Patient: Jose [name]. HGD:[id number] DOB: 09/25/1971 DOA: 11/10/2022     6 DOS: the patient was seen and examined on 11/17/2022   Brief hospital course: Jose [name] was admitted to the hospital with the working diagnosis of alcohol withdrawal with seizures.   52 yo male with the past medical history of seizures, diastolic heart failure, COPD, alcohol and tobacco abuse who presented with a generalized seizure. Apparently non compliant with his medications. He was found down and transported to the ED. On his initial physical examination his blood pressure was 120/80, HR 120, RR 20 and 02 saturation 95%, he was alert and awake, heart with S1 and S2 present and rhythmic, respiratory with no wheezing, abdomen with no distention and no lower extremity edema, neurologically non focal.   Head CT with no acute changes.   He was placed on alcohol withdrawal protocol.  He developed atrial fibrillation with RVR, required diltiazem infusion.  No anticoagulation due to bleeding risk.   Patient has been transitioned to oral AV blockers with good toleration.   01/11 mentation improving, now off phenobarbital and reduced dose of chlordiazepoxide.   Assessment and Plan: * Seizures (Jose [name]) Patient with no further seizures.  EEG with no seizure activity.  Patient has been placed on tegetrol per neurology recommendations.  He had Keppra bridge for 3 days, currently discontinued.  Seizure precautions.   Alcohol withdrawal syndrome. / acute metabolic encephalopathy.  Mentation has improved, plan to continue with chlordiazepoxide bid and as needed lorazepam ( has 2 doses over last 12 hrs)  Oral thiamine.  Out of bed to chair and ambulate in the hallway.  Possible discharge home tomorrow.   Corrected hypomagnesemia.  Hypertension Blood pressure this morning has improved, with systolic 962 to 229 mmHg.  Continue with Entresto.  Diltiazem for atrial fibrillation rate control.   COPD (chronic  obstructive pulmonary disease) (HCC) No clinical signs of exacerbation Smoking cessation counseling.   Chronic heart failure with preserved ejection fraction (HCC) Echocardiogram with preserved LV systolic function Ef 50 to 55%, mild LVH. RV systolic function is preserved. RA and LA with moderate dilatation. No significant valvular disease.   No clinical signs of exacerbation Resumed entresto with good toleration.   Thrombocytopenia ([place]) Likely due to alcohol abuse. Continue close follow up of cell count.  PLT are 99   Atrial fibrillation with RVR (HCC) Pulse rate 60 to 80, continue in atrial fibrillation rhythm.  Continue rate control with diltiazem Patient with medical non compliance, seizures, and alcohol abuse. High risk of bleeding, will hold on anticoagulation for now.  Ok to discontinue telemetry  RSV (acute bronchiolitis due to respiratory syncytial virus) No signs of viral pneumonia.  Hypomagnesemia Mg 1.5 yesterday.  Plan to add 4 g Mg sulfate IV and follow up Mg in am.         Subjective: Patient is more awake and reactive, no chest pain or dyspnea, no nausea or vomiting, no tremors or anxiety.   Physical Exam: Vitals:   11/16/22 1524 11/16/22 2053 11/16/22 2306 11/17/22 0338  BP: (!) 138/102 (!) 130/90 119/86 116/88  Pulse: 88 100 (!) 53 68  Resp: '18 20 18 17  '$ Temp: 98.9 F (37.2 C) 98.1 F (36.7 C) 98.4 F (36.9 C) 98 F (36.7 C)  TempSrc:  Oral Oral   SpO2: 99% 99% 94% 99%  Weight:      Height:       Neurology awake and alert, mild tremors, no  agitation or confusion, follows commands and responds to questions appropriately.  ENT with mild pallor Cardiovascular with S1 and S2 present, irregularly irregular with no gallops, rubs or murmurs Respiratory with no rales or wheezing, no rhonchi  Abdomen with no distention  No lower extremity edema  Data Reviewed:    Family Communication: I spoke with patient's mother over the phone, we talked in  detail about patient's condition, plan of care and prognosis and all questions were addressed.   Disposition: Status is: Inpatient Remains inpatient appropriate because: continue neurologic monitoring, possible discharge home tomorrow.   Planned Discharge Destination: Home  Author: Tawni Millers, MD 11/17/2022 10:54 AM  For on call review [email or web address].

## 2022-11-18 ENCOUNTER — Other Ambulatory Visit (HOSPITAL_COMMUNITY): Payer: Self-pay

## 2022-11-18 DIAGNOSIS — I10 Essential (primary) hypertension: Secondary | ICD-10-CM | POA: Diagnosis not present

## 2022-11-18 DIAGNOSIS — R569 Unspecified convulsions: Secondary | ICD-10-CM | POA: Diagnosis not present

## 2022-11-18 DIAGNOSIS — I4891 Unspecified atrial fibrillation: Secondary | ICD-10-CM | POA: Diagnosis not present

## 2022-11-18 DIAGNOSIS — D696 Thrombocytopenia, unspecified: Secondary | ICD-10-CM | POA: Diagnosis not present

## 2022-11-18 LAB — MAGNESIUM: Magnesium: 1.6 mg/dL — ABNORMAL LOW (ref 1.7–2.4)

## 2022-11-18 MED ORDER — LORAZEPAM 1 MG PO TABS
1.0000 mg | ORAL_TABLET | Freq: Four times a day (QID) | ORAL | 0 refills | Status: DC | PRN
  Filled 2022-11-18: qty 10, 3d supply, fill #0

## 2022-11-18 MED ORDER — ROSUVASTATIN CALCIUM 20 MG PO TABS
20.0000 mg | ORAL_TABLET | Freq: Every day | ORAL | 0 refills | Status: DC
  Filled 2022-11-18: qty 30, 30d supply, fill #0

## 2022-11-18 MED ORDER — DILTIAZEM HCL ER COATED BEADS 360 MG PO CP24
360.0000 mg | ORAL_CAPSULE | Freq: Every day | ORAL | 0 refills | Status: DC
  Filled 2022-11-18: qty 20, 20d supply, fill #0

## 2022-11-18 MED ORDER — METOPROLOL TARTRATE 25 MG PO TABS
75.0000 mg | ORAL_TABLET | Freq: Two times a day (BID) | ORAL | 0 refills | Status: DC
  Filled 2022-11-18: qty 180, 30d supply, fill #0

## 2022-11-18 MED ORDER — MAGNESIUM OXIDE 400 MG PO TABS
400.0000 mg | ORAL_TABLET | Freq: Two times a day (BID) | ORAL | 0 refills | Status: AC
  Filled 2022-11-18: qty 10, 5d supply, fill #0

## 2022-11-18 MED ORDER — MAGNESIUM SULFATE 4 GM/100ML IV SOLN
4.0000 g | Freq: Once | INTRAVENOUS | Status: DC
  Filled 2022-11-18: qty 100

## 2022-11-18 MED ORDER — MAGNESIUM OXIDE -MG SUPPLEMENT 400 (240 MG) MG PO TABS
400.0000 mg | ORAL_TABLET | Freq: Two times a day (BID) | ORAL | Status: DC
  Administered 2022-11-18: 400 mg via ORAL
  Filled 2022-11-18: qty 1

## 2022-11-18 MED ORDER — SACUBITRIL-VALSARTAN 49-51 MG PO TABS
1.0000 | ORAL_TABLET | Freq: Two times a day (BID) | ORAL | 0 refills | Status: DC
  Filled 2022-11-18: qty 60, 30d supply, fill #0

## 2022-11-18 NOTE — Discharge Summary (Addendum)
Physician Discharge Summary   Patient: Jose [name]. MRN: [id number] DOB: Sep 09, 1971  Admit date:     11/10/2022  Discharge date: 11/18/22  Discharge Physician: Jimmy Picket Eniola Cerullo   PCP: Jon Billings, NP   Recommendations at discharge:    Patient will continue carbamazepine 200 mg bid, follow up carbamazepine level in 7 days, diltiazem can increase medication levels.  Added mag oxide for 5 days, follow up electrolytes in 7 days as outpatient.  Changed carvedilol to metoprolol. No anticoagulation for atrial fibrillation due to bleeding risk in the setting of alcohol abuse and seizures.  Placed on as needed lorazepam for anxiety.  Advised to avoid alcohol and follow up with outpatient support groups.  Holding amlodipine and hydralazine to prevent hypotension.  Seizure precautions.  Follow up with Jon Billings NP as scheduled.  Patient will need outpatient re evaluation before he can return to work.   I spoke with patient's mother at the bedside, we talked in detail about patient's condition, plan of care and prognosis and all questions were addressed.   Discharge Diagnoses: Principal Problem:   Seizures ([place]) Active Problems:   Hypertension   COPD (chronic obstructive pulmonary disease) (HCC)   Chronic heart failure with preserved ejection fraction (HCC)   Thrombocytopenia (HCC)   Atrial fibrillation with RVR (HCC)   RSV (acute bronchiolitis due to respiratory syncytial virus)   Hypomagnesemia  Resolved Problems:   * No resolved hospital problems. [facility] Course: Jose [name] was admitted to the hospital with the working diagnosis of alcohol withdrawal with seizures.   52 yo male with the past medical history of seizures, diastolic heart failure, COPD, alcohol and tobacco abuse who presented with a generalized seizure. Apparently non compliant with his medications. He was found down and transported to the ED. On his initial physical examination his blood pressure  was 120/80, HR 120, RR 20 and 02 saturation 95%, he was alert and awake, heart with S1 and S2 present and rhythmic, respiratory with no wheezing, abdomen with no distention and no lower extremity edema, neurologically non focal.   Na 137, K 4,2 CL 101, bicarbonate 22, glucose 107, bun 26 cr 0,73  Mg 1.0 AST 93, ALT 48, ammonia 38  High sensitive troponin 8 and 8  Wbc 4.1 hgb 12,6 plt 53  Carbamazepine level <2.0   RSV positive   Urine analysis SG 1,015, 30 protein, no leukocytes.  Alcohol level <10 Toxicology screen negative.   Head and cervical spine CT with no acute changes.  Mild generalized cerebral atrophy and chronic microvascular ischemic changes of the white matter.   Lumbar spine radiograph with no acute changes.  Right knee radiograph with no acute changes.   Chest radiograph with cardiomegaly, bilateral hilar vascular congestion with no effusions.   EKG 113 bpm, right axis deviation, qtc not prolonged, atrial fibrillation rhythm, no significant ST segment or T wave changes.   He was placed on alcohol withdrawal protocol.  He developed atrial fibrillation with RVR, required diltiazem infusion.  No anticoagulation due to bleeding risk.   Patient has been transitioned to oral AV blockers with good toleration.   01/11 mentation improving, now off phenobarbital and reduced dose of chlordiazepoxide.   01/12 patient back to baseline, he has been advised to avoid alcohol and follow seizure precautions.  Follow up with primary care as outpatient.   Assessment and Plan: * Seizures ([place]) Patient with no further seizures.  EEG with no seizure activity.  Patient has been  placed on tegetrol per neurology recommendations.  He had Keppra bridge for 3 days, currently discontinued.  Seizure precautions.   Alcohol withdrawal syndrome. / acute metabolic encephalopathy.  Mentation has improved, patient was treated with lorazepam, chlordiazepoxide and phenobarbital.  Long acting  sedatives were weaned off with good toleration. Patient will continue with as needed lorazepam at home.  He will follow up with outpatient support groups for alcohol abuse.   Patient will need to follow seizure precautions as instructed with discharge instruction section.   Hypertension Patient was placed back on entresto for blood pressure control with good toleration.  At the time of his discharge his systolic blood pressure is 129 to 114 mmHg. Continue with diltiazem and metoprolol for atrial fibrillation rate control.    COPD (chronic obstructive pulmonary disease) (HCC) No clinical signs of exacerbation Smoking cessation counseling.   Chronic heart failure with preserved ejection fraction (HCC) Echocardiogram with preserved LV systolic function Ef 50 to 55%, mild LVH. RV systolic function is preserved. RA and LA with moderate dilatation. No significant valvular disease.   No clinical signs of exacerbation Resumed entresto with good toleration.   Thrombocytopenia ([place]) Likely due to alcohol abuse. Continue close follow up of cell count.  PLT are 99   Atrial fibrillation with RVR (HCC) Pulse rate 60 to 80, continue in atrial fibrillation rhythm.  Continue rate control with diltiazem and metoprolol.   Patient with medical non compliance, seizures, and alcohol abuse. High risk of bleeding, will hold on anticoagulation for now.    RSV (acute bronchiolitis due to respiratory syncytial virus) No signs of viral pneumonia.  Hypomagnesemia Discharge renal function is stable with serum cr at 0,67. K is 4,2 and serum bicarbonate is 24.  Mg is 1,6 Patient will continue oral mag oxide for the next 5 days and plan to follow up electrolytes as outpatient.          Consultants: neurology, cardiology  Procedures performed: none   Disposition: Home Diet recommendation:  Discharge Diet Orders (From admission, onward)     Start     Ordered   11/18/22 0000  Diet - low sodium  heart healthy        11/18/22 1118           Cardiac diet DISCHARGE MEDICATION: Allergies as of 11/18/2022   No Known Allergies      Medication List     STOP taking these medications    amLODipine 10 MG tablet Commonly known as: NORVASC   carvedilol 25 MG tablet Commonly known as: COREG   hydrALAZINE 50 MG tablet Commonly known as: APRESOLINE   isosorbide mononitrate 30 MG 24 hr tablet Commonly known as: IMDUR       TAKE these medications    albuterol 108 (90 Base) MCG/ACT inhaler Commonly known as: VENTOLIN HFA Inhale 2 puffs into the lungs every 6 (six) hours as needed for wheezing or shortness of breath.   carbamazepine 200 MG tablet Commonly known as: TEGRETOL Take 1 tablet (200 mg total) by mouth 2 (two) times daily.   diltiazem 360 MG 24 hr capsule Commonly known as: CARDIZEM CD Take 1 capsule (360 mg total) by mouth daily. Start taking on: November 19, 2022   empagliflozin 10 MG Tabs tablet Commonly known as: Jardiance Take 1 tablet (10 mg total) by mouth daily before breakfast.   furosemide 40 MG tablet Commonly known as: LASIX Take 1 tablet (40 mg total) by mouth daily.   Iron (Ferrous Sulfate) 325 (  65 Fe) MG Tabs Take 325 mg by mouth daily.   LORazepam 1 MG tablet Commonly known as: ATIVAN Take 1 tablet (1 mg total) by mouth every 6 (six) hours as needed for anxiety.   magnesium oxide 400 (240 Mg) MG tablet Commonly known as: MAG-OX Take 1 tablet (400 mg total) by mouth 2 (two) times daily for 5 days.   Metoprolol Tartrate 75 MG Tabs Take 75 mg by mouth 2 (two) times daily.   potassium chloride 10 MEQ CR capsule Commonly known as: MICRO-K Take 1 capsule (10 mEq total) by mouth daily.   rosuvastatin 20 MG tablet Commonly known as: Crestor Take 1 tablet (20 mg total) by mouth daily.   sacubitril-valsartan 49-51 MG Commonly known as: ENTRESTO Take 1 tablet by mouth 2 (two) times daily.   Trelegy Ellipta 100-62.5-25 MCG/ACT  Aepb Generic drug: Fluticasone-Umeclidin-Vilant Inhale 1 puff into the lungs daily.        Follow-up Information     Jon Billings, NP. Schedule an appointment as soon as possible for a visit in 1 week(s).   Specialty: Nurse Practitioner Contact information: [place] [address] [phone]                Discharge Exam: Danley Danker Weights   11/10/22 2200 11/11/22 2155  Weight: 95 kg 91.2 kg   BP 129/86 (BP Location: Right Arm)   Pulse 82   Temp 98.4 F (36.9 C) (Oral)   Resp 20   Ht '5\' 10"'$  (1.778 m)   Wt 91.2 kg   SpO2 95%   BMI 28.85 kg/m   Patient is awake and alert, no chest pain or dyspnea, no tremors or agitation   Neurology awake and alert, back to his baseline ENT with mild pallor Cardiovascular with S1 and S2 present, irregularly irregular with no gallops Respiratory with no rales or wheezing Abdomen with no distention  No lower extremity edema   Condition at discharge: stable  The results of significant diagnostics from this hospitalization (including imaging, microbiology, ancillary and laboratory) are listed below for reference.   Imaging Studies: ECHOCARDIOGRAM COMPLETE  Result Date: 11/11/2022    ECHOCARDIOGRAM REPORT   Patient Name:   Harkirat Orozco. Date of Exam: 11/11/2022 Medical Rec #:  [id number]      Height:       70.5 in Accession #:    [id number]     Weight:       209.4 lb Date of Birth:  1971-05-02       BSA:          2.140 m Patient Age:    67 years       BP:           126/99 mmHg Patient Gender: M              HR:           115 bpm. Exam Location:  Inpatient Procedure: 2D Echo, Cardiac Doppler and Color Doppler Indications:    I48.91* Unspeicified atrial fibrillation  History:        Patient has prior history of Echocardiogram examinations, most                 recent 11/05/2021. CHF; Risk Factors:Hypertension, Dyslipidemia                 and ETOH.  Sonographer:    Bernadene Person RDCS Referring Phys: Toy Baker  IMPRESSIONS  1. LVEF challenging with afib/  PVC. Left ventricular ejection fraction, by estimation, is 50 to 55%. The left ventricle has low normal function. The left ventricle has no regional wall motion abnormalities. There is mild left ventricular hypertrophy. Left ventricular diastolic parameters are indeterminate.  2. Right ventricular systolic function is normal. The right ventricular size is normal. There is mildly elevated pulmonary artery systolic pressure.  3. Left atrial size was moderately dilated.  4. Right atrial size was moderately dilated.  5. Trivial mitral valve regurgitation.  6. The aortic valve is tricuspid. Aortic valve regurgitation is not visualized.  7. There is mild dilatation of the ascending aorta, measuring 39 mm.  8. The inferior vena cava is normal in size with greater than 50% respiratory variability, suggesting right atrial pressure of 3 mmHg. FINDINGS  Left Ventricle: LVEF challenging with afib/ PVC. Left ventricular ejection fraction, by estimation, is 50 to 55%. The left ventricle has low normal function. The left ventricle has no regional wall motion abnormalities. The left ventricular internal cavity size was normal in size. There is mild left ventricular hypertrophy. Left ventricular diastolic parameters are indeterminate. Right Ventricle: The right ventricular size is normal. Right ventricular systolic function is normal. There is mildly elevated pulmonary artery systolic pressure. The tricuspid regurgitant velocity is 2.87 m/s, and with an assumed right atrial pressure of 8 mmHg, the estimated right ventricular systolic pressure is 88.4 mmHg. Left Atrium: Left atrial size was moderately dilated. Right Atrium: Right atrial size was moderately dilated. Pericardium: There is no evidence of pericardial effusion. Mitral Valve: Trivial mitral valve regurgitation. Tricuspid Valve: Tricuspid valve regurgitation is mild. Aortic Valve: The aortic valve is tricuspid. Aortic valve  regurgitation is not visualized. Aortic regurgitation PHT measures 388 msec. Pulmonic Valve: Pulmonic valve regurgitation is not visualized. Aorta: There is mild dilatation of the ascending aorta, measuring 39 mm. Venous: The inferior vena cava is normal in size with greater than 50% respiratory variability, suggesting right atrial pressure of 3 mmHg. IAS/Shunts: The interatrial septum was not well visualized.  LEFT VENTRICLE PLAX 2D LVIDd:         5.00 cm LVIDs:         3.50 cm LV PW:         1.10 cm LV IVS:        1.00 cm LVOT diam:     2.50 cm LV SV:         53 LV SV Index:   25 LVOT Area:     4.91 cm  LV Volumes (MOD) LV vol d, MOD A2C: 135.0 ml LV vol d, MOD A4C: 136.0 ml LV vol s, MOD A2C: 79.7 ml LV vol s, MOD A4C: 69.7 ml LV SV MOD A2C:     55.3 ml LV SV MOD A4C:     136.0 ml LV SV MOD BP:      62.5 ml RIGHT VENTRICLE TAPSE (M-mode): 1.8 cm LEFT ATRIUM              Index        RIGHT ATRIUM           Index LA diam:        4.20 cm  1.96 cm/m   RA Area:     26.40 cm LA Vol (A2C):   93.7 ml  43.79 ml/m  RA Volume:   88.80 ml  41.50 ml/m LA Vol (A4C):   101.0 ml 47.20 ml/m LA Biplane Vol: 98.5 ml  46.03 ml/m  AORTIC VALVE LVOT Vmax:  67.42 cm/s LVOT Vmean:  45.950 cm/s LVOT VTI:    0.108 m AI PHT:      388 msec  AORTA Ao Root diam: 4.40 cm Ao Asc diam:  3.90 cm TRICUSPID VALVE TR Peak grad:   32.9 mmHg TR Vmax:        287.00 cm/s  SHUNTS Systemic VTI:  0.11 m Systemic Diam: 2.50 cm Phineas Inches Electronically signed by Phineas Inches Signature Date/Time: 11/11/2022/11:18:12 AM    Final    EEG adult  Result Date: 11/11/2022 Lora Havens, MD     11/11/2022  8:36 AM Patient Name: [name] MRN: [id number] Epilepsy Attending: Lora Havens Referring Physician/Provider: Toy Baker, MD Date: 11/11/2022 Duration: 22.04 mins Patient history: 52yo M with h/o epilepsy came with seizure. EEG to evaluate for seizure. Level of alertness: Awake, asleep AEDs during EEG study: CBZ, LEV Technical aspects:  This EEG study was done with scalp electrodes positioned according to the 10-20 International system of electrode placement. Electrical activity was reviewed with band pass filter of 1-'70Hz'$ , sensitivity of 7 uV/mm, display speed of 79m/sec with a '60Hz'$  notched filter applied as appropriate. EEG data were recorded continuously and digitally stored.  Video monitoring was available and reviewed as appropriate. Description: The posterior dominant rhythm consists of 9-10 Hz activity of moderate voltage (25-35 uV) seen predominantly in posterior head regions, symmetric and reactive to eye opening and eye closing. Sleep was characterized by vertex waves, sleep spindles (12 to 14 Hz), maximal frontocentral region.  Physiologic photic driving was not seen during photic stimulation.  Hyperventilation was not performed.   IMPRESSION: This study is within normal limits. No seizures or epileptiform discharges were seen throughout the recording. A normal interictal EEG does not exclude the diagnosis of epilepsy. Priyanka OBarbra Sarks  [place]domen Limited RUQ (LIVER/GB)  Result Date: 11/11/2022 CLINICAL DATA:  Elevated liver function tests. EXAM: ULTRASOUND ABDOMEN LIMITED RIGHT UPPER QUADRANT COMPARISON:  None Available. FINDINGS: Gallbladder: No gallstones or wall thickening visualized (2.4 mm). No sonographic Murphy sign noted by sonographer. Common bile duct: Diameter: 2.8 mm Liver: No focal lesion identified. Diffusely increased echogenicity of the liver parenchyma is noted. Portal vein is patent on color Doppler imaging with normal direction of blood flow towards the liver. Other: None. IMPRESSION: Hepatic steatosis without focal liver lesions. Electronically Signed   By: TVirgina NorfolkM.D.   On: 11/11/2022 02:14   CT HEAD WO CONTRAST (5MM)  Result Date: 11/11/2022 CLINICAL DATA:  Status post seizure. EXAM: CT HEAD WITHOUT CONTRAST TECHNIQUE: Contiguous axial images were obtained from the base of the skull through the  vertex without intravenous contrast. RADIATION DOSE REDUCTION: This exam was performed according to the departmental dose-optimization program which includes automated exposure control, adjustment of the mA and/or kV according to patient size and/or use of iterative reconstruction technique. COMPARISON:  November 10, 2022 FINDINGS: Brain: There is mild cerebral atrophy with widening of the extra-axial spaces and ventricular dilatation. There are areas of decreased attenuation within the white matter tracts of the supratentorial brain, consistent with microvascular disease changes. Vascular: There is mild to moderate severity calcification of the bilateral cavernous carotid arteries. Skull: Small, bilateral nondisplaced nasal bone fractures are seen. These are of indeterminate age. Sinuses/Orbits: There is marked severity bilateral maxillary sinus and bilateral ethmoid sinus mucosal thickening. Other: None. IMPRESSION: 1. No acute intracranial abnormality. 2. Generalized cerebral atrophy with chronic white matter small vessel ischemic changes. 3. Marked severity bilateral maxillary sinus and  bilateral ethmoid sinus disease. Electronically Signed   By: Virgina Norfolk M.D.   On: 11/11/2022 01:38   DG Knee 1-2 Views Right  Result Date: 11/11/2022 CLINICAL DATA:  Recent fall with knee pain, initial encounter EXAM: RIGHT KNEE - 2 VIEW COMPARISON:  07/09/2021 FINDINGS: No acute fracture or dislocation is noted. Degenerative changes of the knee joint are seen. Mild soft tissue swelling is noted in the suprapatellar region likely related to the recent injury. IMPRESSION: Degenerative change without acute bony abnormality. Electronically Signed   By: Inez Catalina M.D.   On: 11/11/2022 01:21   DG CHEST PORT 1 VIEW  Result Date: 11/11/2022 CLINICAL DATA:  Syncope. EXAM: PORTABLE CHEST 1 VIEW COMPARISON:  None Available. FINDINGS: The cardiac silhouette is enlarged and unchanged in size. There is moderate severity  calcification of the aortic arch. Mild, bilateral perihilar atelectatic changes are seen. There is no evidence of a pleural effusion or pneumothorax. The visualized skeletal structures are unremarkable. IMPRESSION: Cardiomegaly with mild bilateral perihilar atelectatic changes. Electronically Signed   By: Virgina Norfolk M.D.   On: 11/11/2022 00:28   DG Lumbar Spine Complete  Result Date: 11/10/2022 CLINICAL DATA:  Fall. EXAM: LUMBAR SPINE - COMPLETE 4+ VIEW COMPARISON:  None Available. FINDINGS: No fracture or traumatic malalignment. Multilevel degenerative disc disease, most prominent in the lower lumbar spine. Lower lumbar facet degenerative changes. No other bony or soft tissue abnormalities identified. IMPRESSION: No fracture or traumatic malalignment. Degenerative changes as above. Electronically Signed   By: Dorise Bullion III M.D.   On: 11/10/2022 17:51   CT Head Wo Contrast  Result Date: 11/10/2022 CLINICAL DATA:  Seizures, new onset.  Head and facial trauma. EXAM: CT HEAD WITHOUT CONTRAST CT MAXILLOFACIAL WITHOUT CONTRAST TECHNIQUE: Multidetector CT imaging of the head and maxillofacial structures were performed using the standard protocol without intravenous contrast. Multiplanar CT image reconstructions of the maxillofacial structures were also generated. RADIATION DOSE REDUCTION: This exam was performed according to the departmental dose-optimization program which includes automated exposure control, adjustment of the mA and/or kV according to patient size and/or use of iterative reconstruction technique. COMPARISON:  None Available. FINDINGS: CT HEAD FINDINGS Brain: No evidence of acute infarction, hemorrhage, hydrocephalus, extra-axial collection or mass lesion/mass effect. Mild generalized cerebral atrophy. Patchy area of low-attenuation of the periventricular white matter presumed chronic microvascular ischemic changes. Vascular: No hyperdense vessel or unexpected calcification. Skull:  Normal. Negative for fracture or focal lesion. Other: None. CT MAXILLOFACIAL FINDINGS Osseous: No fracture or mandibular dislocation. No destructive process. Orbits: Negative. No traumatic or inflammatory finding. Sinuses: Marked mucosal thickening of the bilateral maxillary sinuses with partial opacification. Mucosal thickening of the ethmoid air cells. Remaining paranasal sinuses and mastoid air cells appear clear. Soft tissues: No significant hematoma or fluid collection. IMPRESSION: CT head: 1. No acute intracranial abnormality. 2. Mild generalized cerebral atrophy and chronic microvascular ischemic changes of the white matter. CT maxillofacial: 1. No acute facial bone fracture. 2. Bilateral maxillary and ethmoid sinus disease. Electronically Signed   By: Keane Police D.O.   On: 11/10/2022 17:33   CT Maxillofacial Wo Contrast  Result Date: 11/10/2022 CLINICAL DATA:  Seizures, new onset.  Head and facial trauma. EXAM: CT HEAD WITHOUT CONTRAST CT MAXILLOFACIAL WITHOUT CONTRAST TECHNIQUE: Multidetector CT imaging of the head and maxillofacial structures were performed using the standard protocol without intravenous contrast. Multiplanar CT image reconstructions of the maxillofacial structures were also generated. RADIATION DOSE REDUCTION: This exam was performed according to the departmental dose-optimization  program which includes automated exposure control, adjustment of the mA and/or kV according to patient size and/or use of iterative reconstruction technique. COMPARISON:  None Available. FINDINGS: CT HEAD FINDINGS Brain: No evidence of acute infarction, hemorrhage, hydrocephalus, extra-axial collection or mass lesion/mass effect. Mild generalized cerebral atrophy. Patchy area of low-attenuation of the periventricular white matter presumed chronic microvascular ischemic changes. Vascular: No hyperdense vessel or unexpected calcification. Skull: Normal. Negative for fracture or focal lesion. Other: None. CT  MAXILLOFACIAL FINDINGS Osseous: No fracture or mandibular dislocation. No destructive process. Orbits: Negative. No traumatic or inflammatory finding. Sinuses: Marked mucosal thickening of the bilateral maxillary sinuses with partial opacification. Mucosal thickening of the ethmoid air cells. Remaining paranasal sinuses and mastoid air cells appear clear. Soft tissues: No significant hematoma or fluid collection. IMPRESSION: CT head: 1. No acute intracranial abnormality. 2. Mild generalized cerebral atrophy and chronic microvascular ischemic changes of the white matter. CT maxillofacial: 1. No acute facial bone fracture. 2. Bilateral maxillary and ethmoid sinus disease. Electronically Signed   By: Keane Police D.O.   On: 11/10/2022 17:33    Microbiology: Results for orders placed or performed during the hospital encounter of 11/10/22  Resp panel by RT-PCR (RSV, Flu A&B, Covid) Anterior Nasal Swab     Status: Abnormal   Collection Time: 11/11/22 12:23 AM   Specimen: Anterior Nasal Swab  Result Value Ref Range Status   SARS Coronavirus 2 by RT PCR NEGATIVE NEGATIVE Final    Comment: (NOTE) SARS-CoV-2 target nucleic acids are NOT DETECTED.  The SARS-CoV-2 RNA is generally detectable in upper respiratory specimens during the acute phase of infection. The lowest concentration of SARS-CoV-2 viral copies this assay can detect is 138 copies/mL. A negative result does not preclude SARS-Cov-2 infection and should not be used as the sole basis for treatment or other patient management decisions. A negative result may occur with  improper specimen collection/handling, submission of specimen other than nasopharyngeal swab, presence of viral mutation(s) within the areas targeted by this assay, and inadequate number of viral copies(<138 copies/mL). A negative result must be combined with clinical observations, patient history, and epidemiological information. The expected result is Negative.  Fact Sheet  for Patients:  EntrepreneurPulse.com.au  Fact Sheet for Healthcare Providers:  IncredibleEmployment.be  This test is no t yet approved or cleared by the [place] FDA and  has been authorized for detection and/or diagnosis of SARS-CoV-2 by FDA under an Emergency Use Authorization (EUA). This EUA will remain  in effect (meaning this test can be used) for the duration of the COVID-19 declaration under Section 564(b)(1) of the Act, 21 U.S.C.section 360bbb-3(b)(1), unless the authorization is terminated  or revoked sooner.       Influenza A by PCR NEGATIVE NEGATIVE Final   Influenza B by PCR NEGATIVE NEGATIVE Final    Comment: (NOTE) The Xpert Xpress SARS-CoV-2/FLU/RSV plus assay is intended as an aid in the diagnosis of influenza from Nasopharyngeal swab specimens and should not be used as a sole basis for treatment. Nasal washings and aspirates are unacceptable for Xpert Xpress SARS-CoV-2/FLU/RSV testing.  Fact Sheet for Patients: EntrepreneurPulse.com.au  Fact Sheet for Healthcare Providers: IncredibleEmployment.be  This test is not yet approved or cleared by the [place] FDA and has been authorized for detection and/or diagnosis of SARS-CoV-2 by FDA under an Emergency Use Authorization (EUA). This EUA will remain in effect (meaning this test can be used) for the duration of the COVID-19 declaration under Section 564(b)(1) of the Act, 21 U.S.C.  section 360bbb-3(b)(1), unless the authorization is terminated or revoked.     Resp Syncytial Virus by PCR POSITIVE (A) NEGATIVE Final    Comment: (NOTE) Fact Sheet for Patients: EntrepreneurPulse.com.au  Fact Sheet for Healthcare Providers: IncredibleEmployment.be  This test is not yet approved or cleared by the [place] FDA and has been authorized for detection and/or diagnosis of SARS-CoV-2 by FDA under an  Emergency Use Authorization (EUA). This EUA will remain in effect (meaning this test can be used) for the duration of the COVID-19 declaration under Section 564(b)(1) of the Act, 21 U.S.C. section 360bbb-3(b)(1), unless the authorization is terminated or revoked.  Performed at [facility], [place] [address], [place], [place] [address]     Labs: CBC: Recent Labs  Lab 11/12/22 0545 11/13/22 0304 11/15/22 0341 11/16/22 0321  WBC 4.9 5.2 5.0 4.6  NEUTROABS 3.0 3.1  --  2.3  HGB 11.1* 11.5* 11.3* 11.2*  HCT 33.3* 32.8* 34.6* 33.0*  MCV 94.6 92.7 94.8 92.2  PLT 49* 48* 81* 99*   Basic Metabolic Panel: Recent Labs  Lab 11/13/22 0304 11/14/22 0448 11/15/22 0341 11/16/22 0321 11/17/22 0359 11/18/22 0457  NA 136 136 134* 136 137  --   K 4.0 3.6 3.9 3.7 4.2  --   CL 102 103 101 102 103  --   CO2 '24 24 25 24 24  '$ --   GLUCOSE 101* 90 97 96 98  --   BUN '7 7 16 '$ 22* 25*  --   CREATININE 0.60* 0.50* 0.81 0.75 0.67  --   CALCIUM 8.6* 8.9 9.2 9.5 9.6  --   MG 1.7 1.5* 1.7 1.5*  --  1.6*   Liver Function Tests: No results for input(s): "AST", "ALT", "ALKPHOS", "BILITOT", "PROT", "ALBUMIN" in the last 168 hours. CBG: No results for input(s): "GLUCAP" in the last 168 hours.  Discharge time spent: greater than 30 minutes.  Signed: Tawni Millers, MD [facility] 11/18/2022

## 2022-11-18 NOTE — TOC Transition Note (Signed)
Transition of Care [facility]) - CM/SW Discharge Note   Patient Details  Name: Jose [name]. MRN: [id number] Date of Birth: [birth year]  [facility]) CM/SW Contact:  Pollie Friar, RN Phone Number: 11/18/2022, 11:21 AM   Clinical Narrative:    Pt is discharging home with self care. No f/u per PT/OT.  Medications for home to be delivered to the room per [place]. Pt has transport home.   Final next level of care: Home/Self Care Barriers to Discharge: No Barriers Identified   Patient Goals and CMS Choice      Discharge Placement                         Discharge Plan and Services Additional resources added to the After Visit Summary for     Discharge Planning Services: CM Consult                                 Social Determinants of Health (SDOH) Interventions SDOH Screenings   Food Insecurity: No Food Insecurity (11/11/2022)  Housing: Low Risk  (11/11/2022)  Transportation Needs: No Transportation Needs (11/11/2022)  Utilities: Not At Risk (11/11/2022)  Depression (PHQ2-9): Low Risk  (08/24/2022)  Financial Resource Strain: Low Risk  (07/01/2019)  Physical Activity: Sufficiently Active (07/01/2019)  Social Connections: Moderately Isolated (07/01/2019)  Stress: No Stress Concern Present (09/02/2019)  Tobacco Use: High Risk (11/11/2022)     Readmission Risk Interventions     No data to display

## 2022-11-18 NOTE — Plan of Care (Signed)
  Problem: Education: Goal: Expressions of having a comfortable level of knowledge regarding the disease process will increase Outcome: Progressing   Problem: Coping: Goal: Ability to adjust to condition or change in health will improve Outcome: Progressing Goal: Ability to identify appropriate support needs will improve Outcome: Progressing   Problem: Health Behavior/Discharge Planning: Goal: Compliance with prescribed medication regimen will improve Outcome: Progressing   Problem: Medication: Goal: Risk for medication side effects will decrease Outcome: Progressing   Problem: Clinical Measurements: Goal: Complications related to the disease process, condition or treatment will be avoided or minimized Outcome: Progressing Goal: Diagnostic test results will improve Outcome: Progressing   Problem: Safety: Goal: Verbalization of understanding the information provided will improve Outcome: Progressing   Problem: Self-Concept: Goal: Level of anxiety will decrease Outcome: Progressing Goal: Ability to verbalize feelings about condition will improve Outcome: Progressing   Problem: Education: Goal: Knowledge of General Education information will improve Description: Including pain rating scale, medication(s)/side effects and non-pharmacologic comfort measures Outcome: Progressing   Problem: Health Behavior/Discharge Planning: Goal: Ability to manage health-related needs will improve Outcome: Progressing   Problem: Clinical Measurements: Goal: Ability to maintain clinical measurements within normal limits will improve Outcome: Progressing Goal: Will remain free from infection Outcome: Progressing Goal: Diagnostic test results will improve Outcome: Progressing Goal: Respiratory complications will improve Outcome: Progressing Goal: Cardiovascular complication will be avoided Outcome: Progressing   Problem: Activity: Goal: Risk for activity intolerance will  decrease Outcome: Progressing   Problem: Nutrition: Goal: Adequate nutrition will be maintained Outcome: Progressing   Problem: Coping: Goal: Level of anxiety will decrease Outcome: Progressing   Problem: Elimination: Goal: Will not experience complications related to bowel motility Outcome: Progressing Goal: Will not experience complications related to urinary retention Outcome: Progressing   Problem: Pain Managment: Goal: General experience of comfort will improve Outcome: Progressing   Problem: Safety: Goal: Ability to remain free from injury will improve Outcome: Progressing   Problem: Skin Integrity: Goal: Risk for impaired skin integrity will decrease Outcome: Progressing   Problem: Safety: Goal: Non-violent Restraint(s) Outcome: Progressing

## 2022-11-18 NOTE — Progress Notes (Signed)
Occupational Therapy Treatment Patient Details Name: Jose [name]. MRN: [id number] DOB: [birth year] Today's Date: 11/18/2022   History of present illness Patient is a 52 yo male presenting to the ED with seizures, and hypotensive on 11/10/21.Found to have Afib with heparin administered on 11/11/21. PMH: alcohol abuse, alcohol-related seizure, hypertension, CHF, hyperlipidemia, tobacco use   OT comments  Patient continues to make steady progress towards goals in skilled OT session. Patient's session encompassed functional mobility, medication management, and theraband exercises. Patient with improved pathfinding and cognition, finding OT at the desk, and requesting to work with the OT. Patient able to strategize ways to adhere to medication management, and then completing theraband exercises with demonstration. Modifications provided with regard to theraband exercises due to L shoulder decreased AROM due to previous clavicular break. Patient with good demonstration throughout.    Recommendations for follow up therapy are one component of a multi-disciplinary discharge planning process, led by the attending physician.  Recommendations may be updated based on patient status, additional functional criteria and insurance authorization.    Follow Up Recommendations  No OT follow up     Assistance Recommended at Discharge Intermittent Supervision/Assistance  Patient can return home with the following  Assist for transportation   Equipment Recommendations  None recommended by OT    Recommendations for Other Services      Precautions / Restrictions Precautions Precautions: Other (comment) Precaution Comments: watch HR Restrictions Weight Bearing Restrictions: No       Mobility Bed Mobility Overal bed mobility: Independent                  Transfers Overall transfer level: Independent Equipment used: None               General transfer comment: patient finding OT at desk  asking for OT session     Balance Overall balance assessment: Mild deficits observed, not formally tested                                         ADL either performed or assessed with clinical judgement   ADL Overall ADL's : Needs assistance/impaired                                       General ADL Comments: Session focus on upper level cognition, medication management, and theraband exercises    Extremity/Trunk Assessment              Vision       Perception     Praxis      Cognition Arousal/Alertness: Awake/alert Behavior During Therapy: WFL for tasks assessed/performed Overall Cognitive Status: Within Functional Limits for tasks assessed                                 General Comments: Significant improvement in cognition, able to strategize ways to focus on his medication management        Exercises General Exercises - Upper Extremity Shoulder Flexion: Both, Theraband Theraband Level (Shoulder Flexion): Level 2 (Red), Level 3 (Green) Shoulder Extension: Both, Theraband Theraband Level (Shoulder Extension): Level 2 (Red), Level 3 (Green) Shoulder ABduction: Both, AROM, Theraband Theraband Level (Shoulder Abduction): Level 2 (Red), Level 3 (Green) Elbow Flexion: AROM, Both, Theraband  Theraband Level (Elbow Flexion): Level 2 (Red), Level 3 (Green) Elbow Extension: AROM, Both, Theraband Theraband Level (Elbow Extension): Level 2 (Red), Level 3 (Green)    Shoulder Instructions       General Comments      Pertinent Vitals/ Pain       Pain Assessment Pain Assessment: No/denies pain  Home Living                                          Prior Functioning/Environment              Frequency  Min 2X/week        Progress Toward Goals  OT Goals(current goals can now be found in the care plan section)  Progress towards OT goals: Progressing toward goals  Acute Rehab OT  Goals Patient Stated Goal: go home OT Goal Formulation: With patient Time For Goal Achievement: 11/26/22 Potential to Achieve Goals: Good  Plan Discharge plan remains appropriate    Co-evaluation                 AM-PAC OT "6 Clicks" Daily Activity     Outcome Measure   Help from another person eating meals?: None Help from another person taking care of personal grooming?: None Help from another person toileting, which includes using toliet, bedpan, or urinal?: None Help from another person bathing (including washing, rinsing, drying)?: None Help from another person to put on and taking off regular upper body clothing?: None Help from another person to put on and taking off regular lower body clothing?: None 6 Click Score: 24    End of Session    OT Visit Diagnosis: Unsteadiness on feet (R26.81);Muscle weakness (generalized) (M62.81)   Activity Tolerance Patient tolerated treatment well   Patient Left in chair;with call bell/phone within reach   Nurse Communication Mobility status        Time: 6270-3500 OT Time Calculation (min): 10 min  Charges: OT Treatments $Self Care/Home Management : 8-22 mins  Corinne Ports E. Sante Biedermann, OTR/L Acute Rehabilitation Services [phone]   Ascencion Dike 11/18/2022, 1:26 PM

## 2022-11-21 ENCOUNTER — Telehealth: Payer: Self-pay | Admitting: Nurse Practitioner

## 2022-11-21 ENCOUNTER — Telehealth: Payer: Self-pay

## 2022-11-21 ENCOUNTER — Ambulatory Visit: Payer: BC Managed Care – PPO | Attending: Internal Medicine | Admitting: Cardiology

## 2022-11-21 ENCOUNTER — Encounter: Payer: Self-pay | Admitting: Cardiology

## 2022-11-21 VITALS — BP 128/80 | HR 105 | Wt 206.6 lb

## 2022-11-21 DIAGNOSIS — F1010 Alcohol abuse, uncomplicated: Secondary | ICD-10-CM

## 2022-11-21 DIAGNOSIS — I110 Hypertensive heart disease with heart failure: Secondary | ICD-10-CM | POA: Diagnosis not present

## 2022-11-21 DIAGNOSIS — J449 Chronic obstructive pulmonary disease, unspecified: Secondary | ICD-10-CM | POA: Insufficient documentation

## 2022-11-21 DIAGNOSIS — D696 Thrombocytopenia, unspecified: Secondary | ICD-10-CM | POA: Insufficient documentation

## 2022-11-21 DIAGNOSIS — I5032 Chronic diastolic (congestive) heart failure: Secondary | ICD-10-CM | POA: Insufficient documentation

## 2022-11-21 DIAGNOSIS — I5022 Chronic systolic (congestive) heart failure: Secondary | ICD-10-CM

## 2022-11-21 DIAGNOSIS — I4819 Other persistent atrial fibrillation: Secondary | ICD-10-CM | POA: Insufficient documentation

## 2022-11-21 DIAGNOSIS — F17290 Nicotine dependence, other tobacco product, uncomplicated: Secondary | ICD-10-CM | POA: Diagnosis not present

## 2022-11-21 DIAGNOSIS — Z79899 Other long term (current) drug therapy: Secondary | ICD-10-CM | POA: Insufficient documentation

## 2022-11-21 DIAGNOSIS — E785 Hyperlipidemia, unspecified: Secondary | ICD-10-CM | POA: Insufficient documentation

## 2022-11-21 LAB — BASIC METABOLIC PANEL
Anion gap: 10 (ref 5–15)
BUN: 18 mg/dL (ref 6–20)
CO2: 26 mmol/L (ref 22–32)
Calcium: 9 mg/dL (ref 8.9–10.3)
Chloride: 101 mmol/L (ref 98–111)
Creatinine, Ser: 0.65 mg/dL (ref 0.61–1.24)
GFR, Estimated: >60 mL/min (ref >60)
Glucose, Bld: 96 mg/dL (ref 70–99)
Potassium: 4 mmol/L (ref 3.5–5.1)
Sodium: 137 mmol/L (ref 135–145)

## 2022-11-21 LAB — BRAIN NATRIURETIC PEPTIDE: B Natriuretic Peptide: 385.2 pg/mL — ABNORMAL HIGH (ref 0.0–100.0)

## 2022-11-21 MED ORDER — SPIRONOLACTONE 25 MG PO TABS: 25.0000 mg | ORAL_TABLET | Freq: Every day | ORAL | 3 refills | Status: DC

## 2022-11-21 NOTE — Telephone Encounter (Signed)
Transition Care Management Unsuccessful Follow-up Telephone Call  Date of discharge and from where:  [facility] 1.12.24  Attempts:  1st Attempt  Reason for unsuccessful TCM follow-up call:  Left voice message

## 2022-11-21 NOTE — Progress Notes (Signed)
ADVANCED HEART FAILURE CLINIC NOTE  Referring Physician: Jon Billings, NP  Primary Care: Jon Billings, NP  HPI: Jose Weltz. is a 52 y.o. male with hypertension, hyperlipidemia, COPD, significant alcohol abuse, history of seizures, persistent atrial fibrillation presenting today as a posthospital follow-up.  As per chart review and the patient, he drinks at least half a pint of liquor daily (likely much higher consumption). Jose [name] was admitted to the hospital roughly 1 week ago for fevers chills cough and fatigue.  He presented hypotensive to 60s/40s with new onset atrial fibrillation with RVR. TTE during admit with LVEF of 60-65%. He was seen by neurology for possible alcohol withdrawal related seizures and continued on home tegretol (undetectable levels on admit). For his atrial fibrillation he was discharged home on cardizem & lopressor. Anticoagulation was not started due to concern for continued alcohol abuse and thrombocytopenia.   From a functional standpoint he reports to doing poorly. He becomes quickly short of breath and has had progressively worsening LE edema. He's been sleeping in a recliner due to orthopnea. He reports only drinking 1 beer this weekend. Otherwise, no lightheadedness, chest pain/pressure; reports compliance with medications. Currently taking lasix '40mg'$  PO every morning after which he has significant urine output for a few hours.   Activity level/exercise tolerance:  NYHA III Orthopnea:  Sleeps in a recliner Paroxysmal noctural dyspnea:  Yes Chest pain/pressure:  no Orthostatic lightheadedness:  no Palpitations:  intermittent Lower extremity edema:  yes, 2+ Presyncope/syncope:  no Cough:  no  Past Medical History:  Diagnosis Date   Alcohol abuse    Cervicalgia    CHF (congestive heart failure) (HCC)    COPD (chronic obstructive pulmonary disease) (HCC)    Hyperlipidemia    Hypertension    Seizures (HCC)     Current Outpatient Medications   Medication Sig Dispense Refill   albuterol (VENTOLIN HFA) 108 (90 Base) MCG/ACT inhaler Inhale 2 puffs into the lungs every 6 (six) hours as needed for wheezing or shortness of breath. 8 g 2   carbamazepine (TEGRETOL) 200 MG tablet Take 1 tablet (200 mg total) by mouth 2 (two) times daily. 180 tablet 1   diltiazem (CARDIZEM CD) 360 MG 24 hr capsule Take 1 capsule (360 mg total) by mouth daily. 30 capsule 0   empagliflozin (JARDIANCE) 10 MG TABS tablet Take 1 tablet (10 mg total) by mouth daily before breakfast. 90 tablet 1   Fluticasone-Umeclidin-Vilant (TRELEGY ELLIPTA) 100-62.5-25 MCG/ACT AEPB Inhale 1 puff into the lungs daily. 14 each 0   furosemide (LASIX) 40 MG tablet Take 1 tablet (40 mg total) by mouth daily. 90 tablet 3   Iron, Ferrous Sulfate, 325 (65 Fe) MG TABS Take 325 mg by mouth daily. 90 tablet 1   LORazepam (ATIVAN) 1 MG tablet Take 1 tablet (1 mg total) by mouth every 6 (six) hours as needed for anxiety. 10 tablet 0   magnesium oxide (MAG-OX) 400 MG tablet Take 1 tablet (400 mg total) by mouth 2 (two) times daily for 5 days. 10 tablet 0   metoprolol tartrate (LOPRESSOR) 25 MG tablet Take 3 tablets (75 mg total) by mouth 2 (two) times daily. 180 tablet 0   potassium chloride (MICRO-K) 10 MEQ CR capsule Take 1 capsule (10 mEq total) by mouth daily. 90 capsule 5   rosuvastatin (CRESTOR) 20 MG tablet Take 1 tablet (20 mg total) by mouth daily. 30 tablet 0   sacubitril-valsartan (ENTRESTO) 49-51 MG Take 1 tablet by mouth 2 (  two) times daily. 60 tablet 0   No current facility-administered medications for this visit.    No Known Allergies    Social History   Socioeconomic History   Marital status: Widowed    Spouse name: Not on file   Number of children: Not on file   Years of education: Not on file   Highest education level: Not on file  Occupational History   Not on file  Tobacco Use   Smoking status: Every Day    Packs/day: 1.00    Years: 33.00    Total pack years:  33.00    Types: Cigars, Cigarettes    Start date: 36    Last attempt to quit: 2020    Years since quitting: 4.0   Smokeless tobacco: Former   Tobacco comments:    2 cigars a day-05/05/2022    Quit smoking cigarettes 3 years ago.  Smokes cigarettes since he was 52 years old.  Vaping Use   Vaping Use: Never used  Substance and Sexual Activity   Alcohol use: Yes    Alcohol/week: 28.0 standard drinks of alcohol    Types: 7 Cans of beer, 21 Shots of liquor per week    Comment: pint of liquor per day   Drug use: No    Types: Marijuana    Comment: quit back in the late 90's   Sexual activity: Yes  Other Topics Concern   Not on file  Social History Narrative   Not on file   Social Determinants of Health   Financial Resource Strain: Low Risk  (07/01/2019)   Overall Financial Resource Strain (CARDIA)    Difficulty of Paying Living Expenses: Not hard at all  Food Insecurity: No Food Insecurity (11/11/2022)   Hunger Vital Sign    Worried About Running Out of Food in the Last Year: Never true    Ran Out of Food in the Last Year: Never true  Transportation Needs: No Transportation Needs (11/11/2022)   PRAPARE - Hydrologist (Medical): No    Lack of Transportation (Non-Medical): No  Physical Activity: Sufficiently Active (07/01/2019)   Exercise Vital Sign    Days of Exercise per Week: 3 days    Minutes of Exercise per Session: 50 min  Stress: No Stress Concern Present (09/02/2019)   [place]    Feeling of Stress : Not at all  Social Connections: Moderately Isolated (07/01/2019)   Social Connection and Isolation Panel [NHANES]    Frequency of Communication with Friends and Family: Three times a week    Frequency of Social Gatherings with Friends and Family: Three times a week    Attends Religious Services: 1 to 4 times per year    Active Member of Clubs or Organizations: No    Attends Theatre manager Meetings: Never    Marital Status: Widowed  Intimate Partner Violence: Not At Risk (11/11/2022)   Humiliation, Afraid, Rape, and Kick questionnaire    Fear of Current or Ex-Partner: No    Emotionally Abused: No    Physically Abused: No    Sexually Abused: No      Family History  Problem Relation Age of Onset   Hypertension Mother    Diabetes Mother    Colon cancer Mother    Hypertension Father    Multiple sclerosis Father     PHYSICAL EXAM: Vitals:   11/21/22 1352  BP: 128/80  Pulse: (!) 105  SpO2: 98%   GENERAL: chronically ill appearing AAM, NAD HEENT: Negative for arcus senilis or xanthelasma. There is no scleral icterus.  The mucous membranes are pink and moist.   NECK: Supple, No masses. Normal carotid upstrokes without bruits. No masses or thyromegaly.    CHEST: There are no chest wall deformities. There is no chest wall tenderness. Respirations are unlabored.  Lungs- crackles to mid lung bases b/l CARDIAC:  JVP: 10-11 cm H2O         Normal S1, S2  Normal rate with irrregular rhythm. No murmurs, rubs or gallops.  Pulses are 2+ and symmetrical in upper and lower extremities. 2+ edema.  ABDOMEN: Soft, non-tender, non-distended. There are no masses or hepatomegaly. There are normal bowel sounds.  EXTREMITIES: Warm and well perfused with no cyanosis, clubbing.  LYMPHATIC: No axillary or supraclavicular lymphadenopathy.  NEUROLOGIC: Patient is oriented x3 with no focal or lateralizing neurologic deficits.  PSYCH: Patients affect is appropriate, there is no evidence of anxiety or depression.  SKIN: Warm and dry; no lesions or wounds.   DATA REVIEWED  YQI:HKVQQV fibrillation w/ RVR  ECHO: 11/11/22: LVEF 50-55%, normal RV function 11/05/21: LVEF 60-65%, normal RV function   ASSESSMENT & PLAN:  Heart failure with mildly reduced EF  Etiology of ZD:GLOVFIE HFpEF with now mild reduction in LVEF likely due to atrial fibrillation NYHA class / AHA  Stage:III Volume status & Diuretics: hypervolemic, increase lasix to '40mg'$  BID (reports significant urine output with just '40mg'$  in the morning). Repeat labs in 1 week. Vasodilators:Entresto 49/'51mg'$  BID Beta-Blocker:metoprolol tartrate '75mg'$  BID, will transition to Toprol '100mg'$  daily PPI:RJJOA spironolactone '25mg'$  daily, repeat labs today Cardiometabolic:jardiance '10mg'$  daily Devices therapies & Valvulopathies:not indicated Advanced therapies:not indicated  2. Atrial fibrillation  - In atrial fibrillation today  - I discussed TEE/DCCV today with the patient; unfortunately, I remain concerned about his ability to remain compliant with anticoagulation, underlying thrombocytopenia and alcohol abuse. For the time being we will continue rate control and follow up in 1 month with plan to move forward with TEE/DCCV.   3. Alcohol abuse - Long history of alcohol abuse - Today on exam appeared possibly intoxicated. Will continue conservative treatment for the time being. Discussed importance of alcohol cessation at length with patient and mother today.   Jose [name] Advanced Heart Failure Mechanical Circulatory Support

## 2022-11-21 NOTE — Patient Instructions (Signed)
Medication Changes:  INCREASE Furosemide to 40 mg Twice daily FOR 1 WEEK ONLY, then back to 40 mg Daily  START Spironolactone 25 mg Daily  Lab Work:  Labs done today, we will call you for abnormal results  Your physician recommends that you return for lab work in: 1 week, please go to:  Nature conservation officer at [facility] 1st desk on the right to check in (REGISTRATION)  Lab hours: Monday- Friday (7:30 am- 5:30 pm)  Testing/Procedures:  none  Referrals:  none  Special Instructions // Education:  Do the following things EVERYDAY: Weigh yourself in the morning before breakfast. Write it down and keep it in a log. Take your medicines as prescribed Eat low salt foods--Limit salt (sodium) to 2000 mg per day.  Stay as active as you can everyday Limit all fluids for the day to less than 2 liters   Follow-Up in: 1 month    If you have any questions or concerns before your next appointment please send [place] a message through mychart or call our office at [phone] Monday-Friday 8 am-5 pm.   If you have an urgent need after hours on the weekend please call your Primary Cardiologist or the [facility] in [place] at [phone].

## 2022-11-21 NOTE — Telephone Encounter (Signed)
Patient needs disability  paperwork completed by provider. Paperwork was placed in the provider folder for completion. Patient has an appointment on 11/24/2022 and will take paperwork then.

## 2022-11-22 NOTE — Telephone Encounter (Signed)
Transition Care Management Unsuccessful Follow-up Telephone Call  Date of discharge and from where:  [facility] 1.12.24  Attempts:  2nd Attempt  Reason for unsuccessful TCM follow-up call:  Left voice message

## 2022-11-24 ENCOUNTER — Ambulatory Visit (INDEPENDENT_AMBULATORY_CARE_PROVIDER_SITE_OTHER): Payer: BC Managed Care – PPO | Admitting: Nurse Practitioner

## 2022-11-24 ENCOUNTER — Encounter: Payer: Self-pay | Admitting: Nurse Practitioner

## 2022-11-24 VITALS — BP 103/70 | HR 62 | Temp 99.1°F | Wt 203.3 lb

## 2022-11-24 DIAGNOSIS — E7800 Pure hypercholesterolemia, unspecified: Secondary | ICD-10-CM

## 2022-11-24 DIAGNOSIS — R7303 Prediabetes: Secondary | ICD-10-CM

## 2022-11-24 DIAGNOSIS — F1010 Alcohol abuse, uncomplicated: Secondary | ICD-10-CM

## 2022-11-24 DIAGNOSIS — J449 Chronic obstructive pulmonary disease, unspecified: Secondary | ICD-10-CM | POA: Diagnosis not present

## 2022-11-24 DIAGNOSIS — Z09 Encounter for follow-up examination after completed treatment for conditions other than malignant neoplasm: Secondary | ICD-10-CM

## 2022-11-24 DIAGNOSIS — I10 Essential (primary) hypertension: Secondary | ICD-10-CM

## 2022-11-24 DIAGNOSIS — I5032 Chronic diastolic (congestive) heart failure: Secondary | ICD-10-CM

## 2022-11-24 DIAGNOSIS — G40909 Epilepsy, unspecified, not intractable, without status epilepticus: Secondary | ICD-10-CM

## 2022-11-24 DIAGNOSIS — D696 Thrombocytopenia, unspecified: Secondary | ICD-10-CM

## 2022-11-24 DIAGNOSIS — I4891 Unspecified atrial fibrillation: Secondary | ICD-10-CM

## 2022-11-24 MED ORDER — ROSUVASTATIN CALCIUM 20 MG PO TABS: 20.0000 mg | ORAL_TABLET | Freq: Every day | ORAL | 1 refills | Status: DC

## 2022-11-24 MED ORDER — SACUBITRIL-VALSARTAN 49-51 MG PO TABS: 1.0000 | ORAL_TABLET | Freq: Two times a day (BID) | ORAL | 1 refills | Status: DC

## 2022-11-24 MED ORDER — DILTIAZEM HCL ER COATED BEADS 360 MG PO CP24: 360.0000 mg | ORAL_CAPSULE | Freq: Every day | ORAL | 1 refills | Status: DC

## 2022-11-24 MED ORDER — TRELEGY ELLIPTA 100-62.5-25 MCG/ACT IN AEPB: 1.0000 | INHALATION_SPRAY | Freq: Every day | RESPIRATORY_TRACT | 0 refills | Status: DC

## 2022-11-24 NOTE — Progress Notes (Signed)
BP 103/70   Pulse 62   Temp 99.1 F (37.3 C) (Oral)   Wt 203 lb 4.8 oz (92.2 kg)   SpO2 96%   BMI 29.17 kg/m    Subjective:    Patient ID: Jose Dakins., male    DOB: 1971-02-14, 52 y.o.   MRN: [id number]  HPI: Jose Pacella. is a 52 y.o. male  Chief Complaint  Patient presents with   Diabetes   Hyperlipidemia   Hypertension   Hospitalization Follow-up   Transition of [facility] Follow up.   Hospital/Facility: [facility] D/C Physician: Dr. Cathlean Sauer D/C Date: 11/18/22  Records Requested: NA Records Received: Yes Records Reviewed: Yes  Diagnoses on Discharge:   Principal Problem:   Seizures ([place]) Active Problems:   Hypertension   COPD (chronic obstructive pulmonary disease) ([place])   Chronic heart failure with preserved ejection fraction (HCC)   Thrombocytopenia (HCC)   Atrial fibrillation with RVR (HCC)   RSV (acute bronchiolitis due to respiratory syncytial virus)   Hypomagnesemia  Date of interactive Contact within 48 hours of discharge:  Contact was through: phone  Date of 7 day or 14 day face-to-face visit:    within 7 days  Outpatient Encounter Medications as of 11/24/2022  Medication Sig   albuterol (VENTOLIN HFA) 108 (90 Base) MCG/ACT inhaler Inhale 2 puffs into the lungs every 6 (six) hours as needed for wheezing or shortness of breath.   carbamazepine (TEGRETOL) 200 MG tablet Take 1 tablet (200 mg total) by mouth 2 (two) times daily.   empagliflozin (JARDIANCE) 10 MG TABS tablet Take 1 tablet (10 mg total) by mouth daily before breakfast.   furosemide (LASIX) 40 MG tablet Take 1 tablet (40 mg total) by mouth daily.   Iron, Ferrous Sulfate, 325 (65 Fe) MG TABS Take 325 mg by mouth daily.   LORazepam (ATIVAN) 1 MG tablet Take 1 tablet (1 mg total) by mouth every 6 (six) hours as needed for anxiety.   Magnesium Oxide -Mg Supplement (MAG-OXIDE) 200 MG TABS Take 1 tablet (200 mg total) by mouth 2 (two) times daily.   metoprolol tartrate (LOPRESSOR) 25 MG tablet  Take 3 tablets (75 mg total) by mouth 2 (two) times daily.   potassium chloride (MICRO-K) 10 MEQ CR capsule Take 1 capsule (10 mEq total) by mouth daily.   spironolactone (ALDACTONE) 25 MG tablet Take 1 tablet (25 mg total) by mouth daily.   [DISCONTINUED] diltiazem (CARDIZEM CD) 360 MG 24 hr capsule Take 1 capsule (360 mg total) by mouth daily.   [DISCONTINUED] Fluticasone-Umeclidin-Vilant (TRELEGY ELLIPTA) 100-62.5-25 MCG/ACT AEPB Inhale 1 puff into the lungs daily.   [DISCONTINUED] rosuvastatin (CRESTOR) 20 MG tablet Take 1 tablet (20 mg total) by mouth daily.   [DISCONTINUED] sacubitril-valsartan (ENTRESTO) 49-51 MG Take 1 tablet by mouth 2 (two) times daily.   diltiazem (CARDIZEM CD) 360 MG 24 hr capsule Take 1 capsule (360 mg total) by mouth daily.   Fluticasone-Umeclidin-Vilant (TRELEGY ELLIPTA) 100-62.5-25 MCG/ACT AEPB Inhale 1 puff into the lungs daily.   rosuvastatin (CRESTOR) 20 MG tablet Take 1 tablet (20 mg total) by mouth daily.   sacubitril-valsartan (ENTRESTO) 49-51 MG Take 1 tablet by mouth 2 (two) times daily.   No facility-administered encounter medications on file as of 11/24/2022.    Diagnostic Tests Reviewed/Disposition: reviewed   Consults: neurology and cardiology  Discharge Instructions: Reviewed  Disease/illness Education: Provided during visit  Home Health/Community Services Discussions/Referrals: Needs to see Neurology outpatient. Has an appointment with CHF clinic  scheduled.  Establishment or re-establishment of referral orders for community resources:  Referral to Neurology  Discussion with other health care providers: no  Assessment and Support of treatment regimen adherence: Provided during visit.  Appointments Coordinated with: Patient  Education for self-management, independent living, and ADLs: Provided during visit.  Patient states he is feeling rough.  He drank on Sunday but didn't drink anything yesterday and the day before.    Relevant past  medical, surgical, family and social history reviewed and updated as indicated. Interim medical history since our last visit reviewed. Allergies and medications reviewed and updated.  Review of Systems  Constitutional:  Positive for fatigue.  Eyes:  Negative for visual disturbance.  Respiratory:  Positive for shortness of breath.   Cardiovascular:  Negative for chest pain and leg swelling.  Neurological:  Negative for light-headedness and headaches.    Per HPI unless specifically indicated above     Objective:    BP 103/70   Pulse 62   Temp 99.1 F (37.3 C) (Oral)   Wt 203 lb 4.8 oz (92.2 kg)   SpO2 96%   BMI 29.17 kg/m   Wt Readings from Last 3 Encounters:  11/24/22 203 lb 4.8 oz (92.2 kg)  11/21/22 206 lb 9.6 oz (93.7 kg)  11/11/22 201 lb 1 oz (91.2 kg)    Physical Exam Vitals and nursing note reviewed.  Constitutional:      General: He is not in acute distress.    Appearance: Normal appearance. He is not ill-appearing, toxic-appearing or diaphoretic.  HENT:     Head: Normocephalic.     Right Ear: External ear normal.     Left Ear: External ear normal.     Nose: Nose normal. No congestion or rhinorrhea.     Mouth/Throat:     Mouth: Mucous membranes are moist.  Eyes:     General:        Right eye: No discharge.        Left eye: No discharge.     Extraocular Movements: Extraocular movements intact.     Conjunctiva/sclera: Conjunctivae normal.     Pupils: Pupils are equal, round, and reactive to light.  Cardiovascular:     Rate and Rhythm: Normal rate and regular rhythm.     Heart sounds: No murmur heard. Pulmonary:     Effort: Pulmonary effort is normal. No respiratory distress.     Breath sounds: Normal breath sounds. No wheezing, rhonchi or rales.  Abdominal:     General: Abdomen is flat. Bowel sounds are normal.  Musculoskeletal:     Cervical back: Normal range of motion and neck supple.  Skin:    General: Skin is warm and dry.     Capillary Refill:  Capillary refill takes less than 2 seconds.  Neurological:     General: No focal deficit present.     Mental Status: He is alert and oriented to person, place, and time.  Psychiatric:        Mood and Affect: Mood normal.        Behavior: Behavior normal.        Thought Content: Thought content normal.        Judgment: Judgment normal.     Results for orders placed or performed in visit on 11/24/22  Comp Met (CMET)  Result Value Ref Range   Glucose 91 70 - 99 mg/dL   BUN 17 6 - 24 mg/dL   Creatinine, Ser 0.73 (L) 0.76 - 1.27 mg/dL  eGFR 109 >59 mL/min/1.73   BUN/Creatinine Ratio 23 (H) 9 - 20   Sodium 140 134 - 144 mmol/L   Potassium 4.3 3.5 - 5.2 mmol/L   Chloride 96 96 - 106 mmol/L   CO2 28 20 - 29 mmol/L   Calcium 9.5 8.7 - 10.2 mg/dL   Total Protein 6.9 6.0 - 8.5 g/dL   Albumin 4.0 3.8 - 4.9 g/dL   Globulin, Total 2.9 1.5 - 4.5 g/dL   Albumin/Globulin Ratio 1.4 1.2 - 2.2   Bilirubin Total <0.2 0.0 - 1.2 mg/dL   Alkaline Phosphatase 176 (H) 44 - 121 IU/L   AST 15 0 - 40 IU/L   ALT 11 0 - 44 IU/L  Lipid Profile  Result Value Ref Range   Cholesterol, Total 174 100 - 199 mg/dL   Triglycerides 56 0 - 149 mg/dL   HDL 69 >39 mg/dL   VLDL Cholesterol Cal 11 5 - 40 mg/dL   LDL Chol Calc (NIH) 94 0 - 99 mg/dL   Chol/HDL Ratio 2.5 0.0 - 5.0 ratio  HgB A1c  Result Value Ref Range   Hgb A1c MFr Bld 5.9 (H) 4.8 - 5.6 %   Est. average glucose Bld gHb Est-mCnc 123 mg/dL  CBC w/Diff  Result Value Ref Range   WBC 7.1 3.4 - 10.8 x10E3/uL   RBC 4.09 (L) 4.14 - 5.80 x10E6/uL   Hemoglobin 12.3 (L) 13.0 - 17.7 g/dL   Hematocrit 37.2 (L) 37.5 - 51.0 %   MCV 91 79 - 97 fL   MCH 30.1 26.6 - 33.0 pg   MCHC 33.1 31.5 - 35.7 g/dL   RDW 13.6 11.6 - 15.4 %   Platelets 345 150 - 450 x10E3/uL   Neutrophils 62 Not Estab. %   Lymphs 24 Not Estab. %   Monocytes 11 Not Estab. %   Eos 1 Not Estab. %   Basos 2 Not Estab. %   Neutrophils Absolute 4.3 1.4 - 7.0 x10E3/uL   Lymphocytes  Absolute 1.7 0.7 - 3.1 x10E3/uL   Monocytes Absolute 0.8 0.1 - 0.9 x10E3/uL   EOS (ABSOLUTE) 0.1 0.0 - 0.4 x10E3/uL   Basophils Absolute 0.1 0.0 - 0.2 x10E3/uL   Immature Granulocytes 0 Not Estab. %   Immature Grans (Abs) 0.0 0.0 - 0.1 x10E3/uL  Carbamazepine Level (Tegretol), total  Result Value Ref Range   Carbamazepine (Tegretol), S WILL FOLLOW   Magnesium  Result Value Ref Range   Magnesium 1.4 (L) 1.6 - 2.3 mg/dL      Assessment & Plan:   Problem List Items Addressed This Visit       Cardiovascular and Mediastinum   Hypertension    Chronic. Improved.  Still on Entresto, Metoprolol, diltalizem and Lasix.  Blood pressure is well controlled in the office.  Patient does not check blood pressures at home.  Labs ordered today.  Keep appointments with Cardiology.  Follow up in 1 month.        Relevant Medications   rosuvastatin (CRESTOR) 20 MG tablet   diltiazem (CARDIZEM CD) 360 MG 24 hr capsule   sacubitril-valsartan (ENTRESTO) 49-51 MG   Other Relevant Orders   Comp Met (CMET) (Completed)   Chronic heart failure with preserved ejection fraction (HCC)    Chronic. Has an appointment with HF clinic in February.  Recently saw Cardiology.  New onset Afib during hospitalization.  Carvedilol was changed to Metoprolol and diltalizem was added.  Placed on Enstresto during hospitalization.  Refilled medications at visit.  Labs ordered today.  Follow up in 1 month.        Relevant Medications   rosuvastatin (CRESTOR) 20 MG tablet   diltiazem (CARDIZEM CD) 360 MG 24 hr capsule   sacubitril-valsartan (ENTRESTO) 49-51 MG   Atrial fibrillation with RVR (HCC)    Chronic. Has an appointment with HF clinic in February.  Recently saw Cardiology.  New onset Afib during hospitalization.  Carvedilol was changed to Metoprolol and diltalizem was added.  Refilled medications at visit.  Labs ordered today.  Follow up in 1 month.        Relevant Medications   rosuvastatin (CRESTOR) 20 MG tablet    diltiazem (CARDIZEM CD) 360 MG 24 hr capsule   sacubitril-valsartan (ENTRESTO) 49-51 MG     Respiratory   COPD (chronic obstructive pulmonary disease) (HCC)    Chronic.  Controlled.  Continue with current medication regimen of Trellegy.  Labs ordered today.  Return to clinic in 6 months for reevaluation.  Call sooner if concerns arise.        Relevant Medications   Fluticasone-Umeclidin-Vilant (TRELEGY ELLIPTA) 100-62.5-25 MCG/ACT AEPB     Nervous and Auditory   Seizure disorder (HCC)    Chronic.  Has not seen Neurology.  Is back on tegretol which he has been on for several years.  Has not always been compliant with medication.  Tegretol level checked at visit today.  Will need to see Neurology prior to returning to work.  Follow up in 1 month.  Call sooner if concerns arise.       Relevant Orders   Carbamazepine Level (Tegretol), total (Completed)   Ambulatory referral to Neurology   Ambulatory referral to Neurology     Hematopoietic and Hemostatic   Thrombocytopenia ([place])    Labs ordered at visit today.  Will make recommendations based on lab results.         Relevant Orders   CBC w/Diff (Completed)     Other   Alcohol abuse    Chronic. Has drank very little since getting out of the hospital.  He drank due to shaking really bad.  Discussed options for treatment and resources outside of the hospital.  Patient unclear whether he will seek treatment at this time.       Hypercholesterolemia    Chronic.  Controlled.  Continue with current medication regimen.  Labs ordered today.  Return to clinic in 6 months for reevaluation.  Call sooner if concerns arise.        Relevant Medications   rosuvastatin (CRESTOR) 20 MG tablet   diltiazem (CARDIZEM CD) 360 MG 24 hr capsule   sacubitril-valsartan (ENTRESTO) 49-51 MG   Other Relevant Orders   Lipid Profile (Completed)   Prediabetes    Labs ordered at visit today.  Will make recommendations based on lab results.         Relevant Orders   HgB A1c (Completed)   Hypomagnesemia    Labs ordered at visit today.  Will make recommendations based on lab results. Completed 5 day course of magnesium during hospitalization.  Based on lab results will make recommendations about further magnesium supplement.  Follow up in 1 month.  Call sooner if concerns arise.        Relevant Orders   Magnesium (Completed)   Other Visit Diagnoses     Hospital discharge follow-up    -  Primary   Labs repeated at visit today. Patient's mom is present and making sure patient is taking medications as  prescribed. Follow up in 1 month for reevaluation.        Follow up plan: Return in about 1 month (around 12/25/2022) for HTN, HLD, DM2 FU.

## 2022-11-25 LAB — CBC WITH DIFFERENTIAL/PLATELET
Basophils Absolute: 0.1 10*3/uL (ref 0.0–0.2)
Basos: 2 %
EOS (ABSOLUTE): 0.1 10*3/uL (ref 0.0–0.4)
Eos: 1 %
Hematocrit: 37.2 % — ABNORMAL LOW (ref 37.5–51.0)
Hemoglobin: 12.3 g/dL — ABNORMAL LOW (ref 13.0–17.7)
Immature Grans (Abs): 0 10*3/uL (ref 0.0–0.1)
Immature Granulocytes: 0 %
Lymphocytes Absolute: 1.7 10*3/uL (ref 0.7–3.1)
Lymphs: 24 %
MCH: 30.1 pg (ref 26.6–33.0)
MCHC: 33.1 g/dL (ref 31.5–35.7)
MCV: 91 fL (ref 79–97)
Monocytes Absolute: 0.8 10*3/uL (ref 0.1–0.9)
Monocytes: 11 %
Neutrophils Absolute: 4.3 10*3/uL (ref 1.4–7.0)
Neutrophils: 62 %
Platelets: 345 10*3/uL (ref 150–450)
RBC: 4.09 x10E6/uL — ABNORMAL LOW (ref 4.14–5.80)
RDW: 13.6 % (ref 11.6–15.4)
WBC: 7.1 10*3/uL (ref 3.4–10.8)

## 2022-11-25 LAB — LIPID PANEL
Chol/HDL Ratio: 2.5 ratio (ref 0.0–5.0)
Cholesterol, Total: 174 mg/dL (ref 100–199)
HDL: 69 mg/dL (ref >39)
LDL Chol Calc (NIH): 94 mg/dL (ref 0–99)
Triglycerides: 56 mg/dL (ref 0–149)
VLDL Cholesterol Cal: 11 mg/dL (ref 5–40)

## 2022-11-25 LAB — COMPREHENSIVE METABOLIC PANEL
ALT: 11 IU/L (ref 0–44)
AST: 15 IU/L (ref 0–40)
Albumin/Globulin Ratio: 1.4 (ref 1.2–2.2)
Albumin: 4 g/dL (ref 3.8–4.9)
Alkaline Phosphatase: 176 IU/L — ABNORMAL HIGH (ref 44–121)
BUN/Creatinine Ratio: 23 — ABNORMAL HIGH (ref 9–20)
BUN: 17 mg/dL (ref 6–24)
Bilirubin Total: <0.2 mg/dL (ref 0.0–1.2)
CO2: 28 mmol/L (ref 20–29)
Calcium: 9.5 mg/dL (ref 8.7–10.2)
Chloride: 96 mmol/L (ref 96–106)
Creatinine, Ser: 0.73 mg/dL — ABNORMAL LOW (ref 0.76–1.27)
Globulin, Total: 2.9 g/dL (ref 1.5–4.5)
Glucose: 91 mg/dL (ref 70–99)
Potassium: 4.3 mmol/L (ref 3.5–5.2)
Sodium: 140 mmol/L (ref 134–144)
Total Protein: 6.9 g/dL (ref 6.0–8.5)
eGFR: 109 mL/min/{1.73_m2} (ref >59)

## 2022-11-25 LAB — MAGNESIUM: Magnesium: 1.4 mg/dL — ABNORMAL LOW (ref 1.6–2.3)

## 2022-11-25 LAB — HEMOGLOBIN A1C
Est. average glucose Bld gHb Est-mCnc: 123 mg/dL
Hgb A1c MFr Bld: 5.9 % — ABNORMAL HIGH (ref 4.8–5.6)

## 2022-11-25 LAB — CARBAMAZEPINE LEVEL, TOTAL: Carbamazepine (Tegretol), S: 10.6 ug/mL (ref 4.0–12.0)

## 2022-11-25 MED ORDER — MAG-OXIDE 200 MG PO TABS: 200.0000 mg | ORAL_TABLET | Freq: Two times a day (BID) | ORAL | 0 refills | Status: DC

## 2022-11-25 NOTE — Assessment & Plan Note (Signed)
Chronic. Has an appointment with HF clinic in February.  Recently saw Cardiology.  New onset Afib during hospitalization.  Carvedilol was changed to Metoprolol and diltalizem was added.  Refilled medications at visit.  Labs ordered today.  Follow up in 1 month.

## 2022-11-25 NOTE — Telephone Encounter (Signed)
Transition Care Management Unsuccessful Follow-up Telephone Call  Date of discharge and from where:  [facility] 1.12.24  Attempts:  3rd Attempt  Reason for unsuccessful TCM follow-up call:  Left voice message

## 2022-11-25 NOTE — Assessment & Plan Note (Signed)
Chronic.  Controlled.  Continue with current medication regimen.  Labs ordered today.  Return to clinic in 6 months for reevaluation.  Call sooner if concerns arise.  ? ?

## 2022-11-25 NOTE — Telephone Encounter (Signed)
Paperwork filled out and signed by provider. Called and notified patient's mother that forms are ready to be picked up.   Copy placed in scan bin, originals placed in bin for the patient to pick up.

## 2022-11-25 NOTE — Assessment & Plan Note (Signed)
Labs ordered at visit today.  Will make recommendations based on lab results.   

## 2022-11-25 NOTE — Assessment & Plan Note (Addendum)
Chronic. Improved.  Still on Entresto, Metoprolol, diltalizem and Lasix.  Blood pressure is well controlled in the office.  Patient does not check blood pressures at home.  Labs ordered today.  Keep appointments with Cardiology.  Follow up in 1 month.

## 2022-11-25 NOTE — Assessment & Plan Note (Signed)
Chronic.  Controlled.  Continue with current medication regimen of Trellegy.  Labs ordered today.  Return to clinic in 6 months for reevaluation.  Call sooner if concerns arise.

## 2022-11-25 NOTE — Assessment & Plan Note (Addendum)
Chronic. Has an appointment with HF clinic in February.  Recently saw Cardiology.  New onset Afib during hospitalization.  Carvedilol was changed to Metoprolol and diltalizem was added.  Placed on Enstresto during hospitalization.  Refilled medications at visit.  Labs ordered today.  Follow up in 1 month.

## 2022-11-25 NOTE — Assessment & Plan Note (Signed)
Chronic.  Has not seen Neurology.  Is back on tegretol which he has been on for several years.  Has not always been compliant with medication.  Tegretol level checked at visit today.  Will need to see Neurology prior to returning to work.  Follow up in 1 month.  Call sooner if concerns arise.

## 2022-11-25 NOTE — Assessment & Plan Note (Signed)
Labs ordered at visit today.  Will make recommendations based on lab results. Completed 5 day course of magnesium during hospitalization.  Based on lab results will make recommendations about further magnesium supplement.  Follow up in 1 month.  Call sooner if concerns arise.

## 2022-11-25 NOTE — Assessment & Plan Note (Signed)
Chronic. Has drank very little since getting out of the hospital.  He drank due to shaking really bad.  Discussed options for treatment and resources outside of the hospital.  Patient unclear whether he will seek treatment at this time.

## 2022-11-25 NOTE — Progress Notes (Signed)
Please let patient know that his lab work looks good.  Magnesium remains low.  I have sent in Magnesium '200mg'$  twice daily to the pharmacy for him to take.  We will recheck this at his next visit.  Anemia is improving.  A1c remains well controlled in the prediabetic range. Liver enzymes have returned to the normal range.  Make sure to drink plenty of water and stay well hydrated.

## 2022-11-26 DIAGNOSIS — F10920 Alcohol use, unspecified with intoxication, uncomplicated: Secondary | ICD-10-CM | POA: Diagnosis not present

## 2022-11-26 DIAGNOSIS — I5032 Chronic diastolic (congestive) heart failure: Secondary | ICD-10-CM | POA: Diagnosis not present

## 2022-11-26 DIAGNOSIS — D500 Iron deficiency anemia secondary to blood loss (chronic): Secondary | ICD-10-CM | POA: Diagnosis not present

## 2022-11-26 DIAGNOSIS — I509 Heart failure, unspecified: Secondary | ICD-10-CM | POA: Diagnosis not present

## 2022-11-29 ENCOUNTER — Other Ambulatory Visit
Admission: RE | Admit: 2022-11-29 | Discharge: 2022-11-29 | Disposition: A | Discharge status: Home / Self Care | Payer: BC Managed Care – PPO | Attending: Internal Medicine | Admitting: Internal Medicine

## 2022-11-29 DIAGNOSIS — I5032 Chronic diastolic (congestive) heart failure: Secondary | ICD-10-CM | POA: Insufficient documentation

## 2022-11-29 LAB — BASIC METABOLIC PANEL
Anion gap: 10 (ref 5–15)
BUN: 19 mg/dL (ref 6–20)
CO2: 26 mmol/L (ref 22–32)
Calcium: 8.6 mg/dL — ABNORMAL LOW (ref 8.9–10.3)
Chloride: 101 mmol/L (ref 98–111)
Creatinine, Ser: 0.84 mg/dL (ref 0.61–1.24)
GFR, Estimated: >60 mL/min (ref >60)
Glucose, Bld: 96 mg/dL (ref 70–99)
Potassium: 3.7 mmol/L (ref 3.5–5.1)
Sodium: 137 mmol/L (ref 135–145)

## 2022-11-29 NOTE — Addendum Note (Signed)
Addended by: Marty Heck on: 11/29/2022 01:43 PM   Modules accepted: Orders

## 2022-12-13 ENCOUNTER — Encounter: Payer: BC Managed Care – PPO | Admitting: Cardiology

## 2022-12-14 ENCOUNTER — Encounter: Payer: BC Managed Care – PPO | Admitting: Cardiology

## 2022-12-16 ENCOUNTER — Encounter: Payer: BC Managed Care – PPO | Admitting: Cardiology

## 2022-12-19 ENCOUNTER — Telehealth: Payer: Self-pay | Admitting: *Deleted

## 2022-12-19 ENCOUNTER — Encounter: Payer: Self-pay | Admitting: Cardiology

## 2022-12-19 ENCOUNTER — Ambulatory Visit (HOSPITAL_BASED_OUTPATIENT_CLINIC_OR_DEPARTMENT_OTHER): Payer: BC Managed Care – PPO | Admitting: Cardiology

## 2022-12-19 ENCOUNTER — Other Ambulatory Visit: Payer: Self-pay | Admitting: *Deleted

## 2022-12-19 ENCOUNTER — Other Ambulatory Visit
Admission: RE | Admit: 2022-12-19 | Discharge: 2022-12-19 | Disposition: A | Discharge status: Home / Self Care | Payer: BC Managed Care – PPO | Source: Ambulatory Visit | Attending: Cardiology | Admitting: Cardiology

## 2022-12-19 ENCOUNTER — Other Ambulatory Visit (HOSPITAL_COMMUNITY): Payer: Self-pay

## 2022-12-19 VITALS — BP 130/70 | HR 85 | Wt 202.4 lb

## 2022-12-19 DIAGNOSIS — I5022 Chronic systolic (congestive) heart failure: Secondary | ICD-10-CM | POA: Insufficient documentation

## 2022-12-19 DIAGNOSIS — I110 Hypertensive heart disease with heart failure: Secondary | ICD-10-CM | POA: Insufficient documentation

## 2022-12-19 DIAGNOSIS — Z8249 Family history of ischemic heart disease and other diseases of the circulatory system: Secondary | ICD-10-CM | POA: Insufficient documentation

## 2022-12-19 DIAGNOSIS — E8770 Fluid overload, unspecified: Secondary | ICD-10-CM | POA: Insufficient documentation

## 2022-12-19 DIAGNOSIS — Z7901 Long term (current) use of anticoagulants: Secondary | ICD-10-CM | POA: Insufficient documentation

## 2022-12-19 DIAGNOSIS — I4819 Other persistent atrial fibrillation: Secondary | ICD-10-CM

## 2022-12-19 DIAGNOSIS — D6959 Other secondary thrombocytopenia: Secondary | ICD-10-CM | POA: Insufficient documentation

## 2022-12-19 DIAGNOSIS — I5032 Chronic diastolic (congestive) heart failure: Secondary | ICD-10-CM | POA: Insufficient documentation

## 2022-12-19 DIAGNOSIS — Z79899 Other long term (current) drug therapy: Secondary | ICD-10-CM | POA: Insufficient documentation

## 2022-12-19 DIAGNOSIS — I480 Paroxysmal atrial fibrillation: Secondary | ICD-10-CM | POA: Diagnosis not present

## 2022-12-19 DIAGNOSIS — F1010 Alcohol abuse, uncomplicated: Secondary | ICD-10-CM | POA: Insufficient documentation

## 2022-12-19 DIAGNOSIS — J449 Chronic obstructive pulmonary disease, unspecified: Secondary | ICD-10-CM | POA: Insufficient documentation

## 2022-12-19 DIAGNOSIS — Z7984 Long term (current) use of oral hypoglycemic drugs: Secondary | ICD-10-CM | POA: Insufficient documentation

## 2022-12-19 DIAGNOSIS — E785 Hyperlipidemia, unspecified: Secondary | ICD-10-CM | POA: Insufficient documentation

## 2022-12-19 LAB — BASIC METABOLIC PANEL
Anion gap: 17 — ABNORMAL HIGH (ref 5–15)
BUN: 43 mg/dL — ABNORMAL HIGH (ref 6–20)
CO2: 20 mmol/L — ABNORMAL LOW (ref 22–32)
Calcium: 9.4 mg/dL (ref 8.9–10.3)
Chloride: 105 mmol/L (ref 98–111)
Creatinine, Ser: 0.78 mg/dL (ref 0.61–1.24)
GFR, Estimated: >60 mL/min (ref >60)
Glucose, Bld: 107 mg/dL — ABNORMAL HIGH (ref 70–99)
Potassium: 4.2 mmol/L (ref 3.5–5.1)
Sodium: 142 mmol/L (ref 135–145)

## 2022-12-19 LAB — CBC
HCT: 39.8 % (ref 39.0–52.0)
Hemoglobin: 13.4 g/dL (ref 13.0–17.0)
MCH: 30.1 pg (ref 26.0–34.0)
MCHC: 33.7 g/dL (ref 30.0–36.0)
MCV: 89.4 fL (ref 80.0–100.0)
Platelets: 85 10*3/uL — ABNORMAL LOW (ref 150–400)
RBC: 4.45 MIL/uL (ref 4.22–5.81)
RDW: 16.4 % — ABNORMAL HIGH (ref 11.5–15.5)
WBC: 6.1 10*3/uL (ref 4.0–10.5)
nRBC: 0 % (ref 0.0–0.2)

## 2022-12-19 LAB — BRAIN NATRIURETIC PEPTIDE: B Natriuretic Peptide: 62.3 pg/mL (ref 0.0–100.0)

## 2022-12-19 MED ORDER — ROSUVASTATIN CALCIUM 20 MG PO TABS: 20.0000 mg | ORAL_TABLET | Freq: Every day | ORAL | 1 refills | Status: DC

## 2022-12-19 MED ORDER — DILTIAZEM HCL ER COATED BEADS 360 MG PO CP24: 360.0000 mg | ORAL_CAPSULE | Freq: Every day | ORAL | 1 refills | Status: DC

## 2022-12-19 MED ORDER — WARFARIN SODIUM 5 MG PO TABS: 5.0000 mg | ORAL_TABLET | ORAL | 3 refills | Status: DC

## 2022-12-19 MED ORDER — FUROSEMIDE 40 MG PO TABS: ORAL_TABLET | ORAL | 3 refills | Status: DC

## 2022-12-19 MED ORDER — POTASSIUM CHLORIDE ER 10 MEQ PO CPCR: 20.0000 meq | ORAL_CAPSULE | Freq: Every day | ORAL | 3 refills | Status: DC

## 2022-12-19 MED ORDER — MAG-OXIDE 200 MG PO TABS: 200.0000 mg | ORAL_TABLET | Freq: Two times a day (BID) | ORAL | 1 refills | Status: DC

## 2022-12-19 MED ORDER — METOPROLOL TARTRATE 25 MG PO TABS: 75.0000 mg | ORAL_TABLET | Freq: Two times a day (BID) | ORAL | 3 refills | Status: DC

## 2022-12-19 NOTE — Progress Notes (Signed)
ADVANCED HEART FAILURE CLINIC NOTE  Referring Physician: Jon Billings, NP  Primary Care: Jon Billings, NP HF Cardiology: Dr. Daniel Nones  HPI: Jose [name]. is a 52 y.o. male with hypertension, hyperlipidemia, COPD, significant alcohol abuse, history of seizures, persistent atrial fibrillation.  Patient was admitted in 1/24 with fevers chills cough and fatigue.  He presented hypotensive to 60s/40s with new onset atrial fibrillation with RVR. TTE during admit with LVEF of 50-55%, moderate biatrial enlargement, normal RV. He was seen by neurology for possible alcohol withdrawal related seizures and continued on home tegretol (undetectable levels on admit). For his atrial fibrillation he was discharged home on cardizem & lopressor. Anticoagulation was not started due to concern for continued alcohol abuse and thrombocytopenia.   At last appointment, patient appeared intoxicated. Patient returns for followup of CHF and atrial fibrillation with his mother.  Weight is down 4 lbs.  He is in rapid atrial fibrillation today but does not feel it.  He continues to drink but per his mother and him, has cut back.  He has had no falls and denies lightheadedness. He is out of both metoprolol and diltiazem CD.  He is short of breath walking "long distances" and lifting heavy objects.  Generally does ok walking around the house.  He does have orthopnea and sleeps on a thick pillow.  No chest pain.    ECG (personally reviewed): Atrial fibrillation with RVR  Labs (1/24): K 3.7, creatinine 0.84, plts 345, hgb 12.3  Past Medical History:  Diagnosis Date   Alcohol abuse    Cervicalgia    CHF (congestive heart failure) (HCC)    COPD (chronic obstructive pulmonary disease) (HCC)    Hyperlipidemia    Hypertension    Seizures (HCC)     Current Outpatient Medications  Medication Sig Dispense Refill   albuterol (VENTOLIN HFA) 108 (90 Base) MCG/ACT inhaler Inhale 2 puffs into the lungs every 6 (six)  hours as needed for wheezing or shortness of breath. 8 g 2   carbamazepine (TEGRETOL) 200 MG tablet Take 1 tablet (200 mg total) by mouth 2 (two) times daily. 180 tablet 1   empagliflozin (JARDIANCE) 10 MG TABS tablet Take 1 tablet (10 mg total) by mouth daily before breakfast. 90 tablet 1   Fluticasone-Umeclidin-Vilant (TRELEGY ELLIPTA) 100-62.5-25 MCG/ACT AEPB Inhale 1 puff into the lungs daily. 14 each 0   Iron, Ferrous Sulfate, 325 (65 Fe) MG TABS Take 325 mg by mouth daily. 90 tablet 1   LORazepam (ATIVAN) 1 MG tablet Take 1 tablet (1 mg total) by mouth every 6 (six) hours as needed for anxiety. 10 tablet 0   sacubitril-valsartan (ENTRESTO) 49-51 MG Take 1 tablet by mouth 2 (two) times daily. 180 tablet 1   spironolactone (ALDACTONE) 25 MG tablet Take 1 tablet (25 mg total) by mouth daily. 30 tablet 3   warfarin (COUMADIN) 5 MG tablet Take 1 tablet (5 mg total) by mouth as directed. 30 tablet 3   diltiazem (CARDIZEM CD) 360 MG 24 hr capsule Take 1 capsule (360 mg total) by mouth daily. 90 capsule 1   furosemide (LASIX) 40 MG tablet Take 1 tablet (40 mg total) by mouth every morning AND 0.5 tablets (20 mg total) every evening. 45 tablet 3   Magnesium Oxide -Mg Supplement (MAG-OXIDE) 200 MG TABS Take 1 tablet (200 mg total) by mouth 2 (two) times daily. 180 tablet 1   metoprolol tartrate (LOPRESSOR) 25 MG tablet Take 3 tablets (75 mg total) by mouth  2 (two) times daily. 180 tablet 3   potassium chloride (MICRO-K) 10 MEQ CR capsule Take 2 capsules (20 mEq total) by mouth daily. 60 capsule 3   rosuvastatin (CRESTOR) 20 MG tablet Take 1 tablet (20 mg total) by mouth daily. 90 tablet 1   No current facility-administered medications for this visit.    No Known Allergies    Social History   Socioeconomic History   Marital status: Widowed    Spouse name: Not on file   Number of children: Not on file   Years of education: Not on file   Highest education level: Not on file  Occupational  History   Not on file  Tobacco Use   Smoking status: Every Day    Types: Cigars    Start date: 2020   Smokeless tobacco: Former   Tobacco comments:    2 cigars a day-05/05/2022    Quit smoking cigarettes 3 years ago.  Smokes cigarettes since he was 52 years old.  Vaping Use   Vaping Use: Never used  Substance and Sexual Activity   Alcohol use: Yes    Alcohol/week: 28.0 standard drinks of alcohol    Types: 7 Cans of beer, 21 Shots of liquor per week    Comment: pint of liquor per day   Drug use: No    Types: Marijuana    Comment: quit back in the late 90's   Sexual activity: Yes  Other Topics Concern   Not on file  Social History Narrative   Not on file   Social Determinants of Health   Financial Resource Strain: Low Risk  (07/01/2019)   Overall Financial Resource Strain (CARDIA)    Difficulty of Paying Living Expenses: Not hard at all  Food Insecurity: No Food Insecurity (11/11/2022)   Hunger Vital Sign    Worried About Running Out of Food in the Last Year: Never true    Ran Out of Food in the Last Year: Never true  Transportation Needs: No Transportation Needs (11/11/2022)   PRAPARE - Hydrologist (Medical): No    Lack of Transportation (Non-Medical): No  Physical Activity: Sufficiently Active (07/01/2019)   Exercise Vital Sign    Days of Exercise per Week: 3 days    Minutes of Exercise per Session: 50 min  Stress: No Stress Concern Present (09/02/2019)   [place]    Feeling of Stress : Not at all  Social Connections: Moderately Isolated (07/01/2019)   Social Connection and Isolation Panel [NHANES]    Frequency of Communication with Friends and Family: Three times a week    Frequency of Social Gatherings with Friends and Family: Three times a week    Attends Religious Services: 1 to 4 times per year    Active Member of Clubs or Organizations: No    Attends Archivist  Meetings: Never    Marital Status: Widowed  Intimate Partner Violence: Not At Risk (11/11/2022)   Humiliation, Afraid, Rape, and Kick questionnaire    Fear of Current or Ex-Partner: No    Emotionally Abused: No    Physically Abused: No    Sexually Abused: No      Family History  Problem Relation Age of Onset   Hypertension Mother    Diabetes Mother    Colon cancer Mother    Hypertension Father    Multiple sclerosis Father     PHYSICAL EXAM: Vitals:   12/19/22 1449  BP: 130/70  Pulse: 85  SpO2: 98%   General: NAD Neck: JVP 8-9 cm, no thyromegaly or thyroid nodule.  Lungs: Clear to auscultation bilaterally with normal respiratory effort. CV: Nondisplaced PMI.  Heart tachy, irregular S1/S2, no S3/S4, no murmur.  Trace ankle edema.  No carotid bruit.  Normal pedal pulses.  Abdomen: Soft, nontender, no hepatosplenomegaly, no distention.  Skin: Intact without lesions or rashes.  Neurologic: Alert and oriented x 3.  Psych: Normal affect. Extremities: No clubbing or cyanosis.  HEENT: Normal.   ECHO: 11/11/22: LVEF 50-55%, normal RV function 11/05/21: LVEF 60-65%, normal RV function   ASSESSMENT & PLAN:  1.  Heart failure with mildly reduced EF: Echo in 1/24 with EF 50-55%, normal RV in setting of atrial fibrillation with RVR.  Patient remains in AF with RVR today.  He has been out of metoprolol and diltiazem CD. He remains mildly volume overloaded with NYHA class III symptoms.  Stable creatinine and BP.  - Increase Lasix to 40 qam, 20 qpm and increase KCl to 20 daily.  BMET/BNP today and repeat in 10 days.  - Continue Jardiance 10 mg daily.  - Continue Entresto 49/51 bid.  - Needs conversion back to NSR then repeat echo.  2. Atrial fibrillation: Persistent with RVR today.  He was not started on anticoagulation during 1/24 admission due to ETOH abuse, thrombocytopenia, and concerns about fall risk and ability to take anticoagulation consistently.   He does report cutting back on  ETOH and has not had any falls.  Most recent CBC showed platelets up to 345K (significant rise).  We had a long talk about anticoagulation.  He and his mother think that he could be compliant with it and he understands the need to cut back gradually on ETOH intake (he has been doing this).  Unfortunately, he is on Tegretol so cannot take Eliquis, Xarelto, or Pradaxa.  GFR is too high for edoxaban.  - I am going to start him on warfarin today as long as platelets remain stable on repeat CBC today.  - He needs to restart diltiazem CD 360 mg daily and metoprolol 75 mg bid today for rate control.  He ran out recently.  - I am going to bring him back in a week or 2 once he is on warfarin for a TEE-DCCV with Dr. Daniel Nones to get him back into NSR. I discussed risks/benefits with patient and mother extensively today and they agree to procedure.  - Needs to continue to cut back on ETOH or AF will recur after DCCV.   3. Alcohol abuse: Long history of alcohol abuse. He has not quit drinking but is cutting back.  - Discussed imperative to continue cutting back ETOH in order to successfully take anticoagulation and get back into NSR.  4. Thrombocytopenia: ?Related to ETOH abuse.  Last CBC with normal platelets, significant change.  - Repeat CBC today to ensure that plts remain up.   Toren Sasser 12/19/2022 5:29 PM

## 2022-12-19 NOTE — Telephone Encounter (Signed)
Received message, pt will need new coumadin appointment. Pt was started on warfarin today (46m) at Dr. MClaris Gladd[place]office. Pt is schedueld for cardioversion on 12/27/2022.   Called pt and LMOM.

## 2022-12-19 NOTE — Patient Instructions (Addendum)
Medication Changes:  RESTART Diltiazem and Metoprolol ASAP (refills have been sent to [facility])  START Coumadin 5 mg Daily  INCREASE Furosemide to 40 mg (1 tab) in AM and 20 mg (1/2 tab) in PM  INCREASE Potassium to 20 meq (2 tabs) Daily  Lab Work:  Labs done today, your results will be available in MyChart, we will contact you for abnormal readings.  Testing/Procedures:  Your physician has requested that you have a TEE/Cardioversion. During a TEE, sound waves are used to create images of your heart. It provides your doctor with information about the size and shape of your heart and how well your heart's chambers and valves are working. In this test, a transducer is attached to the end of a flexible tube that is guided down you throat and into your esophagus (the tube leading from your mouth to your stomach) to get a more detailed image of your heart. Once the TEE has determined that a blood clot is not present, the cardioversion begins. Electrical Cardioversion uses a jolt of electricity to your heart either through paddles or wired patches attached to your chest. This is a controlled, usually prescheduled, procedure. This procedure is done at the hospital and you are not awake during the procedure. You usually go home the day of the procedure. Please see the instruction sheet given to you today for more information. SEE INSTRUCTIONS BELOW  Referrals:  You have been referred to Coumadin Clinic  Special Instructions // Education:  [place] the following things EVERYDAY: Weigh yourself in the morning before breakfast. Write it down and keep it in a log. Take your medicines as prescribed Eat low salt foods--Limit salt (sodium) to 2000 mg per day.  Stay as active as you can everyday Limit all fluids for the day to less than 2 liters  Take your medications as prescribed!!!  You are scheduled for a Cardioversion on ____Tuesday 2/20/24______ with  Dr._Bensimhon____  Please arrive at the [place] [facility] at __6:30_ a.m. on the day of your procedure.  DIET INSTRUCTIONS:  Nothing to eat or drink after midnight except your medications with a sip of water.  Labs: ____done today______________  Medications:  YOU MAY TAKE ALL of your remaining medications with a small amount of water.  Must have a responsible person to drive you home.  Bring a current list of your medications and current insurance cards.    If you have any questions after you get home, please call the office at [phone]   Follow-Up in: 2 weeks    If you have any questions or concerns before your next appointment please send [place] a message through mychart or call our office at [phone] Monday-Friday 8 am-5 pm.   If you have an urgent need after hours on the weekend please call your Primary Cardiologist or the [facility] in [place] at [phone].

## 2022-12-20 ENCOUNTER — Telehealth: Payer: Self-pay | Admitting: *Deleted

## 2022-12-20 NOTE — Telephone Encounter (Signed)
Called and spoke to pt who stated that he will not be able to pick up warfarin until today.   Informed him that warfarin is a daily medication that requires INR monitoring. Weekly for the 1st 4 weeks.   Pt is scheduled for Cardioversion on 12/27/2022.   Coumadin nurse in [place] is only there on Wednesdays. Scheduled pt to come in to have INR check on 12/28/2022 at 2:30.

## 2022-12-22 NOTE — Telephone Encounter (Signed)
Spoke w/pt's mother, he did p/u and started meds including coumadin. Will resch tee/dccv and call them back with date/time

## 2022-12-22 NOTE — Telephone Encounter (Signed)
Per Dr Daniel Nones, tee/dccv for 2/20 has been cancelled for now, will resch to the following week if compliant with meds. Attempted to call pt and Left message to call back

## 2022-12-26 ENCOUNTER — Encounter: Payer: Self-pay | Admitting: Nurse Practitioner

## 2022-12-26 ENCOUNTER — Ambulatory Visit: Payer: BC Managed Care – PPO | Admitting: Nurse Practitioner

## 2022-12-26 DIAGNOSIS — G40909 Epilepsy, unspecified, not intractable, without status epilepticus: Secondary | ICD-10-CM | POA: Diagnosis not present

## 2022-12-26 DIAGNOSIS — I4819 Other persistent atrial fibrillation: Secondary | ICD-10-CM

## 2022-12-26 MED ORDER — MAG-OXIDE 200 MG PO TABS: 200.0000 mg | ORAL_TABLET | Freq: Two times a day (BID) | ORAL | 1 refills | Status: DC

## 2022-12-26 NOTE — Progress Notes (Signed)
BP 101/67   Pulse (!) 153   Temp 98.1 F (36.7 C) (Oral)   Ht 5' 10"$  (1.778 m)   Wt 197 lb 8 oz (89.6 kg)   SpO2 97%   BMI 28.34 kg/m    Subjective:    Patient ID: Jose Dakins., male    DOB: 1971-04-07, 52 y.o.   MRN: [id number]  HPI: Jose Bossert. is a 52 y.o. male  Chief Complaint  Patient presents with   Hyperlipidemia   Hypertension   Diabetes   Medication Refill    Patient is requesting a refill on Trelegy prescription.     HYPERTENSION / HYPERLIPIDEMIA/HF Patient has a TEE on 01/12/23.   Satisfied with current treatment? yes Duration of hypertension: years BP monitoring frequency: not checking BP range:  BP medication side effects: no Past BP meds:  Lasix, amlodipine, carvedilol, and lisinopril Duration of hyperlipidemia: years Cholesterol medication side effects: no Cholesterol supplements: none Past cholesterol medications: rosuvastatin (crestor) Medication compliance: excellent compliance Aspirin: no Recent stressors: no Recurrent headaches: no Visual changes: no Palpitations: no Dyspnea: yes Chest pain: no Lower extremity edema: no Dizzy/lightheaded: no  Patient has an appointment with Neurology on March 5.    ALCOHOL USE Drinking about a little less than 1 pint daily.     Relevant past medical, surgical, family and social history reviewed and updated as indicated. Interim medical history since our last visit reviewed. Allergies and medications reviewed and updated.  Review of Systems  Eyes:  Negative for visual disturbance.  Respiratory:  Positive for shortness of breath. Negative for chest tightness.   Cardiovascular:  Negative for chest pain, palpitations and leg swelling.  Neurological:  Negative for dizziness, light-headedness and headaches.    Per HPI unless specifically indicated above     Objective:    BP 101/67   Pulse (!) 153   Temp 98.1 F (36.7 C) (Oral)   Ht 5' 10"$  (1.778 m)   Wt 197 lb 8 oz (89.6 kg)   SpO2 97%    BMI 28.34 kg/m   Wt Readings from Last 3 Encounters:  12/26/22 197 lb 8 oz (89.6 kg)  12/19/22 202 lb 6.4 oz (91.8 kg)  11/24/22 203 lb 4.8 oz (92.2 kg)    Physical Exam Vitals and nursing note reviewed.  Constitutional:      General: He is not in acute distress.    Appearance: Normal appearance. He is not ill-appearing, toxic-appearing or diaphoretic.  HENT:     Head: Normocephalic.     Right Ear: External ear normal.     Left Ear: External ear normal.     Nose: Nose normal. No congestion or rhinorrhea.     Mouth/Throat:     Mouth: Mucous membranes are moist.  Eyes:     General:        Right eye: No discharge.        Left eye: No discharge.     Extraocular Movements: Extraocular movements intact.     Conjunctiva/sclera: Conjunctivae normal.     Pupils: Pupils are equal, round, and reactive to light.  Cardiovascular:     Rate and Rhythm: Normal rate. Rhythm irregular.     Heart sounds: No murmur heard. Pulmonary:     Effort: Pulmonary effort is normal. No respiratory distress.     Breath sounds: Normal breath sounds. No wheezing, rhonchi or rales.  Abdominal:     General: Abdomen is flat. Bowel sounds are normal.  Musculoskeletal:  Cervical back: Normal range of motion and neck supple.  Skin:    General: Skin is warm and dry.     Capillary Refill: Capillary refill takes less than 2 seconds.  Neurological:     General: No focal deficit present.     Mental Status: He is alert and oriented to person, place, and time.  Psychiatric:        Mood and Affect: Mood normal.        Behavior: Behavior normal.        Thought Content: Thought content normal.        Judgment: Judgment normal.     Results for orders placed or performed during the hospital encounter of 12/19/22  Brain natriuretic peptide  Result Value Ref Range   B Natriuretic Peptide 62.3 0.0 - 100.0 pg/mL  CBC  Result Value Ref Range   WBC 6.1 4.0 - 10.5 K/uL   RBC 4.45 4.22 - 5.81 MIL/uL   Hemoglobin  13.4 13.0 - 17.0 g/dL   HCT 39.8 39.0 - 52.0 %   MCV 89.4 80.0 - 100.0 fL   MCH 30.1 26.0 - 34.0 pg   MCHC 33.7 30.0 - 36.0 g/dL   RDW 16.4 (H) 11.5 - 15.5 %   Platelets 85 (L) 150 - 400 K/uL   nRBC 0.0 0.0 - 0.2 %  Basic metabolic panel  Result Value Ref Range   Sodium 142 135 - 145 mmol/L   Potassium 4.2 3.5 - 5.1 mmol/L   Chloride 105 98 - 111 mmol/L   CO2 20 (L) 22 - 32 mmol/L   Glucose, Bld 107 (H) 70 - 99 mg/dL   BUN 43 (H) 6 - 20 mg/dL   Creatinine, Ser 0.78 0.61 - 1.24 mg/dL   Calcium 9.4 8.9 - 10.3 mg/dL   GFR, Estimated >60 >60 mL/min   Anion gap 17 (H) 5 - 15      Assessment & Plan:   Problem List Items Addressed This Visit       Cardiovascular and Mediastinum   Persistent atrial fibrillation (HCC)    Chronic.  Not well controlled.  Irregular in office today.  Has TEE scheduled on March 7.  On Coumadin for anticoagulation.  Has INR check on Wednesday.  Followed by Cardiology.        Relevant Medications   Magnesium Oxide -Mg Supplement (MAG-OXIDE) 200 MG TABS     Nervous and Auditory   Seizure disorder (HCC)    Chronic.  Has an appt with Neurology on March 5.  Will need to be evaluated by them prior to returning to work.         Other   Hypomagnesemia - Primary    Ongoing.  Will recheck mag level in office today.  Refilled Magnesium at visit today.        Relevant Orders   Magnesium     Follow up plan: Return in about 2 months (around 02/24/2023) for HTN, HLD, DM2 FU.

## 2022-12-26 NOTE — Assessment & Plan Note (Signed)
Chronic.  Has an appt with Neurology on March 5.  Will need to be evaluated by them prior to returning to work.

## 2022-12-26 NOTE — Assessment & Plan Note (Signed)
Ongoing.  Will recheck mag level in office today.  Refilled Magnesium at visit today.

## 2022-12-26 NOTE — Assessment & Plan Note (Signed)
Chronic.  Not well controlled.  Irregular in office today.  Has TEE scheduled on March 7.  On Coumadin for anticoagulation.  Has INR check on Wednesday.  Followed by Cardiology.

## 2022-12-27 ENCOUNTER — Ambulatory Visit: Admit: 2022-12-27 | Payer: BC Managed Care – PPO | Admitting: Internal Medicine

## 2022-12-27 DIAGNOSIS — F10920 Alcohol use, unspecified with intoxication, uncomplicated: Secondary | ICD-10-CM | POA: Diagnosis not present

## 2022-12-27 DIAGNOSIS — I509 Heart failure, unspecified: Secondary | ICD-10-CM | POA: Diagnosis not present

## 2022-12-27 DIAGNOSIS — D500 Iron deficiency anemia secondary to blood loss (chronic): Secondary | ICD-10-CM | POA: Diagnosis not present

## 2022-12-27 DIAGNOSIS — I5032 Chronic diastolic (congestive) heart failure: Secondary | ICD-10-CM | POA: Diagnosis not present

## 2022-12-27 LAB — MAGNESIUM: Magnesium: 1.4 mg/dL — ABNORMAL LOW (ref 1.6–2.3)

## 2022-12-27 SURGERY — ECHOCARDIOGRAM, TRANSESOPHAGEAL
Anesthesia: General

## 2022-12-27 NOTE — Progress Notes (Signed)
Appointment has been made

## 2022-12-27 NOTE — Progress Notes (Signed)
Please let patient know that his magnesium is still low.  He needs to restart the supplement we discussed during the visit yesterday.

## 2022-12-28 ENCOUNTER — Ambulatory Visit: Payer: BC Managed Care – PPO | Attending: Cardiovascular Disease | Admitting: Pharmacist

## 2022-12-28 DIAGNOSIS — Z7901 Long term (current) use of anticoagulants: Secondary | ICD-10-CM | POA: Insufficient documentation

## 2022-12-28 DIAGNOSIS — I4819 Other persistent atrial fibrillation: Secondary | ICD-10-CM

## 2022-12-28 LAB — POCT INR: INR: 3.1 — AB (ref 2.0–3.0)

## 2022-12-28 NOTE — Patient Instructions (Addendum)
Description   Continue taking 1 tablet (30m) once a day in the evening.  Keep your intake of greens consistent  Please call the Coumadin Clinic with any questions at [phone]

## 2022-12-29 ENCOUNTER — Ambulatory Visit: Payer: BC Managed Care – PPO | Admitting: Family

## 2022-12-29 ENCOUNTER — Ambulatory Visit (HOSPITAL_BASED_OUTPATIENT_CLINIC_OR_DEPARTMENT_OTHER): Payer: BC Managed Care – PPO | Admitting: Family

## 2022-12-29 ENCOUNTER — Encounter: Payer: Self-pay | Admitting: Family

## 2022-12-29 ENCOUNTER — Other Ambulatory Visit
Admission: RE | Admit: 2022-12-29 | Discharge: 2022-12-29 | Disposition: A | Discharge status: Home / Self Care | Payer: BC Managed Care – PPO | Source: Ambulatory Visit | Attending: Family | Admitting: Family

## 2022-12-29 VITALS — BP 95/74 | HR 79 | Resp 18 | Wt 197.0 lb

## 2022-12-29 DIAGNOSIS — I5032 Chronic diastolic (congestive) heart failure: Secondary | ICD-10-CM | POA: Diagnosis not present

## 2022-12-29 DIAGNOSIS — I4819 Other persistent atrial fibrillation: Secondary | ICD-10-CM | POA: Diagnosis not present

## 2022-12-29 DIAGNOSIS — I10 Essential (primary) hypertension: Secondary | ICD-10-CM | POA: Diagnosis not present

## 2022-12-29 DIAGNOSIS — J449 Chronic obstructive pulmonary disease, unspecified: Secondary | ICD-10-CM | POA: Diagnosis not present

## 2022-12-29 DIAGNOSIS — F1010 Alcohol abuse, uncomplicated: Secondary | ICD-10-CM

## 2022-12-29 LAB — BASIC METABOLIC PANEL
Anion gap: 15 (ref 5–15)
BUN: 65 mg/dL — ABNORMAL HIGH (ref 6–20)
CO2: 19 mmol/L — ABNORMAL LOW (ref 22–32)
Calcium: 8.9 mg/dL (ref 8.9–10.3)
Chloride: 99 mmol/L (ref 98–111)
Creatinine, Ser: 1.49 mg/dL — ABNORMAL HIGH (ref 0.61–1.24)
GFR, Estimated: 56 mL/min — ABNORMAL LOW (ref >60)
Glucose, Bld: 87 mg/dL (ref 70–99)
Potassium: 4.6 mmol/L (ref 3.5–5.1)
Sodium: 133 mmol/L — ABNORMAL LOW (ref 135–145)

## 2022-12-29 LAB — BRAIN NATRIURETIC PEPTIDE: B Natriuretic Peptide: 58.2 pg/mL (ref 0.0–100.0)

## 2022-12-29 NOTE — Progress Notes (Signed)
Patient ID: Jose [name]., male    DOB: May 07, 1971, 52 y.o.   MRN: [id number]  [name] is a 52 y/o male with a history of hyperlipidemia, HTN, current tobacco/ alcohol use and chronic heart failure.   Echo 11/11/22 showed an EF of 50-55% along with mild LVH, mildly elevated PA pressure, moderate LAE/RAE and trivial TR. Echo report from 11/05/21 reviewed and showed an EF of 60-65% along with moderate LVH. Echo report from 01/29/2019 reviewed and showed an EF of 60-65%  Admitted 11/10/22 due to seizures. Found to be in AF. Was in the ED 06/27/22 due to COPD exacerbation and alcohol intoxication where he was evaluated and released.   He presents today for a HF follow-up visit with a chief complaint of minimal SOB with moderate exertion. Describes this as chronic in nature. Has associated wheezing, pedal edema (improving), palpitations, right knee pain and difficulty sleeping along with this. Denies any abdominal distention, chest pain, cough, fatigue, dizziness or weight gain.   Continues to drink although says that he's trying to reduce the amount. Voices frustration with having to urinate so much at bedtime. Says that he takes his morning diuretic ~ 7am and then takes the 2nd dose ~ 6-7pm because he and his mom thought it had to be separated by 12 hours. Advised that he could take the PM dose early afternoon (~ 2pm)  Past Medical History:  Diagnosis Date   Alcohol abuse    Cervicalgia    CHF (congestive heart failure) (HCC)    COPD (chronic obstructive pulmonary disease) (HCC)    Hyperlipidemia    Hypertension    Seizures ([place])    Past Surgical History:  Procedure Laterality Date   CHOLECYSTECTOMY     COLONOSCOPY WITH PROPOFOL N/A 01/16/2018   Procedure: COLONOSCOPY WITH PROPOFOL;  Surgeon: Lin Landsman, MD;  Location: [facility] ENDOSCOPY;  Service: Gastroenterology;  Laterality: N/A;   ESOPHAGOGASTRODUODENOSCOPY (EGD) WITH PROPOFOL N/A 01/16/2018   Procedure: ESOPHAGOGASTRODUODENOSCOPY (EGD)  WITH PROPOFOL;  Surgeon: Lin Landsman, MD;  Location[facility] ENDOSCOPY;  Service: Gastroenterology;  Laterality: N/A;   TONSILLECTOMY     Family History  Problem Relation Age of Onset   Hypertension Mother    Diabetes Mother    Colon cancer Mother    Hypertension Father    Multiple sclerosis Father    Social History   Tobacco Use   Smoking status: Every Day    Types: Cigars    Start date: 2020   Smokeless tobacco: Former   Tobacco comments:    2 cigars a day-05/05/2022    Quit smoking cigarettes 3 years ago.  Smokes cigarettes since he was 52 years old.  Substance Use Topics   Alcohol use: Yes    Alcohol/week: 28.0 standard drinks of alcohol    Types: 7 Cans of beer, 21 Shots of liquor per week    Comment: pint of liquor per day   No Known Allergies  Prior to Admission medications   Medication Sig Start Date End Date Taking? Authorizing Provider  albuterol (VENTOLIN HFA) 108 (90 Base) MCG/ACT inhaler Inhale 2 puffs into the lungs every 6 (six) hours as needed for wheezing or shortness of breath. 06/27/22  Yes Lucillie Garfinkel, MD  carbamazepine (TEGRETOL) 200 MG tablet Take 1 tablet (200 mg total) by mouth 2 (two) times daily. 08/24/22  Yes Jon Billings, NP  diltiazem (CARDIZEM CD) 360 MG 24 hr capsule Take 1 capsule (360 mg total) by mouth daily. 12/19/22  Yes Larey Dresser, MD  empagliflozin (JARDIANCE) 10 MG TABS tablet Take 1 tablet (10 mg total) by mouth daily before breakfast. 05/26/22  Yes Jon Billings, NP  Fluticasone-Umeclidin-Vilant (TRELEGY ELLIPTA) 100-62.5-25 MCG/ACT AEPB Inhale 1 puff into the lungs daily. 11/24/22  Yes Jon Billings, NP  furosemide (LASIX) 40 MG tablet Take 1 tablet (40 mg total) by mouth every morning AND 0.5 tablets (20 mg total) every evening. 12/19/22  Yes Larey Dresser, MD  Iron, Ferrous Sulfate, 325 (65 Fe) MG TABS Take 325 mg by mouth daily. 08/24/22  Yes Jon Billings, NP  Magnesium Oxide -Mg Supplement (MAG-OXIDE) 200 MG  TABS Take 1 tablet (200 mg total) by mouth 2 (two) times daily. 12/26/22  Yes Jon Billings, NP  metoprolol tartrate (LOPRESSOR) 25 MG tablet Take 3 tablets (75 mg total) by mouth 2 (two) times daily. 12/19/22  Yes Larey Dresser, MD  potassium chloride (MICRO-K) 10 MEQ CR capsule Take 2 capsules (20 mEq total) by mouth daily. 12/19/22  Yes Larey Dresser, MD  rosuvastatin (CRESTOR) 20 MG tablet Take 1 tablet (20 mg total) by mouth daily. 12/19/22  Yes Larey Dresser, MD  sacubitril-valsartan (ENTRESTO) 49-51 MG Take 1 tablet by mouth 2 (two) times daily. 11/24/22  Yes Jon Billings, NP  spironolactone (ALDACTONE) 25 MG tablet Take 1 tablet (25 mg total) by mouth daily. 11/21/22  Yes Bensimhon, Shaune Pascal, MD  warfarin (COUMADIN) 5 MG tablet Take 1 tablet (5 mg total) by mouth as directed. 12/19/22  Yes Larey Dresser, MD    Review of Systems  Constitutional:  Negative for appetite change and fatigue.  HENT:  Positive for hearing loss. Negative for congestion, rhinorrhea and sore throat.   Eyes: Negative.   Respiratory:  Positive for shortness of breath and wheezing. Negative for cough and chest tightness.   Cardiovascular:  Positive for palpitations and leg swelling (improving). Negative for chest pain.  Gastrointestinal:  Negative for abdominal distention and abdominal pain.  Endocrine: Negative.   Genitourinary:  Negative for decreased urine volume.  Musculoskeletal:  Positive for arthralgias (right knee). Negative for back pain.  Skin: Negative.   Allergic/Immunologic: Negative.   Neurological:  Negative for dizziness, tremors and light-headedness.  Hematological:  Negative for adenopathy. Does not bruise/bleed easily.  Psychiatric/Behavioral:  Positive for sleep disturbance (sleeping on 2 pillows). Negative for dysphoric mood. The patient is not nervous/anxious.    Vitals:   12/29/22 1552  BP: 95/74  Pulse: 79  Resp: 18  SpO2: 98%  Weight: 197 lb (89.4 kg)   Wt Readings  from Last 3 Encounters:  12/29/22 197 lb (89.4 kg)  12/26/22 197 lb 8 oz (89.6 kg)  12/19/22 202 lb 6.4 oz (91.8 kg)   Lab Results  Component Value Date   CREATININE 1.49 (H) 12/29/2022   CREATININE 0.78 12/19/2022   CREATININE 0.84 11/29/2022   Physical Exam Vitals and nursing note reviewed.  Constitutional:      Appearance: Normal appearance.  HENT:     Head: Normocephalic and atraumatic.     Right Ear: Decreased hearing noted.     Left Ear: Decreased hearing noted.  Neck:     Vascular: No JVD.  Cardiovascular:     Rate and Rhythm: Normal rate. Rhythm irregular.     Heart sounds: Normal heart sounds.  Pulmonary:     Effort: Pulmonary effort is normal.     Breath sounds: No wheezing, rhonchi or rales.  Abdominal:     General:  There is no distension.     Palpations: Abdomen is soft.     Tenderness: There is no abdominal tenderness.  Musculoskeletal:        General: No tenderness.     Cervical back: Normal range of motion and neck supple.     Right lower leg: No tenderness. No edema.     Left lower leg: No tenderness. No edema.  Skin:    General: Skin is warm and dry.  Neurological:     General: No focal deficit present.     Mental Status: He is alert and oriented to person, place, and time.     Motor: No tremor.  Psychiatric:        Mood and Affect: Mood normal.        Behavior: Behavior normal.     Assessment & Plan:  1. Chronic heart failure with preserved ejection fraction- - NYHA class II - euvolemic today - weighing daily; reminded to call for an overnight weight gain of > 2 pounds or a weekly weight gain of > 5 pounds - weight down 5 pounds from last visit here 10 days ago - reviewed not adding salt to his food - jardiance 34m daily - entresto 49/572mBID - metoprolol tartrate 7587mID - spironolactone 12m57mily - furosemide 40mg85m 20mg 32m potassium 20meq 50my - BMP/ BNP today - saw [facility] provider (McLeanAundra Dubin24 - BNP 12/19/22 was 62.3  2:  HTN- - BP 95/74; discussed stopping PM dose of furosemide but will get lab results back first - saw PCP (HoldswMathis Dad24 - BMP from 12/19/22 reviewed and showed sodium 142, potassium 4.2, creatinine 0.78 and GFR >60  3: Atrial fibrillation- - TEE/ cardioversion scheduled for 01/12/23 - warfarin 5mg dai53m denies missing any doses - INR 12/28/22 was 3.1 - diltiazem CD 360mg dai29m metoprolol tartrate 75mg BID 73mCOPD-  - still smoking 2 cigars a day - complete cessation discussed with him - saw pulmonology (Gonzalez)Patsey Berthold 5: Chronic alcohol use- - drinks 1/2 pint of vodka daily, down from 1 pint - vague about beer usage as he says that he "pours a lot of it out" - reviewed the importance of continuing to decrease usage so that his chance of getting/ staying in a regular rhythm are better  - cessation discussed with patient for 3 mins   Medication bottles reviewed.   Return here pending after TEE cardioversion

## 2022-12-29 NOTE — Patient Instructions (Signed)
Go to the [facility] and get your lab work drawn today.

## 2022-12-29 NOTE — H&P (View-Only) (Signed)
Patient ID: Jose Dakins., male    DOB: [birth year], 52 y.o.   MRN: [id number]  Jose [name] is a 52 y/o male with a history of hyperlipidemia, HTN, current tobacco/ alcohol use and chronic heart failure.   Echo 11/11/22 showed an EF of 50-55% along with mild LVH, mildly elevated PA pressure, moderate LAE/RAE and trivial TR. Echo report from 11/05/21 reviewed and showed an EF of 60-65% along with moderate LVH. Echo report from 01/29/2019 reviewed and showed an EF of 60-65%  Admitted 11/10/22 due to seizures. Found to be in AF. Was in the ED 06/27/22 due to COPD exacerbation and alcohol intoxication where he was evaluated and released.   He presents today for a HF follow-up visit with a chief complaint of minimal SOB with moderate exertion. Describes this as chronic in nature. Has associated wheezing, pedal edema (improving), palpitations, right knee pain and difficulty sleeping along with this. Denies any abdominal distention, chest pain, cough, fatigue, dizziness or weight gain.   Continues to drink although says that he's trying to reduce the amount. Voices frustration with having to urinate so much at bedtime. Says that he takes his morning diuretic ~ 7am and then takes the 2nd dose ~ 6-7pm because he and his mom thought it had to be separated by 12 hours. Advised that he could take the PM dose early afternoon (~ 2pm)  Past Medical History:  Diagnosis Date   Alcohol abuse    Cervicalgia    CHF (congestive heart failure) (HCC)    COPD (chronic obstructive pulmonary disease) (HCC)    Hyperlipidemia    Hypertension    Seizures ([place])    Past Surgical History:  Procedure Laterality Date   CHOLECYSTECTOMY     COLONOSCOPY WITH PROPOFOL N/A 01/16/2018   Procedure: COLONOSCOPY WITH PROPOFOL;  Surgeon: Lin Landsman, MD;  Location: [facility] ENDOSCOPY;  Service: Gastroenterology;  Laterality: N/A;   ESOPHAGOGASTRODUODENOSCOPY (EGD) WITH PROPOFOL N/A 01/16/2018   Procedure: ESOPHAGOGASTRODUODENOSCOPY (EGD)  WITH PROPOFOL;  Surgeon: Lin Landsman, MD;  Location: [facility] ENDOSCOPY;  Service: Gastroenterology;  Laterality: N/A;   TONSILLECTOMY     Family History  Problem Relation Age of Onset   Hypertension Mother    Diabetes Mother    Colon cancer Mother    Hypertension Father    Multiple sclerosis Father    Social History   Tobacco Use   Smoking status: Every Day    Types: Cigars    Start date: 2020   Smokeless tobacco: Former   Tobacco comments:    2 cigars a day-05/05/2022    Quit smoking cigarettes 3 years ago.  Smokes cigarettes since he was 52 years old.  Substance Use Topics   Alcohol use: Yes    Alcohol/week: 28.0 standard drinks of alcohol    Types: 7 Cans of beer, 21 Shots of liquor per week    Comment: pint of liquor per day   No Known Allergies  Prior to Admission medications   Medication Sig Start Date End Date Taking? Authorizing Provider  albuterol (VENTOLIN HFA) 108 (90 Base) MCG/ACT inhaler Inhale 2 puffs into the lungs every 6 (six) hours as needed for wheezing or shortness of breath. 06/27/22  Yes Lucillie Garfinkel, MD  carbamazepine (TEGRETOL) 200 MG tablet Take 1 tablet (200 mg total) by mouth 2 (two) times daily. 08/24/22  Yes Jon Billings, NP  diltiazem (CARDIZEM CD) 360 MG 24 hr capsule Take 1 capsule (360 mg total) by mouth daily. 12/19/22  Yes Larey Dresser, MD  empagliflozin (JARDIANCE) 10 MG TABS tablet Take 1 tablet (10 mg total) by mouth daily before breakfast. 05/26/22  Yes Jon Billings, NP  Fluticasone-Umeclidin-Vilant (TRELEGY ELLIPTA) 100-62.5-25 MCG/ACT AEPB Inhale 1 puff into the lungs daily. 11/24/22  Yes Jon Billings, NP  furosemide (LASIX) 40 MG tablet Take 1 tablet (40 mg total) by mouth every morning AND 0.5 tablets (20 mg total) every evening. 12/19/22  Yes Larey Dresser, MD  Iron, Ferrous Sulfate, 325 (65 Fe) MG TABS Take 325 mg by mouth daily. 08/24/22  Yes Jon Billings, NP  Magnesium Oxide -Mg Supplement (MAG-OXIDE) 200 MG  TABS Take 1 tablet (200 mg total) by mouth 2 (two) times daily. 12/26/22  Yes Jon Billings, NP  metoprolol tartrate (LOPRESSOR) 25 MG tablet Take 3 tablets (75 mg total) by mouth 2 (two) times daily. 12/19/22  Yes Larey Dresser, MD  potassium chloride (MICRO-K) 10 MEQ CR capsule Take 2 capsules (20 mEq total) by mouth daily. 12/19/22  Yes Larey Dresser, MD  rosuvastatin (CRESTOR) 20 MG tablet Take 1 tablet (20 mg total) by mouth daily. 12/19/22  Yes Larey Dresser, MD  sacubitril-valsartan (ENTRESTO) 49-51 MG Take 1 tablet by mouth 2 (two) times daily. 11/24/22  Yes Jon Billings, NP  spironolactone (ALDACTONE) 25 MG tablet Take 1 tablet (25 mg total) by mouth daily. 11/21/22  Yes Bensimhon, Shaune Pascal, MD  warfarin (COUMADIN) 5 MG tablet Take 1 tablet (5 mg total) by mouth as directed. 12/19/22  Yes Larey Dresser, MD    Review of Systems  Constitutional:  Negative for appetite change and fatigue.  HENT:  Positive for hearing loss. Negative for congestion, rhinorrhea and sore throat.   Eyes: Negative.   Respiratory:  Positive for shortness of breath and wheezing. Negative for cough and chest tightness.   Cardiovascular:  Positive for palpitations and leg swelling (improving). Negative for chest pain.  Gastrointestinal:  Negative for abdominal distention and abdominal pain.  Endocrine: Negative.   Genitourinary:  Negative for decreased urine volume.  Musculoskeletal:  Positive for arthralgias (right knee). Negative for back pain.  Skin: Negative.   Allergic/Immunologic: Negative.   Neurological:  Negative for dizziness, tremors and light-headedness.  Hematological:  Negative for adenopathy. Does not bruise/bleed easily.  Psychiatric/Behavioral:  Positive for sleep disturbance (sleeping on 2 pillows). Negative for dysphoric mood. The patient is not nervous/anxious.    Vitals:   12/29/22 1552  BP: 95/74  Pulse: 79  Resp: 18  SpO2: 98%  Weight: 197 lb (89.4 kg)   Wt Readings  from Last 3 Encounters:  12/29/22 197 lb (89.4 kg)  12/26/22 197 lb 8 oz (89.6 kg)  12/19/22 202 lb 6.4 oz (91.8 kg)   Lab Results  Component Value Date   CREATININE 1.49 (H) 12/29/2022   CREATININE 0.78 12/19/2022   CREATININE 0.84 11/29/2022   Physical Exam Vitals and nursing note reviewed.  Constitutional:      Appearance: Normal appearance.  HENT:     Head: Normocephalic and atraumatic.     Right Ear: Decreased hearing noted.     Left Ear: Decreased hearing noted.  Neck:     Vascular: No JVD.  Cardiovascular:     Rate and Rhythm: Normal rate. Rhythm irregular.     Heart sounds: Normal heart sounds.  Pulmonary:     Effort: Pulmonary effort is normal.     Breath sounds: No wheezing, rhonchi or rales.  Abdominal:     General:  There is no distension.     Palpations: Abdomen is soft.     Tenderness: There is no abdominal tenderness.  Musculoskeletal:        General: No tenderness.     Cervical back: Normal range of motion and neck supple.     Right lower leg: No tenderness. No edema.     Left lower leg: No tenderness. No edema.  Skin:    General: Skin is warm and dry.  Neurological:     General: No focal deficit present.     Mental Status: He is alert and oriented to person, place, and time.     Motor: No tremor.  Psychiatric:        Mood and Affect: Mood normal.        Behavior: Behavior normal.     Assessment & Plan:  1. Chronic heart failure with preserved ejection fraction- - NYHA class II - euvolemic today - weighing daily; reminded to call for an overnight weight gain of > 2 pounds or a weekly weight gain of > 5 pounds - weight down 5 pounds from last visit here 10 days ago - reviewed not adding salt to his food - jardiance '10mg'$  daily - entresto 49/'51mg'$  BID - metoprolol tartrate '75mg'$  BID - spironolactone '25mg'$  daily - furosemide '40mg'$  AM/ '20mg'$  PM - potassium 105mq daily - BMP/ BNP today - saw [facility] provider (Aundra Dubin 12/19/22 - BNP 12/19/22 was 62.3  2:  HTN- - BP 95/74; discussed stopping PM dose of furosemide but will get lab results back first - saw PCP (Mathis Dad 12/26/22 - BMP from 12/19/22 reviewed and showed sodium 142, potassium 4.2, creatinine 0.78 and GFR >60  3: Atrial fibrillation- - TEE/ cardioversion scheduled for 01/12/23 - warfarin '5mg'$  daily; denies missing any doses - INR 12/28/22 was 3.1 - diltiazem CD '360mg'$  daily - metoprolol tartrate '75mg'$  BID  4: COPD-  - still smoking 2 cigars a day - complete cessation discussed with him - saw pulmonology (Patsey Berthold 05/05/22  5: Chronic alcohol use- - drinks 1/2 pint of vodka daily, down from 1 pint - vague about beer usage as he says that he "pours a lot of it out" - reviewed the importance of continuing to decrease usage so that his chance of getting/ staying in a regular rhythm are better  - cessation discussed with patient for 3 mins   Medication bottles reviewed.   Return here pending after TEE cardioversion

## 2022-12-29 NOTE — Progress Notes (Signed)
A full discussion of the nature of anticoagulants has been carried out.  A benefit risk analysis has been presented to the patient, so that they understand the justification for choosing anticoagulation at this time. The need for frequent and regular monitoring, precise dosage adjustment and compliance is stressed.  Side effects of potential bleeding are discussed.  The patient should avoid any OTC items containing aspirin or ibuprofen, and should avoid great swings in general diet.  Avoid alcohol consumption.  Call if any signs of abnormal bleeding.  Next PT/INR test in 1 week; telephone follow up thereafter.

## 2022-12-29 NOTE — Telephone Encounter (Signed)
TEE/DCCV sch for 3/7 at 7:30 AM, pt's mother is aware  INR 2/21=3.1, repeat sch for 2/28 and 3/6

## 2022-12-29 NOTE — Addendum Note (Signed)
Addended by: Jon Billings on: 12/29/2022 08:16 AM   Modules accepted: Level of Service

## 2022-12-30 ENCOUNTER — Telehealth: Payer: Self-pay

## 2022-12-30 ENCOUNTER — Telehealth: Payer: Self-pay | Admitting: Cardiology

## 2022-12-30 DIAGNOSIS — I4819 Other persistent atrial fibrillation: Secondary | ICD-10-CM

## 2022-12-30 MED ORDER — FUROSEMIDE 40 MG PO TABS: 40.0000 mg | ORAL_TABLET | Freq: Every morning | ORAL | 3 refills | Status: DC

## 2022-12-30 MED ORDER — POTASSIUM CHLORIDE ER 10 MEQ PO CPCR: 10.0000 meq | ORAL_CAPSULE | Freq: Every day | ORAL | 3 refills | Status: DC

## 2022-12-30 NOTE — Telephone Encounter (Signed)
LVM with patient stating we needed to move his appointment two weeks out to later in march with Sabharwal to give him time after cardioversion. I went ahead and cancelled his appointment for Monday 2/26   Enolia Koepke, [place]

## 2022-12-30 NOTE — Telephone Encounter (Addendum)
  Spoke w pt's mother Jose [name]), w pt's permission. Pt's mother aware, agreeable, and verbalized understanding. Stated she will fix medications for son.   ----- Message from Alisa Graff, FNP sent at 12/30/2022 10:03 AM EST ----- Please call patient's mom with this message.  You can stop the evening dose of furosemide (lasix) and continue taking the morning dose. Decrease the potassium to 1 capsule daily.

## 2023-01-02 ENCOUNTER — Encounter: Payer: BC Managed Care – PPO | Admitting: Cardiology

## 2023-01-02 ENCOUNTER — Telehealth: Payer: Self-pay

## 2023-01-02 NOTE — Telephone Encounter (Signed)
Received call form pt's daughter stating pt missed Warfarin dose last night (pt takes Warfarin at 6pm every evening) Advised to take 1.5 tablets tonight and then resume normal dose tomorrow. Pt has scheduled appt Wednesday in [place] to have INR checked.

## 2023-01-04 ENCOUNTER — Ambulatory Visit: Payer: BC Managed Care – PPO | Attending: Internal Medicine

## 2023-01-04 DIAGNOSIS — I4819 Other persistent atrial fibrillation: Secondary | ICD-10-CM | POA: Diagnosis not present

## 2023-01-04 DIAGNOSIS — Z7901 Long term (current) use of anticoagulants: Secondary | ICD-10-CM | POA: Diagnosis not present

## 2023-01-04 LAB — POCT INR: INR: 3.1 — AB (ref 2.0–3.0)

## 2023-01-04 NOTE — Patient Instructions (Signed)
Description   Continue taking 1 tablet ('5mg'$ ) once a day in the evening. Pending cardioversion on 01/12/23. Recheck in 1 week. Keep your intake of greens consistent Please call the [facility] with any questions at [phone]

## 2023-01-10 DIAGNOSIS — J449 Chronic obstructive pulmonary disease, unspecified: Secondary | ICD-10-CM | POA: Diagnosis not present

## 2023-01-10 DIAGNOSIS — G40909 Epilepsy, unspecified, not intractable, without status epilepticus: Secondary | ICD-10-CM | POA: Diagnosis not present

## 2023-01-10 DIAGNOSIS — I5031 Acute diastolic (congestive) heart failure: Secondary | ICD-10-CM | POA: Diagnosis not present

## 2023-01-10 DIAGNOSIS — F1020 Alcohol dependence, uncomplicated: Secondary | ICD-10-CM | POA: Diagnosis not present

## 2023-01-11 ENCOUNTER — Ambulatory Visit: Payer: BC Managed Care – PPO | Attending: Internal Medicine

## 2023-01-11 DIAGNOSIS — I4819 Other persistent atrial fibrillation: Secondary | ICD-10-CM

## 2023-01-11 DIAGNOSIS — Z7901 Long term (current) use of anticoagulants: Secondary | ICD-10-CM

## 2023-01-11 LAB — POCT INR: INR: 3 (ref 2.0–3.0)

## 2023-01-11 NOTE — Patient Instructions (Signed)
Description   Continue taking 1 tablet ('5mg'$ ) once a day in the evening.  Recheck in 1 week after cardioversion on 01/12/23. Keep your intake of greens consistent Please call the [facility]madin Clinic with any questions at [phone]

## 2023-01-12 ENCOUNTER — Ambulatory Visit
Admission: RE | Admit: 2023-01-12 | Discharge: 2023-01-12 | Disposition: A | Discharge status: Home / Self Care | Payer: BC Managed Care – PPO | Attending: Cardiology | Admitting: Cardiology

## 2023-01-12 ENCOUNTER — Encounter: Payer: Self-pay | Admitting: Cardiology

## 2023-01-12 ENCOUNTER — Ambulatory Visit: Payer: BC Managed Care – PPO | Admitting: Anesthesiology

## 2023-01-12 ENCOUNTER — Other Ambulatory Visit: Payer: Self-pay

## 2023-01-12 ENCOUNTER — Encounter
Admission: RE | Disposition: A | Discharge status: Home / Self Care | Payer: Self-pay | Source: Home / Self Care | Attending: Cardiology

## 2023-01-12 ENCOUNTER — Ambulatory Visit (HOSPITAL_BASED_OUTPATIENT_CLINIC_OR_DEPARTMENT_OTHER)
Admission: RE | Admit: 2023-01-12 | Discharge: 2023-01-12 | Disposition: A | Discharge status: Home / Self Care | Payer: BC Managed Care – PPO | Source: Home / Self Care | Attending: Cardiology | Admitting: Cardiology

## 2023-01-12 DIAGNOSIS — Z7901 Long term (current) use of anticoagulants: Secondary | ICD-10-CM | POA: Diagnosis not present

## 2023-01-12 DIAGNOSIS — F1090 Alcohol use, unspecified, uncomplicated: Secondary | ICD-10-CM | POA: Insufficient documentation

## 2023-01-12 DIAGNOSIS — I4819 Other persistent atrial fibrillation: Secondary | ICD-10-CM | POA: Diagnosis not present

## 2023-01-12 DIAGNOSIS — I4891 Unspecified atrial fibrillation: Secondary | ICD-10-CM | POA: Diagnosis not present

## 2023-01-12 DIAGNOSIS — Z79899 Other long term (current) drug therapy: Secondary | ICD-10-CM | POA: Insufficient documentation

## 2023-01-12 DIAGNOSIS — I480 Paroxysmal atrial fibrillation: Secondary | ICD-10-CM

## 2023-01-12 DIAGNOSIS — J449 Chronic obstructive pulmonary disease, unspecified: Secondary | ICD-10-CM | POA: Diagnosis not present

## 2023-01-12 DIAGNOSIS — I110 Hypertensive heart disease with heart failure: Secondary | ICD-10-CM | POA: Diagnosis not present

## 2023-01-12 DIAGNOSIS — E785 Hyperlipidemia, unspecified: Secondary | ICD-10-CM | POA: Diagnosis not present

## 2023-01-12 DIAGNOSIS — I5032 Chronic diastolic (congestive) heart failure: Secondary | ICD-10-CM | POA: Insufficient documentation

## 2023-01-12 DIAGNOSIS — F17290 Nicotine dependence, other tobacco product, uncomplicated: Secondary | ICD-10-CM | POA: Insufficient documentation

## 2023-01-12 DIAGNOSIS — F17200 Nicotine dependence, unspecified, uncomplicated: Secondary | ICD-10-CM | POA: Diagnosis not present

## 2023-01-12 DIAGNOSIS — I509 Heart failure, unspecified: Secondary | ICD-10-CM | POA: Diagnosis not present

## 2023-01-12 HISTORY — PX: TEE WITHOUT CARDIOVERSION: SHX5443

## 2023-01-12 HISTORY — PX: CARDIOVERSION: SHX1299

## 2023-01-12 LAB — PROTIME-INR
INR: 2.8 — ABNORMAL HIGH (ref 0.8–1.2)
Prothrombin Time: 29.5 seconds — ABNORMAL HIGH (ref 11.4–15.2)

## 2023-01-12 LAB — ECHO TEE: Est EF: 55

## 2023-01-12 SURGERY — ECHOCARDIOGRAM, TRANSESOPHAGEAL
Anesthesia: General

## 2023-01-12 MED ORDER — PROPOFOL 1000 MG/100ML IV EMUL
INTRAVENOUS | Status: AC
  Filled 2023-01-12: qty 100

## 2023-01-12 MED ORDER — PROPOFOL 10 MG/ML IV BOLUS
INTRAVENOUS | Status: DC | PRN
  Administered 2023-01-12: 30 mg via INTRAVENOUS
  Administered 2023-01-12 (×2): 50 mg via INTRAVENOUS
  Administered 2023-01-12: 20 mg via INTRAVENOUS
  Administered 2023-01-12: 30 mg via INTRAVENOUS
  Administered 2023-01-12: 20 mg via INTRAVENOUS

## 2023-01-12 MED ORDER — PROMETHAZINE HCL 25 MG/ML IJ SOLN: 6.2500 mg | INTRAMUSCULAR | Status: DC | PRN

## 2023-01-12 MED ORDER — DROPERIDOL 2.5 MG/ML IJ SOLN: 0.6250 mg | Freq: Once | INTRAMUSCULAR | Status: DC | PRN

## 2023-01-12 MED ORDER — SODIUM CHLORIDE 0.9 % IV SOLN: INTRAVENOUS | Status: DC

## 2023-01-12 MED ORDER — OXYCODONE HCL 5 MG/5ML PO SOLN: 5.0000 mg | Freq: Once | ORAL | Status: DC | PRN

## 2023-01-12 MED ORDER — BUTAMBEN-TETRACAINE-BENZOCAINE 2-2-14 % EX AERO
INHALATION_SPRAY | CUTANEOUS | Status: AC
  Filled 2023-01-12: qty 5

## 2023-01-12 MED ORDER — FENTANYL CITRATE (PF) 100 MCG/2ML IJ SOLN: 25.0000 ug | INTRAMUSCULAR | Status: DC | PRN

## 2023-01-12 MED ORDER — LIDOCAINE VISCOUS HCL 2 % MT SOLN
OROMUCOSAL | Status: AC
  Filled 2023-01-12: qty 15

## 2023-01-12 MED ORDER — MIDAZOLAM HCL 2 MG/2ML IJ SOLN
INTRAMUSCULAR | Status: AC
  Filled 2023-01-12: qty 2

## 2023-01-12 MED ORDER — ACETAMINOPHEN 10 MG/ML IV SOLN: 1000.0000 mg | Freq: Once | INTRAVENOUS | Status: DC | PRN

## 2023-01-12 MED ORDER — OXYCODONE HCL 5 MG PO TABS: 5.0000 mg | ORAL_TABLET | Freq: Once | ORAL | Status: DC | PRN

## 2023-01-12 MED ORDER — MIDAZOLAM HCL 2 MG/2ML IJ SOLN
INTRAMUSCULAR | Status: DC | PRN
  Administered 2023-01-12: 2 mg via INTRAVENOUS

## 2023-01-12 NOTE — Procedures (Signed)
Electrical Cardioversion Procedure Note Jose [name] [id number] 02-28-1971  Procedure: Electrical Cardioversion Indications:  Atrial Fibrillation  Procedure Details Consent: Risks of procedure as well as the alternatives and risks of each were explained to the (patient/caregiver).  Consent for procedure obtained. Time Out: Verified patient identification, verified procedure, site/side was marked, verified correct patient position, special equipment/implants available, medications/allergies/relevent history reviewed, required imaging and test results available.  Performed  Patient placed on cardiac monitor, pulse oximetry, supplemental oxygen as necessary.  Sedation given:  Per anesthesiology Pacer pads placed anterior and posterior chest.  Cardioverted 1 time(s).  Cardioverted at [place].  Evaluation Findings: Post procedure EKG shows: NSR Complications: None Patient did tolerate procedure well.   Jose [name] 01/12/2023, 7:54 AM

## 2023-01-12 NOTE — Transfer of Care (Signed)
Immediate Anesthesia Transfer of Care Note  Patient: Jose [name].  Procedure(s) Performed: TRANSESOPHAGEAL ECHOCARDIOGRAM (TEE) CARDIOVERSION  Patient Location: PACU and special recoveries  Anesthesia Type:General  Level of Consciousness: drowsy and patient cooperative  Airway & Oxygen Therapy: Patient Spontanous Breathing and Patient connected to nasal cannula oxygen  Post-op Assessment: Report given to RN and Post -op Vital signs reviewed and stable  Post vital signs: Reviewed and stable  Last Vitals:  Vitals Value Taken Time  BP 108/72 01/12/23 0750  Temp    Pulse 76 01/12/23 0751  Resp 27 01/12/23 0751  SpO2 93 % 01/12/23 0751    Last Pain:  Vitals:   01/12/23 0711  TempSrc: Oral  PainSc: 0-No pain         Complications: No notable events documented.

## 2023-01-12 NOTE — Anesthesia Procedure Notes (Signed)
Procedure Name: MAC Date/Time: 01/12/2023 7:33 AM  Performed by: Jerrye Noble, CRNAPre-anesthesia Checklist: Patient identified, Emergency Drugs available, Suction available and Patient being monitored Patient Re-evaluated:Patient Re-evaluated prior to induction Oxygen Delivery Method: Nasal cannula

## 2023-01-12 NOTE — Anesthesia Postprocedure Evaluation (Signed)
Anesthesia Post Note  Patient: Jose [name].  Procedure(s) Performed: TRANSESOPHAGEAL ECHOCARDIOGRAM (TEE) CARDIOVERSION  Patient location during evaluation: PACU Anesthesia Type: General Level of consciousness: awake and alert Pain management: pain level controlled Vital Signs Assessment: post-procedure vital signs reviewed and stable Respiratory status: spontaneous breathing, nonlabored ventilation, respiratory function stable and patient connected to nasal cannula oxygen Cardiovascular status: blood pressure returned to baseline and stable Postop Assessment: no apparent nausea or vomiting Anesthetic complications: no   No notable events documented.   Last Vitals:  Vitals:   01/12/23 0750 01/12/23 0751  BP: 108/72   Pulse: 78 76  Resp: (!) 26 (!) 27  Temp:    SpO2: 93% 93%    Last Pain:  Vitals:   01/12/23 0711  TempSrc: Oral  PainSc: 0-No pain                 Molli Barrows

## 2023-01-12 NOTE — CV Procedure (Signed)
Procedure: TEE  Indication: Atrial fibrillation  Sedation: Per anesthesiology  Findings: Please see echo section for full report.  Normal LV size with mild LV hypertrophy, EF 55% with normal wall motion.  Mildly dilated RV with mildly decreased systolic function.  Mild left atrial enlargement, no LA appendage thrombus.  Mild right atrial enlargement.  No PFO/ASD by color doppler.  Trivial TR, peak RV-RA gradient 30 mmHg.  Mild MR.  Trileaflet aortic valve with no stenosis or regurgitation.   Normal caliber thoracic aorta with no plaque.   May proceed to DCCV.   Jose [name] 01/12/2023 7:53 AM

## 2023-01-12 NOTE — Interval H&P Note (Signed)
History and Physical Interval Note:  01/12/2023 7:31 AM  Jose [name].  has presented today for surgery, with the diagnosis of TEE and Cardioversion    Afib.  The various methods of treatment have been discussed with the patient and family. After consideration of risks, benefits and other options for treatment, the patient has consented to  Procedure(s): TRANSESOPHAGEAL ECHOCARDIOGRAM (TEE) (N/A) CARDIOVERSION (N/A) as a surgical intervention.  The patient's history has been reviewed, patient examined, no change in status, stable for surgery.  I have reviewed the patient's chart and labs.  Questions were answered to the patient's satisfaction.     Kailer Heindel [employer]

## 2023-01-12 NOTE — Anesthesia Preprocedure Evaluation (Signed)
Anesthesia Evaluation  Patient identified by MRN, date of birth, ID band Patient awake    Reviewed: Allergy & Precautions, H&P , NPO status , Patient's Chart, lab work & pertinent test results, reviewed documented beta blocker date and time   Airway Mallampati: II   Neck ROM: full    Dental  (+) Poor Dentition   Pulmonary COPD,  COPD inhaler, Current Smoker   Pulmonary exam normal        Cardiovascular Exercise Tolerance: Poor hypertension, On Medications +CHF  Normal cardiovascular exam Rhythm:regular Rate:Normal     Neuro/Psych Seizures -,   negative psych ROS   GI/Hepatic negative GI ROS, Neg liver ROS,,,  Endo/Other  negative endocrine ROS    Renal/GU negative Renal ROS  negative genitourinary   Musculoskeletal   Abdominal   Peds  Hematology  (+) Blood dyscrasia, anemia   Anesthesia Other Findings Past Medical History: No date: Alcohol abuse No date: Cervicalgia No date: CHF (congestive heart failure) (HCC) No date: COPD (chronic obstructive pulmonary disease) (HCC) No date: Hyperlipidemia No date: Hypertension No date: Seizures [facility]) Past Surgical History: No date: CHOLECYSTECTOMY 01/16/2018: COLONOSCOPY WITH PROPOFOL; N/A     Comment:  Procedure: COLONOSCOPY WITH PROPOFOL;  Surgeon: Lin Landsman, MD;  Location: [facility] ENDOSCOPY;  Service:               Gastroenterology;  Laterality: N/A; 01/16/2018: ESOPHAGOGASTRODUODENOSCOPY (EGD) WITH PROPOFOL; N/A     Comment:  Procedure: ESOPHAGOGASTRODUODENOSCOPY (EGD) WITH               PROPOFOL;  Surgeon: Lin Landsman, MD;  Location:               [facility] ENDOSCOPY;  Service: Gastroenterology;  Laterality:               N/A; No date: TONSILLECTOMY   Reproductive/Obstetrics negative OB ROS                             Anesthesia Physical Anesthesia Plan  ASA: 4  Anesthesia Plan: General   Post-op Pain  Management:    Induction:   PONV Risk Score and Plan: 1  Airway Management Planned:   Additional Equipment:   Intra-op Plan:   Post-operative Plan:   Informed Consent: I have reviewed the patients History and Physical, chart, labs and discussed the procedure including the risks, benefits and alternatives for the proposed anesthesia with the patient or authorized representative who has indicated his/her understanding and acceptance.     Dental Advisory Given  Plan Discussed with: CRNA  Anesthesia Plan Comments:        Anesthesia Quick Evaluation

## 2023-01-12 NOTE — Progress Notes (Signed)
*  PRELIMINARY RESULTS* Echocardiogram Echocardiogram Transesophageal has been performed.  Sherrie Sport 01/12/2023, 8:19 AM

## 2023-01-18 ENCOUNTER — Ambulatory Visit: Payer: BC Managed Care – PPO | Attending: Internal Medicine

## 2023-01-18 DIAGNOSIS — I4819 Other persistent atrial fibrillation: Secondary | ICD-10-CM | POA: Diagnosis not present

## 2023-01-18 DIAGNOSIS — Z7901 Long term (current) use of anticoagulants: Secondary | ICD-10-CM

## 2023-01-18 LAB — POCT INR: INR: 3.1 — AB (ref 2.0–3.0)

## 2023-01-18 NOTE — Patient Instructions (Signed)
Description   Continue taking 1 tablet ('5mg'$ ) once a day in the evening.  Recheck in 4 weeks.Marland Kitchen Keep your intake of greens consistent Please call the Coumadin Clinic with any questions at [phone]

## 2023-01-20 ENCOUNTER — Telehealth: Payer: Self-pay | Admitting: Nurse Practitioner

## 2023-01-20 NOTE — Telephone Encounter (Unsigned)
Copied from Lena [id number]. Topic: General - Other >> Jan 20, 2023  2:45 PM [place] B wrote: Reason for CRM: Patient called in checking on status of extended disability. I asked him about paperwork he didn't seem to understand that part. Please cb to discuss

## 2023-01-24 NOTE — Telephone Encounter (Signed)
Tried reaching out to patient. We currently do not have any paperwork to be filled out for this patient. LVM asking for patient to please have paperwork that he is referring to sent over to the office so we can look into it for him.

## 2023-01-25 ENCOUNTER — Telehealth: Payer: Self-pay | Admitting: Nurse Practitioner

## 2023-01-25 DIAGNOSIS — I5032 Chronic diastolic (congestive) heart failure: Secondary | ICD-10-CM | POA: Diagnosis not present

## 2023-01-25 DIAGNOSIS — D500 Iron deficiency anemia secondary to blood loss (chronic): Secondary | ICD-10-CM | POA: Diagnosis not present

## 2023-01-25 DIAGNOSIS — F10920 Alcohol use, unspecified with intoxication, uncomplicated: Secondary | ICD-10-CM | POA: Diagnosis not present

## 2023-01-25 DIAGNOSIS — I509 Heart failure, unspecified: Secondary | ICD-10-CM | POA: Diagnosis not present

## 2023-01-25 NOTE — Telephone Encounter (Signed)
Pt dropped off form for disability work status sheet to be filled out by provider.  Mother also came in and said that they needed the paperwork ASAP.  I advised it could take up to 5-7 business days but it probably wouldn't take that long.  He stated he would [place] tomorrow and I told him that someone should call him when they are ready for pickup. Placed in provider's folder.

## 2023-01-27 NOTE — Telephone Encounter (Unsigned)
Pt called to check on the status of disability paperwork. Expressed understanding that paperwork would be completed on Monday.

## 2023-01-27 NOTE — Telephone Encounter (Signed)
Disability paperwork started. Will complete on Monday upon provider returning as she is off this afternoon.

## 2023-01-30 NOTE — Telephone Encounter (Signed)
Called and spoke with patient. Completed paperwork and placed up front for the patient to pick up. Copy placed in the bin to be scanned into chart.

## 2023-02-15 ENCOUNTER — Ambulatory Visit: Payer: BC Managed Care – PPO | Attending: Internal Medicine

## 2023-02-15 DIAGNOSIS — I4819 Other persistent atrial fibrillation: Secondary | ICD-10-CM

## 2023-02-15 DIAGNOSIS — Z7901 Long term (current) use of anticoagulants: Secondary | ICD-10-CM | POA: Diagnosis not present

## 2023-02-15 LAB — POCT INR: INR: 3.2 — AB (ref 2.0–3.0)

## 2023-02-15 NOTE — Patient Instructions (Signed)
Description   Start taking 1 tablet (5mg ) daily except 1/2 tablet on Wednesdays.  Recheck in 4 weeks. Keep your intake of greens consistent Please call the Coumadin Clinic with any questions at [phone]

## 2023-02-22 ENCOUNTER — Ambulatory Visit (HOSPITAL_BASED_OUTPATIENT_CLINIC_OR_DEPARTMENT_OTHER): Payer: BC Managed Care – PPO | Admitting: Cardiology

## 2023-02-22 ENCOUNTER — Encounter: Payer: Self-pay | Admitting: Cardiology

## 2023-02-22 ENCOUNTER — Other Ambulatory Visit
Admission: RE | Admit: 2023-02-22 | Discharge: 2023-02-22 | Disposition: A | Discharge status: Home / Self Care | Payer: BC Managed Care – PPO | Source: Ambulatory Visit | Attending: Cardiology | Admitting: Cardiology

## 2023-02-22 VITALS — BP 110/77 | HR 74 | Wt 200.2 lb

## 2023-02-22 DIAGNOSIS — E785 Hyperlipidemia, unspecified: Secondary | ICD-10-CM | POA: Diagnosis not present

## 2023-02-22 DIAGNOSIS — I4819 Other persistent atrial fibrillation: Secondary | ICD-10-CM | POA: Insufficient documentation

## 2023-02-22 DIAGNOSIS — I110 Hypertensive heart disease with heart failure: Secondary | ICD-10-CM | POA: Insufficient documentation

## 2023-02-22 DIAGNOSIS — R0602 Shortness of breath: Secondary | ICD-10-CM | POA: Insufficient documentation

## 2023-02-22 DIAGNOSIS — Z7901 Long term (current) use of anticoagulants: Secondary | ICD-10-CM | POA: Insufficient documentation

## 2023-02-22 DIAGNOSIS — D6959 Other secondary thrombocytopenia: Secondary | ICD-10-CM | POA: Diagnosis not present

## 2023-02-22 DIAGNOSIS — R0600 Dyspnea, unspecified: Secondary | ICD-10-CM | POA: Insufficient documentation

## 2023-02-22 DIAGNOSIS — I5022 Chronic systolic (congestive) heart failure: Secondary | ICD-10-CM | POA: Diagnosis not present

## 2023-02-22 DIAGNOSIS — F17290 Nicotine dependence, other tobacco product, uncomplicated: Secondary | ICD-10-CM | POA: Diagnosis not present

## 2023-02-22 DIAGNOSIS — J449 Chronic obstructive pulmonary disease, unspecified: Secondary | ICD-10-CM | POA: Diagnosis not present

## 2023-02-22 DIAGNOSIS — I5032 Chronic diastolic (congestive) heart failure: Secondary | ICD-10-CM | POA: Diagnosis not present

## 2023-02-22 DIAGNOSIS — F1010 Alcohol abuse, uncomplicated: Secondary | ICD-10-CM

## 2023-02-22 LAB — CBC
HCT: 34.4 % — ABNORMAL LOW (ref 39.0–52.0)
Hemoglobin: 11.4 g/dL — ABNORMAL LOW (ref 13.0–17.0)
MCH: 30.7 pg (ref 26.0–34.0)
MCHC: 33.1 g/dL (ref 30.0–36.0)
MCV: 92.7 fL (ref 80.0–100.0)
Platelets: 117 10*3/uL — ABNORMAL LOW (ref 150–400)
RBC: 3.71 MIL/uL — ABNORMAL LOW (ref 4.22–5.81)
RDW: 16.9 % — ABNORMAL HIGH (ref 11.5–15.5)
WBC: 5.4 10*3/uL (ref 4.0–10.5)
nRBC: 0 % (ref 0.0–0.2)

## 2023-02-22 LAB — BASIC METABOLIC PANEL
Anion gap: 8 (ref 5–15)
BUN: 54 mg/dL — ABNORMAL HIGH (ref 6–20)
CO2: 24 mmol/L (ref 22–32)
Calcium: 9.4 mg/dL (ref 8.9–10.3)
Chloride: 103 mmol/L (ref 98–111)
Creatinine, Ser: 1.1 mg/dL (ref 0.61–1.24)
GFR, Estimated: >60 mL/min (ref >60)
Glucose, Bld: 117 mg/dL — ABNORMAL HIGH (ref 70–99)
Potassium: 5.1 mmol/L (ref 3.5–5.1)
Sodium: 135 mmol/L (ref 135–145)

## 2023-02-22 LAB — LIPID PANEL
Cholesterol: 204 mg/dL — ABNORMAL HIGH (ref 0–200)
HDL: 88 mg/dL (ref >40)
LDL Cholesterol: 99 mg/dL (ref 0–99)
Total CHOL/HDL Ratio: 2.3 RATIO
Triglycerides: 84 mg/dL (ref <150)
VLDL: 17 mg/dL (ref 0–40)

## 2023-02-22 LAB — TSH: TSH: 1.107 u[IU]/mL (ref 0.350–4.500)

## 2023-02-22 LAB — BRAIN NATRIURETIC PEPTIDE: B Natriuretic Peptide: 30.5 pg/mL (ref 0.0–100.0)

## 2023-02-22 NOTE — Progress Notes (Signed)
ADVANCED HEART FAILURE CLINIC NOTE  Referring Physician: [name], NP  Primary Care: [name], NP HF Cardiology: Dr. Gasper Lloyd  HPI: Jose [name]. is a 52 y.o. male with hypertension, hyperlipidemia, COPD, significant alcohol abuse, history of seizures, persistent atrial fibrillation.  Patient was admitted in 1/24 with fevers chills cough and fatigue.  He presented hypotensive to 60s/40s with new onset atrial fibrillation with RVR. TTE during admit with LVEF of 50-55%, moderate biatrial enlargement, normal RV. He was seen by neurology for possible alcohol withdrawal related seizures and continued on home tegretol (undetectable levels on admit). For his atrial fibrillation he was discharged home on cardizem & lopressor. Anticoagulation was not started due to concern for continued alcohol abuse and thrombocytopenia.   Patient was ultimately started on warfarin and had TEE-guided DCCV in 3/24.  TEE showed EF 55%, normal RV, mild LAE.    He returns today for followup of CHF and atrial fibrillation.  He remains in NSR today and denies palpitations.  He has cut back ETOH but still drinks 1/2 pint on weekend days and less during the week.  Still smoking 2 cigars/day.  His exertional dyspnea really has not improved much with DCCV.  Still short of breath taking trashcan to road, lifting heavy objects.  Short of breath walking a long distance.  No chest pain.  No orthopnea/PND.  He gets sleepy during the day.   ECG (personally reviewed): NSR, 1st degree AVB  Labs (1/24): K 3.7, creatinine 0.84, plts 345, hgb 12.3 Labs (2/24): K 4.6, creatinine 1.49, BNP 58  Past Medical History:  Diagnosis Date   Alcohol abuse    Cervicalgia    CHF (congestive heart failure)    COPD (chronic obstructive pulmonary disease)    Hyperlipidemia    Hypertension    Seizures     Current Outpatient Medications  Medication Sig Dispense Refill   albuterol (VENTOLIN HFA) 108 (90 Base) MCG/ACT inhaler  Inhale 2 puffs into the lungs every 6 (six) hours as needed for wheezing or shortness of breath. 8 g 2   carbamazepine (TEGRETOL) 200 MG tablet Take 1 tablet (200 mg total) by mouth 2 (two) times daily. 180 tablet 1   diltiazem (CARDIZEM CD) 360 MG 24 hr capsule Take 1 capsule (360 mg total) by mouth daily. 90 capsule 1   empagliflozin (JARDIANCE) 10 MG TABS tablet Take 1 tablet (10 mg total) by mouth daily before breakfast. 90 tablet 1   Fluticasone-Umeclidin-Vilant (TRELEGY ELLIPTA) 100-62.5-25 MCG/ACT AEPB Inhale 1 puff into the lungs daily. 14 each 0   furosemide (LASIX) 40 MG tablet Take 1 tablet (40 mg total) by mouth every morning. 30 tablet 3   Iron, Ferrous Sulfate, 325 (65 Fe) MG TABS Take 325 mg by mouth daily. 90 tablet 1   Magnesium Oxide -Mg Supplement (MAG-OXIDE) 200 MG TABS Take 1 tablet (200 mg total) by mouth 2 (two) times daily. 180 tablet 1   metoprolol tartrate (LOPRESSOR) 25 MG tablet Take 3 tablets (75 mg total) by mouth 2 (two) times daily. 180 tablet 3   potassium chloride (KLOR-CON) 10 MEQ tablet Take 20 mEq by mouth daily.     rosuvastatin (CRESTOR) 20 MG tablet Take 1 tablet (20 mg total) by mouth daily. 90 tablet 1   sacubitril-valsartan (ENTRESTO) 49-51 MG Take 1 tablet by mouth 2 (two) times daily. 180 tablet 1   spironolactone (ALDACTONE) 25 MG tablet Take 1 tablet (25 mg total) by mouth daily. 30 tablet 3  warfarin (COUMADIN) 5 MG tablet Take 1 tablet (5 mg total) by mouth as directed. 30 tablet 3   No current facility-administered medications for this visit.    No Known Allergies    Social History   Socioeconomic History   Marital status: Widowed    Spouse name: Not on file   Number of children: Not on file   Years of education: Not on file   Highest education level: Not on file  Occupational History   Not on file  Tobacco Use   Smoking status: Every Day    Types: Cigars    Start date: 2020   Smokeless tobacco: Former   Tobacco comments:    2  cigars a day-05/05/2022    Quit smoking cigarettes 3 years ago.  Smokes cigarettes since he was 52 years old.  Vaping Use   Vaping Use: Never used  Substance and Sexual Activity   Alcohol use: Yes    Alcohol/week: 28.0 standard drinks of alcohol    Types: 7 Cans of beer, 21 Shots of liquor per week    Comment: pint of liquor per day   Drug use: No    Types: Marijuana    Comment: quit back in the late 90's   Sexual activity: Yes  Other Topics Concern   Not on file  Social History Narrative   Lives with his Mother Jose [name]    Social Determinants of Health   Financial Resource Strain: Low Risk  (07/01/2019)   Overall Financial Resource Strain (CARDIA)    Difficulty of Paying Living Expenses: Not hard at all  Food Insecurity: No Food Insecurity (11/11/2022)   Hunger Vital Sign    Worried About Running Out of Food in the Last Year: Never true    Ran Out of Food in the Last Year: Never true  Transportation Needs: No Transportation Needs (11/11/2022)   PRAPARE - Administrator, Civil Service (Medical): No    Lack of Transportation (Non-Medical): No  Physical Activity: Sufficiently Active (07/01/2019)   Exercise Vital Sign    Days of Exercise per Week: 3 days    Minutes of Exercise per Session: 50 min  Stress: No Stress Concern Present (09/02/2019)   [employer] of Occupational Health - Occupational Stress Questionnaire    Feeling of Stress : Not at all  Social Connections: Moderately Isolated (07/01/2019)   Social Connection and Isolation Panel [NHANES]    Frequency of Communication with Friends and Family: Three times a week    Frequency of Social Gatherings with Friends and Family: Three times a week    Attends Religious Services: 1 to 4 times per year    Active Member of Clubs or Organizations: No    Attends Banker Meetings: Never    Marital Status: Widowed  Intimate Partner Violence: Not At Risk (11/11/2022)   Humiliation, Afraid, Rape, and Kick  questionnaire    Fear of Current or Ex-Partner: No    Emotionally Abused: No    Physically Abused: No    Sexually Abused: No      Family History  Problem Relation Age of Onset   Hypertension Mother    Diabetes Mother    Colon cancer Mother    Hypertension Father    Multiple sclerosis Father     PHYSICAL EXAM: Vitals:   02/22/23 1129  BP: 110/77  Pulse: 74  SpO2: 99%   General: NAD Neck: No JVD, no thyromegaly or thyroid nodule.  Lungs: Rhonchi bilaterally CV:  Nondisplaced PMI.  Heart regular S1/S2, no S3/S4, no murmur.  No peripheral edema.  No carotid bruit.  Normal pedal pulses.  Abdomen: Soft, nontender, no hepatosplenomegaly, no distention.  Skin: Intact without lesions or rashes.  Neurologic: Alert and oriented x 3.  Psych: Normal affect. Extremities: No clubbing or cyanosis.  HEENT: Normal.   ECHO: 11/11/22: LVEF 50-55%, normal RV function 11/05/21: LVEF 60-65%, normal RV function TEE (3/24): EF 55%, RV normal   ASSESSMENT & PLAN:  1.  Chronic diastolic CHF: Echo in 1/24 with EF 50-55%, normal RV in setting of atrial fibrillation with RVR.  TEE in 3/24 with EF 55% and normal RV.  He has NYHA class III symptoms still but is in NSR today and is not volume overloaded on exam. I wonder if his symptoms are more due to COPD than CHF.  - Continue Lasix 40 mg daily, would not increase.  BMET/BNP today.  - Continue Jardiance 10 mg daily.  - Continue Entresto 49/51 bid.  - Continue spironolactone 25 daily.  - Check PFTs to assess for significant COPD as possible cause of exertional dyspnea.  - I will arrange for Cardiolite to assess for ischemia as cause of his symptoms.  2. Atrial fibrillation: Persistent.  He was not started on anticoagulation during 1/24 admission due to ETOH abuse, thrombocytopenia, and concerns about fall risk and ability to take anticoagulation consistently.   He has cut back on ETOH and has not had any falls.  Unfortunately, he is on Tegretol so  cannot take Eliquis, Xarelto, or Pradaxa.  GFR is too high for edoxaban.  He is, therefore, on warfarin and has had no overt bleeding. He had TEE-guided DCCV in 3/24 and remains in NSR today.  - Continue warfarin.  - Continue diltiazem CD 360 mg daily and metoprolol 75 mg bid.  - Continue to cut back on ETOH.  - Suspect OSA, will arrange for sleep study.  - If he can cut back more on ETOH, would refer to EP for AF ablation.    3. Alcohol abuse: Long history of alcohol abuse. He has not quit drinking but is cutting back.  - Discussed imperative to continue cutting back ETOH.  4. Thrombocytopenia: ?Related to ETOH abuse.  Last CBC with plts down to 85K.  - Repeat CBC.   Followup in 6 wks.   Claborn Janusz 02/22/2023

## 2023-02-22 NOTE — Patient Instructions (Addendum)
Your provider requests you have a Myoview ( they will call you to schedule this appointment)  Your provider requests you have pulmonary functions tests.  Your provider requests you have a home sleep study.  Routine lab work today. Will notify you of abnormal results  Follow up in  6 weeks  Do the following things EVERYDAY: Weigh yourself in the morning before breakfast. Write it down and keep it in a log. Take your medicines as prescribed Eat low salt foods--Limit salt (sodium) to 2000 mg per day.  Stay as active as you can everyday Limit all fluids for the day to less than 2 liters

## 2023-02-25 DIAGNOSIS — I5032 Chronic diastolic (congestive) heart failure: Secondary | ICD-10-CM | POA: Diagnosis not present

## 2023-02-25 DIAGNOSIS — D500 Iron deficiency anemia secondary to blood loss (chronic): Secondary | ICD-10-CM | POA: Diagnosis not present

## 2023-02-25 DIAGNOSIS — F10920 Alcohol use, unspecified with intoxication, uncomplicated: Secondary | ICD-10-CM | POA: Diagnosis not present

## 2023-02-25 DIAGNOSIS — I509 Heart failure, unspecified: Secondary | ICD-10-CM | POA: Diagnosis not present

## 2023-02-28 ENCOUNTER — Encounter: Payer: Self-pay | Admitting: Nurse Practitioner

## 2023-02-28 ENCOUNTER — Ambulatory Visit: Payer: BC Managed Care – PPO | Admitting: Nurse Practitioner

## 2023-02-28 ENCOUNTER — Telehealth: Payer: Self-pay | Admitting: Nurse Practitioner

## 2023-02-28 VITALS — BP 100/59 | HR 84 | Temp 99.6°F | Wt 200.7 lb

## 2023-02-28 DIAGNOSIS — E7800 Pure hypercholesterolemia, unspecified: Secondary | ICD-10-CM

## 2023-02-28 DIAGNOSIS — R7303 Prediabetes: Secondary | ICD-10-CM

## 2023-02-28 DIAGNOSIS — G40909 Epilepsy, unspecified, not intractable, without status epilepticus: Secondary | ICD-10-CM

## 2023-02-28 DIAGNOSIS — J449 Chronic obstructive pulmonary disease, unspecified: Secondary | ICD-10-CM

## 2023-02-28 DIAGNOSIS — I5032 Chronic diastolic (congestive) heart failure: Secondary | ICD-10-CM | POA: Diagnosis not present

## 2023-02-28 DIAGNOSIS — I4819 Other persistent atrial fibrillation: Secondary | ICD-10-CM | POA: Diagnosis not present

## 2023-02-28 DIAGNOSIS — I10 Essential (primary) hypertension: Secondary | ICD-10-CM | POA: Diagnosis not present

## 2023-02-28 DIAGNOSIS — Z0289 Encounter for other administrative examinations: Secondary | ICD-10-CM

## 2023-02-28 DIAGNOSIS — D500 Iron deficiency anemia secondary to blood loss (chronic): Secondary | ICD-10-CM

## 2023-02-28 DIAGNOSIS — D696 Thrombocytopenia, unspecified: Secondary | ICD-10-CM

## 2023-02-28 DIAGNOSIS — F1010 Alcohol abuse, uncomplicated: Secondary | ICD-10-CM

## 2023-02-28 NOTE — Telephone Encounter (Signed)
Can we call over to Dr. Alford Highland office and find out when the patient is supposed to be scheduled for his PFTs and stress test.  Patient was in the office today and not sure when these tests are supposed to be.

## 2023-02-28 NOTE — Assessment & Plan Note (Signed)
Chronic. Ongoing problem.  Recently saw Cardiology on 02/22/23.  Echo from January 24 showed EF of 50-55%.    New onset Afib during hospitalization secondary to seizures from alcohol consumption.  He was recently started on Coumadin.  He is not able to take other anticoagulants.  He is supposed to have a stress test done but doesn't have it scheduled.   Continue with Entresto, Metoprolol and Diltalizem.  Reviewed labs from last week.  Follow up in 2 month.

## 2023-02-28 NOTE — Progress Notes (Unsigned)
BP (!) 100/59   Pulse 84   Temp 99.6 F (37.6 C) (Oral)   Wt 200 lb 11.2 oz (91 kg)   SpO2 96%   BMI 28.80 kg/m    Subjective:    Patient ID: Jose [name]., male    DOB: [birth year], 52 y.o.   MRN: [id number]  HPI: Gottlieb Zuercher. is a 52 y.o. male  Chief Complaint  Patient presents with   COPD   Diabetes   Hyperlipidemia   Hypertension   HYPERTENSION / HYPERLIPIDEMIA/HF Patient has seen Cardiology recently.  Started on Warfarin for anticoagulation.  He states he is supposed to get a Stress Test but isn't sure when that will be.  He continues to have ongoing exertional SOB.  He does have NYHA class III symptoms. He appears euvolemic at visit today.   Satisfied with current treatment? yes Duration of hypertension: years BP monitoring frequency: not checking BP range:  BP medication side effects: no Past BP meds:  Lasix, amlodipine, carvedilol, and lisinopril Duration of hyperlipidemia: years Cholesterol medication side effects: no Cholesterol supplements: none Past cholesterol medications: rosuvastatin (crestor) Medication compliance: excellent compliance Aspirin: no Recent stressors: no Recurrent headaches: no Visual changes: no Palpitations: no Dyspnea: yes Chest pain: no Lower extremity edema: no Dizzy/lightheaded: no  ALCOHOL USE Drinking about a half a pint a day.  He was previously drinking 1 pint daily.   COPD Ongoing exertional dyspnea.  Per Cardiology's note they believe it is more related to COPD.  They have ordered PFTs but patient hasn't been scheduled for this yet.  He does agree to see Pulmonology. COPD status: exacerbated Satisfied with current treatment?: no Oxygen use: no Dyspnea frequency: with exertion Cough frequency: no Rescue inhaler frequency:   Limitation of activity: yes Productive cough:  Last Spirometry:  Pneumovax: Up to Date Influenza: Up to Date Still smokes 2 cigars daily.    Patient has been out of work since his  hospitalization for his seizure back in January.  He needs paperwork completed to either keep him out of work or return to work.  Him and his mother who is present at visit today feel he should remain out of work until it is determined why he is having SOB.  I spoke with Dr. Sherryll Burger on the phone who states he is okay with patient staying out of work.  However, if he wanted to go back to work he would clear him to do so also.   Relevant past medical, surgical, family and social history reviewed and updated as indicated. Interim medical history since our last visit reviewed. Allergies and medications reviewed and updated.  Review of Systems  Eyes:  Negative for visual disturbance.  Respiratory:  Positive for shortness of breath. Negative for chest tightness.   Cardiovascular:  Negative for chest pain, palpitations and leg swelling.  Neurological:  Negative for dizziness, light-headedness and headaches.    Per HPI unless specifically indicated above     Objective:    BP (!) 100/59   Pulse 84   Temp 99.6 F (37.6 C) (Oral)   Wt 200 lb 11.2 oz (91 kg)   SpO2 96%   BMI 28.80 kg/m   Wt Readings from Last 3 Encounters:  02/28/23 200 lb 11.2 oz (91 kg)  02/22/23 200 lb 3.2 oz (90.8 kg)  01/12/23 200 lb (90.7 kg)    Physical Exam Vitals and nursing note reviewed.  Constitutional:      General: He is not  in acute distress.    Appearance: Normal appearance. He is not ill-appearing, toxic-appearing or diaphoretic.  HENT:     Head: Normocephalic.     Right Ear: External ear normal.     Left Ear: External ear normal.     Nose: Nose normal. No congestion or rhinorrhea.     Mouth/Throat:     Mouth: Mucous membranes are moist.  Eyes:     General:        Right eye: No discharge.        Left eye: No discharge.     Extraocular Movements: Extraocular movements intact.     Conjunctiva/sclera: Conjunctivae normal.     Pupils: Pupils are equal, round, and reactive to light.  Cardiovascular:      Rate and Rhythm: Normal rate. Rhythm irregular.     Heart sounds: No murmur heard. Pulmonary:     Effort: Pulmonary effort is normal. No respiratory distress.     Breath sounds: Normal breath sounds. No wheezing, rhonchi or rales.  Abdominal:     General: Abdomen is flat. Bowel sounds are normal.  Musculoskeletal:     Cervical back: Normal range of motion and neck supple.  Skin:    General: Skin is warm and dry.     Capillary Refill: Capillary refill takes less than 2 seconds.  Neurological:     General: No focal deficit present.     Mental Status: He is alert and oriented to person, place, and time.  Psychiatric:        Mood and Affect: Mood normal.        Behavior: Behavior normal.        Thought Content: Thought content normal.        Judgment: Judgment normal.     Results for orders placed or performed during the hospital encounter of 02/22/23  TSH  Result Value Ref Range   TSH 1.107 0.350 - 4.500 uIU/mL  CBC  Result Value Ref Range   WBC 5.4 4.0 - 10.5 K/uL   RBC 3.71 (L) 4.22 - 5.81 MIL/uL   Hemoglobin 11.4 (L) 13.0 - 17.0 g/dL   HCT 56.3 (L) 87.5 - 64.3 %   MCV 92.7 80.0 - 100.0 fL   MCH 30.7 26.0 - 34.0 pg   MCHC 33.1 30.0 - 36.0 g/dL   RDW 32.9 (H) 51.8 - 84.1 %   Platelets 117 (L) 150 - 400 K/uL   nRBC 0.0 0.0 - 0.2 %  Lipid panel  Result Value Ref Range   Cholesterol 204 (H) 0 - 200 mg/dL   Triglycerides 84 <660 mg/dL   HDL 88 >63 mg/dL   Total CHOL/HDL Ratio 2.3 RATIO   VLDL 17 0 - 40 mg/dL   LDL Cholesterol 99 0 - 99 mg/dL  B Nat Peptide  Result Value Ref Range   B Natriuretic Peptide 30.5 0.0 - 100.0 pg/mL  Basic Metabolic Panel (BMET)  Result Value Ref Range   Sodium 135 135 - 145 mmol/L   Potassium 5.1 3.5 - 5.1 mmol/L   Chloride 103 98 - 111 mmol/L   CO2 24 22 - 32 mmol/L   Glucose, Bld 117 (H) 70 - 99 mg/dL   BUN 54 (H) 6 - 20 mg/dL   Creatinine, Ser 0.16 0.61 - 1.24 mg/dL   Calcium 9.4 8.9 - 01.0 mg/dL   GFR, Estimated >93 >23 mL/min    Anion gap 8 5 - 15      Assessment & Plan:  Problem List Items Addressed This Visit       Cardiovascular and Mediastinum   Hypertension    Chronic. Improved.  Still on Entresto, Metoprolol, diltalizem and Lasix.  Blood pressure is well controlled in the office.  Patient does not check blood pressures at home.  Labs ordered today.  Keep appointments with Cardiology.  Follow up in 2 month.        Chronic heart failure with preserved ejection fraction - Primary    Chronic. Ongoing problem.  Recently saw Cardiology on 02/22/23.  Echo from January 24 showed EF of 50-55%.    New onset Afib during hospitalization secondary to seizures from alcohol consumption.  He was recently started on Coumadin.  He is not able to take other anticoagulants.  He is supposed to have a stress test done but doesn't have it scheduled.   Continue with Entresto, Metoprolol and Diltalizem.  Reviewed labs from last week.  Follow up in 2 month.        Persistent atrial fibrillation    Chronic. Followed by Cardiology.  Patient on Metoprolol and Warfarin.  Continue with current regimen.  Continue to follow up with Cardiology.         Respiratory   COPD (chronic obstructive pulmonary disease)    Chronic. Ongoing concern.  Cardiology suspects SOB is related to COPD.  Patient continues to smoke 2 cigars daily.  Recommend smoking cessation.  Discussed with patient the importance of seeing Pulmonology.  Patient agrees to see specialist.  New referral placed.  He has seen [name] in the past.         Nervous and Auditory   Seizure disorder    Chronic.  Secondary to alcoholism.  Counseled patient on alcohol cessation.  Discussed community resources available.  Spoke with Nurse from Dr. Margaretmary Eddy office who states he is okay with patient staying out of work at this time.  He was told her is not able to drive for 6 months.  Continue with Tegretol.          Hematopoietic and Hemostatic   Thrombocytopenia    Secondary to  alcoholism.  Discussed with patient today.  Recommend alcohol cessation.         Other   Alcohol abuse    Currently drinking 1/2 pint daily which is down from 1 pint.  Encouraged patient to continue to cut back gradually.  Discussed resources for patient during visit today.        Other Visit Diagnoses     Encounter for completion of form with patient       Work form completed for patient during visit today.        Follow up plan: Return in about 2 months (around 04/30/2023) for HTN, HLD, DM2 FU.

## 2023-03-01 NOTE — Assessment & Plan Note (Signed)
Chronic. Improved.  Still on Entresto, Metoprolol, diltalizem and Lasix.  Blood pressure is well controlled in the office.  Patient does not check blood pressures at home.  Labs ordered today.  Keep appointments with Cardiology.  Follow up in 2 month.

## 2023-03-01 NOTE — Assessment & Plan Note (Signed)
Chronic. Ongoing concern.  Cardiology suspects SOB is related to COPD.  Patient continues to smoke 2 cigars daily.  Recommend smoking cessation.  Discussed with patient the importance of seeing Pulmonology.  Patient agrees to see specialist.  New referral placed.  He has seen [name] in the past.

## 2023-03-01 NOTE — Assessment & Plan Note (Signed)
Secondary to alcoholism.  Discussed with patient today.  Recommend alcohol cessation.

## 2023-03-01 NOTE — Assessment & Plan Note (Signed)
Chronic.  Secondary to alcoholism.  Counseled patient on alcohol cessation.  Discussed community resources available.  Spoke with Nurse from Dr. Margaretmary Eddy office who states he is okay with patient staying out of work at this time.  He was told her is not able to drive for 6 months.  Continue with Tegretol.

## 2023-03-01 NOTE — Assessment & Plan Note (Signed)
Chronic. Followed by Cardiology.  Patient on Metoprolol and Warfarin.  Continue with current regimen.  Continue to follow up with Cardiology.

## 2023-03-01 NOTE — Assessment & Plan Note (Signed)
Currently drinking 1/2 pint daily which is down from 1 pint.  Encouraged patient to continue to cut back gradually.  Discussed resources for patient during visit today.

## 2023-03-06 NOTE — Telephone Encounter (Addendum)
Pt stated he is calling to f/u on his short-term disability forms. He stated he dropped off last week; it was supposed to be filled out last week.  Stated he needs to pick this up as soon as possible. Requesting a callback today.    Please advise.

## 2023-03-06 NOTE — Telephone Encounter (Signed)
Reached out to [facility]. They state that they have been trying to reach the patient to get him scheduled. Provided the phone number to [facility] to the patient's mother so they could call for scheduling.

## 2023-03-06 NOTE — Telephone Encounter (Signed)
Paperwork was completed and faxed back last week. Called patient back and notified him of this.

## 2023-03-07 NOTE — Telephone Encounter (Signed)
The patient called back returning Brittany's call and message from yesterday. He would like to speak with her or somebody regarding his paperwork. He said he is coming today to get a copy of it for his records.

## 2023-03-14 ENCOUNTER — Ambulatory Visit: Payer: BC Managed Care – PPO | Attending: Cardiology

## 2023-03-14 DIAGNOSIS — Z87891 Personal history of nicotine dependence: Secondary | ICD-10-CM | POA: Insufficient documentation

## 2023-03-14 DIAGNOSIS — J449 Chronic obstructive pulmonary disease, unspecified: Secondary | ICD-10-CM | POA: Diagnosis not present

## 2023-03-14 LAB — PULMONARY FUNCTION TEST ARMC ONLY
FEF 25-75 Pre: 0.83 L/sec
FEF2575-%Pred-Pre: 25 %
FEV1-%Change-Post: -32 %
FEV1-%Pred-Post: 19 %
FEV1-%Pred-Pre: 28 %
FEV1-Post: 0.74 L
FEV1-Pre: 1.1 L
FEV1FVC-%Change-Post: -4 %
FEV1FVC-%Pred-Pre: 61 %
FEV6-%Change-Post: -29 %
FEV6-%Pred-Post: 34 %
FEV6-%Pred-Pre: 48 %
FEV6-Post: 1.65 L
FEV6-Pre: 2.34 L
FEV6FVC-%Change-Post: 0 %
FEV6FVC-%Pred-Post: 103 %
FEV6FVC-%Pred-Pre: 103 %
FVC-%Change-Post: -29 %
FVC-%Pred-Post: 33 %
FVC-%Pred-Pre: 46 %
FVC-Post: 1.65 L
FVC-Pre: 2.34 L
Post FEV1/FVC ratio: 45 %
Post FEV6/FVC ratio: 100 %
Pre FEV1/FVC ratio: 47 %
Pre FEV6/FVC Ratio: 100 %

## 2023-03-14 MED ORDER — ALBUTEROL SULFATE (2.5 MG/3ML) 0.083% IN NEBU
2.5000 mg | INHALATION_SOLUTION | Freq: Once | RESPIRATORY_TRACT | Status: AC
  Administered 2023-03-14: 2.5 mg via RESPIRATORY_TRACT
  Filled 2023-03-14: qty 3

## 2023-03-15 ENCOUNTER — Ambulatory Visit: Payer: BC Managed Care – PPO | Attending: Internal Medicine | Admitting: *Deleted

## 2023-03-15 ENCOUNTER — Other Ambulatory Visit: Payer: Self-pay | Admitting: Internal Medicine

## 2023-03-15 DIAGNOSIS — I4819 Other persistent atrial fibrillation: Secondary | ICD-10-CM

## 2023-03-15 DIAGNOSIS — Z5181 Encounter for therapeutic drug level monitoring: Secondary | ICD-10-CM

## 2023-03-15 DIAGNOSIS — I5032 Chronic diastolic (congestive) heart failure: Secondary | ICD-10-CM

## 2023-03-15 LAB — POCT INR: INR: 1.5 — AB (ref 2.0–3.0)

## 2023-03-15 NOTE — Patient Instructions (Signed)
Increase warfarin back to 1 tablet daily. Recheck in 2 weeks. Keep your intake of greens consistent Please call the Coumadin Clinic with any questions at [phone]

## 2023-03-21 ENCOUNTER — Encounter
Admission: RE | Admit: 2023-03-21 | Discharge: 2023-03-21 | Disposition: A | Discharge status: Home / Self Care | Payer: BC Managed Care – PPO | Source: Ambulatory Visit | Attending: Cardiology | Admitting: Cardiology

## 2023-03-21 DIAGNOSIS — I5022 Chronic systolic (congestive) heart failure: Secondary | ICD-10-CM | POA: Diagnosis not present

## 2023-03-21 LAB — NM MYOCAR MULTI W/SPECT W/WALL MOTION / EF
Percent HR: 56 %
SDS: 0

## 2023-03-21 MED ORDER — TECHNETIUM TC 99M TETROFOSMIN IV KIT
28.9600 | PACK | Freq: Once | INTRAVENOUS | Status: AC | PRN
  Administered 2023-03-21: 28.96 via INTRAVENOUS

## 2023-03-21 MED ORDER — REGADENOSON 0.4 MG/5ML IV SOLN
0.4000 mg | Freq: Once | INTRAVENOUS | Status: AC
  Administered 2023-03-21: 0.4 mg via INTRAVENOUS

## 2023-03-21 MED ORDER — TECHNETIUM TC 99M TETROFOSMIN IV KIT
10.0000 | PACK | Freq: Once | INTRAVENOUS | Status: AC | PRN
  Administered 2023-03-21: 10.53 via INTRAVENOUS

## 2023-03-22 LAB — NM MYOCAR MULTI W/SPECT W/WALL MOTION / EF
Base ST Depression (mm): 0 mm
LV dias vol: 60 mL (ref 62–150)
LV sys vol: 33 mL
Nuc Stress EF: 45 %
Peak HR: 94 {beats}/min
Rest HR: 67 {beats}/min
Rest Nuclear Isotope Dose: 10.5 mCi
SRS: 1
SSS: 1
ST Depression (mm): 0 mm
Stress Nuclear Isotope Dose: 29 mCi
TID: 0.69

## 2023-03-27 DIAGNOSIS — I5032 Chronic diastolic (congestive) heart failure: Secondary | ICD-10-CM | POA: Diagnosis not present

## 2023-03-27 DIAGNOSIS — D500 Iron deficiency anemia secondary to blood loss (chronic): Secondary | ICD-10-CM | POA: Diagnosis not present

## 2023-03-27 DIAGNOSIS — I509 Heart failure, unspecified: Secondary | ICD-10-CM | POA: Diagnosis not present

## 2023-03-27 DIAGNOSIS — F10920 Alcohol use, unspecified with intoxication, uncomplicated: Secondary | ICD-10-CM | POA: Diagnosis not present

## 2023-03-29 ENCOUNTER — Ambulatory Visit: Payer: BC Managed Care – PPO | Attending: Internal Medicine

## 2023-03-29 DIAGNOSIS — Z7901 Long term (current) use of anticoagulants: Secondary | ICD-10-CM

## 2023-03-29 DIAGNOSIS — I4819 Other persistent atrial fibrillation: Secondary | ICD-10-CM | POA: Diagnosis not present

## 2023-03-29 LAB — POCT INR: INR: 1.8 — AB (ref 2.0–3.0)

## 2023-03-29 MED ORDER — WARFARIN SODIUM 5 MG PO TABS
5.0000 mg | ORAL_TABLET | ORAL | 3 refills | Status: DC
Start: 2023-03-29 — End: 2023-07-26

## 2023-03-29 NOTE — Patient Instructions (Signed)
Take 2 tablets to day and then increase to  1 tablet daily, except 2 tablets every Wednesday Recheck in 2 weeks. Keep your intake of greens consistent Please call the Coumadin Clinic with any questions at [phone]

## 2023-04-07 ENCOUNTER — Encounter: Payer: BC Managed Care – PPO | Admitting: Cardiology

## 2023-04-10 ENCOUNTER — Other Ambulatory Visit: Payer: Self-pay | Admitting: Nurse Practitioner

## 2023-04-10 ENCOUNTER — Ambulatory Visit: Payer: BC Managed Care – PPO | Attending: Family | Admitting: Family

## 2023-04-10 ENCOUNTER — Encounter: Payer: Self-pay | Admitting: Family

## 2023-04-10 VITALS — BP 98/60 | HR 64 | Wt 205.0 lb

## 2023-04-10 DIAGNOSIS — J449 Chronic obstructive pulmonary disease, unspecified: Secondary | ICD-10-CM | POA: Insufficient documentation

## 2023-04-10 DIAGNOSIS — Z79899 Other long term (current) drug therapy: Secondary | ICD-10-CM | POA: Diagnosis not present

## 2023-04-10 DIAGNOSIS — I110 Hypertensive heart disease with heart failure: Secondary | ICD-10-CM | POA: Diagnosis not present

## 2023-04-10 DIAGNOSIS — F1010 Alcohol abuse, uncomplicated: Secondary | ICD-10-CM | POA: Insufficient documentation

## 2023-04-10 DIAGNOSIS — R569 Unspecified convulsions: Secondary | ICD-10-CM | POA: Diagnosis not present

## 2023-04-10 DIAGNOSIS — R0789 Other chest pain: Secondary | ICD-10-CM | POA: Diagnosis not present

## 2023-04-10 DIAGNOSIS — E785 Hyperlipidemia, unspecified: Secondary | ICD-10-CM | POA: Diagnosis not present

## 2023-04-10 DIAGNOSIS — I4819 Other persistent atrial fibrillation: Secondary | ICD-10-CM | POA: Diagnosis not present

## 2023-04-10 DIAGNOSIS — I10 Essential (primary) hypertension: Secondary | ICD-10-CM

## 2023-04-10 DIAGNOSIS — F17290 Nicotine dependence, other tobacco product, uncomplicated: Secondary | ICD-10-CM | POA: Insufficient documentation

## 2023-04-10 DIAGNOSIS — Z7901 Long term (current) use of anticoagulants: Secondary | ICD-10-CM | POA: Insufficient documentation

## 2023-04-10 DIAGNOSIS — I5032 Chronic diastolic (congestive) heart failure: Secondary | ICD-10-CM | POA: Diagnosis not present

## 2023-04-10 DIAGNOSIS — I509 Heart failure, unspecified: Secondary | ICD-10-CM | POA: Diagnosis not present

## 2023-04-10 NOTE — Patient Instructions (Signed)
Decrease spironolactone to 1/2 tablet every day 

## 2023-04-10 NOTE — Progress Notes (Signed)
PCP: [name], NP (last seen 04/24) Primary Cardiologist: none HF provider: Marca Ancona, MD (last seen 04/24)  HPI:  Jose [name] is a 52 y/o male with a history of hyperlipidemia, HTN, COPD, seizures, persistent atrial fibrillation, substantial alcohol use, tobacco use and chronic heart failure.   TEE 01/12/23: EF 55% along with mild LAE. Echo 11/11/22: EF of 50-55% along with mild LVH, mildly elevated PA pressure, moderate LAE/RAE and trivial TR. Echo 11/05/21: EF of 60-65% along with moderate LVH. Echo 01/29/2019: EF of 60-65%  Admitted 11/10/22 due to seizures. He presented hypotensive to 60s/40s with new onset atrial fibrillation with RVR. TTE during admit with LVEF of 50-55%, moderate biatrial enlargement, normal RV. He was seen by neurology for possible alcohol withdrawal related seizures and continued on home tegretol (undetectable levels on admit). For his atrial fibrillation he was discharged home on cardizem & lopressor. Anticoagulation was not started due to concern for continued alcohol abuse and thrombocytopenia.  Patient was ultimately started on warfarin and had TEE-guided DCCV in 3/24.  TEE showed EF 55%, normal RV, mild LAE.    He presents today for a HF follow-up visit with a chief complaint of moderate SOB with minimal exertion. Chronic in nature. Has associated intermittent chest pain, palpitations, dizziness and fatigue along with this. Symptoms worse with walking up stairs, carrying items or walking long distances, such as to his mailbox.  Still smoking 1-2 cigars/day. Continue to drink liquor ~ 1 pint daily. Mom does his pill box for him.     ROS: All systems negative except as listed in HPI, PMH and Problem List.  SH:  Social History   Socioeconomic History   Marital status: Widowed    Spouse name: Not on file   Number of children: Not on file   Years of education: Not on file   Highest education level: Not on file  Occupational History   Not on file  Tobacco  Use   Smoking status: Every Day    Types: Cigars    Start date: 2020   Smokeless tobacco: Former   Tobacco comments:    2 cigars a day-05/05/2022    Quit smoking cigarettes 3 years ago.  Smokes cigarettes since he was 52 years old.  Vaping Use   Vaping Use: Never used  Substance and Sexual Activity   Alcohol use: Yes    Alcohol/week: 28.0 standard drinks of alcohol    Types: 7 Cans of beer, 21 Shots of liquor per week    Comment: pint of liquor per day   Drug use: No    Types: Marijuana    Comment: quit back in the late 90's   Sexual activity: Yes  Other Topics Concern   Not on file  Social History Narrative   Lives with his Mother Jose [name]    Social Determinants of Health   Financial Resource Strain: Low Risk  (07/01/2019)   Overall Financial Resource Strain (CARDIA)    Difficulty of Paying Living Expenses: Not hard at all  Food Insecurity: No Food Insecurity (11/11/2022)   Hunger Vital Sign    Worried About Running Out of Food in the Last Year: Never true    Ran Out of Food in the Last Year: Never true  Transportation Needs: No Transportation Needs (11/11/2022)   PRAPARE - Administrator, Civil Service (Medical): No    Lack of Transportation (Non-Medical): No  Physical Activity: Sufficiently Active (07/01/2019)   Exercise Vital Sign    Days  of Exercise per Week: 3 days    Minutes of Exercise per Session: 50 min  Stress: No Stress Concern Present (09/02/2019)   [employer] of Occupational Health - Occupational Stress Questionnaire    Feeling of Stress : Not at all  Social Connections: Moderately Isolated (07/01/2019)   Social Connection and Isolation Panel [NHANES]    Frequency of Communication with Friends and Family: Three times a week    Frequency of Social Gatherings with Friends and Family: Three times a week    Attends Religious Services: 1 to 4 times per year    Active Member of Clubs or Organizations: No    Attends Banker Meetings:  Never    Marital Status: Widowed  Intimate Partner Violence: Not At Risk (11/11/2022)   Humiliation, Afraid, Rape, and Kick questionnaire    Fear of Current or Ex-Partner: No    Emotionally Abused: No    Physically Abused: No    Sexually Abused: No    FH:  Family History  Problem Relation Age of Onset   Hypertension Mother    Diabetes Mother    Colon cancer Mother    Hypertension Father    Multiple sclerosis Father     Past Medical History:  Diagnosis Date   Alcohol abuse    Cervicalgia    CHF (congestive heart failure) (HCC)    COPD (chronic obstructive pulmonary disease) (HCC)    Hyperlipidemia    Hypertension    Seizures (HCC)     Current Outpatient Medications  Medication Sig Dispense Refill   albuterol (VENTOLIN HFA) 108 (90 Base) MCG/ACT inhaler Inhale 2 puffs into the lungs every 6 (six) hours as needed for wheezing or shortness of breath. 8 g 2   carbamazepine (TEGRETOL) 200 MG tablet Take 1 tablet (200 mg total) by mouth 2 (two) times daily. 180 tablet 1   diltiazem (CARDIZEM CD) 360 MG 24 hr capsule Take 1 capsule (360 mg total) by mouth daily. 90 capsule 1   empagliflozin (JARDIANCE) 10 MG TABS tablet Take 1 tablet (10 mg total) by mouth daily before breakfast. 90 tablet 1   Fluticasone-Umeclidin-Vilant (TRELEGY ELLIPTA) 100-62.5-25 MCG/ACT AEPB Inhale 1 puff into the lungs daily. 14 each 0   furosemide (LASIX) 40 MG tablet Take 1 tablet (40 mg total) by mouth every morning. 30 tablet 3   Iron, Ferrous Sulfate, 325 (65 Fe) MG TABS Take 325 mg by mouth daily. 90 tablet 1   Magnesium Oxide -Mg Supplement (MAG-OXIDE) 200 MG TABS Take 1 tablet (200 mg total) by mouth 2 (two) times daily. 180 tablet 1   metoprolol tartrate (LOPRESSOR) 25 MG tablet Take 3 tablets (75 mg total) by mouth 2 (two) times daily. 180 tablet 3   potassium chloride (KLOR-CON) 10 MEQ tablet Take 20 mEq by mouth daily.     rosuvastatin (CRESTOR) 20 MG tablet Take 1 tablet (20 mg total) by mouth  daily. 90 tablet 1   sacubitril-valsartan (ENTRESTO) 49-51 MG Take 1 tablet by mouth 2 (two) times daily. 180 tablet 1   spironolactone (ALDACTONE) 25 MG tablet Take 1 tablet (25 mg total) by mouth daily. 90 tablet 3   warfarin (COUMADIN) 5 MG tablet Take 1 tablet (5 mg total) by mouth as directed. 30 tablet 3   No current facility-administered medications for this visit.   Vitals:   04/10/23 1509  BP: 98/60  Pulse: 64  SpO2: 97%  Weight: 205 lb (93 kg)   Wt Readings from Last 3  Encounters:  04/10/23 205 lb (93 kg)  02/28/23 200 lb 11.2 oz (91 kg)  02/22/23 200 lb 3.2 oz (90.8 kg)   Lab Results  Component Value Date   CREATININE 1.10 02/22/2023   CREATININE 1.49 (H) 12/29/2022   CREATININE 0.78 12/19/2022   PHYSICAL EXAM:  General:  Well appearing. No resp difficulty HEENT: normal, HOH Neck: supple. JVP flat. No lymphadenopathy or thryomegaly appreciated. Cor: PMI normal. Regular rate & rhythm. No rubs, gallops or murmurs. Lungs: clear Abdomen: soft, nontender, nondistended. No hepatosplenomegaly. No bruits or masses.  Extremities: no cyanosis, clubbing, rash, edema Neuro: alert & oriented x3, cranial nerves grossly intact. Moves all 4 extremities w/o difficulty. Affect pleasant.   ECG: today is NSR with HR 68   ASSESSMENT & PLAN:  1. Chronic heart failure with preserved ejection fraction- - HF likely due to alcohol use - NYHA class III - euvolemic today - weighing daily; reminded to call for an overnight weight gain of > 2 pounds or a weekly weight gain of > 5 pounds - weight up 5 pounds from last visit here 6 weeks ago - TEE 01/12/23: EF 55% along with mild LAE.  - Echo 11/11/22: EF of 50-55% along with mild LVH, mildly elevated PA pressure, moderate LAE/RAE and trivial TR.  - Echo 11/05/21: EF of 60-65% along with moderate LVH.  - Echo 01/29/2019: EF of 60-65% - adding some salt but says that it's less than he was - continue jardiance 10mg  daily - continue entresto  49/51mg  BID - metoprolol tartrate 75mg  BID - decrease spironolactone to 12.5mg  daily due to hypotension - continue furosemide 40mg  AM - saw [facility] provider Shirlee Latch) 04/24 - BNP 12/19/22 was 62.3  2: HTN- - BP 98/60, rechecked w/ manual cuff was 94/60 - decreasing spironolactone per above - saw PCP Caren Griffins) 04/24 - BMP from 02/22/23 reviewed and showed sodium 135, potassium 5.1, creatinine 1.10 and GFR >60  3: Atrial fibrillation- - unfortunately, he is on Tegretol so cannot take Eliquis, Xarelto, or Pradaxa.   - INR 03/29/23 was 1.8 - continue diltiazem CD 360mg  daily - continue metoprolol tartrate 75mg  BID - EKG in office today was NSR with HR 68  4: COPD-  - still smoking 1-2 cigars a day - complete cessation discussed with him - saw pulmonology Jayme Cloud) 06/23  5: Chronic alcohol use- - back to drinking 1 pint of vodka daily - if he can decrease consumption, refer to EP for AF ablation - platelets 117K 02/22/23; prior one was 85K  6: Seizures- - continue tegretol 200mg  BID - saw neurology Sherryll Burger) 03/24  Return in 3 weeks, sooner if needed.

## 2023-04-11 NOTE — Telephone Encounter (Signed)
Requested Prescriptions  Pending Prescriptions Disp Refills   SV IRON 325 (65 Fe) MG tablet [Pharmacy Med Name: SV Iron 325 MG Oral Tablet] 90 tablet 3    Sig: Take 1 tablet by mouth once daily     Endocrinology:  Minerals - Iron Supplementation Failed - 04/10/2023  4:48 PM      Failed - HGB in normal range and within 360 days    Hemoglobin  Date Value Ref Range Status  02/22/2023 11.4 (L) 13.0 - 17.0 g/dL Final  16/08/9603 54.0 (L) 13.0 - 17.7 g/dL Final         Failed - HCT in normal range and within 360 days    HCT  Date Value Ref Range Status  02/22/2023 34.4 (L) 39.0 - 52.0 % Final   Hematocrit  Date Value Ref Range Status  11/24/2022 37.2 (L) 37.5 - 51.0 % Final         Failed - RBC in normal range and within 360 days    RBC  Date Value Ref Range Status  02/22/2023 3.71 (L) 4.22 - 5.81 MIL/uL Final         Failed - Fe (serum) in normal range and within 360 days    Iron  Date Value Ref Range Status  03/10/2022 58 38 - 169 ug/dL Final   Iron Saturation  Date Value Ref Range Status  03/10/2022 18 15 - 55 % Final         Failed - Ferritin in normal range and within 360 days    Ferritin  Date Value Ref Range Status  03/10/2022 116 30 - 400 ng/mL Final         Passed - Valid encounter within last 12 months    Recent Outpatient Visits           1 month ago Chronic heart failure with preserved ejection fraction [facility])   [place] [facility] [name], NP   3 months ago Hypomagnesemia   [place] [facility] [name], NP   4 months ago Hospital discharge follow-up   [place] [facility] [name], NP   7 months ago Primary hypertension   [place] [facility] [name], NP   9 months ago Alcohol abuse   [place] [facility] [name], NP       Future Appointments             In 2 weeks [name], NP [place] [facility]

## 2023-04-12 ENCOUNTER — Ambulatory Visit: Payer: BC Managed Care – PPO | Attending: Internal Medicine

## 2023-04-12 DIAGNOSIS — Z7901 Long term (current) use of anticoagulants: Secondary | ICD-10-CM | POA: Diagnosis not present

## 2023-04-12 DIAGNOSIS — I4819 Other persistent atrial fibrillation: Secondary | ICD-10-CM

## 2023-04-12 LAB — POCT INR: INR: 3.7 — AB (ref 2.0–3.0)

## 2023-04-12 NOTE — Patient Instructions (Signed)
HOLD TODAY ONLY THEN CONTINUE 1 tablet daily, except 2 tablets every Wednesday Recheck in 2 weeks. Keep your intake of greens consistent (2 HELPINGS OF GREENS PER WEEK) Please call the Coumadin Clinic with any questions at [phone]

## 2023-04-21 ENCOUNTER — Other Ambulatory Visit: Payer: Self-pay | Admitting: Cardiology

## 2023-04-21 DIAGNOSIS — I4819 Other persistent atrial fibrillation: Secondary | ICD-10-CM

## 2023-04-25 ENCOUNTER — Ambulatory Visit (INDEPENDENT_AMBULATORY_CARE_PROVIDER_SITE_OTHER): Payer: BC Managed Care – PPO | Admitting: Nurse Practitioner

## 2023-04-25 ENCOUNTER — Encounter: Payer: Self-pay | Admitting: Nurse Practitioner

## 2023-04-25 VITALS — BP 104/60 | HR 82 | Temp 98.2°F | Wt 200.2 lb

## 2023-04-25 DIAGNOSIS — E7800 Pure hypercholesterolemia, unspecified: Secondary | ICD-10-CM

## 2023-04-25 DIAGNOSIS — D696 Thrombocytopenia, unspecified: Secondary | ICD-10-CM | POA: Diagnosis not present

## 2023-04-25 DIAGNOSIS — I5032 Chronic diastolic (congestive) heart failure: Secondary | ICD-10-CM | POA: Diagnosis not present

## 2023-04-25 DIAGNOSIS — I10 Essential (primary) hypertension: Secondary | ICD-10-CM

## 2023-04-25 DIAGNOSIS — I4819 Other persistent atrial fibrillation: Secondary | ICD-10-CM | POA: Diagnosis not present

## 2023-04-25 DIAGNOSIS — G40909 Epilepsy, unspecified, not intractable, without status epilepticus: Secondary | ICD-10-CM | POA: Diagnosis not present

## 2023-04-25 DIAGNOSIS — J449 Chronic obstructive pulmonary disease, unspecified: Secondary | ICD-10-CM

## 2023-04-25 MED ORDER — EMPAGLIFLOZIN 10 MG PO TABS: 10.0000 mg | ORAL_TABLET | Freq: Every day | ORAL | 1 refills | Status: DC

## 2023-04-25 MED ORDER — MAG-OXIDE 200 MG PO TABS
200.0000 mg | ORAL_TABLET | Freq: Two times a day (BID) | ORAL | 1 refills | Status: DC
Start: 2023-04-25 — End: 2023-07-27

## 2023-04-25 MED ORDER — SACUBITRIL-VALSARTAN 49-51 MG PO TABS: 1.0000 | ORAL_TABLET | Freq: Two times a day (BID) | ORAL | 1 refills | Status: DC

## 2023-04-25 MED ORDER — CARBAMAZEPINE 200 MG PO TABS: 200.0000 mg | ORAL_TABLET | Freq: Two times a day (BID) | ORAL | 1 refills | Status: AC

## 2023-04-25 NOTE — Assessment & Plan Note (Signed)
Chronic. Followed by Cardiology.  Patient on Metoprolol and Warfarin.  INR checked by Cardiology.  Continue with current regimen.  Continue to follow up with Cardiology.

## 2023-04-25 NOTE — Assessment & Plan Note (Signed)
Chronic.  Secondary to alcoholism.  Counseled patient on alcohol cessation.  Discussed community resources available.  Continue with Tegretol.  Levels checked at visit today.  Continue to follow up with Neurology. He was told her is not able to drive for 6 months.

## 2023-04-25 NOTE — Assessment & Plan Note (Signed)
Chronic. Ongoing concern.  Cardiology suspects SOB is related to COPD.  Patient continues to smoke 2 cigars daily.  Recommend smoking cessation.  PFTs showed severe COPD.  Discussed importance of Treelegy.  States he does have Oxygen at home but doesn't use it very often.  Encouraged patient to get a pulse Ox and monitor O2 sats.  Discussed with patient the importance of seeing Pulmonology.  Patient agrees to see specialist.  New referral placed.  He has seen [name] in the past.  Would also benefit from a sleep study- will defer to Pulmonology.

## 2023-04-25 NOTE — Assessment & Plan Note (Signed)
Chronic. Ongoing problem.  Recently saw Cardiology on 02/22/23 and HF clinic 04/12/23.  Echo from January 24 showed EF of 50-55%.    New onset Afib during hospitalization secondary to seizures from alcohol consumption.  Continues on coumadin with INR checks from Cardiology.  He is not able to take other anticoagulants due to interaction with Tegretol.  He completed his stress test and PFTs.   Continue with Entresto, Metoprolol and Diltalizem.  Reviewed labs from last week.  Follow up in 3 months.

## 2023-04-25 NOTE — Progress Notes (Signed)
BP 104/60   Pulse 82   Temp 98.2 F (36.8 C) (Oral)   Wt 200 lb 3.2 oz (90.8 kg)   SpO2 96%   BMI 28.73 kg/m    Subjective:    Patient ID: Jose [name]., male    DOB: [birth year], 52 y.o.   MRN: [id number]  HPI: Izmael Bardon. is a 52 y.o. male  Chief Complaint  Patient presents with   Hypertension   HYPERTENSION / HYPERLIPIDEMIA/HF Patient has seen Cardiology recently.  Started on Warfarin for anticoagulation.  Has been attending INR check appts.   He continues to have ongoing exertional SOB.  PFTs completed and showed severe COPD. Has not seen pulm.  He does have NYHA class III symptoms. He appears euvolemic at visit today.  Has lost 5lbs since HF clinic appt two weeks ago. Satisfied with current treatment? yes Duration of hypertension: years BP monitoring frequency: not checking BP range:  BP medication side effects: no Past BP meds:  Lasix, amlodipine, carvedilol, and lisinopril Duration of hyperlipidemia: years Cholesterol medication side effects: no Cholesterol supplements: none Past cholesterol medications: rosuvastatin (crestor) Medication compliance: excellent compliance Aspirin: no Recent stressors: no Recurrent headaches: no Visual changes: no Palpitations: no Dyspnea: yes Chest pain: no Lower extremity edema: no Dizzy/lightheaded: no  ALCOHOL USE Drinking about a half a pint a day.  He was previously drinking 1 pint daily.   COPD Ongoing exertional dyspnea.  Per Cardiology's note they believe it is more related to COPD.  They have ordered PFTs but patient hasn't been scheduled for this yet.  He has not seen Pulmonology but did get the PFTs done at the hospital which showed Severe COPD. COPD status: exacerbated Satisfied with current treatment?: no Oxygen use: no Dyspnea frequency: with exertion Cough frequency: no Rescue inhaler frequency:   Limitation of activity: yes Productive cough:  Last Spirometry:  Pneumovax: Up to Date Influenza: Up to  Date Still smokes 2 cigars daily.       Relevant past medical, surgical, family and social history reviewed and updated as indicated. Interim medical history since our last visit reviewed. Allergies and medications reviewed and updated.  Review of Systems  Eyes:  Negative for visual disturbance.  Respiratory:  Positive for shortness of breath. Negative for chest tightness.   Cardiovascular:  Negative for chest pain, palpitations and leg swelling.  Neurological:  Negative for dizziness, light-headedness and headaches.    Per HPI unless specifically indicated above     Objective:    BP 104/60   Pulse 82   Temp 98.2 F (36.8 C) (Oral)   Wt 200 lb 3.2 oz (90.8 kg)   SpO2 96%   BMI 28.73 kg/m   Wt Readings from Last 3 Encounters:  04/25/23 200 lb 3.2 oz (90.8 kg)  04/10/23 205 lb (93 kg)  02/28/23 200 lb 11.2 oz (91 kg)    Physical Exam Vitals and nursing note reviewed.  Constitutional:      General: He is not in acute distress.    Appearance: Normal appearance. He is not ill-appearing, toxic-appearing or diaphoretic.  HENT:     Head: Normocephalic.     Right Ear: External ear normal.     Left Ear: External ear normal.     Nose: Nose normal. No congestion or rhinorrhea.     Mouth/Throat:     Mouth: Mucous membranes are moist.  Eyes:     General:        Right eye: No  discharge.        Left eye: No discharge.     Extraocular Movements: Extraocular movements intact.     Conjunctiva/sclera: Conjunctivae normal.     Pupils: Pupils are equal, round, and reactive to light.  Cardiovascular:     Rate and Rhythm: Normal rate. Rhythm irregular.     Heart sounds: No murmur heard. Pulmonary:     Effort: Pulmonary effort is normal. No respiratory distress.     Breath sounds: Wheezing present. No rhonchi or rales.  Abdominal:     General: Abdomen is flat. Bowel sounds are normal.  Musculoskeletal:     Cervical back: Normal range of motion and neck supple.  Skin:     General: Skin is warm and dry.     Capillary Refill: Capillary refill takes less than 2 seconds.  Neurological:     General: No focal deficit present.     Mental Status: He is alert and oriented to person, place, and time.  Psychiatric:        Mood and Affect: Mood normal.        Behavior: Behavior normal.        Thought Content: Thought content normal.        Judgment: Judgment normal.     Results for orders placed or performed in visit on 04/12/23  POCT INR  Result Value Ref Range   INR 3.7 (A) 2.0 - 3.0   POC INR        Assessment & Plan:   Problem List Items Addressed This Visit       Cardiovascular and Mediastinum   Hypertension    Chronic.  Controlled. Still on Entresto, Metoprolol, diltalizem and Lasix.  Blood pressure is well controlled in the office.  Patient does not check blood pressures at home.  Labs ordered today.  Keep appointments with Cardiology.  Follow up in 3 months.        Relevant Medications   sacubitril-valsartan (ENTRESTO) 49-51 MG   Other Relevant Orders   Comp Met (CMET)   Chronic heart failure with preserved ejection fraction (HCC)    Chronic. Ongoing problem.  Recently saw Cardiology on 02/22/23 and HF clinic 04/12/23.  Echo from January 24 showed EF of 50-55%.    New onset Afib during hospitalization secondary to seizures from alcohol consumption.  Continues on coumadin with INR checks from Cardiology.  He is not able to take other anticoagulants due to interaction with Tegretol.  He completed his stress test and PFTs.   Continue with Entresto, Metoprolol and Diltalizem.  Reviewed labs from last week.  Follow up in 3 months.        Relevant Medications   sacubitril-valsartan (ENTRESTO) 49-51 MG   Persistent atrial fibrillation (HCC) - Primary    Chronic. Followed by Cardiology.  Patient on Metoprolol and Warfarin.  INR checked by Cardiology.  Continue with current regimen.  Continue to follow up with Cardiology.       Relevant Medications    sacubitril-valsartan (ENTRESTO) 49-51 MG   Magnesium Oxide -Mg Supplement (MAG-OXIDE) 200 MG TABS     Respiratory   COPD (chronic obstructive pulmonary disease) (HCC)    Chronic. Ongoing concern.  Cardiology suspects SOB is related to COPD.  Patient continues to smoke 2 cigars daily.  Recommend smoking cessation.  PFTs showed severe COPD.  Discussed importance of Treelegy.  States he does have Oxygen at home but doesn't use it very often.  Encouraged patient to get a pulse Ox and  monitor O2 sats.  Discussed with patient the importance of seeing Pulmonology.  Patient agrees to see specialist.  New referral placed.  He has seen [name] in the past.  Would also benefit from a sleep study- will defer to Pulmonology.      Relevant Orders   Ambulatory referral to Pulmonology     Nervous and Auditory   Seizure disorder (HCC)    Chronic.  Secondary to alcoholism.  Counseled patient on alcohol cessation.  Discussed community resources available.  Continue with Tegretol.  Levels checked at visit today.  Continue to follow up with Neurology. He was told her is not able to drive for 6 months.        Relevant Medications   carbamazepine (TEGRETOL) 200 MG tablet   Other Relevant Orders   Carbamazepine Level (Tegretol), total     Hematopoietic and Hemostatic   Thrombocytopenia (HCC)    Secondary to alcoholism.  Discussed with patient today.  Recommend alcohol cessation.       Relevant Orders   CBC w/Diff     Other   Hypercholesterolemia    Chronic.  Controlled.  Continue with current medication regimen.  Labs ordered today.  Return to clinic in 6 months for reevaluation.  Call sooner if concerns arise.        Relevant Medications   sacubitril-valsartan (ENTRESTO) 49-51 MG   Other Relevant Orders   Lipid Profile   Hypomagnesemia    Labs ordered at visit today.  Will make recommendations based on lab results.        Relevant Orders   Magnesium     Follow up plan: Return in about  3 months (around 07/26/2023) for HTN, HLD, DM2 FU.

## 2023-04-25 NOTE — Assessment & Plan Note (Signed)
Chronic.  Controlled. Still on Entresto, Metoprolol, diltalizem and Lasix.  Blood pressure is well controlled in the office.  Patient does not check blood pressures at home.  Labs ordered today.  Keep appointments with Cardiology.  Follow up in 3 months.

## 2023-04-25 NOTE — Assessment & Plan Note (Signed)
Chronic.  Controlled.  Continue with current medication regimen.  Labs ordered today.  Return to clinic in 6 months for reevaluation.  Call sooner if concerns arise.  ? ?

## 2023-04-25 NOTE — Assessment & Plan Note (Signed)
Secondary to alcoholism.  Discussed with patient today.  Recommend alcohol cessation.  

## 2023-04-25 NOTE — Assessment & Plan Note (Signed)
Labs ordered at visit today.  Will make recommendations based on lab results.   

## 2023-04-26 ENCOUNTER — Ambulatory Visit: Payer: BC Managed Care – PPO

## 2023-04-26 ENCOUNTER — Encounter: Payer: Self-pay | Admitting: Internal Medicine

## 2023-04-26 ENCOUNTER — Observation Stay
Admission: EM | Admit: 2023-04-26 | Discharge: 2023-04-28 | Disposition: A | Discharge status: Home / Self Care | Payer: BC Managed Care – PPO | Attending: Internal Medicine | Admitting: Internal Medicine

## 2023-04-26 ENCOUNTER — Emergency Department: Payer: BC Managed Care – PPO

## 2023-04-26 ENCOUNTER — Other Ambulatory Visit: Payer: Self-pay

## 2023-04-26 DIAGNOSIS — F17290 Nicotine dependence, other tobacco product, uncomplicated: Secondary | ICD-10-CM | POA: Diagnosis not present

## 2023-04-26 DIAGNOSIS — E875 Hyperkalemia: Secondary | ICD-10-CM | POA: Insufficient documentation

## 2023-04-26 DIAGNOSIS — I5032 Chronic diastolic (congestive) heart failure: Secondary | ICD-10-CM | POA: Diagnosis not present

## 2023-04-26 DIAGNOSIS — Z7901 Long term (current) use of anticoagulants: Secondary | ICD-10-CM | POA: Insufficient documentation

## 2023-04-26 DIAGNOSIS — J449 Chronic obstructive pulmonary disease, unspecified: Secondary | ICD-10-CM | POA: Insufficient documentation

## 2023-04-26 DIAGNOSIS — K640 First degree hemorrhoids: Secondary | ICD-10-CM | POA: Diagnosis not present

## 2023-04-26 DIAGNOSIS — I4819 Other persistent atrial fibrillation: Secondary | ICD-10-CM | POA: Diagnosis not present

## 2023-04-26 DIAGNOSIS — I10 Essential (primary) hypertension: Secondary | ICD-10-CM | POA: Diagnosis not present

## 2023-04-26 DIAGNOSIS — K31819 Angiodysplasia of stomach and duodenum without bleeding: Secondary | ICD-10-CM | POA: Insufficient documentation

## 2023-04-26 DIAGNOSIS — K921 Melena: Secondary | ICD-10-CM | POA: Diagnosis not present

## 2023-04-26 DIAGNOSIS — D649 Anemia, unspecified: Secondary | ICD-10-CM | POA: Diagnosis not present

## 2023-04-26 DIAGNOSIS — K2950 Unspecified chronic gastritis without bleeding: Secondary | ICD-10-CM | POA: Diagnosis not present

## 2023-04-26 DIAGNOSIS — K5731 Diverticulosis of large intestine without perforation or abscess with bleeding: Secondary | ICD-10-CM | POA: Diagnosis not present

## 2023-04-26 DIAGNOSIS — K922 Gastrointestinal hemorrhage, unspecified: Secondary | ICD-10-CM | POA: Diagnosis not present

## 2023-04-26 DIAGNOSIS — D62 Acute posthemorrhagic anemia: Secondary | ICD-10-CM | POA: Insufficient documentation

## 2023-04-26 DIAGNOSIS — Z7952 Long term (current) use of systemic steroids: Secondary | ICD-10-CM | POA: Diagnosis not present

## 2023-04-26 DIAGNOSIS — I110 Hypertensive heart disease with heart failure: Secondary | ICD-10-CM | POA: Diagnosis not present

## 2023-04-26 DIAGNOSIS — G40909 Epilepsy, unspecified, not intractable, without status epilepticus: Secondary | ICD-10-CM

## 2023-04-26 DIAGNOSIS — F1090 Alcohol use, unspecified, uncomplicated: Secondary | ICD-10-CM

## 2023-04-26 DIAGNOSIS — N179 Acute kidney failure, unspecified: Secondary | ICD-10-CM | POA: Diagnosis not present

## 2023-04-26 DIAGNOSIS — K5730 Diverticulosis of large intestine without perforation or abscess without bleeding: Secondary | ICD-10-CM | POA: Insufficient documentation

## 2023-04-26 DIAGNOSIS — D500 Iron deficiency anemia secondary to blood loss (chronic): Secondary | ICD-10-CM | POA: Diagnosis not present

## 2023-04-26 DIAGNOSIS — N200 Calculus of kidney: Secondary | ICD-10-CM | POA: Diagnosis not present

## 2023-04-26 DIAGNOSIS — R0602 Shortness of breath: Secondary | ICD-10-CM | POA: Diagnosis not present

## 2023-04-26 DIAGNOSIS — N289 Disorder of kidney and ureter, unspecified: Secondary | ICD-10-CM | POA: Diagnosis not present

## 2023-04-26 LAB — CBC WITH DIFFERENTIAL/PLATELET
Basophils Absolute: 0.1 10*3/uL (ref 0.0–0.2)
Basos: 1 %
EOS (ABSOLUTE): 0.2 10*3/uL (ref 0.0–0.4)
Eos: 3 %
Hematocrit: 23.4 % — ABNORMAL LOW (ref 37.5–51.0)
Hemoglobin: 8 g/dL — ABNORMAL LOW (ref 13.0–17.7)
Immature Grans (Abs): 0 10*3/uL (ref 0.0–0.1)
Immature Granulocytes: 1 %
Lymphocytes Absolute: 1.2 10*3/uL (ref 0.7–3.1)
Lymphs: 21 %
MCH: 33.9 pg — ABNORMAL HIGH (ref 26.6–33.0)
MCHC: 34.2 g/dL (ref 31.5–35.7)
MCV: 99 fL — ABNORMAL HIGH (ref 79–97)
Monocytes Absolute: 0.8 10*3/uL (ref 0.1–0.9)
Monocytes: 13 %
Neutrophils Absolute: 3.5 10*3/uL (ref 1.4–7.0)
Neutrophils: 61 %
Platelets: 178 10*3/uL (ref 150–450)
RBC: 2.36 x10E6/uL — CL (ref 4.14–5.80)
RDW: 15.8 % — ABNORMAL HIGH (ref 11.6–15.4)
WBC: 5.8 10*3/uL (ref 3.4–10.8)

## 2023-04-26 LAB — COMPREHENSIVE METABOLIC PANEL
ALT: 17 IU/L (ref 0–44)
ALT: 23 U/L (ref 0–44)
AST: 37 IU/L (ref 0–40)
AST: 60 U/L — ABNORMAL HIGH (ref 15–41)
Albumin: 3.9 g/dL (ref 3.8–4.9)
Albumin: 3.6 g/dL (ref 3.5–5.0)
Alkaline Phosphatase: 101 IU/L (ref 44–121)
Alkaline Phosphatase: 90 U/L (ref 38–126)
Anion gap: 11 (ref 5–15)
BUN/Creatinine Ratio: 43 — ABNORMAL HIGH (ref 9–20)
BUN: 99 mg/dL (ref 6–24)
BUN: 96 mg/dL — ABNORMAL HIGH (ref 6–20)
Bilirubin Total: <0.2 mg/dL (ref 0.0–1.2)
CO2: 16 mmol/L — ABNORMAL LOW (ref 20–29)
CO2: 20 mmol/L — ABNORMAL LOW (ref 22–32)
Calcium: 9.1 mg/dL (ref 8.7–10.2)
Calcium: 8.4 mg/dL — ABNORMAL LOW (ref 8.9–10.3)
Chloride: 104 mmol/L (ref 96–106)
Chloride: 104 mmol/L (ref 98–111)
Creatinine, Ser: 2.29 mg/dL — ABNORMAL HIGH (ref 0.76–1.27)
Creatinine, Ser: 1.69 mg/dL — ABNORMAL HIGH (ref 0.61–1.24)
GFR, Estimated: 48 mL/min — ABNORMAL LOW (ref >60)
Globulin, Total: 2.5 g/dL (ref 1.5–4.5)
Glucose, Bld: 115 mg/dL — ABNORMAL HIGH (ref 70–99)
Glucose: 97 mg/dL (ref 70–99)
Potassium: 5.4 mmol/L — ABNORMAL HIGH (ref 3.5–5.2)
Potassium: 5.3 mmol/L — ABNORMAL HIGH (ref 3.5–5.1)
Sodium: 138 mmol/L (ref 134–144)
Sodium: 135 mmol/L (ref 135–145)
Total Bilirubin: 0.5 mg/dL (ref 0.3–1.2)
Total Protein: 6.4 g/dL (ref 6.0–8.5)
Total Protein: 6.7 g/dL (ref 6.5–8.1)
eGFR: 33 mL/min/{1.73_m2} — ABNORMAL LOW (ref >59)

## 2023-04-26 LAB — TYPE AND SCREEN

## 2023-04-26 LAB — PROTIME-INR
INR: 2.3 — ABNORMAL HIGH (ref 0.8–1.2)
Prothrombin Time: 25.1 seconds — ABNORMAL HIGH (ref 11.4–15.2)

## 2023-04-26 LAB — CBG MONITORING, ED: Glucose-Capillary: 111 mg/dL — ABNORMAL HIGH (ref 70–99)

## 2023-04-26 LAB — URINALYSIS, ROUTINE W REFLEX MICROSCOPIC
Bacteria, UA: NONE SEEN
Bilirubin Urine: NEGATIVE
Glucose, UA: 50 mg/dL — AB
Ketones, ur: NEGATIVE mg/dL
Leukocytes,Ua: NEGATIVE
Nitrite: NEGATIVE
Protein, ur: NEGATIVE mg/dL
Specific Gravity, Urine: 1.009 (ref 1.005–1.030)
Squamous Epithelial / HPF: NONE SEEN /HPF (ref 0–5)
pH: 5 (ref 5.0–8.0)

## 2023-04-26 LAB — CBC
HCT: 22.9 % — ABNORMAL LOW (ref 39.0–52.0)
Hemoglobin: 7.3 g/dL — ABNORMAL LOW (ref 13.0–17.0)
MCH: 32.9 pg (ref 26.0–34.0)
MCHC: 31.9 g/dL (ref 30.0–36.0)
MCV: 103.2 fL — ABNORMAL HIGH (ref 80.0–100.0)
Platelets: 173 10*3/uL (ref 150–400)
RBC: 2.22 MIL/uL — ABNORMAL LOW (ref 4.22–5.81)
RDW: 17.4 % — ABNORMAL HIGH (ref 11.5–15.5)
WBC: 5.5 10*3/uL (ref 4.0–10.5)
nRBC: 0 % (ref 0.0–0.2)

## 2023-04-26 LAB — LIPID PANEL
Chol/HDL Ratio: 1.7 ratio (ref 0.0–5.0)
Cholesterol, Total: 165 mg/dL (ref 100–199)
HDL: 97 mg/dL (ref >39)
LDL Chol Calc (NIH): 56 mg/dL (ref 0–99)
Triglycerides: 60 mg/dL (ref 0–149)
VLDL Cholesterol Cal: 12 mg/dL (ref 5–40)

## 2023-04-26 LAB — BRAIN NATRIURETIC PEPTIDE: B Natriuretic Peptide: 27.2 pg/mL (ref 0.0–100.0)

## 2023-04-26 LAB — CARBAMAZEPINE LEVEL, TOTAL: Carbamazepine (Tegretol), S: 14.6 ug/mL (ref 4.0–12.0)

## 2023-04-26 LAB — MAGNESIUM
Magnesium: 2.3 mg/dL (ref 1.6–2.3)
Magnesium: 2.1 mg/dL (ref 1.7–2.4)

## 2023-04-26 LAB — PREPARE RBC (CROSSMATCH)

## 2023-04-26 MED ORDER — ONDANSETRON HCL 4 MG PO TABS: 4.0000 mg | ORAL_TABLET | Freq: Four times a day (QID) | ORAL | Status: DC | PRN

## 2023-04-26 MED ORDER — INSULIN ASPART 100 UNIT/ML IV SOLN
5.0000 [IU] | Freq: Once | INTRAVENOUS | Status: AC
  Administered 2023-04-26: 5 [IU] via INTRAVENOUS
  Filled 2023-04-26: qty 0.05

## 2023-04-26 MED ORDER — PANTOPRAZOLE SODIUM 40 MG IV SOLR
40.0000 mg | Freq: Two times a day (BID) | INTRAVENOUS | Status: DC
  Administered 2023-04-26 – 2023-04-28 (×4): 40 mg via INTRAVENOUS
  Filled 2023-04-26 (×4): qty 10

## 2023-04-26 MED ORDER — THIAMINE HCL 100 MG/ML IJ SOLN: 100.0000 mg | Freq: Every day | INTRAMUSCULAR | Status: DC

## 2023-04-26 MED ORDER — ALBUTEROL SULFATE (2.5 MG/3ML) 0.083% IN NEBU: 3.0000 mL | INHALATION_SOLUTION | Freq: Four times a day (QID) | RESPIRATORY_TRACT | Status: DC | PRN

## 2023-04-26 MED ORDER — CARBAMAZEPINE 200 MG PO TABS
200.0000 mg | ORAL_TABLET | Freq: Two times a day (BID) | ORAL | Status: DC
  Administered 2023-04-26 – 2023-04-28 (×4): 200 mg via ORAL
  Filled 2023-04-26 (×4): qty 1

## 2023-04-26 MED ORDER — ADULT MULTIVITAMIN W/MINERALS CH
1.0000 | ORAL_TABLET | Freq: Every day | ORAL | Status: DC
  Administered 2023-04-26 – 2023-04-28 (×3): 1 via ORAL
  Filled 2023-04-26 (×3): qty 1

## 2023-04-26 MED ORDER — ONDANSETRON HCL 4 MG/2ML IJ SOLN: 4.0000 mg | Freq: Four times a day (QID) | INTRAMUSCULAR | Status: DC | PRN

## 2023-04-26 MED ORDER — UMECLIDINIUM BROMIDE 62.5 MCG/ACT IN AEPB
1.0000 | INHALATION_SPRAY | Freq: Every day | RESPIRATORY_TRACT | Status: DC
  Administered 2023-04-27 – 2023-04-28 (×2): 1 via RESPIRATORY_TRACT
  Filled 2023-04-26: qty 7

## 2023-04-26 MED ORDER — DILTIAZEM HCL ER COATED BEADS 120 MG PO CP24: 360.0000 mg | ORAL_CAPSULE | Freq: Every day | ORAL | Status: DC

## 2023-04-26 MED ORDER — DEXTROSE 50 % IV SOLN
1.0000 | Freq: Once | INTRAVENOUS | Status: AC
  Administered 2023-04-26: 50 mL via INTRAVENOUS
  Filled 2023-04-26: qty 50

## 2023-04-26 MED ORDER — SACUBITRIL-VALSARTAN 49-51 MG PO TABS: 1.0000 | ORAL_TABLET | Freq: Two times a day (BID) | ORAL | Status: DC

## 2023-04-26 MED ORDER — FOLIC ACID 1 MG PO TABS
1.0000 mg | ORAL_TABLET | Freq: Every day | ORAL | Status: DC
  Administered 2023-04-26 – 2023-04-28 (×3): 1 mg via ORAL
  Filled 2023-04-26 (×3): qty 1

## 2023-04-26 MED ORDER — FLUTICASONE FUROATE-VILANTEROL 100-25 MCG/ACT IN AEPB
1.0000 | INHALATION_SPRAY | Freq: Every day | RESPIRATORY_TRACT | Status: DC
  Administered 2023-04-27 – 2023-04-28 (×2): 1 via RESPIRATORY_TRACT
  Filled 2023-04-26: qty 28

## 2023-04-26 MED ORDER — PANTOPRAZOLE 80MG IVPB - SIMPLE MED
80.0000 mg | Freq: Once | INTRAVENOUS | Status: AC
  Administered 2023-04-26: 80 mg via INTRAVENOUS
  Filled 2023-04-26: qty 100

## 2023-04-26 MED ORDER — METOPROLOL TARTRATE 50 MG PO TABS: 75.0000 mg | ORAL_TABLET | Freq: Two times a day (BID) | ORAL | Status: DC

## 2023-04-26 MED ORDER — LORAZEPAM 1 MG PO TABS
1.0000 mg | ORAL_TABLET | ORAL | Status: DC | PRN
  Administered 2023-04-26: 1 mg via ORAL
  Filled 2023-04-26: qty 1

## 2023-04-26 MED ORDER — FUROSEMIDE 10 MG/ML IJ SOLN
40.0000 mg | Freq: Once | INTRAMUSCULAR | Status: AC
  Administered 2023-04-26: 40 mg via INTRAVENOUS
  Filled 2023-04-26: qty 4

## 2023-04-26 MED ORDER — THIAMINE MONONITRATE 100 MG PO TABS
100.0000 mg | ORAL_TABLET | Freq: Every day | ORAL | Status: DC
  Administered 2023-04-26 – 2023-04-28 (×3): 100 mg via ORAL
  Filled 2023-04-26 (×3): qty 1

## 2023-04-26 MED ORDER — ACETAMINOPHEN 325 MG PO TABS: 650.0000 mg | ORAL_TABLET | Freq: Four times a day (QID) | ORAL | Status: DC | PRN

## 2023-04-26 MED ORDER — SODIUM CHLORIDE 0.9 % IV SOLN
10.0000 mL/h | Freq: Once | INTRAVENOUS | Status: AC
  Administered 2023-04-26: 10 mL/h via INTRAVENOUS

## 2023-04-26 MED ORDER — ACETAMINOPHEN 650 MG RE SUPP: 650.0000 mg | Freq: Four times a day (QID) | RECTAL | Status: DC | PRN

## 2023-04-26 MED ORDER — ROSUVASTATIN CALCIUM 10 MG PO TABS
20.0000 mg | ORAL_TABLET | Freq: Every day | ORAL | Status: DC
  Administered 2023-04-27 – 2023-04-28 (×2): 20 mg via ORAL
  Filled 2023-04-26 (×2): qty 2

## 2023-04-26 NOTE — ED Provider Notes (Signed)
[facility] Provider Note    Event Date/Time   First MD Initiated Contact with Patient 04/26/23 1024     (approximate)   History   abnormal labs   HPI  Jose Marvel. is a 52 y.o. male   Past medical history of AF on coumadin, etoh use, CHF, alcohol use, COPD, hypertension, hyperlipidemia, here with lab abnormalities from PMD office for elevated creatinine, BUN, hyperkalemia.  He has felt some shortness of breath especially when laying flat, exertional, no cough or fever, no chest pain otherwise been feeling well.  Flank pain bilateral mild, straining to urination over the last several days.  No history of kidney stones.  Drinks about half pint of liquor per day.   Independent Historian contributed to assessment above: Mother is at bedside to corroborate information given above       Physical Exam   Triage Vital Signs: ED Triage Vitals  Enc Vitals Group     BP 04/26/23 1017 (!) 106/57     Pulse Rate 04/26/23 1017 (!) 102     Resp 04/26/23 1017 20     Temp 04/26/23 1017 98 F (36.7 C)     Temp src --      SpO2 04/26/23 1017 98 %     Weight 04/26/23 1018 201 lb (91.2 kg)     Height 04/26/23 1018 5\' 10"  (1.778 m)     Head Circumference --      Peak Flow --      Pain Score 04/26/23 1018 0     Pain Loc --      Pain Edu? --      Excl. in GC? --     Most recent vital signs: Vitals:   04/26/23 1145 04/26/23 1200  BP: (!) 107/58 (!) 110/59  Pulse: 81 87  Resp:  (!) 22  Temp:    SpO2:  100%    General: Awake, no distress.  CV:  Good peripheral perfusion.  Resp:  Normal effort.  Abd:  No distention.  Other:  Nontender abdomen.  Skin appears warm well-perfused.  Hemodynamics appropriate and reassuring.  Lungs clear   ED Results / Procedures / Treatments   Labs (all labs ordered are listed, but only abnormal results are displayed) Labs Reviewed  CBC - Abnormal; Notable for the following components:      Result Value   RBC 2.22  (*)    Hemoglobin 7.3 (*)    HCT 22.9 (*)    MCV 103.2 (*)    RDW 17.4 (*)    All other components within normal limits  COMPREHENSIVE METABOLIC PANEL - Abnormal; Notable for the following components:   Potassium 5.3 (*)    CO2 20 (*)    Glucose, Bld 115 (*)    BUN 96 (*)    Creatinine, Ser 1.69 (*)    Calcium 8.4 (*)    AST 60 (*)    GFR, Estimated 48 (*)    All other components within normal limits  URINALYSIS, ROUTINE W REFLEX MICROSCOPIC - Abnormal; Notable for the following components:   Color, Urine STRAW (*)    APPearance CLEAR (*)    Glucose, UA 50 (*)    Hgb urine dipstick SMALL (*)    All other components within normal limits  PROTIME-INR - Abnormal; Notable for the following components:   Prothrombin Time 25.1 (*)    INR 2.3 (*)    All other components within normal limits  MAGNESIUM  BRAIN NATRIURETIC PEPTIDE  PREPARE RBC (CROSSMATCH)     I ordered and reviewed the above labs they are notable for creatinine is elevated from prior, hyperkalemia, anemia  EKG  ED ECG REPORT I, Pilar Jarvis, the attending physician, personally viewed and interpreted this ECG.   Date: 04/26/2023  EKG Time: 1031  Rate: 88  Rhythm: sinus  Axis: nl  Intervals:pvcs  ST&T Change: no stemi    RADIOLOGY I independently reviewed and interpreted chest x-ray and see no obvious focality or pneumothorax.   PROCEDURES:  Critical Care performed: Yes, see critical care procedure note(s)  .Critical Care  Performed by: Pilar Jarvis, MD Authorized by: Pilar Jarvis, MD   Critical care provider statement:    Critical care time (minutes):  30   Critical care was time spent personally by me on the following activities:  Development of treatment plan with patient or surrogate, discussions with consultants, evaluation of patient's response to treatment, examination of patient, ordering and review of laboratory studies, ordering and review of radiographic studies, ordering and performing  treatments and interventions, pulse oximetry, re-evaluation of patient's condition and review of old charts    MEDICATIONS ORDERED IN ED: Medications  insulin aspart (novoLOG) injection 5 Units (has no administration in time range)    And  dextrose 50 % solution 50 mL (has no administration in time range)  LORazepam (ATIVAN) tablet 1-4 mg (1 mg Oral Given 04/26/23 1156)  thiamine (VITAMIN B1) tablet 100 mg (100 mg Oral Given 04/26/23 1155)    Or  thiamine (VITAMIN B1) injection 100 mg ( Intravenous See Alternative 04/26/23 1155)  folic acid (FOLVITE) tablet 1 mg (1 mg Oral Given 04/26/23 1155)  multivitamin with minerals tablet 1 tablet (1 tablet Oral Given 04/26/23 1155)  pantoprazole (PROTONIX) 80 mg /NS 100 mL IVPB (has no administration in time range)  0.9 %  sodium chloride infusion (has no administration in time range)  furosemide (LASIX) injection 40 mg (40 mg Intravenous Given 04/26/23 1155)    External physician / consultants:  I spoke with hospitalist for admit and  regarding care plan for this patient.   IMPRESSION / MDM / ASSESSMENT AND PLAN / ED COURSE  I reviewed the triage vital signs and the nursing notes.                                Patient's presentation is most consistent with acute presentation with potential threat to life or bodily function.  Differential diagnosis includes, but is not limited to, AKI, hyperkalemia, CHF exacerbation, anemia due to acute blood loss, GI bleeding   The patient is on the cardiac monitor to evaluate for evidence of arrhythmia and/or significant heart rate changes.  MDM:    Patient with AKI and hyperkalemia, no changes on EKG.  Hyperkalemia treatment ordered as well as Lasix for likely some mild CHF exacerbation with some mild shortness of breath and chest x-ray with some findings consistent with pulmonary edema.  He also has anemia w melena, on coumadin, ordered protonix and prbc 1 unit. Admit          FINAL CLINICAL  IMPRESSION(S) / ED DIAGNOSES   Final diagnoses:  AKI (acute kidney injury) (HCC)  Anemia, unspecified type  Hyperkalemia     Rx / DC Orders   ED Discharge Orders     None        Note:  This document was prepared using Dragon  voice recognition software and may include unintentional dictation errors.    Pilar Jarvis, MD 04/26/23 1325

## 2023-04-26 NOTE — Assessment & Plan Note (Addendum)
Patient is presenting with approximately 1 month of melena, with several day history of diffuse abdominal pain after alcohol use, worsening shortness of breath, and tremor with significantly elevated BUN, overall consistent with an upper GI bleed.  Likely triggered by daily alcohol use.  - GI consulted; appreciate their recommendations - N.p.o. - S/p Protonix 80 mg IV once in the ED - Continue Protonix 40 mg IV twice daily

## 2023-04-26 NOTE — Assessment & Plan Note (Addendum)
Blood pressure is on the lower end of normal today  - Hold home antihypertensives

## 2023-04-26 NOTE — Assessment & Plan Note (Signed)
Patient endorses worsening shortness of breath that I suspect is secondary to anemia.  No cough or increased sputum production.  No wheezing on examination.  -Continue home bronchodilators

## 2023-04-26 NOTE — Assessment & Plan Note (Signed)
Likely due to AKI.  Hyperkalemia is mild with no EKG changes.  He has received Lasix in addition to insulin/dextrose in the ED.  No further intervention at this time.  - Repeat BMP in the a.m.

## 2023-04-26 NOTE — Progress Notes (Signed)
Please call patient or his mom and let him know that his lab work shows that he is likely experiencing an acute kidney injury and his hemoglobin dropped significantly.  He needs to be seen in the ER as soon as possible.

## 2023-04-26 NOTE — Assessment & Plan Note (Addendum)
-   Holding home warfarin in the setting of GI bleed - Hold home metoprolol and diltiazem in light of low BP

## 2023-04-26 NOTE — Assessment & Plan Note (Signed)
Suspected to be secondary to GI bleed.  Hemoglobin decreased from 8.0 to 7.3 over the last 24 hours.  Previous to this, hemoglobin was 11 to 12.   - 1 unit of packed RBCs ordered in the ED - Posttransfusion CBC - Transfuse for hemoglobin less than 7

## 2023-04-26 NOTE — Consult Note (Signed)
GI Inpatient Consult Note  Reason for Consult: GI bleed   Attending Requesting Consult: Dr. Huel Cote  History of Present Illness: Jose [name]. is a 52 y.o. male seen for evaluation of GI bleeding at the request of Dr. Huel Cote. Past medical history significant of HFpEF, persistent atrial fibrillation on warfarin, alcohol use disorder on Tegretol complicated by alcohol withdrawal seizure, COPD, hypertension, thrombocytopenia, hyperlipidemia.  He presented to the ED after having abnormal labs at PCP yesterday. Noted to have significant drop in hgb to 8.0 from 11.4 three months ago. Also had BUN 99. Cr 2.29 & GFR 33. Hgb on admission was 7.3, Cr had improved to 1.69 with elevated BUN. CT negative for etiology. 1 unit PRBC has been ordered.  He reports feeling poorly over the past couple weeks, he notes having dark stools for about a month & reports that he takes iron and felt that it was due to the iron supplement.  For the past 2 weeks he has had some diffuse abdominal pain and weakness.  He presented to his physician yesterday for overall not feeling well and he was concerned his COPD was causing worsening weakness and shortness of breath.  He reports that he does not use NSAIDs.  He has 1 cigar/day.  He does usually drink half pint to a full pint of alcohol per day.  He last had alcohol yesterday evening.  Last Colonoscopy: 2019--3 sessile polyps, 6-9 mm size, moderate diverticulosis.Path w/  TAs Last Endoscopy: 2019--modular mucosa gastric fundus. Normal esophagus. Path w/ mild chronic gastritis   Past Medical History:  Past Medical History:  Diagnosis Date   Alcohol abuse    Cervicalgia    CHF (congestive heart failure) (HCC)    COPD (chronic obstructive pulmonary disease) (HCC)    Hyperlipidemia    Hypertension    Seizures (HCC)     Problem List: Patient Active Problem List   Diagnosis Date Noted   Upper GI bleed 04/26/2023   Blood loss anemia 04/26/2023   AKI (acute  kidney injury) (HCC) 04/26/2023   Hyperkalemia 04/26/2023   Alcohol use disorder 04/26/2023   Long term (current) use of anticoagulants 12/28/2022   Persistent atrial fibrillation (HCC) 12/19/2022   Seizure disorder (HCC) 11/11/2022   Acute metabolic encephalopathy 11/11/2022   Thrombocytopenia (HCC) 11/11/2022   Elevated LFTs 11/11/2022   Atrial fibrillation with RVR (HCC) 11/11/2022   Right knee pain 11/11/2022   Hypomagnesemia 11/11/2022   Prediabetes 08/24/2022   Acute CHF (congestive heart failure) (HCC) 12/21/2021   Alcoholic intoxication without complication (HCC) 02/22/2021   Chronic diastolic CHF (congestive heart failure) (HCC) 09/08/2020   Iron deficiency anemia due to chronic blood loss    Family history of colon cancer 06/28/2016   Seizures (HCC) 04/30/2015   Hypertension 04/30/2015   Alcohol abuse 04/30/2015   Epilepsy (HCC) 04/30/2015   Tobacco dependence 04/30/2015   COPD (chronic obstructive pulmonary disease) (HCC) 04/30/2015   Lumbago 04/30/2015   Hypercholesterolemia 04/30/2015   Chronic pain 04/30/2015   Neck pain 04/22/2013    Past Surgical History: Past Surgical History:  Procedure Laterality Date   CARDIOVERSION N/A 01/12/2023   Procedure: CARDIOVERSION;  Surgeon: Laurey Morale, MD;  Location: [facility] ORS;  Service: Cardiovascular;  Laterality: N/A;   CHOLECYSTECTOMY     COLONOSCOPY WITH PROPOFOL N/A 01/16/2018   Procedure: COLONOSCOPY WITH PROPOFOL;  Surgeon: Toney Reil, MD;  Location: [facility] ENDOSCOPY;  Service: Gastroenterology;  Laterality: N/A;   ESOPHAGOGASTRODUODENOSCOPY (EGD) WITH PROPOFOL N/A 01/16/2018  Procedure: ESOPHAGOGASTRODUODENOSCOPY (EGD) WITH PROPOFOL;  Surgeon: Toney Reil, MD;  Location: [facility] ENDOSCOPY;  Service: Gastroenterology;  Laterality: N/A;   TEE WITHOUT CARDIOVERSION N/A 01/12/2023   Procedure: TRANSESOPHAGEAL ECHOCARDIOGRAM (TEE);  Surgeon: Laurey Morale, MD;  Location: [facility] ORS;  Service: Cardiovascular;   Laterality: N/A;   TONSILLECTOMY      Allergies: No Known Allergies  Home Medications: (Not in a hospital admission)  Home medication reconciliation was completed with the patient.   Scheduled Inpatient Medications:    folic acid  1 mg Oral Daily   multivitamin with minerals  1 tablet Oral Daily   pantoprazole (PROTONIX) IV  40 mg Intravenous Q12H   thiamine  100 mg Oral Daily   Or   thiamine  100 mg Intravenous Daily    Continuous Inpatient Infusions:    PRN Inpatient Medications:  acetaminophen **OR** acetaminophen, LORazepam, ondansetron **OR** ondansetron (ZOFRAN) IV  Family History: family history includes Colon cancer in his mother; Diabetes in his mother; Hypertension in his father and mother; Multiple sclerosis in his father.    Social History:   reports that he has been smoking cigars. He started smoking about 4 years ago. He has quit using smokeless tobacco. He reports current alcohol use of about 28.0 standard drinks of alcohol per week. He reports that he does not use drugs.   Review of Systems: Constitutional: Weight is stable.  Eyes: No changes in vision. ENT: No oral lesions, sore throat.  GI: see HPI.  Heme/Lymph: No easy bruising.  CV: No chest pain.  GU: No hematuria.  Integumentary: No rashes.  Neuro: No headaches.  Psych: No depression/anxiety.  Endocrine: No heat/cold intolerance.  Allergic/Immunologic: No urticaria.  Resp: No cough, SOB.  Musculoskeletal: No joint swelling.    Physical Examination: BP 112/69   Pulse 85   Temp 98 F (36.7 C)   Resp 19   Ht 5\' 10"  (1.778 m)   Wt 91.2 kg   SpO2 100%   BMI 28.84 kg/m  Gen: NAD, alert and oriented x 4 HEENT: PEERLA, EOMI, Neck: supple, no JVD or thyromegaly Chest: CTA bilaterally, no wheezes, crackles, or other adventitious sounds CV: RRR, no m/g/c/r Abd: soft, NT, ND, +BS in all four quadrants; no HSM, guarding, ridigity, or rebound tenderness Ext: no edema, well perfused with 2+  pulses, Skin: no rash or lesions noted Lymph: no LAD  Data: Lab Results  Component Value Date   WBC 5.5 04/26/2023   HGB 7.3 (L) 04/26/2023   HCT 22.9 (L) 04/26/2023   MCV 103.2 (H) 04/26/2023   PLT 173 04/26/2023   Recent Labs  Lab 04/25/23 1045 04/26/23 1132  HGB 8.0* 7.3*  MCV 103  Lab Results  Component Value Date   NA 135 04/26/2023   K 5.3 (H) 04/26/2023   CL 104 04/26/2023   CO2 20 (L) 04/26/2023   BUN 96 (H) 04/26/2023   CREATININE 1.69 (H) 04/26/2023   Lab Results  Component Value Date   ALT 23 04/26/2023   AST 60 (H) 04/26/2023   ALKPHOS 90 04/26/2023   BILITOT 0.5 04/26/2023   Recent Labs  Lab 04/26/23 1132  INR 2.3*      Assessment/Plan: Jose [name] is a 52 y.o. male Admitted for symptomatic anemia/GI bleed.  Melena/symptomatic anemia-suspect upper GI etiology.  BUN/creatinine ratio elevated.  He has significant daily alcohol use.  He has been started on Protonix.  Needs EGD for further evaluation, will plan for tomorrow pending stability. He took Coumadin  either yesterday evening or this morning but has been discontinued since admission. Alcohol abuse-has history of alcohol withdrawal seizure and he is on Tegretol.  Will need to monitor closely.Right upper quadrant ultrasound January 2024 showed steatosis.  Has history of chronic thrombocytopenia. Will need outpatient f/up and alcohol abstinence.  AKI Atrial fib   Recommendations: EGD tomorrow Monitor H/h - transfuse per primary team Supportive care per primary team  Case was discussed with Dr. Norma Fredrickson.    Thank you for the consult. Please call with questions or concerns.  Ardelia Mems, PA-C [facility]

## 2023-04-26 NOTE — H&P (Addendum)
History and Physical    Patient: Jose [name]. ZOX:[id number] DOB: 09-15-1971 DOA: 04/26/2023 DOS: the patient was seen and examined on 04/26/2023 PCP: [name], NP  Patient coming from: Home  Chief Complaint:  Chief Complaint  Patient presents with   abnormal labs   HPI: Jose [name]. is a 52 y.o. male with medical history significant of HFpEF, persistent atrial fibrillation on warfarin, alcohol use disorder on Tegretol complicated by alcohol withdrawal seizure, COPD, hypertension, thrombocytopenia, hyperlipidemia, who presents to the ED due to abnormal labs obtained at PCPs office.  Jose [name] states that he has been experiencing melena for approximately 1 month ago.  He states that all his bowel movements are black in color but he thought this may be due to his medication.  He notes that he is on iron supplementation but has been on iron for much longer.  In the last 3-4 days, he is also noted new onset severe diffuse abdominal pain that occurs specifically after drinking alcohol.  He continues to drink half a pint of liquor per day.  In the last 1 week, he has noticed that his tremors seem worse and he has been experiencing worsening shortness of breath compared to baseline.  He notes chronic chest pain with exertion but states that this is generally unchanged.  He has been taking all his medications as prescribed as his mother helps him with this.  Per chart review, patient was seen at his PCPs office for follow-up at which time CMP was ordered that demonstrated BUN of 99 with creatinine of 2.29 and GFR of 33.  Potassium elevated at 5.4.  Bicarb was low at 16.CBC was obtained that demonstrated hemoglobin of 8.0 compared to prior 11.4.  ED course: On arrival to the ED, patient's blood pressure was on the lower end of normal at 106/57 with heart rate of 93.  He was saturating at 98% on room air.  He was afebrile and 98.  Initial workup demonstrated hemoglobin of 7.3, platelets of 173,  bicarb of 20, potassium 5.3, glucose 115, BUN 96, creatinine 1.69, AST of 60 and GFR 48.  Urinalysis demonstrated small glucosuria and hematuria.  INR at goal at 2.3. CT renal stone study was obtained that demonstrated no evidence of nephrolithiasis but notable for a small subcapsular lesion in the right kidney.  1 unit of packed RBCs ordered.  Patient started on insulin and dextrose for hyperkalemia.  [facility] contacted for admission.  Review of Systems: As mentioned in the history of present illness. All other systems reviewed and are negative.  Past Medical History:  Diagnosis Date   Alcohol abuse    Cervicalgia    CHF (congestive heart failure) (HCC)    COPD (chronic obstructive pulmonary disease) (HCC)    Hyperlipidemia    Hypertension    Seizures (HCC)    Past Surgical History:  Procedure Laterality Date   CARDIOVERSION N/A 01/12/2023   Procedure: CARDIOVERSION;  Surgeon: Laurey Morale, MD;  Location: [facility] ORS;  Service: Cardiovascular;  Laterality: N/A;   CHOLECYSTECTOMY     COLONOSCOPY WITH PROPOFOL N/A 01/16/2018   Procedure: COLONOSCOPY WITH PROPOFOL;  Surgeon: Toney Reil, MD;  Location: [facility] ENDOSCOPY;  Service: Gastroenterology;  Laterality: N/A;   ESOPHAGOGASTRODUODENOSCOPY (EGD) WITH PROPOFOL N/A 01/16/2018   Procedure: ESOPHAGOGASTRODUODENOSCOPY (EGD) WITH PROPOFOL;  Surgeon: Toney Reil, MD;  Location: [facility] ENDOSCOPY;  Service: Gastroenterology;  Laterality: N/A;   TEE WITHOUT CARDIOVERSION N/A 01/12/2023   Procedure: TRANSESOPHAGEAL ECHOCARDIOGRAM (TEE);  Surgeon: Laurey Morale, MD;  Location: [facility] ORS;  Service: Cardiovascular;  Laterality: N/A;   TONSILLECTOMY     Social History:  reports that he has been smoking cigars. He started smoking about 4 years ago. He has quit using smokeless tobacco. He reports current alcohol use of about 28.0 standard drinks of alcohol per week. He reports that he does not use drugs.  No Known Allergies  Family History   Problem Relation Age of Onset   Hypertension Mother    Diabetes Mother    Colon cancer Mother    Hypertension Father    Multiple sclerosis Father     Prior to Admission medications   Medication Sig Start Date End Date Taking? Authorizing Provider  albuterol (VENTOLIN HFA) 108 (90 Base) MCG/ACT inhaler Inhale 2 puffs into the lungs every 6 (six) hours as needed for wheezing or shortness of breath. 06/27/22   Pilar Jarvis, MD  carbamazepine (TEGRETOL) 200 MG tablet Take 1 tablet (200 mg total) by mouth 2 (two) times daily. 04/25/23   [name], NP  diltiazem (CARDIZEM CD) 360 MG 24 hr capsule Take 1 capsule (360 mg total) by mouth daily. 12/19/22   Laurey Morale, MD  empagliflozin (JARDIANCE) 10 MG TABS tablet Take 1 tablet (10 mg total) by mouth daily before breakfast. 04/25/23   [name], NP  ferrous sulfate (SV IRON) 325 (65 FE) MG tablet Take 1 tablet by mouth once daily 04/11/23   [name], NP  Fluticasone-Umeclidin-Vilant (TRELEGY ELLIPTA) 100-62.5-25 MCG/ACT AEPB Inhale 1 puff into the lungs daily. 11/24/22   [name], NP  furosemide (LASIX) 40 MG tablet Take 1 tablet (40 mg total) by mouth every morning. 12/30/22   Delma Freeze, FNP  Magnesium Oxide -Mg Supplement (MAG-OXIDE) 200 MG TABS Take 1 tablet (200 mg total) by mouth 2 (two) times daily. 04/25/23   [name], NP  metoprolol tartrate (LOPRESSOR) 25 MG tablet TAKE 3 TABLETS BY MOUTH TWICE DAILY 04/24/23   Laurey Morale, MD  potassium chloride (KLOR-CON) 10 MEQ tablet Take 20 mEq by mouth daily.    [provider]  rosuvastatin (CRESTOR) 20 MG tablet Take 1 tablet (20 mg total) by mouth daily. 12/19/22   Laurey Morale, MD  sacubitril-valsartan (ENTRESTO) 49-51 MG Take 1 tablet by mouth 2 (two) times daily. 04/25/23   [name], NP  spironolactone (ALDACTONE) 25 MG tablet Take 1 tablet (25 mg total) by mouth daily. Patient taking differently: Take 12.5 mg by mouth daily.  03/16/23   Bensimhon, Bevelyn Buckles, MD  warfarin (COUMADIN) 5 MG tablet Take 1 tablet (5 mg total) by mouth as directed. 03/29/23   Bensimhon, Bevelyn Buckles, MD    Physical Exam: Vitals:   04/26/23 1600 04/26/23 1632 04/26/23 1658 04/26/23 1700  BP: 98/70 95/63 97/69    Pulse: 82 81 87   Resp: 17 16 18    Temp:   98.1 F (36.7 C)   TempSrc:   Oral   SpO2: 100% 97% 100%   Weight:    88.7 kg  Height:    5' 10.5" (1.791 m)   Physical Exam Vitals and nursing note reviewed.  Constitutional:      General: He is not in acute distress.    Appearance: He is normal weight. He is not toxic-appearing.  HENT:     Head: Normocephalic and atraumatic.     Mouth/Throat:     Mouth: Mucous membranes are moist.     Pharynx: Oropharynx is clear.  Eyes:     Conjunctiva/sclera: Conjunctivae normal.     Pupils: Pupils are equal, round, and reactive to light.  Cardiovascular:     Rate and Rhythm: Normal rate. Rhythm irregularly irregular.     Heart sounds: No murmur heard. Pulmonary:     Effort: Pulmonary effort is normal. No respiratory distress.     Breath sounds: Normal breath sounds. No wheezing, rhonchi or rales.  Abdominal:     General: Bowel sounds are normal.     Palpations: Abdomen is soft.     Tenderness: There is abdominal tenderness (Diffuse). There is no guarding.  Musculoskeletal:     Right lower leg: No edema.     Left lower leg: No edema.  Skin:    General: Skin is warm and dry.  Neurological:     Mental Status: He is alert and oriented to person, place, and time.     Motor: Tremor present. No weakness.  Psychiatric:        Mood and Affect: Mood normal.        Behavior: Behavior normal.    Data Reviewed: CBC with WBC of 5.5, hemoglobin 7.3, MCV 103, platelets 173 CMP with sodium of 135, potassium 5.3, bicarb 20, glucose 115, BUN 96, creatinine 1.69, AST 60, ALT 23 and GFR 48 INR 2.3 Urinalysis with small hematuria and glucosuria  EKG personally reviewed.  Appears to be sinus  rhythm with prolonged first-degree AV block and infrequent PVC.  No ischemic changes noted  DG Chest Port 1 View  Result Date: 04/26/2023 CLINICAL DATA:  Shortness of breath. EXAM: PORTABLE CHEST 1 VIEW COMPARISON:  11/11/2022 FINDINGS: Borderline cardiopericardial silhouette. Mild central vascular congestion and peribronchial thickening. No consolidation, pneumothorax or effusion. Overlapping cardiac leads. Calcified aorta. IMPRESSION: Enlarged cardiopericardial silhouette with some vascular congestion and peribronchial thickening. Electronically Signed   By: Karen Kays M.D.   On: 04/26/2023 11:33   CT Renal Stone Study  Result Date: 04/26/2023 CLINICAL DATA:  Elevated BUN and creatinine. Question kidney stone. Flank pain. EXAM: CT ABDOMEN AND PELVIS WITHOUT CONTRAST TECHNIQUE: Multidetector CT imaging of the abdomen and pelvis was performed following the standard protocol without IV contrast. RADIATION DOSE REDUCTION: This exam was performed according to the departmental dose-optimization program which includes automated exposure control, adjustment of the mA and/or kV according to patient size and/or use of iterative reconstruction technique. COMPARISON:  None Available. FINDINGS: Lower chest: Unremarkable. Hepatobiliary: No suspicious focal abnormality in the liver on this study without intravenous contrast. Gallbladder decompressed. No intrahepatic or extrahepatic biliary dilation. Pancreas: No focal mass lesion. No dilatation of the main duct. No intraparenchymal cyst. No peripancreatic edema. Spleen: No splenomegaly. No suspicious focal mass lesion. Adrenals/Urinary Tract: No adrenal nodule or mass. 9 mm subcapsular lesion interpolar right kidney has attenuation too high to be a simple cyst. 2 mm nonobstructing stone identified interpolar left kidney no ureteral or bladder stones. No hydroureteronephrosis. Stomach/Bowel: Stomach is unremarkable. No gastric wall thickening. No evidence of outlet  obstruction. Duodenum is normally positioned as is the ligament of Treitz. No small bowel wall thickening. No small bowel dilatation. The terminal ileum is normal. The appendix is not well visualized, but there is no edema or inflammation in the region of the cecum. No gross colonic mass. No colonic wall thickening. Vascular/Lymphatic: There is moderate atherosclerotic calcification of the abdominal aorta without aneurysm. There is no gastrohepatic or hepatoduodenal ligament lymphadenopathy. No retroperitoneal or mesenteric lymphadenopathy. No pelvic sidewall lymphadenopathy. Reproductive: The prostate gland and seminal  vesicles are unremarkable. Other: No intraperitoneal free fluid. Musculoskeletal: No worrisome lytic or sclerotic osseous abnormality. IMPRESSION: 1. 2 mm nonobstructing stone interpolar left kidney. No ureteral or bladder stones. No hydroureteronephrosis. 2. 9 mm subcapsular lesion interpolar right kidney has attenuation too high to be a simple cyst. While this is most likely a cyst complicated by proteinaceous debris or hemorrhage, nonemergent outpatient MRI of the abdomen with and without contrast recommended to further evaluate. 3.  Aortic Atherosclerosis (ICD10-I70.0). Electronically Signed   By: Kennith Center M.D.   On: 04/26/2023 11:22    Results are pending, will review when available.  Assessment and Plan:  * Upper GI bleed Patient is presenting with approximately 1 month of melena, with several day history of diffuse abdominal pain after alcohol use, worsening shortness of breath, and tremor with significantly elevated BUN, overall consistent with an upper GI bleed.  Likely triggered by daily alcohol use.  - GI consulted; appreciate their recommendations - N.p.o. - S/p Protonix 80 mg IV once in the ED - Continue Protonix 40 mg IV twice daily  Blood loss anemia Suspected to be secondary to GI bleed.  Hemoglobin decreased from 8.0 to 7.3 over the last 24 hours.  Previous to  this, hemoglobin was 11 to 12.   - 1 unit of packed RBCs ordered in the ED - Posttransfusion CBC - Transfuse for hemoglobin less than 7  AKI (acute kidney injury) (HCC) Per chart review, patient's renal function tends to fluctuate but seems to be between 0.7 and 1.1 at baseline.  Yesterday when checked at PCPs office it was elevated 2.29 and has now improved to 1.69.  BUN is markedly elevated compared to creatinine, which I suspect is secondary to GI bleed.  Overall, AKI is likely due to fluid loss with GI bleed.  No obstructive evidence on CT.  - Avoid nephrotoxic agents - 1 unit of packed RBCs ordered - Hold off on any further diuretics today - Repeat BMP in the a.m.  Hyperkalemia Likely due to AKI.  Hyperkalemia is mild with no EKG changes.  He has received Lasix in addition to insulin/dextrose in the ED.  No further intervention at this time.  - Repeat BMP in the a.m.  Alcohol use disorder Patient has a history of alcohol use disorder complicated by seizures during withdrawal.  - CIWA with as needed Ativan - Daily thiamine, folic acid and multivitamin - Continue home Tegretol  Persistent atrial fibrillation (HCC) - Holding home warfarin in the setting of GI bleed - Hold home metoprolol and diltiazem in light of low BP  Seizure disorder (HCC) In the setting of alcohol withdrawal.  -CIWA monitoring with Ativan as needed  Chronic diastolic CHF (congestive heart failure) (HCC) Patient appears euvolemic at this time with no evidence of peripheral or pulmonary edema.  Will hold home diuretics for today in the setting of AKI.  -Restart home GDMT and diuretics tomorrow if improvement in AKI  COPD (chronic obstructive pulmonary disease) (HCC) Patient endorses worsening shortness of breath that I suspect is secondary to anemia.  No cough or increased sputum production.  No wheezing on examination.  -Continue home bronchodilators  Hypertension Blood pressure is on the lower end  of normal today  - Hold home antihypertensives   Advance Care Planning:   Code Status: Full Code   Consults: GI  Family Communication: Patient's mother updated at bedside  Severity of Illness: The appropriate patient status for this patient is OBSERVATION. Observation status is judged to  be reasonable and necessary in order to provide the required intensity of service to ensure the patient's safety. The patient's presenting symptoms, physical exam findings, and initial radiographic and laboratory data in the context of their medical condition is felt to place them at decreased risk for further clinical deterioration. Furthermore, it is anticipated that the patient will be medically stable for discharge from the hospital within 2 midnights of admission.   Author: Verdene Lennert, MD 04/26/2023 6:32 PM  For on call review [email or web address].

## 2023-04-26 NOTE — Progress Notes (Addendum)
Blood infusion verified with 2nd RN, pt education provided, Pt information verified. Infusion started per policy. RN stayed with Pt for 20 min after start time. No adverse reactions noted at this time.   23:05 Transfusion complete. Post transfusion vitals taken. Pt tolerated infusion without adverse reactions.

## 2023-04-26 NOTE — Assessment & Plan Note (Addendum)
Patient appears euvolemic at this time with no evidence of peripheral or pulmonary edema.  Will hold home diuretics for today in the setting of AKI.  -Restart home GDMT and diuretics tomorrow if improvement in AKI

## 2023-04-26 NOTE — Assessment & Plan Note (Signed)
Patient has a history of alcohol use disorder complicated by seizures during withdrawal.  - CIWA with as needed Ativan - Daily thiamine, folic acid and multivitamin - Continue home Tegretol

## 2023-04-26 NOTE — ED Triage Notes (Signed)
Pt to ED for abnormal labs at doctors office yeterday. Elevated, BUN, creatinine, and K+.

## 2023-04-26 NOTE — H&P (View-Only) (Signed)
 GI Inpatient Consult Note  Reason for Consult: GI bleed   Attending Requesting Consult: Dr. Basaraba  History of Present Illness: Jose M Niesen Jr. is a 52 y.o. male seen for evaluation of GI bleeding at the request of Dr. Basaraba. Past medical history significant of HFpEF, persistent atrial fibrillation on warfarin, alcohol use disorder on Tegretol complicated by alcohol withdrawal seizure, COPD, hypertension, thrombocytopenia, hyperlipidemia.  He presented to the ED after having abnormal labs at PCP yesterday. Noted to have significant drop in hgb to 8.0 from 11.4 three months ago. Also had BUN 99. Cr 2.29 & GFR 33. Hgb on admission was 7.3, Cr had improved to 1.69 with elevated BUN. CT negative for etiology. 1 unit PRBC has been ordered.  He reports feeling poorly over the past couple weeks, he notes having dark stools for about a month & reports that he takes iron and felt that it was due to the iron supplement.  For the past 2 weeks he has had some diffuse abdominal pain and weakness.  He presented to his physician yesterday for overall not feeling well and he was concerned his COPD was causing worsening weakness and shortness of breath.  He reports that he does not use NSAIDs.  He has 1 cigar/day.  He does usually drink half pint to a full pint of alcohol per day.  He last had alcohol yesterday evening.  Last Colonoscopy: 2019--3 sessile polyps, 6-9 mm size, moderate diverticulosis.Path w/  TAs Last Endoscopy: 2019--modular mucosa gastric fundus. Normal esophagus. Path w/ mild chronic gastritis   Past Medical History:  Past Medical History:  Diagnosis Date   Alcohol abuse    Cervicalgia    CHF (congestive heart failure) (HCC)    COPD (chronic obstructive pulmonary disease) (HCC)    Hyperlipidemia    Hypertension    Seizures (HCC)     Problem List: Patient Active Problem List   Diagnosis Date Noted   Upper GI bleed 04/26/2023   Blood loss anemia 04/26/2023   AKI (acute  kidney injury) (HCC) 04/26/2023   Hyperkalemia 04/26/2023   Alcohol use disorder 04/26/2023   Long term (current) use of anticoagulants 12/28/2022   Persistent atrial fibrillation (HCC) 12/19/2022   Seizure disorder (HCC) 11/11/2022   Acute metabolic encephalopathy 11/11/2022   Thrombocytopenia (HCC) 11/11/2022   Elevated LFTs 11/11/2022   Atrial fibrillation with RVR (HCC) 11/11/2022   Right knee pain 11/11/2022   Hypomagnesemia 11/11/2022   Prediabetes 08/24/2022   Acute CHF (congestive heart failure) (HCC) 12/21/2021   Alcoholic intoxication without complication (HCC) 02/22/2021   Chronic diastolic CHF (congestive heart failure) (HCC) 09/08/2020   Iron deficiency anemia due to chronic blood loss    Family history of colon cancer 06/28/2016   Seizures (HCC) 04/30/2015   Hypertension 04/30/2015   Alcohol abuse 04/30/2015   Epilepsy (HCC) 04/30/2015   Tobacco dependence 04/30/2015   COPD (chronic obstructive pulmonary disease) (HCC) 04/30/2015   Lumbago 04/30/2015   Hypercholesterolemia 04/30/2015   Chronic pain 04/30/2015   Neck pain 04/22/2013    Past Surgical History: Past Surgical History:  Procedure Laterality Date   CARDIOVERSION N/A 01/12/2023   Procedure: CARDIOVERSION;  Surgeon: McLean, Dalton S, MD;  Location: [facility] ORS;  Service: Cardiovascular;  Laterality: N/A;   CHOLECYSTECTOMY     COLONOSCOPY WITH PROPOFOL N/A 01/16/2018   Procedure: COLONOSCOPY WITH PROPOFOL;  Surgeon: Vanga, Rohini Reddy, MD;  Location: [facility] ENDOSCOPY;  Service: Gastroenterology;  Laterality: N/A;   ESOPHAGOGASTRODUODENOSCOPY (EGD) WITH PROPOFOL N/A 01/16/2018     Procedure: ESOPHAGOGASTRODUODENOSCOPY (EGD) WITH PROPOFOL;  Surgeon: Vanga, Rohini Reddy, MD;  Location: [facility] ENDOSCOPY;  Service: Gastroenterology;  Laterality: N/A;   TEE WITHOUT CARDIOVERSION N/A 01/12/2023   Procedure: TRANSESOPHAGEAL ECHOCARDIOGRAM (TEE);  Surgeon: McLean, Dalton S, MD;  Location: [facility] ORS;  Service: Cardiovascular;   Laterality: N/A;   TONSILLECTOMY      Allergies: No Known Allergies  Home Medications: (Not in a hospital admission)  Home medication reconciliation was completed with the patient.   Scheduled Inpatient Medications:    folic acid  1 mg Oral Daily   multivitamin with minerals  1 tablet Oral Daily   pantoprazole (PROTONIX) IV  40 mg Intravenous Q12H   thiamine  100 mg Oral Daily   Or   thiamine  100 mg Intravenous Daily    Continuous Inpatient Infusions:    PRN Inpatient Medications:  acetaminophen **OR** acetaminophen, LORazepam, ondansetron **OR** ondansetron (ZOFRAN) IV  Family History: family history includes Colon cancer in his mother; Diabetes in his mother; Hypertension in his father and mother; Multiple sclerosis in his father.    Social History:   reports that he has been smoking cigars. He started smoking about 4 years ago. He has quit using smokeless tobacco. He reports current alcohol use of about 28.0 standard drinks of alcohol per week. He reports that he does not use drugs.   Review of Systems: Constitutional: Weight is stable.  Eyes: No changes in vision. ENT: No oral lesions, sore throat.  GI: see HPI.  Heme/Lymph: No easy bruising.  CV: No chest pain.  GU: No hematuria.  Integumentary: No rashes.  Neuro: No headaches.  Psych: No depression/anxiety.  Endocrine: No heat/cold intolerance.  Allergic/Immunologic: No urticaria.  Resp: No cough, SOB.  Musculoskeletal: No joint swelling.    Physical Examination: BP 112/69   Pulse 85   Temp 98 F (36.7 C)   Resp 19   Ht 5' 10" (1.778 m)   Wt 91.2 kg   SpO2 100%   BMI 28.84 kg/m  Gen: NAD, alert and oriented x 4 HEENT: PEERLA, EOMI, Neck: supple, no JVD or thyromegaly Chest: CTA bilaterally, no wheezes, crackles, or other adventitious sounds CV: RRR, no m/g/c/r Abd: soft, NT, ND, +BS in all four quadrants; no HSM, guarding, ridigity, or rebound tenderness Ext: no edema, well perfused with 2+  pulses, Skin: no rash or lesions noted Lymph: no LAD  Data: Lab Results  Component Value Date   WBC 5.5 04/26/2023   HGB 7.3 (L) 04/26/2023   HCT 22.9 (L) 04/26/2023   MCV 103.2 (H) 04/26/2023   PLT 173 04/26/2023   Recent Labs  Lab 04/25/23 1045 04/26/23 1132  HGB 8.0* 7.3*  MCV 103  Lab Results  Component Value Date   NA 135 04/26/2023   K 5.3 (H) 04/26/2023   CL 104 04/26/2023   CO2 20 (L) 04/26/2023   BUN 96 (H) 04/26/2023   CREATININE 1.69 (H) 04/26/2023   Lab Results  Component Value Date   ALT 23 04/26/2023   AST 60 (H) 04/26/2023   ALKPHOS 90 04/26/2023   BILITOT 0.5 04/26/2023   Recent Labs  Lab 04/26/23 1132  INR 2.3*      Assessment/Plan: Jose [name] is a 52 y.o. male Admitted for symptomatic anemia/GI bleed.  Melena/symptomatic anemia-suspect upper GI etiology.  BUN/creatinine ratio elevated.  He has significant daily alcohol use.  He has been started on Protonix.  Needs EGD for further evaluation, will plan for tomorrow pending stability. He took Coumadin   either yesterday evening or this morning but has been discontinued since admission. Alcohol abuse-has history of alcohol withdrawal seizure and he is on Tegretol.  Will need to monitor closely.Right upper quadrant ultrasound January 2024 showed steatosis.  Has history of chronic thrombocytopenia. Will need outpatient f/up and alcohol abstinence.  AKI Atrial fib   Recommendations: EGD tomorrow Monitor H/h - transfuse per primary team Supportive care per primary team  Case was discussed with Dr. Toledo.    Thank you for the consult. Please call with questions or concerns.  Charle Mclaurin B Vang Kraeger, PA-C [facility]  

## 2023-04-26 NOTE — Assessment & Plan Note (Addendum)
Per chart review, patient's renal function tends to fluctuate but seems to be between 0.7 and 1.1 at baseline.  Yesterday when checked at PCPs office it was elevated 2.29 and has now improved to 1.69.  BUN is markedly elevated compared to creatinine, which I suspect is secondary to GI bleed.  Overall, AKI is likely due to fluid loss with GI bleed.  No obstructive evidence on CT.  - Avoid nephrotoxic agents - 1 unit of packed RBCs ordered - Hold off on any further diuretics today - Repeat BMP in the a.m.

## 2023-04-26 NOTE — Assessment & Plan Note (Signed)
In the setting of alcohol withdrawal.  -CIWA monitoring with Ativan as needed

## 2023-04-27 ENCOUNTER — Encounter
Admission: EM | Disposition: A | Discharge status: Home / Self Care | Payer: Self-pay | Source: Home / Self Care | Attending: Emergency Medicine

## 2023-04-27 ENCOUNTER — Observation Stay: Payer: BC Managed Care – PPO | Admitting: General Practice

## 2023-04-27 DIAGNOSIS — K2980 Duodenitis without bleeding: Secondary | ICD-10-CM | POA: Diagnosis not present

## 2023-04-27 DIAGNOSIS — D500 Iron deficiency anemia secondary to blood loss (chronic): Secondary | ICD-10-CM | POA: Diagnosis not present

## 2023-04-27 DIAGNOSIS — K922 Gastrointestinal hemorrhage, unspecified: Secondary | ICD-10-CM | POA: Diagnosis not present

## 2023-04-27 DIAGNOSIS — I4819 Other persistent atrial fibrillation: Secondary | ICD-10-CM | POA: Diagnosis not present

## 2023-04-27 DIAGNOSIS — Q2733 Arteriovenous malformation of digestive system vessel: Secondary | ICD-10-CM | POA: Diagnosis not present

## 2023-04-27 DIAGNOSIS — F10920 Alcohol use, unspecified with intoxication, uncomplicated: Secondary | ICD-10-CM | POA: Diagnosis not present

## 2023-04-27 DIAGNOSIS — K31819 Angiodysplasia of stomach and duodenum without bleeding: Secondary | ICD-10-CM | POA: Diagnosis not present

## 2023-04-27 DIAGNOSIS — I5032 Chronic diastolic (congestive) heart failure: Secondary | ICD-10-CM | POA: Diagnosis not present

## 2023-04-27 DIAGNOSIS — K3189 Other diseases of stomach and duodenum: Secondary | ICD-10-CM | POA: Diagnosis not present

## 2023-04-27 DIAGNOSIS — I509 Heart failure, unspecified: Secondary | ICD-10-CM | POA: Diagnosis not present

## 2023-04-27 HISTORY — PX: HOT HEMOSTASIS: SHX5433

## 2023-04-27 HISTORY — PX: ESOPHAGOGASTRODUODENOSCOPY (EGD) WITH PROPOFOL: SHX5813

## 2023-04-27 LAB — COMPREHENSIVE METABOLIC PANEL
ALT: 23 U/L (ref 0–44)
AST: 47 U/L — ABNORMAL HIGH (ref 15–41)
Albumin: 3.6 g/dL (ref 3.5–5.0)
Alkaline Phosphatase: 80 U/L (ref 38–126)
Anion gap: 11 (ref 5–15)
BUN: 75 mg/dL — ABNORMAL HIGH (ref 6–20)
CO2: 21 mmol/L — ABNORMAL LOW (ref 22–32)
Calcium: 8.7 mg/dL — ABNORMAL LOW (ref 8.9–10.3)
Chloride: 104 mmol/L (ref 98–111)
Creatinine, Ser: 0.9 mg/dL (ref 0.61–1.24)
GFR, Estimated: >60 mL/min (ref >60)
Glucose, Bld: 100 mg/dL — ABNORMAL HIGH (ref 70–99)
Potassium: 4.2 mmol/L (ref 3.5–5.1)
Sodium: 136 mmol/L (ref 135–145)
Total Bilirubin: 0.9 mg/dL (ref 0.3–1.2)
Total Protein: 6.7 g/dL (ref 6.5–8.1)

## 2023-04-27 LAB — TYPE AND SCREEN
ABO/RH(D): O POS
Antibody Screen: NEGATIVE
Unit division: 0

## 2023-04-27 LAB — CBC
HCT: 25.7 % — ABNORMAL LOW (ref 39.0–52.0)
Hemoglobin: 8.4 g/dL — ABNORMAL LOW (ref 13.0–17.0)
MCH: 32.8 pg (ref 26.0–34.0)
MCHC: 32.7 g/dL (ref 30.0–36.0)
MCV: 100.4 fL — ABNORMAL HIGH (ref 80.0–100.0)
Platelets: 147 10*3/uL — ABNORMAL LOW (ref 150–400)
RBC: 2.56 MIL/uL — ABNORMAL LOW (ref 4.22–5.81)
RDW: 16.8 % — ABNORMAL HIGH (ref 11.5–15.5)
WBC: 5.8 10*3/uL (ref 4.0–10.5)
nRBC: 0 % (ref 0.0–0.2)

## 2023-04-27 LAB — BPAM RBC
Blood Product Expiration Date: 202407262359
ISSUE DATE / TIME: 202406192009
Unit Type and Rh: 5100

## 2023-04-27 LAB — HIV ANTIBODY (ROUTINE TESTING W REFLEX): HIV Screen 4th Generation wRfx: NONREACTIVE

## 2023-04-27 LAB — PROTIME-INR
INR: 1.5 — ABNORMAL HIGH (ref 0.8–1.2)
Prothrombin Time: 18.2 seconds — ABNORMAL HIGH (ref 11.4–15.2)

## 2023-04-27 SURGERY — ESOPHAGOGASTRODUODENOSCOPY (EGD) WITH PROPOFOL
Anesthesia: General

## 2023-04-27 MED ORDER — PROPOFOL 500 MG/50ML IV EMUL
INTRAVENOUS | Status: DC | PRN
  Administered 2023-04-27: 170 ug/kg/min via INTRAVENOUS

## 2023-04-27 MED ORDER — SODIUM CHLORIDE 0.9 % IV SOLN: INTRAVENOUS | Status: DC

## 2023-04-27 MED ORDER — LIDOCAINE HCL (CARDIAC) PF 100 MG/5ML IV SOSY
PREFILLED_SYRINGE | INTRAVENOUS | Status: DC | PRN
  Administered 2023-04-27: 50 mg via INTRAVENOUS

## 2023-04-27 MED ORDER — GLYCOPYRROLATE 0.2 MG/ML IJ SOLN
INTRAMUSCULAR | Status: DC | PRN
  Administered 2023-04-27: .2 mg via INTRAVENOUS

## 2023-04-27 MED ORDER — PROPOFOL 10 MG/ML IV BOLUS
INTRAVENOUS | Status: DC | PRN
  Administered 2023-04-27: 40 mg via INTRAVENOUS
  Administered 2023-04-27: 70 mg via INTRAVENOUS

## 2023-04-27 MED ORDER — DEXMEDETOMIDINE HCL IN NACL 80 MCG/20ML IV SOLN
INTRAVENOUS | Status: DC | PRN
  Administered 2023-04-27: 8 ug via INTRAVENOUS

## 2023-04-27 MED ORDER — PHENYLEPHRINE 80 MCG/ML (10ML) SYRINGE FOR IV PUSH (FOR BLOOD PRESSURE SUPPORT)
PREFILLED_SYRINGE | INTRAVENOUS | Status: AC
  Filled 2023-04-27: qty 10

## 2023-04-27 MED ORDER — GLYCOPYRROLATE 0.2 MG/ML IJ SOLN
INTRAMUSCULAR | Status: AC
  Filled 2023-04-27: qty 1

## 2023-04-27 MED ORDER — PHENYLEPHRINE 80 MCG/ML (10ML) SYRINGE FOR IV PUSH (FOR BLOOD PRESSURE SUPPORT)
PREFILLED_SYRINGE | INTRAVENOUS | Status: DC | PRN
  Administered 2023-04-27: 160 ug via INTRAVENOUS

## 2023-04-27 MED ORDER — BISACODYL 5 MG PO TBEC
20.0000 mg | DELAYED_RELEASE_TABLET | Freq: Once | ORAL | Status: AC
  Administered 2023-04-27: 20 mg via ORAL
  Filled 2023-04-27: qty 4

## 2023-04-27 MED ORDER — PEG 3350-KCL-NA BICARB-NACL 420 G PO SOLR
4000.0000 mL | Freq: Once | ORAL | Status: AC
  Administered 2023-04-27: 4000 mL via ORAL
  Filled 2023-04-27: qty 4000

## 2023-04-27 NOTE — TOC CM/SW Note (Signed)
Transition of Care [facility]) - Inpatient Brief Assessment   Patient Details  Name: Jose [name]. MRN: [id number] Date of Birth: 09-08-1971  Transition of Care [facility]) CM/SW Contact:    Kreg Shropshire, RN Phone Number: 04/27/2023, 11:09 AM   Clinical Narrative:  Cm assesed for TOC needs. Pt has pcp, transportation home, and receives all medication from pharmacy. Has cane to get around as needed  Cm offered substance abuse information/education. Pt declined at this time.  Transition of Care Asessment: Insurance and Status: Insurance coverage has been reviewed Patient has primary care physician: Yes Home environment has been reviewed: l Prior level of function:: independent Prior/Current Home Services: No current home services Social Determinants of Health Reivew: SDOH reviewed interventions complete (pt declined substance abuse education) Readmission risk has been reviewed: Yes Transition of care needs: no transition of care needs at this time

## 2023-04-27 NOTE — Anesthesia Preprocedure Evaluation (Addendum)
Anesthesia Evaluation  Patient identified by MRN, date of birth, ID band Patient awake    Reviewed: Allergy & Precautions, H&P , NPO status , Patient's Chart, lab work & pertinent test results, reviewed documented beta blocker date and time   Airway Mallampati: IV   Neck ROM: full    Dental  (+) Poor Dentition, Dental Advidsory Given   Pulmonary COPD,  COPD inhaler, Current Smoker   Pulmonary exam normal        Cardiovascular Exercise Tolerance: Poor hypertension, On Medications +CHF  Normal cardiovascular exam Rhythm:regular Rate:Normal     Neuro/Psych Seizures -, Well Controlled,   negative psych ROS   GI/Hepatic negative GI ROS, Neg liver ROS,,,  Endo/Other  negative endocrine ROS    Renal/GU negative Renal ROS  negative genitourinary   Musculoskeletal   Abdominal   Peds  Hematology  (+) Blood dyscrasia, anemia   Anesthesia Other Findings Past Medical History: No date: Alcohol abuse No date: Cervicalgia No date: CHF (congestive heart failure) (HCC) No date: COPD (chronic obstructive pulmonary disease) (HCC) No date: Hyperlipidemia No date: Hypertension No date: Seizures [facility]) Past Surgical History: No date: CHOLECYSTECTOMY 01/16/2018: COLONOSCOPY WITH PROPOFOL; N/A     Comment:  Procedure: COLONOSCOPY WITH PROPOFOL;  Surgeon: Toney Reil, MD;  Location: [facility] ENDOSCOPY;  Service:               Gastroenterology;  Laterality: N/A; 01/16/2018: ESOPHAGOGASTRODUODENOSCOPY (EGD) WITH PROPOFOL; N/A     Comment:  Procedure: ESOPHAGOGASTRODUODENOSCOPY (EGD) WITH               PROPOFOL;  Surgeon: Toney Reil, MD;  Location:               [facility] ENDOSCOPY;  Service: Gastroenterology;  Laterality:               N/A; No date: TONSILLECTOMY   Reproductive/Obstetrics negative OB ROS                             Anesthesia Physical Anesthesia Plan  ASA:  3  Anesthesia Plan: General   Post-op Pain Management: Minimal or no pain anticipated   Induction: Intravenous  PONV Risk Score and Plan: 3 and Propofol infusion, TIVA and Ondansetron  Airway Management Planned: Nasal Cannula  Additional Equipment: None  Intra-op Plan:   Post-operative Plan:   Informed Consent: I have reviewed the patients History and Physical, chart, labs and discussed the procedure including the risks, benefits and alternatives for the proposed anesthesia with the patient or authorized representative who has indicated his/her understanding and acceptance.     Dental advisory given  Plan Discussed with: CRNA and Surgeon  Anesthesia Plan Comments: (Discussed risks of anesthesia with patient, including possibility of difficulty with spontaneous ventilation under anesthesia necessitating airway intervention, PONV, and rare risks such as cardiac or respiratory or neurological events, and allergic reactions. Discussed the role of CRNA in patient's perioperative care. Patient understands.)       Anesthesia Quick Evaluation

## 2023-04-27 NOTE — Anesthesia Postprocedure Evaluation (Signed)
Anesthesia Post Note  Patient: Jose [name].  Procedure(s) Performed: ESOPHAGOGASTRODUODENOSCOPY (EGD) WITH PROPOFOL  Patient location during evaluation: PACU Anesthesia Type: General Level of consciousness: awake and alert Pain management: pain level controlled Vital Signs Assessment: post-procedure vital signs reviewed and stable Respiratory status: spontaneous breathing, nonlabored ventilation, respiratory function stable and patient connected to nasal cannula oxygen Cardiovascular status: blood pressure returned to baseline and stable Postop Assessment: no apparent nausea or vomiting Anesthetic complications: no  No notable events documented.   Last Vitals:  Vitals:   04/27/23 1336 04/27/23 1405  BP: 97/62 97/65  Pulse: 71 71  Resp: (!) 23 18  Temp:  36.6 C  SpO2: 100% 100%    Last Pain:  Vitals:   04/27/23 1336  TempSrc:   PainSc: 0-No pain                 Stephanie Coup

## 2023-04-27 NOTE — Transfer of Care (Signed)
Immediate Anesthesia Transfer of Care Note  Patient: Jose [name].  Procedure(s) Performed: ESOPHAGOGASTRODUODENOSCOPY (EGD) WITH PROPOFOL  Patient Location: Endoscopy Unit  Anesthesia Type:General  Level of Consciousness: drowsy  Airway & Oxygen Therapy: Patient Spontanous Breathing  Post-op Assessment: Report given to RN and Post -op Vital signs reviewed and stable  Post vital signs: Reviewed and stable  Last Vitals:  Vitals Value Taken Time  BP 88/60 04/27/23 1315  Temp 36.3 C 04/27/23 1312  Pulse 50 04/27/23 1316  Resp 23 04/27/23 1316  SpO2 99 % 04/27/23 1316  Vitals shown include unvalidated device data.  Last Pain:  Vitals:   04/27/23 1314  TempSrc:   PainSc: 0-No pain         Complications: No notable events documented.

## 2023-04-27 NOTE — Op Note (Signed)
[facility] Gastroenterology Patient Name: Jose [name] Procedure Date: 04/27/2023 11:56 AM MRN: [id number] Account #: [id number] Date of Birth: [birth year] Admit Type: Inpatient Age: 52 Room: [facility] ENDO ROOM 3 Gender: Male Note Status: Finalized Instrument Name: Laurette Schimke [id number] Procedure:             Upper GI endoscopy Indications:           Acute post hemorrhagic anemia, Melena Providers:             Boykin Nearing. Norma Fredrickson MD, MD Referring MD:          No Local Md, MD (Referring MD) Medicines:             Propofol per Anesthesia Complications:         No immediate complications. Procedure:             Pre-Anesthesia Assessment:                        - The risks and benefits of the procedure and the                         sedation options and risks were discussed with the                         patient. All questions were answered and informed                         consent was obtained.                        - Patient identification and proposed procedure were                         verified prior to the procedure by the nurse. The                         procedure was verified in the procedure room.                        - ASA Grade Assessment: III - A patient with severe                         systemic disease.                        - After reviewing the risks and benefits, the patient                         was deemed in satisfactory condition to undergo the                         procedure.                        After obtaining informed consent, the endoscope was                         passed under direct vision. Throughout the procedure,  the patient's blood pressure, pulse, and oxygen                         saturations were monitored continuously. The Endoscope                         was introduced through the mouth, and advanced to the                         third part of duodenum. The upper GI endoscopy was                          accomplished without difficulty. The patient tolerated                         the procedure well. Findings:      The esophagus was normal.      A single 7 mm angioectasia with no bleeding was found in the gastric       body. Coagulation for bleeding prevention using argon plasma at 0.8       liters/minute and 20 watts was successful.      The exam of the stomach was otherwise normal.      Patchy mildly erythematous mucosa without active bleeding and with no       stigmata of bleeding was found in the duodenal bulb.      The second portion of the duodenum and third portion of the duodenum       were normal. Impression:            - Normal esophagus.                        - A single non-bleeding angioectasia in the stomach.                         Treated with argon plasma coagulation (APC).                        - Erythematous duodenopathy.                        - Normal second portion of the duodenum and third                         portion of the duodenum.                        - No specimens collected. Recommendation:        - Return patient to hospital ward for ongoing care.                        - Perform a colonoscopy tomorrow.                        - The findings and recommendations were discussed with                         the patient and their family. Procedure Code(s):     --- Professional ---  43255, Esophagogastroduodenoscopy, flexible,                         transoral; with control of bleeding, any method Diagnosis Code(s):     --- Professional ---                        K92.1, Melena (includes Hematochezia)                        D62, Acute posthemorrhagic anemia                        K31.89, Other diseases of stomach and duodenum                        K31.819, Angiodysplasia of stomach and duodenum                         without bleeding CPT copyright 2022 American [employer] Association. All rights reserved. The codes  documented in this report are preliminary and upon coder review may  be revised to meet current compliance requirements. Stanton Kidney MD, MD 04/27/2023 1:11:31 PM This report has been signed electronically. Number of Addenda: 0 Note Initiated On: 04/27/2023 11:56 AM Estimated Blood Loss:  Estimated blood loss: none.      [facility]

## 2023-04-27 NOTE — Progress Notes (Signed)
PROGRESS NOTE  Jose [name]. ZOX:[id number] DOB: 26-Aug-1971 DOA: 04/26/2023 PCP: [name], NP  HPI/Recap of past 24 hours:  Jose [name]. is a 52 y.o. male with medical history significant of HFpEF, persistent atrial fibrillation on warfarin, alcohol use disorder on Tegretol complicated by alcohol withdrawal seizure, COPD, hypertension, thrombocytopenia, hyperlipidemia, who presents to the ED due to abnormal labs obtained at PCPs office.   Mr. Vodicka states that he has been experiencing melena for approximately 1 month ago.  He states that all his bowel movements are black in color but he thought this may be due to his medication.  He notes that he is on iron supplementation but has been on iron for much longer.  In the last 3-4 days, he is also noted new onset severe diffuse abdominal pain that occurs specifically after drinking alcohol.  He continues to drink half a pint of liquor per day.  In the last 1 week, he has noticed that his tremors seem worse and he has been experiencing worsening shortness of breath compared to baseline.  He notes chronic chest pain with exertion but states that this is generally unchanged.  He has been taking all his medications as prescribed as his mother helps him with this.   Per chart review, patient was seen at his PCPs office for follow-up at which time CMP was ordered that demonstrated BUN of 99 with creatinine of 2.29 and GFR of 33.  Potassium elevated at 5.4.  Bicarb was low at 16.CBC was obtained that demonstrated hemoglobin of 8.0 compared to prior 11.4.  04/27/2023: The patient was seen and examined at bedside.  He had no new complaints.  He is unsure if he completely wants to quit alcohol.  Wants time to think about it.  Last alcohol intake was 2 days ago.     Assessment/Plan: Principal Problem:   Upper GI bleed Active Problems:   Blood loss anemia   AKI (acute kidney injury) (HCC)   Hyperkalemia   Alcohol use disorder   Hypertension   COPD  (chronic obstructive pulmonary disease) (HCC)   Chronic diastolic CHF (congestive heart failure) (HCC)   Seizure disorder (HCC)   Persistent atrial fibrillation (HCC)  Upper GI bleed Patient is presenting with approximately 1 month of melena, with several day history of diffuse abdominal pain after alcohol use, worsening shortness of breath, and tremor with significantly elevated BUN, overall consistent with an upper GI bleed.  Likely triggered by daily alcohol use.   - GI consulted; appreciate their recommendations - N.p.o. - S/p Protonix 80 mg IV once in the ED - Continue Protonix 40 mg IV twice daily Plan for EGD on 04/27/2023.   Acute blood loss anemia Suspected to be secondary to GI bleed.  Hemoglobin decreased from 8.0 to 7.3 over the last 24 hours.  Previous to this, hemoglobin was 11 to 12.    - 1 unit of packed RBCs ordered in the ED - Posttransfusion CBC Transfuse for hemoglobin less than 8.   AKI (acute kidney injury) (HCC) Per chart review, patient's renal function tends to fluctuate but seems to be between 0.7 and 1.1 at baseline.  Yesterday when checked at PCPs office it was elevated 2.29 and has now improved to 1.69.  BUN is markedly elevated compared to creatinine, which I suspect is secondary to GI bleed.  Overall, AKI is likely due to fluid loss with GI bleed.  No obstructive evidence on CT.   - Avoid nephrotoxic  agents - 1 unit of packed RBCs ordered - Hold off on any further diuretics today - Repeat BMP in the a.m.   Hyperkalemia Likely due to AKI.  Hyperkalemia is mild with no EKG changes.  He has received Lasix in addition to insulin/dextrose in the ED.  No further intervention at this time.   - Repeat BMP in the a.m.   Alcohol use disorder Patient has a history of alcohol use disorder complicated by seizures during withdrawal.   - CIWA with as needed Ativan - Daily thiamine, folic acid and multivitamin - Continue home Tegretol   Persistent atrial  fibrillation (HCC) - Holding home warfarin in the setting of GI bleed - Hold home metoprolol and diltiazem in light of low BP   Seizure disorder (HCC) In the setting of alcohol withdrawal.   -CIWA monitoring with Ativan as needed   Chronic diastolic CHF (congestive heart failure) (HCC) Patient appears euvolemic at this time with no evidence of peripheral or pulmonary edema.  Will hold home diuretics for today in the setting of AKI.   -Restart home GDMT and diuretics tomorrow if improvement in AKI   COPD (chronic obstructive pulmonary disease) (HCC) Patient endorses worsening shortness of breath that I suspect is secondary to anemia.  No cough or increased sputum production.  No wheezing on examination.   -Continue home bronchodilators   Hypertension Blood pressure is on the lower end of normal today   - Hold home antihypertensives    Advance Care Planning:   Code Status: Full Code    Consults: GI   Family Communication: Patient's mother updated at bedside       Status is: Observation     Objective: Vitals:   04/27/23 1322 04/27/23 1332 04/27/23 1336 04/27/23 1405  BP: (!) 109/48 (!) 87/63 97/62 97/65   Pulse: 90 89 71 71  Resp: 14 17 (!) 23 18  Temp:    97.9 F (36.6 C)  TempSrc:      SpO2: 99% 100% 100% 100%  Weight:      Height:        Intake/Output Summary (Last 24 hours) at 04/27/2023 1800 Last data filed at 04/27/2023 1316 Gross per 24 hour  Intake 907 ml  Output 2025 ml  Net -1118 ml   Filed Weights   04/26/23 1018 04/26/23 1700  Weight: 91.2 kg 88.7 kg    Exam:  General: 52 y.o. year-old male well developed well nourished in no acute distress.  Alert and oriented x3. Cardiovascular: Regular rate and rhythm with no rubs or gallops.  No thyromegaly or JVD noted.   Respiratory: Clear to auscultation with no wheezes or rales. Good inspiratory effort. Abdomen: Soft nontender nondistended with normal bowel sounds x4 quadrants. Musculoskeletal: No  lower extremity edema. 2/4 pulses in all 4 extremities. Skin: No ulcerative lesions noted or rashes, Psychiatry: Mood is appropriate for condition and setting   Data Reviewed: CBC: Recent Labs  Lab 04/25/23 1045 04/26/23 1132 04/27/23 0449  WBC 5.8 5.5 5.8  NEUTROABS 3.5  --   --   HGB 8.0* 7.3* 8.4*  HCT 23.4* 22.9* 25.7*  MCV 99* 103.2* 100.4*  PLT 178 173 147*   Basic Metabolic Panel: Recent Labs  Lab 04/25/23 1045 04/26/23 1132 04/27/23 0449  NA 138 135 136  K 5.4* 5.3* 4.2  CL 104 104 104  CO2 16* 20* 21*  GLUCOSE 97 115* 100*  BUN 99* 96* 75*  CREATININE 2.29* 1.69* 0.90  CALCIUM 9.1 8.4* 8.7*  MG 2.3 2.1  --    GFR: Estimated Creatinine Clearance: 99.6 mL/min (by C-G formula based on SCr of 0.9 mg/dL). Liver Function Tests: Recent Labs  Lab 04/25/23 1045 04/26/23 1132 04/27/23 0449  AST 37 60* 47*  ALT 17 23 23   ALKPHOS 101 90 80  BILITOT <0.2 0.5 0.9  PROT 6.4 6.7 6.7  ALBUMIN 3.9 3.6 3.6   No results for input(s): "LIPASE", "AMYLASE" in the last 168 hours. No results for input(s): "AMMONIA" in the last 168 hours. Coagulation Profile: Recent Labs  Lab 04/26/23 1132 04/27/23 0825  INR 2.3* 1.5*   Cardiac Enzymes: No results for input(s): "CKTOTAL", "CKMB", "CKMBINDEX", "TROPONINI" in the last 168 hours. BNP (last 3 results) No results for input(s): "PROBNP" in the last 8760 hours. HbA1C: No results for input(s): "HGBA1C" in the last 72 hours. CBG: Recent Labs  Lab 04/26/23 1330  GLUCAP 111*   Lipid Profile: Recent Labs    04/25/23 1045  CHOL 165  HDL 97  LDLCALC 56  TRIG 60  CHOLHDL 1.7   Thyroid Function Tests: No results for input(s): "TSH", "T4TOTAL", "FREET4", "T3FREE", "THYROIDAB" in the last 72 hours. Anemia Panel: No results for input(s): "VITAMINB12", "FOLATE", "FERRITIN", "TIBC", "IRON", "RETICCTPCT" in the last 72 hours. Urine analysis:    Component Value Date/Time   COLORURINE STRAW (A) 04/26/2023 1132    APPEARANCEUR CLEAR (A) 04/26/2023 1132   APPEARANCEUR Clear 05/12/2020 0830   LABSPEC 1.009 04/26/2023 1132   PHURINE 5.0 04/26/2023 1132   GLUCOSEU 50 (A) 04/26/2023 1132   HGBUR SMALL (A) 04/26/2023 1132   BILIRUBINUR NEGATIVE 04/26/2023 1132   BILIRUBINUR Negative 05/12/2020 0830   KETONESUR NEGATIVE 04/26/2023 1132   PROTEINUR NEGATIVE 04/26/2023 1132   NITRITE NEGATIVE 04/26/2023 1132   LEUKOCYTESUR NEGATIVE 04/26/2023 1132   Sepsis Labs: @LABRCNTIP (procalcitonin:4,lacticidven:4)  )No results found for this or any previous visit (from the past 240 hour(s)).    Studies: No results found.  Scheduled Meds:  carbamazepine  200 mg Oral BID   fluticasone furoate-vilanterol  1 puff Inhalation Daily   And   umeclidinium bromide  1 puff Inhalation Daily   folic acid  1 mg Oral Daily   multivitamin with minerals  1 tablet Oral Daily   pantoprazole (PROTONIX) IV  40 mg Intravenous Q12H   polyethylene glycol-electrolytes  4,000 mL Oral Once   rosuvastatin  20 mg Oral Daily   thiamine  100 mg Oral Daily   Or   thiamine  100 mg Intravenous Daily    Continuous Infusions:   LOS: 0 days     Darlin Drop, MD [facility] Pager [phone]  If 7PM-7AM, please contact night-coverage [email or web address] Password [email or web address] 04/27/2023, 6:00 PM

## 2023-04-28 ENCOUNTER — Observation Stay: Payer: BC Managed Care – PPO | Admitting: Anesthesiology

## 2023-04-28 ENCOUNTER — Encounter
Admission: EM | Disposition: A | Discharge status: Home / Self Care | Payer: Self-pay | Source: Home / Self Care | Attending: Emergency Medicine

## 2023-04-28 ENCOUNTER — Encounter: Payer: Self-pay | Admitting: Internal Medicine

## 2023-04-28 DIAGNOSIS — K922 Gastrointestinal hemorrhage, unspecified: Secondary | ICD-10-CM | POA: Diagnosis not present

## 2023-04-28 DIAGNOSIS — K921 Melena: Secondary | ICD-10-CM | POA: Diagnosis not present

## 2023-04-28 DIAGNOSIS — K649 Unspecified hemorrhoids: Secondary | ICD-10-CM | POA: Diagnosis not present

## 2023-04-28 DIAGNOSIS — K5730 Diverticulosis of large intestine without perforation or abscess without bleeding: Secondary | ICD-10-CM | POA: Diagnosis not present

## 2023-04-28 DIAGNOSIS — K640 First degree hemorrhoids: Secondary | ICD-10-CM | POA: Diagnosis not present

## 2023-04-28 DIAGNOSIS — D649 Anemia, unspecified: Secondary | ICD-10-CM | POA: Diagnosis not present

## 2023-04-28 DIAGNOSIS — D62 Acute posthemorrhagic anemia: Secondary | ICD-10-CM | POA: Diagnosis not present

## 2023-04-28 HISTORY — PX: COLONOSCOPY: SHX5424

## 2023-04-28 LAB — BASIC METABOLIC PANEL
Anion gap: 10 (ref 5–15)
BUN: 41 mg/dL — ABNORMAL HIGH (ref 6–20)
CO2: 23 mmol/L (ref 22–32)
Calcium: 8.7 mg/dL — ABNORMAL LOW (ref 8.9–10.3)
Chloride: 104 mmol/L (ref 98–111)
Creatinine, Ser: 0.82 mg/dL (ref 0.61–1.24)
GFR, Estimated: >60 mL/min (ref >60)
Glucose, Bld: 106 mg/dL — ABNORMAL HIGH (ref 70–99)
Potassium: 3.8 mmol/L (ref 3.5–5.1)
Sodium: 137 mmol/L (ref 135–145)

## 2023-04-28 LAB — CBC
HCT: 24.6 % — ABNORMAL LOW (ref 39.0–52.0)
Hemoglobin: 7.9 g/dL — ABNORMAL LOW (ref 13.0–17.0)
MCH: 32.8 pg (ref 26.0–34.0)
MCHC: 32.1 g/dL (ref 30.0–36.0)
MCV: 102.1 fL — ABNORMAL HIGH (ref 80.0–100.0)
Platelets: 130 10*3/uL — ABNORMAL LOW (ref 150–400)
RBC: 2.41 MIL/uL — ABNORMAL LOW (ref 4.22–5.81)
RDW: 17.1 % — ABNORMAL HIGH (ref 11.5–15.5)
WBC: 6.6 10*3/uL (ref 4.0–10.5)
nRBC: 0.3 % — ABNORMAL HIGH (ref 0.0–0.2)

## 2023-04-28 SURGERY — COLONOSCOPY
Anesthesia: General

## 2023-04-28 MED ORDER — PROPOFOL 500 MG/50ML IV EMUL
INTRAVENOUS | Status: DC | PRN
  Administered 2023-04-28: 165 ug/kg/min via INTRAVENOUS

## 2023-04-28 MED ORDER — PROPOFOL 10 MG/ML IV BOLUS
INTRAVENOUS | Status: AC
  Filled 2023-04-28: qty 20

## 2023-04-28 MED ORDER — PROPOFOL 10 MG/ML IV BOLUS
INTRAVENOUS | Status: DC | PRN
  Administered 2023-04-28: 70 mg via INTRAVENOUS
  Administered 2023-04-28: 30 mg via INTRAVENOUS

## 2023-04-28 MED ORDER — MIDAZOLAM HCL 2 MG/2ML IJ SOLN
INTRAMUSCULAR | Status: AC
  Filled 2023-04-28: qty 2

## 2023-04-28 MED ORDER — ESMOLOL HCL 100 MG/10ML IV SOLN
INTRAVENOUS | Status: DC | PRN
  Administered 2023-04-28: 10 mg via INTRAVENOUS

## 2023-04-28 MED ORDER — PROPOFOL 1000 MG/100ML IV EMUL
INTRAVENOUS | Status: AC
  Filled 2023-04-28: qty 100

## 2023-04-28 MED ORDER — GLYCOPYRROLATE 0.2 MG/ML IJ SOLN
INTRAMUSCULAR | Status: DC | PRN
  Administered 2023-04-28: .2 mg via INTRAVENOUS

## 2023-04-28 MED ORDER — MIDAZOLAM HCL 2 MG/2ML IJ SOLN
INTRAMUSCULAR | Status: DC | PRN
  Administered 2023-04-28: 2 mg via INTRAVENOUS

## 2023-04-28 MED ORDER — ADULT MULTIVITAMIN W/MINERALS CH: 1.0000 | ORAL_TABLET | Freq: Every day | ORAL | 0 refills | Status: AC

## 2023-04-28 MED ORDER — VASOPRESSIN 20 UNIT/ML IV SOLN
INTRAVENOUS | Status: DC | PRN
  Administered 2023-04-28: 2 [IU] via INTRAVENOUS

## 2023-04-28 MED ORDER — FOLIC ACID 1 MG PO TABS: 1.0000 mg | ORAL_TABLET | Freq: Every day | ORAL | 0 refills | Status: DC

## 2023-04-28 MED ORDER — LIDOCAINE HCL (CARDIAC) PF 100 MG/5ML IV SOSY
PREFILLED_SYRINGE | INTRAVENOUS | Status: DC | PRN
  Administered 2023-04-28: 100 mg via INTRAVENOUS

## 2023-04-28 MED ORDER — VITAMIN B-1 100 MG PO TABS: 100.0000 mg | ORAL_TABLET | Freq: Every day | ORAL | 0 refills | Status: AC

## 2023-04-28 NOTE — Discharge Summary (Signed)
Discharge Summary  Jose [name]. MWU:[id number] DOB: 09/06/1971  PCP: [name], NP  Admit date: 04/26/2023 Discharge date: 04/28/2023  Time spent: 35 minutes   Recommendations for Outpatient Follow-up:  Follow up with your PCP in 1-2 weeks   Discharge Diagnoses:  Active Hospital Problems   Diagnosis Date Noted   Upper GI bleed 04/26/2023    Priority: 1.   Blood loss anemia 04/26/2023    Priority: 2.   AKI (acute kidney injury) (HCC) 04/26/2023    Priority: 3.   Hyperkalemia 04/26/2023    Priority: 4.   Alcohol use disorder 04/26/2023    Priority: 5.   Persistent atrial fibrillation (HCC) 12/19/2022   Seizure disorder (HCC) 11/11/2022   Chronic diastolic CHF (congestive heart failure) (HCC) 09/08/2020   COPD (chronic obstructive pulmonary disease) (HCC) 04/30/2015   Hypertension 04/30/2015    Resolved Hospital Problems  No resolved problems to display.    Discharge Condition: Stable   Diet recommendation: Resume previous diet.  Avoid alcoholic beverages.   Vitals:   04/28/23 1604 04/28/23 1643  BP: 100/74 123/89  Pulse: 82 79  Resp: 20   Temp:  98.2 F (36.8 C)  SpO2: 98% 100%    History of present illness:  Jose [name]. is a 52 y.o. male with medical history significant of HFpEF, persistent atrial fibrillation on warfarin, alcohol use disorder on Tegretol complicated by alcohol withdrawal seizures, COPD, hypertension, chronic thrombocytopenia, hyperlipidemia, who presents to the ED due to abnormal labs obtained at PCPs office.  Endorses melena for the past month.  In the last 3-4 days, he is also noted new onset severe diffuse abdominal pain that occurs specifically after drinking alcohol.  He continues to drink half a pint of liquor per day.     Per chart review, patient was seen at his PCPs office for follow-up at which time CMP was ordered that demonstrated BUN of 99 with creatinine of 2.29 and GFR of 33.  Potassium elevated at 5.4.  Bicarb was low at  16.CBC was obtained that demonstrated hemoglobin of 8.0 compared to prior 11.4.   Admitted for upper GI bleed with concern for acute blood loss anemia.  Seen by GI.  Post EGD on 04/27/2023 which revealed single nonbleeding angiectasia treated with APC, erythematous duodenum with plan for colonoscopy on 04/28/2023.   04/28/2023: The patient was seen and examined at bedside.  He had no new complaints.  He completed his bowel prep.  Plan for colonoscopy this afternoon.  Colonoscopy revealed diverticulosis and internal hemorrhoids.  Hospital Course:  Principal Problem:   Upper GI bleed Active Problems:   Blood loss anemia   AKI (acute kidney injury) (HCC)   Hyperkalemia   Alcohol use disorder   Hypertension   COPD (chronic obstructive pulmonary disease) (HCC)   Chronic diastolic CHF (congestive heart failure) (HCC)   Seizure disorder (HCC)   Persistent atrial fibrillation (HCC)  Suspected upper GI bleed Patient is presenting with approximately 1 month of melena, with several day history of diffuse abdominal pain after alcohol use, worsening shortness of breath, and tremor with significantly elevated BUN, overall consistent with an upper GI bleed.  Likely triggered by daily alcohol use. Seen by GI, post EGD with findings as stated above.  Colonoscopy completed on 04/28/23 Colonoscopy revealed diverticulosis and internal hemorrhoids. Currently on IV Protonix 40 mg twice daily.   Acute blood loss anemia in the setting of suspected upper GI bleed. Hemoglobin 7.9 from 8.4. The patient has  received 1 unit PRBCs. Continue to monitor H&H.   Resolved AKI (acute kidney injury) (HCC) Continue to avoid nephrotoxic agents, dehydration and hypotension.   Resolved hyperkalemia   Alcohol use disorder Alcohol cessation counseling done at bedside. No evidence of alcohol withdrawal at the time of this visit. Continue CIWA protocol Continue thiamine, folic acid, multivitamin supplement.   Persistent  atrial fibrillation (HCC) - Holding home warfarin in the setting of GI bleed - Hold home metoprolol and diltiazem in light of low BP   Seizure disorder (HCC) In the setting of alcohol withdrawal. -CIWA monitoring with Ativan as needed Resume home Tegretol   Chronic diastolic CHF (congestive heart failure) (HCC) Euvolemic on exam BPs are soft Home heart failure medications help to avoid hypotension.   COPD (chronic obstructive pulmonary disease) (HCC) Currently stable. -Continue home bronchodilators   Hypertension Blood pressure is on the lower end of normal  - Hold home antihypertensives  Continue to monitor vital signs.   Advance Care Planning:   Code Status: Full Code    Consults: GI    Discharge Exam: BP 123/89 (BP Location: Right Arm)   Pulse 79   Temp 98.2 F (36.8 C) (Oral)   Resp 20   Ht 5' 10.5" (1.791 m)   Wt 88.7 kg   SpO2 100%   BMI 27.66 kg/m  General: 52 y.o. year-old male well developed well nourished in no acute distress.  Alert and oriented x3. Cardiovascular: Regular rate and rhythm with no rubs or gallops.  No thyromegaly or JVD noted.   Respiratory: Clear to auscultation with no wheezes or rales. Good inspiratory effort. Abdomen: Soft nontender nondistended with normal bowel sounds x4 quadrants. Musculoskeletal: No lower extremity edema. 2/4 pulses in all 4 extremities. Skin: No ulcerative lesions noted or rashes, Psychiatry: Mood is appropriate for condition and setting  Discharge Instructions You were cared for by a hospitalist during your hospital stay. If you have any questions about your discharge medications or the care you received while you were in the hospital after you are discharged, you can call the unit and asked to speak with the hospitalist on call if the hospitalist that took care of you is not available. Once you are discharged, your primary care physician will handle any further medical issues. Please note that NO REFILLS for any  discharge medications will be authorized once you are discharged, as it is imperative that you return to your primary care physician (or establish a relationship with a primary care physician if you do not have one) for your aftercare needs so that they can reassess your need for medications and monitor your lab values.   Allergies as of 04/28/2023   No Known Allergies      Medication List     STOP taking these medications    diltiazem 360 MG 24 hr capsule Commonly known as: CARDIZEM CD   sacubitril-valsartan 49-51 MG Commonly known as: ENTRESTO       TAKE these medications    albuterol 108 (90 Base) MCG/ACT inhaler Commonly known as: VENTOLIN HFA Inhale 2 puffs into the lungs every 6 (six) hours as needed for wheezing or shortness of breath.   carbamazepine 200 MG tablet Commonly known as: TEGRETOL Take 1 tablet (200 mg total) by mouth 2 (two) times daily.   empagliflozin 10 MG Tabs tablet Commonly known as: Jardiance Take 1 tablet (10 mg total) by mouth daily before breakfast.   folic acid 1 MG tablet Commonly known as: FOLVITE Take  1 tablet (1 mg total) by mouth daily. Start taking on: April 29, 2023   furosemide 40 MG tablet Commonly known as: LASIX Take 1 tablet (40 mg total) by mouth every morning.   Mag-Oxide 200 MG Tabs Generic drug: Magnesium Oxide -Mg Supplement Take 1 tablet (200 mg total) by mouth 2 (two) times daily.   metoprolol tartrate 25 MG tablet Commonly known as: LOPRESSOR TAKE 3 TABLETS BY MOUTH TWICE DAILY   multivitamin with minerals Tabs tablet Take 1 tablet by mouth daily. Start taking on: April 29, 2023   potassium chloride 10 MEQ tablet Commonly known as: KLOR-CON Take 20 mEq by mouth daily.   rosuvastatin 20 MG tablet Commonly known as: Crestor Take 1 tablet (20 mg total) by mouth daily.   spironolactone 25 MG tablet Commonly known as: ALDACTONE Take 1 tablet (25 mg total) by mouth daily.   SV Iron 325 (65 FE) MG  tablet Generic drug: ferrous sulfate Take 1 tablet by mouth once daily   thiamine 100 MG tablet Commonly known as: Vitamin B-1 Take 1 tablet (100 mg total) by mouth daily. Start taking on: April 29, 2023   Trelegy Ellipta 100-62.5-25 MCG/ACT Aepb Generic drug: Fluticasone-Umeclidin-Vilant Inhale 1 puff into the lungs daily.   warfarin 5 MG tablet Commonly known as: COUMADIN Take as directed. If you are unsure how to take this medication, talk to your nurse or doctor. Original instructions: Take 1 tablet (5 mg total) by mouth as directed.       No Known Allergies  Follow-up Information     [name], NP. Call today.   Specialty: Nurse Practitioner Why: Please call for a post hospital follow up appointment Contact information: [address] [place] [address] [phone]         Stanton Kidney, MD. Call today.   Specialty: Gastroenterology Why: Please call for a post hospital follow up appointment Contact information: [address] [place] [address] [phone]                  The results of significant diagnostics from this hospitalization (including imaging, microbiology, ancillary and laboratory) are listed below for reference.    Significant Diagnostic Studies: DG Chest Port 1 View  Result Date: 04/26/2023 CLINICAL DATA:  Shortness of breath. EXAM: PORTABLE CHEST 1 VIEW COMPARISON:  11/11/2022 FINDINGS: Borderline cardiopericardial silhouette. Mild central vascular congestion and peribronchial thickening. No consolidation, pneumothorax or effusion. Overlapping cardiac leads. Calcified aorta. IMPRESSION: Enlarged cardiopericardial silhouette with some vascular congestion and peribronchial thickening. Electronically Signed   By: Karen Kays M.D.   On: 04/26/2023 11:33   CT Renal Stone Study  Result Date: 04/26/2023 CLINICAL DATA:  Elevated BUN and creatinine. Question kidney stone. Flank pain. EXAM: CT ABDOMEN AND PELVIS  WITHOUT CONTRAST TECHNIQUE: Multidetector CT imaging of the abdomen and pelvis was performed following the standard protocol without IV contrast. RADIATION DOSE REDUCTION: This exam was performed according to the departmental dose-optimization program which includes automated exposure control, adjustment of the mA and/or kV according to patient size and/or use of iterative reconstruction technique. COMPARISON:  None Available. FINDINGS: Lower chest: Unremarkable. Hepatobiliary: No suspicious focal abnormality in the liver on this study without intravenous contrast. Gallbladder decompressed. No intrahepatic or extrahepatic biliary dilation. Pancreas: No focal mass lesion. No dilatation of the main duct. No intraparenchymal cyst. No peripancreatic edema. Spleen: No splenomegaly. No suspicious focal mass lesion. Adrenals/Urinary Tract: No adrenal nodule or mass. 9 mm subcapsular lesion interpolar right kidney  has attenuation too high to be a simple cyst. 2 mm nonobstructing stone identified interpolar left kidney no ureteral or bladder stones. No hydroureteronephrosis. Stomach/Bowel: Stomach is unremarkable. No gastric wall thickening. No evidence of outlet obstruction. Duodenum is normally positioned as is the ligament of Treitz. No small bowel wall thickening. No small bowel dilatation. The terminal ileum is normal. The appendix is not well visualized, but there is no edema or inflammation in the region of the cecum. No gross colonic mass. No colonic wall thickening. Vascular/Lymphatic: There is moderate atherosclerotic calcification of the abdominal aorta without aneurysm. There is no gastrohepatic or hepatoduodenal ligament lymphadenopathy. No retroperitoneal or mesenteric lymphadenopathy. No pelvic sidewall lymphadenopathy. Reproductive: The prostate gland and seminal vesicles are unremarkable. Other: No intraperitoneal free fluid. Musculoskeletal: No worrisome lytic or sclerotic osseous abnormality. IMPRESSION:  1. 2 mm nonobstructing stone interpolar left kidney. No ureteral or bladder stones. No hydroureteronephrosis. 2. 9 mm subcapsular lesion interpolar right kidney has attenuation too high to be a simple cyst. While this is most likely a cyst complicated by proteinaceous debris or hemorrhage, nonemergent outpatient MRI of the abdomen with and without contrast recommended to further evaluate. 3.  Aortic Atherosclerosis (ICD10-I70.0). Electronically Signed   By: Kennith Center M.D.   On: 04/26/2023 11:22    Microbiology: No results found for this or any previous visit (from the past 240 hour(s)).   Labs: Basic Metabolic Panel: Recent Labs  Lab 04/25/23 1045 04/26/23 1132 04/27/23 0449 04/28/23 0550  NA 138 135 136 137  K 5.4* 5.3* 4.2 3.8  CL 104 104 104 104  CO2 16* 20* 21* 23  GLUCOSE 97 115* 100* 106*  BUN 99* 96* 75* 41*  CREATININE 2.29* 1.69* 0.90 0.82  CALCIUM 9.1 8.4* 8.7* 8.7*  MG 2.3 2.1  --   --    Liver Function Tests: Recent Labs  Lab 04/25/23 1045 04/26/23 1132 04/27/23 0449  AST 37 60* 47*  ALT 17 23 23   ALKPHOS 101 90 80  BILITOT <0.2 0.5 0.9  PROT 6.4 6.7 6.7  ALBUMIN 3.9 3.6 3.6   No results for input(s): "LIPASE", "AMYLASE" in the last 168 hours. No results for input(s): "AMMONIA" in the last 168 hours. CBC: Recent Labs  Lab 04/25/23 1045 04/26/23 1132 04/27/23 0449 04/28/23 0550  WBC 5.8 5.5 5.8 6.6  NEUTROABS 3.5  --   --   --   HGB 8.0* 7.3* 8.4* 7.9*  HCT 23.4* 22.9* 25.7* 24.6*  MCV 99* 103.2* 100.4* 102.1*  PLT 178 173 147* 130*   Cardiac Enzymes: No results for input(s): "CKTOTAL", "CKMB", "CKMBINDEX", "TROPONINI" in the last 168 hours. BNP: BNP (last 3 results) Recent Labs    12/29/22 1633 02/22/23 1233 04/26/23 1132  BNP 58.2 30.5 27.2    ProBNP (last 3 results) No results for input(s): "PROBNP" in the last 8760 hours.  CBG: Recent Labs  Lab 04/26/23 1330  GLUCAP 111*       Signed:  Darlin Drop, MD [facility] 04/28/2023, 6:21 PM

## 2023-04-28 NOTE — Progress Notes (Signed)
PROGRESS NOTE  Jose [name]. [id number]:[id number] DOB: 05-Nov-1971 DOA: 04/26/2023 PCP: [name], NP  HPI/Recap of past 24 hours: Jose [name]. is a 52 y.o. male with medical history significant of HFpEF, persistent atrial fibrillation on warfarin, alcohol use disorder on Tegretol complicated by alcohol withdrawal seizures, COPD, hypertension, chronic thrombocytopenia, hyperlipidemia, who presents to the ED due to abnormal labs obtained at PCPs office.  Endorses melena for the past month.  In the last 3-4 days, he is also noted new onset severe diffuse abdominal pain that occurs specifically after drinking alcohol.  He continues to drink half a pint of liquor per day.    Per chart review, patient was seen at his PCPs office for follow-up at which time CMP was ordered that demonstrated BUN of 99 with creatinine of 2.29 and GFR of 33.  Potassium elevated at 5.4.  Bicarb was low at 16.CBC was obtained that demonstrated hemoglobin of 8.0 compared to prior 11.4.  Admitted for upper GI bleed with concern for acute blood loss anemia.  Seen by GI.  Post EGD on 04/27/2023 which revealed single nonbleeding angiectasia treated with APC, erythematous duodenum with plan for colonoscopy on 04/28/2023.  04/28/2023: The patient was seen and examined at bedside.  He had no new complaints.  He completed his bowel prep.  Plan for colonoscopy this afternoon.    Assessment/Plan: Principal Problem:   Upper GI bleed Active Problems:   Blood loss anemia   AKI (acute kidney injury) (HCC)   Hyperkalemia   Alcohol use disorder   Hypertension   COPD (chronic obstructive pulmonary disease) (HCC)   Chronic diastolic CHF (congestive heart failure) (HCC)   Seizure disorder (HCC)   Persistent atrial fibrillation (HCC)  Suspected upper GI bleed Patient is presenting with approximately 1 month of melena, with several day history of diffuse abdominal pain after alcohol use, worsening shortness of breath, and tremor with  significantly elevated BUN, overall consistent with an upper GI bleed.  Likely triggered by daily alcohol use. Seen by GI, post EGD with findings as stated above.  Plan for colonoscopy this afternoon. Currently on IV Protonix 40 mg twice daily.  Acute blood loss anemia in the setting of suspected upper GI bleed. Hemoglobin 7.9 from 8.4. The patient has received 1 unit PRBCs. Continue to monitor H&H.   Resolved AKI (acute kidney injury) (HCC) Continue to avoid nephrotoxic agents, dehydration and hypotension.   Resolved hyperkalemia  Alcohol use disorder Alcohol cessation counseling done at bedside. No evidence of alcohol withdrawal at the time of this visit. Continue CIWA protocol Continue thiamine, folic acid, multivitamin supplement.   Persistent atrial fibrillation (HCC) - Holding home warfarin in the setting of GI bleed - Hold home metoprolol and diltiazem in light of low BP   Seizure disorder (HCC) In the setting of alcohol withdrawal. -CIWA monitoring with Ativan as needed Resume home Tegretol   Chronic diastolic CHF (congestive heart failure) (HCC) Euvolemic on exam BPs are soft Home heart failure medications help to avoid hypotension.   COPD (chronic obstructive pulmonary disease) (HCC) Currently stable. -Continue home bronchodilators   Hypertension Blood pressure is on the lower end of normal  - Hold home antihypertensives  Continue to monitor vital signs.   Advance Care Planning:   Code Status: Full Code    Consults: GI   Family Communication: Patient's mother updated at bedside       Status is: Observation     Objective: Vitals:   04/27/23 1405  04/27/23 2018 04/28/23 0324 04/28/23 0903  BP: 97/65 120/80 (!) 104/58 97/66  Pulse: 71 61 80 74  Resp: 18 20 20 16   Temp: 97.9 F (36.6 C) 97.8 F (36.6 C) 98.6 F (37 C) 98.4 F (36.9 C)  TempSrc:  Oral Oral   SpO2: 100% 100% 98% 98%  Weight:      Height:        Intake/Output Summary (Last  24 hours) at 04/28/2023 1420 Last data filed at 04/28/2023 0318 Gross per 24 hour  Intake 120 ml  Output --  Net 120 ml   Filed Weights   04/26/23 1018 04/26/23 1700  Weight: 91.2 kg 88.7 kg    Exam:  General: 52 y.o. year-old male well-developed no nausea no distress.  He is alert and oriented x 3. Cardiovascular: Regular rate and rhythm with no rubs or gallops.  No thyromegaly or JVD noted.   Respiratory: Clear to auscultation no wheezes or rales.   Abdomen: Soft nontender nondistended with normal bowel sounds x4 quadrants. Musculoskeletal: No lower extremity edema. 2/4 pulses in all 4 extremities. Skin: No ulcerative lesions noted or rashes, Psychiatry: Mood is appropriate for condition and setting   Data Reviewed: CBC: Recent Labs  Lab 04/25/23 1045 04/26/23 1132 04/27/23 0449 04/28/23 0550  WBC 5.8 5.5 5.8 6.6  NEUTROABS 3.5  --   --   --   HGB 8.0* 7.3* 8.4* 7.9*  HCT 23.4* 22.9* 25.7* 24.6*  MCV 99* 103.2* 100.4* 102.1*  PLT 178 173 147* 130*   Basic Metabolic Panel: Recent Labs  Lab 04/25/23 1045 04/26/23 1132 04/27/23 0449 04/28/23 0550  NA 138 135 136 137  K 5.4* 5.3* 4.2 3.8  CL 104 104 104 104  CO2 16* 20* 21* 23  GLUCOSE 97 115* 100* 106*  BUN 99* 96* 75* 41*  CREATININE 2.29* 1.69* 0.90 0.82  CALCIUM 9.1 8.4* 8.7* 8.7*  MG 2.3 2.1  --   --    GFR: Estimated Creatinine Clearance: 109.3 mL/min (by C-G formula based on SCr of 0.82 mg/dL). Liver Function Tests: Recent Labs  Lab 04/25/23 1045 04/26/23 1132 04/27/23 0449  AST 37 60* 47*  ALT 17 23 23   ALKPHOS 101 90 80  BILITOT <0.2 0.5 0.9  PROT 6.4 6.7 6.7  ALBUMIN 3.9 3.6 3.6   No results for input(s): "LIPASE", "AMYLASE" in the last 168 hours. No results for input(s): "AMMONIA" in the last 168 hours. Coagulation Profile: Recent Labs  Lab 04/26/23 1132 04/27/23 0825  INR 2.3* 1.5*   Cardiac Enzymes: No results for input(s): "CKTOTAL", "CKMB", "CKMBINDEX", "TROPONINI" in the last  168 hours. BNP (last 3 results) No results for input(s): "PROBNP" in the last 8760 hours. HbA1C: No results for input(s): "HGBA1C" in the last 72 hours. CBG: Recent Labs  Lab 04/26/23 1330  GLUCAP 111*   Lipid Profile: No results for input(s): "CHOL", "HDL", "LDLCALC", "TRIG", "CHOLHDL", "LDLDIRECT" in the last 72 hours.  Thyroid Function Tests: No results for input(s): "TSH", "T4TOTAL", "FREET4", "T3FREE", "THYROIDAB" in the last 72 hours. Anemia Panel: No results for input(s): "VITAMINB12", "FOLATE", "FERRITIN", "TIBC", "IRON", "RETICCTPCT" in the last 72 hours. Urine analysis:    Component Value Date/Time   COLORURINE STRAW (A) 04/26/2023 1132   APPEARANCEUR CLEAR (A) 04/26/2023 1132   APPEARANCEUR Clear 05/12/2020 0830   LABSPEC 1.009 04/26/2023 1132   PHURINE 5.0 04/26/2023 1132   GLUCOSEU 50 (A) 04/26/2023 1132   HGBUR SMALL (A) 04/26/2023 1132   BILIRUBINUR NEGATIVE 04/26/2023  1132   BILIRUBINUR Negative 05/12/2020 0830   KETONESUR NEGATIVE 04/26/2023 1132   PROTEINUR NEGATIVE 04/26/2023 1132   NITRITE NEGATIVE 04/26/2023 1132   LEUKOCYTESUR NEGATIVE 04/26/2023 1132   Sepsis Labs: @LABRCNTIP (procalcitonin:4,lacticidven:4)  )No results found for this or any previous visit (from the past 240 hour(s)).    Studies: No results found.  Scheduled Meds:  carbamazepine  200 mg Oral BID   fluticasone furoate-vilanterol  1 puff Inhalation Daily   And   umeclidinium bromide  1 puff Inhalation Daily   folic acid  1 mg Oral Daily   multivitamin with minerals  1 tablet Oral Daily   pantoprazole (PROTONIX) IV  40 mg Intravenous Q12H   rosuvastatin  20 mg Oral Daily   thiamine  100 mg Oral Daily   Or   thiamine  100 mg Intravenous Daily    Continuous Infusions:  sodium chloride 10 mL/hr at 04/28/23 1406     LOS: 0 days     Darlin Drop, MD [facility] Pager [phone]  If 7PM-7AM, please contact night-coverage [email or web address] Password  [email or web address] 04/28/2023, 2:20 PM

## 2023-04-28 NOTE — Interval H&P Note (Signed)
History and Physical Interval Note:  04/28/2023 3:18 PM  Jose [name].  has presented today for surgery, with the diagnosis of melena, anemia.  The various methods of treatment have been discussed with the patient and family. After consideration of risks, benefits and other options for treatment, the patient has consented to  Procedure(s): COLONOSCOPY (N/A) as a surgical intervention.  The patient's history has been reviewed, patient examined, no change in status, stable for surgery.  I have reviewed the patient's chart and labs.  Questions were answered to the patient's satisfaction.     [place], [place]

## 2023-04-28 NOTE — Op Note (Signed)
[name]enter[facility] Patient Name: Jose [name] Procedure Date: 04/28/2023 2:09 PM MRN: [id number] Account #: [id number] Date of Birth: 03-22-1971 Admit Type: Inpatient Age: 52 Room: [facility] ENDO ROOM 1 Gender: Male Note Status: Finalized Instrument Name: Prentice Docker [id number] Procedure:             Colonoscopy Indications:           Melena, Acute post hemorrhagic anemia Providers:             Boykin Nearing. Norma Fredrickson MD, MD Referring MD:          [name] (Referring MD) Medicines:             Propofol per Anesthesia Complications:         No immediate complications. Procedure:             Pre-Anesthesia Assessment:                        - The risks and benefits of the procedure and the                         sedation options and risks were discussed with the                         patient. All questions were answered and informed                         consent was obtained.                        - Patient identification and proposed procedure were                         verified prior to the procedure by the nurse. The                         procedure was verified in the procedure room.                        - ASA Grade Assessment: III - A patient with severe                         systemic disease.                        - After reviewing the risks and benefits, the patient                         was deemed in satisfactory condition to undergo the                         procedure.                        After obtaining informed consent, the colonoscope was                         passed under direct vision. Throughout the procedure,                         the patient's  blood pressure, pulse, and oxygen                         saturations were monitored continuously. The                         Colonoscope was introduced through the anus and                         advanced to the the cecum, identified by appendiceal                         orifice  and ileocecal valve. The colonoscopy was                         performed without difficulty. The patient tolerated                         the procedure well. The quality of the bowel                         preparation was adequate. The ileocecal valve,                         appendiceal orifice, and rectum were photographed. Findings:      The perianal and digital rectal examinations were normal. Pertinent       negatives include normal sphincter tone and no palpable rectal lesions.      Non-bleeding internal hemorrhoids were found during retroflexion. The       hemorrhoids were Grade I (internal hemorrhoids that do not prolapse).      Many medium-mouthed and small-mouthed diverticula were found in the left       colon.      There is no endoscopic evidence of bleeding, mass or polyps in the       entire colon. Impression:            - Non-bleeding internal hemorrhoids.                        - Diverticulosis in the left colon.                        - No specimens collected. Recommendation:        - Patient has a contact number available for                         emergencies. The signs and symptoms of potential                         delayed complications were discussed with the patient.                         Return to normal activities tomorrow. Written                         discharge instructions were provided to the patient.                        - Resume previous diet.                        -  Continue present medications.                        - Return patient to hospital ward for possible                         discharge same day.                        - The findings and recommendations were discussed with                         the patient. Procedure Code(s):     --- Professional ---                        [phone], Colonoscopy, flexible; diagnostic, including                         collection of specimen(s) by brushing or washing, when                          performed (separate procedure) Diagnosis Code(s):     --- Professional ---                        K57.30, Diverticulosis of large intestine without                         perforation or abscess without bleeding                        D62, Acute posthemorrhagic anemia                        K92.1, Melena (includes Hematochezia)                        K64.0, First degree hemorrhoids CPT copyright 2022 American Medical Association. All rights reserved. The codes documented in this report are preliminary and upon coder review may  be revised to meet current compliance requirements. Stanton Kidney MD, MD 04/28/2023 3:47:30 PM This report has been signed electronically. Number of Addenda: 0 Note Initiated On: 04/28/2023 2:09 PM Scope Withdrawal Time: 0 hours 5 minutes 11 seconds  Total Procedure Duration: 0 hours 14 minutes 56 seconds  Estimated Blood Loss:  Estimated blood loss: none.      [facility]

## 2023-04-28 NOTE — Progress Notes (Signed)
Jose [name]. to be discharged Home per MD order. Discussed prescriptions and follow up appointments with the patient. Prescriptions given to patient, medication list explained in detail. Patient verbalized understanding.  Allergies as of 04/28/2023   No Known Allergies      Medication List     STOP taking these medications    diltiazem 360 MG 24 hr capsule Commonly known as: CARDIZEM CD   sacubitril-valsartan 49-51 MG Commonly known as: ENTRESTO       TAKE these medications    albuterol 108 (90 Base) MCG/ACT inhaler Commonly known as: VENTOLIN HFA Inhale 2 puffs into the lungs every 6 (six) hours as needed for wheezing or shortness of breath.   carbamazepine 200 MG tablet Commonly known as: TEGRETOL Take 1 tablet (200 mg total) by mouth 2 (two) times daily.   empagliflozin 10 MG Tabs tablet Commonly known as: Jardiance Take 1 tablet (10 mg total) by mouth daily before breakfast.   folic acid 1 MG tablet Commonly known as: FOLVITE Take 1 tablet (1 mg total) by mouth daily. Start taking on: April 29, 2023   furosemide 40 MG tablet Commonly known as: LASIX Take 1 tablet (40 mg total) by mouth every morning.   Mag-Oxide 200 MG Tabs Generic drug: Magnesium Oxide -Mg Supplement Take 1 tablet (200 mg total) by mouth 2 (two) times daily.   metoprolol tartrate 25 MG tablet Commonly known as: LOPRESSOR TAKE 3 TABLETS BY MOUTH TWICE DAILY   multivitamin with minerals Tabs tablet Take 1 tablet by mouth daily. Start taking on: April 29, 2023   potassium chloride 10 MEQ tablet Commonly known as: KLOR-CON Take 20 mEq by mouth daily.   rosuvastatin 20 MG tablet Commonly known as: Crestor Take 1 tablet (20 mg total) by mouth daily.   spironolactone 25 MG tablet Commonly known as: ALDACTONE Take 1 tablet (25 mg total) by mouth daily.   SV Iron 325 (65 FE) MG tablet Generic drug: ferrous sulfate Take 1 tablet by mouth once daily   thiamine 100 MG tablet Commonly  known as: Vitamin B-1 Take 1 tablet (100 mg total) by mouth daily. Start taking on: April 29, 2023   Trelegy Ellipta 100-62.5-25 MCG/ACT Aepb Generic drug: Fluticasone-Umeclidin-Vilant Inhale 1 puff into the lungs daily.   warfarin 5 MG tablet Commonly known as: COUMADIN Take as directed. If you are unsure how to take this medication, talk to your nurse or doctor. Original instructions: Take 1 tablet (5 mg total) by mouth as directed.        Vitals:   04/28/23 1604 04/28/23 1643  BP: 100/74 123/89  Pulse: 82 79  Resp: 20   Temp:  98.2 F (36.8 C)  SpO2: 98% 100%    Skin clean, dry and intact without evidence of skin break down and or skin tears. IV catheter discontinued intact. Site without signs and symptoms of complications. Dressing and pressure applied. Patient denies pain at this time. No complaints noted.  An After Visit Summary was printed and given to the patient. Patient escorted via wheelchair and discharged Home home via private auto.  Madie Reno, RN

## 2023-04-28 NOTE — Transfer of Care (Signed)
Immediate Anesthesia Transfer of Care Note  Patient: Jose [name].  Procedure(s) Performed: COLONOSCOPY  Patient Location: PACU  Anesthesia Type:General  Level of Consciousness: drowsy and patient cooperative  Airway & Oxygen Therapy: Patient Spontanous Breathing and Patient connected to face mask oxygen  Post-op Assessment: Report given to RN and Post -op Vital signs reviewed and stable  Post vital signs: Reviewed and stable  Last Vitals:  Vitals Value Taken Time  BP 92/42 04/28/23 1544  Temp    Pulse 86 04/28/23 1545  Resp 26 04/28/23 1545  SpO2 98 % 04/28/23 1545  Vitals shown include unvalidated device data.  Last Pain:  Vitals:   04/28/23 1443  TempSrc: Temporal  PainSc: 0-No pain         Complications: No notable events documented.

## 2023-04-28 NOTE — Anesthesia Postprocedure Evaluation (Signed)
Anesthesia Post Note  Patient: Remon Quinto.  Procedure(s) Performed: COLONOSCOPY  Patient location during evaluation: Endoscopy Anesthesia Type: General Level of consciousness: awake and alert Pain management: pain level controlled Vital Signs Assessment: post-procedure vital signs reviewed and stable Respiratory status: spontaneous breathing, nonlabored ventilation, respiratory function stable and patient connected to nasal cannula oxygen Cardiovascular status: blood pressure returned to baseline and stable Postop Assessment: no apparent nausea or vomiting Anesthetic complications: no   No notable events documented.   Last Vitals:  Vitals:   04/28/23 1544 04/28/23 1554  BP: (!) 92/42 (!) 90/56  Pulse: 87 73  Resp: (!) 25 16  Temp: 36.8 C   SpO2: 99% 100%    Last Pain:  Vitals:   04/28/23 1544  TempSrc: Temporal  PainSc: Asleep                 Corinda Gubler

## 2023-04-28 NOTE — TOC Transition Note (Signed)
Transition of Care [facility]) - CM/SW Discharge Note   Patient Details  Name: Jose [name]. MRN: [id number] Date of Birth: May 10, 1971  [facility] CM/SW Contact:  Kreg Shropshire, RN Phone Number: 04/28/2023, 4:08 PM   Clinical Narrative:     Pt declined substance abuse education. No other toc needs at this time. TOC sign off        Patient Goals and CMS Choice      Discharge Placement                         Discharge Plan and Services Additional resources added to the After Visit Summary for                                       Social Determinants of Health (SDOH) Interventions SDOH Screenings   Food Insecurity: No Food Insecurity (04/26/2023)  Housing: Low Risk  (04/26/2023)  Transportation Needs: No Transportation Needs (04/26/2023)  Utilities: Not At Risk (04/26/2023)  Depression (PHQ2-9): Medium Risk (04/25/2023)  Financial Resource Strain: Low Risk  (07/01/2019)  Physical Activity: Sufficiently Active (07/01/2019)  Social Connections: Moderately Isolated (07/01/2019)  Stress: No Stress Concern Present (09/02/2019)  Tobacco Use: High Risk (04/28/2023)     Readmission Risk Interventions     No data to display

## 2023-04-28 NOTE — Anesthesia Preprocedure Evaluation (Signed)
Anesthesia Evaluation  Patient identified by MRN, date of birth, ID band Patient awake    Reviewed: Allergy & Precautions, NPO status , Patient's Chart, lab work & pertinent test results  History of Anesthesia Complications Negative for: history of anesthetic complications  Airway Mallampati: III  TM Distance: >3 FB Neck ROM: full    Dental  (+) Missing   Pulmonary COPD, Current Smoker and Patient abstained from smoking.   Pulmonary exam normal        Cardiovascular Exercise Tolerance: Good hypertension, (-) angina +CHF  Normal cardiovascular exam     Neuro/Psych Seizures -,   negative psych ROS   GI/Hepatic negative GI ROS, Neg liver ROS,,,  Endo/Other  negative endocrine ROS    Renal/GU Renal disease  negative genitourinary   Musculoskeletal   Abdominal   Peds  Hematology negative hematology ROS (+)   Anesthesia Other Findings Past Medical History: No date: Alcohol abuse No date: Cervicalgia No date: CHF (congestive heart failure) (HCC) No date: COPD (chronic obstructive pulmonary disease) (HCC) No date: Hyperlipidemia No date: Hypertension No date: Seizures [facility])  Past Surgical History: 01/12/2023: CARDIOVERSION; N/A     Comment:  Procedure: CARDIOVERSION;  Surgeon: Laurey Morale,               MD;  Location: [facility] ORS;  Service: Cardiovascular;                Laterality: N/A; No date: CHOLECYSTECTOMY 01/16/2018: COLONOSCOPY WITH PROPOFOL; N/A     Comment:  Procedure: COLONOSCOPY WITH PROPOFOL;  Surgeon: Toney Reil, MD;  Location: [facility] ENDOSCOPY;  Service:               Gastroenterology;  Laterality: N/A; 01/16/2018: ESOPHAGOGASTRODUODENOSCOPY (EGD) WITH PROPOFOL; N/A     Comment:  Procedure: ESOPHAGOGASTRODUODENOSCOPY (EGD) WITH               PROPOFOL;  Surgeon: Toney Reil, MD;  Location:               [facility] ENDOSCOPY;  Service: Gastroenterology;  Laterality:                N/A; 04/27/2023: ESOPHAGOGASTRODUODENOSCOPY (EGD) WITH PROPOFOL; N/A     Comment:  Procedure: ESOPHAGOGASTRODUODENOSCOPY (EGD) WITH               PROPOFOL;  Surgeon: [place], Boykin Nearing, MD;  Location:               [facility] ENDOSCOPY;  Service: Gastroenterology;  Laterality:               N/A; 04/27/2023: HOT HEMOSTASIS     Comment:  Procedure: HOT HEMOSTASIS (ARGON PLASMA               COAGULATION/BICAP);  Surgeon: Norma Fredrickson, Boykin Nearing, MD;                Location: [facility] ENDOSCOPY;  Service: Gastroenterology;; 01/12/2023: TEE WITHOUT CARDIOVERSION; N/A     Comment:  Procedure: TRANSESOPHAGEAL ECHOCARDIOGRAM (TEE);                Surgeon: Laurey Morale, MD;  Location: [facility] ORS;                Service: Cardiovascular;  Laterality: N/A; No date: TONSILLECTOMY  BMI    Body Mass Index: 27.66 kg/m      Reproductive/Obstetrics negative OB ROS  Anesthesia Physical Anesthesia Plan  ASA: 3  Anesthesia Plan: General   Post-op Pain Management:    Induction: Intravenous  PONV Risk Score and Plan: Propofol infusion and TIVA  Airway Management Planned: Natural Airway and Nasal Cannula  Additional Equipment:   Intra-op Plan:   Post-operative Plan:   Informed Consent: I have reviewed the patients History and Physical, chart, labs and discussed the procedure including the risks, benefits and alternatives for the proposed anesthesia with the patient or authorized representative who has indicated his/her understanding and acceptance.     Dental Advisory Given  Plan Discussed with: Anesthesiologist, CRNA and Surgeon  Anesthesia Plan Comments: (Patient consented for risks of anesthesia including but not limited to:  - adverse reactions to medications - risk of airway placement if required - damage to eyes, teeth, lips or other oral mucosa - nerve damage due to positioning  - sore throat or hoarseness - Damage to heart, brain, nerves,  lungs, other parts of body or loss of life  Patient voiced understanding.)       Anesthesia Quick Evaluation

## 2023-05-01 ENCOUNTER — Telehealth: Payer: Self-pay | Admitting: *Deleted

## 2023-05-01 ENCOUNTER — Encounter: Payer: Self-pay | Admitting: Cardiology

## 2023-05-01 ENCOUNTER — Telehealth: Payer: Self-pay

## 2023-05-01 ENCOUNTER — Other Ambulatory Visit
Admission: RE | Admit: 2023-05-01 | Discharge: 2023-05-01 | Disposition: A | Discharge status: Home / Self Care | Payer: BC Managed Care – PPO | Source: Ambulatory Visit | Attending: Cardiology | Admitting: Cardiology

## 2023-05-01 ENCOUNTER — Ambulatory Visit (HOSPITAL_BASED_OUTPATIENT_CLINIC_OR_DEPARTMENT_OTHER): Payer: BC Managed Care – PPO | Admitting: Cardiology

## 2023-05-01 ENCOUNTER — Telehealth: Payer: Self-pay | Admitting: Pharmacist Clinician (PhC)/ Clinical Pharmacy Specialist

## 2023-05-01 VITALS — BP 85/57 | HR 65 | Wt 208.6 lb

## 2023-05-01 DIAGNOSIS — I5032 Chronic diastolic (congestive) heart failure: Secondary | ICD-10-CM | POA: Diagnosis not present

## 2023-05-01 DIAGNOSIS — J449 Chronic obstructive pulmonary disease, unspecified: Secondary | ICD-10-CM | POA: Diagnosis not present

## 2023-05-01 DIAGNOSIS — I4819 Other persistent atrial fibrillation: Secondary | ICD-10-CM | POA: Diagnosis not present

## 2023-05-01 LAB — BASIC METABOLIC PANEL
Anion gap: 9 (ref 5–15)
BUN: 39 mg/dL — ABNORMAL HIGH (ref 6–20)
CO2: 20 mmol/L — ABNORMAL LOW (ref 22–32)
Calcium: 9 mg/dL (ref 8.9–10.3)
Chloride: 110 mmol/L (ref 98–111)
Creatinine, Ser: 1.41 mg/dL — ABNORMAL HIGH (ref 0.61–1.24)
GFR, Estimated: 60 mL/min — ABNORMAL LOW (ref >60)
Glucose, Bld: 106 mg/dL — ABNORMAL HIGH (ref 70–99)
Potassium: 4.8 mmol/L (ref 3.5–5.1)
Sodium: 139 mmol/L (ref 135–145)

## 2023-05-01 LAB — CBC
HCT: 25.7 % — ABNORMAL LOW (ref 39.0–52.0)
Hemoglobin: 8 g/dL — ABNORMAL LOW (ref 13.0–17.0)
MCH: 32.4 pg (ref 26.0–34.0)
MCHC: 31.1 g/dL (ref 30.0–36.0)
MCV: 104 fL — ABNORMAL HIGH (ref 80.0–100.0)
Platelets: 128 10*3/uL — ABNORMAL LOW (ref 150–400)
RBC: 2.47 MIL/uL — ABNORMAL LOW (ref 4.22–5.81)
RDW: 16.4 % — ABNORMAL HIGH (ref 11.5–15.5)
WBC: 8.3 10*3/uL (ref 4.0–10.5)
nRBC: 0.4 % — ABNORMAL HIGH (ref 0.0–0.2)

## 2023-05-01 LAB — IRON AND TIBC
Iron: 164 ug/dL (ref 45–182)
Saturation Ratios: 64 % — ABNORMAL HIGH (ref 17.9–39.5)
TIBC: 255 ug/dL (ref 250–450)
UIBC: 91 ug/dL

## 2023-05-01 LAB — FERRITIN: Ferritin: 220 ng/mL (ref 24–336)

## 2023-05-01 MED ORDER — METOPROLOL TARTRATE 25 MG PO TABS
25.0000 mg | ORAL_TABLET | Freq: Two times a day (BID) | ORAL | 3 refills | Status: DC
Start: 2023-05-01 — End: 2024-04-04

## 2023-05-01 MED ORDER — SPIRONOLACTONE 25 MG PO TABS
12.5000 mg | ORAL_TABLET | Freq: Every day | ORAL | 6 refills | Status: DC
Start: 2023-05-01 — End: 2024-03-22

## 2023-05-01 NOTE — Telephone Encounter (Signed)
Caller stated Dr. Jearld Pies wants patient's Warfarin goal to be decreased to between 1.8 and 2.3.

## 2023-05-01 NOTE — Telephone Encounter (Signed)
Lpmtcb and schedule [place] INR check;

## 2023-05-01 NOTE — Patient Instructions (Addendum)
Medication Changes:  Decrease Metoprolol to 25 mg (1 tablet) 2 times a day.  Take Spironolactone to 12.5 mg (0.5 tablet) daily.  Your Diltiazem and Entresto have been removed from your medication list, as you are not continuing this regimen of medication.  Lab Work:  Labs done today, your results will be available in MyChart, we will contact you for abnormal readings.  Decrease your INR goal with Warfarin to 1.8-2.3 for now  Referrals:  You have been referred to Pulmonary for COPD. You will receive a call from their office to schedule this appointment.  Special Instructions // Education:  Do the following things EVERYDAY: Weigh yourself in the morning before breakfast. Write it down and keep it in a log. Take your medicines as prescribed Eat low salt foods--Limit salt (sodium) to 2000 mg per day.  Stay as active as you can everyday Limit all fluids for the day to less than 2 liters   Follow-Up in: 1 month follow up.    If you have any questions or concerns before your next appointment please send [place] a message through [place] or call our office at [phone] Monday-Friday 8 am-5 pm.   If you have an urgent need after hours on the weekend please call your Primary Cardiologist or the [facility] in [place] at [phone].

## 2023-05-01 NOTE — Telephone Encounter (Signed)
Pt will be evaluated in the [facility]ticoagulation [facility] on 05/03/23 and change will be made at taht time.   Per Dr. Tandy Gaw note it states: Continue warfarin for now but decrease INR goal for the time being to 1.8-2.3 (he has not cut back on ETOH).

## 2023-05-01 NOTE — Progress Notes (Signed)
ADVANCED HEART FAILURE CLINIC NOTE  Referring Physician: [name], NP  Primary Care: [name], NP HF Cardiology: Dr. Gasper Lloyd  HPI: Jose [name]. is a 52 y.o. male with hypertension, hyperlipidemia, COPD, significant alcohol abuse, history of seizures, persistent atrial fibrillation.  Patient was admitted in 1/24 with fevers chills cough and fatigue.  He presented hypotensive to 60s/40s with new onset atrial fibrillation with RVR. TTE during admit with LVEF of 50-55%, moderate biatrial enlargement, normal RV. He was seen by neurology for possible alcohol withdrawal related seizures and continued on home tegretol (undetectable levels on admit). For his atrial fibrillation he was discharged home on cardizem & lopressor. Anticoagulation was not started due to concern for continued alcohol abuse and thrombocytopenia.   Patient was ultimately started on warfarin and had TEE-guided DCCV in 3/24.  TEE showed EF 55%, normal RV, mild LAE.    PFTs in 5/24 showed severe COPD. Cardiolite in 5/24 showed no ischemia or infarction.   Patient was admitted in 6/24 with melena and AKI, creatinine up to 2.29 and hgb down to 7.9.  He was transfused.  EGD showed a single AVM, nonbleeding, treated with APC. Colonoscopy showed diverticulosis.  Due to low BP, diltiazem CD and Entresto were stopped.  Warfarin was held while in the hospital but was restarted at discharge.   He returns today for followup of CHF and atrial fibrillation.  He remains in NSR today though he has occasional palpitations.  He still drinks up to 1 pint of liquor/day.  Still smoking 2 cigars/day.  Stable exertional walking about 100 feet (to mailbox).   No chest pain.  He has chronic orthopnea.  He gets sleepy during the day. BP is still running low and he gets lightheaded at times.  He is taking warfarin still but denies further melena or hematochezia (says stool looks normal).   ECG (personally reviewed): NSR, 1st degree AVB,  septal Qs  Labs (1/24): K 3.7, creatinine 0.84, plts 345, hgb 12.3 Labs (2/24): K 4.6, creatinine 1.49, BNP 58 Labs (6/24): K 3.8, creatinine 2.29 => 0.82, hgb 11.4 => 7.9, plts 130  Past Medical History:  Diagnosis Date   Alcohol abuse    Cervicalgia    CHF (congestive heart failure) (HCC)    COPD (chronic obstructive pulmonary disease) (HCC)    Hyperlipidemia    Hypertension    Seizures (HCC)     Current Outpatient Medications  Medication Sig Dispense Refill   albuterol (VENTOLIN HFA) 108 (90 Base) MCG/ACT inhaler Inhale 2 puffs into the lungs every 6 (six) hours as needed for wheezing or shortness of breath. 8 g 2   carbamazepine (TEGRETOL) 200 MG tablet Take 1 tablet (200 mg total) by mouth 2 (two) times daily. 180 tablet 1   empagliflozin (JARDIANCE) 10 MG TABS tablet Take 1 tablet (10 mg total) by mouth daily before breakfast. 90 tablet 1   ferrous sulfate (SV IRON) 325 (65 FE) MG tablet Take 1 tablet by mouth once daily 90 tablet 3   furosemide (LASIX) 40 MG tablet Take 1 tablet (40 mg total) by mouth every morning. 30 tablet 3   Magnesium Oxide -Mg Supplement (MAG-OXIDE) 200 MG TABS Take 1 tablet (200 mg total) by mouth 2 (two) times daily. 180 tablet 1   potassium chloride (KLOR-CON) 10 MEQ tablet Take 20 mEq by mouth daily.     rosuvastatin (CRESTOR) 20 MG tablet Take 1 tablet (20 mg total) by mouth daily. 90 tablet 1  warfarin (COUMADIN) 5 MG tablet Take 1 tablet (5 mg total) by mouth as directed. 30 tablet 3   Fluticasone-Umeclidin-Vilant (TRELEGY ELLIPTA) 100-62.5-25 MCG/ACT AEPB Inhale 1 puff into the lungs daily. (Patient not taking: Reported on 05/01/2023) 14 each 0   folic acid (FOLVITE) 1 MG tablet Take 1 tablet (1 mg total) by mouth daily. (Patient not taking: Reported on 05/01/2023) 90 tablet 0   metoprolol tartrate (LOPRESSOR) 25 MG tablet Take 1 tablet (25 mg total) by mouth 2 (two) times daily. 60 tablet 3   Multiple Vitamin (MULTIVITAMIN WITH MINERALS) TABS  tablet Take 1 tablet by mouth daily. (Patient not taking: Reported on 05/01/2023) 90 tablet 0   spironolactone (ALDACTONE) 25 MG tablet Take 0.5 tablets (12.5 mg total) by mouth daily. 15 tablet 6   thiamine (VITAMIN B-1) 100 MG tablet Take 1 tablet (100 mg total) by mouth daily. (Patient not taking: Reported on 05/01/2023) 90 tablet 0   No current facility-administered medications for this visit.    No Known Allergies    Social History   Socioeconomic History   Marital status: Widowed    Spouse name: Not on file   Number of children: Not on file   Years of education: Not on file   Highest education level: Not on file  Occupational History   Not on file  Tobacco Use   Smoking status: Every Day    Types: Cigars    Start date: 2020   Smokeless tobacco: Former   Tobacco comments:    2 cigars a day-05/05/2022    Quit smoking cigarettes 3 years ago.  Smokes cigarettes since he was 52 years old.  Vaping Use   Vaping Use: Never used  Substance and Sexual Activity   Alcohol use: Yes    Alcohol/week: 28.0 standard drinks of alcohol    Types: 7 Cans of beer, 21 Shots of liquor per week    Comment: pint of liquor per day   Drug use: No    Types: Marijuana    Comment: quit back in the late 90's   Sexual activity: Yes  Other Topics Concern   Not on file  Social History Narrative   Lives with his Mother [name]    Social Determinants of Health   Financial Resource Strain: Low Risk  (07/01/2019)   Overall Financial Resource Strain (CARDIA)    Difficulty of Paying Living Expenses: Not hard at all  Food Insecurity: No Food Insecurity (04/26/2023)   Hunger Vital Sign    Worried About Running Out of Food in the Last Year: Never true    Ran Out of Food in the Last Year: Never true  Transportation Needs: No Transportation Needs (04/26/2023)   PRAPARE - Administrator, Civil Service (Medical): No    Lack of Transportation (Non-Medical): No  Physical Activity: Sufficiently  Active (07/01/2019)   Exercise Vital Sign    Days of Exercise per Week: 3 days    Minutes of Exercise per Session: 50 min  Stress: No Stress Concern Present (09/02/2019)   [employer] of Occupational Health - Occupational Stress Questionnaire    Feeling of Stress : Not at all  Social Connections: Moderately Isolated (07/01/2019)   Social Connection and Isolation Panel [NHANES]    Frequency of Communication with Friends and Family: Three times a week    Frequency of Social Gatherings with Friends and Family: Three times a week    Attends Religious Services: 1 to 4 times per year  Active Member of Clubs or Organizations: No    Attends Banker Meetings: Never    Marital Status: Widowed  Intimate Partner Violence: Not At Risk (04/26/2023)   Humiliation, Afraid, Rape, and Kick questionnaire    Fear of Current or Ex-Partner: No    Emotionally Abused: No    Physically Abused: No    Sexually Abused: No      Family History  Problem Relation Age of Onset   Hypertension Mother    Diabetes Mother    Colon cancer Mother    Hypertension Father    Multiple sclerosis Father     PHYSICAL EXAM: Vitals:   05/01/23 1019  BP: (!) 85/57  Pulse: 65  SpO2: 96%   General: NAD Neck: JVP 7-8 cm, no thyromegaly or thyroid nodule.  Lungs: Clear to auscultation bilaterally with normal respiratory effort. CV: Nondisplaced PMI.  Heart regular S1/S2, no S3/S4, no murmur.  No peripheral edema.  No carotid bruit.  Normal pedal pulses.  Abdomen: Soft, nontender, no hepatosplenomegaly, no distention.  Skin: Intact without lesions or rashes.  Neurologic: Alert and oriented x 3.  Psych: Normal affect. Extremities: No clubbing or cyanosis.  HEENT: Normal.   ECHO: 11/11/22: LVEF 50-55%, normal RV function 11/05/21: LVEF 60-65%, normal RV function TEE (3/24): EF 55%, RV normal  Cardiolite (5/24): EF 45% but visually looks normal, no ischemia/infarction   ASSESSMENT & PLAN:  1.   Chronic diastolic CHF: Echo in 1/24 with EF 50-55%, normal RV in setting of atrial fibrillation with RVR.  TEE in 3/24 with EF 55% and normal RV.  Cardiolite in 5/24 with no ischemia/infarction.  He has NYHA class III symptoms still but is in NSR today and is not volume overloaded on exam. I suspect dyspnea is multifactorial with anemia and severe COPD playing a role.   - Continue Lasix 40 mg daily, would not increase.  BMET/BNP today.  - Continue Jardiance 10 mg daily.  - Off Entresto with low BP.   - Continue spironolactone 12.5 daily.  2. Atrial fibrillation: Paroxysmal.  He was not started on anticoagulation during 1/24 admission due to ETOH abuse, thrombocytopenia, and concerns about fall risk and ability to take anticoagulation consistently. However, anticoagulation was later started and he was cardioverted in 3/24.  He has not had any falls.  Unfortunately, he is on Tegretol so cannot take Eliquis, Xarelto, or Pradaxa.  GFR is too high for edoxaban.  He is, therefore, on warfarin.  He is in NSR today.  Unfortunately, he had melena/upper GI bleeding in 6/24.  Warfarin was stopped in the hospital but he restarted after discharge.  - He denies melena or hematochezia today, so I will let him continue warfarin but will decrease INR goal for now to 1.8-2.3.  Will discuss with coumadin clinic.  If CBC today is still significantly low, may hold warfarin altogether for the time being.  - Off diltiazem due to low BP.  - With low BP today, decrease metoprolol to 25 mg bid (HR in 60s).  - Still needs to cut back on ETOH.  - Still needs sleep study.  - If he can cut back more on ETOH, would refer to EP for AF ablation.    3. Alcohol abuse: Long history of alcohol abuse. He has not quit drinking, still up to 1 pint liquor/day.  - Discussed imperative to continue cutting back ETOH.  4. Thrombocytopenia: ?Related to ETOH abuse.  Last CBC with plts up to 130K.  -  Repeat CBC.  5. GI bleeding: Episode of  suspected upper GI bleeding in 6/24 with melena.  EGD showed AVM that was non-bleeding, he had APC.  Colonoscopy showed diverticulosis.  Bleeding attributed to ETOH abuse.  Warfarin was stopped in hospital but he restarted after discharge.  He denies melena/BRBPR currently.  - CBC today.  - Will check Fe studies, replace with IV Fe if low.  - Continue warfarin for now but decrease INR goal for the time being to 1.8-2.3 (he has not cut back on ETOH).  6. COPD: Severe obstruction on PFTs in 5/24, still smoking cigars. Suspect this contributes to his dyspnea.  - I urged him to quit.  - Refer to pulmonology for evaluation.   Followup in 1 month.   Macy Lingenfelter 05/01/2023

## 2023-05-01 NOTE — Progress Notes (Signed)
Spoke with pt's mother.  Pt and mother aware, agreeable, and verbalized understanding

## 2023-05-01 NOTE — Telephone Encounter (Signed)
Spoke with pt's mom and scheduled an appt for the pt on Wednesday 05/03/23 at 1045am.

## 2023-05-01 NOTE — Progress Notes (Signed)
Attempt to call pt regarding lab results and repeat lab work. No answer at this time.lvm with call back number. Will attempt to call back at a later time.

## 2023-05-01 NOTE — Addendum Note (Signed)
Addended by: Electa Sniff on: 05/01/2023 11:38 AM   Modules accepted: Orders

## 2023-05-03 ENCOUNTER — Ambulatory Visit: Payer: BC Managed Care – PPO | Attending: Internal Medicine

## 2023-05-03 DIAGNOSIS — I4819 Other persistent atrial fibrillation: Secondary | ICD-10-CM

## 2023-05-03 DIAGNOSIS — Z7901 Long term (current) use of anticoagulants: Secondary | ICD-10-CM

## 2023-05-03 LAB — POCT INR: INR: 1.6 — AB (ref 2.0–3.0)

## 2023-05-03 NOTE — Patient Instructions (Signed)
TAKE 1.5 TABLETS TODAY ONLY THEN Start 1 tablet Daily.   Recheck in 2 weeks. Keep your intake of greens consistent (2 HELPINGS OF GREENS PER WEEK) Please call the Coumadin Clinic with any questions at [phone]

## 2023-05-12 ENCOUNTER — Other Ambulatory Visit
Admission: RE | Admit: 2023-05-12 | Discharge: 2023-05-12 | Disposition: A | Discharge status: Home / Self Care | Payer: BC Managed Care – PPO | Source: Ambulatory Visit | Attending: Cardiology | Admitting: Cardiology

## 2023-05-12 DIAGNOSIS — I5032 Chronic diastolic (congestive) heart failure: Secondary | ICD-10-CM | POA: Diagnosis not present

## 2023-05-12 LAB — CBC
HCT: 27.6 % — ABNORMAL LOW (ref 39.0–52.0)
Hemoglobin: 8.9 g/dL — ABNORMAL LOW (ref 13.0–17.0)
MCH: 33.5 pg (ref 26.0–34.0)
MCHC: 32.2 g/dL (ref 30.0–36.0)
MCV: 103.8 fL — ABNORMAL HIGH (ref 80.0–100.0)
Platelets: 189 10*3/uL (ref 150–400)
RBC: 2.66 MIL/uL — ABNORMAL LOW (ref 4.22–5.81)
RDW: 18 % — ABNORMAL HIGH (ref 11.5–15.5)
WBC: 7.3 10*3/uL (ref 4.0–10.5)
nRBC: 2.7 % — ABNORMAL HIGH (ref 0.0–0.2)

## 2023-05-17 ENCOUNTER — Ambulatory Visit: Payer: BC Managed Care – PPO | Attending: Internal Medicine

## 2023-05-17 DIAGNOSIS — Z7901 Long term (current) use of anticoagulants: Secondary | ICD-10-CM | POA: Diagnosis not present

## 2023-05-17 DIAGNOSIS — I4819 Other persistent atrial fibrillation: Secondary | ICD-10-CM

## 2023-05-17 DIAGNOSIS — I5031 Acute diastolic (congestive) heart failure: Secondary | ICD-10-CM | POA: Diagnosis not present

## 2023-05-17 DIAGNOSIS — J449 Chronic obstructive pulmonary disease, unspecified: Secondary | ICD-10-CM | POA: Diagnosis not present

## 2023-05-17 DIAGNOSIS — F1020 Alcohol dependence, uncomplicated: Secondary | ICD-10-CM | POA: Diagnosis not present

## 2023-05-17 DIAGNOSIS — G40909 Epilepsy, unspecified, not intractable, without status epilepticus: Secondary | ICD-10-CM | POA: Diagnosis not present

## 2023-05-17 LAB — POCT INR: INR: 2.2 (ref 2.0–3.0)

## 2023-05-17 NOTE — Patient Instructions (Signed)
Continue 1 tablet Daily.   Recheck in 3 weeks. Keep your intake of greens consistent (2 HELPINGS OF GREENS PER WEEK) Please call the Coumadin Clinic with any questions at [phone]

## 2023-05-27 DIAGNOSIS — D500 Iron deficiency anemia secondary to blood loss (chronic): Secondary | ICD-10-CM | POA: Diagnosis not present

## 2023-05-27 DIAGNOSIS — I5032 Chronic diastolic (congestive) heart failure: Secondary | ICD-10-CM | POA: Diagnosis not present

## 2023-05-27 DIAGNOSIS — F10920 Alcohol use, unspecified with intoxication, uncomplicated: Secondary | ICD-10-CM | POA: Diagnosis not present

## 2023-05-27 DIAGNOSIS — I509 Heart failure, unspecified: Secondary | ICD-10-CM | POA: Diagnosis not present

## 2023-06-02 ENCOUNTER — Other Ambulatory Visit: Payer: Self-pay | Admitting: Nurse Practitioner

## 2023-06-02 DIAGNOSIS — I4819 Other persistent atrial fibrillation: Secondary | ICD-10-CM

## 2023-06-02 NOTE — Telephone Encounter (Signed)
Unable to refill per protocol, Rx expired. Dose change. Requested Prescriptions  Pending Prescriptions Disp Refills   MAGNESIUM-OXIDE 400 (240 Mg) MG tablet [Pharmacy Med Name: MAGnesium-Oxide 400 (241.3 Mg) MG Oral Tablet] 90 tablet 0    Sig: Take 1/2 (one-half) tablet by mouth twice daily     Endocrinology:  Minerals - Magnesium Supplementation Failed - 06/02/2023 10:36 AM      Failed - Cr in normal range and within 360 days    Creatinine, Ser  Date Value Ref Range Status  05/01/2023 1.41 (H) 0.61 - 1.24 mg/dL Final   Creatinine, Urine  Date Value Ref Range Status  11/11/2022 80 mg/dL Final    Comment:    Performed at [facility], [address], [place], [place] [address]         Passed - Mg Level in normal range and within 360 days    Magnesium  Date Value Ref Range Status  04/26/2023 2.1 1.7 - 2.4 mg/dL Final    Comment:    Performed at [facility], [address] [place], [place] [address]         Passed - Valid encounter within last 12 months    Recent Outpatient Visits           1 month ago Persistent atrial fibrillation [facility])   [place] [facility] [name], NP   3 months ago Chronic heart failure with preserved ejection fraction [facility])   [place] [facility] [name], NP   5 months ago Hypomagnesemia   [facility] [name], NP   6 months ago Hospital discharge follow-up   [place] [facility] [name], NP   9 months ago Primary hypertension   [facility] [name], NP       Future Appointments             In 1 month Mecum, Oswaldo Conroy, PA-C [place] [facility]

## 2023-06-07 ENCOUNTER — Ambulatory Visit: Payer: BC Managed Care – PPO | Attending: Internal Medicine

## 2023-06-07 DIAGNOSIS — Z7901 Long term (current) use of anticoagulants: Secondary | ICD-10-CM | POA: Diagnosis not present

## 2023-06-07 DIAGNOSIS — I4819 Other persistent atrial fibrillation: Secondary | ICD-10-CM | POA: Diagnosis not present

## 2023-06-07 LAB — POCT INR: INR: 1.1 — AB (ref 2.0–3.0)

## 2023-06-07 NOTE — Patient Instructions (Signed)
TAKE 2 TABLETS TODAY and 1.5 tablets tomorrow ONLY THEN Continue 1 tablet Daily.   Recheck in 3 weeks. Keep your intake of greens consistent (2 HELPINGS OF GREENS PER WEEK) Please call the Coumadin Clinic with any questions at [phone]

## 2023-06-08 ENCOUNTER — Encounter: Payer: BC Managed Care – PPO | Admitting: Cardiology

## 2023-06-14 ENCOUNTER — Ambulatory Visit: Payer: BC Managed Care – PPO | Attending: Cardiology | Admitting: Cardiology

## 2023-06-14 VITALS — BP 151/101 | HR 69 | Ht 70.0 in | Wt 212.0 lb

## 2023-06-14 DIAGNOSIS — I10 Essential (primary) hypertension: Secondary | ICD-10-CM

## 2023-06-14 DIAGNOSIS — J449 Chronic obstructive pulmonary disease, unspecified: Secondary | ICD-10-CM | POA: Diagnosis not present

## 2023-06-14 DIAGNOSIS — I5032 Chronic diastolic (congestive) heart failure: Secondary | ICD-10-CM

## 2023-06-14 MED ORDER — LOSARTAN POTASSIUM 25 MG PO TABS: 12.5000 mg | ORAL_TABLET | Freq: Every day | ORAL | 3 refills | Status: DC

## 2023-06-14 NOTE — Patient Instructions (Signed)
Medication Changes:  Start Losartan 12.5 mg (0.5 tablet) daily at bedtime.  Lab Work:  Labs done today, your results will be available in MyChart, we will contact you for abnormal readings.     Special Instructions // Education:  Do the following things EVERYDAY: Weigh yourself in the morning before breakfast. Write it down and keep it in a log. Take your medicines as prescribed Eat low salt foods--Limit salt (sodium) to 2000 mg per day.  Stay as active as you can everyday Limit all fluids for the day to less than 2 liters   Follow-Up in: please call clinic in November to schedule your follow up appointment.     If you have any questions or concerns before your next appointment please send [place] a message through [place] or call our office at [phone] Monday-Friday 8 am-5 pm.   If you have an urgent need after hours on the weekend please call your Primary Cardiologist or the Advanced Heart Failure Clinic in [place] at [phone].

## 2023-06-14 NOTE — Progress Notes (Signed)
ADVANCED HEART FAILURE CLINIC NOTE  Referring Physician: [name], NP  Primary Care: [name], NP HF Cardiology: Dr. Gasper Lloyd  HPI: Jose [name]. is a 52 y.o. male with hypertension, hyperlipidemia, COPD, significant alcohol abuse, history of seizures, persistent atrial fibrillation.  Patient was admitted in 1/24 with fevers chills cough and fatigue.  He presented hypotensive to 60s/40s with new onset atrial fibrillation with RVR. TTE during admit with LVEF of 50-55%, moderate biatrial enlargement, normal RV. He was seen by neurology for possible alcohol withdrawal related seizures and continued on home tegretol (undetectable levels on admit). For his atrial fibrillation he was discharged home on cardizem & lopressor. Anticoagulation was not started due to concern for continued alcohol abuse and thrombocytopenia.   Patient was ultimately started on warfarin and had TEE-guided DCCV in 3/24.  TEE showed EF 55%, normal RV, mild LAE.    PFTs in 5/24 showed severe COPD. Cardiolite in 5/24 showed no ischemia or infarction.   Patient was admitted in 6/24 with melena and AKI, creatinine up to 2.29 and hgb down to 7.9.  He was transfused.  EGD showed a single AVM, nonbleeding, treated with APC. Colonoscopy showed diverticulosis.  Due to low BP, diltiazem CD and Entresto were stopped.  Warfarin was held while in the hospital but was restarted at discharge.   He returns today for followup of CHF and atrial fibrillation.  He continues to smoke daily. He reports reducing his alcohol intake to 1/2 pint daily. From a functional standpoint, he has to stop due to dyspnea and fatigue after walking 25-30ft. He is doing better with LE edema and management of volume status.   ECG (personally reviewed): NSR, 1st degree AVB, septal Qs  Labs (1/24): K 3.7, creatinine 0.84, plts 345, hgb 12.3 Labs (2/24): K 4.6, creatinine 1.49, BNP 58 Labs (6/24): K 3.8, creatinine 2.29 => 0.82, hgb 11.4 => 7.9,  plts 130  Past Medical History:  Diagnosis Date   Alcohol abuse    Cervicalgia    CHF (congestive heart failure) (HCC)    COPD (chronic obstructive pulmonary disease) (HCC)    Hyperlipidemia    Hypertension    Seizures (HCC)     Current Outpatient Medications  Medication Sig Dispense Refill   albuterol (VENTOLIN HFA) 108 (90 Base) MCG/ACT inhaler Inhale 2 puffs into the lungs every 6 (six) hours as needed for wheezing or shortness of breath. 8 g 2   carbamazepine (TEGRETOL) 200 MG tablet Take 1 tablet (200 mg total) by mouth 2 (two) times daily. 180 tablet 1   empagliflozin (JARDIANCE) 10 MG TABS tablet Take 1 tablet (10 mg total) by mouth daily before breakfast. 90 tablet 1   ferrous sulfate (SV IRON) 325 (65 FE) MG tablet Take 1 tablet by mouth once daily 90 tablet 3   furosemide (LASIX) 40 MG tablet Take 1 tablet (40 mg total) by mouth every morning. 30 tablet 3   losartan (COZAAR) 25 MG tablet Take 0.5 tablets (12.5 mg total) by mouth at bedtime. 45 tablet 3   Magnesium Oxide -Mg Supplement (MAG-OXIDE) 200 MG TABS Take 1 tablet (200 mg total) by mouth 2 (two) times daily. 180 tablet 1   metoprolol tartrate (LOPRESSOR) 25 MG tablet Take 1 tablet (25 mg total) by mouth 2 (two) times daily. 60 tablet 3   potassium chloride (KLOR-CON) 10 MEQ tablet Take 20 mEq by mouth daily.     rosuvastatin (CRESTOR) 20 MG tablet Take 1 tablet (20 mg total)  by mouth daily. 90 tablet 1   spironolactone (ALDACTONE) 25 MG tablet Take 0.5 tablets (12.5 mg total) by mouth daily. 15 tablet 6   warfarin (COUMADIN) 5 MG tablet Take 1 tablet (5 mg total) by mouth as directed. 30 tablet 3   Fluticasone-Umeclidin-Vilant (TRELEGY ELLIPTA) 100-62.5-25 MCG/ACT AEPB Inhale 1 puff into the lungs daily. (Patient not taking: Reported on 05/01/2023) 14 each 0   folic acid (FOLVITE) 1 MG tablet Take 1 tablet (1 mg total) by mouth daily. (Patient not taking: Reported on 05/01/2023) 90 tablet 0   Multiple Vitamin  (MULTIVITAMIN WITH MINERALS) TABS tablet Take 1 tablet by mouth daily. (Patient not taking: Reported on 05/01/2023) 90 tablet 0   thiamine (VITAMIN B-1) 100 MG tablet Take 1 tablet (100 mg total) by mouth daily. (Patient not taking: Reported on 05/01/2023) 90 tablet 0   No current facility-administered medications for this visit.    No Known Allergies    Social History   Socioeconomic History   Marital status: Widowed    Spouse name: Not on file   Number of children: Not on file   Years of education: Not on file   Highest education level: Not on file  Occupational History   Not on file  Tobacco Use   Smoking status: Every Day    Types: Cigars    Start date: 2020   Smokeless tobacco: Former   Tobacco comments:    2 cigars a day-05/05/2022    Quit smoking cigarettes 3 years ago.  Smokes cigarettes since he was 52 years old.  Vaping Use   Vaping status: Never Used  Substance and Sexual Activity   Alcohol use: Yes    Alcohol/week: 28.0 standard drinks of alcohol    Types: 7 Cans of beer, 21 Shots of liquor per week    Comment: pint of liquor per day   Drug use: No    Types: Marijuana    Comment: quit back in the late 90's   Sexual activity: Yes  Other Topics Concern   Not on file  Social History Narrative   Lives with his Mother [name]    Social Determinants of Health   Financial Resource Strain: Low Risk  (07/01/2019)   Overall Financial Resource Strain (CARDIA)    Difficulty of Paying Living Expenses: Not hard at all  Food Insecurity: No Food Insecurity (04/26/2023)   Hunger Vital Sign    Worried About Running Out of Food in the Last Year: Never true    Ran Out of Food in the Last Year: Never true  Transportation Needs: No Transportation Needs (04/26/2023)   PRAPARE - Administrator, Civil Service (Medical): No    Lack of Transportation (Non-Medical): No  Physical Activity: Sufficiently Active (07/01/2019)   Exercise Vital Sign    Days of Exercise per  Week: 3 days    Minutes of Exercise per Session: 50 min  Stress: No Stress Concern Present (09/02/2019)   [employer] of Occupational Health - Occupational Stress Questionnaire    Feeling of Stress : Not at all  Social Connections: Moderately Isolated (07/01/2019)   Social Connection and Isolation Panel [NHANES]    Frequency of Communication with Friends and Family: Three times a week    Frequency of Social Gatherings with Friends and Family: Three times a week    Attends Religious Services: 1 to 4 times per year    Active Member of Clubs or Organizations: No    Attends Banker  Meetings: Never    Marital Status: Widowed  Intimate Partner Violence: Not At Risk (04/26/2023)   Humiliation, Afraid, Rape, and Kick questionnaire    Fear of Current or Ex-Partner: No    Emotionally Abused: No    Physically Abused: No    Sexually Abused: No      Family History  Problem Relation Age of Onset   Hypertension Mother    Diabetes Mother    Colon cancer Mother    Hypertension Father    Multiple sclerosis Father     PHYSICAL EXAM: Vitals:   06/14/23 1017 06/14/23 1021  BP: (!) 155/111 (!) 151/101  Pulse: 69   SpO2: 97%    GENERAL: Well nourished, well developed, and in no apparent distress at rest.  HEENT: Negative for arcus senilis or xanthelasma. There is no scleral icterus.  The mucous membranes are pink and moist.   NECK: Supple, No masses. Normal carotid upstrokes without bruits. No masses or thyromegaly.    CHEST: There are no chest wall deformities. There is no chest wall tenderness. Respirations are unlabored.  Lungs- coarse lung sounds; mild inspiratory crackles at RLL CARDIAC:  JVP: 7 cm          Normal rate with regular rhythm. No murmurs, rubs or gallops.  Pulses are 2+ and symmetrical in upper and lower extremities. No edema.  ABDOMEN: Soft, non-tender, non-distended. There are no masses or hepatomegaly. There are normal bowel sounds.  EXTREMITIES: Warm and  well perfused with no cyanosis, clubbing.  LYMPHATIC: No axillary or supraclavicular lymphadenopathy.  NEUROLOGIC: Patient is oriented x3 with no focal or lateralizing neurologic deficits.  PSYCH: Patients affect is appropriate, there is no evidence of anxiety or depression.  SKIN: Warm and dry; no lesions or wounds.    ECHO: 11/11/22: LVEF 50-55%, normal RV function 11/05/21: LVEF 60-65%, normal RV function TEE (3/24): EF 55%, RV normal  Cardiolite (5/24): EF 45% but visually looks normal, no ischemia/infarction   ASSESSMENT & PLAN:  1.  Chronic diastolic CHF: Echo in 1/24 with EF 50-55%, normal RV in setting of atrial fibrillation with RVR.  TEE in 3/24 with EF 55% and normal RV.  Cardiolite in 5/24 with no ischemia/infarction.  He has NYHA class III symptoms still but is in NSR today and is not volume overloaded on exam. I suspect dyspnea is multifactorial with anemia and severe COPD playing a role.   - Continue Lasix 40 mg daily, would not increase.  BMET/BNP today.  - Continue Jardiance 10 mg daily.  - Off Entresto with low BP.  Start losartan 12.5mg  at bedtime. Can transition to [facility] if BP allows.  - Continue spironolactone 12.5 daily.  2. Atrial fibrillation: Paroxysmal.  He was not started on anticoagulation during 1/24 admission due to ETOH abuse, thrombocytopenia, and concerns about fall risk and ability to take anticoagulation consistently. However, anticoagulation was later started and he was cardioverted in 3/24.  He has not had any falls.  Unfortunately, he is on Tegretol so cannot take Eliquis, Xarelto, or Pradaxa.  GFR is too high for edoxaban.  He is, therefore, on warfarin.  He is in NSR today.  Unfortunately, he had melena/upper GI bleeding in 6/24.  Warfarin was stopped in the hospital but he restarted after discharge.  - Continue warfarin but will decrease INR goal for now to 1.8-2.3.  Discussed with coumadin clinic. - Hypertensive today; previously intolerant to any  anti-hypertensives; will start losartan 12.5mg  at bedtime.  - Continue metoprolol 25mg  BID -  Still needs to cut back on ETOH.  - Still needs sleep study.  - If he can cut back more on ETOH, would refer to EP for AF ablation.    3. Alcohol abuse: Long history of alcohol abuse. He has not quit drinking, still up to 1/2 pint liquor/day.  - Discussed imperative to continue cutting back ETOH.  4. Thrombocytopenia: ?Related to ETOH abuse.  Last CBC with plts up to 190K.  5. GI bleeding: Episode of suspected upper GI bleeding in 6/24 with melena.  EGD showed AVM that was non-bleeding, he had APC.  Colonoscopy showed diverticulosis.  Bleeding attributed to ETOH abuse.  Warfarin was stopped in hospital but he restarted after discharge.  He denies melena/BRBPR currently.  - Continue warfarin for now but decrease INR goal for the time being to 1.8-2.3 (he has not cut back on ETOH).  6. COPD: Severe obstruction on PFTs in 5/24, still smoking cigars. Suspect this contributes to his dyspnea.  - I urged him to quit.  - Refer to pulmonology for evaluation.   Followup in 48m with Dr. Bethel Born [facility] 06/14/2023

## 2023-06-15 LAB — BASIC METABOLIC PANEL WITH GFR
BUN/Creatinine Ratio: 15 (ref 9–20)
BUN: 12 mg/dL (ref 6–24)
CO2: 24 mmol/L (ref 20–29)
Calcium: 9.8 mg/dL (ref 8.7–10.2)
Chloride: 101 mmol/L (ref 96–106)
Creatinine, Ser: 0.8 mg/dL (ref 0.76–1.27)
Glucose: 85 mg/dL (ref 70–99)
Potassium: 4.1 mmol/L (ref 3.5–5.2)
Sodium: 140 mmol/L (ref 134–144)
eGFR: 106 mL/min/1.73

## 2023-06-15 LAB — BRAIN NATRIURETIC PEPTIDE: BNP: 30.9 pg/mL (ref 0.0–100.0)

## 2023-06-21 ENCOUNTER — Other Ambulatory Visit: Payer: Self-pay | Admitting: Nurse Practitioner

## 2023-06-21 ENCOUNTER — Telehealth: Payer: Self-pay | Admitting: Nurse Practitioner

## 2023-06-21 ENCOUNTER — Other Ambulatory Visit: Payer: Self-pay

## 2023-06-21 DIAGNOSIS — I4819 Other persistent atrial fibrillation: Secondary | ICD-10-CM

## 2023-06-21 MED ORDER — ROSUVASTATIN CALCIUM 20 MG PO TABS
20.0000 mg | ORAL_TABLET | Freq: Every day | ORAL | 3 refills | Status: DC
Start: 2023-06-21 — End: 2024-01-17

## 2023-06-21 NOTE — Telephone Encounter (Signed)
[facility] called and spoke to [place], [facility] about the refill(s) magnesium oxide requested. Advised it was sent on 04/25/23 #180/1 refill(s). She says that one is the 200 mg tabs, so the patient would have to take 2 pills. She says if the 400 mg Rx could be sent, he could just take pill. Advised I will send this to the provider.

## 2023-06-21 NOTE — Telephone Encounter (Signed)
Spoke with Lurena Joiner at [facility] and was informed they had been filling patient's prescription of 400 mg of Magnesium Oxide. Patient was instructed to take 0.5 tablet. Pharmacy tech Lurena Joiner says they do not usually carry the 200 mg of the prescription in stock and they typically have to order it. She says she can order it for the patient and it shall be there tomorrow for pick up.

## 2023-06-21 NOTE — Telephone Encounter (Signed)
Medication Refill - Medication: MAGNESIUM-OXIDE 400 (240 Mg) MG tablet   Is completely out of the medication.  Please advise.  Has the patient contacted their pharmacy? Yes.    (Agent: If yes, when and what did the pharmacy advise?)  Preferred Pharmacy (with phone number or street name):  [facility] [address]), [place] - [address]  [address]) [place] [address]  Phone: [phone] Fax: [phone]  Hours: Not open 24 hours   Has the patient been seen for an appointment in the last year OR does the patient have an upcoming appointment? Yes.    Agent: Please be advised that RX refills may take up to 3 business days. We ask that you follow-up with your pharmacy.

## 2023-06-22 NOTE — Telephone Encounter (Signed)
No longer current dosing on this medication  Requested Prescriptions  Pending Prescriptions Disp Refills   MAGNESIUM-OXIDE 400 (240 Mg) MG tablet [Pharmacy Med Name: MAGnesium-Oxide 400 (241.3 Mg) MG Oral Tablet] 90 tablet 0    Sig: Take 1/2 (one-half) tablet by mouth twice daily     Endocrinology:  Minerals - Magnesium Supplementation Passed - 06/21/2023 10:36 AM      Passed - Mg Level in normal range and within 360 days    Magnesium  Date Value Ref Range Status  04/26/2023 2.1 1.7 - 2.4 mg/dL Final    Comment:    Performed at [facility], [address] [place], [place] [address]         Passed - Cr in normal range and within 360 days    Creatinine, Ser  Date Value Ref Range Status  06/14/2023 0.80 0.76 - 1.27 mg/dL Final   Creatinine, Urine  Date Value Ref Range Status  11/11/2022 80 mg/dL Final    Comment:    Performed at [facility], [address], [place], [place] [address]         Passed - Valid encounter within last 12 months    Recent Outpatient Visits           1 month ago Persistent atrial fibrillation [facility])   [place] [facility] [name], NP   3 months ago Chronic heart failure with preserved ejection fraction [facility])   [place] [facility] [name], NP   5 months ago Hypomagnesemia   [facility] [name], NP   7 months ago Hospital discharge follow-up   [place] [facility] [name], NP   10 months ago Primary hypertension   [place] [facility] [name], NP       Future Appointments             In 1 month Mecum, Oswaldo Conroy, PA-C  [place] [facility], [facility]

## 2023-06-27 DIAGNOSIS — I5032 Chronic diastolic (congestive) heart failure: Secondary | ICD-10-CM | POA: Diagnosis not present

## 2023-06-27 DIAGNOSIS — F10920 Alcohol use, unspecified with intoxication, uncomplicated: Secondary | ICD-10-CM | POA: Diagnosis not present

## 2023-06-27 DIAGNOSIS — D500 Iron deficiency anemia secondary to blood loss (chronic): Secondary | ICD-10-CM | POA: Diagnosis not present

## 2023-06-27 DIAGNOSIS — I509 Heart failure, unspecified: Secondary | ICD-10-CM | POA: Diagnosis not present

## 2023-06-28 ENCOUNTER — Other Ambulatory Visit: Payer: Self-pay | Admitting: Nurse Practitioner

## 2023-06-28 ENCOUNTER — Ambulatory Visit: Payer: BC Managed Care – PPO

## 2023-06-28 DIAGNOSIS — I4819 Other persistent atrial fibrillation: Secondary | ICD-10-CM

## 2023-06-28 DIAGNOSIS — Z7901 Long term (current) use of anticoagulants: Secondary | ICD-10-CM | POA: Diagnosis not present

## 2023-06-28 LAB — POCT INR: INR: 1.1 — AB (ref 2.0–3.0)

## 2023-06-28 NOTE — Patient Instructions (Signed)
TAKE 2 TABLETS TODAY and 1.5 tablets tomorrow ONLY THEN Continue 1 tablet Daily.   Recheck in 2 weeks. Keep your intake of greens consistent (1 HELPING OF GREENS PER WEEK) Please call the Coumadin Clinic with any questions at [phone]

## 2023-06-29 NOTE — Telephone Encounter (Signed)
Requested Prescriptions  Refused Prescriptions Disp Refills   magnesium oxide (MAG-OX) 400 (240 Mg) MG tablet [Pharmacy Med Name: Magnesium Oxide 400 (240 Mg) MG Oral Tablet] 90 tablet 0    Sig: Take 1/2 (one-half) tablet by mouth twice daily     Endocrinology:  Minerals - Magnesium Supplementation Passed - 06/28/2023 12:01 PM      Passed - Mg Level in normal range and within 360 days    Magnesium  Date Value Ref Range Status  04/26/2023 2.1 1.7 - 2.4 mg/dL Final    Comment:    Performed at [facility], [address] [place], [place] [address]         Passed - Cr in normal range and within 360 days    Creatinine, Ser  Date Value Ref Range Status  06/14/2023 0.80 0.76 - 1.27 mg/dL Final   Creatinine, Urine  Date Value Ref Range Status  11/11/2022 80 mg/dL Final    Comment:    Performed at [facility], [address], [place], [place] [address]         Passed - Valid encounter within last 12 months    Recent Outpatient Visits           2 months ago Persistent atrial fibrillation [facility])   [place] [facility] [name], NP   4 months ago Chronic heart failure with preserved ejection fraction [facility]   [place] [facility] [name], NP   6 months ago Hypomagnesemia   [place] [name], NP   7 months ago Hospital discharge follow-up   [place] [facility] [name], NP   10 months ago Primary hypertension   [place] [facility] [name], NP       Future Appointments             In 4 weeks Mecum, Oswaldo Conroy, PA-C [place] [facility], [facility]

## 2023-06-30 ENCOUNTER — Other Ambulatory Visit: Payer: Self-pay | Admitting: Nurse Practitioner

## 2023-06-30 ENCOUNTER — Ambulatory Visit (INDEPENDENT_AMBULATORY_CARE_PROVIDER_SITE_OTHER): Payer: BC Managed Care – PPO | Admitting: Nurse Practitioner

## 2023-06-30 ENCOUNTER — Encounter: Payer: Self-pay | Admitting: Nurse Practitioner

## 2023-06-30 VITALS — BP 140/90 | HR 82 | Temp 97.7°F | Ht 70.0 in | Wt 212.4 lb

## 2023-06-30 DIAGNOSIS — F17200 Nicotine dependence, unspecified, uncomplicated: Secondary | ICD-10-CM

## 2023-06-30 DIAGNOSIS — J441 Chronic obstructive pulmonary disease with (acute) exacerbation: Secondary | ICD-10-CM

## 2023-06-30 DIAGNOSIS — I5032 Chronic diastolic (congestive) heart failure: Secondary | ICD-10-CM

## 2023-06-30 DIAGNOSIS — F1010 Alcohol abuse, uncomplicated: Secondary | ICD-10-CM

## 2023-06-30 DIAGNOSIS — G4734 Idiopathic sleep related nonobstructive alveolar hypoventilation: Secondary | ICD-10-CM | POA: Diagnosis not present

## 2023-06-30 DIAGNOSIS — J449 Chronic obstructive pulmonary disease, unspecified: Secondary | ICD-10-CM | POA: Diagnosis not present

## 2023-06-30 DIAGNOSIS — I4819 Other persistent atrial fibrillation: Secondary | ICD-10-CM

## 2023-06-30 DIAGNOSIS — F17210 Nicotine dependence, cigarettes, uncomplicated: Secondary | ICD-10-CM

## 2023-06-30 MED ORDER — BREZTRI AEROSPHERE 160-9-4.8 MCG/ACT IN AERO: 2.0000 | INHALATION_SPRAY | Freq: Two times a day (BID) | RESPIRATORY_TRACT | 0 refills | Status: DC

## 2023-06-30 NOTE — Assessment & Plan Note (Signed)
Rate controlled. Anticoagulated with coumadin. No excessive bruising/bleeding. Follow up with cardiology as scheduled

## 2023-06-30 NOTE — Assessment & Plan Note (Signed)
Smoking cessation strongly advised. Referred to lung cancer screening program

## 2023-06-30 NOTE — Assessment & Plan Note (Signed)
Alcohol cessation advised

## 2023-06-30 NOTE — Patient Instructions (Signed)
Continue Albuterol inhaler 2 puffs or 3 mL neb every 6 hours as needed for shortness of breath or wheezing. Notify if symptoms persist despite rescue inhaler/neb use. Use nebs twice a day before your inhaler and then follow with flutter valve 10 times Stop Trelegy. Try Breztri 2 puffs Twice daily. Brush tongue and rinse mouth afterwards. If you notice an improvement, let me know so I can send an updated prescription   Prednisone 40 mg daily for 5 days. Take in AM with food Guaifenesin 600 mg Twice daily for cough/congestion (over the counter medication; Mucinex brand or alternative brand is fine)  Encourage you to stop smoking!!   Referral to lung cancer screening program  Referral to pulmonary rehab  Follow up in 6 weeks with [name] or Katie Nitzia Perren,NP. If symptoms do not improve or worsen, please contact office for sooner follow up or seek emergency care.

## 2023-06-30 NOTE — Assessment & Plan Note (Signed)
Very severe COPD with high symptom burden, which is likely multifactorial related to cardiac and lung disease as well as deconditioning. He has bronchospasm on exam today. Will challenge him with prednisone burst and trial change to [facility] style inhaler with Breztri from DPI as I am concerned he may not be able to generate the airflow. He will continue PRN SABA - provide with nebs. Initiate mucociliary clearance therapies. Discussed role of pulmonary rehab - referral placed today. Action plan in place.   Patient Instructions  Continue Albuterol inhaler 2 puffs or 3 mL neb every 6 hours as needed for shortness of breath or wheezing. Notify if symptoms persist despite rescue inhaler/neb use. Use nebs twice a day before your inhaler and then follow with flutter valve 10 times Stop Trelegy. Try Breztri 2 puffs Twice daily. Brush tongue and rinse mouth afterwards. If you notice an improvement, let me know so I can send an updated prescription   Prednisone 40 mg daily for 5 days. Take in AM with food Guaifenesin 600 mg Twice daily for cough/congestion (over the counter medication; Mucinex brand or alternative brand is fine)  Encourage you to stop smoking!!   Referral to lung cancer screening program  Referral to pulmonary rehab  Follow up in 6 weeks with [name] or Katie Danayah Smyre,NP. If symptoms do not improve or worsen, please contact office for sooner follow up or seek emergency care.

## 2023-06-30 NOTE — Telephone Encounter (Signed)
Requested Prescriptions  Refused Prescriptions Disp Refills   MAGNESIUM-OXIDE 400 (240 Mg) MG tablet [Pharmacy Med Name: MAGnesium-Oxide 400 (241.3 Mg) MG Oral Tablet] 90 tablet 0    Sig: Take 1/2 (one-half) tablet by mouth twice daily     Endocrinology:  Minerals - Magnesium Supplementation Passed - 06/30/2023 10:20 AM      Passed - Mg Level in normal range and within 360 days    Magnesium  Date Value Ref Range Status  04/26/2023 2.1 1.7 - 2.4 mg/dL Final    Comment:    Performed at [facility], [address] [place], [place] [address]         Passed - Cr in normal range and within 360 days    Creatinine, Ser  Date Value Ref Range Status  06/14/2023 0.80 0.76 - 1.27 mg/dL Final   Creatinine, Urine  Date Value Ref Range Status  11/11/2022 80 mg/dL Final    Comment:    Performed at [facility], [address] [place], [place] [address]         Passed - Valid encounter within last 12 months    Recent Outpatient Visits           2 months ago Persistent atrial fibrillation [facility])   [place] [facility] [name], NP   4 months ago Chronic heart failure with preserved ejection fraction [facility])   [place] [facility] [name], NP   6 months ago Hypomagnesemia   [place] [facility] [name], NP   7 months ago Hospital discharge follow-up   [place] [facility] [name], NP   10 months ago Primary hypertension   [place] [facility] [name], NP       Future Appointments             In 3 weeks Mecum, Oswaldo Conroy, PA-C [place] [facility]   In 1 month Allison Quarry, Ruby Cola, NP [place] [facility]

## 2023-06-30 NOTE — Progress Notes (Signed)
@Patient  ID: Jose [name]., male    DOB: Apr 30, 1971, 52 y.o.   MRN: [id number]  Chief Complaint  Patient presents with   Follow-up    SOB. Cough with clear to yellow sputum. Wheezing.     Referring provider: [name], NP  HPI: 52 year old male, active cigar smoker and former cigarette smoker followed for severe COPD and nocturnal hypoxemia.  He is patient [name] and last seen in office 05/05/2022.  Past medical history significant for hypertension, CHF, A-fib on Coumadin, seizure disorder, alcohol abuse, prediabetes.  TEST/EVENTS:  04/27/2022 PFT: FVC 57, FEV1 43, ratio 69, TLC 70, DLCO 39 03/14/2023 PFT: FVC 46, FEV1 28, ratio 45  05/05/2022: OV with [name].  Initially seen for consult February 18, 2022.  He had been started on supplemental oxygen after discharge from the hospital.  He was given Trelegy which he states helps however he does not use this every day.  He instead relies on albuterol several times a day.  Could not explain why he is not taking his Trelegy daily.  He continues to drink approximately half pint of liquor per day, sometimes more.  Had PFTs on 04/27/2022 that showed stage III COPD.  Addressed concerns regarding patient smoking and drinking.  He was referred to neurology at a previous visit to manage his antiseizure medications but he never made the appointment.  Patient's mother stated that she would ensure that he does this.  Having some mild memory impairment, likely due to daily alcohol intake.  Advised to resume Trelegy daily.  Counseled on discontinuing alcohol/seeking counseling.  Smoking cessation also advised.  Referred to lung cancer screening program.  Referred back to neurology again.  06/30/2023: Today - follow up Patient presents today for follow-up with his mother.  Since he was seen last, he was hospitalized in January for seizure and new onset atrial fibrillation.  He has been having ongoing workup with cardiology since then.  He was then  hospitalized again in June 2024 for GI bleed, anemia, AKI.  He has recovered since then and is feeling more so back to his baseline.  He does tell me that he has had a lot more trouble with his breathing over the last year or so.  Feels like he cannot do much of anything without getting out of breath.  He does have a daily productive cough with white phlegm.  Having some occasional congestion.  He does  noticed some wheezing.  Denies any fevers, chills, hemoptysis, lower extremity swelling, orthopnea, weight gain, PND, chest pain, palpitations.  He is using his Trelegy daily.  Using albuterol a few times a day as well.  Does not have a nebulizer machine at home.  Never had lung cancer screening CT.  He still smoking 2 cigars a day.  Quit smoking cigarettes 3 years ago.  Last pulmonary function testing showed a decline in lung function with FEV1 from 43% to 28%; however the patient did have difficulty comprehending testing instruction and performing necessary components.  Testing did not meet the ATS criteria. Still drinking alcohol daily.  No Known Allergies  Immunization History  Administered Date(s) Administered   Influenza,inj,Quad PF,6+ Mos 08/17/2022   Influenza-Unspecified 08/22/2019   PFIZER(Purple Top)SARS-COV-2 Vaccination 09/14/2020, 10/19/2020   PNEUMOCOCCAL CONJUGATE-20 08/26/2022    Past Medical History:  Diagnosis Date   Alcohol abuse    Cervicalgia    CHF (congestive heart failure) (HCC)    COPD (chronic obstructive pulmonary disease) (HCC)    Hyperlipidemia  Hypertension    Seizures (HCC)     Tobacco History: Social History   Tobacco Use  Smoking Status Every Day   Average packs/day: 1.5 packs/day for 30.0 years (45.0 ttl pk-yrs)   Types: Cigarettes, Cigars   Start date: 73   Last attempt to quit: 2021   Years since quitting: 3.6  Smokeless Tobacco Former  Tobacco Comments   2 cigars a day- 06/30/2023 khj   Quit smoking cigarettes 3 years ago.  Smokes cigarettes  since he was 52 years old.   Ready to quit: Not Answered Counseling given: Not Answered Tobacco comments: 2 cigars a day- 06/30/2023 khj Quit smoking cigarettes 3 years ago.  Smokes cigarettes since he was 52 years old.   Outpatient Medications Prior to Visit  Medication Sig Dispense Refill   albuterol (VENTOLIN HFA) 108 (90 Base) MCG/ACT inhaler Inhale 2 puffs into the lungs every 6 (six) hours as needed for wheezing or shortness of breath. 8 g 2   carbamazepine (TEGRETOL) 200 MG tablet Take 1 tablet (200 mg total) by mouth 2 (two) times daily. 180 tablet 1   empagliflozin (JARDIANCE) 10 MG TABS tablet Take 1 tablet (10 mg total) by mouth daily before breakfast. 90 tablet 1   ferrous sulfate (SV IRON) 325 (65 FE) MG tablet Take 1 tablet by mouth once daily 90 tablet 3   furosemide (LASIX) 40 MG tablet Take 1 tablet (40 mg total) by mouth every morning. 30 tablet 3   losartan (COZAAR) 25 MG tablet Take 0.5 tablets (12.5 mg total) by mouth at bedtime. 45 tablet 3   Magnesium Oxide -Mg Supplement (MAG-OXIDE) 200 MG TABS Take 1 tablet (200 mg total) by mouth 2 (two) times daily. 180 tablet 1   metoprolol tartrate (LOPRESSOR) 25 MG tablet Take 1 tablet (25 mg total) by mouth 2 (two) times daily. 60 tablet 3   Multiple Vitamin (MULTIVITAMIN WITH MINERALS) TABS tablet Take 1 tablet by mouth daily. 90 tablet 0   potassium chloride (KLOR-CON) 10 MEQ tablet Take 20 mEq by mouth daily.     rosuvastatin (CRESTOR) 20 MG tablet Take 1 tablet (20 mg total) by mouth daily. 90 tablet 3   spironolactone (ALDACTONE) 25 MG tablet Take 0.5 tablets (12.5 mg total) by mouth daily. 15 tablet 6   warfarin (COUMADIN) 5 MG tablet Take 1 tablet (5 mg total) by mouth as directed. 30 tablet 3   Fluticasone-Umeclidin-Vilant (TRELEGY ELLIPTA) 100-62.5-25 MCG/ACT AEPB Inhale 1 puff into the lungs daily. 14 each 0   folic acid (FOLVITE) 1 MG tablet Take 1 tablet (1 mg total) by mouth daily. (Patient not taking: Reported on  05/01/2023) 90 tablet 0   thiamine (VITAMIN B-1) 100 MG tablet Take 1 tablet (100 mg total) by mouth daily. (Patient not taking: Reported on 05/01/2023) 90 tablet 0   No facility-administered medications prior to visit.     Review of Systems:   Constitutional: No weight loss or gain, night sweats, fevers, chills, or lassitude. +fatigue (baseline) HEENT: No headaches, difficulty swallowing, tooth/dental problems, or sore throat. No sneezing, itching, ear ache, nasal congestion, or post nasal drip CV:  No chest pain, orthopnea, PND, swelling in lower extremities, anasarca, dizziness, palpitations, syncope Resp: +shortness of breath with exertion; chest congestion; daily cough; wheezing. No excess mucus or change in color of mucus. No hemoptysis. No chest wall deformity GI:  No heartburn, indigestion, abdominal pain, nausea, vomiting, diarrhea, change in bowel habits, loss of appetite, bloody stools.  GU: No dysuria,  change in color of urine, urgency or frequency.   Skin: No rash, lesions, ulcerations MSK:  No joint pain or swelling.   Neuro: No dizziness or lightheadedness.  Psych: No depression or anxiety. Mood stable.     Physical Exam:  BP (!) 140/90 (BP Location: Left Arm, Cuff Size: Large)   Pulse 82   Temp 97.7 F (36.5 C)   Ht 5\' 10"  (1.778 m)   Wt 212 lb 6.4 oz (96.3 kg)   SpO2 97%   BMI 30.48 kg/m   GEN: Pleasant, interactive, well-kempt; in no acute distress. HEENT:  Normocephalic and atraumatic. PERRLA. Sclera white. Nasal turbinates pink, moist and patent bilaterally. No rhinorrhea present. Oropharynx pink and moist, without exudate or edema. No lesions, ulcerations, or postnasal drip.  NECK:  Supple w/ fair ROM. No JVD present. Normal carotid impulses w/o bruits. Thyroid symmetrical with no goiter or nodules palpated. No lymphadenopathy.   CV: Irregular rhythm, rate controlled, no m/r/g, no peripheral edema. Pulses intact, +2 bilaterally. No cyanosis, pallor or  clubbing. PULMONARY:  Unlabored, regular breathing. Scattered wheezes bilaterally A&P. Congested cough. No accessory muscle use.  GI: BS present and normoactive. Soft, non-tender to palpation. No organomegaly or masses detected.  MSK: No erythema, warmth or tenderness. Cap refil <2 sec all extrem. No deformities or joint swelling noted.  Neuro: A/Ox3. No focal deficits noted.   Skin: Warm, no lesions or rashe Psych: Normal affect and behavior. Judgement and thought content appropriate.     Lab Results:  CBC    Component Value Date/Time   WBC 7.3 05/12/2023 0940   RBC 2.66 (L) 05/12/2023 0940   HGB 8.9 (L) 05/12/2023 0940   HGB 8.0 (L) 04/25/2023 1045   HCT 27.6 (L) 05/12/2023 0940   HCT 23.4 (L) 04/25/2023 1045   PLT 189 05/12/2023 0940   PLT 178 04/25/2023 1045   MCV 103.8 (H) 05/12/2023 0940   MCV 99 (H) 04/25/2023 1045   MCH 33.5 05/12/2023 0940   MCHC 32.2 05/12/2023 0940   RDW 18.0 (H) 05/12/2023 0940   RDW 15.8 (H) 04/25/2023 1045   LYMPHSABS 1.2 04/25/2023 1045   MONOABS 0.8 11/16/2022 0321   EOSABS 0.2 04/25/2023 1045   BASOSABS 0.1 04/25/2023 1045    BMET    Component Value Date/Time   NA 140 06/14/2023 1130   K 4.1 06/14/2023 1130   CL 101 06/14/2023 1130   CO2 24 06/14/2023 1130   GLUCOSE 85 06/14/2023 1130   GLUCOSE 106 (H) 05/01/2023 1115   BUN 12 06/14/2023 1130   CREATININE 0.80 06/14/2023 1130   CALCIUM 9.8 06/14/2023 1130   GFRNONAA 60 (L) 05/01/2023 1115   GFRAA >60 05/16/2020 2250    BNP    Component Value Date/Time   BNP 30.9 06/14/2023 1130   BNP 27.2 04/26/2023 1132     Imaging:  No results found.  Administration History     None          Latest Ref Rng & Units 03/14/2023   10:21 AM 04/27/2022   10:45 AM  PFT Results  FVC-Pre L 2.34  2.39   FVC-Predicted Pre % 46  57   FVC-Post L 1.65  1.95   FVC-Predicted Post % 33  46   Pre FEV1/FVC % % 47  60   Post FEV1/FCV % % 45  69   FEV1-Pre L 1.10  1.43   FEV1-Predicted Pre  % 28  43   FEV1-Post L 0.74  1.35  DLCO uncorrected ml/min/mmHg  11.42   DLCO UNC% %  39   DLVA Predicted %  78   TLC L  4.93   TLC % Predicted %  70   RV % Predicted %  130     No results found for: "NITRICOXIDE"      Assessment & Plan:   COPD (chronic obstructive pulmonary disease) (HCC) Very severe COPD with high symptom burden, which is likely multifactorial related to cardiac and lung disease as well as deconditioning. He has bronchospasm on exam today. Will challenge him with prednisone burst and trial change to [facility] style inhaler with Breztri from DPI as I am concerned he may not be able to generate the airflow. He will continue PRN SABA - provide with nebs. Initiate mucociliary clearance therapies. Discussed role of pulmonary rehab - referral placed today. Action plan in place.   Patient Instructions  Continue Albuterol inhaler 2 puffs or 3 mL neb every 6 hours as needed for shortness of breath or wheezing. Notify if symptoms persist despite rescue inhaler/neb use. Use nebs twice a day before your inhaler and then follow with flutter valve 10 times Stop Trelegy. Try Breztri 2 puffs Twice daily. Brush tongue and rinse mouth afterwards. If you notice an improvement, let me know so I can send an updated prescription   Prednisone 40 mg daily for 5 days. Take in AM with food Guaifenesin 600 mg Twice daily for cough/congestion (over the counter medication; Mucinex brand or alternative brand is fine)  Encourage you to stop smoking!!   Referral to lung cancer screening program  Referral to pulmonary rehab  Follow up in 6 weeks with [name] or Katie Sanaii Caporaso,NP. If symptoms do not improve or worsen, please contact office for sooner follow up or seek emergency care.    Nocturnal hypoxemia No increased O2 requirement. Will check walk test at follow up. Continue supplemental oxygen 2 lpm at night. Goal >88-90%  Persistent atrial fibrillation (HCC) Rate controlled. Anticoagulated  with coumadin. No excessive bruising/bleeding. Follow up with cardiology as scheduled  Chronic diastolic CHF (congestive heart failure) (HCC) Euvolemic. Follow up with cardiology as scheduled  Tobacco dependence Smoking cessation strongly advised. Referred to lung cancer screening program  Alcohol abuse Alcohol cessation advised   I spent 35 minutes of dedicated to the care of this patient on the date of this encounter to include pre-visit review of records, face-to-face time with the patient discussing conditions above, post visit ordering of testing, clinical documentation with the electronic health record, making appropriate referrals as documented, and communicating necessary findings to members of the patients care team.  Noemi Chapel, NP 06/30/2023  Pt aware and understands NP's role.

## 2023-06-30 NOTE — Assessment & Plan Note (Signed)
No increased O2 requirement. Will check walk test at follow up. Continue supplemental oxygen 2 lpm at night. Goal >88-90%

## 2023-06-30 NOTE — Assessment & Plan Note (Addendum)
Euvolemic. Follow up with cardiology as scheduled.  

## 2023-07-03 NOTE — Progress Notes (Signed)
Agree with the details of the visit as noted by Katherine Cobb, NP. ° °C. Laura Gonzalez, MD °Advanced Bronchoscopy °PCC[facility] ° °

## 2023-07-04 ENCOUNTER — Other Ambulatory Visit: Payer: Self-pay | Admitting: Nurse Practitioner

## 2023-07-04 DIAGNOSIS — I4819 Other persistent atrial fibrillation: Secondary | ICD-10-CM

## 2023-07-06 NOTE — Telephone Encounter (Signed)
Dose changed 04/25/23. Requested Prescriptions  Refused Prescriptions Disp Refills   magnesium oxide (MAG-OX) 400 (240 Mg) MG tablet [Pharmacy Med Name: Magnesium Oxide 400 (240 Mg) MG Oral Tablet] 90 tablet 0    Sig: Take 1/2 (one-half) tablet by mouth twice daily     Endocrinology:  Minerals - Magnesium Supplementation Passed - 07/04/2023  9:21 PM      Passed - Mg Level in normal range and within 360 days    Magnesium  Date Value Ref Range Status  04/26/2023 2.1 1.7 - 2.4 mg/dL Final    Comment:    Performed at [facility], [address] [place], [place] [address]         Passed - Cr in normal range and within 360 days    Creatinine, Ser  Date Value Ref Range Status  06/14/2023 0.80 0.76 - 1.27 mg/dL Final   Creatinine, Urine  Date Value Ref Range Status  11/11/2022 80 mg/dL Final    Comment:    Performed at [facility], [address] [place], [place] [address]         Passed - Valid encounter within last 12 months    Recent Outpatient Visits           2 months ago Persistent atrial fibrillation [facility])   [facility] [name], NP   4 months ago Chronic heart failure with preserved ejection fraction [facility])   [facility] [name], NP   6 months ago Hypomagnesemia   [facility] [name], NP   7 months ago Hospital discharge follow-up   [facility] [name], NP   10 months ago Primary hypertension   [facility] [name], NP       Future Appointments             In 3 weeks Mecum, Oswaldo Conroy, PA-C [facility]   In 1 month Allison Quarry, Ruby Cola, NP [facility] [facility]

## 2023-07-11 ENCOUNTER — Other Ambulatory Visit: Payer: Self-pay | Admitting: *Deleted

## 2023-07-11 DIAGNOSIS — J449 Chronic obstructive pulmonary disease, unspecified: Secondary | ICD-10-CM

## 2023-07-12 ENCOUNTER — Ambulatory Visit: Payer: BC Managed Care – PPO | Attending: Internal Medicine

## 2023-07-12 DIAGNOSIS — I4819 Other persistent atrial fibrillation: Secondary | ICD-10-CM | POA: Diagnosis not present

## 2023-07-12 DIAGNOSIS — Z7901 Long term (current) use of anticoagulants: Secondary | ICD-10-CM

## 2023-07-12 LAB — POCT INR: INR: 1.2 — AB (ref 2.0–3.0)

## 2023-07-12 NOTE — Patient Instructions (Signed)
TAKE 2 TABLETS TODAY ONLY THEN increase to 1 tablet Daily, except 2 tablets every Wednesday. Recheck in 2 weeks. Keep your intake of greens consistent (1 HELPING OF GREENS PER WEEK) Please call the Coumadin Clinic with any questions at [phone]

## 2023-07-14 ENCOUNTER — Other Ambulatory Visit: Payer: Self-pay | Admitting: *Deleted

## 2023-07-14 DIAGNOSIS — Z87891 Personal history of nicotine dependence: Secondary | ICD-10-CM

## 2023-07-14 DIAGNOSIS — Z122 Encounter for screening for malignant neoplasm of respiratory organs: Secondary | ICD-10-CM

## 2023-07-26 ENCOUNTER — Ambulatory Visit: Payer: BC Managed Care – PPO | Attending: Internal Medicine

## 2023-07-26 DIAGNOSIS — I4819 Other persistent atrial fibrillation: Secondary | ICD-10-CM

## 2023-07-26 DIAGNOSIS — Z7901 Long term (current) use of anticoagulants: Secondary | ICD-10-CM | POA: Diagnosis not present

## 2023-07-26 LAB — POCT INR: INR: 1.2 — AB (ref 2.0–3.0)

## 2023-07-26 MED ORDER — WARFARIN SODIUM 5 MG PO TABS
5.0000 mg | ORAL_TABLET | ORAL | 3 refills | Status: AC
Start: 2023-07-26 — End: ?

## 2023-07-26 NOTE — Patient Instructions (Signed)
Increase to 1 tablet Daily, except 2 tablets every Monday and Friday Recheck in 2 weeks. Please call the Coumadin Clinic with any questions at [phone]

## 2023-07-27 ENCOUNTER — Ambulatory Visit (INDEPENDENT_AMBULATORY_CARE_PROVIDER_SITE_OTHER): Payer: BC Managed Care – PPO | Admitting: Physician Assistant

## 2023-07-27 VITALS — BP 134/89 | HR 74 | Temp 98.5°F | Wt 213.6 lb

## 2023-07-27 DIAGNOSIS — J449 Chronic obstructive pulmonary disease, unspecified: Secondary | ICD-10-CM | POA: Diagnosis not present

## 2023-07-27 DIAGNOSIS — I5032 Chronic diastolic (congestive) heart failure: Secondary | ICD-10-CM

## 2023-07-27 DIAGNOSIS — G40909 Epilepsy, unspecified, not intractable, without status epilepticus: Secondary | ICD-10-CM

## 2023-07-27 DIAGNOSIS — I4819 Other persistent atrial fibrillation: Secondary | ICD-10-CM | POA: Diagnosis not present

## 2023-07-27 DIAGNOSIS — I10 Essential (primary) hypertension: Secondary | ICD-10-CM | POA: Diagnosis not present

## 2023-07-27 DIAGNOSIS — E7800 Pure hypercholesterolemia, unspecified: Secondary | ICD-10-CM

## 2023-07-27 DIAGNOSIS — F1010 Alcohol abuse, uncomplicated: Secondary | ICD-10-CM

## 2023-07-27 MED ORDER — ALBUTEROL SULFATE HFA 108 (90 BASE) MCG/ACT IN AERS: 2.0000 | INHALATION_SPRAY | Freq: Four times a day (QID) | RESPIRATORY_TRACT | 2 refills | Status: DC | PRN

## 2023-07-27 MED ORDER — BREZTRI AEROSPHERE 160-9-4.8 MCG/ACT IN AERO: 2.0000 | INHALATION_SPRAY | Freq: Two times a day (BID) | RESPIRATORY_TRACT | 1 refills | Status: DC

## 2023-07-27 MED ORDER — MAG-OXIDE 200 MG PO TABS: 200.0000 mg | ORAL_TABLET | Freq: Two times a day (BID) | ORAL | 1 refills | Status: DC

## 2023-07-27 MED ORDER — FOLIC ACID 1 MG PO TABS: 1.0000 mg | ORAL_TABLET | Freq: Every day | ORAL | 0 refills | Status: DC

## 2023-07-27 NOTE — Progress Notes (Signed)
Established Patient Office Visit  Name: Jose [name].   MRN: [id number]    DOB: 1971-11-03   Date:07/27/2023  Today's Provider: Jacquelin Hawking, MHS, PA-C Introduced myself to the patient as a PA-C and provided education on APPs in clinical practice.         Subjective  Chief Complaint  Chief Complaint  Patient presents with   Atrial Fibrillation   Hypertension   COPD    He is here with his mother who is assisting with HPI    HPI  COPD Was seen by Pulmonology 06/30/23  Regimen was changed to [facility] from trelegy and continued with SABA He had referral placed to Pulmonary rehab and was provided with neb Has follow up with Pulm in Oct  He reports breathing is sometimes good and sometimes bad Depends on whether is moving around a lot He is using albuterol about 2 times per day - thinks he needs refill today for this and breztri  He thinks he is almost out of [place] - he does not think it is helping more than the Spiriva    Persistent a. Fib/HTN/ CHF   He was seen by anticoag clinic yesterday  INR was 1.2  -they are managing coumadin dosing  He was most recently seen by Cardiology 06/14/23  INR goal is at 1.8-2.3 for now per Cardiology  He was started on Losartan 12.5 mg PO at bedtime and instructed to cut back on drinking and smoking   His to taken lasix 40 mg PO every day and jardiance for CHF along with spironolactone 12.5 mg  He reports some flutters with exertion  He denies chest pain, dizziness, leg swelling   EtOH abuse  Ongoing  Drinking about half pint every other day  He is trying to cut down on this as well    Smoking  He is smoking 1.5 cigars daily  He reports he is trying to quit   Epilepsy  He has seen Neruology in 05/17/23  They are managing Carbamazepine now? Mother states he is taking daily   Patient Active Problem List   Diagnosis Date Noted   Nocturnal hypoxemia 06/30/2023   Upper GI bleed 04/26/2023   Blood loss anemia 04/26/2023    AKI (acute kidney injury) (HCC) 04/26/2023   Hyperkalemia 04/26/2023   Alcohol use disorder 04/26/2023   Long term (current) use of anticoagulants 12/28/2022   Persistent atrial fibrillation (HCC) 12/19/2022   Seizure disorder (HCC) 11/11/2022   Acute metabolic encephalopathy 11/11/2022   Thrombocytopenia (HCC) 11/11/2022   Elevated LFTs 11/11/2022   Atrial fibrillation with RVR (HCC) 11/11/2022   Right knee pain 11/11/2022   Hypomagnesemia 11/11/2022   Prediabetes 08/24/2022   Acute CHF (congestive heart failure) (HCC) 12/21/2021   Alcoholic intoxication without complication (HCC) 02/22/2021   Chronic diastolic CHF (congestive heart failure) (HCC) 09/08/2020   Iron deficiency anemia due to chronic blood loss    Family history of colon cancer 06/28/2016   Seizures (HCC) 04/30/2015   Hypertension 04/30/2015   Alcohol abuse 04/30/2015   Epilepsy (HCC) 04/30/2015   Tobacco dependence 04/30/2015   COPD (chronic obstructive pulmonary disease) (HCC) 04/30/2015   Lumbago 04/30/2015   Hypercholesterolemia 04/30/2015   Chronic pain 04/30/2015   Neck pain 04/22/2013    Past Surgical History:  Procedure Laterality Date   CARDIOVERSION N/A 01/12/2023   Procedure: CARDIOVERSION;  Surgeon: Laurey Morale, MD;  Location: [facility] ORS;  Service: Cardiovascular;  Laterality:  N/A;   CHOLECYSTECTOMY     COLONOSCOPY N/A 04/28/2023   Procedure: COLONOSCOPY;  Surgeon: Toledo, Boykin Nearing, MD;  Location: [facility] ENDOSCOPY;  Service: Gastroenterology;  Laterality: N/A;   COLONOSCOPY WITH PROPOFOL N/A 01/16/2018   Procedure: COLONOSCOPY WITH PROPOFOL;  Surgeon: Toney Reil, MD;  Location: [facility] ENDOSCOPY;  Service: Gastroenterology;  Laterality: N/A;   ESOPHAGOGASTRODUODENOSCOPY (EGD) WITH PROPOFOL N/A 01/16/2018   Procedure: ESOPHAGOGASTRODUODENOSCOPY (EGD) WITH PROPOFOL;  Surgeon: Toney Reil, MD;  Location: [facility] ENDOSCOPY;  Service: Gastroenterology;  Laterality: N/A;    ESOPHAGOGASTRODUODENOSCOPY (EGD) WITH PROPOFOL N/A 04/27/2023   Procedure: ESOPHAGOGASTRODUODENOSCOPY (EGD) WITH PROPOFOL;  Surgeon: Toledo, Boykin Nearing, MD;  Location: [facility] ENDOSCOPY;  Service: Gastroenterology;  Laterality: N/A;   HOT HEMOSTASIS  04/27/2023   Procedure: HOT HEMOSTASIS (ARGON PLASMA COAGULATION/BICAP);  Surgeon: Toledo, Boykin Nearing, MD;  Location: [facility] ENDOSCOPY;  Service: Gastroenterology;;   TEE WITHOUT CARDIOVERSION N/A 01/12/2023   Procedure: TRANSESOPHAGEAL ECHOCARDIOGRAM (TEE);  Surgeon: Laurey Morale, MD;  Location: [facility] ORS;  Service: Cardiovascular;  Laterality: N/A;   TONSILLECTOMY      Family History  Problem Relation Age of Onset   Hypertension Mother    Diabetes Mother    Colon cancer Mother    Hypertension Father    Multiple sclerosis Father     Social History   Tobacco Use   Smoking status: Every Day    Average packs/day: 1.5 packs/day for 30.0 years (45.0 ttl pk-yrs)    Types: Cigarettes, Cigars    Start date: 37    Last attempt to quit: 2021    Years since quitting: 3.7   Smokeless tobacco: Former   Tobacco comments:    2 cigars a day- 06/30/2023 khj    Quit smoking cigarettes 3 years ago.  Smokes cigarettes since he was 52 years old.  Substance Use Topics   Alcohol use: Yes    Alcohol/week: 28.0 standard drinks of alcohol    Types: 7 Cans of beer, 21 Shots of liquor per week    Comment: pint of liquor per day     Current Outpatient Medications:    albuterol (VENTOLIN HFA) 108 (90 Base) MCG/ACT inhaler, Inhale 2 puffs into the lungs every 6 (six) hours as needed for wheezing or shortness of breath., Disp: 8 g, Rfl: 2   Budeson-Glycopyrrol-Formoterol (BREZTRI AEROSPHERE) 160-9-4.8 MCG/ACT AERO, Inhale 2 puffs into the lungs in the morning and at bedtime., Disp: 10.7 g, Rfl: 1   carbamazepine (TEGRETOL) 200 MG tablet, Take 1 tablet (200 mg total) by mouth 2 (two) times daily., Disp: 180 tablet, Rfl: 1   empagliflozin (JARDIANCE) 10 MG TABS  tablet, Take 1 tablet (10 mg total) by mouth daily before breakfast., Disp: 90 tablet, Rfl: 1   ferrous sulfate (SV IRON) 325 (65 FE) MG tablet, Take 1 tablet by mouth once daily, Disp: 90 tablet, Rfl: 3   folic acid (FOLVITE) 1 MG tablet, Take 1 tablet (1 mg total) by mouth daily., Disp: 90 tablet, Rfl: 0   furosemide (LASIX) 40 MG tablet, Take 1 tablet (40 mg total) by mouth every morning., Disp: 30 tablet, Rfl: 3   losartan (COZAAR) 25 MG tablet, Take 0.5 tablets (12.5 mg total) by mouth at bedtime., Disp: 45 tablet, Rfl: 3   Magnesium Oxide -Mg Supplement (MAG-OXIDE) 200 MG TABS, Take 1 tablet (200 mg total) by mouth 2 (two) times daily., Disp: 180 tablet, Rfl: 1   metoprolol tartrate (LOPRESSOR) 25 MG tablet, Take 1 tablet (25 mg total) by  mouth 2 (two) times daily., Disp: 60 tablet, Rfl: 3   Multiple Vitamin (MULTIVITAMIN WITH MINERALS) TABS tablet, Take 1 tablet by mouth daily., Disp: 90 tablet, Rfl: 0   potassium chloride (KLOR-CON) 10 MEQ tablet, Take 20 mEq by mouth daily., Disp: , Rfl:    rosuvastatin (CRESTOR) 20 MG tablet, Take 1 tablet (20 mg total) by mouth daily., Disp: 90 tablet, Rfl: 3   spironolactone (ALDACTONE) 25 MG tablet, Take 0.5 tablets (12.5 mg total) by mouth daily., Disp: 15 tablet, Rfl: 6   thiamine (VITAMIN B-1) 100 MG tablet, Take 1 tablet (100 mg total) by mouth daily. (Patient not taking: Reported on 05/01/2023), Disp: 90 tablet, Rfl: 0   warfarin (COUMADIN) 5 MG tablet, Take 1 tablet (5 mg total) by mouth as directed., Disp: 50 tablet, Rfl: 3  No Known Allergies  I personally reviewed active problem list, medication list, allergies, health maintenance, notes from last encounter, notes from last 5 encounters, lab results with the patient/caregiver today.   Review of Systems  Eyes:  Negative for blurred vision and double vision.  Respiratory:  Positive for shortness of breath. Negative for wheezing.   Cardiovascular:  Positive for palpitations. Negative for chest  pain and leg swelling.  Musculoskeletal:  Positive for myalgias (both knees are hurting despite using brace, bengay).  Neurological:  Negative for dizziness and headaches.      Objective  Vitals:   07/27/23 1544  BP: 134/89  Pulse: 74  Temp: 98.5 F (36.9 C)  TempSrc: Oral  SpO2: 94%  Weight: 213 lb 9.6 oz (96.9 kg)    Body mass index is 30.65 kg/m.  Physical Exam Vitals reviewed.  Constitutional:      General: He is awake.     Appearance: Normal appearance. He is well-developed and well-groomed.  HENT:     Head: Normocephalic and atraumatic.  Pulmonary:     Effort: Pulmonary effort is normal.  Musculoskeletal:     Cervical back: Normal range of motion.  Neurological:     Mental Status: He is alert and oriented to person, place, and time.     GCS: GCS eye subscore is 4. GCS verbal subscore is 5. GCS motor subscore is 6.     Gait: Gait is intact.  Psychiatric:        Attention and Perception: Attention and perception normal.        Mood and Affect: Mood and affect normal.        Speech: Speech normal.        Behavior: Behavior normal. Behavior is cooperative.      Recent Results (from the past 2160 hour(s))  Iron and TIBC     Status: Abnormal   Collection Time: 05/01/23 11:15 AM  Result Value Ref Range   Iron 164 45 - 182 ug/dL   TIBC 409 811 - 914 ug/dL   Saturation Ratios 64 (H) 17.9 - 39.5 %   UIBC 91 ug/dL    Comment: Performed at [facility], [address] [place], [place] [address]  Ferritin     Status: None   Collection Time: 05/01/23 11:15 AM  Result Value Ref Range   Ferritin 220 24 - 336 ng/mL    Comment: Performed at [facility], [address] [place], [place] [address]  CBC     Status: Abnormal   Collection Time: 05/01/23 11:15 AM  Result Value Ref Range   WBC 8.3 4.0 - 10.5 K/uL   RBC 2.47 (L) 4.22 -  5.81 MIL/uL   Hemoglobin 8.0 (L) 13.0 - 17.0 g/dL   HCT 69.6 (L) 29.5 - 28.4 %   MCV 104.0 (H) 80.0 - 100.0 fL    MCH 32.4 26.0 - 34.0 pg   MCHC 31.1 30.0 - 36.0 g/dL   RDW 13.2 (H) 44.0 - 10.2 %   Platelets 128 (L) 150 - 400 K/uL   nRBC 0.4 (H) 0.0 - 0.2 %    Comment: Performed at [facility], [address] [place], [place] [address]  Basic metabolic panel     Status: Abnormal   Collection Time: 05/01/23 11:15 AM  Result Value Ref Range   Sodium 139 135 - 145 mmol/L   Potassium 4.8 3.5 - 5.1 mmol/L   Chloride 110 98 - 111 mmol/L   CO2 20 (L) 22 - 32 mmol/L   Glucose, Bld 106 (H) 70 - 99 mg/dL    Comment: Glucose reference range applies only to samples taken after fasting for at least 8 hours.   BUN 39 (H) 6 - 20 mg/dL   Creatinine, Ser 6.44 (H) 0.61 - 1.24 mg/dL   Calcium 9.0 8.9 - 03.4 mg/dL   GFR, Estimated 60 (L) >60 mL/min    Comment: (NOTE) Calculated using the CKD-EPI Creatinine Equation (2021)    Anion gap 9 5 - 15    Comment: Performed at [facility], [address] [place], [place] [address]  POCT INR     Status: Abnormal   Collection Time: 05/03/23 10:44 AM  Result Value Ref Range   INR 1.6 (A) 2.0 - 3.0   POC INR    CBC     Status: Abnormal   Collection Time: 05/12/23  9:40 AM  Result Value Ref Range   WBC 7.3 4.0 - 10.5 K/uL   RBC 2.66 (L) 4.22 - 5.81 MIL/uL   Hemoglobin 8.9 (L) 13.0 - 17.0 g/dL   HCT 56.3 (L) 87.5 - 64.3 %   MCV 103.8 (H) 80.0 - 100.0 fL   MCH 33.5 26.0 - 34.0 pg   MCHC 32.2 30.0 - 36.0 g/dL   RDW 32.9 (H) 51.8 - 84.1 %   Platelets 189 150 - 400 K/uL   nRBC 2.7 (H) 0.0 - 0.2 %    Comment: Performed at [facility], [address] [place], [place] [address]  POCT INR     Status: None   Collection Time: 05/17/23 10:02 AM  Result Value Ref Range   INR 2.2 2.0 - 3.0   POC INR    POCT INR     Status: Abnormal   Collection Time: 06/07/23 11:18 AM  Result Value Ref Range   INR 1.1 (A) 2.0 - 3.0   POC INR    Basic Metabolic Panel (BMET)     Status: None   Collection Time: 06/14/23 11:30 AM  Result Value Ref Range    Glucose 85 70 - 99 mg/dL   BUN 12 6 - 24 mg/dL   Creatinine, Ser 0.16 0.76 - 1.27 mg/dL   eGFR 010 >93 AT/FTD/3.22   BUN/Creatinine Ratio 15 9 - 20   Sodium 140 134 - 144 mmol/L   Potassium 4.1 3.5 - 5.2 mmol/L   Chloride 101 96 - 106 mmol/L   CO2 24 20 - 29 mmol/L   Calcium 9.8 8.7 - 10.2 mg/dL  B Nat Peptide     Status: None   Collection Time: 06/14/23 11:30 AM  Result Value Ref Range   BNP  30.9 0.0 - 100.0 pg/mL    Comment: Siemens ADVIA Centaur XP methodology  POCT INR     Status: Abnormal   Collection Time: 06/28/23  3:05 PM  Result Value Ref Range   INR 1.1 (A) 2.0 - 3.0   POC INR    POCT INR     Status: Abnormal   Collection Time: 07/12/23  3:12 PM  Result Value Ref Range   INR 1.2 (A) 2.0 - 3.0   POC INR    POCT INR     Status: Abnormal   Collection Time: 07/26/23  3:05 PM  Result Value Ref Range   INR 1.2 (A) 2.0 - 3.0   POC INR    Comp Met (CMET)     Status: Abnormal   Collection Time: 07/27/23  4:28 PM  Result Value Ref Range   Glucose 85 70 - 99 mg/dL   BUN 10 6 - 24 mg/dL   Creatinine, Ser 1.61 (L) 0.76 - 1.27 mg/dL   eGFR 096 >04 VW/UJW/1.19   BUN/Creatinine Ratio 14 9 - 20   Sodium 140 134 - 144 mmol/L   Potassium 3.7 3.5 - 5.2 mmol/L   Chloride 100 96 - 106 mmol/L   CO2 24 20 - 29 mmol/L   Calcium 10.1 8.7 - 10.2 mg/dL   Total Protein 7.0 6.0 - 8.5 g/dL   Albumin 4.2 3.8 - 4.9 g/dL   Globulin, Total 2.8 1.5 - 4.5 g/dL   Bilirubin Total <1.4 0.0 - 1.2 mg/dL   Alkaline Phosphatase 109 44 - 121 IU/L   AST 20 0 - 40 IU/L   ALT 15 0 - 44 IU/L  CBC w/Diff     Status: Abnormal   Collection Time: 07/27/23  4:28 PM  Result Value Ref Range   WBC 7.0 3.4 - 10.8 x10E3/uL   RBC 4.06 (L) 4.14 - 5.80 x10E6/uL   Hemoglobin 12.6 (L) 13.0 - 17.7 g/dL   Hematocrit 78.2 95.6 - 51.0 %   MCV 93 79 - 97 fL   MCH 31.0 26.6 - 33.0 pg   MCHC 33.5 31.5 - 35.7 g/dL   RDW 21.3 08.6 - 57.8 %   Platelets 211 150 - 450 x10E3/uL   Neutrophils 61 Not Estab. %   Lymphs 22  Not Estab. %   Monocytes 14 Not Estab. %   Eos 2 Not Estab. %   Basos 1 Not Estab. %   Neutrophils Absolute 4.3 1.4 - 7.0 x10E3/uL   Lymphocytes Absolute 1.5 0.7 - 3.1 x10E3/uL   Monocytes Absolute 1.0 (H) 0.1 - 0.9 x10E3/uL   EOS (ABSOLUTE) 0.1 0.0 - 0.4 x10E3/uL   Basophils Absolute 0.1 0.0 - 0.2 x10E3/uL   Immature Granulocytes 0 Not Estab. %   Immature Grans (Abs) 0.0 0.0 - 0.1 x10E3/uL  Lipid Profile     Status: Abnormal   Collection Time: 07/27/23  4:28 PM  Result Value Ref Range   Cholesterol, Total 166 100 - 199 mg/dL   Triglycerides 469 (H) 0 - 149 mg/dL   HDL 43 >62 mg/dL   VLDL Cholesterol Cal 28 5 - 40 mg/dL   LDL Chol Calc (NIH) 95 0 - 99 mg/dL   Chol/HDL Ratio 3.9 0.0 - 5.0 ratio    Comment:                                   T. Chol/HDL Ratio  Men  Women                               1/2 Avg.Risk  3.4    3.3                                   Avg.Risk  5.0    4.4                                2X Avg.Risk  9.6    7.1                                3X Avg.Risk 23.4   11.0   Magnesium     Status: None   Collection Time: 07/27/23  4:28 PM  Result Value Ref Range   Magnesium 1.7 1.6 - 2.3 mg/dL  Iron, TIBC and Ferritin Panel     Status: None   Collection Time: 07/27/23  4:28 PM  Result Value Ref Range   Total Iron Binding Capacity 293 250 - 450 ug/dL   UIBC 295 284 - 132 ug/dL   Iron 46 38 - 440 ug/dL   Iron Saturation 16 15 - 55 %   Ferritin 177 30 - 400 ng/mL  Vitamin B1     Status: None (Preliminary result)   Collection Time: 07/27/23  4:28 PM  Result Value Ref Range   Thiamine WILL FOLLOW   Folate     Status: None   Collection Time: 07/27/23  4:28 PM  Result Value Ref Range   Folate >20.0 >3.0 ng/mL    Comment: A serum folate concentration of less than 3.1 ng/mL is considered to represent clinical deficiency.      PHQ2/9:    07/27/2023    3:56 PM 04/25/2023   10:24 AM 02/28/2023    3:39 PM 12/26/2022     4:12 PM 11/24/2022    4:19 PM  Depression screen PHQ 2/9  Decreased Interest 0 0 1 0 2  Down, Depressed, Hopeless 0 0 0 0 1  PHQ - 2 Score 0 0 1 0 3  Altered sleeping 0 0 0 0 0  Tired, decreased energy 0 3 1 1 1   Change in appetite 0 0 1 0 0  Feeling bad or failure about yourself  0 0 0 0 0  Trouble concentrating 0 0 1 0 0  Moving slowly or fidgety/restless 0 3 3 1  0  Suicidal thoughts 0 0 0 0 0  PHQ-9 Score 0 6 7 2 4   Difficult doing work/chores Not difficult at all Somewhat difficult Extremely dIfficult Not difficult at all Somewhat difficult      Fall Risk:    07/27/2023    3:53 PM 04/25/2023   10:24 AM 02/28/2023    3:38 PM 12/26/2022    4:12 PM 11/24/2022    4:19 PM  Fall Risk   Falls in the past year? 0 1 1 0 1  Number falls in past yr: 0 1 1 0 1  Injury with Fall? 0 0 0 0 1  Risk for fall due to : No Fall Risks No Fall Risks History of fall(s) No Fall Risks History of fall(s);Impaired mobility  Follow up Falls evaluation completed Falls evaluation  completed Falls evaluation completed Falls evaluation completed Falls evaluation completed      Functional Status Survey:      Assessment & Plan  Problem List Items Addressed This Visit       Cardiovascular and Mediastinum   Hypertension    Chronic, historic condition Appears managed with Losartan 12.5 PO at bedtime, lasix 40 mg PO every day, spironolactone 12.5 mg pO every day, Metoprolol 25 mg PO BID Continue to follow with Cardiology - medications are managed by specialty  Follow up in 3 months or sooner if concerns arise        Relevant Orders   Comp Met (CMET) (Completed)   CBC w/Diff (Completed)   Chronic diastolic CHF (congestive heart failure) (HCC) - Primary    Chronic, historic condition Appears managed with Jardiance 10 mg PO every day, Lasix 40 mg PO every day  Cardiology appears to be monitoring and managing medications He denies excess fluid, chest pain at this time Continue current regimen and  Cardiology follow ups Follow up in 3 months or sooner if concerns arise        Persistent atrial fibrillation (HCC)    Chronic, ongoing Patient is followed by Coumadin clinic to manage coumadin monitoring and dosing Continue to follow up with them and Cardiology as scheduled      Relevant Medications   Magnesium Oxide -Mg Supplement (MAG-OXIDE) 200 MG TABS     Respiratory   COPD (chronic obstructive pulmonary disease) (HCC)    Chronic, ongoing  Patient is followed by Pulmonology  Was recently switched to [facility] and given prednisone burst for most recent flare He reports minimal improvement with Breztri switch and states he is almost out of medication- refill provided today to bridge to upcoming Pulm apt  Discussed with him that he should let them know the Markus Daft is not providing good relief He is using Albuterol inhaler about 2 times per day - unsure of neb use  Continue current regimen for now, will collaborate with Pulm for management  Follow up in 3 months or sooner if concerns arise        Relevant Medications   albuterol (VENTOLIN HFA) 108 (90 Base) MCG/ACT inhaler   Budeson-Glycopyrrol-Formoterol (BREZTRI AEROSPHERE) 160-9-4.8 MCG/ACT AERO     Nervous and Auditory   Epilepsy (HCC)    Chronic, historic condition His mother reports he is taking Carbamazepine daily as directed He has been evaluated by Neurology  in 05/17/23 and has upcoming follow up  They appear to be managing Carbamazepine at this time and recommend he continues taking 200 mg PO BID for now Will continue collaboration with them Follow up in 3 months or sooner if concerns arise          Other   Alcohol abuse    Chronic, ongoing Patient reports he is trying to cut down and is now drinking about 1/2 pint every two days Reviewed that alcohol cessation can be dangerous and can lead to severe health issues if not supervised  Recommend he continues very gradual reduction and has close follow up with PCP  and neurology to help prevent withdrawal complications Follow up in 3 months or sooner if concerns arise        Relevant Orders   Comp Met (CMET) (Completed)   CBC w/Diff (Completed)   Iron, TIBC and Ferritin Panel (Completed)   Vitamin B1 (Completed)   Folate (Completed)   Hypercholesterolemia    Chronic, ongoing Appears controlled with current regimen comprised of  rosuvastatin 20 mg PO every day  Continue current regimen  Recheck lipid panel today-Results to dictate further management  Follow up in 6 months or sooner if concerns arise        Relevant Orders   Lipid Profile (Completed)   Hypomagnesemia    Recheck levels today Results to dictate further management        Relevant Orders   Magnesium (Completed)     Return in about 3 months (around 10/26/2023) for HLD, HTN, COPD, epilepsy, ETOH .   I, Shavonta Gossen E Case Vassell, PA-C, have reviewed all documentation for this visit. The documentation on 07/28/23 for the exam, diagnosis, procedures, and orders are all accurate and complete.   Jacquelin Hawking, MHS, PA-C [facility]

## 2023-07-28 NOTE — Assessment & Plan Note (Signed)
Chronic, ongoing Patient is followed by Coumadin clinic to manage coumadin monitoring and dosing Continue to follow up with them and Cardiology as scheduled

## 2023-07-28 NOTE — Assessment & Plan Note (Signed)
Chronic, historic condition Appears managed with Jardiance 10 mg PO every day, Lasix 40 mg PO every day  Cardiology appears to be monitoring and managing medications He denies excess fluid, chest pain at this time Continue current regimen and Cardiology follow ups Follow up in 3 months or sooner if concerns arise

## 2023-07-28 NOTE — Assessment & Plan Note (Signed)
Chronic, ongoing Patient reports he is trying to cut down and is now drinking about 1/2 pint every two days Reviewed that alcohol cessation can be dangerous and can lead to severe health issues if not supervised  Recommend he continues very gradual reduction and has close follow up with PCP and neurology to help prevent withdrawal complications Follow up in 3 months or sooner if concerns arise

## 2023-07-28 NOTE — Assessment & Plan Note (Signed)
Chronic, historic condition Appears managed with Losartan 12.5 PO at bedtime, lasix 40 mg PO every day, spironolactone 12.5 mg pO every day, Metoprolol 25 mg PO BID Continue to follow with Cardiology - medications are managed by specialty  Follow up in 3 months or sooner if concerns arise

## 2023-07-28 NOTE — Assessment & Plan Note (Signed)
Chronic, historic condition His mother reports he is taking Carbamazepine daily as directed He has been evaluated by Neurology  in 05/17/23 and has upcoming follow up  They appear to be managing Carbamazepine at this time and recommend he continues taking 200 mg PO BID for now Will continue collaboration with them Follow up in 3 months or sooner if concerns arise

## 2023-07-28 NOTE — Assessment & Plan Note (Signed)
Chronic, ongoing  Patient is followed by Pulmonology  Was recently switched to [facility] and given prednisone burst for most recent flare He reports minimal improvement with Markus Daft switch and states he is almost out of medication- refill provided today to bridge to upcoming Pulm apt  Discussed with him that he should let them know the Markus Daft is not providing good relief He is using Albuterol inhaler about 2 times per day - unsure of neb use  Continue current regimen for now, will collaborate with Pulm for management  Follow up in 3 months or sooner if concerns arise

## 2023-07-28 NOTE — Progress Notes (Signed)
Your labs are back Your electrolytes, liver and kidney function were overall normal at this time Your CBC does show mild anemia but this is improving from your testing 2 months ago  Your cholesterol looks good- please continue your medications  Your Magnesium is in normal range Your iron panel was normal and your folate was above goal  We are still waiting on results of your thiamine and will keep you updated once they are back  For now please continue taking your medications as instructed and let us know if you have concerns

## 2023-07-28 NOTE — Assessment & Plan Note (Signed)
Recheck levels today Results to dictate further management

## 2023-07-28 NOTE — Assessment & Plan Note (Signed)
Chronic, ongoing Appears controlled with current regimen comprised of rosuvastatin 20 mg PO every day  Continue current regimen  Recheck lipid panel today-Results to dictate further management  Follow up in 6 months or sooner if concerns arise

## 2023-07-31 LAB — COMPREHENSIVE METABOLIC PANEL
ALT: 15 IU/L (ref 0–44)
AST: 20 IU/L (ref 0–40)
Albumin: 4.2 g/dL (ref 3.8–4.9)
Alkaline Phosphatase: 109 IU/L (ref 44–121)
BUN/Creatinine Ratio: 14 (ref 9–20)
BUN: 10 mg/dL (ref 6–24)
Bilirubin Total: <0.2 mg/dL (ref 0.0–1.2)
CO2: 24 mmol/L (ref 20–29)
Calcium: 10.1 mg/dL (ref 8.7–10.2)
Chloride: 100 mmol/L (ref 96–106)
Creatinine, Ser: 0.72 mg/dL — ABNORMAL LOW (ref 0.76–1.27)
Globulin, Total: 2.8 g/dL (ref 1.5–4.5)
Glucose: 85 mg/dL (ref 70–99)
Potassium: 3.7 mmol/L (ref 3.5–5.2)
Sodium: 140 mmol/L (ref 134–144)
Total Protein: 7 g/dL (ref 6.0–8.5)
eGFR: 109 mL/min/{1.73_m2} (ref >59)

## 2023-07-31 LAB — CBC WITH DIFFERENTIAL/PLATELET
Basophils Absolute: 0.1 10*3/uL (ref 0.0–0.2)
Basos: 1 %
EOS (ABSOLUTE): 0.1 10*3/uL (ref 0.0–0.4)
Eos: 2 %
Hematocrit: 37.6 % (ref 37.5–51.0)
Hemoglobin: 12.6 g/dL — ABNORMAL LOW (ref 13.0–17.7)
Immature Grans (Abs): 0 10*3/uL (ref 0.0–0.1)
Immature Granulocytes: 0 %
Lymphocytes Absolute: 1.5 10*3/uL (ref 0.7–3.1)
Lymphs: 22 %
MCH: 31 pg (ref 26.6–33.0)
MCHC: 33.5 g/dL (ref 31.5–35.7)
MCV: 93 fL (ref 79–97)
Monocytes Absolute: 1 10*3/uL — ABNORMAL HIGH (ref 0.1–0.9)
Monocytes: 14 %
Neutrophils Absolute: 4.3 10*3/uL (ref 1.4–7.0)
Neutrophils: 61 %
Platelets: 211 10*3/uL (ref 150–450)
RBC: 4.06 x10E6/uL — ABNORMAL LOW (ref 4.14–5.80)
RDW: 12.8 % (ref 11.6–15.4)
WBC: 7 10*3/uL (ref 3.4–10.8)

## 2023-07-31 LAB — VITAMIN B1: Thiamine: 109.4 nmol/L (ref 66.5–200.0)

## 2023-07-31 LAB — FOLATE: Folate: >20 ng/mL (ref >3.0)

## 2023-07-31 LAB — IRON,TIBC AND FERRITIN PANEL
Ferritin: 177 ng/mL (ref 30–400)
Iron Saturation: 16 % (ref 15–55)
Iron: 46 ug/dL (ref 38–169)
Total Iron Binding Capacity: 293 ug/dL (ref 250–450)
UIBC: 247 ug/dL (ref 111–343)

## 2023-07-31 LAB — LIPID PANEL
Chol/HDL Ratio: 3.9 ratio (ref 0.0–5.0)
Cholesterol, Total: 166 mg/dL (ref 100–199)
HDL: 43 mg/dL (ref >39)
LDL Chol Calc (NIH): 95 mg/dL (ref 0–99)
Triglycerides: 158 mg/dL — ABNORMAL HIGH (ref 0–149)
VLDL Cholesterol Cal: 28 mg/dL (ref 5–40)

## 2023-07-31 LAB — MAGNESIUM: Magnesium: 1.7 mg/dL (ref 1.6–2.3)

## 2023-08-02 NOTE — Progress Notes (Signed)
Thiamine was in normal range

## 2023-08-07 ENCOUNTER — Other Ambulatory Visit: Payer: Self-pay

## 2023-08-07 ENCOUNTER — Telehealth: Payer: Self-pay | Admitting: *Deleted

## 2023-08-07 DIAGNOSIS — I4819 Other persistent atrial fibrillation: Secondary | ICD-10-CM

## 2023-08-07 MED ORDER — WARFARIN SODIUM 5 MG PO TABS
ORAL_TABLET | ORAL | 3 refills | Status: DC
Start: 2023-08-07 — End: 2023-08-07

## 2023-08-07 MED ORDER — WARFARIN SODIUM 5 MG PO TABS
ORAL_TABLET | ORAL | 1 refills | Status: DC
Start: 2023-08-07 — End: 2023-08-23

## 2023-08-07 NOTE — Telephone Encounter (Signed)
*  STAT* If patient is at the pharmacy, call can be transferred to refill team.   1. Which medications need to be refilled? (please list name of each medication and dose if known) warfarin (COUMADIN) 5 MG tablet   2. Which pharmacy/location (including street and city if local pharmacy) is medication to be sent to? [facility] - [place] ([place]), [place] - [address]   3. Do they need a 30 day or 90 day supply?  90 day supply  Patient's mother states the patient is completely out of medication and the pharmacy will not distribute current prescription until 10/04 due to insurance. Patient's mother states Jose [name] advised the patient to take Warfarin 5 MG twice daily - 2 times per week (Monday and Friday). Mother states this caused patient to run out of medication early. She would like to know if the prescription can be increased per instructions from [employer].

## 2023-08-08 DIAGNOSIS — Z419 Encounter for procedure for purposes other than remedying health state, unspecified: Secondary | ICD-10-CM | POA: Diagnosis not present

## 2023-08-09 ENCOUNTER — Ambulatory Visit: Payer: Medicaid Other | Attending: Internal Medicine

## 2023-08-09 DIAGNOSIS — Z7901 Long term (current) use of anticoagulants: Secondary | ICD-10-CM

## 2023-08-09 DIAGNOSIS — I4819 Other persistent atrial fibrillation: Secondary | ICD-10-CM

## 2023-08-09 LAB — POCT INR: INR: 1.1 — AB (ref 2.0–3.0)

## 2023-08-09 NOTE — Patient Instructions (Signed)
TAKE 2 TABLETS TODAY AND THURSDAY THEN CONTINUE  1 tablet Daily, except 2 tablets every Monday and Friday Recheck in 2 weeks. Please call the [facility] with any questions at [phone]

## 2023-08-14 ENCOUNTER — Encounter: Payer: Self-pay | Admitting: Nurse Practitioner

## 2023-08-14 ENCOUNTER — Telehealth (INDEPENDENT_AMBULATORY_CARE_PROVIDER_SITE_OTHER): Payer: Medicaid Other | Admitting: Nurse Practitioner

## 2023-08-14 DIAGNOSIS — G4734 Idiopathic sleep related nonobstructive alveolar hypoventilation: Secondary | ICD-10-CM

## 2023-08-14 DIAGNOSIS — F17200 Nicotine dependence, unspecified, uncomplicated: Secondary | ICD-10-CM | POA: Diagnosis not present

## 2023-08-14 DIAGNOSIS — J449 Chronic obstructive pulmonary disease, unspecified: Secondary | ICD-10-CM | POA: Diagnosis not present

## 2023-08-14 DIAGNOSIS — J441 Chronic obstructive pulmonary disease with (acute) exacerbation: Secondary | ICD-10-CM

## 2023-08-14 MED ORDER — TRELEGY ELLIPTA 100-62.5-25 MCG/ACT IN AEPB
1.0000 | INHALATION_SPRAY | Freq: Every day | RESPIRATORY_TRACT | 5 refills | Status: DC
Start: 2023-08-14 — End: 2023-09-06

## 2023-08-14 NOTE — Progress Notes (Deleted)
@Patient  ID: Jose [name]., male    DOB: Nov 29, 1970, 52 y.o.   MRN: [id number]  Chief Complaint  Patient presents with   Follow-up    Pt is here for COPD F/U visit. Pt has no abnormal SOB or Coughing.    Referring provider: [name], NP  HPI:   TEST/EVENTS:   No Known Allergies  Immunization History  Administered Date(s) Administered   Influenza,inj,Quad PF,6+ Mos 08/17/2022   Influenza-Unspecified 08/22/2019   PFIZER(Purple Top)SARS-COV-2 Vaccination 09/14/2020, 10/19/2020   PNEUMOCOCCAL CONJUGATE-20 08/26/2022    Past Medical History:  Diagnosis Date   Alcohol abuse    Cervicalgia    CHF (congestive heart failure) (HCC)    COPD (chronic obstructive pulmonary disease) (HCC)    Hyperlipidemia    Hypertension    Seizures (HCC)     Tobacco History: Social History   Tobacco Use  Smoking Status Every Day   Average packs/day: 1.5 packs/day for 30.0 years (45.0 ttl pk-yrs)   Types: Cigarettes, Cigars   Start date: 73   Last attempt to quit: 2021   Years since quitting: 3.7  Smokeless Tobacco Former  Tobacco Comments   2 cigars a day- 06/30/2023 khj   Quit smoking cigarettes 3 years ago.  Smokes cigarettes since he was 52 years old.   Ready to quit: Not Answered Counseling given: Not Answered Tobacco comments: 2 cigars a day- 06/30/2023 khj Quit smoking cigarettes 3 years ago.  Smokes cigarettes since he was 52 years old.   Outpatient Medications Prior to Visit  Medication Sig Dispense Refill   albuterol (VENTOLIN HFA) 108 (90 Base) MCG/ACT inhaler Inhale 2 puffs into the lungs every 6 (six) hours as needed for wheezing or shortness of breath. 8 g 2   Budeson-Glycopyrrol-Formoterol (BREZTRI AEROSPHERE) 160-9-4.8 MCG/ACT AERO Inhale 2 puffs into the lungs in the morning and at bedtime. 10.7 g 1   carbamazepine (TEGRETOL) 200 MG tablet Take 1 tablet (200 mg total) by mouth 2 (two) times daily. 180 tablet 1   empagliflozin (JARDIANCE) 10 MG TABS tablet  Take 1 tablet (10 mg total) by mouth daily before breakfast. 90 tablet 1   ferrous sulfate (SV IRON) 325 (65 FE) MG tablet Take 1 tablet by mouth once daily 90 tablet 3   folic acid (FOLVITE) 1 MG tablet Take 1 tablet (1 mg total) by mouth daily. 90 tablet 0   furosemide (LASIX) 40 MG tablet Take 1 tablet (40 mg total) by mouth every morning. 30 tablet 3   losartan (COZAAR) 25 MG tablet Take 0.5 tablets (12.5 mg total) by mouth at bedtime. 45 tablet 3   Magnesium Oxide -Mg Supplement (MAG-OXIDE) 200 MG TABS Take 1 tablet (200 mg total) by mouth 2 (two) times daily. 180 tablet 1   metoprolol tartrate (LOPRESSOR) 25 MG tablet Take 1 tablet (25 mg total) by mouth 2 (two) times daily. 60 tablet 3   potassium chloride (KLOR-CON) 10 MEQ tablet Take 20 mEq by mouth daily.     rosuvastatin (CRESTOR) 20 MG tablet Take 1 tablet (20 mg total) by mouth daily. 90 tablet 3   spironolactone (ALDACTONE) 25 MG tablet Take 0.5 tablets (12.5 mg total) by mouth daily. 15 tablet 6   warfarin (COUMADIN) 5 MG tablet Take 1-2 tablets Daily or as prescribed by Coumadin Clinic. 100 tablet 1   No facility-administered medications prior to visit.     Review of Systems:   Constitutional: No weight loss or gain, night sweats, fevers, chills, fatigue,  or lassitude. HEENT: No headaches, difficulty swallowing, tooth/dental problems, or sore throat. No sneezing, itching, ear ache, nasal congestion, or post nasal drip CV:  No chest pain, orthopnea, PND, swelling in lower extremities, anasarca, dizziness, palpitations, syncope Resp: No shortness of breath with exertion or at rest. No excess mucus or change in color of mucus. No productive or non-productive. No hemoptysis. No wheezing.  No chest wall deformity GI:  No heartburn, indigestion, abdominal pain, nausea, vomiting, diarrhea, change in bowel habits, loss of appetite, bloody stools.  GU: No dysuria, change in color of urine, urgency or frequency.  No flank pain, no  hematuria  Skin: No rash, lesions, ulcerations MSK:  No joint pain or swelling.  No decreased range of motion.  No back pain. Neuro: No dizziness or lightheadedness.  Psych: No depression or anxiety. Mood stable.     Physical Exam:  There were no vitals taken for this visit.  GEN: Pleasant, interactive, well-nourished/chronically-ill appearing/acutely-ill appearing/poorly-nourished/morbidly obese; in no acute distress.****** HEENT:  Normocephalic and atraumatic. EACs patent bilaterally. TM pearly gray with present light reflex bilaterally. PERRLA. Sclera white. Nasal turbinates pink, moist and patent bilaterally. No rhinorrhea present. Oropharynx pink and moist, without exudate or edema. No lesions, ulcerations, or postnasal drip.  NECK:  Supple w/ fair ROM. No JVD present. Normal carotid impulses w/o bruits. Thyroid symmetrical with no goiter or nodules palpated. No lymphadenopathy.   CV: RRR, no m/r/g, no peripheral edema. Pulses intact, +2 bilaterally. No cyanosis, pallor or clubbing. PULMONARY:  Unlabored, regular breathing. Clear bilaterally A&P w/o wheezes/rales/rhonchi. No accessory muscle use.  GI: BS present and normoactive. Soft, non-tender to palpation. No organomegaly or masses detected. No CVA tenderness. MSK: No erythema, warmth or tenderness. Cap refil <2 sec all extrem. No deformities or joint swelling noted.  Neuro: A/Ox3. No focal deficits noted.   Skin: Warm, no lesions or rashe Psych: Normal affect and behavior. Judgement and thought content appropriate.     Lab Results:  CBC    Component Value Date/Time   WBC 7.0 07/27/2023 1628   WBC 7.3 05/12/2023 0940   RBC 4.06 (L) 07/27/2023 1628   RBC 2.66 (L) 05/12/2023 0940   HGB 12.6 (L) 07/27/2023 1628   HCT 37.6 07/27/2023 1628   PLT 211 07/27/2023 1628   MCV 93 07/27/2023 1628   MCH 31.0 07/27/2023 1628   MCH 33.5 05/12/2023 0940   MCHC 33.5 07/27/2023 1628   MCHC 32.2 05/12/2023 0940   RDW 12.8 07/27/2023  1628   LYMPHSABS 1.5 07/27/2023 1628   MONOABS 0.8 11/16/2022 0321   EOSABS 0.1 07/27/2023 1628   BASOSABS 0.1 07/27/2023 1628    BMET    Component Value Date/Time   NA 140 07/27/2023 1628   K 3.7 07/27/2023 1628   CL 100 07/27/2023 1628   CO2 24 07/27/2023 1628   GLUCOSE 85 07/27/2023 1628   GLUCOSE 106 (H) 05/01/2023 1115   BUN 10 07/27/2023 1628   CREATININE 0.72 (L) 07/27/2023 1628   CALCIUM 10.1 07/27/2023 1628   GFRNONAA 60 (L) 05/01/2023 1115   GFRAA >60 05/16/2020 2250    BNP    Component Value Date/Time   BNP 30.9 06/14/2023 1130   BNP 27.2 04/26/2023 1132     Imaging:  No results found.  Administration History     None          Latest Ref Rng & Units 03/14/2023   10:21 AM 04/27/2022   10:45 AM  PFT Results  FVC-Pre L  2.34  2.39   FVC-Predicted Pre % 46  57   FVC-Post L 1.65  1.95   FVC-Predicted Post % 33  46   Pre FEV1/FVC % % 47  60   Post FEV1/FCV % % 45  69   FEV1-Pre L 1.10  1.43   FEV1-Predicted Pre % 28  43   FEV1-Post L 0.74  1.35   DLCO uncorrected ml/min/mmHg  11.42   DLCO UNC% %  39   DLVA Predicted %  78   TLC L  4.93   TLC % Predicted %  70   RV % Predicted %  130     No results found for: "NITRICOXIDE"      Assessment & Plan:   No problem-specific Assessment & Plan notes found for this encounter.   I spent *** minutes of dedicated to the care of this patient on the date of this encounter to include pre-visit review of records, face-to-face time with the patient discussing conditions above, post visit ordering of testing, clinical documentation with the electronic health record, making appropriate referrals as documented, and communicating necessary findings to members of the patients care team.  Noemi Chapel, NP 08/14/2023  Pt aware and understands NP's role.

## 2023-08-14 NOTE — Assessment & Plan Note (Signed)
Stable on current regimen. No exertional requirement. Walk test at follow up.

## 2023-08-14 NOTE — Patient Instructions (Addendum)
Continue Albuterol inhaler 2 puffs or 3 mL neb every 6 hours as needed for shortness of breath or wheezing. Notify if symptoms persist despite rescue inhaler/neb use. Use nebs twice a day before your inhaler and then follow with flutter valve 10 times Ok to go back to Trelegy 1 puff daily. Brush tongue and rinse mouth afterwards Continue supplemental oxygen 2 L/min at night Guaifenesin 600 mg Twice daily for cough/congestion (over the counter medication; Mucinex brand or alternative brand is fine) Start Ohtuvayre nebs Twice daily. Go to the [place] office to sign the paperwork for this and pickup information packet.   Encourage you to stop smoking!!   Attend pulmonary rehab once scheduled  Attend lung cancer screening CT as scheduled    Follow up in 8 weeks with [name] or Katie Unnamed Zeien,NP. If symptoms do not improve or worsen, please contact office for sooner follow up or seek emergency care.

## 2023-08-14 NOTE — Progress Notes (Signed)
Agree with the details of the visit as noted by Katherine Cobb, NP. ° °C. Laura Yasin Ducat, MD °Advanced Bronchoscopy °[facility] ° °

## 2023-08-14 NOTE — Progress Notes (Signed)
Patient ID: Jose [name]., male     DOB: [birth year], 52 y.o.      MRN: [id number]  Chief Complaint  Patient presents with   Follow-up    Pt is here for COPD F/U visit. Pt has no abnormal SOB or Coughing.    Virtual Visit via Video Note  I connected with Jose [name]. on 08/14/23 at  8:30 AM EDT by a video enabled telemedicine application and verified that I am speaking with the correct person using two identifiers.  Location: Patient: Home Provider: Office   I discussed the limitations of evaluation and management by telemedicine and the availability of in person appointments. The patient expressed understanding and agreed to proceed.  History of Present Illness: 52 year old male, active cigar smoker and former cigarette smoker followed for severe COPD and nocturnal hypoxemia.  He is patient [name] and last seen in office 06/30/2023.  Past medical history significant for hypertension, CHF, A-fib on Coumadin, seizure disorder, alcohol abuse, prediabetes.   TEST/EVENTS:  04/27/2022 PFT: FVC 57, FEV1 43, ratio 69, TLC 70, DLCO 39 03/14/2023 PFT: FVC 46, FEV1 28, ratio 45   05/05/2022: OV with [name].  Initially seen for consult February 18, 2022.  He had been started on supplemental oxygen after discharge from the hospital.  He was given Trelegy which he states helps however he does not use this every day.  He instead relies on albuterol several times a day.  Could not explain why he is not taking his Trelegy daily.  He continues to drink approximately half pint of liquor per day, sometimes more.  Had PFTs on 04/27/2022 that showed stage III COPD.  Addressed concerns regarding patient smoking and drinking.  He was referred to neurology at a previous visit to manage his antiseizure medications but he never made the appointment.  Patient's mother stated that she would ensure that he does this.  Having some mild memory impairment, likely due to daily alcohol intake.  Advised to resume  Trelegy daily.  Counseled on discontinuing alcohol/seeking counseling.  Smoking cessation also advised.  Referred to lung cancer screening program.  Referred back to neurology again.   06/30/2023: Ov with Druanne Bosques NP for follow-up with his mother.  Since he was seen last, he was hospitalized in January for seizure and new onset atrial fibrillation.  He has been having ongoing workup with cardiology since then.  He was then hospitalized again in June 2024 for GI bleed, anemia, AKI.  He has recovered since then and is feeling more so back to his baseline.  He does tell me that he has had a lot more trouble with his breathing over the last year or so.  Feels like he cannot do much of anything without getting out of breath.  He does have a daily productive cough with white phlegm.  Having some occasional congestion.  He does  noticed some wheezing.  Denies any fevers, chills, hemoptysis, lower extremity swelling, orthopnea, weight gain, PND, chest pain, palpitations.  He is using his Trelegy daily.  Using albuterol a few times a day as well.  Does not have a nebulizer machine at home.  Never had lung cancer screening CT.  He still smoking 2 cigars a day.  Quit smoking cigarettes 3 years ago.  Last pulmonary function testing showed a decline in lung function with FEV1 from 43% to 28%; however the patient did have difficulty comprehending testing instruction and performing necessary components.  Testing did not  meet the ATS criteria. Still drinking alcohol daily.  08/14/2023: Today - follow up Patient presents today for follow up. He feels relatively unchanged compared to his last visit. He hasn't noticed a huge difference with the change to [place]. He still has a daily, productive cough and chest congestion. Not wheezing. No fevers, chills, hemoptysis, leg swelling, orthopnea. He uses his albuterol 1-2 times a day. Has upcoming lung cancer screening this week. He is still smoking 2 cigars a day and drinking daily.   No  Known Allergies Immunization History  Administered Date(s) Administered   Influenza,inj,Quad PF,6+ Mos 08/17/2022   Influenza-Unspecified 08/22/2019   PFIZER(Purple Top)SARS-COV-2 Vaccination 09/14/2020, 10/19/2020   PNEUMOCOCCAL CONJUGATE-20 08/26/2022   Past Medical History:  Diagnosis Date   Alcohol abuse    Cervicalgia    CHF (congestive heart failure) (HCC)    COPD (chronic obstructive pulmonary disease) (HCC)    Hyperlipidemia    Hypertension    Seizures (HCC)     Tobacco History: Social History   Tobacco Use  Smoking Status Every Day   Average packs/day: 1.5 packs/day for 30.0 years (45.0 ttl pk-yrs)   Types: Cigarettes, Cigars   Start date: 25   Last attempt to quit: 2021   Years since quitting: 3.7  Smokeless Tobacco Former  Tobacco Comments   2 cigars a day- 06/30/2023 khj   Quit smoking cigarettes 3 years ago.  Smokes cigarettes since he was 52 years old.   Ready to quit: Not Answered Counseling given: Not Answered Tobacco comments: 2 cigars a day- 06/30/2023 khj Quit smoking cigarettes 3 years ago.  Smokes cigarettes since he was 52 years old.   Outpatient Medications Prior to Visit  Medication Sig Dispense Refill   albuterol (VENTOLIN HFA) 108 (90 Base) MCG/ACT inhaler Inhale 2 puffs into the lungs every 6 (six) hours as needed for wheezing or shortness of breath. 8 g 2   carbamazepine (TEGRETOL) 200 MG tablet Take 1 tablet (200 mg total) by mouth 2 (two) times daily. 180 tablet 1   empagliflozin (JARDIANCE) 10 MG TABS tablet Take 1 tablet (10 mg total) by mouth daily before breakfast. 90 tablet 1   ferrous sulfate (SV IRON) 325 (65 FE) MG tablet Take 1 tablet by mouth once daily 90 tablet 3   folic acid (FOLVITE) 1 MG tablet Take 1 tablet (1 mg total) by mouth daily. 90 tablet 0   furosemide (LASIX) 40 MG tablet Take 1 tablet (40 mg total) by mouth every morning. 30 tablet 3   losartan (COZAAR) 25 MG tablet Take 0.5 tablets (12.5 mg total) by mouth at  bedtime. 45 tablet 3   Magnesium Oxide -Mg Supplement (MAG-OXIDE) 200 MG TABS Take 1 tablet (200 mg total) by mouth 2 (two) times daily. 180 tablet 1   metoprolol tartrate (LOPRESSOR) 25 MG tablet Take 1 tablet (25 mg total) by mouth 2 (two) times daily. 60 tablet 3   potassium chloride (KLOR-CON) 10 MEQ tablet Take 20 mEq by mouth daily.     rosuvastatin (CRESTOR) 20 MG tablet Take 1 tablet (20 mg total) by mouth daily. 90 tablet 3   spironolactone (ALDACTONE) 25 MG tablet Take 0.5 tablets (12.5 mg total) by mouth daily. 15 tablet 6   warfarin (COUMADIN) 5 MG tablet Take 1-2 tablets Daily or as prescribed by [facility] Clinic. 100 tablet 1   Budeson-Glycopyrrol-Formoterol (BREZTRI AEROSPHERE) 160-9-4.8 MCG/ACT AERO Inhale 2 puffs into the lungs in the morning and at bedtime. 10.7 g 1   No  facility-administered medications prior to visit.     Review of Systems:   Constitutional: No weight loss or gain, night sweats, fevers, chills, or lassitude. +fatigue (baseline) HEENT: No headaches, difficulty swallowing, tooth/dental problems, or sore throat. No sneezing, itching, ear ache, nasal congestion, or post nasal drip CV:  No chest pain, orthopnea, PND, swelling in lower extremities, anasarca, dizziness, palpitations, syncope Resp: +shortness of breath with exertion; chest congestion; daily cough; wheezing. No excess mucus or change in color of mucus. No hemoptysis. No chest wall deformity GI:  No heartburn, indigestion, abdominal pain, nausea, vomiting, diarrhea, change in bowel habits, loss of appetite, bloody stools.  GU: No dysuria, change in color of urine, urgency or frequency.   Skin: No rash, lesions, ulcerations MSK:  No joint pain or swelling.   Neuro: No dizziness or lightheadedness.  Psych: No depression or anxiety. Mood stable.   Observations/Objective: Patient is well-developed, well-nourished in no acute distress. A&Ox3. Resting comfortably at home. Unlabored breathing. Speech is  clear and coherent with logical content.    Assessment and Plan: COPD (chronic obstructive pulmonary disease) (HCC) Very severe COPD with high symptom burden and chronic bronchitis symptoms.  Bronchospasm seems to be better after previous prednisone course but no significant change in his dyspnea or daily cough.  Does not appear to be in acute exacerbation today.  He tells me that he did not have any noticeable difference with change from Trelegy to [place].  Okay to resume Trelegy once daily.  Given persistent, chronic bronchitis symptoms, we will start him on inhaled phosphodiesterase inhibitor with Ohtuvayre.  Side effect profile of medication education reviewed.  Will reassess response at follow-up.  Smoking cessation strongly advised.  Encouraged to attend pulmonary rehab once scheduled. Action plan in place.   Patient Instructions  Continue Albuterol inhaler 2 puffs or 3 mL neb every 6 hours as needed for shortness of breath or wheezing. Notify if symptoms persist despite rescue inhaler/neb use. Use nebs twice a day before your inhaler and then follow with flutter valve 10 times Ok to go back to Trelegy 1 puff daily. Brush tongue and rinse mouth afterwards Continue supplemental oxygen 2 L/min at night Guaifenesin 600 mg Twice daily for cough/congestion (over the counter medication; Mucinex brand or alternative brand is fine) Start Ohtuvayre nebs Twice daily. Go to the [place] office to sign the paperwork for this and pickup information packet.   Encourage you to stop smoking!!   Attend pulmonary rehab once scheduled  Attend lung cancer screening CT as scheduled    Follow up in 8 weeks with [name] or Katie Yates Weisgerber,NP. If symptoms do not improve or worsen, please contact office for sooner follow up or seek emergency care.   Nocturnal hypoxemia Stable on current regimen. No exertional requirement. Walk test at follow up.   Tobacco dependence Quit smoking cigarettes 3 years ago.  Still smokes 2 cigars a day. Smoking cessation advised.    I discussed the assessment and treatment plan with the patient. The patient was provided an opportunity to ask questions and all were answered. The patient agreed with the plan and demonstrated an understanding of the instructions.   The patient was advised to call back or seek an in-person evaluation if the symptoms worsen or if the condition fails to improve as anticipated.  I provided 32 minutes of non-face-to-face time during this encounter.   Noemi Chapel, NP

## 2023-08-14 NOTE — Assessment & Plan Note (Addendum)
Very severe COPD with high symptom burden and chronic bronchitis symptoms.  Bronchospasm seems to be better after previous prednisone course but no significant change in his dyspnea or daily cough.  Does not appear to be in acute exacerbation today.  He tells me that he did not have any noticeable difference with change from Trelegy to [place].  Okay to resume Trelegy once daily.  Given persistent, chronic bronchitis symptoms, we will start him on inhaled phosphodiesterase inhibitor with Ohtuvayre.  Side effect profile of medication education reviewed.  Will reassess response at follow-up.  Smoking cessation strongly advised.  Encouraged to attend pulmonary rehab once scheduled. Action plan in place.   Patient Instructions  Continue Albuterol inhaler 2 puffs or 3 mL neb every 6 hours as needed for shortness of breath or wheezing. Notify if symptoms persist despite rescue inhaler/neb use. Use nebs twice a day before your inhaler and then follow with flutter valve 10 times Ok to go back to Trelegy 1 puff daily. Brush tongue and rinse mouth afterwards Continue supplemental oxygen 2 L/min at night Guaifenesin 600 mg Twice daily for cough/congestion (over the counter medication; Mucinex brand or alternative brand is fine) Start Ohtuvayre nebs Twice daily. Go to the [name] office to sign the paperwork for this and pickup information packet.   Encourage you to stop smoking!!   Attend pulmonary rehab once scheduled  Attend lung cancer screening CT as scheduled    Follow up in 8 weeks with [name] or [name],NP. If symptoms do not improve or worsen, please contact office for sooner follow up or seek emergency care.

## 2023-08-14 NOTE — Assessment & Plan Note (Signed)
Quit smoking cigarettes 3 years ago. Still smokes 2 cigars a day. Smoking cessation advised.

## 2023-08-15 ENCOUNTER — Ambulatory Visit: Payer: Medicaid Other | Admitting: Acute Care

## 2023-08-15 ENCOUNTER — Encounter: Payer: Self-pay | Admitting: Acute Care

## 2023-08-15 DIAGNOSIS — Z720 Tobacco use: Secondary | ICD-10-CM | POA: Diagnosis not present

## 2023-08-15 DIAGNOSIS — Z87891 Personal history of nicotine dependence: Secondary | ICD-10-CM

## 2023-08-15 DIAGNOSIS — Z122 Encounter for screening for malignant neoplasm of respiratory organs: Secondary | ICD-10-CM | POA: Diagnosis not present

## 2023-08-15 NOTE — Patient Instructions (Signed)

## 2023-08-15 NOTE — Progress Notes (Signed)
Virtual Visit via Telephone Note  I connected with Jose [name]. on 08/15/23 at  3:30 PM EDT by telephone and verified that I am speaking with the correct person using two identifiers.  Location: Patient:  At home Provider:  [address], [place], [place], [address]    I discussed the limitations, risks, security and privacy concerns of performing an evaluation and management service by telephone and the availability of in person appointments. I also discussed with the patient that there may be a patient responsible charge related to this service. The patient expressed understanding and agreed to proceed.    Shared Decision Making Visit Lung Cancer Screening Program [phone])   Eligibility: Age 53 y.o. Pack Years Smoking History Calculation 51 pack year smoking history (# packs/per year x # years smoked) Recent History of coughing up blood  no Unexplained weight loss? no ( >Than 15 pounds within the last 6 months ) Prior History Lung / other cancer no (Diagnosis within the last 5 years already requiring surveillance chest CT Scans). Smoking Status Former Smoker Former Smokers: Years since quit: 3 years  Quit Date: 2021  Visit Components: Discussion included one or more decision making aids. yes Discussion included risk/benefits of screening. yes Discussion included potential follow up diagnostic testing for abnormal scans. yes Discussion included meaning and risk of over diagnosis. yes Discussion included meaning and risk of False Positives. yes Discussion included meaning of total radiation exposure. yes  Counseling Included: Importance of adherence to annual lung cancer LDCT screening. yes Impact of comorbidities on ability to participate in the program. yes Ability and willingness to under diagnostic treatment. yes  Smoking Cessation Counseling: Current Smokers:  Discussed importance of smoking cessation. yes Information about tobacco cessation classes and  interventions provided to patient. yes Patient provided with "ticket" for LDCT Scan. yes Symptomatic Patient. no  Counseling NA Diagnosis Code: Tobacco Use Z72.0 Asymptomatic Patient yes  Counseling (Intermediate counseling: > three minutes counseling) U0454 Former Smokers:  Discussed the importance of maintaining cigarette abstinence. yes Diagnosis Code: Personal History of Nicotine Dependence. U98.119 Information about tobacco cessation classes and interventions provided to patient. Yes Patient provided with "ticket" for LDCT Scan. yes Written Order for Lung Cancer Screening with LDCT placed in Epic. Yes (CT Chest Lung Cancer Screening Low Dose W/O CM) [id number] Z12.2-Screening of respiratory organs Z87.891-Personal history of nicotine dependence  I spent 25 minutes of face to face time/virtual visit time  with  Jose [name] discussing the risks and benefits of lung cancer screening. We took the time to pause the power point at intervals to allow for questions to be asked and answered to ensure understanding. We discussed that he had taken the single most powerful action possible to decrease his risk of developing lung cancer when he quit smoking. I counseled him to remain smoke free, and to contact me if he ever had the desire to smoke again so that I can provide resources and tools to help support the effort to remain smoke free. We discussed the time and location of the scan, and that either  Jose Miyamoto RN, Jose Lemon, RN or I  or I will call / send a letter with the results within  24-72 hours of receiving them. He has the office contact information in the event he needs to speak with me,  he verbalized understanding of all of the above and had no further questions upon leaving the office.     I explained to the patient  that there has been a high incidence of coronary artery disease noted on these exams. I explained that this is a non-gated exam therefore degree or severity cannot be  determined. This patient is on statin therapy. I have asked the patient to follow-up with their PCP regarding any incidental finding of coronary artery disease and management with diet or medication as they feel is clinically indicated. The patient verbalized understanding of the above and had no further questions.  Pt. Quit smoking cigarettes 2021.  He continues to smoke daily cigars, which are not considered for lung cancer screening, but are a tobacco product, so he was counseled on quitting x 3 minutes.    Jose Ngo, NP 08/15/2023

## 2023-08-16 ENCOUNTER — Ambulatory Visit
Admission: RE | Admit: 2023-08-16 | Discharge: 2023-08-16 | Disposition: A | Discharge status: Home / Self Care | Payer: Medicaid Other | Source: Ambulatory Visit | Attending: Acute Care | Admitting: Acute Care

## 2023-08-16 DIAGNOSIS — Z122 Encounter for screening for malignant neoplasm of respiratory organs: Secondary | ICD-10-CM | POA: Insufficient documentation

## 2023-08-16 DIAGNOSIS — F17210 Nicotine dependence, cigarettes, uncomplicated: Secondary | ICD-10-CM | POA: Diagnosis not present

## 2023-08-16 DIAGNOSIS — Z87891 Personal history of nicotine dependence: Secondary | ICD-10-CM | POA: Insufficient documentation

## 2023-08-23 ENCOUNTER — Other Ambulatory Visit (HOSPITAL_COMMUNITY): Payer: Self-pay

## 2023-08-23 ENCOUNTER — Encounter: Payer: Medicaid Other | Admitting: Cardiology

## 2023-08-23 ENCOUNTER — Ambulatory Visit: Payer: Medicaid Other | Attending: Internal Medicine

## 2023-08-23 ENCOUNTER — Ambulatory Visit: Payer: Medicaid Other | Attending: Cardiology | Admitting: Cardiology

## 2023-08-23 VITALS — BP 116/80 | HR 74 | Wt 215.0 lb

## 2023-08-23 DIAGNOSIS — K5730 Diverticulosis of large intestine without perforation or abscess without bleeding: Secondary | ICD-10-CM | POA: Diagnosis not present

## 2023-08-23 DIAGNOSIS — Z79899 Other long term (current) drug therapy: Secondary | ICD-10-CM | POA: Diagnosis not present

## 2023-08-23 DIAGNOSIS — Z7901 Long term (current) use of anticoagulants: Secondary | ICD-10-CM

## 2023-08-23 DIAGNOSIS — J449 Chronic obstructive pulmonary disease, unspecified: Secondary | ICD-10-CM | POA: Insufficient documentation

## 2023-08-23 DIAGNOSIS — F1010 Alcohol abuse, uncomplicated: Secondary | ICD-10-CM | POA: Insufficient documentation

## 2023-08-23 DIAGNOSIS — I959 Hypotension, unspecified: Secondary | ICD-10-CM | POA: Insufficient documentation

## 2023-08-23 DIAGNOSIS — E785 Hyperlipidemia, unspecified: Secondary | ICD-10-CM | POA: Insufficient documentation

## 2023-08-23 DIAGNOSIS — I4819 Other persistent atrial fibrillation: Secondary | ICD-10-CM | POA: Insufficient documentation

## 2023-08-23 DIAGNOSIS — I110 Hypertensive heart disease with heart failure: Secondary | ICD-10-CM | POA: Insufficient documentation

## 2023-08-23 DIAGNOSIS — Z87891 Personal history of nicotine dependence: Secondary | ICD-10-CM | POA: Diagnosis not present

## 2023-08-23 DIAGNOSIS — I5032 Chronic diastolic (congestive) heart failure: Secondary | ICD-10-CM | POA: Insufficient documentation

## 2023-08-23 DIAGNOSIS — Z7984 Long term (current) use of oral hypoglycemic drugs: Secondary | ICD-10-CM | POA: Diagnosis not present

## 2023-08-23 DIAGNOSIS — D6959 Other secondary thrombocytopenia: Secondary | ICD-10-CM | POA: Insufficient documentation

## 2023-08-23 LAB — POCT INR: INR: 1.3 — AB (ref 2.0–3.0)

## 2023-08-23 MED ORDER — APIXABAN 5 MG PO TABS
5.0000 mg | ORAL_TABLET | Freq: Two times a day (BID) | ORAL | 6 refills | Status: DC
Start: 2023-08-23 — End: 2023-08-24

## 2023-08-23 NOTE — Patient Instructions (Addendum)
Medication Changes:  Start taking Eliquis 5 mg (1 tablet) 2 times a day.   Lab Work:  Labs done today, your results will be available in MyChart, we will contact you for abnormal readings.    Special Instructions // Education:  Do the following things EVERYDAY: Weigh yourself in the morning before breakfast. Write it down and keep it in a log. Take your medicines as prescribed Eat low salt foods--Limit salt (sodium) to 2000 mg per day.  Stay as active as you can everyday Limit all fluids for the day to less than 2 liters    You will receive a call from sleep studies clinic to schedule an appointment.   Follow-Up in: please call in December to schedule your January appointment.     If you have any questions or concerns before your next appointment please send [place] a message through [place] or call our office at [phone] Monday-Friday 8 am-5 pm.   If you have an urgent need after hours on the weekend please call your Primary Cardiologist or the [facility] in [place] at [phone].   At the [facility], you and your health needs are our priority. We have a designated team specialized in the treatment of Heart Failure. This Care Team includes your primary Heart Failure Specialized Cardiologist (physician), Advanced Practice Providers (APPs- Physician Assistants and Nurse Practitioners), and Pharmacist who all work together to provide you with the care you need, when you need it.   You may see any of the following providers on your designated Care Team at your next follow up:  Dr. Arvilla Meres Dr. Marca Ancona Dr. Dorthula Nettles Dr. Theresia Bough Tonye Becket, NP Robbie Lis, [place] [address] [place], [place] Brynda Peon, NP [place] Lee, NP Clarisa Kindred, NP Enos Fling, PharmD

## 2023-08-23 NOTE — Patient Instructions (Signed)
TAKE 2 TABLETS TODAY AND THEN START ELIQUIS 5 mg THURSDAY morning.

## 2023-08-23 NOTE — Progress Notes (Signed)
ADVANCED HE[facility] FAILURE CLINIC NOTE  Referring Physician: [name], NP  Primary Care: [name], NP HF Cardiology: Dr. Gasper Lloyd  HPI: Jose [name]. is a 52 y.o. male with hypertension, hyperlipidemia, COPD, significant alcohol abuse, history of seizures, persistent atrial fibrillation.  Patient was admitted in 1/24 with fevers chills cough and fatigue.  He presented hypotensive to 60s/40s with new onset atrial fibrillation with RVR. TTE during admit with LVEF of 50-55%, moderate biatrial enlargement, normal RV. He was seen by neurology for possible alcohol withdrawal related seizures and continued on home tegretol (undetectable levels on admit). For his atrial fibrillation he was discharged home on cardizem & lopressor. Anticoagulation was not started due to concern for continued alcohol abuse and thrombocytopenia.   Patient was ultimately started on warfarin and had TEE-guided DCCV in 3/24.  TEE showed EF 55%, normal RV, mild LAE.    PFTs in 5/24 showed severe COPD. Cardiolite in 5/24 showed no ischemia or infarction.   Patient was admitted in 6/24 with melena and AKI, creatinine up to 2.29 and hgb down to 7.9.  He was transfused.  EGD showed a single AVM, nonbleeding, treated with APC. Colonoscopy showed diverticulosis.  Due to low BP, diltiazem CD and Entresto were stopped.  Warfarin was held while in the hospital but was restarted at discharge.   He returns today for followup of CHF and atrial fibrillation.  He has cut back considerably on ETOH, corroborated by his mother.  Still smoking 2 cigars/day.  INR has been difficult to keep therapeutic.  He says that he has been taking his warfarin.  Generally does ok walking short distances.  He got short of breath walking around the grocery store earlier this week.  No chest pain.  No palpitations.  He is in NSR today.  No BRBPR/melena. Weight up 3 lbs.  No orthopnea/PND.    ECG (personally reviewed): NSR, 1st degree AVB  Labs  (1/24): K 3.7, creatinine 0.84, plts 345, hgb 12.3 Labs (2/24): K 4.6, creatinine 1.49, BNP 58 Labs (6/24): K 3.8, creatinine 2.29 => 0.82, hgb 11.4 => 7.9, plts 130 Labs (9/24): LDL 95, hgb 12.6, K 3.7, creatinine 0.72  Past Medical History:  Diagnosis Date   Alcohol abuse    Cervicalgia    CHF (congestive heart failure) (HCC)    COPD (chronic obstructive pulmonary disease) (HCC)    Hyperlipidemia    Hypertension    Seizures (HCC)     Current Outpatient Medications  Medication Sig Dispense Refill   albuterol (VENTOLIN HFA) 108 (90 Base) MCG/ACT inhaler Inhale 2 puffs into the lungs every 6 (six) hours as needed for wheezing or shortness of breath. 8 g 2   apixaban (ELIQUIS) 5 MG TABS tablet Take 1 tablet (5 mg total) by mouth 2 (two) times daily. 60 tablet 6   carbamazepine (TEGRETOL) 200 MG tablet Take 1 tablet (200 mg total) by mouth 2 (two) times daily. 180 tablet 1   empagliflozin (JARDIANCE) 10 MG TABS tablet Take 1 tablet (10 mg total) by mouth daily before breakfast. 90 tablet 1   ferrous sulfate (SV IRON) 325 (65 FE) MG tablet Take 1 tablet by mouth once daily 90 tablet 3   Fluticasone-Umeclidin-Vilant (TRELEGY ELLIPTA) 100-62.5-25 MCG/ACT AEPB Inhale 1 puff into the lungs daily. 60 each 5   folic acid (FOLVITE) 1 MG tablet Take 1 tablet (1 mg total) by mouth daily. 90 tablet 0   furosemide (LASIX) 40 MG tablet Take 1 tablet (40 mg  total) by mouth every morning. 30 tablet 3   losartan (COZAAR) 25 MG tablet Take 0.5 tablets (12.5 mg total) by mouth at bedtime. 45 tablet 3   Magnesium Oxide -Mg Supplement (MAG-OXIDE) 200 MG TABS Take 1 tablet (200 mg total) by mouth 2 (two) times daily. 180 tablet 1   metoprolol tartrate (LOPRESSOR) 25 MG tablet Take 1 tablet (25 mg total) by mouth 2 (two) times daily. 60 tablet 3   potassium chloride (KLOR-CON) 10 MEQ tablet Take 20 mEq by mouth daily.     rosuvastatin (CRESTOR) 20 MG tablet Take 1 tablet (20 mg total) by mouth daily. 90 tablet  3   spironolactone (ALDACTONE) 25 MG tablet Take 0.5 tablets (12.5 mg total) by mouth daily. 15 tablet 6   No current facility-administered medications for this visit.    No Known Allergies    Social History   Socioeconomic History   Marital status: Widowed    Spouse name: Not on file   Number of children: Not on file   Years of education: Not on file   Highest education level: Not on file  Occupational History   Not on file  Tobacco Use   Smoking status: Every Day    Average packs/day: 1.5 packs/day for 30.0 years (45.0 ttl pk-yrs)    Types: Cigarettes, Cigars    Start date: 78    Last attempt to quit: 2021    Years since quitting: 3.7   Smokeless tobacco: Former   Tobacco comments:    2 cigars a day- 06/30/2023 khj    Quit smoking cigarettes 3 years ago.  Smokes cigarettes since he was 52 years old.  Vaping Use   Vaping status: Never Used  Substance and Sexual Activity   Alcohol use: Yes    Alcohol/week: 28.0 standard drinks of alcohol    Types: 7 Cans of beer, 21 Shots of liquor per week    Comment: pint of liquor per day   Drug use: No    Types: Marijuana    Comment: quit back in the late 90's   Sexual activity: Yes  Other Topics Concern   Not on file  Social History Narrative   Lives with his Mother Jose [name]    Social Determinants of Health   Financial Resource Strain: Low Risk  (07/01/2019)   Overall Financial Resource Strain (CARDIA)    Difficulty of Paying Living Expenses: Not hard at all  Food Insecurity: No Food Insecurity (04/26/2023)   Hunger Vital Sign    Worried About Running Out of Food in the Last Year: Never true    Ran Out of Food in the Last Year: Never true  Transportation Needs: No Transportation Needs (04/26/2023)   PRAPARE - Administrator, Civil Service (Medical): No    Lack of Transportation (Non-Medical): No  Physical Activity: Sufficiently Active (07/01/2019)   Exercise Vital Sign    Days of Exercise per Week: 3 days     Minutes of Exercise per Session: 50 min  Stress: No Stress Concern Present (09/02/2019)   [employer] of Occupational Health - Occupational Stress Questionnaire    Feeling of Stress : Not at all  Social Connections: Moderately Isolated (07/01/2019)   Social Connection and Isolation Panel [NHANES]    Frequency of Communication with Friends and Family: Three times a week    Frequency of Social Gatherings with Friends and Family: Three times a week    Attends Religious Services: 1 to 4 times per year  Active Member of Clubs or Organizations: No    Attends Banker Meetings: Never    Marital Status: Widowed  Intimate Partner Violence: Not At Risk (04/26/2023)   Humiliation, Afraid, Rape, and Kick questionnaire    Fear of Current or Ex-Partner: No    Emotionally Abused: No    Physically Abused: No    Sexually Abused: No      Family History  Problem Relation Age of Onset   Hypertension Mother    Diabetes Mother    Colon cancer Mother    Hypertension Father    Multiple sclerosis Father     PHYSICAL EXAM: Vitals:   08/23/23 1408  BP: 116/80  Pulse: 74  SpO2: 97%   General: NAD Neck: No JVD, no thyromegaly or thyroid nodule.  Lungs: Clear to auscultation bilaterally with normal respiratory effort. CV: Nondisplaced PMI.  Heart regular S1/S2, no S3/S4, no murmur.  No peripheral edema.  No carotid bruit.  Normal pedal pulses.  Abdomen: Soft, nontender, no hepatosplenomegaly, no distention.  Skin: Intact without lesions or rashes.  Neurologic: Alert and oriented x 3.  Psych: Normal affect. Extremities: No clubbing or cyanosis.  HEENT: Normal.   ECHO: 11/11/22: LVEF 50-55%, normal RV function 11/05/21: LVEF 60-65%, normal RV function TEE (3/24): EF 55%, RV normal  Cardiolite (5/24): EF 45% but visually looks normal, no ischemia/infarction   ASSESSMENT & PLAN:  1.  Chronic diastolic CHF: Echo in 1/24 with EF 50-55%, normal RV in setting of atrial  fibrillation with RVR.  TEE in 3/24 with EF 55% and normal RV.  Cardiolite in 5/24 with no ischemia/infarction.  NYHA class II-III symptoms.  He remains in NSR and is not volume overloaded on exam. I suspect dyspnea is multifactorial with severe COPD playing a role.   - Continue Lasix 40 mg daily, would not increase.  BMET/BNP today.  - Continue Jardiance 10 mg daily.  - Continue spironolactone 12.5 daily.  2. Atrial fibrillation: Paroxysmal.  He was not started on anticoagulation during 1/24 admission due to ETOH abuse, thrombocytopenia, and concerns about fall risk and ability to take anticoagulation consistently. However, anticoagulation was later started and he was cardioverted in 3/24.  He has not had any falls.  He has had GI bleeding (episode in 6/24).  No overt bleeding recently.  Unfortunately, he is on Tegretol so cannot take Eliquis, Xarelto, or Pradaxa.  GFR is too high for edoxaban.  He is, therefore, on warfarin.  He is in NSR today.  He has cut back on ETOH.  - Continue warfarin with INR goal 1.8-2.3.   - Continue metoprolol 25 mg BID - Still needs sleep study.  - If he is able to stay off ETOH, will refer to EP for AF ablation. Watchman is also a consideration.     3. Alcohol abuse: Long history of alcohol abuse. He has cut back a lot recently.  - Discussed imperative to continue cutting back ETOH.  4. Thrombocytopenia: ?Related to ETOH abuse.  Recent platelets have been higher.  5. GI bleeding: Episode of suspected upper GI bleeding in 6/24 with melena.  EGD showed AVM that was non-bleeding, he had APC.  Colonoscopy showed diverticulosis.  Bleeding attributed to ETOH abuse.  Warfarin was stopped in hospital but he restarted after discharge.  He denies melena/BRBPR currently.  - Continue warfarin for now but decrease INR goal for the time being to 1.8-2.3.  6. COPD: Severe obstruction on PFTs in 5/24, still smoking cigars. Suspect this  contributes to his dyspnea.  - I urged him to  quit.  - Follows with pulmonology. .   Followup in 3 months.   Asaf Elmquist 08/23/2023

## 2023-08-24 ENCOUNTER — Telehealth: Payer: Self-pay | Admitting: Pharmacist

## 2023-08-24 LAB — BASIC METABOLIC PANEL
BUN/Creatinine Ratio: 15 (ref 9–20)
BUN: 12 mg/dL (ref 6–24)
CO2: 24 mmol/L (ref 20–29)
Calcium: 10.1 mg/dL (ref 8.7–10.2)
Chloride: 99 mmol/L (ref 96–106)
Creatinine, Ser: 0.78 mg/dL (ref 0.76–1.27)
Glucose: 86 mg/dL (ref 70–99)
Potassium: 4.1 mmol/L (ref 3.5–5.2)
Sodium: 140 mmol/L (ref 134–144)
eGFR: 107 mL/min/{1.73_m2} (ref >59)

## 2023-08-24 LAB — CBC
Hematocrit: 40.4 % (ref 37.5–51.0)
Hemoglobin: 13.4 g/dL (ref 13.0–17.7)
MCH: 30.4 pg (ref 26.6–33.0)
MCHC: 33.2 g/dL (ref 31.5–35.7)
MCV: 92 fL (ref 79–97)
Platelets: 198 10*3/uL (ref 150–450)
RBC: 4.41 x10E6/uL (ref 4.14–5.80)
RDW: 12.7 % (ref 11.6–15.4)
WBC: 6.3 10*3/uL (ref 3.4–10.8)

## 2023-08-24 LAB — BRAIN NATRIURETIC PEPTIDE: BNP: 16.4 pg/mL (ref 0.0–100.0)

## 2023-08-24 MED ORDER — WARFARIN SODIUM 5 MG PO TABS: ORAL_TABLET | ORAL | 1 refills | Status: DC

## 2023-08-24 NOTE — Telephone Encounter (Signed)
At CHF appointment on 08/23/23, transition from warfarin to Eliquis was initially planned, however the decision was made to continue warfarin given drug interaction of Eliquis with carbamazepine. Patient was called and informed not to pick up Eliquis and resume home warfarin regimen with an extra 5 mg today. Patient voiced understanding. Will coordinate appointment with Casimiro Needle Dapp in 1-2 weeks.

## 2023-08-28 ENCOUNTER — Other Ambulatory Visit (HOSPITAL_COMMUNITY): Payer: Self-pay

## 2023-08-28 ENCOUNTER — Telehealth: Payer: Self-pay

## 2023-08-28 NOTE — Telephone Encounter (Signed)
*  Pulm  Prior Authorization form/request asks a question that requires your assistance. Please see the question below and advise accordingly.   Which TWO preferred inhaled corticosteroid combination medications has the member had an inadequate response to after a therapeutic trial with each?  Advair Diskus, Advair HFA, Dulera, Symbicort are preferred.

## 2023-08-29 NOTE — Telephone Encounter (Signed)
According to patient's chart, he has tried [place], [employer] and incruse.   Katie, please advise if one of the preferred inhalers can be prescribed?

## 2023-08-31 ENCOUNTER — Other Ambulatory Visit: Payer: Self-pay | Admitting: Acute Care

## 2023-08-31 DIAGNOSIS — Z87891 Personal history of nicotine dependence: Secondary | ICD-10-CM

## 2023-08-31 DIAGNOSIS — Z122 Encounter for screening for malignant neoplasm of respiratory organs: Secondary | ICD-10-CM

## 2023-09-04 ENCOUNTER — Encounter: Payer: Medicaid Other | Admitting: Cardiology

## 2023-09-06 ENCOUNTER — Ambulatory Visit: Payer: Medicaid Other | Attending: Internal Medicine

## 2023-09-06 DIAGNOSIS — Z5181 Encounter for therapeutic drug level monitoring: Secondary | ICD-10-CM | POA: Diagnosis not present

## 2023-09-06 DIAGNOSIS — I4819 Other persistent atrial fibrillation: Secondary | ICD-10-CM

## 2023-09-06 LAB — POCT INR: INR: 1.3 — AB (ref 2.0–3.0)

## 2023-09-06 MED ORDER — SPIRIVA RESPIMAT 2.5 MCG/ACT IN AERS: 2.0000 | INHALATION_SPRAY | Freq: Every day | RESPIRATORY_TRACT | 11 refills | Status: DC

## 2023-09-06 MED ORDER — FLUTICASONE-SALMETEROL 100-50 MCG/ACT IN AEPB: 1.0000 | INHALATION_SPRAY | Freq: Two times a day (BID) | RESPIRATORY_TRACT | 11 refills | Status: AC

## 2023-09-06 NOTE — Telephone Encounter (Signed)
Lm x1 for patient.  

## 2023-09-06 NOTE — Telephone Encounter (Signed)
I have notified the patient and send in the prescriptions.  Nothing further needed.

## 2023-09-06 NOTE — Telephone Encounter (Signed)
Those are only ICS/LABA inhalers; Trelegy is triple therapy so it's not really comparable. If insurance won't cover Trelegy or Markus Daft, then we'll have to do two separate inhalers to accomplish the same goal. He can do Advair diskus 100 mcg 1 puff Twice daily. Brush tongue and rinse mouth afterwards. Spiriva 2 puffs daily  Please let patient know of the changes. Side effect profile is the same as Trelegy. Thanks.

## 2023-09-06 NOTE — Telephone Encounter (Signed)
Patient is returning phone call. Patient phone number is [phone].

## 2023-09-06 NOTE — Patient Instructions (Signed)
TAKE 2 TABLETS TODAY, THEN INCREASE TO 2 TABLETS DAILY, EXCEPT 1 TABLET ON SUNDAY, WEDNESDAY AND SATURDAY.  INR in 2 weeks

## 2023-09-08 DIAGNOSIS — Z419 Encounter for procedure for purposes other than remedying health state, unspecified: Secondary | ICD-10-CM | POA: Diagnosis not present

## 2023-09-18 ENCOUNTER — Telehealth: Payer: Self-pay | Admitting: Cardiology

## 2023-09-18 ENCOUNTER — Other Ambulatory Visit: Payer: Self-pay

## 2023-09-18 DIAGNOSIS — I4819 Other persistent atrial fibrillation: Secondary | ICD-10-CM

## 2023-09-18 MED ORDER — FUROSEMIDE 40 MG PO TABS: 40.0000 mg | ORAL_TABLET | Freq: Every morning | ORAL | 5 refills | Status: DC

## 2023-09-20 ENCOUNTER — Ambulatory Visit: Payer: Medicaid Other | Attending: Internal Medicine

## 2023-09-20 DIAGNOSIS — I4819 Other persistent atrial fibrillation: Secondary | ICD-10-CM

## 2023-09-20 DIAGNOSIS — Z5181 Encounter for therapeutic drug level monitoring: Secondary | ICD-10-CM | POA: Diagnosis not present

## 2023-09-20 LAB — POCT INR: INR: 2.2 (ref 2.0–3.0)

## 2023-09-20 NOTE — Patient Instructions (Signed)
Continue 2 TABLETS DAILY, EXCEPT 1 TABLET ON SUNDAY, WEDNESDAY AND SATURDAY.  INR in 4 weeks

## 2023-09-25 ENCOUNTER — Telehealth: Payer: Self-pay | Admitting: Nurse Practitioner

## 2023-09-25 NOTE — Telephone Encounter (Addendum)
Pharmacy called and pt now has Medicaid.  Medicaid is asking for a prior authorization for his mpagliflozin (JARDIANCE) 10 MG TABS tablet .Pharmacist states they have sent several times

## 2023-09-26 NOTE — Telephone Encounter (Signed)
PA for Jardiance initiated and submitted via Cover My Meds. Key: [id number]

## 2023-09-27 NOTE — Telephone Encounter (Signed)
PA approved. Called and notified the pharmacy of the approval.

## 2023-10-03 DIAGNOSIS — R202 Paresthesia of skin: Secondary | ICD-10-CM | POA: Diagnosis not present

## 2023-10-03 DIAGNOSIS — G40909 Epilepsy, unspecified, not intractable, without status epilepticus: Secondary | ICD-10-CM | POA: Diagnosis not present

## 2023-10-03 DIAGNOSIS — M6281 Muscle weakness (generalized): Secondary | ICD-10-CM | POA: Diagnosis not present

## 2023-10-03 DIAGNOSIS — Z79899 Other long term (current) drug therapy: Secondary | ICD-10-CM | POA: Diagnosis not present

## 2023-10-03 DIAGNOSIS — R42 Dizziness and giddiness: Secondary | ICD-10-CM | POA: Diagnosis not present

## 2023-10-04 ENCOUNTER — Other Ambulatory Visit: Payer: Self-pay | Admitting: Student

## 2023-10-04 DIAGNOSIS — R202 Paresthesia of skin: Secondary | ICD-10-CM

## 2023-10-04 DIAGNOSIS — R42 Dizziness and giddiness: Secondary | ICD-10-CM

## 2023-10-08 DIAGNOSIS — Z419 Encounter for procedure for purposes other than remedying health state, unspecified: Secondary | ICD-10-CM | POA: Diagnosis not present

## 2023-10-09 ENCOUNTER — Other Ambulatory Visit: Payer: Self-pay | Admitting: Student

## 2023-10-09 DIAGNOSIS — R202 Paresthesia of skin: Secondary | ICD-10-CM

## 2023-10-11 ENCOUNTER — Ambulatory Visit
Admission: RE | Admit: 2023-10-11 | Discharge: 2023-10-11 | Disposition: A | Discharge status: Home / Self Care | Payer: Medicaid Other | Source: Ambulatory Visit | Attending: Student | Admitting: Student

## 2023-10-11 DIAGNOSIS — R202 Paresthesia of skin: Secondary | ICD-10-CM | POA: Diagnosis not present

## 2023-10-11 DIAGNOSIS — R42 Dizziness and giddiness: Secondary | ICD-10-CM | POA: Insufficient documentation

## 2023-10-11 DIAGNOSIS — I6523 Occlusion and stenosis of bilateral carotid arteries: Secondary | ICD-10-CM | POA: Diagnosis not present

## 2023-10-13 ENCOUNTER — Telehealth: Payer: Self-pay | Admitting: Nurse Practitioner

## 2023-10-13 DIAGNOSIS — E119 Type 2 diabetes mellitus without complications: Secondary | ICD-10-CM | POA: Diagnosis not present

## 2023-10-13 DIAGNOSIS — J441 Chronic obstructive pulmonary disease with (acute) exacerbation: Secondary | ICD-10-CM | POA: Diagnosis not present

## 2023-10-13 DIAGNOSIS — R202 Paresthesia of skin: Secondary | ICD-10-CM | POA: Diagnosis not present

## 2023-10-13 DIAGNOSIS — Z604 Social exclusion and rejection: Secondary | ICD-10-CM | POA: Diagnosis not present

## 2023-10-13 DIAGNOSIS — Z87718 Personal history of other specified (corrected) congenital malformations of genitourinary system: Secondary | ICD-10-CM | POA: Diagnosis not present

## 2023-10-13 DIAGNOSIS — Z7984 Long term (current) use of oral hypoglycemic drugs: Secondary | ICD-10-CM | POA: Diagnosis not present

## 2023-10-13 DIAGNOSIS — F17200 Nicotine dependence, unspecified, uncomplicated: Secondary | ICD-10-CM | POA: Diagnosis not present

## 2023-10-13 DIAGNOSIS — I051 Rheumatic mitral insufficiency: Secondary | ICD-10-CM | POA: Diagnosis not present

## 2023-10-13 DIAGNOSIS — G40909 Epilepsy, unspecified, not intractable, without status epilepticus: Secondary | ICD-10-CM | POA: Diagnosis not present

## 2023-10-13 DIAGNOSIS — E785 Hyperlipidemia, unspecified: Secondary | ICD-10-CM | POA: Diagnosis not present

## 2023-10-13 DIAGNOSIS — Z7951 Long term (current) use of inhaled steroids: Secondary | ICD-10-CM | POA: Diagnosis not present

## 2023-10-13 DIAGNOSIS — Z7902 Long term (current) use of antithrombotics/antiplatelets: Secondary | ICD-10-CM | POA: Diagnosis not present

## 2023-10-13 IMAGING — CR DG SHOULDER 2+V*L*
3 series · 3 of 3 positions shown · non-contrast
Comparison: One-view chest x-ray 05/16/2020

CLINICAL DATA: Seizure.  Fall.  Left shoulder pain.

EXAM:
LEFT SHOULDER - 2+ VIEW

[shoulder y view]
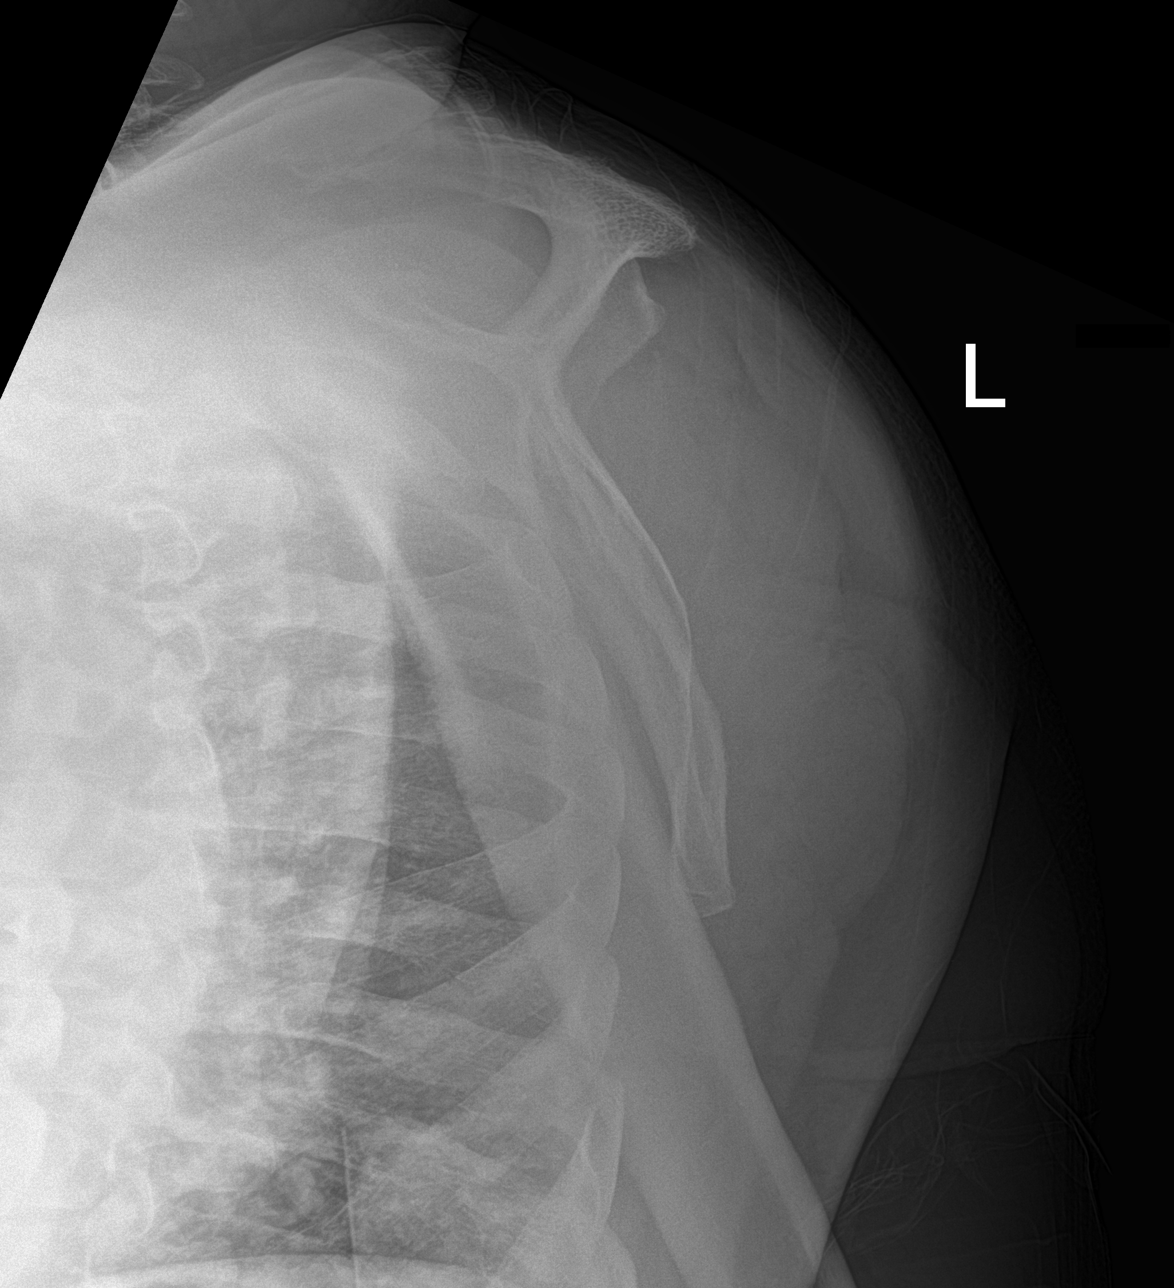

[shoulder ap neutral]
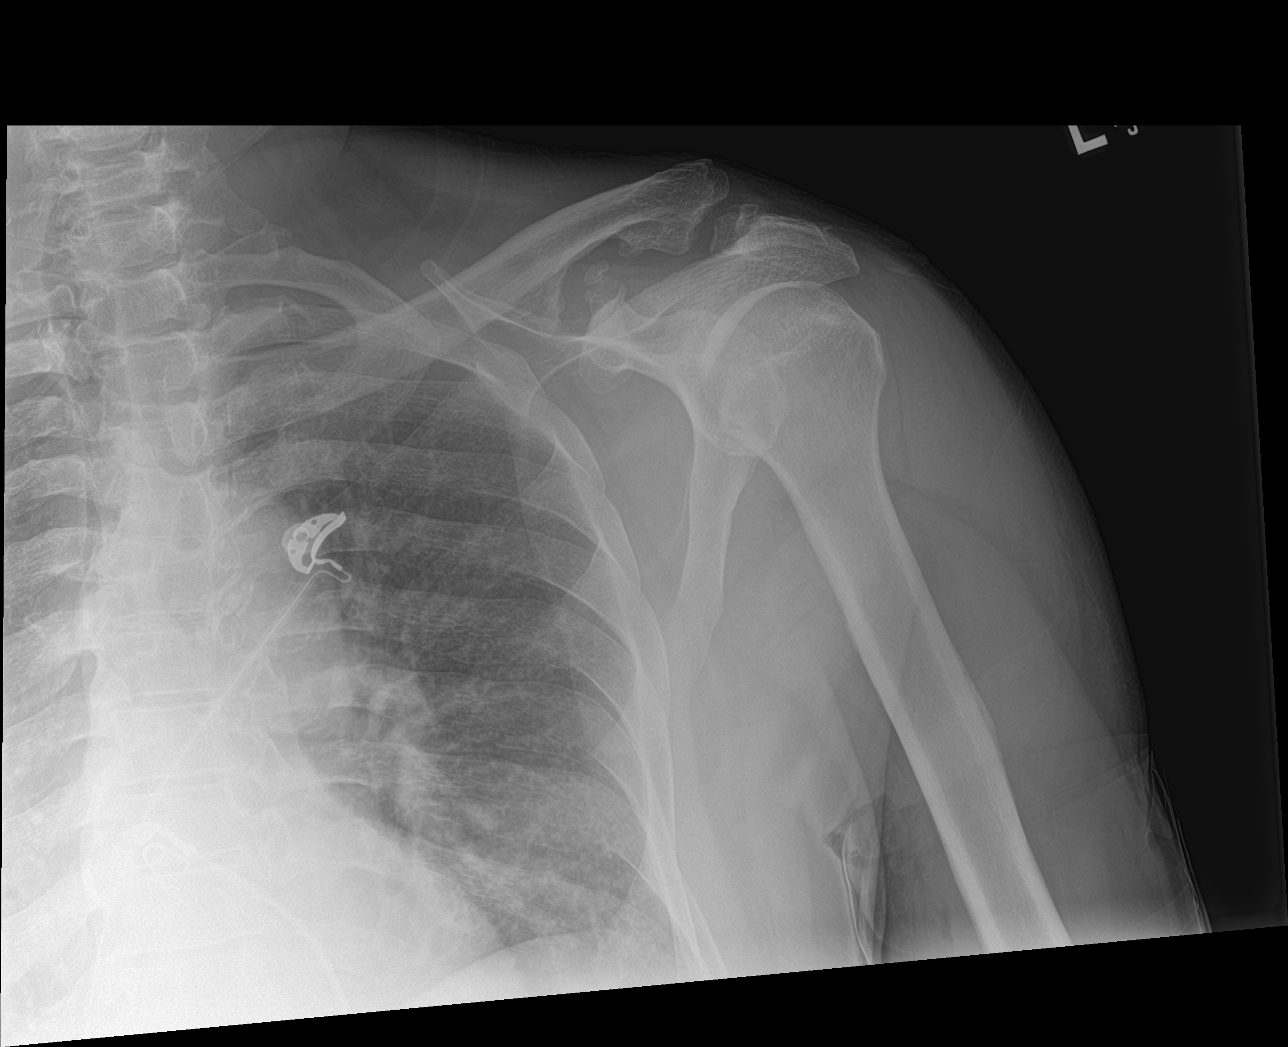

[shoulder grashey]
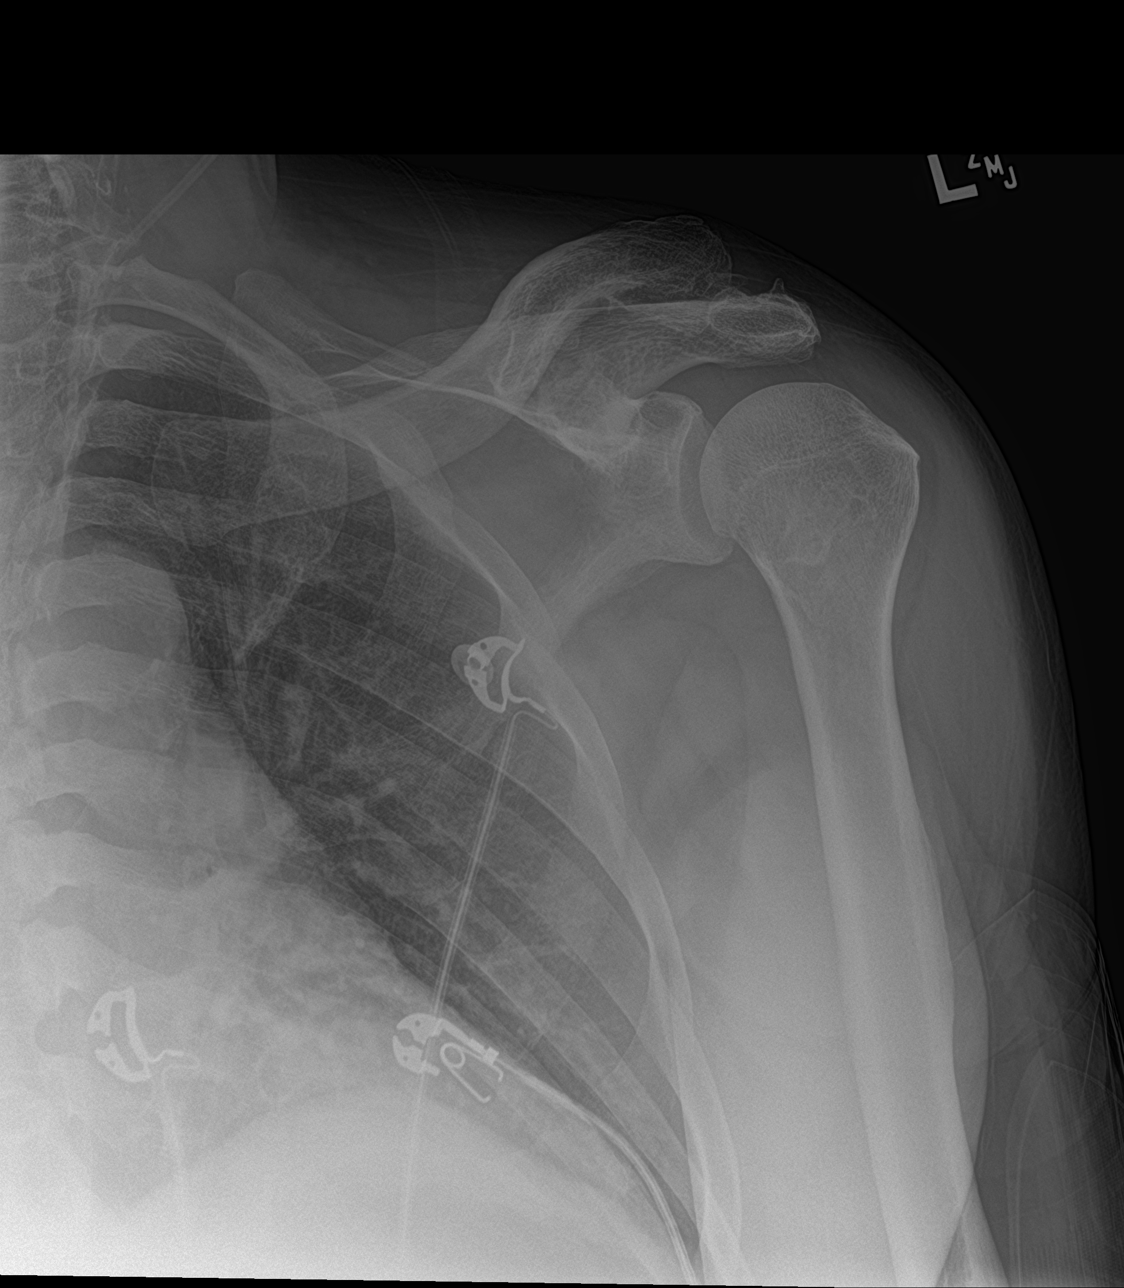

[3 of 3 positions shown; findings below may reference images not displayed]

FINDINGS: The left shoulder is located. Advanced degenerative changes are
noted at the AC joint and coracoclavicular ligament suggesting
previous trauma. No acute trauma is present. Visualized hemithorax
is clear.
IMPRESSION: 1. No acute abnormality.
2. Advanced degenerative changes of the left AC joint and
coracoclavicular ligament suggesting previous trauma.

## 2023-10-13 IMAGING — CT CT HEAD W/O CM
4 series · 16 of 47 positions shown, 18 images · non-contrast
Comparison: None.

CLINICAL DATA: Seizures

EXAM:
CT HEAD WITHOUT CONTRAST
TECHNIQUE: Contiguous axial images were obtained from the base of the skull
through the vertex without intravenous contrast.

[Series 2: head wo · axial · 0.42mm/px · z∈[-120,+0]mm · 7 of 33 slices shown, 9 images]
[im 5/33  brain]
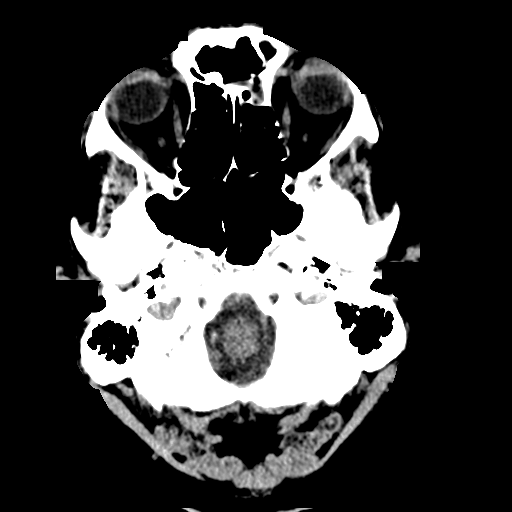
[im 5/33  bone]
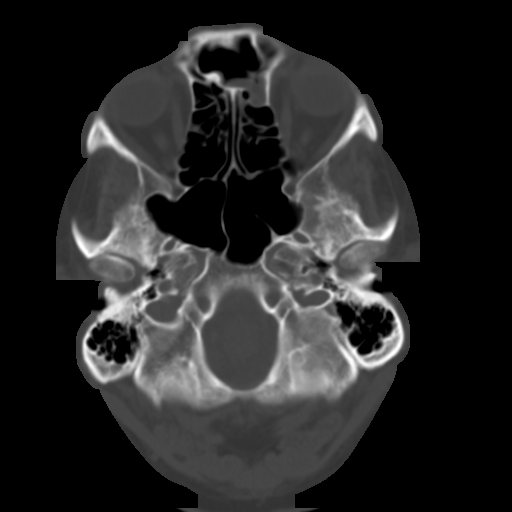
[im 9/33  brain]
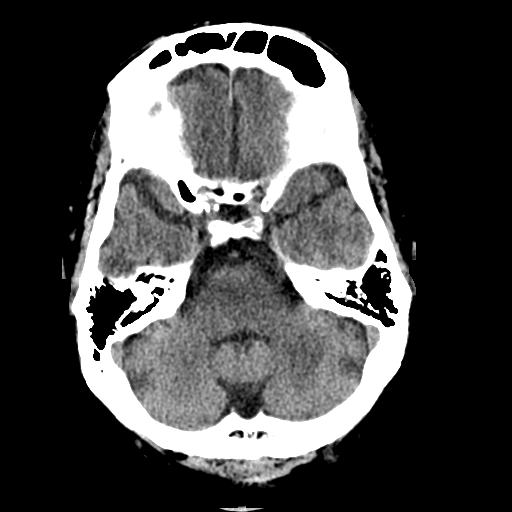
[im 13/33  brain]
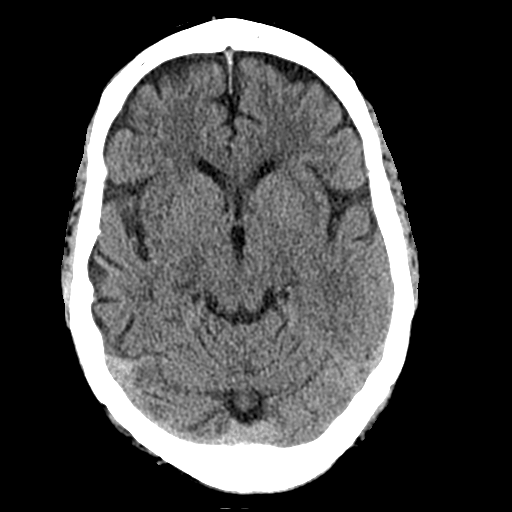
[im 17/33  brain]
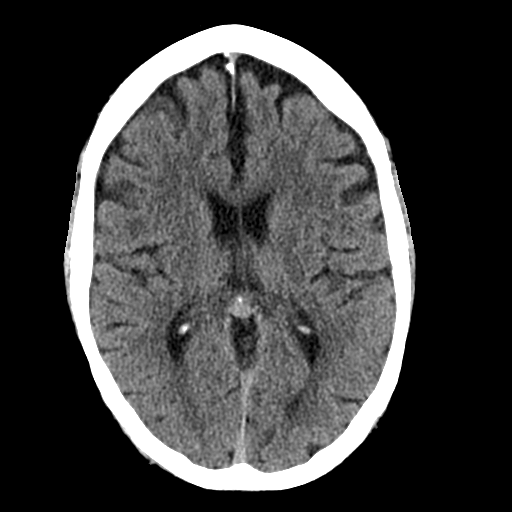
[im 21/33  brain]
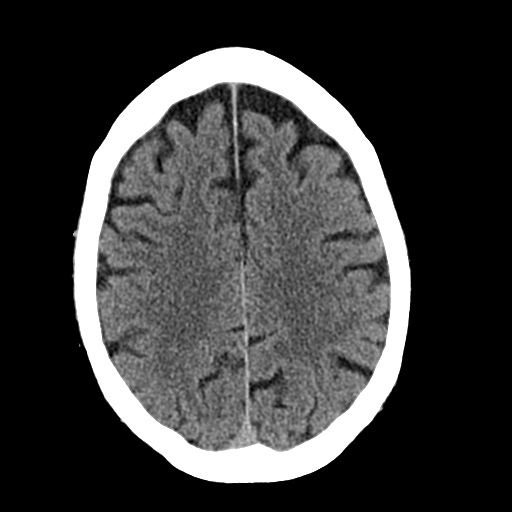
[im 21/33  bone]
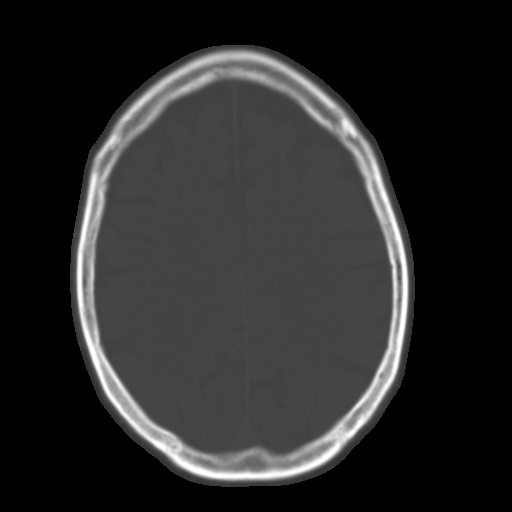
[im 25/33  brain]
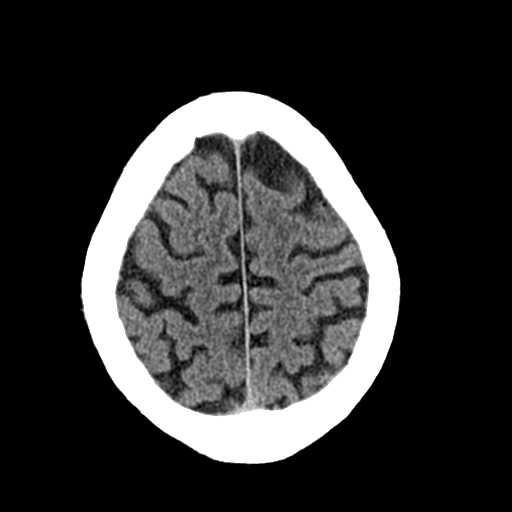
[im 29/33  brain]
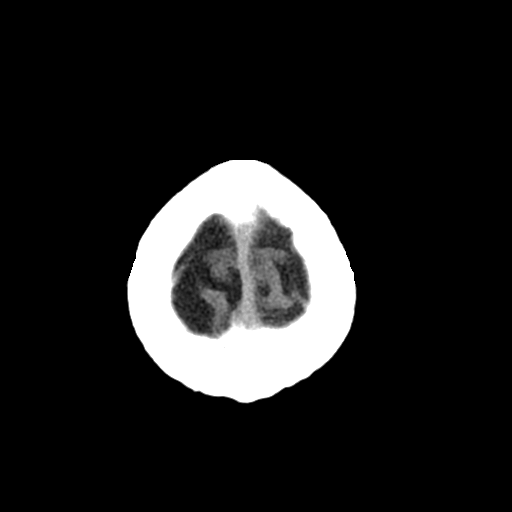

[Series 3: head bone · axial · 0.42mm/px · z∈[-124,-92]mm · 3 of 81 slices shown]
[im 9/81  bone]
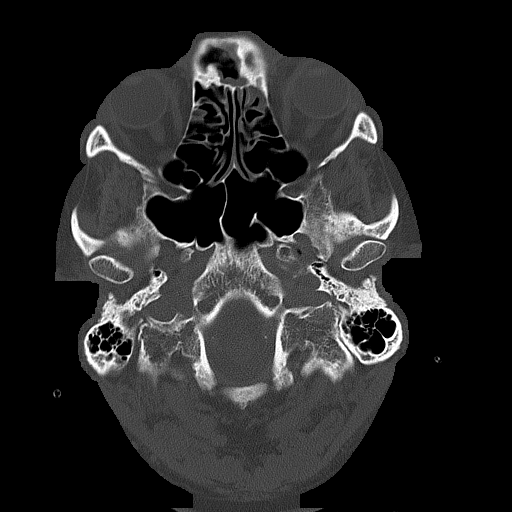
[im 17/81  bone]
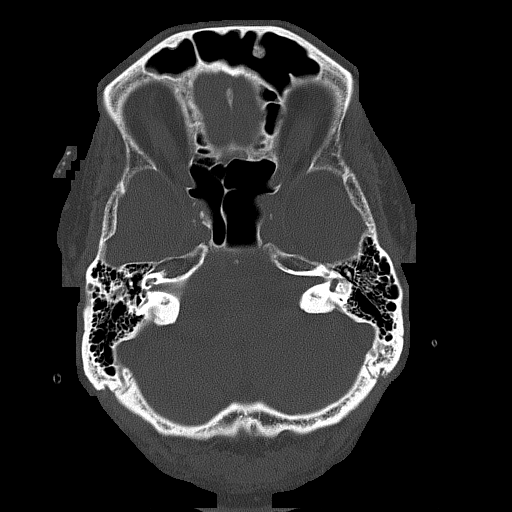
[im 25/81  bone]
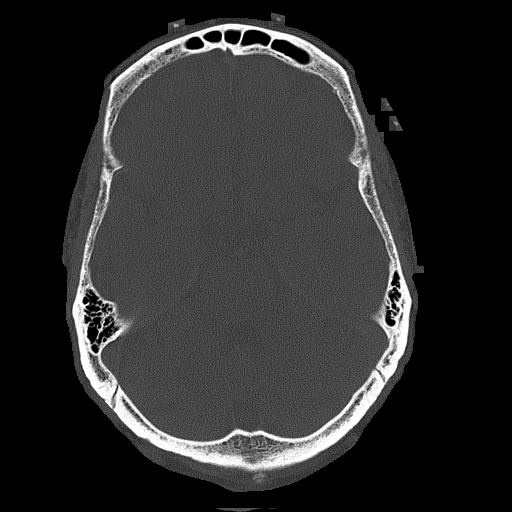

[Series 4: coronal soft tissue · coronal · 0.31mm/px · 3 of 70 slices shown]
[im 24/70  brain]
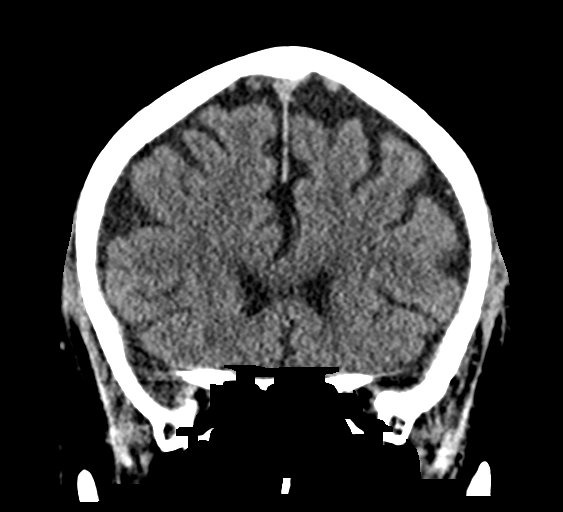
[im 31/70  brain]
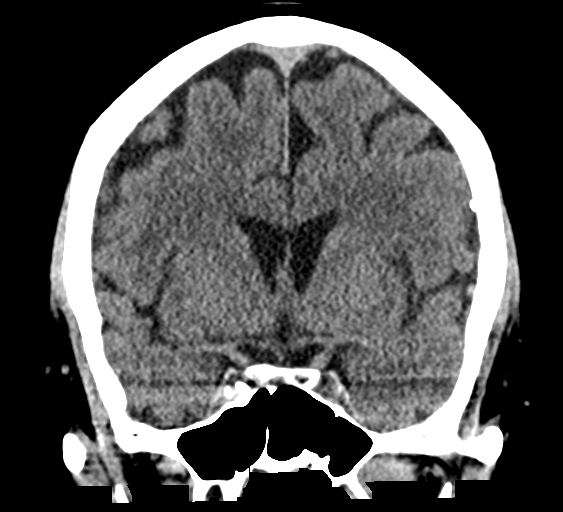
[im 39/70  brain]
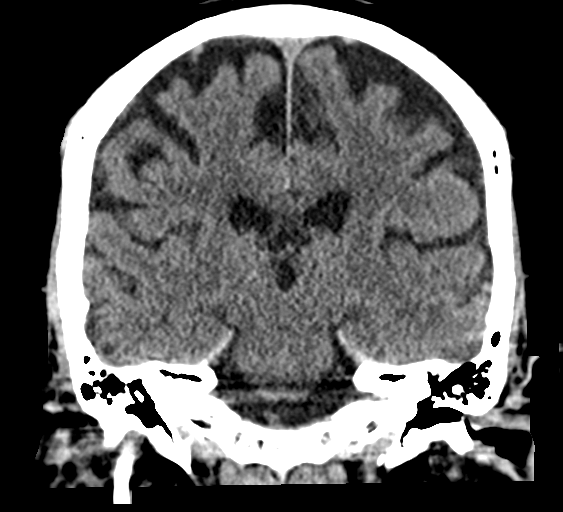

[Series 5: sagittal soft tissue · sagittal · 0.29mm/px · 3 of 57 slices shown]
[im 19/57  brain]
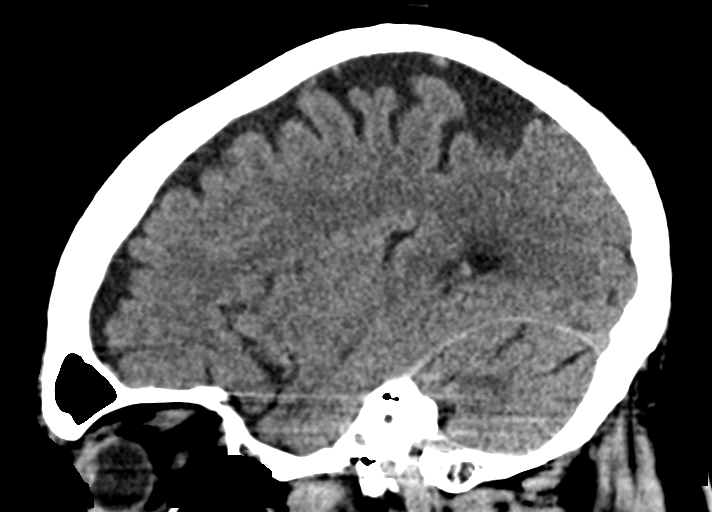
[im 29/57  brain]
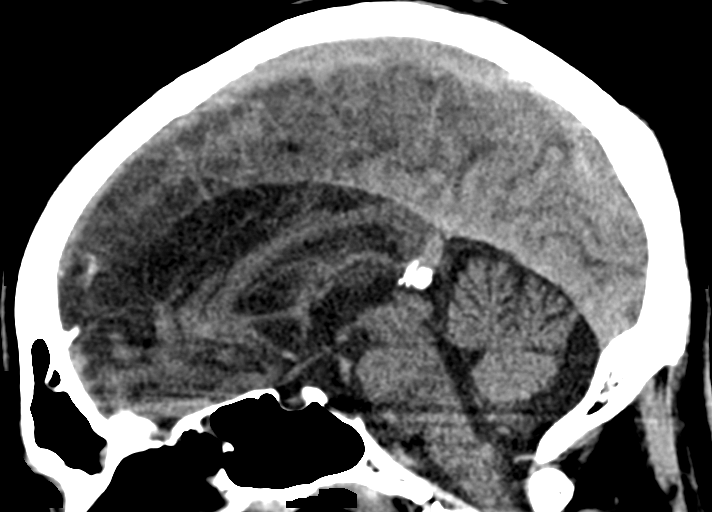
[im 38/57  brain]
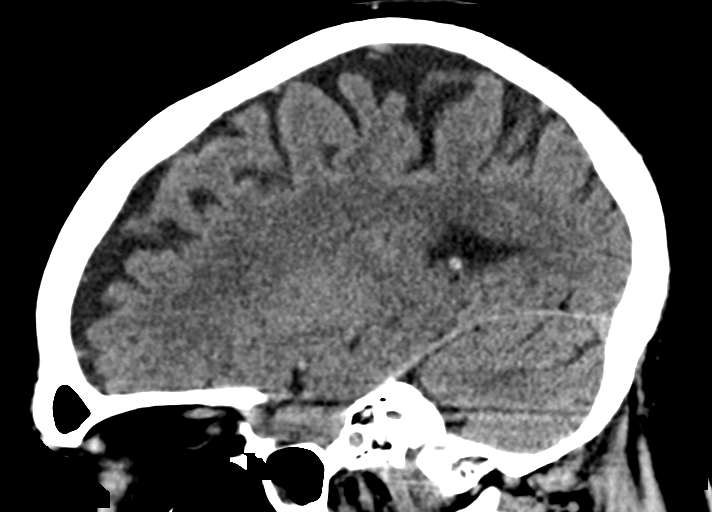

[16 of 47 positions shown; findings below may reference images not displayed]

FINDINGS: Brain: There is no acute intracranial hemorrhage, mass effect, or
edema. No acute appearing loss of gray-white differentiation.
Lateral right temporal lobe encephalomalacia. There is no
extra-axial fluid collection. Prominence of ventricles and sulci
reflects mild parenchymal volume loss, greater than expected for
age.

Vascular: There is atherosclerotic calcification at the skull base.

Skull: Calvarium is unremarkable.

Sinuses/Orbits: Patchy paranasal sinus mucosal thickening. Orbits
are unremarkable.

Other: None.
IMPRESSION: No acute intracranial abnormality.

Lateral right temporal lobe encephalomalacia.

Mild parenchymal volume loss greater than expected for age.

## 2023-10-13 NOTE — Telephone Encounter (Signed)
Attempted to reach patient as well as [name] from [facility], LVMs to call office back as the provider stated that the patient would need an appointment.  Put in CRM.

## 2023-10-13 NOTE — Telephone Encounter (Signed)
Patient needs an appt.

## 2023-10-13 NOTE — Telephone Encounter (Signed)
Jose [name] from [facility] called to report  COPD Exacerbation  1w5 1Month1 2PRN  Jose [name] from [facility] believes he needs a chest Xray especially in his right lung. Pt believes he already had one  Cardizem and also entresto - she cannot find in the home Metoprolol Tartrate the dosage is different Also has questions about how much oxygen he should have as needed  Best contact: [phone]

## 2023-10-16 ENCOUNTER — Encounter: Payer: Self-pay | Admitting: Pulmonary Disease

## 2023-10-16 ENCOUNTER — Ambulatory Visit: Payer: Medicaid Other | Admitting: Pulmonary Disease

## 2023-10-16 VITALS — BP 134/86 | HR 74 | Temp 97.1°F | Ht 70.0 in | Wt 221.8 lb

## 2023-10-16 DIAGNOSIS — G4734 Idiopathic sleep related nonobstructive alveolar hypoventilation: Secondary | ICD-10-CM | POA: Diagnosis not present

## 2023-10-16 DIAGNOSIS — F1010 Alcohol abuse, uncomplicated: Secondary | ICD-10-CM

## 2023-10-16 DIAGNOSIS — J449 Chronic obstructive pulmonary disease, unspecified: Secondary | ICD-10-CM | POA: Diagnosis not present

## 2023-10-16 DIAGNOSIS — J209 Acute bronchitis, unspecified: Secondary | ICD-10-CM

## 2023-10-16 DIAGNOSIS — F17200 Nicotine dependence, unspecified, uncomplicated: Secondary | ICD-10-CM

## 2023-10-16 MED ORDER — METHYLPREDNISOLONE 4 MG PO TBPK: ORAL_TABLET | ORAL | 0 refills | Status: DC

## 2023-10-16 MED ORDER — AMOXICILLIN-POT CLAVULANATE 875-125 MG PO TABS: 1.0000 | ORAL_TABLET | Freq: Two times a day (BID) | ORAL | 0 refills | Status: AC

## 2023-10-16 NOTE — Patient Instructions (Signed)
VISIT SUMMARY:  During today's visit, we discussed your symptoms of morning congestion, nasally voice, and occasional coughing with light brown sputum. We also reviewed your COPD management and recent lung cancer screening results. You are currently taking Advair and Spiriva for COPD and using a rescue inhaler as needed. We addressed your smoking habit and its impact on your respiratory health.  YOUR PLAN:  -BRONCHITIS: Bronchitis is an inflammation of the bronchial tubes, often causing coughing and mucus production. Your symptoms are likely exacerbated by smoking. We have prescribed a prednisone taper and an antibiotic that will not interfere with your warfarin. Please take these medications as directed. Smoking cessation is strongly advised to help resolve your symptoms. We will follow up in 4-6 weeks to ensure your symptoms have improved.  -CHRONIC OBSTRUCTIVE PULMONARY DISEASE (COPD): COPD is a chronic lung disease that makes it hard to breathe. You are currently managing it with Advair and Spiriva, and using a rescue inhaler as needed. It is important to continue these medications and monitor your symptoms. Smoking cessation is strongly advised to improve your respiratory function and overall health.  -GENERAL HEALTH MAINTENANCE: Quitting smoking is crucial for improving your overall health and respiratory function. Please consider smoking cessation programs or resources to help you quit. Regular monitoring and follow-up are important to manage your health effectively.  INSTRUCTIONS:  Please follow up in 4-6 weeks to ensure your symptoms have improved and to adjust your treatment plan if necessary.

## 2023-10-16 NOTE — Progress Notes (Signed)
Subjective:    Patient ID: Jose [name]., male    DOB: Aug 13, 1971, 52 y.o.   MRN: [id number]  Patient Care Team: [name], NP as PCP - General Gustavus Bryant, LCSW as Social Worker (Licensed Clinical Social Worker) Salena Saner, MD as Consulting Physician (Pulmonary Disease) Noemi Chapel, NP as Nurse Practitioner (Nurse Practitioner)  Chief Complaint  Patient presents with   Follow-up    DOE. Little wheezing. Cough with clear sputum. Cold for about a week. No fevers, chills or sweats.    BACKGROUND/INTERVAL:Patient is a 52 year old current cigar smoker with 33-pack-year history of cigarette smoking who presents for follow-up on the issue of shortness of breath.  He has stage III COPD.  Currently maintained on Advair discus and Spiriva uses albuterol as well.  Last visit here was a video visit on 14 August 2023 with Micheline Maze, NP.  No hospitalizations since his last visit.  HPI Discussed the use of AI scribe software for clinical note transcription with the patient, who gave verbal consent to proceed.  History of Present Illness   The patient, with a known history of COPD, presents with complaints of morning congestion and a nasally voice. He attributes these symptoms to a minor cold. He reports occasional coughing, primarily in the morning, with expectoration of light brown sputum. He denies any use of antibiotics, but has been taking Dayquil for symptom management.  The patient admits to smoking two small cigars daily, a reduction from his previous habit. He also reports occasional wheezing and the need for a rescue inhaler during exertion or heavy walking.  He is currently on Advair and Spiriva for his COPD management. He also has a history of heart disease and is on warfarin. He recently underwent a lung cancer screening scan in October, which was reported as lung RADS 2. He also had an ultrasound of his carotid arteries, the results of which are being  addressed by vascular surgery.  He reports compliance with nocturnal oxygen.  The patient is also in the process of applying for disability due to the advanced stage of his COPD, issues with seizure disorder and ongoing ethanol abuse.   DATA 04/27/2022 PFTs: FEV1 1.43 L or 43% predicted, FVC 2.39 L or 57% predicted, FEV1/FVC 60%, no bronchodilator response.  There is significant air trapping.  Mild restrictive physiology on the basis of obesity (ERV 37%) diffusion capacity moderately reduced.  Consistent with severe obstructive defect with concomitant mild restrictive defect on the basis of obesity. 03/14/2023 PFTs: FEV1 1.10 L or 28% predicted, FVC 2.34 L or 46% predicted, FEV1/FVC 47%, no bronchodilator response.  Patient unable to perform DLCO due to poor understanding of instructions. 08/16/2023 LDCT chest: Centrilobular and paraseptal emphysema.  Right lung nodule identified.  Patient's pulmonary nodules of mass.  Enlargement of pleural tract/main pulmonary arteries suggestive of pulmonary arterial hypertension.  RADS 2.  Benign appearance and behavior.  Review of Systems A 10 point review of systems was performed and it is as noted above otherwise negative.   Patient Active Problem List   Diagnosis Date Noted   Nocturnal hypoxemia 06/30/2023   Upper GI bleed 04/26/2023   Blood loss anemia 04/26/2023   AKI (acute kidney injury) (HCC) 04/26/2023   Hyperkalemia 04/26/2023   Alcohol use disorder 04/26/2023   Persistent atrial fibrillation (HCC) 12/19/2022   Seizure disorder (HCC) 11/11/2022   Acute metabolic encephalopathy 11/11/2022   Thrombocytopenia (HCC) 11/11/2022   Elevated LFTs 11/11/2022   Atrial fibrillation  with RVR (HCC) 11/11/2022   Right knee pain 11/11/2022   Hypomagnesemia 11/11/2022   Prediabetes 08/24/2022   Acute CHF (congestive heart failure) (HCC) 12/21/2021   Alcoholic intoxication without complication (HCC) 02/22/2021   Chronic diastolic CHF (congestive heart  failure) (HCC) 09/08/2020   Iron deficiency anemia due to chronic blood loss    Family history of colon cancer 06/28/2016   Seizures (HCC) 04/30/2015   Hypertension 04/30/2015   Alcohol abuse 04/30/2015   Epilepsy (HCC) 04/30/2015   Tobacco dependence 04/30/2015   COPD (chronic obstructive pulmonary disease) (HCC) 04/30/2015   Lumbago 04/30/2015   Hypercholesterolemia 04/30/2015   Chronic pain 04/30/2015   Neck pain 04/22/2013    Social History   Tobacco Use   Smoking status: Every Day    Average packs/day: 1.5 packs/day for 30.0 years (45.0 ttl pk-yrs)    Types: Cigarettes, Cigars    Start date: 30    Last attempt to quit: 2021    Years since quitting: 3.9   Smokeless tobacco: Former   Tobacco comments:    2 cigars a day- 10/16/2023 khj    Quit smoking cigarettes 3 years ago.  Smokes cigarettes since he was 52 years old.  Substance Use Topics   Alcohol use: Yes    Alcohol/week: 28.0 standard drinks of alcohol    Types: 7 Cans of beer, 21 Shots of liquor per week    Comment: pint of liquor per day    No Known Allergies  Current Meds  Medication Sig   albuterol (VENTOLIN HFA) 108 (90 Base) MCG/ACT inhaler Inhale 2 puffs into the lungs every 6 (six) hours as needed for wheezing or shortness of breath.   amoxicillin-clavulanate (AUGMENTIN) 875-125 MG tablet Take 1 tablet by mouth 2 (two) times daily for 7 days.   carbamazepine (TEGRETOL) 200 MG tablet Take 1 tablet (200 mg total) by mouth 2 (two) times daily.   empagliflozin (JARDIANCE) 10 MG TABS tablet Take 1 tablet (10 mg total) by mouth daily before breakfast.   ferrous sulfate (SV IRON) 325 (65 FE) MG tablet Take 1 tablet by mouth once daily   fluticasone-salmeterol (ADVAIR DISKUS) 100-50 MCG/ACT AEPB Inhale 1 puff into the lungs 2 (two) times daily.   folic acid (FOLVITE) 1 MG tablet Take 1 tablet (1 mg total) by mouth daily.   furosemide (LASIX) 40 MG tablet Take 1 tablet (40 mg total) by mouth every morning.    losartan (COZAAR) 25 MG tablet Take 0.5 tablets (12.5 mg total) by mouth at bedtime.   Magnesium Oxide -Mg Supplement (MAG-OXIDE) 200 MG TABS Take 1 tablet (200 mg total) by mouth 2 (two) times daily.   methylPREDNISolone (MEDROL DOSEPAK) 4 MG TBPK tablet Take as directed in the package.   metoprolol tartrate (LOPRESSOR) 25 MG tablet Take 1 tablet (25 mg total) by mouth 2 (two) times daily.   potassium chloride (KLOR-CON) 10 MEQ tablet Take 20 mEq by mouth daily.   rosuvastatin (CRESTOR) 20 MG tablet Take 1 tablet (20 mg total) by mouth daily.   spironolactone (ALDACTONE) 25 MG tablet Take 0.5 tablets (12.5 mg total) by mouth daily.   Tiotropium Bromide Monohydrate (SPIRIVA RESPIMAT) 2.5 MCG/ACT AERS Inhale 2 puffs into the lungs daily.   warfarin (COUMADIN) 5 MG tablet Take 1-2 tablets daily as instructed by the warfarin clinic    Immunization History  Administered Date(s) Administered   Influenza,inj,Quad PF,6+ Mos 08/17/2022   Influenza-Unspecified 08/22/2019   PFIZER(Purple Top)SARS-COV-2 Vaccination 09/14/2020, 10/19/2020   PNEUMOCOCCAL CONJUGATE-20  08/26/2022        Objective:     BP 134/86 (BP Location: Right Arm, Cuff Size: Large)   Pulse 74   Temp (!) 97.1 F (36.2 C)   Ht 5\' 10"  (1.778 m)   Wt 221 lb 12.8 oz (100.6 kg)   SpO2 95%   BMI 31.82 kg/m   SpO2: 95 % O2 Device: None (Room air)  GENERAL: Well-developed, overweight gentleman, no acute distress, no gait disturbance, very nasal quality to speech.  No conversational dyspnea. HEAD: Normocephalic, atraumatic.  EYES: Pupils equal, round, reactive to light.  No scleral icterus.  MOUTH: Poor dentition, oral mucosa moist. NECK: Supple. No thyromegaly. Trachea midline. No JVD.  No adenopathy. PULMONARY: Good air entry bilaterally.  Scattered wheezing and rhonchi throughout. CARDIOVASCULAR: S1 and S2. Regular rate and rhythm.  No rubs, murmurs or gallops heard. ABDOMEN: Benign. MUSCULOSKELETAL: No joint deformity,  mild early clubbing, no edema.  NEUROLOGIC: No overt focal deficit, no gait disturbance, speech is fluent. SKIN: Intact,warm,dry. PSYCH: Mood and behavior normal.        Assessment & Plan:     ICD-10-CM   1. Stage 3 severe COPD by GOLD classification (HCC)  J44.9     2. Acute bronchitis with COPD (HCC)  J44.0    J20.9     3. Nocturnal hypoxemia  G47.34     4. Tobacco dependence  F17.200     5. Alcohol abuse  F10.10      Meds ordered this encounter  Medications   methylPREDNISolone (MEDROL DOSEPAK) 4 MG TBPK tablet    Sig: Take as directed in the package.    Dispense:  21 tablet    Refill:  0   amoxicillin-clavulanate (AUGMENTIN) 875-125 MG tablet    Sig: Take 1 tablet by mouth 2 (two) times daily for 7 days.    Dispense:  14 tablet    Refill:  0   Discussion:    Bronchitis Acute bronchitis with wheezing, likely exacerbated by smoking. Symptoms include morning congestion, occasional light brown sputum, and wheezing. No signs of sinus infection. Currently on Advair and Spiriva for COPD and uses a rescue inhaler as needed. Informed consent provided regarding prednisone taper and antibiotic, including potential side effects and interactions with warfarin. Treatment should resolve symptoms quickly. - Prescribe methylprednisolone taper - Prescribe antibiotic that does not interfere with warfarin (Augmentin 875 mg, 1 tablet twice a day x 7 days) - Advise smoking cessation/curb alcohol - Follow up in 4-6 weeks to ensure resolution  Chronic Obstructive Pulmonary Disease (COPD) Advanced COPD managed with Advair and Spiriva. Reports occasional use of rescue inhaler, especially with physical exertion. Recent lung cancer screening scan in October was unremarkable. Smoking cessation strongly advised to improve respiratory function and overall health. - Continue Advair and Spiriva - Continue use of rescue inhaler as needed - Monitor symptoms and adjust treatment as  necessary  General Health Maintenance Smoker, which exacerbates respiratory conditions. Encouraged to quit smoking to improve overall health and respiratory function. - Advised smoking cessation - Advised curbing alcohol gradually  Follow-up - Follow up in 4-6 weeks.     Advised if symptoms do not improve or worsen, to please contact office for sooner follow up or seek emergency care.    I spent 45 minutes of dedicated to the care of this patient on the date of this encounter to include pre-visit review of records, face-to-face time with the patient discussing conditions above, post visit ordering of testing, clinical documentation  with the electronic health record, making appropriate referrals as documented, and communicating necessary findings to members of the patients care team.     C. Danice Goltz, MD Advanced Bronchoscopy PCCM [facility]    *This note was generated using voice recognition software/Dragon and/or AI transcription program.  Despite best efforts to proofread, errors can occur which can change the meaning. Any transcriptional errors that result from this process are unintentional and may not be fully corrected at the time of dictation.

## 2023-10-17 ENCOUNTER — Other Ambulatory Visit: Payer: Self-pay | Admitting: Student

## 2023-10-17 DIAGNOSIS — R29898 Other symptoms and signs involving the musculoskeletal system: Secondary | ICD-10-CM

## 2023-10-18 ENCOUNTER — Other Ambulatory Visit: Payer: Self-pay | Admitting: Student

## 2023-10-18 ENCOUNTER — Ambulatory Visit: Payer: Medicaid Other | Attending: Internal Medicine

## 2023-10-18 DIAGNOSIS — I4819 Other persistent atrial fibrillation: Secondary | ICD-10-CM | POA: Diagnosis not present

## 2023-10-18 DIAGNOSIS — R202 Paresthesia of skin: Secondary | ICD-10-CM

## 2023-10-18 DIAGNOSIS — Z5181 Encounter for therapeutic drug level monitoring: Secondary | ICD-10-CM | POA: Diagnosis not present

## 2023-10-18 DIAGNOSIS — R42 Dizziness and giddiness: Secondary | ICD-10-CM

## 2023-10-18 LAB — POCT INR: INR: 1.4 — AB (ref 2.0–3.0)

## 2023-10-18 NOTE — Patient Instructions (Signed)
Continue 2 TABLETS DAILY, EXCEPT 1 TABLET ON SUNDAY, WEDNESDAY AND SATURDAY.  INR in 3 weeks.  Augmentin/Prednisone 1 week.  While on medication reduce Warfarin to 1 tablet next Monday and Tuesday.

## 2023-10-21 ENCOUNTER — Ambulatory Visit: Admission: RE | Admit: 2023-10-21 | Payer: Medicaid Other | Source: Ambulatory Visit

## 2023-10-21 ENCOUNTER — Ambulatory Visit
Admission: RE | Admit: 2023-10-21 | Discharge: 2023-10-21 | Disposition: A | Discharge status: Home / Self Care | Payer: Medicaid Other | Source: Ambulatory Visit | Attending: Student | Admitting: Student

## 2023-10-21 DIAGNOSIS — M4802 Spinal stenosis, cervical region: Secondary | ICD-10-CM | POA: Diagnosis not present

## 2023-10-21 DIAGNOSIS — R202 Paresthesia of skin: Secondary | ICD-10-CM | POA: Diagnosis not present

## 2023-10-21 DIAGNOSIS — R42 Dizziness and giddiness: Secondary | ICD-10-CM | POA: Diagnosis not present

## 2023-10-21 DIAGNOSIS — M5023 Other cervical disc displacement, cervicothoracic region: Secondary | ICD-10-CM | POA: Diagnosis not present

## 2023-10-21 DIAGNOSIS — R29898 Other symptoms and signs involving the musculoskeletal system: Secondary | ICD-10-CM | POA: Insufficient documentation

## 2023-10-21 DIAGNOSIS — M50222 Other cervical disc displacement at C5-C6 level: Secondary | ICD-10-CM | POA: Diagnosis not present

## 2023-10-23 ENCOUNTER — Ambulatory Visit: Admission: RE | Admit: 2023-10-23 | Payer: Medicaid Other | Source: Ambulatory Visit

## 2023-10-25 ENCOUNTER — Other Ambulatory Visit: Payer: Self-pay | Admitting: Physician Assistant

## 2023-10-25 NOTE — Telephone Encounter (Signed)
Requested medication (s) are due for refill today: yes  Requested medication (s) are on the active medication list: yes  Last refill:  07/27/23 #90  Future visit scheduled: yes  Notes to clinic:  Rx end date 10/25/23//has appt tomorrow   Requested Prescriptions  Pending Prescriptions Disp Refills   folic acid (FOLVITE) 1 MG tablet [Pharmacy Med Name: Folic Acid 1 MG Oral Tablet] 90 tablet 0    Sig: Take 1 tablet by mouth once daily     Endocrinology:  Vitamins Passed - 10/25/2023  2:01 PM      Passed - Valid encounter within last 12 months    Recent Outpatient Visits           3 months ago Chronic diastolic CHF (congestive heart failure) (HCC)   [place] [facility] Mecum, Erin E, PA-C   6 months ago Persistent atrial fibrillation [facility])   [place] [facility] [name], NP   7 months ago Chronic heart failure with preserved ejection fraction [facility])   [place] [facility] [name], NP   10 months ago Hypomagnesemia   [place] [facility] [name], NP   11 months ago Hospital discharge follow-up   [place] [facility] [name], NP       Future Appointments             Tomorrow [name], NP [place] [facility]

## 2023-10-26 ENCOUNTER — Encounter: Payer: Self-pay | Admitting: Nurse Practitioner

## 2023-10-26 ENCOUNTER — Ambulatory Visit: Payer: Medicaid Other | Admitting: Nurse Practitioner

## 2023-10-26 ENCOUNTER — Encounter: Payer: Self-pay | Admitting: Pulmonary Disease

## 2023-10-26 VITALS — BP 120/79 | HR 81 | Temp 97.4°F | Ht 70.0 in | Wt 214.6 lb

## 2023-10-26 DIAGNOSIS — I4891 Unspecified atrial fibrillation: Secondary | ICD-10-CM | POA: Diagnosis not present

## 2023-10-26 DIAGNOSIS — Z23 Encounter for immunization: Secondary | ICD-10-CM | POA: Diagnosis not present

## 2023-10-26 DIAGNOSIS — I5032 Chronic diastolic (congestive) heart failure: Secondary | ICD-10-CM | POA: Diagnosis not present

## 2023-10-26 DIAGNOSIS — J441 Chronic obstructive pulmonary disease with (acute) exacerbation: Secondary | ICD-10-CM | POA: Diagnosis not present

## 2023-10-26 DIAGNOSIS — G40909 Epilepsy, unspecified, not intractable, without status epilepticus: Secondary | ICD-10-CM

## 2023-10-26 DIAGNOSIS — F10920 Alcohol use, unspecified with intoxication, uncomplicated: Secondary | ICD-10-CM

## 2023-10-26 DIAGNOSIS — I10 Essential (primary) hypertension: Secondary | ICD-10-CM

## 2023-10-26 DIAGNOSIS — D500 Iron deficiency anemia secondary to blood loss (chronic): Secondary | ICD-10-CM | POA: Diagnosis not present

## 2023-10-26 DIAGNOSIS — D696 Thrombocytopenia, unspecified: Secondary | ICD-10-CM | POA: Diagnosis not present

## 2023-10-26 MED ORDER — EMPAGLIFLOZIN 10 MG PO TABS: 10.0000 mg | ORAL_TABLET | Freq: Every day | ORAL | 1 refills | Status: DC

## 2023-10-26 MED ORDER — VALACYCLOVIR HCL 1 G PO TABS: 1000.0000 mg | ORAL_TABLET | Freq: Two times a day (BID) | ORAL | 0 refills | Status: AC

## 2023-10-26 NOTE — Assessment & Plan Note (Signed)
Chronic.  Has cut back from 1 pint per day to 1/2 pint per day and sometimes less.  Praised for improvement.  Encouraged continuing to decrease use of alcohol.

## 2023-10-26 NOTE — Progress Notes (Signed)
BP 120/79 (BP Location: Left Arm, Patient Position: Sitting, Cuff Size: Normal)   Pulse 81   Temp (!) 97.4 F (36.3 C) (Oral)   Ht 5\' 10"  (1.778 m)   Wt 214 lb 9.6 oz (97.3 kg)   SpO2 95%   BMI 30.79 kg/m    Subjective:    Patient ID: Jose Schuller., male    DOB: 05-30-1971, 52 y.o.   MRN: [id number]  HPI: Jose Prasse. is a 52 y.o. male  Chief Complaint  Patient presents with   3 month follow up   COPD   Hyperlipidemia   Hypertension   Seizures   Drug / Alcohol Assessment   SPOT    Knot underneath the left side of tonge would like to have evaluated, has been present for about 2 months    HYPERTENSION / HYPERLIPIDEMIA/HF Patient has seen Cardiology recently.  Started on Warfarin for anticoagulation.  Has been attending INR check appts.   He continues to have ongoing exertional SOB.  PFTs completed and showed severe COPD.  He does have NYHA class III symptoms. He appears euvolemic at visit today.  Does not weigh regularly at home. Satisfied with current treatment? yes Duration of hypertension: years BP monitoring frequency: not checking BP range:  BP medication side effects: no Past BP meds:  Lasix, amlodipine, carvedilol, and lisinopril Duration of hyperlipidemia: years Cholesterol medication side effects: no Cholesterol supplements: none Past cholesterol medications: rosuvastatin (crestor) Medication compliance: excellent compliance Aspirin: no Recent stressors: no Recurrent headaches: no Visual changes: no Palpitations: no Dyspnea: yes Chest pain: no Lower extremity edema: no Dizzy/lightheaded: no  ALCOHOL USE Drinking about a half a pint a day.  Sometimes he doesn't even drink a half a pint per day.    COPD Now followed by Pulmonology.  Only has SOB when he exerts himself.   COPD status: controlled Satisfied with current treatment?: no Oxygen use: no Dyspnea frequency: with exertion Cough frequency: no Rescue inhaler frequency:   Limitation of  activity: yes Productive cough:  Last Spirometry:  Pneumovax: Up to Date Influenza: Up to Date Still smokes 2 cigars daily.    Patient states he has a sore on his tongue.  It has been there for about 2 months.  Getting a little bit better but he feels it when he moves his tongue all around.   Relevant past medical, surgical, family and social history reviewed and updated as indicated. Interim medical history since our last visit reviewed. Allergies and medications reviewed and updated.  Review of Systems  Eyes:  Negative for visual disturbance.  Respiratory:  Positive for shortness of breath. Negative for chest tightness.   Cardiovascular:  Negative for chest pain, palpitations and leg swelling.  Neurological:  Negative for dizziness, light-headedness and headaches.    Per HPI unless specifically indicated above     Objective:    BP 120/79 (BP Location: Left Arm, Patient Position: Sitting, Cuff Size: Normal)   Pulse 81   Temp (!) 97.4 F (36.3 C) (Oral)   Ht 5\' 10"  (1.778 m)   Wt 214 lb 9.6 oz (97.3 kg)   SpO2 95%   BMI 30.79 kg/m   Wt Readings from Last 3 Encounters:  10/26/23 214 lb 9.6 oz (97.3 kg)  10/16/23 221 lb 12.8 oz (100.6 kg)  08/23/23 215 lb (97.5 kg)    Physical Exam Vitals and nursing note reviewed.  Constitutional:      General: He is not in acute distress.  Appearance: Normal appearance. He is not ill-appearing, toxic-appearing or diaphoretic.  HENT:     Head: Normocephalic.     Right Ear: External ear normal.     Left Ear: External ear normal.     Nose: Nose normal. No congestion or rhinorrhea.     Mouth/Throat:     Mouth: Mucous membranes are moist.  Eyes:     General:        Right eye: No discharge.        Left eye: No discharge.     Extraocular Movements: Extraocular movements intact.     Conjunctiva/sclera: Conjunctivae normal.     Pupils: Pupils are equal, round, and reactive to light.  Cardiovascular:     Rate and Rhythm: Normal  rate. Rhythm irregular.     Heart sounds: No murmur heard. Pulmonary:     Effort: Pulmonary effort is normal. No respiratory distress.     Breath sounds: Wheezing present. No rhonchi or rales.  Abdominal:     General: Abdomen is flat. Bowel sounds are normal.  Musculoskeletal:     Cervical back: Normal range of motion and neck supple.  Skin:    General: Skin is warm and dry.     Capillary Refill: Capillary refill takes less than 2 seconds.  Neurological:     General: No focal deficit present.     Mental Status: He is alert and oriented to person, place, and time.  Psychiatric:        Mood and Affect: Mood normal.        Behavior: Behavior normal.        Thought Content: Thought content normal.        Judgment: Judgment normal.     Results for orders placed or performed in visit on 10/18/23  POCT INR   Collection Time: 10/18/23  3:53 PM  Result Value Ref Range   INR 1.4 (A) 2.0 - 3.0   POC INR        Assessment & Plan:   Problem List Items Addressed This Visit       Cardiovascular and Mediastinum   Hypertension   Chronic diastolic CHF (congestive heart failure) (HCC)   Chronic, historic condition Appears managed with Jardiance 10 mg PO every day, Lasix 40 mg PO every day  Cardiology appears to be monitoring and managing medications Appears Euvolemic in office today.  Continue current regimen and Cardiology follow ups Follow up in 3 months or sooner if concerns arise   - Reminded to call for an overnight weight gain of >2 pounds or a weekly weight gain of >5 pounds - not adding salt to food and read food labels. Reviewed the importance of keeping daily sodium intake to 2000mg  daily. - Avoid Ibuprofen products.       Atrial fibrillation with RVR (HCC)   Chronic.  Managed by Cardiology.  On Coumadin due to drug interactions with other anticoagulants.  INR managed by Cardiology.         Respiratory   COPD (chronic obstructive pulmonary disease) (HCC)   Chronic,  ongoing  Patient is followed by Pulmonology  Currently on Breztri. He is using Albuterol inhaler about 2 times per day - unsure of neb use  Continue current regimen for now, will collaborate with Pulm for management  Encouraged smoking cessation. Follow up in 3 months or sooner if concerns arise         Nervous and Auditory   Epilepsy (HCC)   Chronic.  Followed by  Neurology.  On tegretol which is managed by Neurology.  Recommend gradual cessation of alcohol. Last seizure was January 2024 while acutely withdrawing from alcohol.        Hematopoietic and Hemostatic   Thrombocytopenia (HCC)   Secondary to alcohol use.  Platelets on 11/26 were 198.         Other   Iron deficiency anemia due to chronic blood loss   Labs reviewed from recent Neurology visit on 11/16.  H/H well controlled.      Alcoholic intoxication without complication (HCC) - Primary   Chronic.  Has cut back from 1 pint per day to 1/2 pint per day and sometimes less.  Praised for improvement.  Encouraged continuing to decrease use of alcohol.      Other Visit Diagnoses       Need for influenza vaccination       Relevant Orders   Flu vaccine trivalent PF, 6mos and older(Flulaval,Afluria,Fluarix,Fluzone)         Follow up plan: Return in about 3 months (around 01/24/2024) for HTN, HLD, DM2 FU.

## 2023-10-26 NOTE — Assessment & Plan Note (Signed)
Secondary to alcohol use.  Platelets on 11/26 were 198.

## 2023-10-26 NOTE — Assessment & Plan Note (Signed)
Chronic.  Followed by Neurology.  On tegretol which is managed by Neurology.  Recommend gradual cessation of alcohol. Last seizure was January 2024 while acutely withdrawing from alcohol.

## 2023-10-26 NOTE — Assessment & Plan Note (Signed)
Chronic.  Managed by Cardiology.  On Coumadin due to drug interactions with other anticoagulants.  INR managed by Cardiology.

## 2023-10-26 NOTE — Assessment & Plan Note (Signed)
Labs reviewed from recent Neurology visit on 11/16.  H/H well controlled.

## 2023-10-26 NOTE — Assessment & Plan Note (Signed)
Chronic, ongoing  Patient is followed by Pulmonology  Currently on Breztri. He is using Albuterol inhaler about 2 times per day - unsure of neb use  Continue current regimen for now, will collaborate with Pulm for management  Encouraged smoking cessation. Follow up in 3 months or sooner if concerns arise

## 2023-10-26 NOTE — Assessment & Plan Note (Signed)
Chronic, historic condition Appears managed with Jardiance 10 mg PO every day, Lasix 40 mg PO every day  Cardiology appears to be monitoring and managing medications Appears Euvolemic in office today.  Continue current regimen and Cardiology follow ups Follow up in 3 months or sooner if concerns arise   - Reminded to call for an overnight weight gain of >2 pounds or a weekly weight gain of >5 pounds - not adding salt to food and read food labels. Reviewed the importance of keeping daily sodium intake to 2000mg  daily. - Avoid Ibuprofen products.

## 2023-10-30 ENCOUNTER — Telehealth: Payer: Self-pay | Admitting: Nurse Practitioner

## 2023-10-30 NOTE — Telephone Encounter (Signed)
Jose [name] from [facility] was made aware of [name], NP recommendations. [name] states she would sent his Pulmonologist a Plan of Care. Verbalized understanding and disconnected the call.

## 2023-10-30 NOTE — Telephone Encounter (Signed)
Copied from CRM [id number]. Topic: Appointment Scheduling - Scheduling Inquiry for Clinic >> Oct 30, 2023 12:13 PM Marlow Baars wrote: Reason for CRM: Glori Bickers RN with [facility] called in inquiring about orders she put in weeks ago that need to be addressed. She was told as soon as the patient has his ov on 12/19 she would get the orders. She still has not received anything. Please assist her further as she cannot go see him until she receives the orders.

## 2023-10-30 NOTE — Telephone Encounter (Signed)
Patient see's pulmonology and was already treated for a COPD exacerbation.  I do not manage his Oxygen. If she has questions regarding that she should call Pulm.  He is on Metoprolol 25mg  BID- this is prescribed by Cardiology.  Patient is not on Cardizem or entresto.

## 2023-11-06 DIAGNOSIS — E785 Hyperlipidemia, unspecified: Secondary | ICD-10-CM

## 2023-11-06 DIAGNOSIS — J441 Chronic obstructive pulmonary disease with (acute) exacerbation: Secondary | ICD-10-CM | POA: Diagnosis not present

## 2023-11-06 DIAGNOSIS — Z87718 Personal history of other specified (corrected) congenital malformations of genitourinary system: Secondary | ICD-10-CM

## 2023-11-06 DIAGNOSIS — R202 Paresthesia of skin: Secondary | ICD-10-CM

## 2023-11-06 DIAGNOSIS — F17200 Nicotine dependence, unspecified, uncomplicated: Secondary | ICD-10-CM

## 2023-11-06 DIAGNOSIS — Z7951 Long term (current) use of inhaled steroids: Secondary | ICD-10-CM

## 2023-11-06 DIAGNOSIS — Z7984 Long term (current) use of oral hypoglycemic drugs: Secondary | ICD-10-CM

## 2023-11-06 DIAGNOSIS — G40909 Epilepsy, unspecified, not intractable, without status epilepticus: Secondary | ICD-10-CM | POA: Diagnosis not present

## 2023-11-06 DIAGNOSIS — Z604 Social exclusion and rejection: Secondary | ICD-10-CM

## 2023-11-06 DIAGNOSIS — I051 Rheumatic mitral insufficiency: Secondary | ICD-10-CM | POA: Diagnosis not present

## 2023-11-06 DIAGNOSIS — E119 Type 2 diabetes mellitus without complications: Secondary | ICD-10-CM | POA: Diagnosis not present

## 2023-11-06 DIAGNOSIS — Z7902 Long term (current) use of antithrombotics/antiplatelets: Secondary | ICD-10-CM

## 2023-11-08 DIAGNOSIS — Z419 Encounter for procedure for purposes other than remedying health state, unspecified: Secondary | ICD-10-CM | POA: Diagnosis not present

## 2023-11-15 ENCOUNTER — Ambulatory Visit: Payer: Medicaid Other | Attending: Internal Medicine

## 2023-11-15 DIAGNOSIS — I4819 Other persistent atrial fibrillation: Secondary | ICD-10-CM | POA: Diagnosis not present

## 2023-11-15 DIAGNOSIS — Z5181 Encounter for therapeutic drug level monitoring: Secondary | ICD-10-CM

## 2023-11-15 LAB — POCT INR: INR: 1.8 — AB (ref 2.0–3.0)

## 2023-11-15 NOTE — Patient Instructions (Signed)
 Continue 2 TABLETS DAILY, EXCEPT 1 TABLET ON SUNDAY, WEDNESDAY AND SATURDAY.  INR in 4 weeks. [phone]

## 2023-11-23 ENCOUNTER — Other Ambulatory Visit: Payer: Self-pay | Admitting: Family

## 2023-11-23 DIAGNOSIS — I4819 Other persistent atrial fibrillation: Secondary | ICD-10-CM

## 2023-11-28 ENCOUNTER — Ambulatory Visit: Payer: Medicaid Other | Admitting: Pulmonary Disease

## 2023-12-04 DIAGNOSIS — M5441 Lumbago with sciatica, right side: Secondary | ICD-10-CM | POA: Diagnosis not present

## 2023-12-04 DIAGNOSIS — M5442 Lumbago with sciatica, left side: Secondary | ICD-10-CM | POA: Diagnosis not present

## 2023-12-04 DIAGNOSIS — I10 Essential (primary) hypertension: Secondary | ICD-10-CM | POA: Diagnosis not present

## 2023-12-04 DIAGNOSIS — E538 Deficiency of other specified B group vitamins: Secondary | ICD-10-CM | POA: Diagnosis not present

## 2023-12-04 DIAGNOSIS — G8929 Other chronic pain: Secondary | ICD-10-CM | POA: Diagnosis not present

## 2023-12-04 DIAGNOSIS — F1090 Alcohol use, unspecified, uncomplicated: Secondary | ICD-10-CM | POA: Diagnosis not present

## 2023-12-04 DIAGNOSIS — G40909 Epilepsy, unspecified, not intractable, without status epilepticus: Secondary | ICD-10-CM | POA: Diagnosis not present

## 2023-12-04 DIAGNOSIS — I5031 Acute diastolic (congestive) heart failure: Secondary | ICD-10-CM | POA: Diagnosis not present

## 2023-12-04 DIAGNOSIS — E559 Vitamin D deficiency, unspecified: Secondary | ICD-10-CM | POA: Diagnosis not present

## 2023-12-05 ENCOUNTER — Encounter: Payer: Self-pay | Admitting: Pulmonary Disease

## 2023-12-05 ENCOUNTER — Ambulatory Visit (INDEPENDENT_AMBULATORY_CARE_PROVIDER_SITE_OTHER): Payer: Medicaid Other | Admitting: Pulmonary Disease

## 2023-12-05 VITALS — BP 130/80 | HR 83 | Temp 99.1°F | Ht 70.0 in | Wt 224.6 lb

## 2023-12-05 DIAGNOSIS — F17200 Nicotine dependence, unspecified, uncomplicated: Secondary | ICD-10-CM

## 2023-12-05 DIAGNOSIS — I4711 Inappropriate sinus tachycardia, so stated: Secondary | ICD-10-CM

## 2023-12-05 DIAGNOSIS — J449 Chronic obstructive pulmonary disease, unspecified: Secondary | ICD-10-CM

## 2023-12-05 DIAGNOSIS — G4734 Idiopathic sleep related nonobstructive alveolar hypoventilation: Secondary | ICD-10-CM | POA: Diagnosis not present

## 2023-12-05 NOTE — Progress Notes (Signed)
Subjective:    Patient ID: Jose [name]., male    DOB: 09/13/1971, 53 y.o.   MRN: [id number]  Patient Care Team: [name], NP as PCP - General Gustavus Bryant, LCSW as Social Worker (Licensed Clinical Social Worker) Salena Saner, MD as Consulting Physician (Pulmonary Disease) Noemi Chapel, NP as Nurse Practitioner (Nurse Practitioner)  Chief Complaint  Patient presents with   Follow-up    BACKGROUND/INTERVAL:Patient is a 53 year old current cigar smoker with 33-pack-year history of cigarette smoking who presents for follow-up on the issue of shortness of breath.  He has stage III COPD.  Currently maintained on Advair discus and Spiriva uses albuterol as well.  Last visit here was on 16 October 2023 with me.  No hospitalizations since his last visit.  At that time had an acute bronchitis and was treated with Medrol Dosepak and Augmentin.  No exacerbations since then.   HPI Discussed the use of AI scribe software for clinical note transcription with the patient, who gave verbal consent to proceed.  History of Present Illness   The patient, with stage 3 COPD, presents for follow-up.  He has stage 3 COPD and reports that his breathing remains stable, with no recent exacerbations necessitating additional medication. He experiences shortness of breath during ambulation.  His current treatment regimen includes Advair and Spiriva. He has used his rescue inhaler once or twice recently. He occasionally uses oxygen at night, particularly when he feels it is necessary, and last week, he used oxygen a couple of times at night.  He was reminded that this is something he should use every night.  He has significant nocturnal hypoxemia due to COPD.  Reportedly still drinking alcohol daily.  Has been counseled.  He has not participated in a pulmonary rehabilitation program.  He is uncertain about his next appointment with his cardiologist and relies on paperwork for scheduling.    DATA 04/27/2022 PFTs: FEV1 1.43 L or 43% predicted, FVC 2.39 L or 57% predicted, FEV1/FVC 60%, no bronchodilator response.  There is significant air trapping.  Mild restrictive physiology on the basis of obesity (ERV 37%) diffusion capacity moderately reduced.  Consistent with severe obstructive defect with concomitant mild restrictive defect on the basis of obesity. 03/14/2023 PFTs: FEV1 1.10 L or 28% predicted, FVC 2.34 L or 46% predicted, FEV1/FVC 47%, no bronchodilator response.  Patient unable to perform DLCO due to poor understanding of instructions. 08/16/2023 LDCT chest: Centrilobular and paraseptal emphysema.  Right lung nodule identified.  Patient's pulmonary nodules of mass.  Enlargement of pleural tract/main pulmonary arteries suggestive of pulmonary arterial hypertension.  RADS 2.  Benign appearance and behavior.  Review of Systems A 10 point review of systems was performed and it is as noted above otherwise negative.   Patient Active Problem List   Diagnosis Date Noted   Nocturnal hypoxemia 06/30/2023   Upper GI bleed 04/26/2023   Blood loss anemia 04/26/2023   AKI (acute kidney injury) (HCC) 04/26/2023   Hyperkalemia 04/26/2023   Alcohol use disorder 04/26/2023   Persistent atrial fibrillation (HCC) 12/19/2022   Seizure disorder (HCC) 11/11/2022   Acute metabolic encephalopathy 11/11/2022   Thrombocytopenia (HCC) 11/11/2022   Elevated LFTs 11/11/2022   Atrial fibrillation with RVR (HCC) 11/11/2022   Right knee pain 11/11/2022   Hypomagnesemia 11/11/2022   Prediabetes 08/24/2022   Acute CHF (congestive heart failure) (HCC) 12/21/2021   Alcoholic intoxication without complication (HCC) 02/22/2021   Chronic diastolic CHF (congestive heart failure) (HCC) 09/08/2020  Iron deficiency anemia due to chronic blood loss    Family history of colon cancer 06/28/2016   Seizures (HCC) 04/30/2015   Hypertension 04/30/2015   Alcohol abuse 04/30/2015   Epilepsy (HCC) 04/30/2015    Tobacco dependence 04/30/2015   COPD (chronic obstructive pulmonary disease) (HCC) 04/30/2015   Lumbago 04/30/2015   Hypercholesterolemia 04/30/2015   Chronic pain 04/30/2015   Neck pain 04/22/2013    Social History   Tobacco Use   Smoking status: Every Day    Average packs/day: 1.5 packs/day for 30.0 years (45.0 ttl pk-yrs)    Types: Cigarettes, Cigars    Start date: 66    Last attempt to quit: 2021    Years since quitting: 4.0   Smokeless tobacco: Former   Tobacco comments:    2 cigars a day- 10/16/2023 khj    Quit smoking cigarettes 3 years ago.  Smokes cigarettes since he was 53 years old.    Smoking 2 cigars per day.  12/05/2023 hfb RN  Substance Use Topics   Alcohol use: Yes    Alcohol/week: 28.0 standard drinks of alcohol    Types: 7 Cans of beer, 21 Shots of liquor per week    Comment: pint of liquor per day    No Known Allergies  Current Meds  Medication Sig   albuterol (VENTOLIN HFA) 108 (90 Base) MCG/ACT inhaler Inhale 2 puffs into the lungs every 6 (six) hours as needed for wheezing or shortness of breath.   carbamazepine (TEGRETOL) 200 MG tablet Take 1 tablet (200 mg total) by mouth 2 (two) times daily.   empagliflozin (JARDIANCE) 10 MG TABS tablet Take 1 tablet (10 mg total) by mouth daily before breakfast.   ferrous sulfate (SV IRON) 325 (65 FE) MG tablet Take 1 tablet by mouth once daily   fluticasone-salmeterol (ADVAIR DISKUS) 100-50 MCG/ACT AEPB Inhale 1 puff into the lungs 2 (two) times daily.   folic acid (FOLVITE) 1 MG tablet Take 1 tablet by mouth once daily   furosemide (LASIX) 40 MG tablet Take 1 tablet (40 mg total) by mouth every morning.   losartan (COZAAR) 25 MG tablet Take 0.5 tablets (12.5 mg total) by mouth at bedtime.   Magnesium Oxide -Mg Supplement (MAG-OXIDE) 200 MG TABS Take 1 tablet (200 mg total) by mouth 2 (two) times daily.   metoprolol tartrate (LOPRESSOR) 25 MG tablet Take 1 tablet (25 mg total) by mouth 2 (two) times daily.    potassium chloride (KLOR-CON) 10 MEQ tablet Take 20 mEq by mouth daily.   potassium chloride (MICRO-K) 10 MEQ CR capsule Take 2 capsules (20 mEq total) by mouth daily.   rosuvastatin (CRESTOR) 20 MG tablet Take 1 tablet (20 mg total) by mouth daily.   spironolactone (ALDACTONE) 25 MG tablet Take 0.5 tablets (12.5 mg total) by mouth daily.   Tiotropium Bromide Monohydrate (SPIRIVA RESPIMAT) 2.5 MCG/ACT AERS Inhale 2 puffs into the lungs daily.   warfarin (COUMADIN) 5 MG tablet Take 1-2 tablets daily as instructed by the warfarin clinic    Immunization History  Administered Date(s) Administered   Influenza, Seasonal, Injecte, Preservative Fre 10/26/2023   Influenza,inj,Quad PF,6+ Mos 08/17/2022   Influenza-Unspecified 08/22/2019   PFIZER(Purple Top)SARS-COV-2 Vaccination 09/14/2020, 10/19/2020   PNEUMOCOCCAL CONJUGATE-20 08/26/2022        Objective:     BP 130/80 (BP Location: Right Arm, Patient Position: Sitting, Cuff Size: Large)   Pulse 83   Temp 99.1 F (37.3 C) (Oral)   Ht 5\' 10"  (1.778 m)  Wt 224 lb 9.6 oz (101.9 kg)   SpO2 95%   BMI 32.23 kg/m   SpO2: 95 % O2 Device: None (Room air)  GENERAL: Well-developed, overweight gentleman, no acute distress, no gait disturbance, very nasal quality to speech.  No conversational dyspnea. HEAD: Normocephalic, atraumatic.  EYES: Pupils equal, round, reactive to light.  No scleral icterus.  MOUTH: Poor dentition, oral mucosa moist. NECK: Supple. No thyromegaly. Trachea midline. No JVD.  No adenopathy. PULMONARY: Good air entry bilaterally.  Coarse, otherwise no adventitious sounds. CARDIOVASCULAR: S1 and S2. Regular rate and rhythm.  No rubs, murmurs or gallops heard. ABDOMEN: Benign. MUSCULOSKELETAL: No joint deformity, mild early clubbing, no edema.  NEUROLOGIC: No overt focal deficit, no gait disturbance, speech is fluent. SKIN: Intact,warm,dry. PSYCH: Mood and behavior normal.     Ambulatory oxymetry was performed today:   At rest on room air oxygen saturation was 95%, the patient ambulated at a slow pace, completed 2 laps, O2 nadir 93 %, mild shortness of breath.  Resting heart rate was 77 bpm at maximum for this exercise 134 bpm.  There was inappropriate heart rate response to exercise.    Assessment & Plan:     ICD-10-CM   1. Stage 3 severe COPD by GOLD classification (HCC)  J44.9 AMB referral to pulmonary rehabilitation    2. Nocturnal hypoxemia  G47.34     3. Inappropriate sinus tachycardia (HCC)  I47.11     4. Tobacco dependence  F17.200       Orders Placed This Encounter  Procedures   AMB referral to pulmonary rehabilitation    Referral Priority:   Routine    Referral Type:   Consultation    Number of Visits Requested:   1   Discussion:    Chronic Obstructive Pulmonary Disease (COPD) - Stage 3 Stage 3 COPD with well-managed symptoms. No recent exacerbations requiring medication. Continues on Advair and Spiriva with occasional use of rescue inhaler. Reports dyspnea with ambulation. Oxygen saturation maintained during walking, but rapid heart rate increase noted.  This was an inappropriate response as it occurred quickly.  Uses supplemental oxygen intermittently at night; advised consistent use during sleep. Discussed pulmonary rehabilitation options: in-person at the cardiopulmonary rehab center three times a week or a home-based program with online support. Recommended in-person rehab for better outcomes due to one-on-one interaction and peer support. - Recommend consistent use of supplemental oxygen during sleep - Recommend in-person pulmonary rehabilitation at the cardiopulmonary rehab center three times a week - Follow up in three months  Tachycardia Rapid heart rate increase during ambulation. Follows with a cardiologist. Ensure follow-up for potential medication adjustments. - Ensure follow-up with cardiologist for potential medication adjustments - Has had history of A-fib with RVR  -  Suspect also effect of alcohol, counseled  Follow-up - Schedule follow-up appointment in three months.    Advised if symptoms do not improve or worsen, to please contact office for sooner follow up or seek emergency care.    I spent 32 minutes of dedicated to the care of this patient on the date of this encounter to include pre-visit review of records, face-to-face time with the patient discussing conditions above, post visit ordering of testing, clinical documentation with the electronic health record, making appropriate referrals as documented, and communicating necessary findings to members of the patients care team.     C. Danice Goltz, MD Advanced Bronchoscopy P[facility]-    *This note was generated using voice recognition software/Dragon and/or AI transcription  program.  Despite best efforts to proofread, errors can occur which can change the meaning. Any transcriptional errors that result from this process are unintentional and may not be fully corrected at the time of dictation.

## 2023-12-05 NOTE — Patient Instructions (Signed)
VISIT SUMMARY:  You came in today for a follow-up visit regarding your stage 3 COPD. You reported that your breathing has been stable with no recent exacerbations, although you experience shortness of breath during walking. You are currently using Advair and Spiriva, and occasionally your rescue inhaler. You also use supplemental oxygen at night when needed. We discussed your treatment plan and options for pulmonary rehabilitation.  YOUR PLAN:  -CHRONIC OBSTRUCTIVE PULMONARY DISEASE (COPD) - STAGE 3: Stage 3 COPD means that your lung function is significantly reduced, causing breathing difficulties. Your symptoms are well-managed with Advair and Spiriva, and you occasionally use a rescue inhaler. You should use supplemental oxygen consistently during sleep. We also recommend participating in an in-person pulmonary rehabilitation program at the cardiopulmonary rehab center three times a week for better outcomes.  -TACHYCARDIA: Tachycardia is a condition where your heart rate increases rapidly. You experience this during walking. It is important to follow up with your cardiologist to see if any medication adjustments are needed.  INSTRUCTIONS:  Please ensure you follow up with your cardiologist for potential medication adjustments. Schedule your next follow-up appointment with [place] in three months.

## 2023-12-09 DIAGNOSIS — Z419 Encounter for procedure for purposes other than remedying health state, unspecified: Secondary | ICD-10-CM | POA: Diagnosis not present

## 2023-12-11 DIAGNOSIS — E538 Deficiency of other specified B group vitamins: Secondary | ICD-10-CM | POA: Diagnosis not present

## 2023-12-12 ENCOUNTER — Encounter: Payer: Self-pay | Admitting: Pulmonary Disease

## 2023-12-13 ENCOUNTER — Ambulatory Visit: Payer: Medicaid Other | Attending: Internal Medicine

## 2023-12-13 DIAGNOSIS — Z5181 Encounter for therapeutic drug level monitoring: Secondary | ICD-10-CM

## 2023-12-13 DIAGNOSIS — I4819 Other persistent atrial fibrillation: Secondary | ICD-10-CM

## 2023-12-13 LAB — POCT INR: INR: 1.4 — AB (ref 2.0–3.0)

## 2023-12-13 NOTE — Patient Instructions (Signed)
 Take 2 tablets tonight only then Continue 2 TABLETS DAILY, EXCEPT 1 TABLET ON SUNDAY, WEDNESDAY AND SATURDAY.  INR in 3 weeks. [phone]

## 2023-12-14 ENCOUNTER — Other Ambulatory Visit: Payer: Self-pay | Admitting: Family

## 2023-12-14 DIAGNOSIS — I4819 Other persistent atrial fibrillation: Secondary | ICD-10-CM

## 2023-12-18 DIAGNOSIS — E538 Deficiency of other specified B group vitamins: Secondary | ICD-10-CM | POA: Diagnosis not present

## 2023-12-25 DIAGNOSIS — E538 Deficiency of other specified B group vitamins: Secondary | ICD-10-CM | POA: Diagnosis not present

## 2024-01-01 ENCOUNTER — Telehealth: Payer: Self-pay

## 2024-01-01 NOTE — Telephone Encounter (Signed)
 Returned call. Made her aware pt's Warfarin dose should be 2 TABLETS DAILY, EXCEPT 1 TABLET ON SUNDAY, WEDNESDAY AND SATURDAY.

## 2024-01-01 NOTE — Telephone Encounter (Signed)
 Pt mother called in asking to clarify some instructions on how pt should be taking his coumadin.

## 2024-01-03 ENCOUNTER — Ambulatory Visit: Payer: Medicaid Other | Attending: Internal Medicine

## 2024-01-03 DIAGNOSIS — Z5181 Encounter for therapeutic drug level monitoring: Secondary | ICD-10-CM

## 2024-01-03 DIAGNOSIS — I4819 Other persistent atrial fibrillation: Secondary | ICD-10-CM | POA: Diagnosis not present

## 2024-01-03 LAB — POCT INR: INR: 4.1 — AB (ref 2.0–3.0)

## 2024-01-03 NOTE — Patient Instructions (Addendum)
 HOLD TODAY ONLY AND TAKE 1 TABLET TOMORROW then Continue 2 TABLETS DAILY, EXCEPT 1 TABLET ON SUNDAY, WEDNESDAY AND SATURDAY.  INR in 2 weeks. [phone]

## 2024-01-06 DIAGNOSIS — Z419 Encounter for procedure for purposes other than remedying health state, unspecified: Secondary | ICD-10-CM | POA: Diagnosis not present

## 2024-01-08 ENCOUNTER — Telehealth: Payer: Self-pay | Admitting: Cardiology

## 2024-01-08 NOTE — Telephone Encounter (Signed)
 Lvm to confirm pt appt for 01/09/24

## 2024-01-09 ENCOUNTER — Encounter: Payer: Medicaid Other | Admitting: Cardiology

## 2024-01-09 ENCOUNTER — Ambulatory Visit: Attending: Cardiology | Admitting: Cardiology

## 2024-01-09 VITALS — BP 137/88 | HR 75 | Wt 227.0 lb

## 2024-01-09 DIAGNOSIS — R0683 Snoring: Secondary | ICD-10-CM | POA: Diagnosis not present

## 2024-01-09 DIAGNOSIS — K5730 Diverticulosis of large intestine without perforation or abscess without bleeding: Secondary | ICD-10-CM | POA: Diagnosis not present

## 2024-01-09 DIAGNOSIS — Z79899 Other long term (current) drug therapy: Secondary | ICD-10-CM | POA: Insufficient documentation

## 2024-01-09 DIAGNOSIS — I480 Paroxysmal atrial fibrillation: Secondary | ICD-10-CM | POA: Insufficient documentation

## 2024-01-09 DIAGNOSIS — Z7984 Long term (current) use of oral hypoglycemic drugs: Secondary | ICD-10-CM | POA: Insufficient documentation

## 2024-01-09 DIAGNOSIS — E785 Hyperlipidemia, unspecified: Secondary | ICD-10-CM | POA: Insufficient documentation

## 2024-01-09 DIAGNOSIS — J449 Chronic obstructive pulmonary disease, unspecified: Secondary | ICD-10-CM | POA: Diagnosis not present

## 2024-01-09 DIAGNOSIS — I110 Hypertensive heart disease with heart failure: Secondary | ICD-10-CM | POA: Insufficient documentation

## 2024-01-09 DIAGNOSIS — Z7901 Long term (current) use of anticoagulants: Secondary | ICD-10-CM | POA: Insufficient documentation

## 2024-01-09 DIAGNOSIS — D6959 Other secondary thrombocytopenia: Secondary | ICD-10-CM | POA: Insufficient documentation

## 2024-01-09 DIAGNOSIS — F1010 Alcohol abuse, uncomplicated: Secondary | ICD-10-CM | POA: Diagnosis not present

## 2024-01-09 DIAGNOSIS — F17290 Nicotine dependence, other tobacco product, uncomplicated: Secondary | ICD-10-CM | POA: Insufficient documentation

## 2024-01-09 DIAGNOSIS — I5032 Chronic diastolic (congestive) heart failure: Secondary | ICD-10-CM | POA: Insufficient documentation

## 2024-01-09 NOTE — Progress Notes (Signed)
 [facility] NOTE  Referring Physician: [name], NP  Primary Care: [name], NP HF Cardiology: Dr. Gasper Lloyd  HPI: Jose [name]. is a 53 y.o. male with hypertension, hyperlipidemia, COPD, significant alcohol abuse, history of seizures, persistent atrial fibrillation.  Patient was admitted in 1/24 with fevers chills cough and fatigue.  He presented hypotensive to 60s/40s with new onset atrial fibrillation with RVR. TTE during admit with LVEF of 50-55%, moderate biatrial enlargement, normal RV. He was seen by neurology for possible alcohol withdrawal related seizures and continued on home tegretol (undetectable levels on admit). For his atrial fibrillation he was discharged home on cardizem & lopressor. Anticoagulation was not started due to concern for continued alcohol abuse and thrombocytopenia.   Patient was ultimately started on warfarin and had TEE-guided DCCV in 3/24.  TEE showed EF 55%, normal RV, mild LAE.    PFTs in 5/24 showed severe COPD. Cardiolite in 5/24 showed no ischemia or infarction.   Patient was admitted in 6/24 with melena and AKI, creatinine up to 2.29 and hgb down to 7.9.  He was transfused.  EGD showed a single AVM, nonbleeding, treated with APC. Colonoscopy showed diverticulosis.  Due to low BP, diltiazem CD and Entresto were stopped.  Warfarin was held while in the hospital but was restarted at discharge.   He returns today for followup of CHF and atrial fibrillation.  Still smoking 2 cigars/day.  He has cut back ETOH but still drinks 1/2 pint on most days.  This is less than prior.  He reports snoring and daytime sleepiness.  He chronically sleeps with the head of his bed elevated.  No palpitations, he is in NSR today.  No BRBPR/melena. Dyspnea if he walks "too fast," does ok if he paces himself. He is short of breath with stairs.  No chest pain. No orthopnea/PND. Weight has trended up.    REDS clip 35%  ECG (personally reviewed):  NSR, PVCs  Labs (1/24): K 3.7, creatinine 0.84, plts 345, hgb 12.3 Labs (2/24): K 4.6, creatinine 1.49, BNP 58 Labs (6/24): K 3.8, creatinine 2.29 => 0.82, hgb 11.4 => 7.9, plts 130 Labs (9/24): LDL 95, hgb 12.6, K 3.7, creatinine 0.72 Labs (11/24): K 4, creatinine 0.7  Past Medical History:  Diagnosis Date   Alcohol abuse    Cervicalgia    CHF (congestive heart failure) (HCC)    COPD (chronic obstructive pulmonary disease) (HCC)    Hyperlipidemia    Hypertension    Seizures (HCC)     Current Outpatient Medications  Medication Sig Dispense Refill   albuterol (VENTOLIN HFA) 108 (90 Base) MCG/ACT inhaler Inhale 2 puffs into the lungs every 6 (six) hours as needed for wheezing or shortness of breath. 8 g 2   carbamazepine (TEGRETOL) 200 MG tablet Take 1 tablet (200 mg total) by mouth 2 (two) times daily. 180 tablet 1   empagliflozin (JARDIANCE) 10 MG TABS tablet Take 1 tablet (10 mg total) by mouth daily before breakfast. 90 tablet 1   ferrous sulfate (SV IRON) 325 (65 FE) MG tablet Take 1 tablet by mouth once daily 90 tablet 3   fluticasone-salmeterol (ADVAIR DISKUS) 100-50 MCG/ACT AEPB Inhale 1 puff into the lungs 2 (two) times daily. 60 each 11   folic acid (FOLVITE) 1 MG tablet Take 1 tablet by mouth once daily 90 tablet 0   furosemide (LASIX) 40 MG tablet TAKE 1 TABLET BY MOUTH ONCE DAILY IN THE MORNING 90 tablet 3  losartan (COZAAR) 25 MG tablet Take 0.5 tablets (12.5 mg total) by mouth at bedtime. 45 tablet 3   Magnesium Oxide -Mg Supplement (MAG-OXIDE) 200 MG TABS Take 1 tablet (200 mg total) by mouth 2 (two) times daily. 180 tablet 1   metoprolol tartrate (LOPRESSOR) 25 MG tablet Take 1 tablet (25 mg total) by mouth 2 (two) times daily. 60 tablet 3   potassium chloride (KLOR-CON) 10 MEQ tablet Take 20 mEq by mouth daily.     potassium chloride (MICRO-K) 10 MEQ CR capsule Take 2 capsules (20 mEq total) by mouth daily. 60 capsule 2   rosuvastatin (CRESTOR) 20 MG tablet Take 1  tablet (20 mg total) by mouth daily. 90 tablet 3   spironolactone (ALDACTONE) 25 MG tablet Take 0.5 tablets (12.5 mg total) by mouth daily. 15 tablet 6   Tiotropium Bromide Monohydrate (SPIRIVA RESPIMAT) 2.5 MCG/ACT AERS Inhale 2 puffs into the lungs daily. 1 each 11   warfarin (COUMADIN) 5 MG tablet Take 1-2 tablets daily as instructed by the warfarin clinic 100 tablet 1   No current facility-administered medications for this visit.    No Known Allergies    Social History   Socioeconomic History   Marital status: Widowed    Spouse name: Not on file   Number of children: Not on file   Years of education: Not on file   Highest education level: Not on file  Occupational History   Not on file  Tobacco Use   Smoking status: Every Day    Average packs/day: 1.5 packs/day for 30.0 years (45.0 ttl pk-yrs)    Types: Cigarettes, Cigars    Start date: 51    Last attempt to quit: 2021    Years since quitting: 4.1   Smokeless tobacco: Former   Tobacco comments:    2 cigars a day- 10/16/2023 khj    Quit smoking cigarettes 3 years ago.  Smokes cigarettes since he was 54 years old.    Smoking 2 cigars per day.  12/05/2023 hfb RN  Vaping Use   Vaping status: Never Used  Substance and Sexual Activity   Alcohol use: Yes    Alcohol/week: 28.0 standard drinks of alcohol    Types: 7 Cans of beer, 21 Shots of liquor per week    Comment: pint of liquor per day   Drug use: No    Types: Marijuana    Comment: quit back in the late 90's   Sexual activity: Yes  Other Topics Concern   Not on file  Social History Narrative   Lives with his Mother [name]    Social Drivers of Health   Financial Resource Strain: Low Risk  (07/01/2019)   Overall Financial Resource Strain (CARDIA)    Difficulty of Paying Living Expenses: Not hard at all  Food Insecurity: No Food Insecurity (04/26/2023)   Hunger Vital Sign    Worried About Running Out of Food in the Last Year: Never true    Ran Out of Food in the  Last Year: Never true  Transportation Needs: No Transportation Needs (04/26/2023)   PRAPARE - Administrator, Civil Service (Medical): No    Lack of Transportation (Non-Medical): No  Physical Activity: Sufficiently Active (07/01/2019)   Exercise Vital Sign    Days of Exercise per Week: 3 days    Minutes of Exercise per Session: 50 min  Stress: No Stress Concern Present (09/02/2019)   [employer] of Occupational Health - Occupational Stress Questionnaire  Feeling of Stress : Not at all  Social Connections: Moderately Isolated (07/01/2019)   Social Connection and Isolation Panel [NHANES]    Frequency of Communication with Friends and Family: Three times a week    Frequency of Social Gatherings with Friends and Family: Three times a week    Attends Religious Services: 1 to 4 times per year    Active Member of Clubs or Organizations: No    Attends Banker Meetings: Never    Marital Status: Widowed  Intimate Partner Violence: Not At Risk (04/26/2023)   Humiliation, Afraid, Rape, and Kick questionnaire    Fear of Current or Ex-Partner: No    Emotionally Abused: No    Physically Abused: No    Sexually Abused: No      Family History  Problem Relation Age of Onset   Hypertension Mother    Diabetes Mother    Colon cancer Mother    Hypertension Father    Multiple sclerosis Father     PHYSICAL EXAM: Vitals:   01/09/24 1318  BP: 137/88  Pulse: 75  SpO2: 97%  General: NAD Neck: No JVD, no thyromegaly or thyroid nodule.  Lungs: Distant BS CV: Nondisplaced PMI.  Heart regular S1/S2, no S3/S4, no murmur.  No peripheral edema.  No carotid bruit.  Normal pedal pulses.  Abdomen: Soft, nontender, no hepatosplenomegaly, no distention.  Skin: Intact without lesions or rashes.  Neurologic: Alert and oriented x 3.  Psych: Normal affect. Extremities: No clubbing or cyanosis.  HEENT: Normal.   ECHO: 11/11/22: LVEF 50-55%, normal RV function 11/05/21: LVEF  60-65%, normal RV function TEE (3/24): EF 55%, RV normal  Cardiolite (5/24): EF 45% but visually looks normal, no ischemia/infarction   ASSESSMENT & PLAN:  1.  Chronic diastolic CHF: Echo in 1/24 with EF 50-55%, normal RV in setting of atrial fibrillation with RVR.  TEE in 3/24 with EF 55% and normal RV.  Cardiolite in 5/24 with no ischemia/infarction.  NYHA class II-III symptoms.  He remains in NSR and is does not look volume overloaded on exam.  REDS clip upper normal at 35%.  I suspect dyspnea is multifactorial with severe COPD playing a role.   - Continue Lasix 40 mg daily, would not increase.  BMET/BNP today.  - Continue Jardiance 10 mg daily.  - Continue spironolactone 12.5 daily.  - I am going to arrange for repeat echo.  2. Atrial fibrillation: Paroxysmal.  He was not started on anticoagulation during 1/24 admission due to ETOH abuse, thrombocytopenia, and concerns about fall risk and ability to take anticoagulation consistently. However, anticoagulation was later started and he was cardioverted in 3/24.  He has not had any falls.  He has had GI bleeding (episode in 6/24).  No overt bleeding recently.  Unfortunately, he is on Tegretol so cannot take Eliquis, Xarelto, or Pradaxa.  GFR is too high for edoxaban.  He is, therefore, on warfarin. NSR today.  Still drinking but has cut back some.  - Continue warfarin with INR goal 1.8-2.3.   - Continue metoprolol 25 mg BID - With daytime sleepiness and snoring, I will arrange for sleep study.  - If he is able to stay off ETOH, will refer to EP for AF ablation. Watchman is also a consideration.     3. Alcohol abuse: Long history of alcohol abuse. He has cut back but still drinks 1/2 pint liquor daily.  - Discussed imperative to continue cutting back ETOH.  4. Thrombocytopenia: ?Related to ETOH abuse.  Recent platelets have been higher.  5. GI bleeding: Episode of suspected upper GI bleeding in 6/24 with melena.  EGD showed AVM that was  non-bleeding, he had APC.  Colonoscopy showed diverticulosis.  Bleeding attributed to ETOH abuse.  Warfarin was stopped in hospital but he restarted after discharge.  He denies melena/BRBPR currently.  - Continue warfarin for now but using lower INR goal for the time being, 1.8-2.3.  6. COPD: Severe obstruction on PFTs in 5/24, still smoking cigars. Suspect this contributes to his dyspnea.  - I urged him to quit.  - Follows with pulmonology.  7. Hyperlipidemia: Continue Crestor, check lipids today.   Followup in 3 months.   I spent 31 minutes reviewing records, interviewing/examining patient, and managing orders.   Eaden Hettinger 01/09/2024

## 2024-01-09 NOTE — Patient Instructions (Signed)
 There has been no changes to your medications.  Go DOWN to LOWER LEVEL (LL) to have your blood work completed inside of [employer] office.  We will only call you if the results are abnormal or if the provider would like to make medication changes.  Your physician has requested that you have an echocardiogram. Echocardiography is a painless test that uses sound waves to create images of your heart. It provides your doctor with information about the size and shape of your heart and how well your heart's chambers and valves are working. This procedure takes approximately one hour. There are no restrictions for this procedure. Please do NOT wear cologne, perfume, aftershave, or lotions (deodorant is allowed). Please arrive 15 minutes prior to your appointment time.  Please note: We ask at that you not bring children with you during ultrasound (echo/ vascular) testing. Due to room size and safety concerns, children are not allowed in the ultrasound rooms during exams. Our front office staff cannot provide observation of children in our lobby area while testing is being conducted. An adult accompanying a patient to their appointment will only be allowed in the ultrasound room at the discretion of the ultrasound technician under special circumstances. We apologize for any inconvenience.  Your provider has ordered an sleep study test for you. You will be called to have this test arranged.  Your physician recommends that you schedule a follow-up appointment in: 3 months (June) ** PLEASE CALL THE OFFICE IN APRIL TO ARRANGE YOUR FOLLOW UP APPOINTMENT.**  At the [facility], you and your health needs are our priority. As part of our continuing mission to provide you with exceptional heart care, we have created designated Provider Care Teams. These Care Teams include your primary Cardiologist (physician) and Advanced Practice Providers (APPs- Physician Assistants and Nurse Practitioners) who all  work together to provide you with the care you need, when you need it.   You may see any of the following providers on your designated Care Team at your next follow up: Dr Arvilla Meres Dr Marca Ancona Dr. Dorthula Nettles Dr. Clearnce Hasten Amy Filbert Schilder, NP Robbie Lis, [place] [facility] [place], [place] Brynda Peon, NP [place] Lee, NP Clarisa Kindred, NP Karle Plumber, PharmD Enos Fling, PharmD   Please be sure to bring in all your medications bottles to every appointment.    Thank you for choosing [facility]

## 2024-01-09 NOTE — Progress Notes (Signed)
 ReDS Vest / Clip - 01/09/24 1300       ReDS Vest / Clip   Station Marker D    Ruler Value 35    ReDS Value Range Low volume    ReDS Actual Value 35

## 2024-01-11 LAB — LIPID PANEL
Chol/HDL Ratio: 3.8 ratio (ref 0.0–5.0)
Cholesterol, Total: 218 mg/dL — ABNORMAL HIGH (ref 100–199)
HDL: 58 mg/dL (ref >39)
LDL Chol Calc (NIH): 138 mg/dL — ABNORMAL HIGH (ref 0–99)
Triglycerides: 125 mg/dL (ref 0–149)
VLDL Cholesterol Cal: 22 mg/dL (ref 5–40)

## 2024-01-11 LAB — BASIC METABOLIC PANEL
BUN/Creatinine Ratio: 28 — ABNORMAL HIGH (ref 9–20)
BUN: 21 mg/dL (ref 6–24)
CO2: 25 mmol/L (ref 20–29)
Calcium: 9.3 mg/dL (ref 8.7–10.2)
Chloride: 97 mmol/L (ref 96–106)
Creatinine, Ser: 0.76 mg/dL (ref 0.76–1.27)
Glucose: 84 mg/dL (ref 70–99)
Potassium: 4.7 mmol/L (ref 3.5–5.2)
Sodium: 138 mmol/L (ref 134–144)
eGFR: 107 mL/min/{1.73_m2} (ref >59)

## 2024-01-11 LAB — BRAIN NATRIURETIC PEPTIDE: BNP: 13.1 pg/mL (ref 0.0–100.0)

## 2024-01-12 ENCOUNTER — Telehealth: Payer: Self-pay

## 2024-01-12 NOTE — Telephone Encounter (Signed)
 Fax info for sleep study to [phone]. Faxed order, demographics, and ov notes. Faxed confirmed success.

## 2024-01-17 ENCOUNTER — Other Ambulatory Visit: Payer: Self-pay

## 2024-01-17 ENCOUNTER — Ambulatory Visit: Payer: Medicaid Other | Attending: Internal Medicine

## 2024-01-17 DIAGNOSIS — I4819 Other persistent atrial fibrillation: Secondary | ICD-10-CM | POA: Diagnosis not present

## 2024-01-17 DIAGNOSIS — Z5181 Encounter for therapeutic drug level monitoring: Secondary | ICD-10-CM

## 2024-01-17 LAB — POCT INR: INR: 1.8 — AB (ref 2.0–3.0)

## 2024-01-17 MED ORDER — ROSUVASTATIN CALCIUM 40 MG PO TABS: 40.0000 mg | ORAL_TABLET | Freq: Every day | ORAL | 3 refills | Status: AC

## 2024-01-17 NOTE — Patient Instructions (Signed)
 Continue 2 TABLETS DAILY, EXCEPT 1 TABLET ON SUNDAY, WEDNESDAY AND SATURDAY.  INR in 4 weeks. [phone]

## 2024-01-24 ENCOUNTER — Ambulatory Visit (INDEPENDENT_AMBULATORY_CARE_PROVIDER_SITE_OTHER): Payer: Medicaid Other | Admitting: Nurse Practitioner

## 2024-01-24 ENCOUNTER — Encounter: Payer: Self-pay | Admitting: Nurse Practitioner

## 2024-01-24 VITALS — BP 105/64 | HR 99 | Ht 70.0 in | Wt 228.2 lb

## 2024-01-24 DIAGNOSIS — G40909 Epilepsy, unspecified, not intractable, without status epilepticus: Secondary | ICD-10-CM

## 2024-01-24 DIAGNOSIS — E7800 Pure hypercholesterolemia, unspecified: Secondary | ICD-10-CM

## 2024-01-24 DIAGNOSIS — I10 Essential (primary) hypertension: Secondary | ICD-10-CM

## 2024-01-24 DIAGNOSIS — R7303 Prediabetes: Secondary | ICD-10-CM | POA: Diagnosis not present

## 2024-01-24 DIAGNOSIS — I5032 Chronic diastolic (congestive) heart failure: Secondary | ICD-10-CM | POA: Diagnosis not present

## 2024-01-24 DIAGNOSIS — J441 Chronic obstructive pulmonary disease with (acute) exacerbation: Secondary | ICD-10-CM | POA: Diagnosis not present

## 2024-01-24 DIAGNOSIS — R569 Unspecified convulsions: Secondary | ICD-10-CM | POA: Diagnosis not present

## 2024-01-24 DIAGNOSIS — F1010 Alcohol abuse, uncomplicated: Secondary | ICD-10-CM

## 2024-01-24 LAB — BAYER DCA HB A1C WAIVED: HB A1C (BAYER DCA - WAIVED): 6 % — ABNORMAL HIGH (ref 4.8–5.6)

## 2024-01-24 NOTE — Assessment & Plan Note (Signed)
 Chronic, ongoing  Patient is followed by Pulmonology  Currently on Breztri. He is using Albuterol inhaler about 2 times per day - unsure of neb use  Continue current regimen for now, will collaborate with Pulm for management  Encouraged smoking cessation. Follow up in 6 months or sooner if concerns arise

## 2024-01-24 NOTE — Assessment & Plan Note (Signed)
 Chronic, ongoing Continues to drink about 1/2 pint every two days Reviewed that alcohol cessation can be dangerous and can lead to severe health issues if not supervised  Recommend he continues very gradual reduction and has close follow up with PCP and neurology to help prevent withdrawal complications Follow up in 3 months or sooner if concerns arise

## 2024-01-24 NOTE — Progress Notes (Signed)
 BP 105/64 (BP Location: Left Arm, Patient Position: Sitting, Cuff Size: Large)   Pulse 99   Ht 5\' 10"  (1.778 m)   Wt 228 lb 3.2 oz (103.5 kg)   SpO2 99%   BMI 32.74 kg/m    Subjective:    Patient ID: Jose Schuller., male    DOB: 10/02/1971, 53 y.o.   MRN: [id number]  HPI: Jose Nephew. is a 53 y.o. male  Chief Complaint  Patient presents with   Hypertension   Medication Refill    Iron, folic acid   HYPERTENSION / HYPERLIPIDEMIA/HF Patient has seen Cardiology recently.  Started on Warfarin for anticoagulation.  Has been attending INR check appts.   He continues to have ongoing exertional SOB.  PFTs completed and showed severe COPD.  He does have NYHA class III symptoms. He appears euvolemic at visit today.  Does not weigh regularly at home. Satisfied with current treatment? yes Duration of hypertension: years BP monitoring frequency: not checking BP range:  BP medication side effects: no Past BP meds:  Lasix, amlodipine, carvedilol, and lisinopril Duration of hyperlipidemia: years Cholesterol medication side effects: no Cholesterol supplements: none Past cholesterol medications: rosuvastatin (crestor) Medication compliance: excellent compliance Aspirin: no Recent stressors: no Recurrent headaches: no Visual changes: no Palpitations: no Dyspnea: yes- doing okay as long has he doesn't over do it.   Chest pain: no Lower extremity edema: no Dizzy/lightheaded: no  ALCOHOL USE Drinking about a half a pint a day.  Sometimes he doesn't even drink a half a pint per day.    COPD Now followed by Pulmonology.  Only has SOB when he exerts himself.  On Advair and Spiriva COPD status: controlled Satisfied with current treatment?: no Oxygen use: no Dyspnea frequency: with exertion Cough frequency: no Rescue inhaler frequency:   Limitation of activity: yes Productive cough:  Last Spirometry:  Pneumovax: Up to Date Influenza: Up to Date Still smokes 2 cigars daily.         Relevant past medical, surgical, family and social history reviewed and updated as indicated. Interim medical history since our last visit reviewed. Allergies and medications reviewed and updated.  Review of Systems  Eyes:  Negative for visual disturbance.  Respiratory:  Positive for shortness of breath. Negative for chest tightness.   Cardiovascular:  Negative for chest pain, palpitations and leg swelling.  Neurological:  Negative for dizziness, light-headedness and headaches.    Per HPI unless specifically indicated above     Objective:    BP 105/64 (BP Location: Left Arm, Patient Position: Sitting, Cuff Size: Large)   Pulse 99   Ht 5\' 10"  (1.778 m)   Wt 228 lb 3.2 oz (103.5 kg)   SpO2 99%   BMI 32.74 kg/m   Wt Readings from Last 3 Encounters:  01/24/24 228 lb 3.2 oz (103.5 kg)  01/09/24 227 lb (103 kg)  12/05/23 224 lb 9.6 oz (101.9 kg)    Physical Exam Vitals and nursing note reviewed.  Constitutional:      General: He is not in acute distress.    Appearance: Normal appearance. He is not ill-appearing, toxic-appearing or diaphoretic.  HENT:     Head: Normocephalic.     Right Ear: External ear normal.     Left Ear: External ear normal.     Nose: Nose normal. No congestion or rhinorrhea.     Mouth/Throat:     Mouth: Mucous membranes are moist.  Eyes:     General:  Right eye: No discharge.        Left eye: No discharge.     Extraocular Movements: Extraocular movements intact.     Conjunctiva/sclera: Conjunctivae normal.     Pupils: Pupils are equal, round, and reactive to light.  Cardiovascular:     Rate and Rhythm: Normal rate. Rhythm irregular.     Heart sounds: No murmur heard. Pulmonary:     Effort: Pulmonary effort is normal. No respiratory distress.     Breath sounds: Wheezing present. No rhonchi or rales.  Abdominal:     General: Abdomen is flat. Bowel sounds are normal.  Musculoskeletal:     Cervical back: Normal range of motion and  neck supple.  Skin:    General: Skin is warm and dry.     Capillary Refill: Capillary refill takes less than 2 seconds.  Neurological:     General: No focal deficit present.     Mental Status: He is alert and oriented to person, place, and time.  Psychiatric:        Mood and Affect: Mood normal.        Behavior: Behavior normal.        Thought Content: Thought content normal.        Judgment: Judgment normal.     Results for orders placed or performed in visit on 01/17/24  POCT INR   Collection Time: 01/17/24  3:25 PM  Result Value Ref Range   INR 1.8 (A) 2.0 - 3.0   POC INR        Assessment & Plan:   Problem List Items Addressed This Visit       Cardiovascular and Mediastinum   Hypertension   Chronic, historic condition Appears managed with Losartan 12.5 PO at bedtime, lasix 40 mg PO every day, spironolactone 12.5 mg pO every day, Metoprolol 25 mg PO BID Continue to follow with Cardiology - medications are managed by specialty  Follow up in 6 months or sooner if concerns arise       Chronic diastolic CHF (congestive heart failure) (HCC)   Chronic, historic condition Appears managed with Jardiance 10 mg PO every day, Lasix 40 mg PO every day  Cardiology appears to be monitoring and managing medications Appears Euvolemic in office today.  Continue current regimen and Cardiology follow ups Follow up in 3 months or sooner if concerns arise   - Reminded to call for an overnight weight gain of >2 pounds or a weekly weight gain of >5 pounds - not adding salt to food and read food labels. Reviewed the importance of keeping daily sodium intake to 2000mg  daily. - Avoid Ibuprofen products.        Respiratory   COPD (chronic obstructive pulmonary disease) (HCC)   Chronic, ongoing  Patient is followed by Pulmonology  Currently on Breztri. He is using Albuterol inhaler about 2 times per day - unsure of neb use  Continue current regimen for now, will collaborate with Pulm  for management  Encouraged smoking cessation. Follow up in 6 months or sooner if concerns arise         Nervous and Auditory   Epilepsy (HCC)   Chronic.  Followed by Neurology.  On tegretol which is managed by Neurology.  Recommend gradual cessation of alcohol. Last seizure was January 2024 while acutely withdrawing from alcohol.        Other   Seizures (HCC) - Primary   Chronic.  Secondary to alcoholism.  Counseled patient on alcohol cessation.  Discussed community resources available.  Continue with Tegretol.  Levels checked at visit today.  Continue to follow up with Neurology. He was told her is not able to drive for 6 months.        Alcohol abuse   Chronic, ongoing Continues to drink about 1/2 pint every two days Reviewed that alcohol cessation can be dangerous and can lead to severe health issues if not supervised  Recommend he continues very gradual reduction and has close follow up with PCP and neurology to help prevent withdrawal complications Follow up in 3 months or sooner if concerns arise       Hypercholesterolemia   Chronic, ongoing Appears controlled with current regimen comprised of rosuvastatin 20 mg PO every day  Continue current regimen  Recheck lipid panel reviewed from Cardiology visit.  Follow up in 6 months or sooner if concerns arise       Prediabetes   Labs ordered at visit today.  Will make recommendations based on lab results.        Relevant Orders   Bayer DCA Hb A1c Waived       Follow up plan: Return in about 6 months (around 07/26/2024) for HTN, HLD, DM2 FU.

## 2024-01-24 NOTE — Assessment & Plan Note (Signed)
 Chronic.  Followed by Neurology.  On tegretol which is managed by Neurology.  Recommend gradual cessation of alcohol. Last seizure was January 2024 while acutely withdrawing from alcohol.

## 2024-01-24 NOTE — Assessment & Plan Note (Signed)
 Chronic, historic condition Appears managed with Jardiance 10 mg PO every day, Lasix 40 mg PO every day  Cardiology appears to be monitoring and managing medications Appears Euvolemic in office today.  Continue current regimen and Cardiology follow ups Follow up in 3 months or sooner if concerns arise   - Reminded to call for an overnight weight gain of >2 pounds or a weekly weight gain of >5 pounds - not adding salt to food and read food labels. Reviewed the importance of keeping daily sodium intake to 2000mg  daily. - Avoid Ibuprofen products.

## 2024-01-24 NOTE — Assessment & Plan Note (Signed)
 Chronic, historic condition Appears managed with Losartan 12.5 PO at bedtime, lasix 40 mg PO every day, spironolactone 12.5 mg pO every day, Metoprolol 25 mg PO BID Continue to follow with Cardiology - medications are managed by specialty  Follow up in 6 months or sooner if concerns arise

## 2024-01-24 NOTE — Assessment & Plan Note (Signed)
 Chronic, ongoing Appears controlled with current regimen comprised of rosuvastatin 20 mg PO every day  Continue current regimen  Recheck lipid panel reviewed from Cardiology visit.  Follow up in 6 months or sooner if concerns arise

## 2024-01-24 NOTE — Assessment & Plan Note (Signed)
 Labs ordered at visit today.  Will make recommendations based on lab results.

## 2024-01-24 NOTE — Assessment & Plan Note (Signed)
Chronic.  Secondary to alcoholism.  Counseled patient on alcohol cessation.  Discussed community resources available.  Continue with Tegretol.  Levels checked at visit today.  Continue to follow up with Neurology. He was told her is not able to drive for 6 months.

## 2024-01-25 ENCOUNTER — Encounter: Payer: Self-pay | Admitting: Nurse Practitioner

## 2024-01-30 ENCOUNTER — Other Ambulatory Visit: Payer: Self-pay | Admitting: Nurse Practitioner

## 2024-01-31 NOTE — Telephone Encounter (Signed)
 Requested Prescriptions  Pending Prescriptions Disp Refills   folic acid (FOLVITE) 1 MG tablet [Pharmacy Med Name: Folic Acid 1 MG Oral Tablet] 90 tablet 0    Sig: Take 1 tablet by mouth once daily     Endocrinology:  Vitamins Passed - 01/31/2024  3:11 PM      Passed - Valid encounter within last 12 months    Recent Outpatient Visits           1 week ago Seizures [facility])   [place] [facility] [name], NP

## 2024-02-07 ENCOUNTER — Ambulatory Visit

## 2024-02-17 DIAGNOSIS — Z419 Encounter for procedure for purposes other than remedying health state, unspecified: Secondary | ICD-10-CM | POA: Diagnosis not present

## 2024-02-21 ENCOUNTER — Ambulatory Visit: Attending: Internal Medicine

## 2024-02-21 DIAGNOSIS — I4819 Other persistent atrial fibrillation: Secondary | ICD-10-CM

## 2024-02-21 DIAGNOSIS — Z5181 Encounter for therapeutic drug level monitoring: Secondary | ICD-10-CM | POA: Diagnosis not present

## 2024-02-21 LAB — POCT INR: INR: 4.4 — AB (ref 2.0–3.0)

## 2024-02-21 MED ORDER — WARFARIN SODIUM 5 MG PO TABS: ORAL_TABLET | ORAL | 1 refills | Status: DC

## 2024-02-21 NOTE — Patient Instructions (Signed)
 HOLD TODAY ONLY and TAKE ONLY 1 TABLET TOMORROW then Continue 2 TABLETS DAILY, EXCEPT 1 TABLET ON SUNDAY, WEDNESDAY AND SATURDAY.  INR in 3 weeks. [phone]

## 2024-02-25 ENCOUNTER — Other Ambulatory Visit: Payer: Self-pay | Admitting: Cardiology

## 2024-02-25 DIAGNOSIS — I4819 Other persistent atrial fibrillation: Secondary | ICD-10-CM

## 2024-03-04 ENCOUNTER — Telehealth: Payer: Self-pay

## 2024-03-04 ENCOUNTER — Telehealth: Payer: Self-pay | Admitting: Cardiology

## 2024-03-12 ENCOUNTER — Ambulatory Visit
Admission: RE | Admit: 2024-03-12 | Discharge: 2024-03-12 | Disposition: A | Discharge status: Home / Self Care | Source: Ambulatory Visit | Attending: Cardiology | Admitting: Cardiology

## 2024-03-12 DIAGNOSIS — I517 Cardiomegaly: Secondary | ICD-10-CM | POA: Insufficient documentation

## 2024-03-12 DIAGNOSIS — I340 Nonrheumatic mitral (valve) insufficiency: Secondary | ICD-10-CM | POA: Diagnosis not present

## 2024-03-12 DIAGNOSIS — I5032 Chronic diastolic (congestive) heart failure: Secondary | ICD-10-CM | POA: Diagnosis not present

## 2024-03-12 LAB — ECHOCARDIOGRAM COMPLETE
AR max vel: 3.32 cm2
AV Area VTI: 3.96 cm2
AV Area mean vel: 3.54 cm2
AV Mean grad: 1 mmHg
AV Peak grad: 2.4 mmHg
Ao pk vel: 0.78 m/s
Area-P 1/2: 7.44 cm2
S' Lateral: 3.6 cm

## 2024-03-12 NOTE — Progress Notes (Signed)
*  PRELIMINARY RESULTS* Echocardiogram 2D Echocardiogram has been performed.  Jose [name] 03/12/2024, 10:58 AM

## 2024-03-12 NOTE — Progress Notes (Signed)
*  PRELIMINARY RESULTS* Echocardiogram 2D Echocardiogram has been performed.  Jose [name] 03/12/2024, 10:57 AM

## 2024-03-13 ENCOUNTER — Ambulatory Visit: Attending: Internal Medicine

## 2024-03-13 DIAGNOSIS — Z5181 Encounter for therapeutic drug level monitoring: Secondary | ICD-10-CM | POA: Diagnosis not present

## 2024-03-13 DIAGNOSIS — I4819 Other persistent atrial fibrillation: Secondary | ICD-10-CM | POA: Diagnosis not present

## 2024-03-13 LAB — POCT INR: INR: 2.5 (ref 2.0–3.0)

## 2024-03-13 NOTE — Patient Instructions (Signed)
 Continue 2 TABLETS DAILY, EXCEPT 1 TABLET ON SUNDAY, WEDNESDAY AND SATURDAY.  INR in 4 weeks. [phone]

## 2024-03-14 ENCOUNTER — Encounter (HOSPITAL_COMMUNITY): Payer: Self-pay

## 2024-03-18 DIAGNOSIS — Z419 Encounter for procedure for purposes other than remedying health state, unspecified: Secondary | ICD-10-CM | POA: Diagnosis not present

## 2024-03-19 ENCOUNTER — Ambulatory Visit (INDEPENDENT_AMBULATORY_CARE_PROVIDER_SITE_OTHER): Payer: Medicaid Other | Admitting: Pulmonary Disease

## 2024-03-19 ENCOUNTER — Ambulatory Visit (HOSPITAL_COMMUNITY): Payer: Self-pay

## 2024-03-19 ENCOUNTER — Encounter: Payer: Self-pay | Admitting: Pulmonary Disease

## 2024-03-19 VITALS — BP 148/90 | HR 79 | Temp 96.9°F | Ht 70.0 in | Wt 228.6 lb

## 2024-03-19 DIAGNOSIS — J449 Chronic obstructive pulmonary disease, unspecified: Secondary | ICD-10-CM | POA: Diagnosis not present

## 2024-03-19 DIAGNOSIS — I4891 Unspecified atrial fibrillation: Secondary | ICD-10-CM | POA: Diagnosis not present

## 2024-03-19 DIAGNOSIS — F1010 Alcohol abuse, uncomplicated: Secondary | ICD-10-CM

## 2024-03-19 DIAGNOSIS — R0609 Other forms of dyspnea: Secondary | ICD-10-CM

## 2024-03-19 DIAGNOSIS — F17210 Nicotine dependence, cigarettes, uncomplicated: Secondary | ICD-10-CM

## 2024-03-19 DIAGNOSIS — G4734 Idiopathic sleep related nonobstructive alveolar hypoventilation: Secondary | ICD-10-CM

## 2024-03-19 NOTE — Patient Instructions (Signed)
 VISIT SUMMARY:  Today, you came in for a follow-up visit to discuss your chronic shortness of breath. We reviewed your current treatment plan and discussed some lifestyle changes that could help improve your symptoms.  YOUR PLAN:  -SEVERE COPD: Chronic obstructive pulmonary disease (COPD) is a lung condition that makes it hard to breathe. Your current inhalers (Advair, Spiriva , and albuterol ) are working well, and using oxygen  at night is helpful. We will check your oxygen  levels while you walk, schedule pulmonary function tests, and consider pulmonary rehabilitation. It's important to stop smoking cigars to help your lungs.  -ATRIAL FIBRILLATION: Atrial fibrillation is an irregular heart rhythm that can make you feel more short of breath when you exercise. Reducing or stopping alcohol  can help improve your heart rhythm.  -ALCOHOL  USE DISORDER: Alcohol  use disorder means drinking alcohol  in a way that is harmful to your health. Drinking alcohol  affects your heart rhythm and overall health. We recommend gradually reducing your alcohol  intake to stop completely.  INSTRUCTIONS:  Please follow up with the recommended pulmonary function tests and check your oxygen  levels during walking. We will consider pulmonary rehabilitation if your atrial fibrillation allows. It's important to stop smoking cigars and gradually reduce your alcohol  intake to improve your overall health.

## 2024-03-19 NOTE — Progress Notes (Signed)
 Subjective:    Patient ID: Barbaraann Bookbinder., male    DOB: 1971-09-07, 53 y.o.   MRN: [id number]  Patient Care Team: Aileen Alexanders, NP as PCP - General Elie Grove, LCSW as Social Worker (Licensed Clinical Social Worker) Marc Senior, MD as Consulting Physician (Pulmonary Disease) Roetta Clarke, NP as Nurse Practitioner (Nurse Practitioner)  Chief Complaint  Patient presents with   Follow-up    DOE. Cough with clear sputum. No wheezing.     BACKGROUND/INTERVAL:Patient is a 53 year old current cigar smoker with 33-pack-year history of cigarette smoking who presents for follow-up on the issue of shortness of breath.  He has stage III COPD.  Currently maintained on Advair discus and Spiriva  uses albuterol  as well.  Last visit here was on 05 December 2023 with me.  No hospitalizations since his last visit. No exacerbations since then.   HPI Discussed the use of AI scribe software for clinical note transcription with the patient, who gave verbal consent to proceed.  History of Present Illness   Maddix Heinz. is a 53 year old male with severe COPD who presents for follow-up of chronic dyspnea.  He presents with his mother, Bernardo Bridgeman.  He experiences chronic dyspnea that varies with physical activity, such as walking. He uses oxygen  at night, which he finds helpful. His current inhaler regimen includes Advair, Spiriva , and albuterol , which he states are effective.  He smokes two cigars daily, which he describes as 'big ones'. He consumes about half a pint of alcohol  daily.  He has a history of atrial fibrillation, which affects his exercise tolerance, causing irregular heart rate during exertion.   He was counseled extensively with regards to discontinuation of cigar smoking and alcohol  consumption.     DATA 04/27/2022 PFTs: FEV1 1.43 L or 43% predicted, FVC 2.39 L or 57% predicted, FEV1/FVC 60%, no bronchodilator response.  There is significant air trapping.  Mild  restrictive physiology on the basis of obesity (ERV 37%) diffusion capacity moderately reduced.  Consistent with severe obstructive defect with concomitant mild restrictive defect on the basis of obesity. 03/14/2023 PFTs: FEV1 1.10 L or 28% predicted, FVC 2.34 L or 46% predicted, FEV1/FVC 47%, no bronchodilator response.  Patient unable to perform DLCO due to poor understanding of instructions. 08/16/2023 LDCT chest: Centrilobular and paraseptal emphysema.  Right lung nodule identified.  Patient's pulmonary nodules of mass.  Enlargement of pleural tract/main pulmonary arteries suggestive of pulmonary arterial hypertension.  RADS 2.  Benign appearance and behavior.    Review of Systems A 10 point review of systems was performed and it is as noted above otherwise negative.   Patient Active Problem List   Diagnosis Date Noted   Nocturnal hypoxemia 06/30/2023   Upper GI bleed 04/26/2023   Blood loss anemia 04/26/2023   AKI (acute kidney injury) (HCC) 04/26/2023   Hyperkalemia 04/26/2023   Alcohol  use disorder 04/26/2023   Persistent atrial fibrillation (HCC) 12/19/2022   Seizure disorder (HCC) 11/11/2022   Acute metabolic encephalopathy 11/11/2022   Thrombocytopenia (HCC) 11/11/2022   Elevated LFTs 11/11/2022   Atrial fibrillation with RVR (HCC) 11/11/2022   Right knee pain 11/11/2022   Hypomagnesemia 11/11/2022   Prediabetes 08/24/2022   Acute CHF (congestive heart failure) (HCC) 12/21/2021   Alcoholic intoxication without complication (HCC) 02/22/2021   Chronic diastolic CHF (congestive heart failure) (HCC) 09/08/2020   Iron  deficiency anemia due to chronic blood loss    Family history of colon cancer 06/28/2016   Seizures (HCC)  04/30/2015   Hypertension 04/30/2015   Alcohol  abuse 04/30/2015   Epilepsy (HCC) 04/30/2015   Tobacco dependence 04/30/2015   COPD (chronic obstructive pulmonary disease) (HCC) 04/30/2015   Lumbago 04/30/2015   Hypercholesterolemia 04/30/2015   Chronic  pain 04/30/2015   Neck pain 04/22/2013    Social History   Tobacco Use   Smoking status: Every Day    Average packs/day: 1.5 packs/day for 30.0 years (45.0 ttl pk-yrs)    Types: Cigarettes, Cigars    Start date: 59    Last attempt to quit: 2021    Years since quitting: 4.4   Smokeless tobacco: Former   Tobacco comments:    2 cigars a day- 03/19/2024 khj    Quit smoking cigarettes 3 years ago.  Smokes cigarettes since he was 53 years old.  Substance Use Topics   Alcohol  use: Yes    Alcohol /week: 28.0 standard drinks of alcohol     Types: 7 Cans of beer, 21 Shots of liquor per week    Comment: pint of liquor per day    No Known Allergies  Current Meds  Medication Sig   albuterol  (VENTOLIN  HFA) 108 (90 Base) MCG/ACT inhaler Inhale 2 puffs into the lungs every 6 (six) hours as needed for wheezing or shortness of breath.   carbamazepine  (TEGRETOL ) 200 MG tablet Take 1 tablet (200 mg total) by mouth 2 (two) times daily.   fluticasone -salmeterol (ADVAIR DISKUS) 100-50 MCG/ACT AEPB Inhale 1 puff into the lungs 2 (two) times daily.   folic acid  (FOLVITE ) 1 MG tablet Take 1 tablet by mouth once daily   furosemide  (LASIX ) 40 MG tablet TAKE 1 TABLET BY MOUTH ONCE DAILY IN THE MORNING   Magnesium  Oxide -Mg Supplement (MAG-OXIDE) 200 MG TABS Take 1 tablet (200 mg total) by mouth 2 (two) times daily.   potassium chloride  (KLOR-CON ) 10 MEQ tablet Take 20 mEq by mouth daily.   rosuvastatin  (CRESTOR ) 40 MG tablet Take 1 tablet (40 mg total) by mouth daily.   Tiotropium Bromide Monohydrate  (SPIRIVA  RESPIMAT) 2.5 MCG/ACT AERS Inhale 2 puffs into the lungs daily.   warfarin (COUMADIN ) 5 MG tablet Take 1-2 tablets daily as instructed by the warfarin clinic   [DISCONTINUED] empagliflozin  (JARDIANCE ) 10 MG TABS tablet Take 1 tablet (10 mg total) by mouth daily before breakfast.   [DISCONTINUED] ferrous sulfate  (SV IRON ) 325 (65 FE) MG tablet Take 1 tablet by mouth once daily   [DISCONTINUED] losartan   (COZAAR ) 25 MG tablet Take 0.5 tablets (12.5 mg total) by mouth at bedtime.   [DISCONTINUED] metoprolol  tartrate (LOPRESSOR ) 25 MG tablet Take 1 tablet (25 mg total) by mouth 2 (two) times daily.   [DISCONTINUED] potassium chloride  (MICRO-K ) 10 MEQ CR capsule Take 2 capsules by mouth once daily   [DISCONTINUED] spironolactone  (ALDACTONE ) 25 MG tablet Take 0.5 tablets (12.5 mg total) by mouth daily.    Immunization History  Administered Date(s) Administered   Influenza, Seasonal, Injecte, Preservative Fre 10/26/2023   Influenza,inj,Quad PF,6+ Mos 08/17/2022   Influenza-Unspecified 08/22/2019   PFIZER(Purple Top)SARS-COV-2 Vaccination 09/14/2020, 10/19/2020   PNEUMOCOCCAL CONJUGATE-20 08/26/2022        Objective:     BP (!) 148/90 (BP Location: Right Arm, Cuff Size: Large)   Pulse 79   Temp (!) 96.9 F (36.1 C)   Ht 5\' 10"  (1.778 m)   Wt 228 lb 9.6 oz (103.7 kg)   SpO2 95%   BMI 32.80 kg/m   SpO2: 95 % O2 Device: None (Room air)  GENERAL: Well-developed, overweight  gentleman, no acute distress, no gait disturbance, very nasal quality to speech.  No conversational dyspnea. HEAD: Normocephalic, atraumatic.  EYES: Pupils equal, round, reactive to light.  No scleral icterus.  MOUTH: Poor dentition, oral mucosa moist. NECK: Supple. No thyromegaly. Trachea midline. No JVD.  No adenopathy. PULMONARY: Good air entry bilaterally.  Coarse, otherwise no adventitious sounds. CARDIOVASCULAR: S1 and S2.  Irregular rate and rhythm (A-fib) controlled VR.  No rubs, murmurs or gallops heard. ABDOMEN: Benign. MUSCULOSKELETAL: No joint deformity, mild early clubbing, no edema.  NEUROLOGIC: No overt focal deficit, no gait disturbance, speech is fluent. SKIN: Intact,warm,dry. PSYCH: Mood and behavior normal.   Ambulatory oxymetry was performed today:  At rest on room air oxygen  saturation was 95%, the patient ambulated at a moderate pace, completed 3 laps, O2 nadir 92%, moderate to severe  shortness of breath.  Resting heart rate was 79 bpm at maximum for this exercise 120 bpm.  Heart rate irregular at times during the testing (patient known to have atrial fibrillation).    Assessment & Plan:     ICD-10-CM   1. Stage 3 severe COPD by GOLD classification (HCC)  J44.9 Pulmonary function test    2. Nocturnal hypoxemia  G47.34     3. Atrial fibrillation, unspecified type (HCC)  I48.91     4. Alcohol  abuse  F10.10     5. Exertional dyspnea  R06.09 Pulmonary function test      Orders Placed This Encounter  Procedures   Pulmonary function test    Standing Status:   Future    Expected Date:   06/19/2024    Expiration Date:   03/19/2025    Scheduling Instructions:     Do on or before F/U appt.    Where should this test be performed?:   Outpatient Pulmonary    What type of PFT is being ordered?:   Full PFT   Discussion:    Severe COPD Chronic severe COPD with ongoing dyspnea. Oxygen  therapy at night is beneficial. Inhalers (Advair, Spiriva , albuterol ) are effective. Pulmonary rehabilitation may be beneficial, but atrial fibrillation limits exercise tolerance. Smoking cessation is advised to improve respiratory function. - Check oxygen  levels during ambulation - Schedule pulmonary function tests - Evaluate need for pulmonary rehabilitation - Advise cessation of cigar smoking  Atrial Fibrillation Atrial fibrillation exacerbates dyspnea during exertion. Heart rate becomes irregular with increased physical activity. Alcohol  consumption negatively impacts cardiac rhythm, increasing the risk of arrhythmias. - Advise cessation of alcohol  consumption to improve cardiac rhythm  Alcohol  Use Disorder Alcohol  use disorder with consumption of half a pint of alcohol  daily. Alcohol  negatively impacts cardiac rhythm and seizure control. Gradual reduction of alcohol  intake is necessary to prevent withdrawal symptoms and improve overall health. - Advise gradual reduction of alcohol   intake to cessation      Advised if symptoms do not improve or worsen, to please contact office for sooner follow up or seek emergency care.    I spent 40 minutes of dedicated to the care of this patient on the date of this encounter to include pre-visit review of records, face-to-face time with the patient discussing conditions above, post visit ordering of testing, clinical documentation with the electronic health record, making appropriate referrals as documented, and communicating necessary findings to members of the patients care team.     C. Chloe Counter, MD Advanced [facility]ch[facility]    *This note was generated using voice recognition software/Dragon and/or AI transcription program.  Despite best efforts  to proofread, errors can occur which can change the meaning. Any transcriptional errors that result from this process are unintentional and may not be fully corrected at the time of dictation.

## 2024-03-21 ENCOUNTER — Other Ambulatory Visit: Payer: Self-pay | Admitting: Internal Medicine

## 2024-03-21 ENCOUNTER — Other Ambulatory Visit: Payer: Self-pay | Admitting: Cardiology

## 2024-03-21 DIAGNOSIS — I5032 Chronic diastolic (congestive) heart failure: Secondary | ICD-10-CM

## 2024-03-26 ENCOUNTER — Other Ambulatory Visit: Payer: Self-pay | Admitting: Nurse Practitioner

## 2024-03-28 NOTE — Telephone Encounter (Signed)
 Requested Prescriptions  Pending Prescriptions Disp Refills   SV IRON  325 (65 Fe) MG tablet [Pharmacy Med Name: SV Iron  325 MG Oral Tablet] 90 tablet 0    Sig: Take 1 tablet by mouth once daily     Endocrinology:  Minerals - Iron  Supplementation Passed - 03/28/2024 11:34 AM      Passed - HGB in normal range and within 360 days    Hemoglobin  Date Value Ref Range Status  08/23/2023 13.4 13.0 - 17.7 g/dL Final         Passed - HCT in normal range and within 360 days    Hematocrit  Date Value Ref Range Status  08/23/2023 40.4 37.5 - 51.0 % Final         Passed - RBC in normal range and within 360 days    RBC  Date Value Ref Range Status  08/23/2023 4.41 4.14 - 5.80 x10E6/uL Final  05/12/2023 2.66 (L) 4.22 - 5.81 MIL/uL Final         Passed - Fe (serum) in normal range and within 360 days    Iron   Date Value Ref Range Status  07/27/2023 46 38 - 169 ug/dL Final   Iron  Saturation  Date Value Ref Range Status  07/27/2023 16 15 - 55 % Final         Passed - Ferritin in normal range and within 360 days    Ferritin  Date Value Ref Range Status  07/27/2023 177 30 - 400 ng/mL Final         Passed - Valid encounter within last 12 months    Recent Outpatient Visits           2 months ago Seizures [facility])   [facility] Aileen Alexanders, NP

## 2024-04-03 ENCOUNTER — Telehealth: Payer: Self-pay | Admitting: Family

## 2024-04-03 NOTE — Telephone Encounter (Signed)
 Called to confirm/remind patient of their appointment at the [facility] on 04/04/24.   Appointment:   [x] Confirmed  [] Left mess   [] No answer/No voice mail  [] VM Full/unable to leave message  [] Phone not in service  Patient reminded to bring all medications and/or complete list.  Confirmed patient has transportation. Gave directions, instructed to utilize valet parking.

## 2024-04-04 ENCOUNTER — Other Ambulatory Visit
Admission: RE | Admit: 2024-04-04 | Discharge: 2024-04-04 | Disposition: A | Discharge status: Home / Self Care | Source: Ambulatory Visit | Attending: Cardiology | Admitting: Cardiology

## 2024-04-04 ENCOUNTER — Ambulatory Visit (HOSPITAL_BASED_OUTPATIENT_CLINIC_OR_DEPARTMENT_OTHER): Admitting: Cardiology

## 2024-04-04 ENCOUNTER — Encounter: Payer: Self-pay | Admitting: Cardiology

## 2024-04-04 VITALS — BP 140/98 | HR 95 | Ht 70.0 in | Wt 230.4 lb

## 2024-04-04 DIAGNOSIS — I493 Ventricular premature depolarization: Secondary | ICD-10-CM | POA: Insufficient documentation

## 2024-04-04 DIAGNOSIS — D6959 Other secondary thrombocytopenia: Secondary | ICD-10-CM | POA: Diagnosis not present

## 2024-04-04 DIAGNOSIS — I5089 Other heart failure: Secondary | ICD-10-CM | POA: Diagnosis not present

## 2024-04-04 DIAGNOSIS — Z8249 Family history of ischemic heart disease and other diseases of the circulatory system: Secondary | ICD-10-CM | POA: Diagnosis not present

## 2024-04-04 DIAGNOSIS — E785 Hyperlipidemia, unspecified: Secondary | ICD-10-CM | POA: Diagnosis not present

## 2024-04-04 DIAGNOSIS — I5032 Chronic diastolic (congestive) heart failure: Secondary | ICD-10-CM

## 2024-04-04 DIAGNOSIS — Z79899 Other long term (current) drug therapy: Secondary | ICD-10-CM | POA: Insufficient documentation

## 2024-04-04 DIAGNOSIS — J449 Chronic obstructive pulmonary disease, unspecified: Secondary | ICD-10-CM | POA: Diagnosis not present

## 2024-04-04 DIAGNOSIS — I4819 Other persistent atrial fibrillation: Secondary | ICD-10-CM | POA: Insufficient documentation

## 2024-04-04 DIAGNOSIS — I110 Hypertensive heart disease with heart failure: Secondary | ICD-10-CM | POA: Insufficient documentation

## 2024-04-04 DIAGNOSIS — R0683 Snoring: Secondary | ICD-10-CM | POA: Insufficient documentation

## 2024-04-04 DIAGNOSIS — F1010 Alcohol abuse, uncomplicated: Secondary | ICD-10-CM | POA: Diagnosis not present

## 2024-04-04 DIAGNOSIS — Z7901 Long term (current) use of anticoagulants: Secondary | ICD-10-CM | POA: Insufficient documentation

## 2024-04-04 DIAGNOSIS — F17290 Nicotine dependence, other tobacco product, uncomplicated: Secondary | ICD-10-CM | POA: Diagnosis not present

## 2024-04-04 LAB — CBC
HCT: 36.4 % — ABNORMAL LOW (ref 39.0–52.0)
Hemoglobin: 12 g/dL — ABNORMAL LOW (ref 13.0–17.0)
MCH: 31.6 pg (ref 26.0–34.0)
MCHC: 33 g/dL (ref 30.0–36.0)
MCV: 95.8 fL (ref 80.0–100.0)
Platelets: 113 10*3/uL — ABNORMAL LOW (ref 150–400)
RBC: 3.8 MIL/uL — ABNORMAL LOW (ref 4.22–5.81)
RDW: 14.6 % (ref 11.5–15.5)
WBC: 5.3 10*3/uL (ref 4.0–10.5)
nRBC: 0 % (ref 0.0–0.2)

## 2024-04-04 LAB — COMPREHENSIVE METABOLIC PANEL WITH GFR
ALT: 16 U/L (ref 0–44)
AST: 28 U/L (ref 15–41)
Albumin: 4 g/dL (ref 3.5–5.0)
Alkaline Phosphatase: 67 U/L (ref 38–126)
Anion gap: 11 (ref 5–15)
BUN: 14 mg/dL (ref 6–20)
CO2: 24 mmol/L (ref 22–32)
Calcium: 9.3 mg/dL (ref 8.9–10.3)
Chloride: 101 mmol/L (ref 98–111)
Creatinine, Ser: 0.71 mg/dL (ref 0.61–1.24)
GFR, Estimated: >60 mL/min (ref >60)
Glucose, Bld: 101 mg/dL — ABNORMAL HIGH (ref 70–99)
Potassium: 4 mmol/L (ref 3.5–5.1)
Sodium: 136 mmol/L (ref 135–145)
Total Bilirubin: 0.5 mg/dL (ref 0.0–1.2)
Total Protein: 7.6 g/dL (ref 6.5–8.1)

## 2024-04-04 LAB — LIPID PANEL
Cholesterol: 166 mg/dL (ref 0–200)
HDL: 54 mg/dL (ref >40)
LDL Cholesterol: 90 mg/dL (ref 0–99)
Total CHOL/HDL Ratio: 3.1 ratio
Triglycerides: 110 mg/dL (ref <150)
VLDL: 22 mg/dL (ref 0–40)

## 2024-04-04 LAB — BRAIN NATRIURETIC PEPTIDE: B Natriuretic Peptide: 38.1 pg/mL (ref 0.0–100.0)

## 2024-04-04 LAB — PROTIME-INR
INR: 2.7 — ABNORMAL HIGH (ref 0.8–1.2)
Prothrombin Time: 28.6 s — ABNORMAL HIGH (ref 11.4–15.2)

## 2024-04-04 MED ORDER — SPIRONOLACTONE 25 MG PO TABS: 25.0000 mg | ORAL_TABLET | Freq: Every day | ORAL | 3 refills | Status: DC

## 2024-04-04 MED ORDER — LOSARTAN POTASSIUM 25 MG PO TABS: 25.0000 mg | ORAL_TABLET | Freq: Every day | ORAL | 5 refills | Status: DC

## 2024-04-04 MED ORDER — EMPAGLIFLOZIN 10 MG PO TABS: 10.0000 mg | ORAL_TABLET | Freq: Every day | ORAL | 1 refills | Status: DC

## 2024-04-04 MED ORDER — METOPROLOL TARTRATE 25 MG PO TABS: 25.0000 mg | ORAL_TABLET | Freq: Two times a day (BID) | ORAL | 3 refills | Status: DC

## 2024-04-04 NOTE — Patient Instructions (Addendum)
 Medication Changes:  INCREASE LOSARTAN  TO 25MG  ONCE DAILY   Lab Work:  Labs done today, your results will be available in MyChart, we will contact you for abnormal readings.  Go over to the [facility]. Go pass the gift shop and have your blood work completed.  We will only call you if the results are abnormal or if the provider would like to make medication changes.  SLEEP STUDY REORDERED- THEY WILL BE IN CONTACT TO ARRANGE APPOINTMENT   Follow-Up in: 3 MONTHS WITH DR. Mitzie Anda PLEASE CALL OUR OFFICE AROUND JULY TO GET SCHEDULED FOR YOUR APPOINTMENT. PHONE NUMBER IS [phone] OPTION 2   At the [facility], you and your health needs are our priority. We have a designated team specialized in the treatment of Heart Failure. This Care Team includes your primary Heart Failure Specialized Cardiologist (physician), Advanced Practice Providers (APPs- Physician Assistants and Nurse Practitioners), and Pharmacist who all work together to provide you with the care you need, when you need it.   You may see any of the following providers on your designated Care Team at your next follow up:  Dr. Jules Oar Dr. Peder Bourdon Dr. Alwin Baars Dr. Judyth Nunnery Nieves Bars, NP Ruddy Corral, [place] [facility] [place], [place] Dennise Fitz, NP [place] Lee, NP Luster Salters, PharmD   Please be sure to bring in all your medications bottles to every appointment.   Need to Contact Us :  If you have any questions or concerns before your next appointment please send us  a message through [place] or call our office at [phone].    TO LEAVE A MESSAGE FOR THE NURSE SELECT OPTION 2, PLEASE LEAVE A MESSAGE INCLUDING: YOUR NAME DATE OF BIRTH CALL BACK NUMBER REASON FOR CALL**this is important as we prioritize the call backs  YOU WILL RECEIVE A CALL BACK THE SAME DAY AS LONG AS YOU CALL BEFORE 4:00 PM

## 2024-04-04 NOTE — Progress Notes (Signed)
 [facility] NOTE  Referring Physician: Aileen Alexanders, NP  Primary Care: Aileen Alexanders, NP HF Cardiology: Dr. Bruce Caper  HPI: Jose [name]. is a 53 y.o. male with hypertension, hyperlipidemia, COPD, significant alcohol  abuse, history of seizures, persistent atrial fibrillation.  Patient was admitted in 1/24 with fevers chills cough and fatigue.  He presented hypotensive to 60s/40s with new onset atrial fibrillation with RVR. TTE during admit with LVEF of 50-55%, moderate biatrial enlargement, normal RV. He was seen by neurology for possible alcohol  withdrawal related seizures and continued on home tegretol  (undetectable levels on admit). For his atrial fibrillation he was discharged home on cardizem  & lopressor . Anticoagulation was not started due to concern for continued alcohol  abuse and thrombocytopenia.   Patient was ultimately started on warfarin and had TEE-guided DCCV in 3/24.  TEE showed EF 55%, normal RV, mild LAE.    PFTs in 5/24 showed severe COPD. Cardiolite in 5/24 showed no ischemia or infarction.   Patient was admitted in 6/24 with melena and AKI, creatinine up to 2.29 and hgb down to 7.9.  He was transfused.  EGD showed a single AVM, nonbleeding, treated with APC. Colonoscopy showed diverticulosis.  Due to low BP, diltiazem  CD and Entresto  were stopped.  Warfarin was held while in the hospital but was restarted at discharge.   Echo in 5/25 showed EF 50-55%, normal RV, 4.5 cm aortic root.   He returns today for followup of CHF and atrial fibrillation.  Still smoking at least 2 cigars/day.  He is still drinking at least 1/2 pint liquor on most days.  He reports snoring and daytime sleepiness, has not had sleep study yet.  He chronically sleeps with the head of his bed elevated.  No palpitations, he is in NSR today with PVCs.  No BRBPR/melena.  Breathing is stable, gets short of breath walking up stairs. No chest pain.  BP is elevated today.   ECG  (personally reviewed): NSR, PVC  Labs (1/24): K 3.7, creatinine 0.84, plts 345, hgb 12.3 Labs (2/24): K 4.6, creatinine 1.49, BNP 58 Labs (6/24): K 3.8, creatinine 2.29 => 0.82, hgb 11.4 => 7.9, plts 130 Labs (9/24): LDL 95, hgb 12.6, K 3.7, creatinine 0.72 Labs (11/24): K 4, creatinine 0.7 Labs (3/25): K 4.7, creatinine 0.76, LDL 138  Past Medical History:  Diagnosis Date   Alcohol  abuse    Cervicalgia    CHF (congestive heart failure) (HCC)    COPD (chronic obstructive pulmonary disease) (HCC)    Hyperlipidemia    Hypertension    Seizures (HCC)     Current Outpatient Medications  Medication Sig Dispense Refill   albuterol  (VENTOLIN  HFA) 108 (90 Base) MCG/ACT inhaler Inhale 2 puffs into the lungs every 6 (six) hours as needed for wheezing or shortness of breath. 8 g 2   carbamazepine  (TEGRETOL ) 200 MG tablet Take 1 tablet (200 mg total) by mouth 2 (two) times daily. 180 tablet 1   ferrous sulfate  (SV IRON ) 325 (65 FE) MG tablet Take 1 tablet by mouth once daily 90 tablet 1   fluticasone -salmeterol (ADVAIR DISKUS) 100-50 MCG/ACT AEPB Inhale 1 puff into the lungs 2 (two) times daily. 60 each 11   folic acid  (FOLVITE ) 1 MG tablet Take 1 tablet by mouth once daily 90 tablet 0   furosemide  (LASIX ) 40 MG tablet TAKE 1 TABLET BY MOUTH ONCE DAILY IN THE MORNING 90 tablet 3   Magnesium  Oxide -Mg Supplement (MAG-OXIDE) 200 MG TABS Take 1 tablet (200  mg total) by mouth 2 (two) times daily. 180 tablet 1   potassium chloride  (KLOR-CON ) 10 MEQ tablet Take 20 mEq by mouth daily.     rosuvastatin  (CRESTOR ) 40 MG tablet Take 1 tablet (40 mg total) by mouth daily. 90 tablet 3   Tiotropium Bromide Monohydrate  (SPIRIVA  RESPIMAT) 2.5 MCG/ACT AERS Inhale 2 puffs into the lungs daily. 1 each 11   warfarin (COUMADIN ) 5 MG tablet Take 1-2 tablets daily as instructed by the warfarin clinic 100 tablet 1   empagliflozin  (JARDIANCE ) 10 MG TABS tablet Take 1 tablet (10 mg total) by mouth daily before breakfast.  90 tablet 1   losartan  (COZAAR ) 25 MG tablet Take 1 tablet (25 mg total) by mouth daily. 30 tablet 5   metoprolol  tartrate (LOPRESSOR ) 25 MG tablet Take 1 tablet (25 mg total) by mouth 2 (two) times daily. 60 tablet 3   spironolactone  (ALDACTONE ) 25 MG tablet Take 1 tablet (25 mg total) by mouth daily. 90 tablet 3   No current facility-administered medications for this visit.    No Known Allergies    Social History   Socioeconomic History   Marital status: Widowed    Spouse name: Not on file   Number of children: Not on file   Years of education: Not on file   Highest education level: Not on file  Occupational History   Not on file  Tobacco Use   Smoking status: Every Day    Average packs/day: 1.5 packs/day for 30.0 years (45.0 ttl pk-yrs)    Types: Cigarettes, Cigars    Start date: 12    Last attempt to quit: 2021    Years since quitting: 4.4   Smokeless tobacco: Former   Tobacco comments:    2 cigars a day- 03/19/2024 khj    Quit smoking cigarettes 3 years ago.  Smokes cigarettes since he was 53 years old.  Vaping Use   Vaping status: Never Used  Substance and Sexual Activity   Alcohol  use: Yes    Alcohol /week: 28.0 standard drinks of alcohol     Types: 7 Cans of beer, 21 Shots of liquor per week    Comment: pint of liquor per day   Drug use: No    Types: Marijuana    Comment: quit back in the late 90's   Sexual activity: Yes  Other Topics Concern   Not on file  Social History Narrative   Lives with his Mother Jose [name]    Social Drivers of Health   Financial Resource Strain: Low Risk  (07/01/2019)   Overall Financial Resource Strain (CARDIA)    Difficulty of Paying Living Expenses: Not hard at all  Food Insecurity: No Food Insecurity (04/26/2023)   Hunger Vital Sign    Worried About Running Out of Food in the Last Year: Never true    Ran Out of Food in the Last Year: Never true  Transportation Needs: No Transportation Needs (04/26/2023)   PRAPARE -  Administrator, Civil Service (Medical): No    Lack of Transportation (Non-Medical): No  Physical Activity: Sufficiently Active (07/01/2019)   Exercise Vital Sign    Days of Exercise per Week: 3 days    Minutes of Exercise per Session: 50 min  Stress: No Stress Concern Present (09/02/2019)   [employer] of Occupational Health - Occupational Stress Questionnaire    Feeling of Stress : Not at all  Social Connections: Moderately Isolated (07/01/2019)   Social Connection and Isolation Panel [NHANES]  Frequency of Communication with Friends and Family: Three times a week    Frequency of Social Gatherings with Friends and Family: Three times a week    Attends Religious Services: 1 to 4 times per year    Active Member of Clubs or Organizations: No    Attends Banker Meetings: Never    Marital Status: Widowed  Intimate Partner Violence: Not At Risk (04/26/2023)   Humiliation, Afraid, Rape, and Kick questionnaire    Fear of Current or Ex-Partner: No    Emotionally Abused: No    Physically Abused: No    Sexually Abused: No      Family History  Problem Relation Age of Onset   Hypertension Mother    Diabetes Mother    Colon cancer Mother    Hypertension Father    Multiple sclerosis Father     PHYSICAL EXAM: Vitals:   04/04/24 1535  BP: (!) 140/98  Pulse: 95  SpO2: 95%  General: NAD Neck: No JVD, no thyromegaly or thyroid  nodule.  Lungs: Clear to auscultation bilaterally with normal respiratory effort. CV: Nondisplaced PMI.  Heart regular S1/S2, no S3/S4, no murmur.  No peripheral edema.  No carotid bruit.  Normal pedal pulses.  Abdomen: Soft, nontender, no hepatosplenomegaly, no distention.  Skin: Intact without lesions or rashes.  Neurologic: Alert and oriented x 3.  Psych: Normal affect. Extremities: No clubbing or cyanosis.  HEENT: Normal.   ECHO: 11/11/22: LVEF 50-55%, normal RV function 11/05/21: LVEF 60-65%, normal RV function TEE (3/24):  EF 55%, RV normal Echo (5/25): EF 50-55%, normal RV, 4.5 cm aortic root.   Cardiolite (5/24): EF 45% but visually looks normal, no ischemia/infarction   ASSESSMENT & PLAN:  1.  Chronic diastolic CHF: Echo in 1/24 with EF 50-55%, normal RV in setting of atrial fibrillation with RVR.  TEE in 3/24 with EF 55% and normal RV.  Cardiolite in 5/24 with no ischemia/infarction. Echo in 5/25 showed EF 50-55%, normal RV.  NYHA class II symptoms.  He remains in NSR, not volume overloaded on exam.  I suspect dyspnea is multifactorial with severe COPD playing a role.   - Continue Lasix  40 mg daily, would not increase.  BMET/BNP today.  - Continue Jardiance  10 mg daily.  - Continue spironolactone  12.5 daily.  2. Atrial fibrillation: Paroxysmal.  He was not started on anticoagulation during 1/24 admission due to ETOH abuse, thrombocytopenia, and concerns about fall risk and ability to take anticoagulation consistently. However, anticoagulation was later started and he was cardioverted in 3/24.  He has not had any falls.  He has had GI bleeding (episode in 6/24).  No overt bleeding recently.  Unfortunately, he is on Tegretol  so cannot take Eliquis , Xarelto, or Pradaxa.  GFR is too high for edoxaban.  He is, therefore, on warfarin. NSR today.  Still drinking ETOH.  - Continue warfarin but will increase INR goal to 2-2.5.  - Continue metoprolol  25 mg BID - With daytime sleepiness and snoring, I will arrange for sleep study (will have to be an in-hospital study).  - If he is able to stay off ETOH, will refer to EP for AF ablation. Watchman is also a consideration.     3. Alcohol  abuse: Long history of alcohol  abuse. He still drinks 1/2 pint liquor daily.  - Discussed imperative to continue cutting back ETOH.  4. Thrombocytopenia: ?Related to ETOH abuse.  Recent platelets have been higher.  - CBC today.  5. GI bleeding: Episode of suspected upper  GI bleeding in 6/24 with melena.  EGD showed AVM that was  non-bleeding, he had APC.  Colonoscopy showed diverticulosis.  Bleeding attributed to ETOH abuse.  Warfarin was stopped in hospital but he restarted after discharge.  He denies melena/BRBPR currently.  - Continue warfarin, with no recent GI bleeding will increase INR goal to 2-2.5.  - CBC today.  6. COPD: Severe obstruction on PFTs in 5/24, still smoking cigars. Suspect this contributes to his dyspnea.  - I urged him to quit.  - Follows with pulmonology.  7. Hyperlipidemia: Continue Crestor , check lipids today.   Followup in 3 months.   I spent 32 minutes reviewing records, interviewing/examining patient, and managing orders.   Bueford Arp 04/04/2024

## 2024-04-05 ENCOUNTER — Telehealth: Payer: Self-pay

## 2024-04-05 ENCOUNTER — Ambulatory Visit (HOSPITAL_COMMUNITY): Payer: Self-pay | Admitting: Cardiology

## 2024-04-05 NOTE — Telephone Encounter (Signed)
 Faxed office note, order, demographics and insurance information to sleep works. Spoke to pt at visit and he admits that he didn't answer the text from them last time. He is agreeable to getting it done now.

## 2024-04-06 ENCOUNTER — Encounter: Payer: Self-pay | Admitting: Pulmonary Disease

## 2024-04-10 ENCOUNTER — Ambulatory Visit: Attending: Internal Medicine

## 2024-04-10 DIAGNOSIS — I4819 Other persistent atrial fibrillation: Secondary | ICD-10-CM | POA: Insufficient documentation

## 2024-04-10 DIAGNOSIS — Z5181 Encounter for therapeutic drug level monitoring: Secondary | ICD-10-CM | POA: Diagnosis not present

## 2024-04-10 LAB — POCT INR: INR: 3.4 — AB (ref 2.0–3.0)

## 2024-04-10 NOTE — Patient Instructions (Signed)
 Hold tonight only then Continue 2 TABLETS DAILY, EXCEPT 1 TABLET ON SUNDAY, WEDNESDAY AND SATURDAY.  INR in 3 weeks. [phone]

## 2024-04-18 DIAGNOSIS — Z419 Encounter for procedure for purposes other than remedying health state, unspecified: Secondary | ICD-10-CM | POA: Diagnosis not present

## 2024-04-30 NOTE — Telephone Encounter (Signed)
 Medication was refilled prior to encounter.

## 2024-05-01 ENCOUNTER — Other Ambulatory Visit: Payer: Self-pay | Admitting: Nurse Practitioner

## 2024-05-01 ENCOUNTER — Ambulatory Visit: Attending: Internal Medicine

## 2024-05-01 ENCOUNTER — Ambulatory Visit: Attending: Sleep Medicine

## 2024-05-01 DIAGNOSIS — R0609 Other forms of dyspnea: Secondary | ICD-10-CM | POA: Insufficient documentation

## 2024-05-01 DIAGNOSIS — I10 Essential (primary) hypertension: Secondary | ICD-10-CM | POA: Diagnosis not present

## 2024-05-01 DIAGNOSIS — J449 Chronic obstructive pulmonary disease, unspecified: Secondary | ICD-10-CM | POA: Diagnosis not present

## 2024-05-01 DIAGNOSIS — Z5181 Encounter for therapeutic drug level monitoring: Secondary | ICD-10-CM | POA: Insufficient documentation

## 2024-05-01 DIAGNOSIS — G4733 Obstructive sleep apnea (adult) (pediatric): Secondary | ICD-10-CM | POA: Diagnosis not present

## 2024-05-01 DIAGNOSIS — I509 Heart failure, unspecified: Secondary | ICD-10-CM | POA: Insufficient documentation

## 2024-05-01 DIAGNOSIS — I4819 Other persistent atrial fibrillation: Secondary | ICD-10-CM | POA: Diagnosis not present

## 2024-05-01 LAB — POCT INR: INR: 2.4 (ref 2.0–3.0)

## 2024-05-01 NOTE — Patient Instructions (Signed)
 Continue 2 TABLETS DAILY, EXCEPT 1 TABLET ON SUNDAY, WEDNESDAY AND SATURDAY.  INR in 3 weeks. [phone]

## 2024-05-03 NOTE — Telephone Encounter (Signed)
 Requested Prescriptions  Pending Prescriptions Disp Refills   folic acid  (FOLVITE ) 1 MG tablet [Pharmacy Med Name: Folic Acid  1 MG Oral Tablet] 90 tablet 0    Sig: Take 1 tablet by mouth once daily     Endocrinology:  Vitamins Passed - 05/03/2024  8:45 AM      Passed - Valid encounter within last 12 months    Recent Outpatient Visits           3 months ago Seizures [facility])   [place] [facility] Melvin Pao, NP

## 2024-05-04 ENCOUNTER — Other Ambulatory Visit: Payer: Self-pay | Admitting: Nurse Practitioner

## 2024-05-07 NOTE — Telephone Encounter (Signed)
 Requested by interface surescripts. Duplicate request. Receipt confirmed by pharmacy 05/03/24 at 8:46 am.  Requested Prescriptions  Refused Prescriptions Disp Refills   folic acid  (FOLVITE ) 1 MG tablet [Pharmacy Med Name: Folic Acid  1 MG Oral Tablet] 90 tablet 0    Sig: Take 1 tablet by mouth once daily     Endocrinology:  Vitamins Passed - 05/07/2024  8:34 AM      Passed - Valid encounter within last 12 months    Recent Outpatient Visits           3 months ago Seizures [facility])   [place] [facility] Melvin Pao, NP

## 2024-05-18 DIAGNOSIS — Z419 Encounter for procedure for purposes other than remedying health state, unspecified: Secondary | ICD-10-CM | POA: Diagnosis not present

## 2024-05-20 DIAGNOSIS — R0683 Snoring: Secondary | ICD-10-CM

## 2024-05-22 ENCOUNTER — Ambulatory Visit: Attending: Internal Medicine

## 2024-05-22 DIAGNOSIS — Z5181 Encounter for therapeutic drug level monitoring: Secondary | ICD-10-CM | POA: Insufficient documentation

## 2024-05-22 DIAGNOSIS — I4819 Other persistent atrial fibrillation: Secondary | ICD-10-CM | POA: Diagnosis not present

## 2024-05-22 LAB — POCT INR: INR: 5.3 — AB (ref 2.0–3.0)

## 2024-05-22 NOTE — Patient Instructions (Signed)
 Hold today, Thursday and Friday then Continue 2 TABLETS DAILY, EXCEPT 1 TABLET ON SUNDAY, WEDNESDAY AND SATURDAY.  INR in 2 weeks. [phone]

## 2024-05-24 ENCOUNTER — Telehealth: Payer: Self-pay

## 2024-05-24 NOTE — Telephone Encounter (Signed)
-----   Message from Dedra Sanders sent at 05/24/2024  1:22 PM EDT ----- This study was ordered by Dr. Rolan, it looks like he wanted Dr. Randine Bihari for management.  I will defer to Dr. Jess for management of sleep since I do not primarily manage sleep.  I am okay with either Dr. Randine Bihari or Dr. Jess seeing the patient for sleep.  Dr. KANDICE. ----- Message ----- From: Lang Exie PARAS, CMA Sent: 05/24/2024  12:15 PM EDT To: Dedra LITTIE Sanders, MD   ----- Message ----- From: Jess Devona BIRCH, MD Sent: 05/24/2024  11:55 AM EDT To: [facility]  This is Dr. Evalyn patient's, please find out if she will be ordering BIPAP and seeing patient back or does she want me to see this patient. I have not seen him yet. ----- Message ----- From: Bihari Wilbert SAUNDERS, MD Sent: 05/23/2024   8:42 PM EDT To: Ezra GORMAN Rolan, MD; Geni LITTIE Sar, RN; Broo#  It looks like patient was sent to Dr. Jess with Sleep Medicine at [facility] - please find out if she is ordering BiPAP and seeing patient back ----- Message ----- From: Manda Lyle NOVAK, RN Sent: 05/23/2024  11:28 AM EDT To: Wilbert SAUNDERS Bihari, MD; Geni LITTIE Sar, RN; Cv Di#  Please review. ----- Message ----- From: Sharl Laymon LABOR, RN Sent: 05/22/2024   9:21 AM EDT To: [facility] [place] Triage  To be seen by Dr. Bihari ----- Message ----- From: Rolan Ezra GORMAN, MD Sent: 05/21/2024   4:53 PM EDT To: Laymon LABOR Sharl, RN  Moderate-severe OSA.  Would forward this to our sleep medicine team (Dr. Bihari). ----- Message ----- From: Sharl Laymon LABOR, RN Sent: 05/21/2024   4:28 PM EDT To: Ezra GORMAN Rolan, MD

## 2024-05-24 NOTE — Telephone Encounter (Signed)
 Left VM with callback number to return sleep coordinator call for clarification on BiPAP order.

## 2024-05-24 NOTE — Telephone Encounter (Signed)
-----   Message from Wilbert Bihari sent at 05/23/2024  8:41 PM EDT ----- It looks like patient was sent to Dr. Jess with Sleep Medicine at [facility] - please find out if she is ordering BiPAP and seeing patient back ----- Message ----- From: Manda Lyle NOVAK, RN Sent: 05/23/2024  11:28 AM EDT To: Wilbert JONELLE Bihari, MD; Geni LITTIE Sar, RN; Cv Di#  Please review. ----- Message ----- From: Sharl Laymon LABOR, RN Sent: 05/22/2024   9:21 AM EDT To: [facility] [place] Triage  To be seen by Dr. Bihari ----- Message ----- From: Rolan Ezra RAMAN, MD Sent: 05/21/2024   4:53 PM EDT To: Laymon LABOR Sharl, RN  Moderate-severe OSA.  Would forward this to our sleep medicine team (Dr. Bihari). ----- Message ----- From: Sharl Laymon LABOR, RN Sent: 05/21/2024   4:28 PM EDT To: Ezra RAMAN Rolan, MD

## 2024-05-29 NOTE — Telephone Encounter (Signed)
 I have left the patient a message to get him scheduled with Dr. Jess to go over his sleep study results and get him set up with a CPAP.

## 2024-05-29 NOTE — Telephone Encounter (Signed)
 She will need to need order patients Bipap also  Per CMA with Dr Jess: Noted, thanks. We'll take care of him from here.

## 2024-06-05 ENCOUNTER — Ambulatory Visit: Attending: Internal Medicine

## 2024-06-05 DIAGNOSIS — Z5181 Encounter for therapeutic drug level monitoring: Secondary | ICD-10-CM | POA: Insufficient documentation

## 2024-06-05 DIAGNOSIS — I4819 Other persistent atrial fibrillation: Secondary | ICD-10-CM | POA: Insufficient documentation

## 2024-06-05 LAB — POCT INR: INR: 3.2 — AB (ref 2.0–3.0)

## 2024-06-05 NOTE — Patient Instructions (Signed)
 Take only 0.5 tablet tonight then Continue 2 TABLETS DAILY, EXCEPT 1 TABLET ON SUNDAY, WEDNESDAY AND SATURDAY.  INR in 4 weeks. [phone]

## 2024-06-12 ENCOUNTER — Telehealth: Payer: Self-pay | Admitting: Cardiology

## 2024-06-12 NOTE — Telephone Encounter (Signed)
 Called to confirm/remind patient of their appointment at the [facility] on 06/13/24.   Appointment:   [x] Confirmed  [] Left mess   [] No answer/No voice mail  [] VM Full/unable to leave message  [] Phone not in service  Patient reminded to bring all medications and/or complete list.  Confirmed patient has transportation. Gave directions, instructed to utilize valet parking.

## 2024-06-13 ENCOUNTER — Ambulatory Visit (HOSPITAL_COMMUNITY): Payer: Self-pay | Admitting: Cardiology

## 2024-06-13 ENCOUNTER — Other Ambulatory Visit
Admission: RE | Admit: 2024-06-13 | Discharge: 2024-06-13 | Disposition: A | Discharge status: Home / Self Care | Source: Ambulatory Visit | Attending: Cardiology | Admitting: Cardiology

## 2024-06-13 ENCOUNTER — Encounter: Payer: Self-pay | Admitting: Cardiology

## 2024-06-13 ENCOUNTER — Ambulatory Visit: Admitting: Cardiology

## 2024-06-13 VITALS — BP 131/92 | HR 84 | Wt 228.2 lb

## 2024-06-13 DIAGNOSIS — K5730 Diverticulosis of large intestine without perforation or abscess without bleeding: Secondary | ICD-10-CM | POA: Diagnosis not present

## 2024-06-13 DIAGNOSIS — Z7984 Long term (current) use of oral hypoglycemic drugs: Secondary | ICD-10-CM | POA: Diagnosis not present

## 2024-06-13 DIAGNOSIS — F17210 Nicotine dependence, cigarettes, uncomplicated: Secondary | ICD-10-CM | POA: Insufficient documentation

## 2024-06-13 DIAGNOSIS — Z79899 Other long term (current) drug therapy: Secondary | ICD-10-CM | POA: Insufficient documentation

## 2024-06-13 DIAGNOSIS — I5032 Chronic diastolic (congestive) heart failure: Secondary | ICD-10-CM

## 2024-06-13 DIAGNOSIS — F1010 Alcohol abuse, uncomplicated: Secondary | ICD-10-CM | POA: Insufficient documentation

## 2024-06-13 DIAGNOSIS — I493 Ventricular premature depolarization: Secondary | ICD-10-CM | POA: Diagnosis not present

## 2024-06-13 DIAGNOSIS — Z7901 Long term (current) use of anticoagulants: Secondary | ICD-10-CM | POA: Diagnosis not present

## 2024-06-13 DIAGNOSIS — E785 Hyperlipidemia, unspecified: Secondary | ICD-10-CM | POA: Diagnosis not present

## 2024-06-13 DIAGNOSIS — D6959 Other secondary thrombocytopenia: Secondary | ICD-10-CM | POA: Insufficient documentation

## 2024-06-13 DIAGNOSIS — J449 Chronic obstructive pulmonary disease, unspecified: Secondary | ICD-10-CM | POA: Insufficient documentation

## 2024-06-13 DIAGNOSIS — I4819 Other persistent atrial fibrillation: Secondary | ICD-10-CM | POA: Diagnosis not present

## 2024-06-13 DIAGNOSIS — I440 Atrioventricular block, first degree: Secondary | ICD-10-CM | POA: Diagnosis not present

## 2024-06-13 DIAGNOSIS — I5022 Chronic systolic (congestive) heart failure: Secondary | ICD-10-CM | POA: Diagnosis not present

## 2024-06-13 DIAGNOSIS — I110 Hypertensive heart disease with heart failure: Secondary | ICD-10-CM | POA: Diagnosis not present

## 2024-06-13 LAB — BASIC METABOLIC PANEL WITH GFR
Anion gap: 13 (ref 5–15)
BUN: 23 mg/dL — ABNORMAL HIGH (ref 6–20)
CO2: 25 mmol/L (ref 22–32)
Calcium: 9.4 mg/dL (ref 8.9–10.3)
Chloride: 98 mmol/L (ref 98–111)
Creatinine, Ser: 0.75 mg/dL (ref 0.61–1.24)
GFR, Estimated: >60 mL/min (ref >60)
Glucose, Bld: 97 mg/dL (ref 70–99)
Potassium: 3.9 mmol/L (ref 3.5–5.1)
Sodium: 136 mmol/L (ref 135–145)

## 2024-06-13 LAB — BRAIN NATRIURETIC PEPTIDE: B Natriuretic Peptide: 11.7 pg/mL (ref 0.0–100.0)

## 2024-06-13 MED ORDER — SPIRONOLACTONE 25 MG PO TABS: 25.0000 mg | ORAL_TABLET | Freq: Every day | ORAL | 3 refills | Status: AC

## 2024-06-13 MED ORDER — LOSARTAN POTASSIUM 25 MG PO TABS: 25.0000 mg | ORAL_TABLET | Freq: Every day | ORAL | 3 refills | Status: DC

## 2024-06-13 MED ORDER — METOPROLOL TARTRATE 25 MG PO TABS: 25.0000 mg | ORAL_TABLET | Freq: Two times a day (BID) | ORAL | 3 refills | Status: AC

## 2024-06-13 NOTE — Patient Instructions (Signed)
 Medication Changes:  No medication changes.   Lab Work:  Go over to the [facility]. Go pass the gift shop and have your blood work completed.  We will only call you if the results are abnormal or if the provider would like to make medication changes.  No news is good news.   Follow-Up in: 4 months with Dr. Rolan.  Our Doctors' schedules are NOT open yet for 4 months. We will place you on our recall list. Once they are available, we will call you to schedule your follow up appointment.   Thank you for choosing [facility].    At the [facility], you and your health needs are our priority. We have a designated team specialized in the treatment of Heart Failure. This Care Team includes your primary Heart Failure Specialized Cardiologist (physician), Advanced Practice Providers (APPs- Physician Assistants and Nurse Practitioners), and Pharmacist who all work together to provide you with the care you need, when you need it.   You may see any of the following providers on your designated Care Team at your next follow up:  Dr. Toribio Fuel Dr. Ezra Rolan Dr. Ria Commander Dr. Morene Brownie Ellouise Class, FNP Jaun Bash, RPH-CPP  Please be sure to bring in all your medications bottles to every appointment.   Need to Contact Us :  If you have any questions or concerns before your next appointment please send us  a message through [facility] or call our office at [phone].    TO LEAVE A MESSAGE FOR THE NURSE SELECT OPTION 2, PLEASE LEAVE A MESSAGE INCLUDING: YOUR NAME DATE OF BIRTH CALL BACK NUMBER REASON FOR CALL**this is important as we prioritize the call backs  YOU WILL RECEIVE A CALL BACK THE SAME DAY AS LONG AS YOU CALL BEFORE 4:00 PM

## 2024-06-14 NOTE — Progress Notes (Signed)
 ADVANCED HEART FAILURE CLINIC NOTE  Referring Physician: Melvin Pao, NP  Primary Care: Melvin Pao, NP HF Cardiology: Dr. Gardenia  HPI: Jose [name]. is a 53 y.o. male with hypertension, hyperlipidemia, COPD, significant alcohol  abuse, history of seizures, persistent atrial fibrillation.  Patient was admitted in 1/24 with fevers chills cough and fatigue.  He presented hypotensive to 60s/40s with new onset atrial fibrillation with RVR. TTE during admit with LVEF of 50-55%, moderate biatrial enlargement, normal RV. He was seen by neurology for possible alcohol  withdrawal related seizures and continued on home tegretol  (undetectable levels on admit). For his atrial fibrillation he was discharged home on cardizem  & lopressor . Anticoagulation was not started due to concern for continued alcohol  abuse and thrombocytopenia.   Patient was ultimately started on warfarin and had TEE-guided DCCV in 3/24.  TEE showed EF 55%, normal RV, mild LAE.    PFTs in 5/24 showed severe COPD. Cardiolite in 5/24 showed no ischemia or infarction.   Patient was admitted in 6/24 with melena and AKI, creatinine up to 2.29 and hgb down to 7.9.  He was transfused.  EGD showed a single AVM, nonbleeding, treated with APC. Colonoscopy showed diverticulosis.  Due to low BP, diltiazem  CD and Entresto  were stopped.  Warfarin was held while in the hospital but was restarted at discharge.   Echo in 5/25 showed EF 50-55%, normal RV, 4.5 cm aortic root.   He returns today for followup of CHF and atrial fibrillation.  Still smoking at least 2 cigars/day.  He is still drinking at least 1/2 pint liquor on most days.  Nothing has changed in terms of substance use.  He reports snoring and daytime sleepiness, has not had sleep study yet.  He is in NSR today, no palpitations.  No BRBPR/melena.  Breathing stable, notes dyspnea with stairs but ok on flat ground. No chest pain, no lightheadedness. Weight down 2 lbs.   ECG  (personally reviewed): NSR, 1st degree AVB, PVC  Labs (1/24): K 3.7, creatinine 0.84, plts 345, hgb 12.3 Labs (2/24): K 4.6, creatinine 1.49, BNP 58 Labs (6/24): K 3.8, creatinine 2.29 => 0.82, hgb 11.4 => 7.9, plts 130 Labs (9/24): LDL 95, hgb 12.6, K 3.7, creatinine 0.72 Labs (11/24): K 4, creatinine 0.7 Labs (3/25): K 4.7, creatinine 0.76, LDL 138 Labs (5/25): LDL 90, BNP 38, K 4, creatinine 0.71  Past Medical History:  Diagnosis Date   Alcohol  abuse    Cervicalgia    CHF (congestive heart failure) (HCC)    COPD (chronic obstructive pulmonary disease) (HCC)    Hyperlipidemia    Hypertension    Seizures (HCC)     Current Outpatient Medications  Medication Sig Dispense Refill   albuterol  (VENTOLIN  HFA) 108 (90 Base) MCG/ACT inhaler Inhale 2 puffs into the lungs every 6 (six) hours as needed for wheezing or shortness of breath. 8 g 2   carbamazepine  (TEGRETOL ) 200 MG tablet Take 1 tablet (200 mg total) by mouth 2 (two) times daily. 180 tablet 1   empagliflozin  (JARDIANCE ) 10 MG TABS tablet Take 1 tablet (10 mg total) by mouth daily before breakfast. 90 tablet 1   ferrous sulfate  (SV IRON ) 325 (65 FE) MG tablet Take 1 tablet by mouth once daily 90 tablet 1   fluticasone -salmeterol (ADVAIR DISKUS) 100-50 MCG/ACT AEPB Inhale 1 puff into the lungs 2 (two) times daily. 60 each 11   folic acid  (FOLVITE ) 1 MG tablet Take 1 tablet by mouth once daily 90 tablet 0  furosemide  (LASIX ) 40 MG tablet TAKE 1 TABLET BY MOUTH ONCE DAILY IN THE MORNING 90 tablet 3   potassium chloride  (KLOR-CON ) 10 MEQ tablet Take 20 mEq by mouth daily.     rosuvastatin  (CRESTOR ) 40 MG tablet Take 1 tablet (40 mg total) by mouth daily. 90 tablet 3   Tiotropium Bromide Monohydrate  (SPIRIVA  RESPIMAT) 2.5 MCG/ACT AERS Inhale 2 puffs into the lungs daily. 1 each 11   warfarin (COUMADIN ) 5 MG tablet Take 1-2 tablets daily as instructed by the warfarin clinic 100 tablet 1   losartan  (COZAAR ) 25 MG tablet Take 1 tablet (25  mg total) by mouth daily. 90 tablet 3   Magnesium  Oxide -Mg Supplement (MAG-OXIDE) 200 MG TABS Take 1 tablet (200 mg total) by mouth 2 (two) times daily. (Patient not taking: Reported on 06/13/2024) 180 tablet 1   metoprolol  tartrate (LOPRESSOR ) 25 MG tablet Take 1 tablet (25 mg total) by mouth 2 (two) times daily. 180 tablet 3   spironolactone  (ALDACTONE ) 25 MG tablet Take 1 tablet (25 mg total) by mouth daily. 90 tablet 3   No current facility-administered medications for this visit.    No Known Allergies    Social History   Socioeconomic History   Marital status: Widowed    Spouse name: Not on file   Number of children: Not on file   Years of education: Not on file   Highest education level: Not on file  Occupational History   Not on file  Tobacco Use   Smoking status: Every Day    Average packs/day: 1.5 packs/day for 30.0 years (45.0 ttl pk-yrs)    Types: Cigarettes, Cigars    Start date: 60    Last attempt to quit: 2021    Years since quitting: 4.6   Smokeless tobacco: Former   Tobacco comments:    2 cigars a day- 03/19/2024 khj    Quit smoking cigarettes 3 years ago.  Smokes cigarettes since he was 53 years old.  Vaping Use   Vaping status: Never Used  Substance and Sexual Activity   Alcohol  use: Yes    Alcohol /week: 28.0 standard drinks of alcohol     Types: 7 Cans of beer, 21 Shots of liquor per week    Comment: pint of liquor per day   Drug use: No    Types: Marijuana    Comment: quit back in the late 90's   Sexual activity: Yes  Other Topics Concern   Not on file  Social History Narrative   Lives with his Mother Barnie    Social Drivers of Health   Financial Resource Strain: Low Risk  (07/01/2019)   Overall Financial Resource Strain (CARDIA)    Difficulty of Paying Living Expenses: Not hard at all  Food Insecurity: No Food Insecurity (04/26/2023)   Hunger Vital Sign    Worried About Running Out of Food in the Last Year: Never true    Ran Out of Food in  the Last Year: Never true  Transportation Needs: No Transportation Needs (04/26/2023)   PRAPARE - Administrator, Civil Service (Medical): No    Lack of Transportation (Non-Medical): No  Physical Activity: Sufficiently Active (07/01/2019)   Exercise Vital Sign    Days of Exercise per Week: 3 days    Minutes of Exercise per Session: 50 min  Stress: No Stress Concern Present (09/02/2019)   [employer] of Occupational Health - Occupational Stress Questionnaire    Feeling of Stress : Not at all  Social Connections: Moderately Isolated (07/01/2019)   Social Connection and Isolation Panel    Frequency of Communication with Friends and Family: Three times a week    Frequency of Social Gatherings with Friends and Family: Three times a week    Attends Religious Services: 1 to 4 times per year    Active Member of Clubs or Organizations: No    Attends Banker Meetings: Never    Marital Status: Widowed  Intimate Partner Violence: Not At Risk (04/26/2023)   Humiliation, Afraid, Rape, and Kick questionnaire    Fear of Current or Ex-Partner: No    Emotionally Abused: No    Physically Abused: No    Sexually Abused: No      Family History  Problem Relation Age of Onset   Hypertension Mother    Diabetes Mother    Colon cancer Mother    Hypertension Father    Multiple sclerosis Father     PHYSICAL EXAM: Vitals:   06/13/24 1505  BP: (!) 131/92  Pulse: 84  SpO2: 97%  General: NAD Neck: No JVD, no thyromegaly or thyroid  nodule.  Lungs: Clear to auscultation bilaterally with normal respiratory effort. CV: Nondisplaced PMI.  Heart regular S1/S2, no S3/S4, no murmur.  No peripheral edema.  No carotid bruit.  Normal pedal pulses.  Abdomen: Soft, nontender, no hepatosplenomegaly, no distention.  Skin: Intact without lesions or rashes.  Neurologic: Alert and oriented x 3.  Psych: Normal affect. Extremities: No clubbing or cyanosis.  HEENT: Normal.   ECHO: 11/11/22:  LVEF 50-55%, normal RV function 11/05/21: LVEF 60-65%, normal RV function TEE (3/24): EF 55%, RV normal Echo (5/25): EF 50-55%, normal RV, 4.5 cm aortic root.   Cardiolite (5/24): EF 45% but visually looks normal, no ischemia/infarction   ASSESSMENT & PLAN:  1.  Chronic diastolic CHF: Echo in 1/24 with EF 50-55%, normal RV in setting of atrial fibrillation with RVR.  TEE in 3/24 with EF 55% and normal RV.  Cardiolite in 5/24 with no ischemia/infarction. Echo in 5/25 showed EF 50-55%, normal RV.  NYHA class II symptoms.  He remains in NSR, not volume overloaded on exam.  I suspect dyspnea is multifactorial with severe COPD playing a role.   - Continue Lasix  40 mg daily.  BMET/BNP today.  - Continue Jardiance  10 mg daily.  - Continue spironolactone  12.5 daily.  2. Atrial fibrillation: Paroxysmal.  He was not started on anticoagulation during 1/24 admission due to ETOH abuse, thrombocytopenia, and concerns about fall risk and ability to take anticoagulation consistently. However, anticoagulation was later started and he was cardioverted in 3/24.  He has not had any falls.  He has had GI bleeding (episode in 6/24).  No overt bleeding recently.  Unfortunately, he is on Tegretol  so cannot take Eliquis , Xarelto, or Pradaxa.  GFR is too high for edoxaban.  He is, therefore, on warfarin. NSR today.  Still drinking ETOH.  - Continue warfarin INR goal to 2-2.5. CBC today.  - Continue metoprolol  25 mg BID - With daytime sleepiness and snoring, still trying to arrange sleep study. Will have to be in hospital.  - If he is able to stay off ETOH, will refer to EP for AF ablation. Watchman is also a consideration.     3. Alcohol  abuse: Long history of alcohol  abuse. He still drinks 1/2 pint liquor daily.   - Discussed imperative to continue cutting back ETOH. I do not think that he is interested in cutting back.  4. Thrombocytopenia: ?  Related to ETOH abuse.  Recent platelets have been higher.  - CBC today.  5.  GI bleeding: Episode of suspected upper GI bleeding in 6/24 with melena.  EGD showed AVM that was non-bleeding, he had APC.  Colonoscopy showed diverticulosis.  Bleeding attributed to ETOH abuse.  Warfarin was stopped in hospital but he restarted after discharge.  He denies melena/BRBPR currently.  - Continue warfarin, with no recent GI bleeding INR goal was increased to 2-2.5.  - CBC today.  6. COPD: Severe obstruction on PFTs in 5/24, still smoking cigars. Suspect this contributes to his dyspnea.  - I urged him to quit.  - Follows with pulmonology.  7. Hyperlipidemia: Continue Crestor , check lipids today.   Followup in 4 months.   I spent 22 minutes reviewing records, interviewing/examining patient, and managing orders.   Mercy Malena 06/14/2024

## 2024-06-18 DIAGNOSIS — Z419 Encounter for procedure for purposes other than remedying health state, unspecified: Secondary | ICD-10-CM | POA: Diagnosis not present

## 2024-06-19 ENCOUNTER — Encounter: Payer: Self-pay | Admitting: Pulmonary Disease

## 2024-06-19 ENCOUNTER — Ambulatory Visit: Admitting: Pulmonary Disease

## 2024-06-19 ENCOUNTER — Ambulatory Visit (INDEPENDENT_AMBULATORY_CARE_PROVIDER_SITE_OTHER): Admitting: Pulmonary Disease

## 2024-06-19 VITALS — BP 124/78 | HR 90 | Temp 97.1°F | Ht 70.0 in | Wt 226.0 lb

## 2024-06-19 DIAGNOSIS — R0609 Other forms of dyspnea: Secondary | ICD-10-CM

## 2024-06-19 DIAGNOSIS — J449 Chronic obstructive pulmonary disease, unspecified: Secondary | ICD-10-CM

## 2024-06-19 DIAGNOSIS — F17200 Nicotine dependence, unspecified, uncomplicated: Secondary | ICD-10-CM

## 2024-06-19 DIAGNOSIS — G4734 Idiopathic sleep related nonobstructive alveolar hypoventilation: Secondary | ICD-10-CM | POA: Diagnosis not present

## 2024-06-19 DIAGNOSIS — G4733 Obstructive sleep apnea (adult) (pediatric): Secondary | ICD-10-CM

## 2024-06-19 DIAGNOSIS — F17210 Nicotine dependence, cigarettes, uncomplicated: Secondary | ICD-10-CM

## 2024-06-19 LAB — PULMONARY FUNCTION TEST
DL/VA % pred: 77 %
DL/VA: 3.37 ml/min/mmHg/L
DLCO unc % pred: 56 %
DLCO unc: 16.17 ml/min/mmHg
FEF 25-75 Post: 1.28 L/s
FEF 25-75 Pre: 1.1 L/s
FEF2575-%Change-Post: 16 %
FEF2575-%Pred-Post: 39 %
FEF2575-%Pred-Pre: 33 %
FEV1-%Change-Post: 11 %
FEV1-%Pred-Post: 51 %
FEV1-%Pred-Pre: 45 %
FEV1-Post: 1.96 L
FEV1-Pre: 1.75 L
FEV1FVC-%Change-Post: 17 %
FEV1FVC-%Pred-Pre: 81 %
FEV6-%Change-Post: -3 %
FEV6-%Pred-Post: 55 %
FEV6-%Pred-Pre: 58 %
FEV6-Post: 2.66 L
FEV6-Pre: 2.76 L
FEV6FVC-%Change-Post: 0 %
FEV6FVC-%Pred-Post: 103 %
FEV6FVC-%Pred-Pre: 102 %
FVC-%Change-Post: -4 %
FVC-%Pred-Post: 53 %
FVC-%Pred-Pre: 56 %
FVC-Post: 2.66 L
FVC-Pre: 2.79 L
Post FEV1/FVC ratio: 74 %
Post FEV6/FVC ratio: 100 %
Pre FEV1/FVC ratio: 63 %
Pre FEV6/FVC Ratio: 99 %
RV % pred: 137 %
RV: 2.92 L
TLC % pred: 80 %
TLC: 5.62 L

## 2024-06-19 NOTE — Progress Notes (Unsigned)
 Subjective:    Patient ID: Jose [name]., male    DOB: [birth year], 53 y.o.   MRN: [id number]  Patient Care Team: Melvin Pao, NP as PCP - General Merlynn Lyle CROME, LCSW as Social Worker (Licensed Clinical Social Worker) Tamea Dedra CROME, MD as Consulting Physician (Pulmonary Disease) Malachy Comer GAILS, NP as Nurse Practitioner (Nurse Practitioner)  Chief Complaint  Patient presents with   Follow-up    DOE. No wheezing. Occasional cough.    BACKGROUND/INTERVAL:Patient is a 53 year old current cigar smoker with 33-pack-year history of cigarette smoking who presents for follow-up on the issue of shortness of breath.  He has stage III COPD.  Currently maintained on Advair discus and Spiriva  uses albuterol  as well.  Last visit here was on 19 Mar 2024 with me.  No hospitalizations since his last visit. No exacerbations since then.  Since his last visit was diagnosed with sleep apnea by study ordered by cardiology patient has been reluctant to get BiPAP, discussed importance of same.   HPI Discussed the use of AI scribe software for clinical note transcription with the patient, who gave verbal consent to proceed.  History of Present Illness   Jose [name]. is a 53 year old male with severe COPD who presents for follow-up.  He presents with his mother [name].  He feels a little tired. He continues to smoke despite efforts to quit. He uses regular oxygen  therapy nocturnally at home but has not been using a BiPAP mask at night.  He had a split-night study on 01 May 2024 with recommendation of BiPAP. During the split-night study, he was given a BiPAP mask which he found uncomfortable, describing it as suffocating. He currently uses oxygen  therapy at night but has not transitioned to using a BiPAP machine. He has been reluctant to get therapy, discussed the importance of the therapy.  He states that he will try the BiPAP if prescribed.  We can refer to our sleep specialist if further  training is necessary.  He recently visited his cardiologist who found his heart to be in rhythm and noted improvement in his echocardiogram.   He does not endorse any other symptomatology currently.    DATA 04/27/2022 PFTs: FEV1 1.43 L or 43% predicted, FVC 2.39 L or 57% predicted, FEV1/FVC 60%, no bronchodilator response.  There is significant air trapping.  Mild restrictive physiology on the basis of obesity (ERV 37%) diffusion capacity moderately reduced.  Consistent with severe obstructive defect with concomitant mild restrictive defect on the basis of obesity. 03/14/2023 PFTs: FEV1 1.10 L or 28% predicted, FVC 2.34 L or 46% predicted, FEV1/FVC 47%, no bronchodilator response.  Patient unable to perform DLCO due to poor understanding of instructions. 08/16/2023 LDCT chest: Centrilobular and paraseptal emphysema.  Right lung nodule identified.  Patient's pulmonary nodules of mass.  Enlargement of pleural tract/main pulmonary arteries suggestive of pulmonary arterial hypertension.  RADS 2.  Benign appearance and behavior.   Review of Systems A 10 point review of systems was performed and it is as noted above otherwise negative.   Patient Active Problem List   Diagnosis Date Noted   Nocturnal hypoxemia 06/30/2023   Upper GI bleed 04/26/2023   Blood loss anemia 04/26/2023   AKI (acute kidney injury) (HCC) 04/26/2023   Hyperkalemia 04/26/2023   Alcohol  use disorder 04/26/2023   Persistent atrial fibrillation (HCC) 12/19/2022   Seizure disorder (HCC) 11/11/2022   Acute metabolic encephalopathy 11/11/2022   Thrombocytopenia (HCC) 11/11/2022   Elevated  LFTs 11/11/2022   Atrial fibrillation with RVR (HCC) 11/11/2022   Right knee pain 11/11/2022   Hypomagnesemia 11/11/2022   Prediabetes 08/24/2022   Acute CHF (congestive heart failure) (HCC) 12/21/2021   Alcoholic intoxication without complication (HCC) 02/22/2021   Chronic diastolic CHF (congestive heart failure) (HCC) 09/08/2020    Iron  deficiency anemia due to chronic blood loss    Family history of colon cancer 06/28/2016   Seizures (HCC) 04/30/2015   Hypertension 04/30/2015   Alcohol  abuse 04/30/2015   Epilepsy (HCC) 04/30/2015   Tobacco dependence 04/30/2015   COPD (chronic obstructive pulmonary disease) (HCC) 04/30/2015   Lumbago 04/30/2015   Hypercholesterolemia 04/30/2015   Chronic pain 04/30/2015   Neck pain 04/22/2013    Social History   Tobacco Use   Smoking status: Every Day    Average packs/day: 1.5 packs/day for 30.0 years (45.0 ttl pk-yrs)    Types: Cigarettes, Cigars    Start date: 16    Last attempt to quit: 2021    Years since quitting: 4.6   Smokeless tobacco: Former   Tobacco comments:    1.5 cigars daily- khj 06/19/2024    Quit smoking cigarettes 3 years ago.  Smokes cigarettes since he was 53 years old.  Substance Use Topics   Alcohol  use: Yes    Alcohol /week: 28.0 standard drinks of alcohol     Types: 7 Cans of beer, 21 Shots of liquor per week    Comment: pint of liquor per day    No Known Allergies  Current Meds  Medication Sig   albuterol  (VENTOLIN  HFA) 108 (90 Base) MCG/ACT inhaler Inhale 2 puffs into the lungs every 6 (six) hours as needed for wheezing or shortness of breath.   carbamazepine  (TEGRETOL ) 200 MG tablet Take 1 tablet (200 mg total) by mouth 2 (two) times daily.   empagliflozin  (JARDIANCE ) 10 MG TABS tablet Take 1 tablet (10 mg total) by mouth daily before breakfast.   ferrous sulfate  (SV IRON ) 325 (65 FE) MG tablet Take 1 tablet by mouth once daily   fluticasone -salmeterol (ADVAIR DISKUS) 100-50 MCG/ACT AEPB Inhale 1 puff into the lungs 2 (two) times daily.   folic acid  (FOLVITE ) 1 MG tablet Take 1 tablet by mouth once daily   furosemide  (LASIX ) 40 MG tablet TAKE 1 TABLET BY MOUTH ONCE DAILY IN THE MORNING   losartan  (COZAAR ) 25 MG tablet Take 1 tablet (25 mg total) by mouth daily.   metoprolol  tartrate (LOPRESSOR ) 25 MG tablet Take 1 tablet (25 mg total) by  mouth 2 (two) times daily.   potassium chloride  (KLOR-CON ) 10 MEQ tablet Take 20 mEq by mouth daily.   rosuvastatin  (CRESTOR ) 40 MG tablet Take 1 tablet (40 mg total) by mouth daily.   spironolactone  (ALDACTONE ) 25 MG tablet Take 1 tablet (25 mg total) by mouth daily.   Tiotropium Bromide Monohydrate  (SPIRIVA  RESPIMAT) 2.5 MCG/ACT AERS Inhale 2 puffs into the lungs daily.   warfarin (COUMADIN ) 5 MG tablet Take 1-2 tablets daily as instructed by the warfarin clinic    Immunization History  Administered Date(s) Administered   Influenza, Seasonal, Injecte, Preservative Fre 10/26/2023   Influenza,inj,Quad PF,6+ Mos 08/17/2022   Influenza-Unspecified 08/22/2019   PFIZER(Purple Top)SARS-COV-2 Vaccination 09/14/2020, 10/19/2020   PNEUMOCOCCAL CONJUGATE-20 08/26/2022        Objective:     BP 124/78 (BP Location: Right Arm, Cuff Size: Large)   Pulse 90   Temp (!) 97.1 F (36.2 C)   Ht 5' 10 (1.778 m)   Wt 226 lb (  102.5 kg)   SpO2 97%   BMI 32.43 kg/m   SpO2: 97 % O2 Device: None (Room air)  GENERAL: Well-developed, overweight gentleman, no acute distress, no gait disturbance, very nasal quality to speech.  No conversational dyspnea. HEAD: Normocephalic, atraumatic.  EYES: Pupils equal, round, reactive to light.  No scleral icterus.  MOUTH: Poor dentition, oral mucosa moist. NECK: Supple. No thyromegaly. Trachea midline. No JVD.  No adenopathy. PULMONARY: Good air entry bilaterally.  Coarse, otherwise no adventitious sounds. CARDIOVASCULAR: S1 and S2.  Regular rate and rhythm.  No rubs, murmurs or gallops heard. ABDOMEN: Benign. MUSCULOSKELETAL: No joint deformity, mild early clubbing, no edema.  NEUROLOGIC: No overt focal deficit, no gait disturbance, speech is fluent. SKIN: Intact,warm,dry. PSYCH: Mood and behavior normal.   Pulmonary function testing performed today shows FEV1 of 1.75 L or 45% predicted, FVC of 2.79 L or 56% predicted, FEV1/FVC of 63%, no significant  bronchodilator response, lung volumes show significant air trapping.  Diffusion capacity moderately reduced.  Consider severe obstructive defect emphysematous type.  Discussed with patient.     Assessment & Plan:     ICD-10-CM   1. Stage 3 severe COPD by GOLD classification (HCC)  J44.9     2. OSA (obstructive sleep apnea)  G47.33 AMB REFERRAL FOR DME    3. Exertional dyspnea  R06.09     4. Nocturnal hypoxemia  G47.34     5. Tobacco dependence  F17.200       Orders Placed This Encounter  Procedures   AMB REFERRAL FOR DME    Referral Priority:   Routine    Referral Type:   Durable Medical Equipment Purchase    Number of Visits Requested:   1   Discussion:    Severe chronic obstructive pulmonary disease (COPD) Well-managed severe COPD with no deterioration in lung function. Continues tobacco use, posing a risk to lung function preservation. - Encourage smoking cessation to preserve lung function.  Obstructive sleep apnea Diagnosed with obstructive sleep apnea. Previous CPAP trial was uncomfortable. BiPAP is recommended for pressure support during inhalation and reduced pressure during exhalation, potentially increasing comfort. BiPAP will be integrated with existing oxygen  setup for home use. - Order BiPAP machine with automatic adjustment and oxygen  integration.  Tobacco use disorder Continues tobacco use despite known risks to lung health and COPD management. - Encourage smoking cessation.      Advised if symptoms do not improve or worsen, to please contact office for sooner follow up or seek emergency care.    I spent 35 minutes of dedicated to the care of this patient on the date of this encounter to include pre-visit review of records, face-to-face time with the patient discussing conditions above, post visit ordering of testing, clinical documentation with the electronic health record, making appropriate referrals as documented, and communicating necessary findings to  members of the patients care team.     C. Leita Sanders, MD Advanced Bronchoscopy PCCM [facility]    *This note was generated using voice recognition software/Dragon and/or AI transcription program.  Despite best efforts to proofread, errors can occur which can change the meaning. Any transcriptional errors that result from this process are unintentional and may not be fully corrected at the time of dictation.

## 2024-06-19 NOTE — Patient Instructions (Signed)
 Full PFT completed today ? ?

## 2024-06-19 NOTE — Patient Instructions (Signed)
 VISIT SUMMARY:  You came in today for a follow-up visit regarding your severe COPD. You mentioned feeling a bit tired and shared that you are still smoking despite trying to quit. You are using regular oxygen  therapy at home but have not been using a CPAP mask at night. A sleep study in June showed you have sleep apnea, and you found the CPAP mask uncomfortable. You recently saw a cardiologist who noted that your heart is in rhythm and its pumping function has improved.  YOUR PLAN:  -SEVERE CHRONIC OBSTRUCTIVE PULMONARY DISEASE (COPD): COPD is a chronic lung disease that makes it hard to breathe. Your COPD is well-managed with no worsening in lung function, but continuing to smoke can harm your lungs further. It is important to stop smoking to help preserve your lung function.  -OBSTRUCTIVE SLEEP APNEA: Obstructive sleep apnea is a condition where your breathing stops and starts during sleep. Since the CPAP mask was uncomfortable for you, a BiPAP machine is recommended. This machine provides pressure support during inhalation and reduces pressure during exhalation, which may be more comfortable for you. It will be set up to work with your current oxygen  therapy.  -TOBACCO USE DISORDER: Tobacco use disorder means you are dependent on tobacco, which is harmful to your health, especially with COPD. It is crucial to quit smoking to improve your lung health and overall well-being.  INSTRUCTIONS:  Please follow up with the recommended BiPAP machine setup and continue using your oxygen  therapy. It is very important to work on quitting smoking to help manage your COPD and improve your overall health. If you need support or resources to quit smoking, please let us  know.

## 2024-06-19 NOTE — Progress Notes (Signed)
 Full PFT completed today ? ?

## 2024-06-20 ENCOUNTER — Ambulatory Visit: Payer: Self-pay | Admitting: Pulmonary Disease

## 2024-06-28 ENCOUNTER — Encounter: Payer: Self-pay | Admitting: Pulmonary Disease

## 2024-07-03 ENCOUNTER — Ambulatory Visit: Attending: Internal Medicine

## 2024-07-03 DIAGNOSIS — I4819 Other persistent atrial fibrillation: Secondary | ICD-10-CM | POA: Diagnosis not present

## 2024-07-03 DIAGNOSIS — Z5181 Encounter for therapeutic drug level monitoring: Secondary | ICD-10-CM | POA: Insufficient documentation

## 2024-07-03 LAB — POCT INR: INR: 3.7 — AB (ref 2.0–3.0)

## 2024-07-03 NOTE — Patient Instructions (Signed)
 Hold tonight only and eat greens then Continue 2 TABLETS DAILY, EXCEPT 1 TABLET ON SUNDAY, WEDNESDAY AND SATURDAY.  INR in 3 weeks. [phone]

## 2024-07-16 ENCOUNTER — Telehealth (INDEPENDENT_AMBULATORY_CARE_PROVIDER_SITE_OTHER): Payer: Self-pay | Admitting: Sleep Medicine

## 2024-07-16 NOTE — Telephone Encounter (Signed)
 Sleep study results scanned and forwarded. Recommend Auto Bipap therapy Min EPAP 4 cm H2O, Max EPAP 22 cm H2O, Pressure support 5 cm H2O.

## 2024-07-19 DIAGNOSIS — Z419 Encounter for procedure for purposes other than remedying health state, unspecified: Secondary | ICD-10-CM | POA: Diagnosis not present

## 2024-07-24 ENCOUNTER — Encounter

## 2024-07-24 ENCOUNTER — Ambulatory Visit: Attending: Internal Medicine

## 2024-07-24 DIAGNOSIS — I4819 Other persistent atrial fibrillation: Secondary | ICD-10-CM | POA: Diagnosis not present

## 2024-07-24 DIAGNOSIS — Z5181 Encounter for therapeutic drug level monitoring: Secondary | ICD-10-CM | POA: Diagnosis not present

## 2024-07-24 LAB — POCT INR: INR: 1.8 — AB (ref 2.0–3.0)

## 2024-07-24 NOTE — Patient Instructions (Signed)
 Take 1.5 tablets today only then Continue 2 TABLETS DAILY, EXCEPT 1 TABLET ON SUNDAY, WEDNESDAY AND SATURDAY.  INR in 3 weeks. [phone]

## 2024-07-30 ENCOUNTER — Ambulatory Visit (INDEPENDENT_AMBULATORY_CARE_PROVIDER_SITE_OTHER): Admitting: Nurse Practitioner

## 2024-07-30 ENCOUNTER — Encounter: Payer: Self-pay | Admitting: Nurse Practitioner

## 2024-07-30 VITALS — BP 148/78 | HR 88 | Temp 98.7°F | Ht 70.0 in | Wt 225.8 lb

## 2024-07-30 DIAGNOSIS — I10 Essential (primary) hypertension: Secondary | ICD-10-CM | POA: Diagnosis not present

## 2024-07-30 DIAGNOSIS — R569 Unspecified convulsions: Secondary | ICD-10-CM

## 2024-07-30 DIAGNOSIS — D500 Iron deficiency anemia secondary to blood loss (chronic): Secondary | ICD-10-CM

## 2024-07-30 DIAGNOSIS — E7800 Pure hypercholesterolemia, unspecified: Secondary | ICD-10-CM

## 2024-07-30 DIAGNOSIS — I5032 Chronic diastolic (congestive) heart failure: Secondary | ICD-10-CM | POA: Diagnosis not present

## 2024-07-30 DIAGNOSIS — J441 Chronic obstructive pulmonary disease with (acute) exacerbation: Secondary | ICD-10-CM

## 2024-07-30 DIAGNOSIS — F17200 Nicotine dependence, unspecified, uncomplicated: Secondary | ICD-10-CM

## 2024-07-30 DIAGNOSIS — R7303 Prediabetes: Secondary | ICD-10-CM

## 2024-07-30 DIAGNOSIS — Z23 Encounter for immunization: Secondary | ICD-10-CM | POA: Diagnosis not present

## 2024-07-30 DIAGNOSIS — I4819 Other persistent atrial fibrillation: Secondary | ICD-10-CM | POA: Diagnosis not present

## 2024-07-30 DIAGNOSIS — F1010 Alcohol abuse, uncomplicated: Secondary | ICD-10-CM

## 2024-07-30 DIAGNOSIS — D696 Thrombocytopenia, unspecified: Secondary | ICD-10-CM | POA: Diagnosis not present

## 2024-07-30 MED ORDER — ALBUTEROL SULFATE HFA 108 (90 BASE) MCG/ACT IN AERS: 2.0000 | INHALATION_SPRAY | Freq: Four times a day (QID) | RESPIRATORY_TRACT | 2 refills | Status: AC | PRN

## 2024-07-30 MED ORDER — FERROUS SULFATE 325 (65 FE) MG PO TABS: 325.0000 mg | ORAL_TABLET | Freq: Every day | ORAL | 1 refills | Status: AC

## 2024-07-30 MED ORDER — FOLIC ACID 1 MG PO TABS: 1.0000 mg | ORAL_TABLET | Freq: Every day | ORAL | 1 refills | Status: AC

## 2024-07-30 MED ORDER — CLOTRIMAZOLE-BETAMETHASONE 1-0.05 % EX CREA: 1.0000 | TOPICAL_CREAM | Freq: Every day | CUTANEOUS | 0 refills | Status: AC

## 2024-07-30 NOTE — Assessment & Plan Note (Signed)
 Secondary to alcohol  use.  CBC checked at visit today.

## 2024-07-30 NOTE — Assessment & Plan Note (Signed)
 Chronic.  Still smoking 2 cigars per day.

## 2024-07-30 NOTE — Assessment & Plan Note (Signed)
 Chronic.  Controlled.  Continue with current medication regimen of Metoprolol  and Eliquis .  Labs ordered today.  Return to clinic in 6 months for reevaluation.  Call sooner if concerns arise.

## 2024-07-30 NOTE — Assessment & Plan Note (Signed)
 Chronic, controlled. Appears controlled with current regimen comprised of rosuvastatin  20 mg PO every day  Continue current regimen  Recheck lipid panel reviewed from Cardiology visit.  Follow up in 6 months or sooner if concerns arise

## 2024-07-30 NOTE — Assessment & Plan Note (Signed)
 Chronic, ongoing  Patient is followed by Pulmonology  Currently on Breztri . He is using Albuterol  inhaler about 2 times per day - unsure of neb use  Diagnosed with OSA- did not tolerate CPAP. Currently trying to use BIPAP Continue current regimen for now, will collaborate with Pulm for management  Encouraged smoking cessation. Follow up in 6 months or sooner if concerns arise

## 2024-07-30 NOTE — Assessment & Plan Note (Signed)
 Chronic, ongoing Continues to drink about 1/2 pint every two days Reviewed that alcohol  cessation can be dangerous and can lead to severe health issues if not supervised  Recommend he continues very gradual reduction and has close follow up with PCP and neurology to help prevent withdrawal complications Follow up in 6 months or sooner if concerns arise

## 2024-07-30 NOTE — Assessment & Plan Note (Signed)
 Chronic, controlled Appears managed with Losartan  12.5 PO at bedtime, lasix  40 mg PO every day, spironolactone  12.5 mg PO every day, Metoprolol  25 mg PO BID Continue to follow with Cardiology - medications are managed by specialty  Follow up in 6 months or sooner if concerns arise

## 2024-07-30 NOTE — Assessment & Plan Note (Signed)
 Chronic, controlled Appears managed with Jardiance  10 mg PO every day, Lasix  40 mg PO every day  Cardiology appears to be monitoring and managing medications Appears Euvolemic in office today.  Continue current regimen and Cardiology follow ups Follow up in 6 months or sooner if concerns arise   - Reminded to call for an overnight weight gain of >2 pounds or a weekly weight gain of >5 pounds - not adding salt to food and read food labels. Reviewed the importance of keeping daily sodium intake to 2000mg  daily. - Avoid Ibuprofen products.

## 2024-07-30 NOTE — Assessment & Plan Note (Signed)
 Chronic.  Controlled.  Continue with current medication regimen.  Labs ordered today.  Return to clinic in 6 months for reevaluation.  Call sooner if concerns arise.  ? ?

## 2024-07-30 NOTE — Progress Notes (Signed)
 BP (!) 148/78 (BP Location: Right Arm, Cuff Size: Normal)   Pulse 88   Temp 98.7 F (37.1 C) (Oral)   Ht 5' 10 (1.778 m)   Wt 225 lb 12.8 oz (102.4 kg)   SpO2 95%   BMI 32.40 kg/m    Subjective:    Patient ID: Jose [name]., male    DOB: 09/22/1971, 53 y.o.   MRN: [id number]  HPI: Jose [name]. is a 53 y.o. male  Chief Complaint  Patient presents with   Diabetes   Hyperlipidemia   Hypertension   HYPERTENSION / HYPERLIPIDEMIA/HF Patient has seen Cardiology recently.  Started on Warfarin for anticoagulation.  Has been attending INR check appts.   He continues to have ongoing exertional SOB.  PFTs completed and showed severe COPD.  He does have NYHA class III symptoms. He appears euvolemic at visit today.  Does not weigh regularly at home. Satisfied with current treatment? yes Duration of hypertension: years BP monitoring frequency: not checking BP range:  BP medication side effects: no Past BP meds: Lasix , amlodipine , carvedilol , and lisinopril  Duration of hyperlipidemia: years Cholesterol medication side effects: no Cholesterol supplements: none Past cholesterol medications: rosuvastatin  (crestor ) Medication compliance: excellent compliance Aspirin : no Recent stressors: no Recurrent headaches: no Visual changes: no Palpitations: no Dyspnea: yes- doing okay as long has he doesn't over do it.   Chest pain: no Lower extremity edema: no Dizzy/lightheaded: no  ALCOHOL  USE Drinking about a half a pint a day.  Sometimes he doesn't even drink a half a pint per day.    COPD/OSA Now followed by Pulmonology.  Only has SOB when he exerts himself.  On Advair and Spiriva .  Using BIPAP.  COPD status: controlled Satisfied with current treatment?: no Oxygen  use: no Dyspnea frequency: with exertion Cough frequency: no Rescue inhaler frequency:   Limitation of activity: yes Productive cough:  Last Spirometry:  Pneumovax: Up to Date Influenza: Up to Date Still smokes 2  cigars daily.        Relevant past medical, surgical, family and social history reviewed and updated as indicated. Interim medical history since our last visit reviewed. Allergies and medications reviewed and updated.  Review of Systems  Eyes:  Negative for visual disturbance.  Respiratory:  Positive for shortness of breath. Negative for chest tightness.   Cardiovascular:  Negative for chest pain, palpitations and leg swelling.  Neurological:  Negative for dizziness, light-headedness and headaches.    Per HPI unless specifically indicated above     Objective:    BP (!) 148/78 (BP Location: Right Arm, Cuff Size: Normal)   Pulse 88   Temp 98.7 F (37.1 C) (Oral)   Ht 5' 10 (1.778 m)   Wt 225 lb 12.8 oz (102.4 kg)   SpO2 95%   BMI 32.40 kg/m   Wt Readings from Last 3 Encounters:  07/30/24 225 lb 12.8 oz (102.4 kg)  06/19/24 226 lb (102.5 kg)  06/19/24 226 lb (102.5 kg)    Physical Exam Vitals and nursing note reviewed.  Constitutional:      General: He is not in acute distress.    Appearance: Normal appearance. He is not ill-appearing, toxic-appearing or diaphoretic.  HENT:     Head: Normocephalic.     Right Ear: External ear normal.     Left Ear: External ear normal.     Nose: Nose normal. No congestion or rhinorrhea.     Mouth/Throat:     Mouth: Mucous membranes are moist.  Eyes:     General:        Right eye: No discharge.        Left eye: No discharge.     Extraocular Movements: Extraocular movements intact.     Conjunctiva/sclera: Conjunctivae normal.     Pupils: Pupils are equal, round, and reactive to light.  Cardiovascular:     Rate and Rhythm: Normal rate. Rhythm irregular.     Heart sounds: No murmur heard. Pulmonary:     Effort: Pulmonary effort is normal. No respiratory distress.     Breath sounds: Wheezing present. No rhonchi or rales.  Abdominal:     General: Abdomen is flat. Bowel sounds are normal.  Musculoskeletal:     Cervical back:  Normal range of motion and neck supple.  Skin:    General: Skin is warm and dry.     Capillary Refill: Capillary refill takes less than 2 seconds.  Neurological:     General: No focal deficit present.     Mental Status: He is alert and oriented to person, place, and time.  Psychiatric:        Mood and Affect: Mood normal.        Behavior: Behavior normal.        Thought Content: Thought content normal.        Judgment: Judgment normal.     Results for orders placed or performed in visit on 07/24/24  POCT INR   Collection Time: 07/24/24 11:28 AM  Result Value Ref Range   INR 1.8 (A) 2.0 - 3.0   POC INR        Assessment & Plan:   Problem List Items Addressed This Visit       Cardiovascular and Mediastinum   Hypertension   Chronic, controlled Appears managed with Losartan  12.5 PO at bedtime, lasix  40 mg PO every day, spironolactone  12.5 mg PO every day, Metoprolol  25 mg PO BID Continue to follow with Cardiology - medications are managed by specialty  Follow up in 6 months or sooner if concerns arise       Chronic diastolic CHF (congestive heart failure) (HCC)   Chronic, controlled Appears managed with Jardiance  10 mg PO every day, Lasix  40 mg PO every day  Cardiology appears to be monitoring and managing medications Appears Euvolemic in office today.  Continue current regimen and Cardiology follow ups Follow up in 6 months or sooner if concerns arise   - Reminded to call for an overnight weight gain of >2 pounds or a weekly weight gain of >5 pounds - not adding salt to food and read food labels. Reviewed the importance of keeping daily sodium intake to 2000mg  daily. - Avoid Ibuprofen products.      Persistent atrial fibrillation (HCC)   Chronic.  Controlled.  Continue with current medication regimen of Metoprolol  and Eliquis .  Labs ordered today.  Return to clinic in 6 months for reevaluation.  Call sooner if concerns arise.          Respiratory   COPD (chronic  obstructive pulmonary disease) (HCC) - Primary   Chronic, ongoing  Patient is followed by Pulmonology  Currently on Breztri . He is using Albuterol  inhaler about 2 times per day - unsure of neb use  Diagnosed with OSA- did not tolerate CPAP. Currently trying to use BIPAP Continue current regimen for now, will collaborate with Pulm for management  Encouraged smoking cessation. Follow up in 6 months or sooner if concerns arise  Relevant Medications   albuterol  (VENTOLIN  HFA) 108 (90 Base) MCG/ACT inhaler     Hematopoietic and Hemostatic   Thrombocytopenia   Secondary to alcohol  use.  CBC checked at visit today.        Other   Seizures (HCC)   Chronic.  Secondary to alcoholism.  Counseled patient on alcohol  cessation.  Discussed community resources available.  Continue with Tegretol .  Levels checked at visit today.  Continue to follow up with Neurology. He was told her is not able to drive for 6 months.        Alcohol  abuse   Chronic, ongoing Continues to drink about 1/2 pint every two days Reviewed that alcohol  cessation can be dangerous and can lead to severe health issues if not supervised  Recommend he continues very gradual reduction and has close follow up with PCP and neurology to help prevent withdrawal complications Follow up in 6 months or sooner if concerns arise       Tobacco dependence   Chronic.  Still smoking 2 cigars per day.        Hypercholesterolemia   Chronic, controlled. Appears controlled with current regimen comprised of rosuvastatin  20 mg PO every day  Continue current regimen  Recheck lipid panel reviewed from Cardiology visit.  Follow up in 6 months or sooner if concerns arise       Relevant Orders   Lipid panel   Iron  deficiency anemia due to chronic blood loss   Chronic.  Controlled.  Continue with current medication regimen.  Labs ordered today.  Return to clinic in 6 months for reevaluation.  Call sooner if concerns arise.         Relevant Medications   ferrous sulfate  (SV IRON ) 325 (65 FE) MG tablet   folic acid  (FOLVITE ) 1 MG tablet   Other Relevant Orders   CBC With Diff/Platelet   Prediabetes   Chronic.  Controlled.  Continue with current medication regimen.  Labs ordered today.  Return to clinic in 6 months for reevaluation.  Call sooner if concerns arise.        Relevant Orders   Comprehensive metabolic panel with GFR   HgB J8r   Other Visit Diagnoses       Need for shingles vaccine       Relevant Orders   Zoster Recombinant (Shingrix  ) (Completed)     Need for influenza vaccination       Relevant Orders   Flu vaccine trivalent PF, 6mos and older(Flulaval,Afluria,Fluarix,Fluzone) (Completed)           Follow up plan: Return in about 6 months (around 01/27/2025) for HTN, HLD, DM2 FU.

## 2024-07-30 NOTE — Assessment & Plan Note (Signed)
Chronic.  Secondary to alcoholism.  Counseled patient on alcohol cessation.  Discussed community resources available.  Continue with Tegretol.  Levels checked at visit today.  Continue to follow up with Neurology. He was told her is not able to drive for 6 months.

## 2024-07-31 ENCOUNTER — Ambulatory Visit: Payer: Self-pay | Admitting: Nurse Practitioner

## 2024-07-31 DIAGNOSIS — Z1331 Encounter for screening for depression: Secondary | ICD-10-CM | POA: Diagnosis not present

## 2024-07-31 DIAGNOSIS — G40909 Epilepsy, unspecified, not intractable, without status epilepticus: Secondary | ICD-10-CM | POA: Diagnosis not present

## 2024-07-31 DIAGNOSIS — F1090 Alcohol use, unspecified, uncomplicated: Secondary | ICD-10-CM | POA: Diagnosis not present

## 2024-07-31 LAB — COMPREHENSIVE METABOLIC PANEL WITH GFR
ALT: 9 IU/L (ref 0–44)
AST: 18 IU/L (ref 0–40)
Albumin: 4.3 g/dL (ref 3.8–4.9)
Alkaline Phosphatase: 89 IU/L (ref 47–123)
BUN/Creatinine Ratio: 22 — ABNORMAL HIGH (ref 9–20)
BUN: 19 mg/dL (ref 6–24)
Bilirubin Total: <0.2 mg/dL (ref 0.0–1.2)
CO2: 22 mmol/L (ref 20–29)
Calcium: 10 mg/dL (ref 8.7–10.2)
Chloride: 99 mmol/L (ref 96–106)
Creatinine, Ser: 0.85 mg/dL (ref 0.76–1.27)
Globulin, Total: 2.8 g/dL (ref 1.5–4.5)
Glucose: 90 mg/dL (ref 70–99)
Potassium: 4.1 mmol/L (ref 3.5–5.2)
Sodium: 140 mmol/L (ref 134–144)
Total Protein: 7.1 g/dL (ref 6.0–8.5)
eGFR: 103 mL/min/1.73 (ref >59)

## 2024-07-31 LAB — CBC WITH DIFF/PLATELET
Basophils Absolute: 0.1 x10E3/uL (ref 0.0–0.2)
Basos: 1 %
EOS (ABSOLUTE): 0.1 x10E3/uL (ref 0.0–0.4)
Eos: 2 %
Hematocrit: 36 % — ABNORMAL LOW (ref 37.5–51.0)
Hemoglobin: 11.8 g/dL — ABNORMAL LOW (ref 13.0–17.7)
Immature Grans (Abs): 0 x10E3/uL (ref 0.0–0.1)
Immature Granulocytes: 0 %
Lymphocytes Absolute: 1.4 x10E3/uL (ref 0.7–3.1)
Lymphs: 22 %
MCH: 30.8 pg (ref 26.6–33.0)
MCHC: 32.8 g/dL (ref 31.5–35.7)
MCV: 94 fL (ref 79–97)
Monocytes Absolute: 0.9 x10E3/uL (ref 0.1–0.9)
Monocytes: 14 %
Neutrophils Absolute: 3.9 x10E3/uL (ref 1.4–7.0)
Neutrophils: 60 %
Platelets: 172 x10E3/uL (ref 150–450)
RBC: 3.83 x10E6/uL — ABNORMAL LOW (ref 4.14–5.80)
RDW: 14.4 % (ref 11.6–15.4)
WBC: 6.4 x10E3/uL (ref 3.4–10.8)

## 2024-07-31 LAB — LIPID PANEL
Chol/HDL Ratio: 3.4 ratio (ref 0.0–5.0)
Cholesterol, Total: 196 mg/dL (ref 100–199)
HDL: 57 mg/dL (ref >39)
LDL Chol Calc (NIH): 110 mg/dL — ABNORMAL HIGH (ref 0–99)
Triglycerides: 168 mg/dL — ABNORMAL HIGH (ref 0–149)
VLDL Cholesterol Cal: 29 mg/dL (ref 5–40)

## 2024-07-31 LAB — HEMOGLOBIN A1C
Est. average glucose Bld gHb Est-mCnc: 114 mg/dL
Hgb A1c MFr Bld: 5.6 % (ref 4.8–5.6)

## 2024-08-14 ENCOUNTER — Ambulatory Visit: Attending: Internal Medicine

## 2024-08-14 DIAGNOSIS — Z5181 Encounter for therapeutic drug level monitoring: Secondary | ICD-10-CM | POA: Insufficient documentation

## 2024-08-14 DIAGNOSIS — I4819 Other persistent atrial fibrillation: Secondary | ICD-10-CM | POA: Diagnosis not present

## 2024-08-14 LAB — POCT INR: INR: 3.4 — AB (ref 2.0–3.0)

## 2024-08-14 NOTE — Patient Instructions (Signed)
 Hold tonight only then Continue 2 TABLETS DAILY, EXCEPT 1 TABLET ON SUNDAY, WEDNESDAY AND SATURDAY.  INR in 3 weeks. [phone]

## 2024-09-04 ENCOUNTER — Ambulatory Visit: Attending: Internal Medicine

## 2024-09-04 DIAGNOSIS — I4819 Other persistent atrial fibrillation: Secondary | ICD-10-CM | POA: Insufficient documentation

## 2024-09-04 DIAGNOSIS — Z5181 Encounter for therapeutic drug level monitoring: Secondary | ICD-10-CM | POA: Insufficient documentation

## 2024-09-04 LAB — POCT INR: INR: 3.3 — AB (ref 2.0–3.0)

## 2024-09-04 NOTE — Patient Instructions (Signed)
 Decrease to  2 TABLETS DAILY, EXCEPT 1 TABLET ON SUNDAY, MONDAY, WEDNESDAY AND SATURDAY.  INR in 4 weeks. [phone]

## 2024-09-18 DIAGNOSIS — Z419 Encounter for procedure for purposes other than remedying health state, unspecified: Secondary | ICD-10-CM | POA: Diagnosis not present

## 2024-09-23 ENCOUNTER — Other Ambulatory Visit: Payer: Self-pay | Admitting: Nurse Practitioner

## 2024-09-24 ENCOUNTER — Ambulatory Visit (INDEPENDENT_AMBULATORY_CARE_PROVIDER_SITE_OTHER): Admitting: Pulmonary Disease

## 2024-09-24 ENCOUNTER — Encounter: Payer: Self-pay | Admitting: Pulmonary Disease

## 2024-09-24 VITALS — BP 140/96 | HR 64 | Temp 97.6°F | Ht 70.0 in | Wt 228.0 lb

## 2024-09-24 DIAGNOSIS — F17210 Nicotine dependence, cigarettes, uncomplicated: Secondary | ICD-10-CM | POA: Diagnosis not present

## 2024-09-24 DIAGNOSIS — G4733 Obstructive sleep apnea (adult) (pediatric): Secondary | ICD-10-CM

## 2024-09-24 DIAGNOSIS — K146 Glossodynia: Secondary | ICD-10-CM

## 2024-09-24 DIAGNOSIS — J449 Chronic obstructive pulmonary disease, unspecified: Secondary | ICD-10-CM

## 2024-09-24 DIAGNOSIS — F1010 Alcohol abuse, uncomplicated: Secondary | ICD-10-CM | POA: Diagnosis not present

## 2024-09-24 DIAGNOSIS — F17200 Nicotine dependence, unspecified, uncomplicated: Secondary | ICD-10-CM

## 2024-09-24 DIAGNOSIS — G4734 Idiopathic sleep related nonobstructive alveolar hypoventilation: Secondary | ICD-10-CM

## 2024-09-24 DIAGNOSIS — K148 Other diseases of tongue: Secondary | ICD-10-CM

## 2024-09-24 NOTE — Patient Instructions (Signed)
 VISIT SUMMARY:  Today, you were seen for the management of your respiratory conditions, including COPD and obstructive sleep apnea. We also discussed your ongoing tobacco and alcohol  use, and you mentioned experiencing pain on one side of your tongue that radiates to your ear and throat.  YOUR PLAN:  -CHRONIC OBSTRUCTIVE PULMONARY DISEASE (COPD): COPD is a chronic lung disease that makes it hard to breathe. You will continue using nasal oxygen  therapy and your Spiriva  inhaler has been refilled. It is important to stop smoking to prevent further damage to your lungs.  -OBSTRUCTIVE SLEEP APNEA: Obstructive sleep apnea is a condition where your breathing stops and starts during sleep. You will continue using nasal oxygen  therapy as you find it more comfortable than BiPAP.  -TOBACCO USE DISORDER: Tobacco use disorder is the dependence on tobacco products despite knowing the health risks. You are encouraged to quit smoking to improve your overall health.  -ORAL PAIN AND SWELLING, LEFT SIDE OF TONGUE: You have painful swelling on the left side of your tongue that extends to your ear and throat, possibly due to trauma or dental issues.  Recommend evaluation by a dentist for evaluation, and if needed, we will consider referring you to an ENT specialist.  INSTRUCTIONS:  Please follow up with a dentist for the evaluation of your tongue pain. If the dental evaluation does not provide answers, we will consider a referral to an ENT specialist. Continue using your nasal oxygen  therapy and Spiriva  inhaler as prescribed. It is highly recommended that you quit smoking to improve your respiratory health.

## 2024-09-24 NOTE — Progress Notes (Unsigned)
 Subjective:    Patient ID: Jose [name]., male    DOB: Feb 07, 1971, 53 y.o.   MRN: [id number]  Patient Care Team: Melvin Pao, NP as PCP - General Merlynn Lyle CROME, LCSW as Social Worker (Licensed Clinical Social Worker) Tamea Dedra CROME, MD as Consulting Physician (Pulmonary Disease) Malachy Comer GAILS, NP as Nurse Practitioner (Nurse Practitioner)  Chief Complaint  Patient presents with   COPD    Shortness of breath on exertion. Cough and wheezing.     BACKGROUND/INTERVAL:Patient is a 53 year old current cigar smoker with 33-pack-year history of cigarette smoking who presents for follow-up on the issue of shortness of breath.  He has stage III COPD.  Currently maintained on Advair discus and Spiriva  uses albuterol  as well.  Last visit here was on 19 Mar 2024 with me.  No hospitalizations since his last visit. No exacerbations since then.  Since his last visit was diagnosed with sleep apnea by study ordered by cardiology patient has been reluctant to get BiPAP, discussed importance of same.   HPI    DATA 04/27/2022 PFTs: FEV1 1.43 L or 43% predicted, FVC 2.39 L or 57% predicted, FEV1/FVC 60%, no bronchodilator response.  There is significant air trapping.  Mild restrictive physiology on the basis of obesity (ERV 37%) diffusion capacity moderately reduced.  Consistent with severe obstructive defect with concomitant mild restrictive defect on the basis of obesity. 03/14/2023 PFTs: FEV1 1.10 L or 28% predicted, FVC 2.34 L or 46% predicted, FEV1/FVC 47%, no bronchodilator response.  Patient unable to perform DLCO due to poor understanding of instructions. 08/16/2023 LDCT chest: Centrilobular and paraseptal emphysema.  Right lung nodule identified.  Patient's pulmonary nodules of mass.  Enlargement of pleural tract/main pulmonary arteries suggestive of pulmonary arterial hypertension.  RADS 2.  Benign appearance and behavior.  Review of Systems A 10 point review of systems was  performed and it is as noted above otherwise negative.   Patient Active Problem List   Diagnosis Date Noted   Nocturnal hypoxemia 06/30/2023   Upper GI bleed 04/26/2023   Blood loss anemia 04/26/2023   AKI (acute kidney injury) 04/26/2023   Hyperkalemia 04/26/2023   Alcohol  use disorder 04/26/2023   Persistent atrial fibrillation (HCC) 12/19/2022   Seizure disorder (HCC) 11/11/2022   Acute metabolic encephalopathy 11/11/2022   Thrombocytopenia 11/11/2022   Elevated LFTs 11/11/2022   Atrial fibrillation with RVR (HCC) 11/11/2022   Right knee pain 11/11/2022   Hypomagnesemia 11/11/2022   Prediabetes 08/24/2022   Acute CHF (congestive heart failure) (HCC) 12/21/2021   Alcoholic intoxication without complication 02/22/2021   Chronic diastolic CHF (congestive heart failure) (HCC) 09/08/2020   Iron  deficiency anemia due to chronic blood loss    Family history of colon cancer 06/28/2016   Seizures (HCC) 04/30/2015   Hypertension 04/30/2015   Alcohol  abuse 04/30/2015   Epilepsy (HCC) 04/30/2015   Tobacco dependence 04/30/2015   COPD (chronic obstructive pulmonary disease) (HCC) 04/30/2015   Lumbago 04/30/2015   Hypercholesterolemia 04/30/2015   Chronic pain 04/30/2015   Neck pain 04/22/2013    Social History   Tobacco Use   Smoking status: Every Day    Average packs/day: 1.5 packs/day for 30.0 years (45.0 ttl pk-yrs)    Types: Cigarettes, Cigars    Start date: 18    Last attempt to quit: 2021    Years since quitting: 4.8   Smokeless tobacco: Former   Tobacco comments:    1.5 cigars daily- khj 06/19/2024    Quit smoking cigarettes  3 years ago.  Smokes cigarettes since he was 53 years old.  Substance Use Topics   Alcohol  use: Yes    Alcohol /week: 28.0 standard drinks of alcohol     Types: 7 Cans of beer, 21 Shots of liquor per week    Comment: pint of liquor per day    No Known Allergies  Current Meds  Medication Sig   albuterol  (VENTOLIN  HFA) 108 (90 Base) MCG/ACT  inhaler Inhale 2 puffs into the lungs every 6 (six) hours as needed for wheezing or shortness of breath.   carbamazepine  (TEGRETOL ) 200 MG tablet Take 1 tablet (200 mg total) by mouth 2 (two) times daily.   clotrimazole -betamethasone  (LOTRISONE ) cream Apply 1 Application topically daily.   empagliflozin  (JARDIANCE ) 10 MG TABS tablet Take 1 tablet (10 mg total) by mouth daily before breakfast.   ferrous sulfate  (SV IRON ) 325 (65 FE) MG tablet Take 1 tablet (325 mg total) by mouth daily.   fluticasone -salmeterol (ADVAIR DISKUS) 100-50 MCG/ACT AEPB Inhale 1 puff into the lungs 2 (two) times daily.   folic acid  (FOLVITE ) 1 MG tablet Take 1 tablet (1 mg total) by mouth daily.   furosemide  (LASIX ) 40 MG tablet TAKE 1 TABLET BY MOUTH ONCE DAILY IN THE MORNING   losartan  (COZAAR ) 25 MG tablet Take 1 tablet (25 mg total) by mouth daily.   metoprolol  tartrate (LOPRESSOR ) 25 MG tablet Take 1 tablet (25 mg total) by mouth 2 (two) times daily.   potassium chloride  (KLOR-CON ) 10 MEQ tablet Take 20 mEq by mouth daily.   rosuvastatin  (CRESTOR ) 40 MG tablet Take 1 tablet (40 mg total) by mouth daily.   SPIRIVA  RESPIMAT 2.5 MCG/ACT AERS INHALE 2 SPRAY(S) BY MOUTH ONCE DAILY   spironolactone  (ALDACTONE ) 25 MG tablet Take 1 tablet (25 mg total) by mouth daily.   warfarin (COUMADIN ) 5 MG tablet Take 1-2 tablets daily as instructed by the warfarin clinic    Immunization History  Administered Date(s) Administered   Influenza, Seasonal, Injecte, Preservative Fre 10/26/2023, 07/30/2024   Influenza,inj,Quad PF,6+ Mos 08/17/2022   Influenza-Unspecified 08/22/2019   PFIZER(Purple Top)SARS-COV-2 Vaccination 09/14/2020, 10/19/2020   PNEUMOCOCCAL CONJUGATE-20 08/26/2022   Zoster Recombinant(Shingrix ) 07/30/2024        Objective:     BP (!) 140/96   Pulse 64   Temp 97.6 F (36.4 C) (Temporal)   Ht 5' 10 (1.778 m)   Wt 228 lb (103.4 kg)   SpO2 96%   BMI 32.71 kg/m   SpO2: 96 %  GENERAL: HEAD:  Normocephalic, atraumatic.  EYES: Pupils equal, round, reactive to light.  No scleral icterus.  MOUTH:  NECK: Supple. No thyromegaly. Trachea midline. No JVD.  No adenopathy. PULMONARY: Good air entry bilaterally.  No adventitious sounds. CARDIOVASCULAR: S1 and S2. Regular rate and rhythm.  ABDOMEN: MUSCULOSKELETAL: No joint deformity, no clubbing, no edema.  NEUROLOGIC:  SKIN: Intact,warm,dry. PSYCH:        Assessment & Plan:   No diagnosis found.  No orders of the defined types were placed in this encounter.   No orders of the defined types were placed in this encounter.     Advised if symptoms do not improve or worsen, to please contact office for sooner follow up or seek emergency care.    I spent xxx minutes of dedicated to the care of this patient on the date of this encounter to include pre-visit review of records, face-to-face time with the patient discussing conditions above, post visit ordering of testing, clinical documentation with the  electronic health record, making appropriate referrals as documented, and communicating necessary findings to members of the patients care team.     C. Leita Sanders, MD Advanced [facility]    *This note was generated using voice recognition software/Dragon and/or AI transcription program.  Despite best efforts to proofread, errors can occur which can change the meaning. Any transcriptional errors that result from this process are unintentional and may not be fully corrected at the time of dictation.

## 2024-09-25 ENCOUNTER — Telehealth: Payer: Self-pay | Admitting: Acute Care

## 2024-09-25 ENCOUNTER — Other Ambulatory Visit: Payer: Self-pay

## 2024-09-25 DIAGNOSIS — Z122 Encounter for screening for malignant neoplasm of respiratory organs: Secondary | ICD-10-CM

## 2024-09-25 DIAGNOSIS — Z87891 Personal history of nicotine dependence: Secondary | ICD-10-CM

## 2024-09-25 NOTE — Telephone Encounter (Signed)
 Left VM and letter mailed to home to call for annual LDCT appt.  Call direct at [phone]. New order placed for annual LDCT

## 2024-10-02 ENCOUNTER — Ambulatory Visit: Attending: Internal Medicine

## 2024-10-02 DIAGNOSIS — Z5181 Encounter for therapeutic drug level monitoring: Secondary | ICD-10-CM | POA: Insufficient documentation

## 2024-10-02 DIAGNOSIS — I4819 Other persistent atrial fibrillation: Secondary | ICD-10-CM | POA: Insufficient documentation

## 2024-10-02 LAB — POCT INR: INR: 3 (ref 2.0–3.0)

## 2024-10-02 NOTE — Patient Instructions (Signed)
 Continue 1 TABLET DAILY, EXCEPT 2 TABLETS ON MONDAY, WEDNESDAY AND FRIDAY. INR in 4 weeks. [phone]

## 2024-10-06 ENCOUNTER — Other Ambulatory Visit: Payer: Self-pay | Admitting: Cardiology

## 2024-10-14 ENCOUNTER — Ambulatory Visit: Admitting: Family

## 2024-10-18 DIAGNOSIS — Z419 Encounter for procedure for purposes other than remedying health state, unspecified: Secondary | ICD-10-CM | POA: Diagnosis not present

## 2024-10-30 ENCOUNTER — Ambulatory Visit: Attending: Internal Medicine

## 2024-10-30 DIAGNOSIS — Z5181 Encounter for therapeutic drug level monitoring: Secondary | ICD-10-CM | POA: Diagnosis not present

## 2024-10-30 DIAGNOSIS — I4819 Other persistent atrial fibrillation: Secondary | ICD-10-CM | POA: Insufficient documentation

## 2024-10-30 LAB — POCT INR: INR: 2.4 (ref 2.0–3.0)

## 2024-10-30 NOTE — Patient Instructions (Signed)
 Continue 1 TABLET DAILY, EXCEPT 2 TABLETS ON MONDAY, WEDNESDAY AND FRIDAY. INR in 4 weeks. [phone]

## 2024-11-05 ENCOUNTER — Telehealth: Payer: Self-pay | Admitting: Family

## 2024-11-05 NOTE — Telephone Encounter (Signed)
 Called to confirm/remind patient of their appointment at [facility] on 11/06/24.   Appointment:   [x] Confirmed  [] Left mess   [] No answer/No voice mail  [] VM Full/unable to leave message  [] Phone not in service  Patient reminded to bring all medications and/or complete list.  Confirmed patient has transportation. Gave directions, instructed to utilize valet parking.

## 2024-11-06 ENCOUNTER — Ambulatory Visit: Attending: Family | Admitting: Family

## 2024-11-06 ENCOUNTER — Encounter: Payer: Self-pay | Admitting: Family

## 2024-11-06 VITALS — BP 142/93 | HR 93 | Wt 222.2 lb

## 2024-11-06 DIAGNOSIS — I480 Paroxysmal atrial fibrillation: Secondary | ICD-10-CM | POA: Diagnosis not present

## 2024-11-06 DIAGNOSIS — G4733 Obstructive sleep apnea (adult) (pediatric): Secondary | ICD-10-CM | POA: Insufficient documentation

## 2024-11-06 DIAGNOSIS — Z7951 Long term (current) use of inhaled steroids: Secondary | ICD-10-CM | POA: Insufficient documentation

## 2024-11-06 DIAGNOSIS — Z7901 Long term (current) use of anticoagulants: Secondary | ICD-10-CM | POA: Insufficient documentation

## 2024-11-06 DIAGNOSIS — E785 Hyperlipidemia, unspecified: Secondary | ICD-10-CM | POA: Diagnosis not present

## 2024-11-06 DIAGNOSIS — D696 Thrombocytopenia, unspecified: Secondary | ICD-10-CM | POA: Insufficient documentation

## 2024-11-06 DIAGNOSIS — F1010 Alcohol abuse, uncomplicated: Secondary | ICD-10-CM | POA: Diagnosis not present

## 2024-11-06 DIAGNOSIS — I5032 Chronic diastolic (congestive) heart failure: Secondary | ICD-10-CM | POA: Diagnosis not present

## 2024-11-06 DIAGNOSIS — J449 Chronic obstructive pulmonary disease, unspecified: Secondary | ICD-10-CM | POA: Insufficient documentation

## 2024-11-06 DIAGNOSIS — Z7984 Long term (current) use of oral hypoglycemic drugs: Secondary | ICD-10-CM | POA: Insufficient documentation

## 2024-11-06 DIAGNOSIS — I110 Hypertensive heart disease with heart failure: Secondary | ICD-10-CM | POA: Insufficient documentation

## 2024-11-06 DIAGNOSIS — R002 Palpitations: Secondary | ICD-10-CM | POA: Insufficient documentation

## 2024-11-06 DIAGNOSIS — I4819 Other persistent atrial fibrillation: Secondary | ICD-10-CM | POA: Insufficient documentation

## 2024-11-06 DIAGNOSIS — Z79899 Other long term (current) drug therapy: Secondary | ICD-10-CM | POA: Diagnosis not present

## 2024-11-06 DIAGNOSIS — Z9981 Dependence on supplemental oxygen: Secondary | ICD-10-CM | POA: Diagnosis not present

## 2024-11-06 DIAGNOSIS — E782 Mixed hyperlipidemia: Secondary | ICD-10-CM | POA: Diagnosis not present

## 2024-11-06 DIAGNOSIS — F17290 Nicotine dependence, other tobacco product, uncomplicated: Secondary | ICD-10-CM | POA: Diagnosis not present

## 2024-11-06 DIAGNOSIS — F17210 Nicotine dependence, cigarettes, uncomplicated: Secondary | ICD-10-CM | POA: Diagnosis not present

## 2024-11-06 DIAGNOSIS — D6959 Other secondary thrombocytopenia: Secondary | ICD-10-CM | POA: Diagnosis not present

## 2024-11-06 LAB — CBC
Hematocrit: 37.8 % (ref 37.5–51.0)
Hemoglobin: 12.2 g/dL — ABNORMAL LOW (ref 13.0–17.7)
MCH: 30.6 pg (ref 26.6–33.0)
MCHC: 32.3 g/dL (ref 31.5–35.7)
MCV: 95 fL (ref 79–97)
Platelets: 177 x10E3/uL (ref 150–450)
RBC: 3.99 x10E6/uL — ABNORMAL LOW (ref 4.14–5.80)
RDW: 13.4 % (ref 11.6–15.4)
WBC: 5.8 x10E3/uL (ref 3.4–10.8)

## 2024-11-06 LAB — BASIC METABOLIC PANEL WITH GFR
BUN/Creatinine Ratio: 27 — ABNORMAL HIGH (ref 9–20)
BUN: 20 mg/dL (ref 6–24)
CO2: 26 mmol/L (ref 20–29)
Calcium: 10.5 mg/dL — ABNORMAL HIGH (ref 8.7–10.2)
Chloride: 99 mmol/L (ref 96–106)
Creatinine, Ser: 0.75 mg/dL — ABNORMAL LOW (ref 0.76–1.27)
Glucose: 98 mg/dL (ref 70–99)
Potassium: 4.6 mmol/L (ref 3.5–5.2)
Sodium: 137 mmol/L (ref 134–144)
eGFR: 107 mL/min/1.73

## 2024-11-06 NOTE — Patient Instructions (Signed)
 It was great to see you today!  Labs done today, your results will be available in MyChart, we will contact you for abnormal readings.   Follow Up: 4 months with Ellouise Class, NP.

## 2024-11-06 NOTE — Progress Notes (Signed)
 "  ADVANCED HEART FAILURE CLINIC NOTE  Referring Physician: Melvin Pao, NP  Primary Care: Melvin Pao, NP HF Cardiology: Dr. Rolan  Chief Complaint: shortness of breath   HPI: Jose [name]. is a 53 y.o. male with hypertension, hyperlipidemia, COPD, significant alcohol  abuse, history of seizures, persistent atrial fibrillation.  Patient was admitted in 1/24 with fevers chills cough and fatigue.  He presented hypotensive to 60s/40s with new onset atrial fibrillation with RVR. TTE during admit with LVEF of 50-55%, moderate biatrial enlargement, normal RV. He was seen by neurology for possible alcohol  withdrawal related seizures and continued on home tegretol  (undetectable levels on admit). For his atrial fibrillation he was discharged home on cardizem  & lopressor . Anticoagulation was not started due to concern for continued alcohol  abuse and thrombocytopenia.   Patient was ultimately started on warfarin and had TEE-guided DCCV in 3/24.  TEE showed EF 55%, normal RV, mild LAE.    PFTs in 5/24 showed severe COPD. Cardiolite in 5/24 showed no ischemia or infarction.   Patient was admitted in 6/24 with melena and AKI, creatinine up to 2.29 and hgb down to 7.9.  He was transfused.  EGD showed a single AVM, nonbleeding, treated with APC. Colonoscopy showed diverticulosis.  Due to low BP, diltiazem  CD and Entresto  were stopped.  Warfarin was held while in the hospital but was restarted at discharge.   Echo in 5/25 showed EF 50-55%, normal RV, 4.5 cm aortic root.   He presents today, with his mom, for a HF follow-up visit with a chief complaint of minimal shortness of breath. Has occasional palpitations. Sleeping ok on 2-3 pillows. Wearing oxygen  at 2L at bedtime. No bipap & not interested in this. Denies chest pain, dizziness, edema or bleeding.     Still smoking at least 2 cigars/day.  He is still drinking at least 1/2 pint liquor on most days.  Nothing has changed in terms of substance  use.  ECG not done  Labs (1/24): K 3.7, creatinine 0.84, plts 345, hgb 12.3 Labs (2/24): K 4.6, creatinine 1.49, BNP 58 Labs (6/24): K 3.8, creatinine 2.29 => 0.82, hgb 11.4 => 7.9, plts 130 Labs (9/24): LDL 95, hgb 12.6, K 3.7, creatinine 0.72 Labs (11/24): K 4, creatinine 0.7 Labs (3/25): K 4.7, creatinine 0.76, LDL 138 Labs (5/25): LDL 90, BNP 38, K 4, creatinine 0.71 Labs (8/25): K 3.9, creatinine 0.75, BNP 11.7 Labs (9/25): K 4.1, creatinine 0.85, LFT's normal, LDL 110, Hg 11.8, A1c 5.6% Labs (10/25): INR 3.4  ROS: All systems negative except what is listed in HPI, PMH and Problem List  Past Medical History:  Diagnosis Date   Alcohol  abuse    Cervicalgia    CHF (congestive heart failure) (HCC)    COPD (chronic obstructive pulmonary disease) (HCC)    Hyperlipidemia    Hypertension    Seizures (HCC)     Current Outpatient Medications  Medication Sig Dispense Refill   albuterol  (VENTOLIN  HFA) 108 (90 Base) MCG/ACT inhaler Inhale 2 puffs into the lungs every 6 (six) hours as needed for wheezing or shortness of breath. 8 g 2   carbamazepine  (TEGRETOL ) 200 MG tablet Take 1 tablet (200 mg total) by mouth 2 (two) times daily. 180 tablet 1   clotrimazole -betamethasone  (LOTRISONE ) cream Apply 1 Application topically daily. 30 g 0   ferrous sulfate  (SV IRON ) 325 (65 FE) MG tablet Take 1 tablet (325 mg total) by mouth daily. 90 tablet 1   fluticasone -salmeterol (ADVAIR DISKUS) 100-50 MCG/ACT AEPB  Inhale 1 puff into the lungs 2 (two) times daily. 60 each 11   folic acid  (FOLVITE ) 1 MG tablet Take 1 tablet (1 mg total) by mouth daily. 90 tablet 1   furosemide  (LASIX ) 40 MG tablet TAKE 1 TABLET BY MOUTH ONCE DAILY IN THE MORNING 90 tablet 3   JARDIANCE  10 MG TABS tablet TAKE 1 TABLET BY MOUTH ONCE DAILY BEFORE BREAKFAST 90 tablet 0   losartan  (COZAAR ) 25 MG tablet Take 1 tablet (25 mg total) by mouth daily. 90 tablet 3   metoprolol  tartrate (LOPRESSOR ) 25 MG tablet Take 1 tablet (25 mg  total) by mouth 2 (two) times daily. 180 tablet 3   potassium chloride  (KLOR-CON ) 10 MEQ tablet Take 20 mEq by mouth daily.     rosuvastatin  (CRESTOR ) 40 MG tablet Take 1 tablet (40 mg total) by mouth daily. 90 tablet 3   SPIRIVA  RESPIMAT 2.5 MCG/ACT AERS INHALE 2 SPRAY(S) BY MOUTH ONCE DAILY 4 g 0   spironolactone  (ALDACTONE ) 25 MG tablet Take 1 tablet (25 mg total) by mouth daily. 90 tablet 3   warfarin (COUMADIN ) 5 MG tablet Take 1-2 tablets daily as instructed by the warfarin clinic 100 tablet 1   No current facility-administered medications for this visit.    No Known Allergies    Social History   Socioeconomic History   Marital status: Widowed    Spouse name: Not on file   Number of children: Not on file   Years of education: Not on file   Highest education level: Not on file  Occupational History   Not on file  Tobacco Use   Smoking status: Every Day    Average packs/day: 1.5 packs/day for 30.0 years (45.0 ttl pk-yrs)    Types: Cigarettes, Cigars    Start date: 48    Last attempt to quit: 2021    Years since quitting: 5.0   Smokeless tobacco: Former   Tobacco comments:    1.5 cigars daily- khj 06/19/2024    Quit smoking cigarettes 3 years ago.  Smokes cigarettes since he was 53 years old.  Vaping Use   Vaping status: Never Used  Substance and Sexual Activity   Alcohol  use: Yes    Alcohol /week: 28.0 standard drinks of alcohol     Types: 7 Cans of beer, 21 Shots of liquor per week    Comment: pint of liquor per day   Drug use: No    Types: Marijuana    Comment: quit back in the late 90's   Sexual activity: Yes  Other Topics Concern   Not on file  Social History Narrative   Lives with his Mother Jose [name]    Social Drivers of Health   Tobacco Use: High Risk (09/24/2024)   Patient History    Smoking Tobacco Use: Every Day    Smokeless Tobacco Use: Former    Passive Exposure: Not on Actuary Strain: Not on file  Food Insecurity: No Food  Insecurity (04/26/2023)   Hunger Vital Sign    Worried About Running Out of Food in the Last Year: Never true    Ran Out of Food in the Last Year: Never true  Transportation Needs: No Transportation Needs (04/26/2023)   PRAPARE - Administrator, Civil Service (Medical): No    Lack of Transportation (Non-Medical): No  Physical Activity: Not on file  Stress: Not on file  Social Connections: Not on file  Intimate Partner Violence: Not At Risk (04/26/2023)   Humiliation,  Afraid, Rape, and Kick questionnaire    Fear of Current or Ex-Partner: No    Emotionally Abused: No    Physically Abused: No    Sexually Abused: No  Depression (PHQ2-9): Low Risk (07/30/2024)   Depression (PHQ2-9)    PHQ-2 Score: 2  Alcohol  Screen: Not on file  Housing: Unknown (07/31/2024)   Received from [name]   Epic    Unable to Pay for Housing in the Last Year: Not on file    Number of Times Moved in the Last Year: Not on file    At any time in the past 12 months, were you homeless or living in a shelter (including now)?: No  Utilities: Not At Risk (04/26/2023)   [facility] Utilities    Threatened with loss of utilities: No  Health Literacy: Not on file      Family History  Problem Relation Age of Onset   Hypertension Mother    Diabetes Mother    Colon cancer Mother    Hypertension Father    Multiple sclerosis Father    Vitals:   11/06/24 0912  BP: (!) 142/93  Pulse: 93  SpO2: 93%  Weight: 222 lb 4 oz (100.8 kg)   Wt Readings from Last 3 Encounters:  11/06/24 222 lb 4 oz (100.8 kg)  09/24/24 228 lb (103.4 kg)  07/30/24 225 lb 12.8 oz (102.4 kg)   Lab Results  Component Value Date   CREATININE 0.85 07/30/2024   CREATININE 0.75 06/13/2024   CREATININE 0.71 04/04/2024     PHYSICAL EXAM:  General: Well appearing.  Cor: No JVD. Regular rhythm, rate.  Lungs: clear Abdomen: soft, nontender, nondistended. Extremities: no edema Neuro:. Affect  pleasant   ECHO: 11/11/22: LVEF 50-55%, normal RV function 11/05/21: LVEF 60-65%, normal RV function TEE (3/24): EF 55%, RV normal Echo (5/25): EF 50-55%, normal RV, 4.5 cm aortic root.   Cardiolite (5/24): EF 45% but visually looks normal, no ischemia/infarction   ASSESSMENT & PLAN:  1.  Chronic diastolic CHF: Echo in 1/24 with EF 50-55%, normal RV in setting of atrial fibrillation with RVR.  TEE in 3/24 with EF 55% and normal RV.  Cardiolite in 5/24 with no ischemia/infarction. Echo in 5/25 showed EF 50-55%, normal RV.  NYHA class II symptoms. Not volume overloaded on exam.  I suspect dyspnea is multifactorial with severe COPD playing a role.  Weight down 6 pounds. - Continue Lasix  40 mg daily.  BMET today - Continue Jardiance  10 mg daily.  - Continue spironolactone  25 daily.  2. Atrial fibrillation: Paroxysmal.  He was not started on anticoagulation during 1/24 admission due to ETOH abuse, thrombocytopenia, and concerns about fall risk and ability to take anticoagulation consistently. However, anticoagulation was later started and he was cardioverted in 3/24.  He has not had any falls.  He has had GI bleeding (episode in 6/24).  No overt bleeding recently.  Unfortunately, he is on Tegretol  so cannot take Eliquis , Xarelto, or Pradaxa.  GFR is too high for edoxaban.  He is, therefore, on warfarin. Still drinking ETOH.  - Continue warfarin INR goal to 2-2.5.  - Continue metoprolol  25 mg BID - If he is able to stop ETOH, will refer to EP for AF ablation. Watchman is also a consideration.     3. Alcohol  abuse: Long history of alcohol  abuse. He still drinks 1/2 pint liquor daily.   - Discussed imperative to continue cutting back ETOH. I do not think that he is interested  in cutting back.  4. Thrombocytopenia: ?Related to ETOH abuse.  Recent platelets have been higher.  - CBC today.  5. COPD: Severe obstruction on PFTs in 5/24, still smoking cigars. Suspect this contributes to his dyspnea.  - I  urged him to quit.  - Follows with pulmonology, last seen 11/25  6. Hyperlipidemia: Continue Crestor  40mg . LDL 07/30/24 was 110 7: OSA: - Sleep study done 06/25 + sleep apnea, bipap ordered but he will not wear it. Wearing oxygen  at 2L when asleep   Return in 4 months, sooner if needed.   I spent 30 minutes reviewing records, interviewing/ examing patient and managing plan/ orders.   Ellouise DELENA Class FNP-C 11/06/2024 "

## 2024-11-07 ENCOUNTER — Ambulatory Visit: Payer: Self-pay | Admitting: Family

## 2024-11-16 ENCOUNTER — Other Ambulatory Visit: Payer: Self-pay | Admitting: Cardiology

## 2024-11-16 DIAGNOSIS — I4819 Other persistent atrial fibrillation: Secondary | ICD-10-CM

## 2024-11-17 ENCOUNTER — Other Ambulatory Visit: Payer: Self-pay | Admitting: Cardiology

## 2024-11-26 ENCOUNTER — Other Ambulatory Visit: Payer: Self-pay | Admitting: Cardiology

## 2024-11-26 DIAGNOSIS — I4819 Other persistent atrial fibrillation: Secondary | ICD-10-CM

## 2024-11-27 ENCOUNTER — Ambulatory Visit

## 2024-11-27 DIAGNOSIS — I4819 Other persistent atrial fibrillation: Secondary | ICD-10-CM | POA: Insufficient documentation

## 2024-11-27 DIAGNOSIS — Z5181 Encounter for therapeutic drug level monitoring: Secondary | ICD-10-CM | POA: Diagnosis present

## 2024-11-27 LAB — POCT INR: INR: 3.8 — AB (ref 2.0–3.0)

## 2024-11-27 NOTE — Patient Instructions (Signed)
 Take only 1 tablet today then Continue 1 TABLET DAILY, EXCEPT 2 TABLETS ON MONDAY, WEDNESDAY AND FRIDAY. INR in 4 weeks. [phone]

## 2024-11-29 ENCOUNTER — Other Ambulatory Visit: Payer: Self-pay | Admitting: Nurse Practitioner

## 2024-12-25 ENCOUNTER — Ambulatory Visit

## 2025-01-21 ENCOUNTER — Ambulatory Visit: Admitting: Pulmonary Disease

## 2025-01-28 ENCOUNTER — Ambulatory Visit: Admitting: Nurse Practitioner

## 2025-03-04 ENCOUNTER — Ambulatory Visit: Admitting: Family

## 2025-03-06 ENCOUNTER — Ambulatory Visit: Admitting: Family
